# Patient Record
Sex: Female | Born: 1935
Health system: Southern US, Community
[De-identification: ages and names within clinical notes are randomized; demographics above are authoritative.]

## PROBLEM LIST (undated history)

## (undated) DIAGNOSIS — M545 Low back pain, unspecified: Secondary | ICD-10-CM

## (undated) DIAGNOSIS — I10 Essential (primary) hypertension: Secondary | ICD-10-CM

## (undated) DIAGNOSIS — M5412 Radiculopathy, cervical region: Secondary | ICD-10-CM

## (undated) DIAGNOSIS — E669 Obesity, unspecified: Secondary | ICD-10-CM

## (undated) DIAGNOSIS — M51379 Other intervertebral disc degeneration, lumbosacral region without mention of lumbar back pain or lower extremity pain: Secondary | ICD-10-CM

## (undated) DIAGNOSIS — E7800 Pure hypercholesterolemia, unspecified: Secondary | ICD-10-CM

## (undated) DIAGNOSIS — R269 Unspecified abnormalities of gait and mobility: Secondary | ICD-10-CM

## (undated) DIAGNOSIS — E039 Hypothyroidism, unspecified: Secondary | ICD-10-CM

## (undated) DIAGNOSIS — M5137 Other intervertebral disc degeneration, lumbosacral region: Secondary | ICD-10-CM

## (undated) DIAGNOSIS — I499 Cardiac arrhythmia, unspecified: Secondary | ICD-10-CM

## (undated) DIAGNOSIS — M1990 Unspecified osteoarthritis, unspecified site: Secondary | ICD-10-CM

## (undated) HISTORY — DX: Unspecified abnormalities of gait and mobility: R26.9

## (undated) HISTORY — DX: Low back pain: M54.5

## (undated) HISTORY — DX: Unspecified osteoarthritis, unspecified site: M19.90

## (undated) HISTORY — DX: Obesity, unspecified: E66.9

## (undated) HISTORY — DX: Other intervertebral disc degeneration, lumbosacral region without mention of lumbar back pain or lower extremity pain: M51.379

## (undated) HISTORY — DX: Hypothyroidism, unspecified: E03.9

## (undated) HISTORY — DX: Other intervertebral disc degeneration, lumbosacral region: M51.37

## (undated) HISTORY — PX: BACK SURGERY: SHX140

## (undated) HISTORY — DX: Essential (primary) hypertension: I10

## (undated) HISTORY — DX: Radiculopathy, cervical region: M54.12

## (undated) HISTORY — DX: Low back pain, unspecified: M54.50

## (undated) HISTORY — DX: Pure hypercholesterolemia, unspecified: E78.00

---

## 2003-06-02 ENCOUNTER — Emergency Department (HOSPITAL_COMMUNITY): Admission: EM | Admit: 2003-06-02 | Discharge: 2003-06-03 | Payer: Self-pay | Admitting: Emergency Medicine

## 2005-01-13 ENCOUNTER — Ambulatory Visit: Payer: Self-pay

## 2005-02-05 ENCOUNTER — Ambulatory Visit: Payer: Self-pay

## 2005-02-19 ENCOUNTER — Ambulatory Visit: Payer: Self-pay

## 2005-11-05 ENCOUNTER — Ambulatory Visit: Payer: Self-pay | Admitting: Family Medicine

## 2006-04-01 ENCOUNTER — Ambulatory Visit: Payer: Self-pay | Admitting: Family Medicine

## 2007-01-27 ENCOUNTER — Ambulatory Visit: Payer: Self-pay | Admitting: Family Medicine

## 2007-02-10 ENCOUNTER — Ambulatory Visit: Payer: Self-pay | Admitting: Family Medicine

## 2007-04-21 ENCOUNTER — Ambulatory Visit: Payer: Self-pay | Admitting: Specialist

## 2007-05-24 ENCOUNTER — Emergency Department (HOSPITAL_COMMUNITY): Admission: EM | Admit: 2007-05-24 | Discharge: 2007-05-24 | Payer: Self-pay | Admitting: Emergency Medicine

## 2007-06-09 ENCOUNTER — Encounter: Admission: RE | Admit: 2007-06-09 | Discharge: 2007-06-09 | Payer: Self-pay | Admitting: Neurosurgery

## 2007-07-25 ENCOUNTER — Ambulatory Visit (HOSPITAL_COMMUNITY): Admission: RE | Admit: 2007-07-25 | Discharge: 2007-07-26 | Payer: Self-pay | Admitting: Neurosurgery

## 2007-08-04 LAB — HM COLONOSCOPY: HM Colonoscopy: NORMAL

## 2007-09-11 ENCOUNTER — Ambulatory Visit: Payer: Self-pay | Admitting: Family Medicine

## 2008-10-25 ENCOUNTER — Encounter: Admission: RE | Admit: 2008-10-25 | Discharge: 2008-10-25 | Payer: Self-pay | Admitting: Neurosurgery

## 2008-11-04 ENCOUNTER — Encounter: Admission: RE | Admit: 2008-11-04 | Discharge: 2008-11-04 | Payer: Self-pay | Admitting: Neurosurgery

## 2008-12-13 ENCOUNTER — Encounter: Admission: RE | Admit: 2008-12-13 | Discharge: 2008-12-13 | Payer: Self-pay | Admitting: Neurosurgery

## 2008-12-16 ENCOUNTER — Ambulatory Visit: Payer: Self-pay | Admitting: Specialist

## 2008-12-19 ENCOUNTER — Inpatient Hospital Stay: Payer: Self-pay | Admitting: Specialist

## 2009-02-25 ENCOUNTER — Encounter: Admission: RE | Admit: 2009-02-25 | Discharge: 2009-02-25 | Payer: Self-pay | Admitting: Neurosurgery

## 2009-11-14 ENCOUNTER — Encounter: Admission: RE | Admit: 2009-11-14 | Discharge: 2009-11-14 | Payer: Self-pay | Admitting: Neurosurgery

## 2009-11-28 ENCOUNTER — Encounter: Admission: RE | Admit: 2009-11-28 | Discharge: 2009-11-28 | Payer: Self-pay | Admitting: Neurosurgery

## 2009-12-16 LAB — HM PAP SMEAR: HM Pap smear: NORMAL

## 2010-03-13 HISTORY — PX: ABDOMINAL HYSTERECTOMY: SHX81

## 2010-03-13 HISTORY — PX: OTHER SURGICAL HISTORY: SHX169

## 2010-03-13 HISTORY — PX: LUMBAR LAMINECTOMY: SHX95

## 2010-05-12 LAB — HM DEXA SCAN: HM Dexa Scan: NORMAL

## 2010-11-10 NOTE — Op Note (Signed)
Claudia [name], Claudia [name]            ACCOUNT NO.:  [id number]   MEDICAL RECORD NO.:  [id number]          PATIENT TYPE:  INP   LOCATION:  3528                         FACILITY:  [facility]   PHYSICIAN:  Reinaldo Meeker, M.D. DATE OF BIRTH:  16-Jan-1934   DATE OF PROCEDURE:  07/25/2007  DATE OF DISCHARGE:                               OPERATIVE REPORT   PREOPERATIVE DIAGNOSIS:  Spondylosis with lateral recess stenosis, L4-5  bilateral.   POSTOPERATIVE DIAGNOSIS:  Spondylosis with lateral recess stenosis, L4-5  bilateral.   PROCEDURE:  Bilateral L4-5 interlaminar laminotomy for decompression of  L5 nerve roots.   SURGEON:  Reinaldo Meeker, M.D.   ASSISTANT:  Kathaleen Maser. Pool, M.D.   PROCEDURE IN DETAIL:  After being placed in the prone position, the  patient's back was prepped and draped in the usual sterile fashion.  A  localizing x-ray was taken prior to incision to identify the appropriate  level.  A midline incision was made above the spinous processes of L4  and L5.  Using Bovie cutting current, the incision was carried down to  the spinous processes.  Subperiosteal dissection was then carried out  bilaterally on the spinous processes and laminae and a self retractor  was placed for exposure.  X-rays showed approach to the appropriate  level.  Started on the patient's left side, a laminotomy was performed  by removing the inferior one-half of the L4 lamina, the medial third of  the facet joint, and the superior one-third of the L5 lamina.  Residual  bone and hypertrophic ligamentum flavum were removed and the L5 nerve  root was tracked out its foramen generously.  Similar decompression was  then carried out on the patient's right side, once again removing the  inferior one-half of the L4 lamina, the medial one-third of the facet  joint, and the superior one-third of the L5 lamina.  Once again residual  bone and ligamentum flavum were removed in a piecemeal fashion.  Once  again the  L5 nerve root was tracked out its foramen.  At this time  inspection of the disk showed no evidence of herniation and no need for  removal.  Inspection was carried out once more for any evidence of  residual compression and none could be identified.  Some more  interspinous ligament was removed with the ligamentum flavum to help  complete the midline undercutting.  At this time the wound was irrigated  copiously with antibiotic irrigation.  Any bleeding was controlled with  bipolar coagulation and Gelfoam.  The wound was then closed in multiple  layers of Vicryl in the muscle, fascia, subcutaneous and subcuticular  tissues and staples were placed on the skin.  A sterile dressing was  then applied and the patient was extubated and taken to the recovery  room in stable condition.           ______________________________  Reinaldo Meeker, M.D.     ROK/MEDQ  D:  07/25/2007  T:  07/25/2007  Job:  [id number]

## 2010-11-19 ENCOUNTER — Other Ambulatory Visit: Payer: Self-pay | Admitting: Neurosurgery

## 2010-11-19 ENCOUNTER — Ambulatory Visit: Payer: Self-pay | Admitting: Family Medicine

## 2010-11-19 DIAGNOSIS — M545 Low back pain: Secondary | ICD-10-CM

## 2010-11-19 DIAGNOSIS — M542 Cervicalgia: Secondary | ICD-10-CM

## 2010-11-27 ENCOUNTER — Ambulatory Visit
Admission: RE | Admit: 2010-11-27 | Discharge: 2010-11-27 | Disposition: A | Discharge status: Home / Self Care | Payer: 59 | Source: Ambulatory Visit | Attending: Neurosurgery | Admitting: Neurosurgery

## 2010-11-27 DIAGNOSIS — M542 Cervicalgia: Secondary | ICD-10-CM

## 2010-11-27 DIAGNOSIS — M545 Low back pain: Secondary | ICD-10-CM

## 2010-12-09 ENCOUNTER — Ambulatory Visit
Admission: RE | Admit: 2010-12-09 | Discharge: 2010-12-09 | Disposition: A | Discharge status: Home / Self Care | Payer: 59 | Source: Ambulatory Visit | Attending: Neurosurgery | Admitting: Neurosurgery

## 2010-12-09 ENCOUNTER — Other Ambulatory Visit: Payer: Self-pay | Admitting: Neurosurgery

## 2010-12-09 DIAGNOSIS — Z01811 Encounter for preprocedural respiratory examination: Secondary | ICD-10-CM

## 2011-02-08 ENCOUNTER — Encounter (HOSPITAL_COMMUNITY)
Admission: RE | Admit: 2011-02-08 | Discharge: 2011-02-08 | Disposition: A | Discharge status: Home / Self Care | Payer: Medicare PPO | Source: Ambulatory Visit | Attending: Neurosurgery | Admitting: Neurosurgery

## 2011-02-08 ENCOUNTER — Ambulatory Visit (HOSPITAL_COMMUNITY)
Admission: RE | Admit: 2011-02-08 | Discharge: 2011-02-08 | Disposition: A | Discharge status: Home / Self Care | Payer: Medicare PPO | Source: Ambulatory Visit | Attending: Neurosurgery | Admitting: Neurosurgery

## 2011-02-08 ENCOUNTER — Other Ambulatory Visit (HOSPITAL_COMMUNITY): Payer: Self-pay | Admitting: Neurosurgery

## 2011-02-08 DIAGNOSIS — Z01811 Encounter for preprocedural respiratory examination: Secondary | ICD-10-CM

## 2011-02-08 DIAGNOSIS — Z01818 Encounter for other preprocedural examination: Secondary | ICD-10-CM | POA: Insufficient documentation

## 2011-02-08 DIAGNOSIS — Z01812 Encounter for preprocedural laboratory examination: Secondary | ICD-10-CM | POA: Insufficient documentation

## 2011-02-08 DIAGNOSIS — M545 Low back pain, unspecified: Secondary | ICD-10-CM | POA: Insufficient documentation

## 2011-02-08 DIAGNOSIS — I498 Other specified cardiac arrhythmias: Secondary | ICD-10-CM | POA: Insufficient documentation

## 2011-02-08 DIAGNOSIS — Z01810 Encounter for preprocedural cardiovascular examination: Secondary | ICD-10-CM | POA: Insufficient documentation

## 2011-02-08 LAB — BASIC METABOLIC PANEL
BUN: 17 mg/dL (ref 6–23)
CO2: 26 mEq/L (ref 19–32)
Calcium: 10.1 mg/dL (ref 8.4–10.5)
Chloride: 107 mEq/L (ref 96–112)
Creatinine, Ser: 0.92 mg/dL (ref 0.50–1.10)
GFR calc Af Amer: >60 mL/min (ref >60)
GFR calc non Af Amer: 60 mL/min — ABNORMAL LOW (ref >60)
Glucose, Bld: 87 mg/dL (ref 70–99)
Potassium: 5.1 mEq/L (ref 3.5–5.1)
Sodium: 140 mEq/L (ref 135–145)

## 2011-02-08 LAB — CBC
HCT: 36.6 % (ref 36.0–46.0)
Hemoglobin: 11.9 g/dL — ABNORMAL LOW (ref 12.0–15.0)
MCH: 28 pg (ref 26.0–34.0)
MCHC: 32.5 g/dL (ref 30.0–36.0)
MCV: 86.1 fL (ref 78.0–100.0)
Platelets: 182 10*3/uL (ref 150–400)
RBC: 4.25 MIL/uL (ref 3.87–5.11)
RDW: 13.5 % (ref 11.5–15.5)
WBC: 5.2 10*3/uL (ref 4.0–10.5)

## 2011-02-08 LAB — TYPE AND SCREEN
ABO/RH(D): O POS
Antibody Screen: NEGATIVE

## 2011-02-08 LAB — ABO/RH: ABO/RH(D): O POS

## 2011-02-08 LAB — SURGICAL PCR SCREEN
MRSA, PCR: NEGATIVE
Staphylococcus aureus: NEGATIVE

## 2011-02-12 ENCOUNTER — Inpatient Hospital Stay (HOSPITAL_COMMUNITY): Payer: Medicare PPO

## 2011-02-12 ENCOUNTER — Inpatient Hospital Stay (HOSPITAL_COMMUNITY)
Admission: RE | Admit: 2011-02-12 | Discharge: 2011-02-14 | DRG: 460 | Disposition: A | Discharge status: Home / Self Care | Payer: Medicare PPO | Source: Ambulatory Visit | Attending: Neurosurgery | Admitting: Neurosurgery

## 2011-02-12 DIAGNOSIS — Z7982 Long term (current) use of aspirin: Secondary | ICD-10-CM

## 2011-02-12 DIAGNOSIS — I10 Essential (primary) hypertension: Secondary | ICD-10-CM | POA: Diagnosis present

## 2011-02-12 DIAGNOSIS — M545 Low back pain, unspecified: Secondary | ICD-10-CM | POA: Diagnosis present

## 2011-02-12 DIAGNOSIS — Z01812 Encounter for preprocedural laboratory examination: Secondary | ICD-10-CM

## 2011-02-12 DIAGNOSIS — Z01810 Encounter for preprocedural cardiovascular examination: Secondary | ICD-10-CM

## 2011-02-12 DIAGNOSIS — Z01818 Encounter for other preprocedural examination: Secondary | ICD-10-CM

## 2011-02-12 DIAGNOSIS — Q762 Congenital spondylolisthesis: Secondary | ICD-10-CM

## 2011-02-12 DIAGNOSIS — M48061 Spinal stenosis, lumbar region without neurogenic claudication: Principal | ICD-10-CM | POA: Diagnosis present

## 2011-02-12 DIAGNOSIS — I498 Other specified cardiac arrhythmias: Secondary | ICD-10-CM | POA: Diagnosis present

## 2011-03-18 LAB — BASIC METABOLIC PANEL
Calcium: 9.4
GFR calc Af Amer: >60
GFR calc non Af Amer: >60
Sodium: 139

## 2011-03-18 LAB — CBC
Hemoglobin: 12.4
RBC: 4.34
RDW: 14.5

## 2011-03-22 ENCOUNTER — Ambulatory Visit
Admission: RE | Admit: 2011-03-22 | Discharge: 2011-03-22 | Disposition: A | Discharge status: Home / Self Care | Payer: Medicare PPO | Source: Ambulatory Visit | Attending: Neurosurgery | Admitting: Neurosurgery

## 2011-03-22 ENCOUNTER — Other Ambulatory Visit: Payer: Self-pay | Admitting: Neurosurgery

## 2011-03-22 DIAGNOSIS — M533 Sacrococcygeal disorders, not elsewhere classified: Secondary | ICD-10-CM

## 2011-03-22 DIAGNOSIS — M48061 Spinal stenosis, lumbar region without neurogenic claudication: Secondary | ICD-10-CM

## 2011-03-29 NOTE — Op Note (Signed)
Claudia [name], Claudia [name]            ACCOUNT NO.:  [id number]  MEDICAL RECORD NO.:  [id number]  LOCATION:  3004                         FACILITY:  [facility]  PHYSICIAN:  Reinaldo Meeker, M.D. DATE OF BIRTH:  1933-08-18  DATE OF PROCEDURE:  02/12/2011 DATE OF DISCHARGE:                              OPERATIVE REPORT   PREOPERATIVE DIAGNOSIS:  Spondylolisthesis L4-5.  POSTOPERATIVE DIAGNOSIS:  Spondylolisthesis L4-5.  PROCEDURE:  L4-5 anterolateral fusion via right retroperitoneal approach with PEEK interbody spacer followed by bilateral percutaneous pedicle fixation L4-5 with Zimmer pathfinder system.  SURGEON:  Reinaldo Meeker, MD  ASSISTANT:  Kathaleen Maser. Pool, MD  PROCEDURE IN DETAIL:  After placement the right side up lateral decubitus position, the patient was positioned appropriately line with AP and lateral fluoroscopy to identify the L4-5 interspace.  Linear incision was then made in the right flank and carried down through the muscle, but do not into the deep fascia.  We then made another incision posterior to that to allow access in the retroperitoneum.  We gained access without difficulty, swept all concepts, we then turned our finger upward to allow access to more anterior flank incision.  We then passed over the dilator, did a twitch testing, and opt an appropriate spot on the L4-5 disk space.  We then did sequential dilation testing need for intraoperative EMG monitoring that excellent readings with no evidence of any abnormal nerves readings.  We then placed the retractor in a standard fashion to the table via the locking arm.  We then slightly posterior tested once more and got no abnormal readings, left the retractor in this position.  Then, secured the disk space with the shim. We then spent a few minutes coagulated any of residual muscle fibers over the disk and then incised the disk of 15 blade, thoroughly cleaned out with a variety of instruments.  We then did  sequential distraction of the disk space and enough that a 10-mm standard graft, we felt this was a good fit.  It was felt we removed with 22-mm grafts, we placed a 10 x 22 x 55 mm graft.  Filled with a mixture of glue bone and Osteocel Plus and then impacted over the slides without difficulty after irrigating copiously.  We followed in excellent position under fluoroscopy.  We then irrigated copious once more, removed the slides and then I gently with the retractor.  Final fluoroscopy in AP and lateral direction showed excellent.  We then irrigated both wounds and closed then with inverted Vicryl on the subcutaneous subcuticular tissues and placed Dermabond on the skin.  Then placed the patient in the prone position, prepped and draped the back in standard fashion.  We used AP fluoroscopy to identify entry point just lateral to the pedicles of L4-L5 bilaterally.  I passed Jamshidi needle down through without difficulty and then placed guidewires through the needle.  We then connected the two incisions bilaterally to make an one incision on each side.  I did sequential distraction and then tapped each with a 5-mm tap.  Then, placed 6 x 45 mm screws bilaterally at L5.  These were followed excellent position.  We then removed the working cannula and left  the towers on the screws.  We chose 45-mm rods bilaterally, passed them down the towers and then we were able to push them down into the screw heads with the locking top nuts.  We then did final tightening with torque and counter torque.  Then, moves the towers from top of the screws removed them without difficulty bilaterally.  We then took final fluoroscopy AP and lateral direction showed excellent placement of the screws and rods interbody spacer.  We then irrigated both wounds and closed them with inverted Vicryl on the subcutaneous subcuticular tissues and staples on the skin.  Sterile dressings were then applied.  The patient  was extubated and taking to recovery room in stable condition.          ______________________________ Reinaldo Meeker, M.D.     ROK/MEDQ  D:  02/12/2011  T:  02/13/2011  Job:  [id number]  Electronically Signed by Aliene Beams M.D. on 03/29/2011 08:32:30 PM

## 2011-04-16 ENCOUNTER — Inpatient Hospital Stay (HOSPITAL_COMMUNITY): Admission: RE | Admit: 2011-04-16 | Payer: Medicare PPO | Source: Ambulatory Visit | Admitting: Neurosurgery

## 2011-05-03 ENCOUNTER — Other Ambulatory Visit: Payer: Self-pay | Admitting: Neurosurgery

## 2011-05-03 ENCOUNTER — Ambulatory Visit
Admission: RE | Admit: 2011-05-03 | Discharge: 2011-05-03 | Disposition: A | Discharge status: Home / Self Care | Payer: Medicare PPO | Source: Ambulatory Visit | Attending: Neurosurgery | Admitting: Neurosurgery

## 2011-05-03 DIAGNOSIS — M533 Sacrococcygeal disorders, not elsewhere classified: Secondary | ICD-10-CM

## 2011-07-27 ENCOUNTER — Ambulatory Visit: Payer: Medicare Other | Attending: Neurosurgery

## 2011-07-27 DIAGNOSIS — M2560 Stiffness of unspecified joint, not elsewhere classified: Secondary | ICD-10-CM | POA: Insufficient documentation

## 2011-07-27 DIAGNOSIS — M545 Low back pain, unspecified: Secondary | ICD-10-CM | POA: Insufficient documentation

## 2011-07-27 DIAGNOSIS — R262 Difficulty in walking, not elsewhere classified: Secondary | ICD-10-CM | POA: Insufficient documentation

## 2011-07-27 DIAGNOSIS — IMO0001 Reserved for inherently not codable concepts without codable children: Secondary | ICD-10-CM | POA: Insufficient documentation

## 2011-07-29 ENCOUNTER — Ambulatory Visit: Payer: Medicare Other

## 2011-08-02 ENCOUNTER — Ambulatory Visit: Payer: Medicare Other | Attending: Neurosurgery

## 2011-08-02 DIAGNOSIS — IMO0001 Reserved for inherently not codable concepts without codable children: Secondary | ICD-10-CM | POA: Insufficient documentation

## 2011-08-02 DIAGNOSIS — M2560 Stiffness of unspecified joint, not elsewhere classified: Secondary | ICD-10-CM | POA: Insufficient documentation

## 2011-08-02 DIAGNOSIS — M545 Low back pain, unspecified: Secondary | ICD-10-CM | POA: Insufficient documentation

## 2011-08-02 DIAGNOSIS — R262 Difficulty in walking, not elsewhere classified: Secondary | ICD-10-CM | POA: Insufficient documentation

## 2011-08-04 ENCOUNTER — Encounter: Payer: Self-pay | Admitting: Internal Medicine

## 2011-08-04 ENCOUNTER — Ambulatory Visit (INDEPENDENT_AMBULATORY_CARE_PROVIDER_SITE_OTHER): Payer: Medicare Other | Admitting: Internal Medicine

## 2011-08-04 VITALS — BP 138/76 | HR 54 | Temp 97.4°F | Ht 70.0 in | Wt 239.0 lb

## 2011-08-04 DIAGNOSIS — M109 Gout, unspecified: Secondary | ICD-10-CM

## 2011-08-04 DIAGNOSIS — I10 Essential (primary) hypertension: Secondary | ICD-10-CM

## 2011-08-04 DIAGNOSIS — M179 Osteoarthritis of knee, unspecified: Secondary | ICD-10-CM

## 2011-08-04 DIAGNOSIS — M1710 Unilateral primary osteoarthritis, unspecified knee: Secondary | ICD-10-CM

## 2011-08-04 DIAGNOSIS — E039 Hypothyroidism, unspecified: Secondary | ICD-10-CM

## 2011-08-04 DIAGNOSIS — G609 Hereditary and idiopathic neuropathy, unspecified: Secondary | ICD-10-CM

## 2011-08-04 DIAGNOSIS — G629 Polyneuropathy, unspecified: Secondary | ICD-10-CM

## 2011-08-04 DIAGNOSIS — IMO0002 Reserved for concepts with insufficient information to code with codable children: Secondary | ICD-10-CM

## 2011-08-04 DIAGNOSIS — Z1231 Encounter for screening mammogram for malignant neoplasm of breast: Secondary | ICD-10-CM

## 2011-08-04 DIAGNOSIS — E7800 Pure hypercholesterolemia, unspecified: Secondary | ICD-10-CM

## 2011-08-04 MED ORDER — TORSEMIDE 20 MG PO TABS: 20.0000 mg | ORAL_TABLET | Freq: Every day | ORAL | Status: DC

## 2011-08-04 MED ORDER — QUINAPRIL HCL 40 MG PO TABS: 40.0000 mg | ORAL_TABLET | Freq: Every day | ORAL | Status: DC

## 2011-08-04 MED ORDER — HYDROCODONE-ACETAMINOPHEN 5-500 MG PO TABS: 1.0000 | ORAL_TABLET | Freq: Three times a day (TID) | ORAL | Status: DC | PRN

## 2011-08-04 MED ORDER — ATORVASTATIN CALCIUM 40 MG PO TABS: 40.0000 mg | ORAL_TABLET | Freq: Every day | ORAL | Status: DC

## 2011-08-04 MED ORDER — ALLOPURINOL 300 MG PO TABS: 300.0000 mg | ORAL_TABLET | Freq: Every day | ORAL | Status: DC

## 2011-08-04 NOTE — Progress Notes (Signed)
Subjective:    Patient ID: Claudia [name], female    DOB: [birth year], 78 y.o.   MRN: [id number]  Hypertension This is a chronic problem. The current episode started more than 1 year ago. The problem has been gradually improving since onset. The problem is controlled. Pertinent negatives include no anxiety, blurred vision, chest pain, headaches, malaise/fatigue, neck pain, orthopnea, palpitations, peripheral edema, PND or shortness of breath. There are no associated agents to hypertension. Past treatments include ACE inhibitors and diuretics. The current treatment provides significant improvement. Compliance problems include exercise and diet.  Hypertensive end-organ damage includes a thyroid problem. There is no history of chronic renal disease.  Hyperlipidemia This is a chronic problem. The current episode started more than 1 year ago. The problem is controlled. Recent lipid tests were reviewed and are variable. Exacerbating diseases include hypothyroidism and obesity. She has no history of chronic renal disease, diabetes, liver disease or nephrotic syndrome. Factors aggravating her hyperlipidemia include no known factors. Pertinent negatives include no chest pain, focal sensory loss, focal weakness, leg pain, myalgias or shortness of breath. Current antihyperlipidemic treatment includes statins. The current treatment provides moderate improvement of lipids. Compliance problems include adherence to exercise and adherence to diet.       Review of Systems  Constitutional: Negative for fever, chills, malaise/fatigue, diaphoresis, activity change, appetite change, fatigue and unexpected weight change.  HENT: Negative.  Negative for neck pain.   Eyes: Negative.  Negative for blurred vision.  Respiratory: Negative for apnea, cough, choking, chest tightness, shortness of breath, wheezing and stridor.   Cardiovascular: Negative for chest pain, palpitations, orthopnea, leg swelling and PND.    Gastrointestinal: Negative for nausea, vomiting, abdominal pain, diarrhea, constipation, blood in stool, abdominal distention, anal bleeding and rectal pain.  Genitourinary: Negative.   Musculoskeletal: Positive for arthralgias (both knees). Negative for myalgias, back pain, joint swelling and gait problem.  Skin: Negative for color change, pallor, rash and wound.  Neurological: Positive for numbness (and tingling on the bottoms of both feet for one year). Negative for dizziness, tremors, focal weakness, seizures, syncope, facial asymmetry, speech difficulty, weakness, light-headedness and headaches.  Hematological: Negative for adenopathy. Does not bruise/bleed easily.  Psychiatric/Behavioral: Negative.        Objective:   Physical Exam  Vitals reviewed. Constitutional: She is oriented to person, place, and time. She appears well-developed and well-nourished. No distress.  HENT:  Head: Normocephalic and atraumatic.  Mouth/Throat: Oropharynx is clear and moist. No oropharyngeal exudate.  Eyes: Conjunctivae are normal. Right eye exhibits no discharge. Left eye exhibits no discharge. No scleral icterus.  Neck: Normal range of motion. Neck supple. No JVD present. No tracheal deviation present. No thyromegaly present.  Cardiovascular: Normal rate, regular rhythm, normal heart sounds and intact distal pulses.  Exam reveals no gallop and no friction rub.   No murmur heard. Pulmonary/Chest: Effort normal and breath sounds normal. No stridor. No respiratory distress. She has no wheezes. She has no rales. She exhibits no tenderness.  Abdominal: Soft. Bowel sounds are normal. She exhibits no distension and no mass. There is no tenderness. There is no rebound and no guarding.  Musculoskeletal: Normal range of motion. She exhibits no edema and no tenderness.  Lymphadenopathy:    She has no cervical adenopathy.  Neurological: She is alert and oriented to person, place, and time. She has normal  strength. She displays no atrophy, no tremor and normal reflexes. No cranial nerve deficit or sensory deficit. She exhibits normal muscle tone. She  displays a negative Romberg sign. She displays no seizure activity. Coordination and gait normal. She displays no Babinski's sign on the right side. She displays no Babinski's sign on the left side.  Reflex Scores:      Tricep reflexes are 1+ on the right side and 1+ on the left side.      Bicep reflexes are 1+ on the right side and 1+ on the left side.      Brachioradialis reflexes are 1+ on the right side and 1+ on the left side.      Patellar reflexes are 0 on the right side and 0 on the left side.      Achilles reflexes are 0 on the right side and 0 on the left side. Skin: Skin is warm and dry. No rash noted. She is not diaphoretic. No erythema. No pallor.  Psychiatric: She has a normal mood and affect. Her behavior is normal. Judgment and thought content normal.          Assessment & Plan:

## 2011-08-04 NOTE — Assessment & Plan Note (Signed)
Check her TSH level today 

## 2011-08-04 NOTE — Assessment & Plan Note (Signed)
Continue current pain meds and PT

## 2011-08-04 NOTE — Assessment & Plan Note (Signed)
BP is well controlled, I will check her lytes and renal function today 

## 2011-08-04 NOTE — Assessment & Plan Note (Signed)
Labs ordered to look for metabolic and systemic causes, she may need to her a NCS/EMG done

## 2011-08-04 NOTE — Patient Instructions (Signed)
Hypertension As your heart beats, it forces blood through your arteries. This force is your blood pressure. If the pressure is too high, it is called hypertension (HTN) or high blood pressure. HTN is dangerous because you may have it and not know it. High blood pressure may mean that your heart has to work harder to pump blood. Your arteries may be narrow or stiff. The extra work puts you at risk for heart disease, stroke, and other problems.  Blood pressure consists of two numbers, a higher number over a lower, 110/72, for example. It is stated as "110 over 72." The ideal is below 120 for the top number (systolic) and under 80 for the bottom (diastolic). Write down your blood pressure today. You should pay close attention to your blood pressure if you have certain conditions such as:  Heart failure.   Prior heart attack.   Diabetes   Chronic kidney disease.   Prior stroke.   Multiple risk factors for heart disease.  To see if you have HTN, your blood pressure should be measured while you are seated with your arm held at the level of the heart. It should be measured at least twice. A one-time elevated blood pressure reading (especially in the Emergency Department) does not mean that you need treatment. There may be conditions in which the blood pressure is different between your right and left arms. It is important to see your caregiver soon for a recheck. Most people have essential hypertension which means that there is not a specific cause. This type of high blood pressure may be lowered by changing lifestyle factors such as:  Stress.   Smoking.   Lack of exercise.   Excessive weight.   Drug/tobacco/alcohol use.   Eating less salt.  Most people do not have symptoms from high blood pressure until it has caused damage to the body. Effective treatment can often prevent, delay or reduce that damage. TREATMENT  When a cause has been identified, treatment for high blood pressure is  directed at the cause. There are a large number of medications to treat HTN. These fall into several categories, and your caregiver will help you select the medicines that are best for you. Medications may have side effects. You should review side effects with your caregiver. If your blood pressure stays high after you have made lifestyle changes or started on medicines,   Your medication(s) may need to be changed.   Other problems may need to be addressed.   Be certain you understand your prescriptions, and know how and when to take your medicine.   Be sure to follow up with your caregiver within the time frame advised (usually within two weeks) to have your blood pressure rechecked and to review your medications.   If you are taking more than one medicine to lower your blood pressure, make sure you know how and at what times they should be taken. Taking two medicines at the same time can result in blood pressure that is too low.  SEEK IMMEDIATE MEDICAL CARE IF:  You develop a severe headache, blurred or changing vision, or confusion.   You have unusual weakness or numbness, or a faint feeling.   You have severe chest or abdominal pain, vomiting, or breathing problems.  MAKE SURE YOU:   Understand these instructions.   Will watch your condition.   Will get help right away if you are not doing well or get worse.  Document Released: 06/14/2005 Document Revised: 02/24/2011 Document Reviewed:   02/02/2008 [employer] Patient Information 2012 [place], [place].Neuropathy Neuropathy means your peripheral nerves are not working normally. Peripheral nerves are the nerves outside the brain and spinal cord. Messages between the brain and the rest of the body do not work properly with peripheral nerve disorders. CAUSES There are many different causes of peripheral nerve disorders. These include:  Injury.   Infections.   Diabetes.   Vitamin deficiency.   Poor circulation.   Alcoholism.    Exposure to toxins.   Drug effects.   Tumors.   Kidney disease.  SYMPTOMS  Tingling, burning, pain, and numbness in the extremities.   Weakness and loss of muscle tone and size.  DIAGNOSIS Blood tests and special studies of nerve function may help confirm the diagnosis.  TREATMENT  Treatment includes adopting healthy life habits.   A good diet, vitamin supplements, and mild pain medicine may be needed.   Avoid known toxins such as alcohol, tobacco, and recreational drugs.   Anti-convulsant medicines are helpful in some types of neuropathy.  Make a follow-up appointment with your caregiver to be sure you are getting better with treatment.  SEEK IMMEDIATE MEDICAL CARE IF:   You have breathing problems.   You have severe or uncontrolled pain.   You notice extreme weakness or you feel faint.   You are not better after 1 week or if you have worse symptoms.  Document Released: 07/22/2004 Document Revised: 02/24/2011 Document Reviewed: 06/14/2005 [facility] Patient Information 2012 [place], [place].

## 2011-08-04 NOTE — Assessment & Plan Note (Signed)
Continue statin, check FLP and CMP today

## 2011-08-04 NOTE — Assessment & Plan Note (Signed)
Check her renal function and UA level today

## 2011-08-05 ENCOUNTER — Ambulatory Visit: Payer: Medicare Other

## 2011-08-10 ENCOUNTER — Ambulatory Visit: Payer: Medicare Other

## 2011-08-10 LAB — CBC AND DIFFERENTIAL
Hemoglobin: 12 g/dL (ref 12.0–16.0)
WBC: 4.8 10^3/mL

## 2011-08-10 LAB — HEPATIC FUNCTION PANEL
AST: 17 U/L (ref 13–35)
Alkaline Phosphatase: 99 U/L (ref 25–125)
Bilirubin, Total: 0.7 mg/dL

## 2011-08-10 LAB — POCT ERYTHROCYTE SEDIMENTATION RATE, NON-AUTOMATED

## 2011-08-10 LAB — C-REACTIVE PROTEIN: CRP: 1.1 mg/dL

## 2011-08-10 LAB — BASIC METABOLIC PANEL
BUN: 22 mg/dL — AB (ref 4–21)
Glucose: 81 mg/dL

## 2011-08-10 LAB — LIPID PANEL: LDL Cholesterol: 71 mg/dL

## 2011-08-12 ENCOUNTER — Ambulatory Visit: Payer: Medicare Other

## 2011-08-17 ENCOUNTER — Ambulatory Visit: Payer: Medicare Other

## 2011-08-17 ENCOUNTER — Telehealth: Payer: Self-pay | Admitting: Internal Medicine

## 2011-08-17 DIAGNOSIS — E7800 Pure hypercholesterolemia, unspecified: Secondary | ICD-10-CM

## 2011-08-17 DIAGNOSIS — I10 Essential (primary) hypertension: Secondary | ICD-10-CM

## 2011-08-17 DIAGNOSIS — M1710 Unilateral primary osteoarthritis, unspecified knee: Secondary | ICD-10-CM

## 2011-08-17 NOTE — Telephone Encounter (Signed)
The pt states she needs all her meds refilled through [facility].    Thanks!

## 2011-08-19 ENCOUNTER — Ambulatory Visit: Payer: Medicare Other

## 2011-08-19 ENCOUNTER — Telehealth: Payer: Self-pay

## 2011-08-19 MED ORDER — ALLOPURINOL 300 MG PO TABS: 300.0000 mg | ORAL_TABLET | Freq: Every day | ORAL | Status: DC

## 2011-08-19 MED ORDER — QUINAPRIL HCL 40 MG PO TABS: 40.0000 mg | ORAL_TABLET | Freq: Every day | ORAL | Status: DC

## 2011-08-19 MED ORDER — ATORVASTATIN CALCIUM 40 MG PO TABS: 40.0000 mg | ORAL_TABLET | Freq: Every day | ORAL | Status: DC

## 2011-08-19 NOTE — Telephone Encounter (Signed)
RX faxed

## 2011-08-19 NOTE — Telephone Encounter (Signed)
Pharmacy notified via fax to [phone]

## 2011-08-19 NOTE — Telephone Encounter (Signed)
Yes, they can change to the new formulation

## 2011-08-19 NOTE — Telephone Encounter (Signed)
Received fax from [facility] requesting clarification on rx for hydrocodone 5-500mg . They state that the vicodin formulation has changed to 5-300mg . They would like to know if ok to change to new strength or does MD want hydrocodone 5-500 (generic lortab). Please advise

## 2011-08-23 ENCOUNTER — Ambulatory Visit: Payer: Medicare Other

## 2011-08-25 ENCOUNTER — Telehealth: Payer: Self-pay

## 2011-08-25 NOTE — Telephone Encounter (Signed)
Pennie Rushing with [facility] called LMOV[facility] requesting a call back needing diagnosis codes. Returned call back and lmovm.

## 2011-08-26 ENCOUNTER — Ambulatory Visit: Payer: Medicare Other

## 2011-08-26 NOTE — Telephone Encounter (Signed)
Returned call to [employer] [facility] x 2//LMOVM to call back

## 2011-08-26 NOTE — Telephone Encounter (Signed)
Spoke with Pennie Rushing and provided additional dx for labs

## 2011-08-30 ENCOUNTER — Ambulatory Visit: Payer: Medicare Other | Attending: Neurosurgery

## 2011-08-30 DIAGNOSIS — M545 Low back pain, unspecified: Secondary | ICD-10-CM | POA: Insufficient documentation

## 2011-08-30 DIAGNOSIS — M2560 Stiffness of unspecified joint, not elsewhere classified: Secondary | ICD-10-CM | POA: Insufficient documentation

## 2011-08-30 DIAGNOSIS — IMO0001 Reserved for inherently not codable concepts without codable children: Secondary | ICD-10-CM | POA: Insufficient documentation

## 2011-08-30 DIAGNOSIS — R262 Difficulty in walking, not elsewhere classified: Secondary | ICD-10-CM | POA: Insufficient documentation

## 2011-09-02 ENCOUNTER — Ambulatory Visit: Payer: Medicare Other

## 2011-09-07 ENCOUNTER — Ambulatory Visit: Payer: Medicare Other

## 2011-09-09 ENCOUNTER — Ambulatory Visit: Payer: Medicare Other

## 2011-09-13 ENCOUNTER — Ambulatory Visit: Payer: Medicare Other

## 2011-09-22 ENCOUNTER — Ambulatory Visit: Payer: Medicare Other

## 2011-09-23 ENCOUNTER — Encounter: Payer: Medicare Other | Admitting: Physical Therapy

## 2011-10-01 ENCOUNTER — Encounter: Payer: Self-pay | Admitting: Internal Medicine

## 2011-10-01 ENCOUNTER — Ambulatory Visit (INDEPENDENT_AMBULATORY_CARE_PROVIDER_SITE_OTHER): Payer: Medicare Other | Admitting: Internal Medicine

## 2011-10-01 VITALS — BP 114/74 | HR 64 | Temp 97.0°F | Resp 16 | Wt 235.0 lb

## 2011-10-01 DIAGNOSIS — M545 Low back pain, unspecified: Secondary | ICD-10-CM

## 2011-10-01 DIAGNOSIS — M1710 Unilateral primary osteoarthritis, unspecified knee: Secondary | ICD-10-CM

## 2011-10-01 DIAGNOSIS — M109 Gout, unspecified: Secondary | ICD-10-CM

## 2011-10-01 DIAGNOSIS — G609 Hereditary and idiopathic neuropathy, unspecified: Secondary | ICD-10-CM

## 2011-10-01 DIAGNOSIS — D649 Anemia, unspecified: Secondary | ICD-10-CM

## 2011-10-01 DIAGNOSIS — I10 Essential (primary) hypertension: Secondary | ICD-10-CM

## 2011-10-01 DIAGNOSIS — E039 Hypothyroidism, unspecified: Secondary | ICD-10-CM

## 2011-10-01 DIAGNOSIS — E7800 Pure hypercholesterolemia, unspecified: Secondary | ICD-10-CM

## 2011-10-01 DIAGNOSIS — G629 Polyneuropathy, unspecified: Secondary | ICD-10-CM

## 2011-10-01 DIAGNOSIS — IMO0002 Reserved for concepts with insufficient information to code with codable children: Secondary | ICD-10-CM

## 2011-10-01 MED ORDER — ALLOPURINOL 300 MG PO TABS: 300.0000 mg | ORAL_TABLET | Freq: Every day | ORAL | Status: DC

## 2011-10-01 MED ORDER — ATORVASTATIN CALCIUM 40 MG PO TABS: 40.0000 mg | ORAL_TABLET | Freq: Every day | ORAL | Status: DC

## 2011-10-01 MED ORDER — TORSEMIDE 20 MG PO TABS: 20.0000 mg | ORAL_TABLET | Freq: Every day | ORAL | Status: DC

## 2011-10-01 MED ORDER — QUINAPRIL HCL 40 MG PO TABS: 40.0000 mg | ORAL_TABLET | Freq: Every day | ORAL | Status: DC

## 2011-10-01 MED ORDER — HYDROCODONE-ACETAMINOPHEN 5-500 MG PO TABS: 1.0000 | ORAL_TABLET | Freq: Three times a day (TID) | ORAL | Status: DC | PRN

## 2011-10-01 NOTE — Progress Notes (Signed)
  Subjective:    Patient ID: Claudia [name], female    DOB: Oct 30, 1933, 78 y.o.   MRN: [id number]  Hypertension This is a chronic problem. The current episode started more than 1 year ago. The problem has been gradually improving since onset. The problem is controlled. Pertinent negatives include no anxiety, blurred vision, chest pain, headaches, malaise/fatigue, neck pain, orthopnea, palpitations, peripheral edema, PND, shortness of breath or sweats. There are no associated agents to hypertension. Past treatments include ACE inhibitors and diuretics. The current treatment provides significant improvement. Compliance problems include exercise and diet.       Review of Systems  Constitutional: Negative for fever, chills, malaise/fatigue, diaphoresis, activity change, appetite change, fatigue and unexpected weight change.  HENT: Negative.  Negative for neck pain.   Eyes: Negative.  Negative for blurred vision.  Respiratory: Negative for apnea, cough, choking, chest tightness, shortness of breath, wheezing and stridor.   Cardiovascular: Negative for chest pain, palpitations, orthopnea, leg swelling and PND.  Gastrointestinal: Negative for nausea, abdominal pain, diarrhea, constipation, blood in stool, abdominal distention, anal bleeding and rectal pain.  Genitourinary: Negative.   Musculoskeletal: Positive for back pain (chronic,unchanged) and arthralgias (left knee, chronic/unchanged). Negative for myalgias, joint swelling and gait problem.  Neurological: Negative for dizziness, tremors, seizures, syncope, facial asymmetry, speech difficulty, weakness, light-headedness, numbness and headaches.  Hematological: Negative for adenopathy. Does not bruise/bleed easily.  Psychiatric/Behavioral: Negative.        Objective:   Physical Exam  Vitals reviewed. Constitutional: She is oriented to person, place, and time. She appears well-developed and well-nourished. No distress.  HENT:  Head:  Normocephalic and atraumatic.  Mouth/Throat: Oropharynx is clear and moist. No oropharyngeal exudate.  Eyes: Conjunctivae are normal. Right eye exhibits no discharge. Left eye exhibits no discharge. No scleral icterus.  Neck: Normal range of motion. Neck supple. No JVD present. No tracheal deviation present. No thyromegaly present.  Cardiovascular: Normal rate, regular rhythm, normal heart sounds and intact distal pulses.  Exam reveals no gallop and no friction rub.   No murmur heard. Pulmonary/Chest: Effort normal and breath sounds normal. No stridor. No respiratory distress. She has no wheezes. She has no rales. She exhibits no tenderness.  Abdominal: Soft. Bowel sounds are normal. She exhibits no distension and no mass. There is no tenderness. There is no rebound and no guarding.  Musculoskeletal: Normal range of motion. She exhibits no edema and no tenderness.  Lymphadenopathy:    She has no cervical adenopathy.  Neurological: She is oriented to person, place, and time.  Skin: Skin is warm and dry. No rash noted. She is not diaphoretic. No erythema. No pallor.  Psychiatric: She has a normal mood and affect. Her behavior is normal. Judgment and thought content normal.      Lab Results  Component Value Date   WBC 5.2 02/08/2011   HGB 11.9* 02/08/2011   HCT 36.6 02/08/2011   PLT 182 02/08/2011   GLUCOSE 87 02/08/2011   NA 140 02/08/2011   K 5.1 02/08/2011   CL 107 02/08/2011   CREATININE 0.92 02/08/2011   BUN 17 02/08/2011   CO2 26 02/08/2011      Assessment & Plan:

## 2011-10-01 NOTE — Patient Instructions (Signed)
Hypertension As your heart beats, it forces blood through your arteries. This force is your blood pressure. If the pressure is too high, it is called hypertension (HTN) or high blood pressure. HTN is dangerous because you may have it and not know it. High blood pressure may mean that your heart has to work harder to pump blood. Your arteries may be narrow or stiff. The extra work puts you at risk for heart disease, stroke, and other problems.  Blood pressure consists of two numbers, a higher number over a lower, 110/72, for example. It is stated as "110 over 72." The ideal is below 120 for the top number (systolic) and under 80 for the bottom (diastolic). Write down your blood pressure today. You should pay close attention to your blood pressure if you have certain conditions such as:  Heart failure.   Prior heart attack.   Diabetes   Chronic kidney disease.   Prior stroke.   Multiple risk factors for heart disease.  To see if you have HTN, your blood pressure should be measured while you are seated with your arm held at the level of the heart. It should be measured at least twice. A one-time elevated blood pressure reading (especially in the Emergency Department) does not mean that you need treatment. There may be conditions in which the blood pressure is different between your right and left arms. It is important to see your caregiver soon for a recheck. Most people have essential hypertension which means that there is not a specific cause. This type of high blood pressure may be lowered by changing lifestyle factors such as:  Stress.   Smoking.   Lack of exercise.   Excessive weight.   Drug/tobacco/alcohol use.   Eating less salt.  Most people do not have symptoms from high blood pressure until it has caused damage to the body. Effective treatment can often prevent, delay or reduce that damage. TREATMENT  When a cause has been identified, treatment for high blood pressure is  directed at the cause. There are a large number of medications to treat HTN. These fall into several categories, and your caregiver will help you select the medicines that are best for you. Medications may have side effects. You should review side effects with your caregiver. If your blood pressure stays high after you have made lifestyle changes or started on medicines,   Your medication(s) may need to be changed.   Other problems may need to be addressed.   Be certain you understand your prescriptions, and know how and when to take your medicine.   Be sure to follow up with your caregiver within the time frame advised (usually within two weeks) to have your blood pressure rechecked and to review your medications.   If you are taking more than one medicine to lower your blood pressure, make sure you know how and at what times they should be taken. Taking two medicines at the same time can result in blood pressure that is too low.  SEEK IMMEDIATE MEDICAL CARE IF:  You develop a severe headache, blurred or changing vision, or confusion.   You have unusual weakness or numbness, or a faint feeling.   You have severe chest or abdominal pain, vomiting, or breathing problems.  MAKE SURE YOU:   Understand these instructions.   Will watch your condition.   Will get help right away if you are not doing well or get worse.  Document Released: 06/14/2005 Document Revised: 06/03/2011 Document Reviewed:   02/02/2008 [employer] Patient Information 2012 [employer].Back Pain, Adult Low back pain is very common. About 1 in 5 people have back pain.The cause of low back pain is rarely dangerous. The pain often gets better over time.About half of people with a sudden onset of back pain feel better in just 2 weeks. About 8 in 10 people feel better by 6 weeks.  CAUSES Some common causes of back pain include:  Strain of the muscles or ligaments supporting the spine.   Wear and tear (degeneration) of the  spinal discs.   Arthritis.   Direct injury to the back.  DIAGNOSIS Most of the time, the direct cause of low back pain is not known.However, back pain can be treated effectively even when the exact cause of the pain is unknown.Answering your caregiver's questions about your overall health and symptoms is one of the most accurate ways to make sure the cause of your pain is not dangerous. If your caregiver needs more information, he or she may order lab work or imaging tests (X-rays or MRIs).However, even if imaging tests show changes in your back, this usually does not require surgery. HOME CARE INSTRUCTIONS For many people, back pain returns.Since low back pain is rarely dangerous, it is often a condition that people can learn to manageon their own.   Remain active. It is stressful on the back to sit or stand in one place. Do not sit, drive, or stand in one place for more than 30 minutes at a time. Take short walks on level surfaces as soon as pain allows.Try to increase the length of time you walk each day.   Do not stay in bed.Resting more than 1 or 2 days can delay your recovery.   Do not avoid exercise or work.Your body is made to move.It is not dangerous to be active, even though your back may hurt.Your back will likely heal faster if you return to being active before your pain is gone.   Pay attention to your body when you bend and lift. Many people have less discomfortwhen lifting if they bend their knees, keep the load close to their bodies,and avoid twisting. Often, the most comfortable positions are those that put less stress on your recovering back.   Find a comfortable position to sleep. Use a firm mattress and lie on your side with your knees slightly bent. If you lie on your back, put a pillow under your knees.   Only take over-the-counter or prescription medicines as directed by your caregiver. Over-the-counter medicines to reduce pain and inflammation are often the  most helpful.Your caregiver may prescribe muscle relaxant drugs.These medicines help dull your pain so you can more quickly return to your normal activities and healthy exercise.   Put ice on the injured area.   Put ice in a plastic bag.   Place a towel between your skin and the bag.   Leave the ice on for 15 to 20 minutes, 3 to 4 times a day for the first 2 to 3 days. After that, ice and heat may be alternated to reduce pain and spasms.   Ask your caregiver about trying back exercises and gentle massage. This may be of some benefit.   Avoid feeling anxious or stressed.Stress increases muscle tension and can worsen back pain.It is important to recognize when you are anxious or stressed and learn ways to manage it.Exercise is a great option.  SEEK MEDICAL CARE IF:  You have pain that is not relieved with   rest or medicine.   You have pain that does not improve in 1 week.   You have new symptoms.   You are generally not feeling well.  SEEK IMMEDIATE MEDICAL CARE IF:   You have pain that radiates from your back into your legs.   You develop new bowel or bladder control problems.   You have unusual weakness or numbness in your arms or legs.   You develop nausea or vomiting.   You develop abdominal pain.   You feel faint.  Document Released: 06/14/2005 Document Revised: 06/03/2011 Document Reviewed: 11/02/2010 [employer] Patient Information 2012 [employer]. 

## 2011-10-07 NOTE — Assessment & Plan Note (Signed)
Recent TSH was normal 

## 2011-10-07 NOTE — Assessment & Plan Note (Signed)
Her BP is well controlled 

## 2011-10-07 NOTE — Assessment & Plan Note (Signed)
She is doing well in lipitor 

## 2011-10-08 ENCOUNTER — Encounter: Payer: Self-pay | Admitting: Internal Medicine

## 2011-10-08 LAB — COMPREHENSIVE METABOLIC PANEL
Albumin: 4
Chloride: 106 mmol/L
EGFR: 75 mg/dL
Globulin: 2.2
Total Protein: 6.2 g/dL

## 2011-10-08 LAB — PROTEIN ELECTROPHORESIS, SERUM
Albumin: 4
Alpha-1-Globulin: 0.2
Alpha-2-Globulin: 0.6
Beta Globulin: 0.9
Gamma Globulin: 0.6

## 2011-10-08 LAB — URINALYSIS, COMPLETE
Bilirubin (Urine): NEGATIVE
Glucose, Ur: NEGATIVE
Occult Blood: NEGATIVE
Urobilinogen, UA: NORMAL

## 2011-10-08 LAB — CBC WITH DIFFERENTIAL/PLATELET
MCH: 28.2
MCHC: 32.9
Monocytes: 8
RDW: 13.4
lymph#: 41

## 2011-10-08 LAB — URIC ACID: Uric Acid, Serum: 4.7

## 2011-10-08 LAB — VITAMIN B12: Vit D, 25-Hydroxy: 23.5

## 2011-10-22 ENCOUNTER — Ambulatory Visit (HOSPITAL_COMMUNITY)
Admission: RE | Admit: 2011-10-22 | Discharge: 2011-10-22 | Disposition: A | Discharge status: Home / Self Care | Payer: Medicare Other | Source: Ambulatory Visit | Attending: Internal Medicine | Admitting: Internal Medicine

## 2011-10-22 DIAGNOSIS — E039 Hypothyroidism, unspecified: Secondary | ICD-10-CM

## 2011-10-22 DIAGNOSIS — G629 Polyneuropathy, unspecified: Secondary | ICD-10-CM

## 2011-10-22 DIAGNOSIS — E7800 Pure hypercholesterolemia, unspecified: Secondary | ICD-10-CM

## 2011-10-22 DIAGNOSIS — M1710 Unilateral primary osteoarthritis, unspecified knee: Secondary | ICD-10-CM

## 2011-10-22 DIAGNOSIS — I10 Essential (primary) hypertension: Secondary | ICD-10-CM

## 2011-10-22 DIAGNOSIS — Z1231 Encounter for screening mammogram for malignant neoplasm of breast: Secondary | ICD-10-CM | POA: Insufficient documentation

## 2011-10-27 ENCOUNTER — Other Ambulatory Visit: Payer: Self-pay | Admitting: Internal Medicine

## 2011-10-27 DIAGNOSIS — R928 Other abnormal and inconclusive findings on diagnostic imaging of breast: Secondary | ICD-10-CM

## 2011-10-29 ENCOUNTER — Ambulatory Visit
Admission: RE | Admit: 2011-10-29 | Discharge: 2011-10-29 | Disposition: A | Discharge status: Home / Self Care | Payer: Medicare Other | Source: Ambulatory Visit | Attending: Internal Medicine | Admitting: Internal Medicine

## 2011-10-29 DIAGNOSIS — R928 Other abnormal and inconclusive findings on diagnostic imaging of breast: Secondary | ICD-10-CM

## 2011-10-29 LAB — HM MAMMOGRAPHY: HM Mammogram: NORMAL

## 2012-01-11 ENCOUNTER — Other Ambulatory Visit: Payer: Self-pay | Admitting: Internal Medicine

## 2012-02-01 ENCOUNTER — Other Ambulatory Visit (INDEPENDENT_AMBULATORY_CARE_PROVIDER_SITE_OTHER): Payer: Medicare Other

## 2012-02-01 ENCOUNTER — Ambulatory Visit (INDEPENDENT_AMBULATORY_CARE_PROVIDER_SITE_OTHER): Payer: Medicare Other | Admitting: Internal Medicine

## 2012-02-01 ENCOUNTER — Encounter: Payer: Self-pay | Admitting: Internal Medicine

## 2012-02-01 VITALS — BP 116/68 | HR 62 | Temp 97.8°F | Resp 14 | Wt 238.0 lb

## 2012-02-01 DIAGNOSIS — D649 Anemia, unspecified: Secondary | ICD-10-CM

## 2012-02-01 DIAGNOSIS — E039 Hypothyroidism, unspecified: Secondary | ICD-10-CM

## 2012-02-01 DIAGNOSIS — E7800 Pure hypercholesterolemia, unspecified: Secondary | ICD-10-CM

## 2012-02-01 DIAGNOSIS — M545 Low back pain, unspecified: Secondary | ICD-10-CM

## 2012-02-01 DIAGNOSIS — M1710 Unilateral primary osteoarthritis, unspecified knee: Secondary | ICD-10-CM

## 2012-02-01 DIAGNOSIS — I10 Essential (primary) hypertension: Secondary | ICD-10-CM

## 2012-02-01 DIAGNOSIS — Z5181 Encounter for therapeutic drug level monitoring: Secondary | ICD-10-CM | POA: Insufficient documentation

## 2012-02-01 DIAGNOSIS — IMO0002 Reserved for concepts with insufficient information to code with codable children: Secondary | ICD-10-CM

## 2012-02-01 DIAGNOSIS — G629 Polyneuropathy, unspecified: Secondary | ICD-10-CM

## 2012-02-01 DIAGNOSIS — Z0000 Encounter for general adult medical examination without abnormal findings: Secondary | ICD-10-CM

## 2012-02-01 LAB — CBC WITH DIFFERENTIAL/PLATELET
Basophils Absolute: 0 10*3/uL (ref 0.0–0.1)
Eosinophils Relative: 7 % — ABNORMAL HIGH (ref 0.0–5.0)
Lymphocytes Relative: 34.4 % (ref 12.0–46.0)
Monocytes Relative: 10.1 % (ref 3.0–12.0)
Neutrophils Relative %: 48.1 % (ref 43.0–77.0)
Platelets: 159 10*3/uL (ref 150.0–400.0)
RDW: 15.7 % — ABNORMAL HIGH (ref 11.5–14.6)
WBC: 5.4 10*3/uL (ref 4.5–10.5)

## 2012-02-01 LAB — MAGNESIUM: Magnesium: 2.2 mg/dL (ref 1.5–2.5)

## 2012-02-01 LAB — CK: Total CK: 117 U/L (ref 7–177)

## 2012-02-01 LAB — LIPID PANEL
HDL: 64.9 mg/dL (ref >39.00)
Total CHOL/HDL Ratio: 2
Triglycerides: 55 mg/dL (ref 0.0–149.0)

## 2012-02-01 LAB — COMPREHENSIVE METABOLIC PANEL
ALT: 12 U/L (ref 0–35)
CO2: 29 mEq/L (ref 19–32)
Calcium: 9.6 mg/dL (ref 8.4–10.5)
Chloride: 104 mEq/L (ref 96–112)
GFR: 64.83 mL/min (ref >60.00)
Glucose, Bld: 86 mg/dL (ref 70–99)
Sodium: 141 mEq/L (ref 135–145)
Total Protein: 6.7 g/dL (ref 6.0–8.3)

## 2012-02-01 LAB — TSH: TSH: 5.91 u[IU]/mL — ABNORMAL HIGH (ref 0.35–5.50)

## 2012-02-01 LAB — FECAL OCCULT BLOOD, GUAIAC: Fecal Occult Blood: NEGATIVE

## 2012-02-01 MED ORDER — LEVOTHYROXINE SODIUM 50 MCG PO TABS: 50.0000 ug | ORAL_TABLET | Freq: Every day | ORAL | Status: DC

## 2012-02-01 MED ORDER — HYDROCODONE-ACETAMINOPHEN 5-500 MG PO TABS: 1.0000 | ORAL_TABLET | Freq: Three times a day (TID) | ORAL | Status: DC | PRN

## 2012-02-01 NOTE — Assessment & Plan Note (Signed)
TSH is still elevated so I have asked her to start Synthroid

## 2012-02-01 NOTE — Assessment & Plan Note (Signed)
Continue current meds for pain 

## 2012-02-01 NOTE — Assessment & Plan Note (Signed)
CBC today.  

## 2012-02-01 NOTE — Assessment & Plan Note (Signed)
By her report this has resolved

## 2012-02-01 NOTE — Assessment & Plan Note (Signed)
Her BP is well controlled, I will check her lytes and renal function today 

## 2012-02-01 NOTE — Assessment & Plan Note (Addendum)

## 2012-02-01 NOTE — Assessment & Plan Note (Signed)
She has muscle aches so I will check her CMP and CPK level today and she will stop taking lipitor

## 2012-02-01 NOTE — Progress Notes (Signed)
Subjective:    Patient ID: Claudia [name], female    DOB: 07/25/1933, 78 y.o.   MRN: [id number]  Hyperlipidemia This is a chronic problem. The current episode started more than 1 year ago. The problem is controlled. Recent lipid tests were reviewed and are variable. Exacerbating diseases include hypothyroidism and obesity. She has no history of chronic renal disease, diabetes, liver disease or nephrotic syndrome. Factors aggravating her hyperlipidemia include thiazides. Associated symptoms include myalgias. Pertinent negatives include no chest pain, focal sensory loss, focal weakness, leg pain or shortness of breath. Current antihyperlipidemic treatment includes statins. The current treatment provides significant improvement of lipids. Compliance problems include adherence to exercise and adherence to diet.       Review of Systems  Constitutional: Negative for fever, chills, diaphoresis, activity change, appetite change, fatigue and unexpected weight change.  HENT: Negative.   Eyes: Negative.   Respiratory: Negative for cough, chest tightness, shortness of breath, wheezing and stridor.   Cardiovascular: Negative for chest pain, palpitations and leg swelling.  Gastrointestinal: Negative for nausea, vomiting, abdominal pain, diarrhea, constipation and blood in stool.  Genitourinary: Negative.   Musculoskeletal: Positive for myalgias, back pain (chronic, unchanged) and arthralgias (both knees). Negative for joint swelling and gait problem.  Skin: Negative for color change, pallor, rash and wound.  Neurological: Negative for dizziness, tremors, focal weakness, seizures, syncope, facial asymmetry, speech difficulty, weakness, light-headedness, numbness and headaches.  Hematological: Negative for adenopathy. Does not bruise/bleed easily.  Psychiatric/Behavioral: Negative.        Objective:   Physical Exam  Vitals reviewed. Constitutional: She is oriented to person, place, and time. She  appears well-developed and well-nourished. No distress.  HENT:  Head: Normocephalic and atraumatic.  Mouth/Throat: Oropharynx is clear and moist. No oropharyngeal exudate.  Eyes: Conjunctivae are normal. Right eye exhibits no discharge. Left eye exhibits no discharge. No scleral icterus.  Neck: Normal range of motion. Neck supple. No JVD present. No tracheal deviation present. No thyromegaly present.  Cardiovascular: Normal rate, regular rhythm, normal heart sounds and intact distal pulses.  Exam reveals no gallop and no friction rub.   No murmur heard. Pulmonary/Chest: Effort normal and breath sounds normal. No stridor. No respiratory distress. She has no wheezes. She has no rales. Chest wall is not dull to percussion. She exhibits no mass, no tenderness, no bony tenderness, no laceration, no crepitus, no edema, no deformity, no swelling and no retraction. Right breast exhibits no inverted nipple, no mass, no nipple discharge, no skin change and no tenderness. Left breast exhibits no inverted nipple, no mass, no nipple discharge, no skin change and no tenderness. Breasts are symmetrical.  Abdominal: Soft. Normal appearance and bowel sounds are normal. She exhibits no distension and no mass. There is no hepatosplenomegaly, splenomegaly or hepatomegaly. There is no tenderness. There is no rebound, no guarding and no CVA tenderness.  Genitourinary: Rectum normal. Rectal exam shows no external hemorrhoid, no internal hemorrhoid, no fissure, no mass, no tenderness and anal tone normal. Guaiac negative stool. No breast swelling, tenderness, discharge or bleeding. Pelvic exam was performed with patient supine.  Musculoskeletal: Normal range of motion. She exhibits no edema and no tenderness.  Lymphadenopathy:    She has no cervical adenopathy.  Neurological: She is alert and oriented to person, place, and time. She has normal reflexes. She displays normal reflexes. No cranial nerve deficit. She exhibits  normal muscle tone. Coordination normal.  Skin: Skin is warm and dry. No rash noted. She is not diaphoretic.  No erythema. No pallor.  Psychiatric: She has a normal mood and affect. Her behavior is normal. Judgment and thought content normal.      Lab Results  Component Value Date   WBC 4.8 08/10/2011   HGB 12.0 08/10/2011   HCT 36 08/10/2011   PLT 184 08/10/2011   GLUCOSE 87 02/08/2011   CHOL 150 08/10/2011   TRIG 52 08/10/2011   HDL 69 08/10/2011   LDLCALC 71 08/10/2011   ALT 14 08/10/2011   AST 17 08/10/2011   NA 140 08/10/2011   K 4.2 08/10/2011   CL 107 02/08/2011   CREATININE 0.9 08/10/2011   BUN 22* 08/10/2011   CO2 26 02/08/2011   TSH 6.55* 08/10/2011   HGBA1C 5.6 08/10/2011      Assessment & Plan:

## 2012-02-01 NOTE — Patient Instructions (Signed)
Hypothyroidism The thyroid is a large gland located in the lower front of your neck. The thyroid gland helps control metabolism. Metabolism is how your body handles food. It controls metabolism with the hormone thyroxine. When this gland is underactive (hypothyroid), it produces too little hormone.  CAUSES These include:   Absence or destruction of thyroid tissue.   Goiter due to iodine deficiency.   Goiter due to medications.   Congenital defects (since birth).   Problems with the pituitary. This causes a lack of TSH (thyroid stimulating hormone). This hormone tells the thyroid to turn out more hormone.  SYMPTOMS  Lethargy (feeling as though you have no energy)   Cold intolerance   Weight gain (in spite of normal food intake)   Dry skin   Coarse hair   Menstrual irregularity (if severe, may lead to infertility)   Slowing of thought processes  Cardiac problems are also caused by insufficient amounts of thyroid hormone. Hypothyroidism in the newborn is cretinism, and is an extreme form. It is important that this form be treated adequately and immediately or it will lead rapidly to retarded physical and mental development. DIAGNOSIS  To prove hypothyroidism, your caregiver may do blood tests and ultrasound tests. Sometimes the signs are hidden. It may be necessary for your caregiver to watch this illness with blood tests either before or after diagnosis and treatment. TREATMENT  Low levels of thyroid hormone are increased by using synthetic thyroid hormone. This is a safe, effective treatment. It usually takes about four weeks to gain the full effects of the medication. After you have the full effect of the medication, it will generally take another four weeks for problems to leave. Your caregiver may start you on low doses. If you have had heart problems the dose may be gradually increased. It is generally not an emergency to get rapidly to normal. HOME CARE INSTRUCTIONS   Take  your medications as your caregiver suggests. Let your caregiver know of any medications you are taking or start taking. Your caregiver will help you with dosage schedules.   As your condition improves, your dosage needs may increase. It will be necessary to have continuing blood tests as suggested by your caregiver.   Report all suspected medication side effects to your caregiver.  SEEK MEDICAL CARE IF: Seek medical care if you develop:  Sweating.   Tremulousness (tremors).   Anxiety.   Rapid weight loss.   Heat intolerance.   Emotional swings.   Diarrhea.   Weakness.  SEEK IMMEDIATE MEDICAL CARE IF:  You develop chest pain, an irregular heart beat (palpitations), or a rapid heart beat. MAKE SURE YOU:   Understand these instructions.   Will watch your condition.   Will get help right away if you are not doing well or get worse.  Document Released: 06/14/2005 Document Revised: 06/03/2011 Document Reviewed: 02/02/2008 [facility] Patient Information 2012 [place], [place].Preventive Care for Adults, Female A healthy lifestyle and preventive care can promote health and wellness. Preventive health guidelines for women include the following key practices.  A routine yearly physical is a good way to check with your caregiver about your health and preventive screening. It is a chance to share any concerns and updates on your health, and to receive a thorough exam.   Visit your dentist for a routine exam and preventive care every 6 months. Brush your teeth twice a day and floss once a day. Good oral hygiene prevents tooth decay and gum disease.   The frequency  of eye exams is based on your age, health, family medical history, use of contact lenses, and other factors. Follow your caregiver's recommendations for frequency of eye exams.   Eat a healthy diet. Foods like vegetables, fruits, whole grains, low-fat dairy products, and lean protein foods contain the nutrients you need without too  many calories. Decrease your intake of foods high in solid fats, added sugars, and salt. Eat the right amount of calories for you.Get information about a proper diet from your caregiver, if necessary.   Regular physical exercise is one of the most important things you can do for your health. Most adults should get at least 150 minutes of moderate-intensity exercise (any activity that increases your heart rate and causes you to sweat) each week. In addition, most adults need muscle-strengthening exercises on 2 or more days a week.   Maintain a healthy weight. The body mass index (BMI) is a screening tool to identify possible weight problems. It provides an estimate of body fat based on height and weight. Your caregiver can help determine your BMI, and can help you achieve or maintain a healthy weight.For adults 20 years and older:   A BMI below 18.5 is considered underweight.   A BMI of 18.5 to 24.9 is normal.   A BMI of 25 to 29.9 is considered overweight.   A BMI of 30 and above is considered obese.   Maintain normal blood lipids and cholesterol levels by exercising and minimizing your intake of saturated fat. Eat a balanced diet with plenty of fruit and vegetables. Blood tests for lipids and cholesterol should begin at age 53 and be repeated every 5 years. If your lipid or cholesterol levels are high, you are over 50, or you are at high risk for heart disease, you may need your cholesterol levels checked more frequently.Ongoing high lipid and cholesterol levels should be treated with medicines if diet and exercise are not effective.   If you smoke, find out from your caregiver how to quit. If you do not use tobacco, do not start.   If you are pregnant, do not drink alcohol. If you are breastfeeding, be very cautious about drinking alcohol. If you are not pregnant and choose to drink alcohol, do not exceed 1 drink per day. One drink is considered to be 12 ounces (355 mL) of beer, 5 ounces (148  mL) of wine, or 1.5 ounces (44 mL) of liquor.   Avoid use of street drugs. Do not share needles with anyone. Ask for help if you need support or instructions about stopping the use of drugs.   High blood pressure causes heart disease and increases the risk of stroke. Your blood pressure should be checked at least every 1 to 2 years. Ongoing high blood pressure should be treated with medicines if weight loss and exercise are not effective.   If you are 14 to 76 years old, ask your caregiver if you should take aspirin to prevent strokes.   Diabetes screening involves taking a blood sample to check your fasting blood sugar level. This should be done once every 3 years, after age 73, if you are within normal weight and without risk factors for diabetes. Testing should be considered at a younger age or be carried out more frequently if you are overweight and have at least 1 risk factor for diabetes.   Breast cancer screening is essential preventive care for women. You should practice "breast self-awareness." This means understanding the normal appearance and feel  of your breasts and may include breast self-examination. Any changes detected, no matter how small, should be reported to a caregiver. Women in their 21s and 30s should have a clinical breast exam (CBE) by a caregiver as part of a regular health exam every 1 to 3 years. After age 38, women should have a CBE every year. Starting at age 7, women should consider having a mammography (breast X-ray test) every year. Women who have a family history of breast cancer should talk to their caregiver about genetic screening. Women at a high risk of breast cancer should talk to their caregivers about having magnetic resonance imaging (MRI) and a mammography every year.   The Pap test is a screening test for cervical cancer. A Pap test can show cell changes on the cervix that might become cervical cancer if left untreated. A Pap test is a procedure in which  cells are obtained and examined from the lower end of the uterus (cervix).   Women should have a Pap test starting at age 29.   Between ages 28 and 86, Pap tests should be repeated every 2 years.   Beginning at age 45, you should have a Pap test every 3 years as long as the past 3 Pap tests have been normal.   Some women have medical problems that increase the chance of getting cervical cancer. Talk to your caregiver about these problems. It is especially important to talk to your caregiver if a new problem develops soon after your last Pap test. In these cases, your caregiver may recommend more frequent screening and Pap tests.   The above recommendations are the same for women who have or have not gotten the vaccine for human papillomavirus (HPV).   If you had a hysterectomy for a problem that was not cancer or a condition that could lead to cancer, then you no longer need Pap tests. Even if you no longer need a Pap test, a regular exam is a good idea to make sure no other problems are starting.   If you are between ages 61 and 87, and you have had normal Pap tests going back 10 years, you no longer need Pap tests. Even if you no longer need a Pap test, a regular exam is a good idea to make sure no other problems are starting.   If you have had past treatment for cervical cancer or a condition that could lead to cancer, you need Pap tests and screening for cancer for at least 20 years after your treatment.   If Pap tests have been discontinued, risk factors (such as a new sexual partner) need to be reassessed to determine if screening should be resumed.   The HPV test is an additional test that may be used for cervical cancer screening. The HPV test looks for the virus that can cause the cell changes on the cervix. The cells collected during the Pap test can be tested for HPV. The HPV test could be used to screen women aged 41 years and older, and should be used in women of any age who have  unclear Pap test results. After the age of 39, women should have HPV testing at the same frequency as a Pap test.   Colorectal cancer can be detected and often prevented. Most routine colorectal cancer screening begins at the age of 16 and continues through age 76. However, your caregiver may recommend screening at an earlier age if you have risk factors for colon cancer.  On a yearly basis, your caregiver may provide home test kits to check for hidden blood in the stool. Use of a small camera at the end of a tube, to directly examine the colon (sigmoidoscopy or colonoscopy), can detect the earliest forms of colorectal cancer. Talk to your caregiver about this at age 74, when routine screening begins. Direct examination of the colon should be repeated every 5 to 10 years through age 70, unless early forms of pre-cancerous polyps or small growths are found.   Hepatitis C blood testing is recommended for all people born from [birth year] through 1965 and any individual with known risks for hepatitis C.   Practice safe sex. Use condoms and avoid high-risk sexual practices to reduce the spread of sexually transmitted infections (STIs). STIs include gonorrhea, chlamydia, syphilis, trichomonas, herpes, HPV, and human immunodeficiency virus (HIV). Herpes, HIV, and HPV are viral illnesses that have no cure. They can result in disability, cancer, and death. Sexually active women aged 68 and younger should be checked for chlamydia. Older women with new or multiple partners should also be tested for chlamydia. Testing for other STIs is recommended if you are sexually active and at increased risk.   Osteoporosis is a disease in which the bones lose minerals and strength with aging. This can result in serious bone fractures. The risk of osteoporosis can be identified using a bone density scan. Women ages 33 and over and women at risk for fractures or osteoporosis should discuss screening with their caregivers. Ask your caregiver  whether you should take a calcium supplement or vitamin D to reduce the rate of osteoporosis.   Menopause can be associated with physical symptoms and risks. Hormone replacement therapy is available to decrease symptoms and risks. You should talk to your caregiver about whether hormone replacement therapy is right for you.   Use sunscreen with sun protection factor (SPF) of 30 or more. Apply sunscreen liberally and repeatedly throughout the day. You should seek shade when your shadow is shorter than you. Protect yourself by wearing long sleeves, pants, a wide-brimmed hat, and sunglasses year round, whenever you are outdoors.   Once a month, do a whole body skin exam, using a mirror to look at the skin on your back. Notify your caregiver of new moles, moles that have irregular borders, moles that are larger than a pencil eraser, or moles that have changed in shape or color.   Stay current with required immunizations.   Influenza. You need a dose every fall (or winter). The composition of the flu vaccine changes each year, so being vaccinated once is not enough.   Pneumococcal polysaccharide. You need 1 to 2 doses if you smoke cigarettes or if you have certain chronic medical conditions. You need 1 dose at age 98 (or older) if you have never been vaccinated.   Tetanus, diphtheria, pertussis (Tdap, Td). Get 1 dose of Tdap vaccine if you are younger than age 13, are over 4 and have contact with an infant, are a Research scientist (physical sciences), are pregnant, or simply want to be protected from whooping cough. After that, you need a Td booster dose every 10 years. Consult your caregiver if you have not had at least 3 tetanus and diphtheria-containing shots sometime in your life or have a deep or dirty wound.   HPV. You need this vaccine if you are a woman age 76 or younger. The vaccine is given in 3 doses over 6 months.   Measles, mumps, rubella (MMR). You need  at least 1 dose of MMR if you were born in 1957 or  later. You may also need a second dose.   Meningococcal. If you are age 53 to 49 and a first-year college student living in a residence hall, or have one of several medical conditions, you need to get vaccinated against meningococcal disease. You may also need additional booster doses.   Zoster (shingles). If you are age 33 or older, you should get this vaccine.   Varicella (chickenpox). If you have never had chickenpox or you were vaccinated but received only 1 dose, talk to your caregiver to find out if you need this vaccine.   Hepatitis A. You need this vaccine if you have a specific risk factor for hepatitis A virus infection or you simply wish to be protected from this disease. The vaccine is usually given as 2 doses, 6 to 18 months apart.   Hepatitis B. You need this vaccine if you have a specific risk factor for hepatitis B virus infection or you simply wish to be protected from this disease. The vaccine is given in 3 doses, usually over 6 months.  Preventive Services / Frequency Ages 58 to 60  Blood pressure check.** / Every 1 to 2 years.   Lipid and cholesterol check.** / Every 5 years beginning at age 34.   Clinical breast exam.** / Every 3 years for women in their 29s and 30s.   Pap test.** / Every 2 years from ages 18 through 21. Every 3 years starting at age 69 through age 50 or 71 with a history of 3 consecutive normal Pap tests.   HPV screening.** / Every 3 years from ages 54 through ages 60 to 64 with a history of 3 consecutive normal Pap tests.   Hepatitis C blood test.** / For any individual with known risks for hepatitis C.   Skin self-exam. / Monthly.   Influenza immunization.** / Every year.   Pneumococcal polysaccharide immunization.** / 1 to 2 doses if you smoke cigarettes or if you have certain chronic medical conditions.   Tetanus, diphtheria, pertussis (Tdap, Td) immunization. / A one-time dose of Tdap vaccine. After that, you need a Td booster dose every 10  years.   HPV immunization. / 3 doses over 6 months, if you are 62 and younger.   Measles, mumps, rubella (MMR) immunization. / You need at least 1 dose of MMR if you were born in 1957 or later. You may also need a second dose.   Meningococcal immunization. / 1 dose if you are age 32 to 25 and a first-year college student living in a residence hall, or have one of several medical conditions, you need to get vaccinated against meningococcal disease. You may also need additional booster doses.   Varicella immunization.** / Consult your caregiver.   Hepatitis A immunization.** / Consult your caregiver. 2 doses, 6 to 18 months apart.   Hepatitis B immunization.** / Consult your caregiver. 3 doses usually over 6 months.  Ages 41 to 44  Blood pressure check.** / Every 1 to 2 years.   Lipid and cholesterol check.** / Every 5 years beginning at age 30.   Clinical breast exam.** / Every year after age 21.   Mammogram.** / Every year beginning at age 86 and continuing for as long as you are in good health. Consult with your caregiver.   Pap test.** / Every 3 years starting at age 4 through age 53 or 63 with a history of 3  consecutive normal Pap tests.   HPV screening.** / Every 3 years from ages 91 through ages 19 to 30 with a history of 3 consecutive normal Pap tests.   Fecal occult blood test (FOBT) of stool. / Every year beginning at age 23 and continuing until age 13. You may not need to do this test if you get a colonoscopy every 10 years.   Flexible sigmoidoscopy or colonoscopy.** / Every 5 years for a flexible sigmoidoscopy or every 10 years for a colonoscopy beginning at age 42 and continuing until age 57.   Hepatitis C blood test.** / For all people born from [birth year] through 1963 and any individual with known risks for hepatitis C.   Skin self-exam. / Monthly.   Influenza immunization.** / Every year.   Pneumococcal polysaccharide immunization.** / 1 to 2 doses if you smoke  cigarettes or if you have certain chronic medical conditions.   Tetanus, diphtheria, pertussis (Tdap, Td) immunization.** / A one-time dose of Tdap vaccine. After that, you need a Td booster dose every 10 years.   Measles, mumps, rubella (MMR) immunization. / You need at least 1 dose of MMR if you were born in 1957 or later. You may also need a second dose.   Varicella immunization.** / Consult your caregiver.   Meningococcal immunization.** / Consult your caregiver.   Hepatitis A immunization.** / Consult your caregiver. 2 doses, 6 to 18 months apart.   Hepatitis B immunization.** / Consult your caregiver. 3 doses, usually over 6 months.  Ages 61 and over  Blood pressure check.** / Every 1 to 2 years.   Lipid and cholesterol check.** / Every 5 years beginning at age 4.   Clinical breast exam.** / Every year after age 43.   Mammogram.** / Every year beginning at age 28 and continuing for as long as you are in good health. Consult with your caregiver.   Pap test.** / Every 3 years starting at age 40 through age 18 or 51 with a 3 consecutive normal Pap tests. Testing can be stopped between 65 and 70 with 3 consecutive normal Pap tests and no abnormal Pap or HPV tests in the past 10 years.   HPV screening.** / Every 3 years from ages 56 through ages 19 or 14 with a history of 3 consecutive normal Pap tests. Testing can be stopped between 65 and 70 with 3 consecutive normal Pap tests and no abnormal Pap or HPV tests in the past 10 years.   Fecal occult blood test (FOBT) of stool. / Every year beginning at age 69 and continuing until age 89. You may not need to do this test if you get a colonoscopy every 10 years.   Flexible sigmoidoscopy or colonoscopy.** / Every 5 years for a flexible sigmoidoscopy or every 10 years for a colonoscopy beginning at age 68 and continuing until age 16.   Hepatitis C blood test.** / For all people born from [birth year] through 1963 and any individual with known  risks for hepatitis C.   Osteoporosis screening.** / A one-time screening for women ages 40 and over and women at risk for fractures or osteoporosis.   Skin self-exam. / Monthly.   Influenza immunization.** / Every year.   Pneumococcal polysaccharide immunization.** / 1 dose at age 61 (or older) if you have never been vaccinated.   Tetanus, diphtheria, pertussis (Tdap, Td) immunization. / A one-time dose of Tdap vaccine if you are over 65 and have contact with an infant, are a  healthcare worker, or simply want to be protected from whooping cough. After that, you need a Td booster dose every 10 years.   Varicella immunization.** / Consult your caregiver.   Meningococcal immunization.** / Consult your caregiver.   Hepatitis A immunization.** / Consult your caregiver. 2 doses, 6 to 18 months apart.   Hepatitis B immunization.** / Check with your caregiver. 3 doses, usually over 6 months.  ** Family history and personal history of risk and conditions may change your caregiver's recommendations. Document Released: 08/10/2001 Document Revised: 06/03/2011 Document Reviewed: 11/09/2010 [facility] Patient Information 2012 [place], [place].

## 2012-04-06 ENCOUNTER — Ambulatory Visit (INDEPENDENT_AMBULATORY_CARE_PROVIDER_SITE_OTHER): Payer: Medicare Other | Admitting: *Deleted

## 2012-04-06 DIAGNOSIS — Z23 Encounter for immunization: Secondary | ICD-10-CM

## 2012-05-04 ENCOUNTER — Ambulatory Visit (INDEPENDENT_AMBULATORY_CARE_PROVIDER_SITE_OTHER): Payer: Medicare Other | Admitting: Internal Medicine

## 2012-05-04 ENCOUNTER — Other Ambulatory Visit (INDEPENDENT_AMBULATORY_CARE_PROVIDER_SITE_OTHER): Payer: Medicare Other

## 2012-05-04 ENCOUNTER — Encounter: Payer: Self-pay | Admitting: Internal Medicine

## 2012-05-04 VITALS — BP 138/70 | HR 60 | Temp 98.7°F | Resp 16

## 2012-05-04 DIAGNOSIS — I10 Essential (primary) hypertension: Secondary | ICD-10-CM

## 2012-05-04 DIAGNOSIS — G629 Polyneuropathy, unspecified: Secondary | ICD-10-CM

## 2012-05-04 DIAGNOSIS — G609 Hereditary and idiopathic neuropathy, unspecified: Secondary | ICD-10-CM

## 2012-05-04 DIAGNOSIS — E039 Hypothyroidism, unspecified: Secondary | ICD-10-CM

## 2012-05-04 LAB — TSH: TSH: 1.6 u[IU]/mL (ref 0.35–5.50)

## 2012-05-04 NOTE — Progress Notes (Signed)
Subjective:    Patient ID: Claudia [name], female    DOB: Oct 12, 1933, 78 y.o.   MRN: [id number]  Thyroid Problem Presents for follow-up visit. Patient reports no anxiety, cold intolerance, constipation, depressed mood, diaphoresis, diarrhea, dry skin, fatigue, hair loss, heat intolerance, hoarse voice, leg swelling, menstrual problem, palpitations, tremors, visual change, weight gain or weight loss. The symptoms have been stable.      Review of Systems  Constitutional: Negative for fever, chills, weight loss, weight gain, diaphoresis, activity change, appetite change, fatigue and unexpected weight change.  HENT: Negative.  Negative for hoarse voice.   Eyes: Negative.   Respiratory: Negative.  Negative for cough, shortness of breath, wheezing and stridor.   Cardiovascular: Negative.  Negative for chest pain, palpitations and leg swelling.  Gastrointestinal: Negative.  Negative for diarrhea and constipation.  Genitourinary: Negative for menstrual problem.  Musculoskeletal: Positive for back pain. Negative for myalgias, joint swelling, arthralgias and gait problem.  Skin: Negative for color change, pallor, rash and wound.  Neurological: Positive for numbness (and tingling in her feet, worsening). Negative for dizziness, tremors, seizures, syncope, facial asymmetry, speech difficulty, weakness, light-headedness and headaches.  Hematological: Negative for cold intolerance, heat intolerance and adenopathy. Does not bruise/bleed easily.  Psychiatric/Behavioral: Negative.        Objective:   Physical Exam  Vitals reviewed. Constitutional: She is oriented to person, place, and time. She appears well-developed and well-nourished. No distress.  HENT:  [name]: Normocephalic and atraumatic.  Mouth/Throat: Oropharynx is clear and moist. No oropharyngeal exudate.  Eyes: Conjunctivae normal are normal. Right eye exhibits no discharge. Left eye exhibits no discharge. No scleral icterus.  Neck:  Normal range of motion. Neck supple. No JVD present. No tracheal deviation present. No thyromegaly present.  Cardiovascular: Normal rate, regular rhythm, normal heart sounds and intact distal pulses.  Exam reveals no gallop and no friction rub.   No murmur heard. Pulmonary/Chest: Effort normal and breath sounds normal. No stridor. No respiratory distress. She has no wheezes. She has no rales. She exhibits no tenderness.  Abdominal: Soft. Bowel sounds are normal. She exhibits no distension and no mass. There is no tenderness. There is no rebound and no guarding.  Musculoskeletal: Normal range of motion. She exhibits no edema and no tenderness.  Lymphadenopathy:    She has no cervical adenopathy.  Neurological: She is alert and oriented to person, place, and time. She has normal strength. She displays no atrophy, no tremor and normal reflexes. No cranial nerve deficit or sensory deficit. She exhibits normal muscle tone. She displays a negative Romberg sign. She displays no seizure activity. Coordination and gait normal. She displays no Babinski's sign on the right side. She displays no Babinski's sign on the left side.  Reflex Scores:      Tricep reflexes are 1+ on the right side and 1+ on the left side.      Bicep reflexes are 1+ on the right side and 1+ on the left side.      Brachioradialis reflexes are 1+ on the right side and 1+ on the left side.      Patellar reflexes are 0 on the right side and 0 on the left side.      Achilles reflexes are 0 on the right side and 0 on the left side. Skin: Skin is warm and dry. No rash noted. She is not diaphoretic. No erythema. No pallor.  Psychiatric: She has a normal mood and affect. Her behavior is normal. Judgment and  thought content normal.     Lab Results  Component Value Date   WBC 5.4 02/01/2012   HGB 12.0 02/01/2012   HCT 36.9 02/01/2012   PLT 159.0 02/01/2012   GLUCOSE 86 02/01/2012   CHOL 142 02/01/2012   TRIG 55.0 02/01/2012   HDL 64.90 02/01/2012    LDLCALC 66 02/01/2012   ALT 12 02/01/2012   AST 19 02/01/2012   NA 141 02/01/2012   K 4.9 02/01/2012   CL 104 02/01/2012   CREATININE 1.1 02/01/2012   BUN 24* 02/01/2012   CO2 29 02/01/2012   TSH 5.91* 02/01/2012   HGBA1C 5.6 08/10/2011       Assessment & Plan:

## 2012-05-04 NOTE — Patient Instructions (Signed)

## 2012-05-04 NOTE — Assessment & Plan Note (Signed)
Her labs have been normal, she does have a history of back surgery so I will check her NCS/EMG to see of she has nerve damage

## 2012-05-04 NOTE — Assessment & Plan Note (Signed)
Her BP is well controlled 

## 2012-05-04 NOTE — Assessment & Plan Note (Signed)
I will recheck her TSH today and will address if needed 

## 2012-05-19 ENCOUNTER — Other Ambulatory Visit: Payer: Self-pay | Admitting: Internal Medicine

## 2012-05-19 DIAGNOSIS — G629 Polyneuropathy, unspecified: Secondary | ICD-10-CM

## 2012-05-23 ENCOUNTER — Telehealth: Payer: Self-pay

## 2012-05-23 DIAGNOSIS — G629 Polyneuropathy, unspecified: Secondary | ICD-10-CM

## 2012-05-23 NOTE — Telephone Encounter (Signed)
Pt called stating that she was told that MD would call in Gabapentin 300 mg for peripheral neuropathy but no Rx was sent to pharmacy. Please advise, pt says she spoke with MD 11/25?

## 2012-05-24 MED ORDER — GABAPENTIN 300 MG PO CAPS: 300.0000 mg | ORAL_CAPSULE | Freq: Three times a day (TID) | ORAL | Status: DC

## 2012-05-24 NOTE — Telephone Encounter (Signed)
done

## 2012-07-12 ENCOUNTER — Telehealth: Payer: Self-pay

## 2012-07-12 DIAGNOSIS — M545 Low back pain: Secondary | ICD-10-CM

## 2012-07-12 MED ORDER — HYDROCODONE-ACETAMINOPHEN 5-325 MG PO TABS: 1.0000 | ORAL_TABLET | Freq: Four times a day (QID) | ORAL | Status: DC | PRN

## 2012-07-12 NOTE — Telephone Encounter (Signed)
changed

## 2012-07-12 NOTE — Telephone Encounter (Signed)
Received form from pharmacy stating insurance will not cover hydrocodone 5-500 with out a PA due to new limits of acetaminophen need to be lower. Please advise if want to proceed with PA or just change to a lower dose Thanks

## 2012-07-19 ENCOUNTER — Telehealth: Payer: Self-pay | Admitting: Internal Medicine

## 2012-07-19 NOTE — Telephone Encounter (Signed)
Patient called to say that [employer] had been trying to reach Dr. Yetta Barre for a statement regarding refill of Hydrocodone.  Following call was message from [facility] stating that the Hydrocodone rx was being denied because that had not received a physician's statement from Dr. Yetta Barre.  Further advised that in order to re-open and fill rx, please call [phone].

## 2012-07-20 NOTE — Telephone Encounter (Signed)
Medication was changed to what insurance requires per last phone note. 500mg  of acetaminophen is no longer covered// [facility] advising

## 2012-08-29 ENCOUNTER — Other Ambulatory Visit: Payer: Self-pay | Admitting: Internal Medicine

## 2012-08-29 ENCOUNTER — Telehealth: Payer: Self-pay | Admitting: Internal Medicine

## 2012-08-29 NOTE — Telephone Encounter (Signed)
Caller: Unnamed/Patient; Phone: [phone]; Reason for Call: Patient calling, she cannot get the new script for pain medication that was sent to the pharmacy 2 months ago.  Uses [facility] on [address]. Needs a 30 day supply, #90, taking 3 daily as ordered.

## 2012-08-29 NOTE — Telephone Encounter (Signed)
Called pharmacy and updated #90

## 2012-08-29 NOTE — Telephone Encounter (Signed)
Take 1 tablet by mouth every 6 (six) hours as needed for pain approved by MD 07/12/12 #65 only with 3rf. Called into pharmacy and advise pt will need to follow up with MD if a higher quantity is needed.  Please advise if this is correct or ok to dispense #90 per pt request

## 2012-08-29 NOTE — Telephone Encounter (Signed)
yes

## 2012-09-05 ENCOUNTER — Ambulatory Visit (INDEPENDENT_AMBULATORY_CARE_PROVIDER_SITE_OTHER): Payer: Medicare Other | Admitting: Internal Medicine

## 2012-09-05 ENCOUNTER — Other Ambulatory Visit (INDEPENDENT_AMBULATORY_CARE_PROVIDER_SITE_OTHER): Payer: Medicare Other

## 2012-09-05 ENCOUNTER — Encounter: Payer: Self-pay | Admitting: Internal Medicine

## 2012-09-05 VITALS — BP 130/68 | HR 60 | Temp 97.4°F | Resp 16 | Wt 234.0 lb

## 2012-09-05 DIAGNOSIS — E7800 Pure hypercholesterolemia, unspecified: Secondary | ICD-10-CM

## 2012-09-05 DIAGNOSIS — M179 Osteoarthritis of knee, unspecified: Secondary | ICD-10-CM

## 2012-09-05 DIAGNOSIS — E039 Hypothyroidism, unspecified: Secondary | ICD-10-CM

## 2012-09-05 DIAGNOSIS — I10 Essential (primary) hypertension: Secondary | ICD-10-CM

## 2012-09-05 DIAGNOSIS — M545 Low back pain, unspecified: Secondary | ICD-10-CM

## 2012-09-05 LAB — LIPID PANEL
HDL: 64.6 mg/dL (ref >39.00)
Triglycerides: 74 mg/dL (ref 0.0–149.0)
VLDL: 14.8 mg/dL (ref 0.0–40.0)

## 2012-09-05 LAB — COMPREHENSIVE METABOLIC PANEL
ALT: 16 U/L (ref 0–35)
Alkaline Phosphatase: 63 U/L (ref 39–117)
CO2: 25 mEq/L (ref 19–32)
Creatinine, Ser: 1 mg/dL (ref 0.4–1.2)
GFR: 70.86 mL/min (ref >60.00)
Total Bilirubin: 1.2 mg/dL (ref 0.3–1.2)

## 2012-09-05 LAB — LDL CHOLESTEROL, DIRECT: Direct LDL: 158.9 mg/dL

## 2012-09-05 MED ORDER — LEVOTHYROXINE SODIUM 50 MCG PO TABS: 50.0000 ug | ORAL_TABLET | Freq: Every day | ORAL | Status: DC

## 2012-09-05 MED ORDER — QUINAPRIL HCL 40 MG PO TABS: 40.0000 mg | ORAL_TABLET | Freq: Every day | ORAL | Status: DC

## 2012-09-05 MED ORDER — BUPRENORPHINE 10 MCG/HR TD PTWK: 10.0000 ug | MEDICATED_PATCH | TRANSDERMAL | Status: DC

## 2012-09-05 MED ORDER — COLESEVELAM HCL 3.75 G PO PACK: 1.0000 | PACK | Freq: Every day | ORAL | Status: DC

## 2012-09-05 MED ORDER — HYDROCODONE-ACETAMINOPHEN 5-325 MG PO TABS: 1.0000 | ORAL_TABLET | Freq: Four times a day (QID) | ORAL | Status: DC | PRN

## 2012-09-05 MED ORDER — TORSEMIDE 20 MG PO TABS: 20.0000 mg | ORAL_TABLET | Freq: Every day | ORAL | Status: DC

## 2012-09-05 NOTE — Progress Notes (Signed)
Subjective:    Patient ID: Claudia [name], female    DOB: [birth year], 79 y.o.   MRN: [id number]  Back Pain This is a chronic problem. The current episode started more than 1 year ago. The problem occurs intermittently. The problem has been gradually worsening since onset. The pain is present in the lumbar spine. The quality of the pain is described as aching. The pain does not radiate. The pain is at a severity of 6/10. The pain is moderate. The pain is worse during the day. The symptoms are aggravated by bending, position and standing. Pertinent negatives include no abdominal pain, bladder incontinence, bowel incontinence, chest pain, dysuria, fever, headaches, leg pain, numbness, paresis, paresthesias, pelvic pain, perianal numbness, tingling, weakness or weight loss. She has tried NSAIDs and analgesics (several surgeries, ESI a few times) for the symptoms. The treatment provided mild relief.      Review of Systems  Constitutional: Negative for fever, chills, weight loss, diaphoresis, activity change, appetite change, fatigue and unexpected weight change.  HENT: Negative.   Eyes: Negative.   Respiratory: Negative for cough, chest tightness, shortness of breath, wheezing and stridor.   Cardiovascular: Negative for chest pain, palpitations and leg swelling.  Gastrointestinal: Negative for nausea, vomiting, abdominal pain, diarrhea, constipation and bowel incontinence.  Endocrine: Negative.   Genitourinary: Negative.  Negative for bladder incontinence, dysuria and pelvic pain.  Musculoskeletal: Positive for back pain and arthralgias (both knees). Negative for myalgias, joint swelling and gait problem.  Skin: Negative for color change, pallor, rash and wound.  Allergic/Immunologic: Negative.   Neurological: Negative.  Negative for dizziness, tingling, tremors, weakness, light-headedness, numbness, headaches and paresthesias.  Hematological: Negative for adenopathy. Does not bruise/bleed  easily.  Psychiatric/Behavioral: Negative.        Objective:   Physical Exam  Vitals reviewed. Constitutional: She is oriented to person, place, and time. She appears well-developed and well-nourished. No distress.  HENT:  Head: Normocephalic and atraumatic.  Mouth/Throat: Oropharynx is clear and moist. No oropharyngeal exudate.  Eyes: Conjunctivae are normal. Right eye exhibits no discharge. Left eye exhibits no discharge. No scleral icterus.  Neck: Normal range of motion. Neck supple. No JVD present. No tracheal deviation present. No thyromegaly present.  Cardiovascular: Normal rate, regular rhythm, normal heart sounds and intact distal pulses.  Exam reveals no gallop and no friction rub.   No murmur heard. Pulmonary/Chest: Effort normal and breath sounds normal. No stridor. No respiratory distress. She has no wheezes. She has no rales. She exhibits no tenderness.  Abdominal: Soft. Bowel sounds are normal. She exhibits no distension and no mass. There is no tenderness. There is no rebound and no guarding.  Musculoskeletal: Normal range of motion. She exhibits no edema.       Right knee: She exhibits deformity (severe DJD). She exhibits normal range of motion, no swelling, no effusion, no ecchymosis, no laceration, no erythema, normal alignment, no LCL laxity, normal patellar mobility and no bony tenderness. No tenderness found.       Left knee: She exhibits deformity (severe DJD). She exhibits normal range of motion, no swelling, no effusion, no ecchymosis, no laceration, no erythema, normal alignment, no LCL laxity, normal patellar mobility and no bony tenderness. No tenderness found.       Lumbar back: Normal.  Lymphadenopathy:    She has no cervical adenopathy.  Neurological: She is alert and oriented to person, place, and time. She has normal strength. She displays no atrophy, no tremor and normal reflexes. No cranial  nerve deficit or sensory deficit. She exhibits normal muscle tone.  She displays a negative Romberg sign. She displays no seizure activity. Coordination and gait normal.  Reflex Scores:      Tricep reflexes are 1+ on the right side and 1+ on the left side.      Bicep reflexes are 1+ on the right side and 1+ on the left side.      Brachioradialis reflexes are 1+ on the right side and 1+ on the left side.      Patellar reflexes are 1+ on the right side and 1+ on the left side.      Achilles reflexes are 1+ on the right side and 1+ on the left side. - SLR in BLE  Skin: Skin is warm and dry. No rash noted. She is not diaphoretic. No erythema. No pallor.  Psychiatric: She has a normal mood and affect. Her behavior is normal. Judgment and thought content normal.      Lab Results  Component Value Date   WBC 5.4 02/01/2012   HGB 12.0 02/01/2012   HCT 36.9 02/01/2012   PLT 159.0 02/01/2012   GLUCOSE 86 02/01/2012   CHOL 142 02/01/2012   TRIG 55.0 02/01/2012   HDL 64.90 02/01/2012   LDLCALC 66 02/01/2012   ALT 12 02/01/2012   AST 19 02/01/2012   NA 141 02/01/2012   K 4.9 02/01/2012   CL 104 02/01/2012   CREATININE 1.1 02/01/2012   BUN 24* 02/01/2012   CO2 29 02/01/2012   TSH 1.60 05/04/2012   HGBA1C 5.6 08/10/2011      Assessment & Plan:

## 2012-09-05 NOTE — Assessment & Plan Note (Signed)
Her BP is well controlled Today I will check her lytes and renal function 

## 2012-09-05 NOTE — Assessment & Plan Note (Signed)
Try butrans for better pain control 

## 2012-09-05 NOTE — Assessment & Plan Note (Signed)
Try butrans for better pain control

## 2012-09-05 NOTE — Assessment & Plan Note (Signed)
I will recheck her TSH today 

## 2012-09-05 NOTE — Patient Instructions (Signed)

## 2012-09-05 NOTE — Assessment & Plan Note (Signed)
LDL is too high She does not want to take a statin Start welchol

## 2012-09-06 ENCOUNTER — Telehealth: Payer: Self-pay | Admitting: Internal Medicine

## 2012-09-06 NOTE — Telephone Encounter (Signed)
Patient calls to inquire about new prescriptions.  RN is able to answer using visit from yesterday the reasons for Butrins and Welchol.  However patient states that she had finished her Thyroid medication; on the visit from yesterday Dr. Yetta Barre stated that he did not know if she needed to continue to take.  However, when she picked up meds today there was Levothyroxine .  She declined picking it up until she heard from the office.  PLEASE NOTIFY PATIENT IF SHE IS TO CONTINUE TAKING THE LEVOTHYROXINE. THANKS

## 2012-09-07 NOTE — Telephone Encounter (Signed)
Yes, I would like for her to continue taking synthroid

## 2012-09-07 NOTE — Telephone Encounter (Signed)
Pt informed of MD's advisement. 

## 2012-09-20 ENCOUNTER — Encounter: Payer: Self-pay | Admitting: Neurology

## 2012-09-20 ENCOUNTER — Ambulatory Visit (INDEPENDENT_AMBULATORY_CARE_PROVIDER_SITE_OTHER): Payer: Medicare Other | Admitting: Neurology

## 2012-09-20 VITALS — BP 142/78 | HR 64 | Ht 69.0 in | Wt 234.0 lb

## 2012-09-20 DIAGNOSIS — G63 Polyneuropathy in diseases classified elsewhere: Secondary | ICD-10-CM

## 2012-09-20 DIAGNOSIS — M19079 Primary osteoarthritis, unspecified ankle and foot: Secondary | ICD-10-CM

## 2012-09-20 DIAGNOSIS — R269 Unspecified abnormalities of gait and mobility: Secondary | ICD-10-CM

## 2012-09-20 MED ORDER — OXCARBAZEPINE 150 MG PO TABS: ORAL_TABLET | ORAL | Status: DC

## 2012-09-20 NOTE — Progress Notes (Signed)
   Reason for visit: Peripheral neuropathy  Claudia [name] is an 79 y.o. female  History of present illness:  Claudia [name] is a 79 year old right-handed black female with a history of a peripheral neuropathy. The patient notes tingly sensations and dysesthesias mainly involving the feet bilaterally. The patient indicates that the symptoms are worse at nighttime, less noticeable during the daytime. The patient does report some mild gait instability, but she has not had any falls. The patient does not use a cane or a walker for ambulation. The patient has developed hammertoes on the right foot, and she is getting ulcerations across the MP joints of that foot. The patient has no significant problems in this regard on the left foot. The patient has been placed on Cymbalta, and she believes that this has been helpful to some degree. The patient apparently went off of gabapentin. The patient believes that the gabapentin was not helping her at all. The patient indicates that she is sleeping relatively well. The patient is remaining safe, and she has a shower chair to help her stability while bathing.  ROS:  Out of a complete 14 system review of symptoms, the patient complains only of the following symptoms, and all other reviewed systems are negative.  Numbness Gait disorder Foot problems  Blood pressure 142/78, pulse 64, height 5\' 9"  (1.753 m), weight 234 lb (106.142 kg).  Physical Exam  General: The patient is alert and cooperative at the time of the examination. The patient is moderately obese.  Skin: 1+ edema at the ankles is noted bilaterally.   Neurologic Exam  Cranial nerves: Facial symmetry is present. Speech is normal, no aphasia or dysarthria is noted. Extraocular movements are full. Visual fields are full.  Motor: The patient has good strength in all 4 extremities.  Coordination: The patient has good finger-nose-finger and heel-to-shin bilaterally.  Gait and station: The  patient has a wide-based, limping type gait. Tandem gait is unsteady. Romberg is negative. No drift is seen.  Reflexes: Deep tendon reflexes are symmetric, but are depressed.   Assessment/Plan:  One. Peripheral neuropathy  2. Mild gait disorder  The patient appears to have degenerative changes affecting the right foot. She will be referred to a podiatrist. The patient has ulcerations over the MP joints of the second and third toes of the right foot , and she will get pads to protect these toes. The patient will be placed on Trileptal for her neuropathy discomfort, as the Cymbalta dose have been maximized. The patient will begin 150 mg twice daily for 2 weeks, and then she will go to 300 mg twice daily. The patient will contact me if she has side effects on this medication, or she requires a higher dose. The patient otherwise will followup in 5 months.  Marlan Palau MD 09/20/2012 8:27 AM

## 2012-09-20 NOTE — Patient Instructions (Signed)
  We will sent you to a Podiatrist for evaluation of your right foot. You will go on Trileptal for the neuropathy pain.

## 2012-11-14 ENCOUNTER — Other Ambulatory Visit: Payer: Self-pay | Admitting: Internal Medicine

## 2012-11-14 DIAGNOSIS — E7800 Pure hypercholesterolemia, unspecified: Secondary | ICD-10-CM

## 2012-11-14 MED ORDER — EZETIMIBE 10 MG PO TABS: 10.0000 mg | ORAL_TABLET | Freq: Every day | ORAL | Status: DC

## 2012-11-22 ENCOUNTER — Telehealth: Payer: Self-pay | Admitting: Internal Medicine

## 2012-11-22 DIAGNOSIS — E7800 Pure hypercholesterolemia, unspecified: Secondary | ICD-10-CM

## 2012-11-22 MED ORDER — EZETIMIBE 10 MG PO TABS: 10.0000 mg | ORAL_TABLET | Freq: Every day | ORAL | Status: DC

## 2012-11-22 NOTE — Telephone Encounter (Signed)
Request for Rx sent to mail order per Pt

## 2012-11-22 NOTE — Telephone Encounter (Signed)
Zetia rx went to [facility]. Too Expensive there.  Please send a 90 day with refills to [facility].

## 2012-12-25 ENCOUNTER — Other Ambulatory Visit: Payer: Self-pay | Admitting: Internal Medicine

## 2013-01-05 ENCOUNTER — Ambulatory Visit (INDEPENDENT_AMBULATORY_CARE_PROVIDER_SITE_OTHER): Payer: Self-pay | Admitting: Internal Medicine

## 2013-01-05 ENCOUNTER — Encounter: Payer: Self-pay | Admitting: Internal Medicine

## 2013-01-05 VITALS — BP 130/64 | HR 56 | Temp 97.5°F | Resp 16 | Wt 229.4 lb

## 2013-01-05 DIAGNOSIS — M545 Low back pain: Secondary | ICD-10-CM

## 2013-01-05 DIAGNOSIS — E039 Hypothyroidism, unspecified: Secondary | ICD-10-CM

## 2013-01-05 DIAGNOSIS — E7800 Pure hypercholesterolemia, unspecified: Secondary | ICD-10-CM

## 2013-01-05 DIAGNOSIS — I10 Essential (primary) hypertension: Secondary | ICD-10-CM

## 2013-01-05 MED ORDER — PITAVASTATIN CALCIUM 2 MG PO TABS: 1.0000 | ORAL_TABLET | Freq: Every day | ORAL | Status: DC

## 2013-01-05 NOTE — Assessment & Plan Note (Signed)
Her BP is well controlled 

## 2013-01-05 NOTE — Assessment & Plan Note (Signed)
She wants to see a back pain specialist

## 2013-01-05 NOTE — Assessment & Plan Note (Signed)
She will try livalo

## 2013-01-05 NOTE — Progress Notes (Signed)
  Subjective:    Patient ID: Claudia [name], female    DOB: 07-27-1933, 79 y.o.   MRN: [id number]  Hyperlipidemia This is a chronic problem. The current episode started more than 1 year ago. The problem is resistant. Recent lipid tests were reviewed and are variable. Exacerbating diseases include obesity. She has no history of chronic renal disease, diabetes, hypothyroidism, liver disease or nephrotic syndrome. There are no known factors aggravating her hyperlipidemia. Pertinent negatives include no chest pain, focal sensory loss, focal weakness, leg pain, myalgias or shortness of breath. Current antihyperlipidemic treatment includes ezetimibe. The current treatment provides mild improvement of lipids. Compliance problems include medication cost.       Review of Systems  Constitutional: Negative.   HENT: Negative.   Eyes: Negative.   Respiratory: Negative.  Negative for cough, chest tightness, shortness of breath, wheezing and stridor.   Cardiovascular: Negative.  Negative for chest pain, palpitations and leg swelling.  Gastrointestinal: Negative for nausea, vomiting, abdominal pain, diarrhea, constipation and blood in stool.  Endocrine: Negative.   Genitourinary: Negative.   Musculoskeletal: Positive for back pain and arthralgias. Negative for myalgias, joint swelling and gait problem.  Skin: Negative.   Allergic/Immunologic: Negative.   Neurological: Negative.  Negative for focal weakness.  Hematological: Negative.   Psychiatric/Behavioral: Negative.        Objective:   Physical Exam  Vitals reviewed. Constitutional: She is oriented to person, place, and time. She appears well-developed and well-nourished. No distress.  HENT:  Head: Normocephalic and atraumatic.  Mouth/Throat: Oropharynx is clear and moist. No oropharyngeal exudate.  Eyes: Conjunctivae are normal. Right eye exhibits no discharge. Left eye exhibits no discharge. No scleral icterus.  Neck: Normal range of  motion. Neck supple. No JVD present. No tracheal deviation present. No thyromegaly present.  Cardiovascular: Normal rate, regular rhythm, normal heart sounds and intact distal pulses.  Exam reveals no gallop and no friction rub.   No murmur heard. Pulmonary/Chest: Effort normal and breath sounds normal. No stridor. No respiratory distress. She has no wheezes. She has no rales.  Abdominal: Soft. Bowel sounds are normal. She exhibits no distension and no mass. There is no tenderness. There is no rebound and no guarding.  Musculoskeletal: Normal range of motion. She exhibits no edema and no tenderness.  Lymphadenopathy:    She has no cervical adenopathy.  Neurological: She is oriented to person, place, and time.  Skin: Skin is warm and dry. No rash noted. She is not diaphoretic. No erythema. No pallor.  Psychiatric: She has a normal mood and affect. Her behavior is normal. Judgment and thought content normal.     Lab Results  Component Value Date   WBC 5.4 02/01/2012   HGB 12.0 02/01/2012   HCT 36.9 02/01/2012   PLT 159.0 02/01/2012   GLUCOSE 91 09/05/2012   CHOL 232* 09/05/2012   TRIG 74.0 09/05/2012   HDL 64.60 09/05/2012   LDLDIRECT 158.9 09/05/2012   LDLCALC 66 02/01/2012   ALT 16 09/05/2012   AST 20 09/05/2012   NA 139 09/05/2012   K 4.3 09/05/2012   CL 103 09/05/2012   CREATININE 1.0 09/05/2012   BUN 16 09/05/2012   CO2 25 09/05/2012   TSH 1.97 09/05/2012   HGBA1C 5.6 08/10/2011       Assessment & Plan:

## 2013-01-05 NOTE — Patient Instructions (Signed)

## 2013-01-12 ENCOUNTER — Encounter: Payer: Self-pay | Admitting: Physical Medicine & Rehabilitation

## 2013-01-19 ENCOUNTER — Other Ambulatory Visit: Payer: Self-pay

## 2013-01-19 DIAGNOSIS — I10 Essential (primary) hypertension: Secondary | ICD-10-CM

## 2013-01-19 MED ORDER — QUINAPRIL HCL 40 MG PO TABS: 40.0000 mg | ORAL_TABLET | Freq: Every day | ORAL | Status: DC

## 2013-02-16 ENCOUNTER — Encounter: Payer: Medicare Other | Attending: Physical Medicine & Rehabilitation

## 2013-02-16 ENCOUNTER — Ambulatory Visit (HOSPITAL_BASED_OUTPATIENT_CLINIC_OR_DEPARTMENT_OTHER): Payer: Self-pay | Admitting: Physical Medicine & Rehabilitation

## 2013-02-16 ENCOUNTER — Encounter: Payer: Self-pay | Admitting: Physical Medicine & Rehabilitation

## 2013-02-16 VITALS — BP 134/73 | HR 65 | Resp 14 | Ht 70.0 in | Wt 231.0 lb

## 2013-02-16 DIAGNOSIS — M961 Postlaminectomy syndrome, not elsewhere classified: Secondary | ICD-10-CM | POA: Insufficient documentation

## 2013-02-16 DIAGNOSIS — R209 Unspecified disturbances of skin sensation: Secondary | ICD-10-CM

## 2013-02-16 DIAGNOSIS — M549 Dorsalgia, unspecified: Secondary | ICD-10-CM | POA: Insufficient documentation

## 2013-02-16 DIAGNOSIS — Z5181 Encounter for therapeutic drug level monitoring: Secondary | ICD-10-CM

## 2013-02-16 DIAGNOSIS — M48061 Spinal stenosis, lumbar region without neurogenic claudication: Secondary | ICD-10-CM | POA: Insufficient documentation

## 2013-02-16 DIAGNOSIS — G8928 Other chronic postprocedural pain: Secondary | ICD-10-CM | POA: Insufficient documentation

## 2013-02-16 DIAGNOSIS — Z79899 Other long term (current) drug therapy: Secondary | ICD-10-CM

## 2013-02-16 NOTE — Patient Instructions (Signed)
Overall goal treatment is to reduce her narcotic analgesics Trial lumbar injections May need physical therapy as well

## 2013-02-16 NOTE — Progress Notes (Signed)
Subjective:    Patient ID: Claudia [name], female    DOB: 1934/04/27, 79 y.o.   MRN: [id number]  HPI CC low back back pain onset approximately 10 years ago worsening with time. No traumatic injury. Patient was first treated with physical therapy and injections. Is is not helpful and she underwent lumbar spine surgery 02/12/2011 Dr Gerlene Fee L4-5 discectomy, pedicle screws The long sitting and prolonged standing exacerbate pain. Pain medication is partially helpful. ~ 50-75% pain relief with medications Pain Inventory Average Pain 7 Pain Right Now 7 My pain is aching  In the last 24 hours, has pain interfered with the following? General activity 2 Relation with others 2 Enjoyment of life 8 What TIME of day is your pain at its worst? daytime and night Sleep (in general) Fair  Pain is worse with: walking Pain improves with: rest and medication Relief from Meds: 7  Mobility use a cane how many minutes can you walk? 15 do you drive?  yes  Function not employed: date last employed 06/11/2010  Neuro/Psych numbness tingling trouble walking  Prior Studies Any changes since last visit?  no  Physicians involved in your care Any changes since last visit?  no   Family History  Problem Relation Age of Onset  . Heart disease Father   . Hypertension Mother   . Alzheimer's disease Mother   . Cancer Neg Hx   . Kidney disease Neg Hx   . Diabetes Sister   . Cirrhosis Brother    History   Social History  . Marital Status: Married    Spouse Name: N/A    Number of Children: N/A  . Years of Education: N/A   Social History Main Topics  . Smoking status: Never Smoker   . Smokeless tobacco: Never Used  . Alcohol Use: No     Comment: occasional  . Drug Use: No  . Sexual Activity: No   Other Topics Concern  . None   Social History Narrative  . None   Past Surgical History  Procedure Laterality Date  . Right knee replacement  03/13/2010  . Lumbar laminectomy   03/13/2010  . Abdominal hysterectomy  03/13/2010   Past Medical History  Diagnosis Date  . Osteoarthritis   . Pure hypercholesterolemia   . Essential hypertension, benign   . Gouty arthropathy   . Lumbago   . Degeneration of lumbar or lumbosacral intervertebral disc   . Brachial neuritis or radiculitis NOS   . Unspecified hypothyroidism   . Obesity    BP 134/73  Pulse 65  Resp 14  Ht 5\' 10"  (1.778 m)  Wt 231 lb (104.781 kg)  BMI 33.15 kg/m2  SpO2 98%    Review of Systems  Gastrointestinal: Positive for constipation.  Musculoskeletal: Positive for gait problem.  Neurological: Positive for numbness.       Tingling  All other systems reviewed and are negative.       Objective:   Physical Exam  Nursing note and vitals reviewed. Constitutional: She appears well-developed and well-nourished.  HENT:  Head: Normocephalic and atraumatic.  Eyes: Conjunctivae and EOM are normal. Pupils are equal, round, and reactive to light.  Neck: Normal range of motion.  Musculoskeletal:       Lumbar back: She exhibits decreased range of motion. She exhibits no tenderness.  Pain with extension of the lumbar spine. Only 25% of normal extension. Flexion is an 85% and without pain also pain with right greater than left lateral bending Patient indicates  that the area of the pain is just above the lumbar incision  Neurological: She has normal strength. A sensory deficit is present. Gait abnormal.  Paresthesias to touch in the feet Decreased sensation in her toes compared to knee Forward flexed ambulation  Psychiatric: She has a normal mood and affect.    General no acute distress      Assessment & Plan:  1.lumbar postlaminectomy syndrome with chronic postoperative pain. She has both axial back pain as well as tingling sensation in her feet. She feels like the tingling sensation is somehow related to her back. She has no history of diabetes. She does have a neurology evaluation next  week. Given her history of lumbar stenosis and decompressive surgery will repeat MRI to further evaluate  For her pain we will schedule for lumbar medial branch blocks We'll try to minimize his of narcotic analgesics given that she is complaining of some problems with her balance. Currently taking about 2 hydrocodone per day She has tried Neurontin in the past but this caused confusion Further recommendations based on MRI result as well as neurology evaluation.

## 2013-02-20 ENCOUNTER — Ambulatory Visit (INDEPENDENT_AMBULATORY_CARE_PROVIDER_SITE_OTHER): Payer: Medicare Other | Admitting: Neurology

## 2013-02-20 ENCOUNTER — Encounter: Payer: Self-pay | Admitting: Neurology

## 2013-02-20 VITALS — BP 138/72 | HR 60 | Wt 230.0 lb

## 2013-02-20 DIAGNOSIS — G609 Hereditary and idiopathic neuropathy, unspecified: Secondary | ICD-10-CM

## 2013-02-20 DIAGNOSIS — G629 Polyneuropathy, unspecified: Secondary | ICD-10-CM

## 2013-02-20 DIAGNOSIS — R269 Unspecified abnormalities of gait and mobility: Secondary | ICD-10-CM

## 2013-02-20 NOTE — Progress Notes (Signed)
Reason for visit: Peripheral neuropathy  Claudia [name] is an 79 y.o. female  History of present illness:  Claudia [name] is a 79 year old right-handed black female with a history of a peripheral neuropathy. The patient has been placed on Cymbalta and Trileptal when last seen. The patient indicates that the Trileptal caused confusion, and she went off the medication. The patient has also stopped the Cymbalta, and she indicates that over time, her neuropathy pain has actually improved. The patient has seen a podiatrist for her hammertoes on the right foot, and so far she has not decided about surgery. The patient has not had any falls since last seen. The patient is careful about ambulation, and she has a shower chair to maintain balance. The patient is sleeping fairly well at night. The patient returns for an evaluation.  Past Medical History  Diagnosis Date  . Osteoarthritis   . Pure hypercholesterolemia   . Essential hypertension, benign   . Gouty arthropathy   . Lumbago   . Degeneration of lumbar or lumbosacral intervertebral disc   . Brachial neuritis or radiculitis NOS   . Unspecified hypothyroidism   . Obesity   . Abnormality of gait     Past Surgical History  Procedure Laterality Date  . Right knee replacement  03/13/2010  . Lumbar laminectomy  03/13/2010  . Abdominal hysterectomy  03/13/2010    Family History  Problem Relation Age of Onset  . Heart disease Father   . Hypertension Mother   . Alzheimer's disease Mother   . Cancer Neg Hx   . Kidney disease Neg Hx   . Diabetes Sister   . Cirrhosis Brother     Social history:  reports that she has never smoked. She has never used smokeless tobacco. She reports that she does not drink alcohol or use illicit drugs.    Allergies  Allergen Reactions  . Lipitor [Atorvastatin]     Muscle aches  . Trileptal [Oxcarbazepine]     confusion    Medications:  Current Outpatient Prescriptions on File Prior to Visit   Medication Sig Dispense Refill  . cetirizine (ZYRTEC) 10 MG tablet Take 10 mg by mouth daily.      [name] ezetimibe (ZETIA) 10 MG tablet Take 1 tablet (10 mg total) by mouth daily.  90 tablet  3  . HYDROcodone-acetaminophen (NORCO/VICODIN) 5-325 MG per tablet take 1 tablet by mouth every 6 hours if needed for pain  90 tablet  3  . Multiple Vitamins-Minerals (ULTRA WOMENS PACK) MISC Take 1 tablet by mouth daily.      . quinapril (ACCUPRIL) 40 MG tablet Take 1 tablet (40 mg total) by mouth at bedtime.  90 tablet  3  . torsemide (DEMADEX) 20 MG tablet Take 20 mg by mouth daily. As needed       No current facility-administered medications on file prior to visit.    ROS:  Out of a complete 14 system review of symptoms, the patient complains only of the following symptoms, and all other reviewed systems are negative.  Swelling in the legs Foot pain Gait disturbance  Blood pressure 138/72, pulse 60, weight 230 lb (104.327 kg).  Physical Exam  General: The patient is alert and cooperative at the time of the examination. The patient is moderately obese.  Skin: No significant peripheral edema is noted.   Neurologic Exam  Cranial nerves: Facial symmetry is present. Speech is normal, no aphasia or dysarthria is noted. Extraocular movements are full. Visual fields are  full.  Motor: The patient has good strength in all 4 extremities.  Coordination: The patient has good finger-nose-finger and heel-to-shin bilaterally.  Gait and station: The patient has a slightly wide-based, unsteady gait. The patient walks without a cane or a walker. Tandem gait is unsteady. Romberg is negative. No drift is seen.  Reflexes: Deep tendon reflexes are symmetric, but are depressed.   Assessment/Plan:  One. Peripheral neuropathy  2. Gait disturbance  The patient is doing fairly well at this point without any medications for pain. The patient will be managed conservatively at this point. The patient will  followup if needed, and she will contact our office if pain management is required.  Claudia Palau MD 02/20/2013 7:59 PM  [facility] [address] [place], [place] [address]  Phone [phone] Fax [phone]

## 2013-02-27 ENCOUNTER — Ambulatory Visit
Admission: RE | Admit: 2013-02-27 | Discharge: 2013-02-27 | Disposition: A | Discharge status: Home / Self Care | Payer: Medicare Other | Source: Ambulatory Visit | Attending: Physical Medicine & Rehabilitation | Admitting: Physical Medicine & Rehabilitation

## 2013-02-27 DIAGNOSIS — M961 Postlaminectomy syndrome, not elsewhere classified: Secondary | ICD-10-CM

## 2013-02-27 DIAGNOSIS — R209 Unspecified disturbances of skin sensation: Secondary | ICD-10-CM

## 2013-02-27 MED ORDER — GADOBENATE DIMEGLUMINE 529 MG/ML IV SOLN
20.0000 mL | Freq: Once | INTRAVENOUS | Status: AC | PRN
  Administered 2013-02-27: 20 mL via INTRAVENOUS

## 2013-02-27 MED ORDER — GADOBENATE DIMEGLUMINE 529 MG/ML IV SOLN: 20.0000 mL | Freq: Once | INTRAVENOUS | Status: AC | PRN

## 2013-02-28 ENCOUNTER — Telehealth: Payer: Self-pay

## 2013-02-28 NOTE — Telephone Encounter (Signed)
Message copied by Judd Gaudier on Wed Feb 28, 2013  9:21 AM ------      Message from: Su Monks      Created: Fri Feb 23, 2013 11:11 AM       Please ask patient about this, ------

## 2013-02-28 NOTE — Telephone Encounter (Signed)
Patient originally said she did not take tramadol or have a script for it.  She then admitted to taking her aunts medication.  Educated her on risk of taking others medication.

## 2013-03-13 ENCOUNTER — Encounter: Payer: Medicare Other | Attending: Physical Medicine & Rehabilitation

## 2013-03-13 ENCOUNTER — Ambulatory Visit (HOSPITAL_BASED_OUTPATIENT_CLINIC_OR_DEPARTMENT_OTHER): Payer: Medicare Other | Admitting: Physical Medicine & Rehabilitation

## 2013-03-13 ENCOUNTER — Encounter: Payer: Self-pay | Admitting: Physical Medicine & Rehabilitation

## 2013-03-13 VITALS — BP 159/78 | HR 55 | Resp 14 | Ht 70.0 in | Wt 232.6 lb

## 2013-03-13 DIAGNOSIS — R209 Unspecified disturbances of skin sensation: Secondary | ICD-10-CM | POA: Insufficient documentation

## 2013-03-13 DIAGNOSIS — M961 Postlaminectomy syndrome, not elsewhere classified: Secondary | ICD-10-CM | POA: Insufficient documentation

## 2013-03-13 DIAGNOSIS — G8928 Other chronic postprocedural pain: Secondary | ICD-10-CM | POA: Insufficient documentation

## 2013-03-13 DIAGNOSIS — M47817 Spondylosis without myelopathy or radiculopathy, lumbosacral region: Secondary | ICD-10-CM

## 2013-03-13 DIAGNOSIS — M48061 Spinal stenosis, lumbar region without neurogenic claudication: Secondary | ICD-10-CM | POA: Insufficient documentation

## 2013-03-13 DIAGNOSIS — M549 Dorsalgia, unspecified: Secondary | ICD-10-CM | POA: Insufficient documentation

## 2013-03-13 NOTE — Patient Instructions (Addendum)
Lumbar medial branch blocks dexamethasone adn lidocaine  Please monitor your pain levels

## 2013-03-13 NOTE — Progress Notes (Signed)
Bilateral T 12, L1, L2 medial branch blocks under fluoroscopic guidance  Indication: Lumbar pain which is not relieved by medication management or other conservative care and interfering with self-care and mobility.Recent MRI reviewed evidence of L2-3,L3-4 facet arthropathy/spondylosis  Informed consent was obtained after describing risks and benefits of the procedure with the patient, this includes bleeding, bruising, infection, paralysis and medication side effects. The patient wishes to proceed and has given written consent. The patient was placed in a prone position. The lumbar area was marked and prepped with Betadine. One ML of 1% lidocaine was injected into each of 6 areas into the skin and subcutaneous tissue. Then a 22-gauge 3.5 inch spinal needle was inserted targeting the junction of the left L3 superior articular process /transverse process junction. Needle was advanced under fluoroscopic guidance. Bone contact was made. Omnipaque 180 was injected x0.5 mL demonstrating no intravascular uptake. Then a solution containing one ML of 4 mg per mL dexamethasone and 3 mL of 2% MPF lidocaine was injected x0.5 mL. Then the left L2 superior articular process in transverse process junction was targeted. Bone contact was made. Omnipaque 180 was injected x0.5 mL demonstrating no intravascular uptake. Then a solution containing one ML of 4 mg per mL dexamethasone and 3 mL of 2% MPF lidocaine was injected x0.5 mL. Then the left L1 superior articular process in transverse process junction was targeted. Bone contact was made. Omnipaque 180 was injected x0.5 mL demonstrating no intravascular uptake. Then a solution containing one ML of 4 mg per mL dexamethasone and 3 mL of 2% MPF lidocaine was injected x0.5 mL. this same procedure was performed on the right side using the same needle, technique, and injectate. Patient tolerated procedure well. Post procedure instructions were given. Please refer to post procedure  form.

## 2013-03-13 NOTE — Progress Notes (Signed)
  PROCEDURE RECORD [facility]   Name: ANALISSE RANDLE DOB:29-Jul-1933 MRN: [id number]  Date:03/13/2013  Physician: Claudette Laws, MD    Nurse/CMA: Shumaker RN  Allergies:  Allergies  Allergen Reactions  . Lipitor [Atorvastatin]     Muscle aches  . Trileptal [Oxcarbazepine]     confusion    Consent Signed: yes  Is patient diabetic? no  CBG today?   Pregnant: no LMP: No LMP recorded. Patient has had a hysterectomy. (age [age])  Anticoagulants: no Anti-inflammatory: no Antibiotics: no  Procedure: Bilateral T 12, L 1-2  Medial Branch Blocks Position: Prone Start Time:2:06  End Time: 2:16  Fluoro Time:36 sec   RN/CMA Haematologist RN    Time 1335 14:20    BP 159/78 188/63    Pulse 55 57    Respirations 14 14    O2 Sat 99 97    S/S 6 6    Pain Level 6/10 4/10     D/C home with Bonita Quin, patient A & O X 3, D/C instructions reviewed, and sits independently.

## 2013-04-09 ENCOUNTER — Ambulatory Visit (HOSPITAL_BASED_OUTPATIENT_CLINIC_OR_DEPARTMENT_OTHER): Payer: Medicare Other | Admitting: Physical Medicine & Rehabilitation

## 2013-04-09 ENCOUNTER — Encounter: Payer: Self-pay | Admitting: Physical Medicine & Rehabilitation

## 2013-04-09 ENCOUNTER — Encounter: Payer: Medicare Other | Attending: Physical Medicine & Rehabilitation

## 2013-04-09 VITALS — BP 130/61 | HR 61 | Resp 14 | Ht 70.0 in | Wt 234.8 lb

## 2013-04-09 DIAGNOSIS — M961 Postlaminectomy syndrome, not elsewhere classified: Secondary | ICD-10-CM | POA: Insufficient documentation

## 2013-04-09 DIAGNOSIS — M47817 Spondylosis without myelopathy or radiculopathy, lumbosacral region: Secondary | ICD-10-CM

## 2013-04-09 DIAGNOSIS — R209 Unspecified disturbances of skin sensation: Secondary | ICD-10-CM | POA: Insufficient documentation

## 2013-04-09 DIAGNOSIS — M549 Dorsalgia, unspecified: Secondary | ICD-10-CM | POA: Insufficient documentation

## 2013-04-09 DIAGNOSIS — M48061 Spinal stenosis, lumbar region without neurogenic claudication: Secondary | ICD-10-CM | POA: Insufficient documentation

## 2013-04-09 DIAGNOSIS — G8928 Other chronic postprocedural pain: Secondary | ICD-10-CM | POA: Insufficient documentation

## 2013-04-09 NOTE — Patient Instructions (Signed)
Will repeat medial branch blocks. If a short-term benefit, will recommend radio frequency ablation

## 2013-04-09 NOTE — Progress Notes (Signed)
Subjective:    Patient ID: Claudia [name], female    DOB: [birth year], 79 y.o.   MRN: [id number]  HPI Bilateral T 12, L1, L2 medial branch blocks under fluoroscopic guidance on 9/16 Pain relief of approximately 50% lasting for approximately 2 weeks. She did not take any pain medicine for 13 days. Now taking about 2 hydrocodone / day No change in symptoms. Pain is mainly above her surgical scars in the low back area Pain Inventory Average Pain 2 Pain Right Now 2 My pain is dull  In the last 24 hours, has pain interfered with the following? General activity 2 Relation with others 2 Enjoyment of life 2 What TIME of day is your pain at its worst? daytime Sleep (in general) Fair  Pain is worse with: walking, standing and some activites Pain improves with: medication Relief from Meds: 8  Mobility walk without assistance how many minutes can you walk? 20 Do you have any goals in this area?  yes  Function not employed: date last employed na  Neuro/Psych No problems in this area  Prior Studies Any changes since last visit?  no  Physicians involved in your care Any changes since last visit?  no   Family History  Problem Relation Age of Onset  . Heart disease Father   . Hypertension Mother   . Alzheimer's disease Mother   . Cancer Neg Hx   . Kidney disease Neg Hx   . Diabetes Sister   . Cirrhosis Brother    History   Social History  . Marital Status: Married    Spouse Name: N/A    Number of Children: N/A  . Years of Education: N/A   Social History Main Topics  . Smoking status: Never Smoker   . Smokeless tobacco: Never Used  . Alcohol Use: No     Comment: occasional  . Drug Use: No  . Sexual Activity: No   Other Topics Concern  . None   Social History Narrative  . None   Past Surgical History  Procedure Laterality Date  . Right knee replacement  03/13/2010  . Lumbar laminectomy  03/13/2010  . Abdominal hysterectomy  03/13/2010   Past Medical  History  Diagnosis Date  . Osteoarthritis   . Pure hypercholesterolemia   . Essential hypertension, benign   . Gouty arthropathy   . Lumbago   . Degeneration of lumbar or lumbosacral intervertebral disc   . Brachial neuritis or radiculitis NOS   . Unspecified hypothyroidism   . Obesity   . Abnormality of gait    BP 130/61  Pulse 61  Resp 14  Ht 5\' 10"  (1.778 m)  Wt 234 lb 12.8 oz (106.505 kg)  BMI 33.69 kg/m2  SpO2 98%      Review of Systems  Gastrointestinal: Positive for constipation.  Musculoskeletal: Positive for back pain.       Objective:   Physical Exam  Tenderness to palpation at L4 to inferior aspect of rib cage.  Lower extremity strength is normal Mood and affect are appropriate Lumbar range of motion reduced with extension. Normal flexion. Pain with extension Ambulation with forward flexed posture      Assessment & Plan:  1. Lumbar postlaminectomy syndrome with chronic postoperative pain mainly axial back pain 2. Lumbar spondylosis at L2-3 and L3-4 which is responded nicely to medial branch blocks at corresponding levels. Recommend repeat medial branch blocks and if similar effect that is short lived, would be good candidate for radiofrequency neurotomy.  Patient will discuss her pain medication with primary care physician. PCP is currently prescribed hydrocodone. We can take this over if she plans to be on this on a longer-term basis. She may benefit from Tylenol #3 or perhaps tramadol given that she takes fairly low doses of hydrocodone and

## 2013-04-10 ENCOUNTER — Ambulatory Visit: Payer: Medicare Other

## 2013-04-17 ENCOUNTER — Telehealth: Payer: Self-pay

## 2013-04-17 ENCOUNTER — Ambulatory Visit (INDEPENDENT_AMBULATORY_CARE_PROVIDER_SITE_OTHER): Payer: Medicare Other

## 2013-04-17 DIAGNOSIS — Z23 Encounter for immunization: Secondary | ICD-10-CM

## 2013-04-17 NOTE — Telephone Encounter (Signed)
Patient was seen in flu clinic on 04/17/2013. During visit she stated that she needed a refill on her Norco. Patient was last seen on 04/09/2013 and the medication was last filled on 12/25/2012.

## 2013-04-18 MED ORDER — HYDROCODONE-ACETAMINOPHEN 5-325 MG PO TABS: 1.0000 | ORAL_TABLET | Freq: Four times a day (QID) | ORAL | Status: DC | PRN

## 2013-04-18 NOTE — Addendum Note (Signed)
Addended by: Etta Grandchild on: 04/18/2013 09:27 AM   Modules accepted: Orders

## 2013-05-08 ENCOUNTER — Encounter: Payer: Medicare Other | Attending: Physical Medicine & Rehabilitation

## 2013-05-08 ENCOUNTER — Encounter: Payer: Self-pay | Admitting: Physical Medicine & Rehabilitation

## 2013-05-08 ENCOUNTER — Ambulatory Visit (HOSPITAL_BASED_OUTPATIENT_CLINIC_OR_DEPARTMENT_OTHER): Payer: Medicare Other | Admitting: Physical Medicine & Rehabilitation

## 2013-05-08 VITALS — BP 153/80 | HR 58 | Resp 14 | Ht 70.0 in | Wt 240.0 lb

## 2013-05-08 DIAGNOSIS — M549 Dorsalgia, unspecified: Secondary | ICD-10-CM | POA: Insufficient documentation

## 2013-05-08 DIAGNOSIS — R209 Unspecified disturbances of skin sensation: Secondary | ICD-10-CM | POA: Insufficient documentation

## 2013-05-08 DIAGNOSIS — M961 Postlaminectomy syndrome, not elsewhere classified: Secondary | ICD-10-CM | POA: Insufficient documentation

## 2013-05-08 DIAGNOSIS — M47817 Spondylosis without myelopathy or radiculopathy, lumbosacral region: Secondary | ICD-10-CM

## 2013-05-08 DIAGNOSIS — G8928 Other chronic postprocedural pain: Secondary | ICD-10-CM | POA: Insufficient documentation

## 2013-05-08 DIAGNOSIS — M48061 Spinal stenosis, lumbar region without neurogenic claudication: Secondary | ICD-10-CM | POA: Insufficient documentation

## 2013-05-08 MED ORDER — DIAZEPAM 10 MG PO TABS: 10.0000 mg | ORAL_TABLET | Freq: Once | ORAL | Status: DC

## 2013-05-08 MED ORDER — FENTANYL 50 MCG/HR TD PT72: 50.0000 ug | MEDICATED_PATCH | TRANSDERMAL | Status: DC

## 2013-05-08 MED ORDER — HYDROCODONE-ACETAMINOPHEN 5-325 MG PO TABS: 1.0000 | ORAL_TABLET | Freq: Two times a day (BID) | ORAL | Status: DC

## 2013-05-08 NOTE — Progress Notes (Signed)
Bilateral T 12, L1, L2 medial branch blocks under fluoroscopic guidance  Indication: Lumbar pain which is not relieved by medication management or other conservative care and interfering with self-care and mobility.Recent MRI reviewed evidence of L2-3,L3-4 facet arthropathy/spondylosis  Informed consent was obtained after describing risks and benefits of the procedure with the patient, this includes bleeding, bruising, infection, paralysis and medication side effects. The patient wishes to proceed and has given written consent. The patient was placed in a prone position. The lumbar area was marked and prepped with Betadine. One ML of 1% lidocaine was injected into each of 6 areas into the skin and subcutaneous tissue. Then a 22-gauge 3.5 inch spinal needle was inserted targeting the junction of the left L3 superior articular process /transverse process junction. Needle was advanced under fluoroscopic guidance. Bone contact was made. Omnipaque 180 was injected x0.5 mL demonstrating no intravascular uptake. Then a solution containing one ML of 4 mg per mL dexamethasone and 3 mL of 2% MPF lidocaine was injected x0.5 mL. Then the left L2 superior articular process in transverse process junction was targeted. Bone contact was made. Omnipaque 180 was injected x0.5 mL demonstrating no intravascular uptake. Then a solution containing one ML of 4 mg per mL dexamethasone and 3 mL of 2% MPF lidocaine was injected x0.5 mL. Then the left L1 superior articular process in transverse process junction was targeted. Bone contact was made. Omnipaque 180 was injected x0.5 mL demonstrating no intravascular uptake. Then a solution containing one ML of 4 mg per mL dexamethasone and 3 mL of 2% MPF lidocaine was injected x0.5 mL. this same procedure was performed on the right side using the same needle, technique, and injectate. Patient tolerated procedure well. Post procedure instructions were given. Please refer to post procedure  form.

## 2013-05-08 NOTE — Patient Instructions (Signed)

## 2013-05-08 NOTE — Progress Notes (Signed)
  PROCEDURE RECORD [facility]   Name: Claudia [name] DOB:1934/03/05 MRN: [id number]  Date:05/08/2013  Physician: Claudette Laws, MD    Nurse/CMA: Felipa Eth CMA  Allergies:  Allergies  Allergen Reactions  . Lipitor [Atorvastatin]     Muscle aches  . Trileptal [Oxcarbazepine]     confusion    Consent Signed: yes  Is patient diabetic? no  CBG today?   Pregnant: no LMP: No LMP recorded. Patient has had a hysterectomy. (age 36-55)  Anticoagulants: no Anti-inflammatory: no Antibiotics: no  Procedure: Bilateral medial branch block T12, L1, L2  Position: Prone Start Time: 249 End Time:  305 Fluoro Time: 43   RN/CMA Halee Glynn CMA Levens CMA    Time 2:08 308    BP 153/80 179/74    Pulse 58 59    Respirations 14 14    O2 Sat 97 92    S/S 6 6    Pain Level 8/10 4/10     D/C home with sister, patient A & O X 3, D/C instructions reviewed, and sits independently.

## 2013-05-15 ENCOUNTER — Telehealth: Payer: Self-pay

## 2013-05-15 NOTE — Telephone Encounter (Signed)
Patient will keep follow up appointment.

## 2013-05-15 NOTE — Telephone Encounter (Signed)
Have her call  if they were off

## 2013-05-15 NOTE — Telephone Encounter (Signed)
Ms Wax says that the injections went wonderful and she has not had any pain since.

## 2013-05-15 NOTE — Telephone Encounter (Signed)
Patient called to update [place] on how the injection went.  Please call.

## 2013-05-22 ENCOUNTER — Telehealth: Payer: Self-pay | Admitting: *Deleted

## 2013-05-22 MED ORDER — HYDROCODONE-ACETAMINOPHEN 5-325 MG PO TABS: 1.0000 | ORAL_TABLET | Freq: Four times a day (QID) | ORAL | Status: DC | PRN

## 2013-05-22 NOTE — Telephone Encounter (Signed)
Pt called requesting refill on Hydrocodone.  Please advise.

## 2013-05-22 NOTE — Telephone Encounter (Signed)
done

## 2013-05-23 NOTE — Telephone Encounter (Signed)
Spoke with pt advised Rx ready for pick up 

## 2013-06-04 ENCOUNTER — Telehealth: Payer: Self-pay

## 2013-06-04 NOTE — Telephone Encounter (Signed)
Patient called to see if she needs a driver for her appointment Thursday.  She says the injection has helped and she does not need to come in for another one yet.

## 2013-06-07 ENCOUNTER — Ambulatory Visit: Payer: Medicare Other | Admitting: Physical Medicine & Rehabilitation

## 2013-06-26 ENCOUNTER — Telehealth: Payer: Self-pay

## 2013-06-26 DIAGNOSIS — M47817 Spondylosis without myelopathy or radiculopathy, lumbosacral region: Secondary | ICD-10-CM

## 2013-06-26 DIAGNOSIS — M1710 Unilateral primary osteoarthritis, unspecified knee: Secondary | ICD-10-CM

## 2013-06-26 DIAGNOSIS — M961 Postlaminectomy syndrome, not elsewhere classified: Secondary | ICD-10-CM

## 2013-06-26 DIAGNOSIS — G629 Polyneuropathy, unspecified: Secondary | ICD-10-CM

## 2013-06-26 DIAGNOSIS — M545 Low back pain: Secondary | ICD-10-CM

## 2013-06-26 MED ORDER — HYDROCODONE-ACETAMINOPHEN 5-325 MG PO TABS: 1.0000 | ORAL_TABLET | Freq: Four times a day (QID) | ORAL | Status: DC | PRN

## 2013-06-26 NOTE — Telephone Encounter (Signed)
Rx was written this time She will have to be seen before anymore will be written by me

## 2013-06-26 NOTE — Telephone Encounter (Signed)
The pt called and is hoping to get a refill on her hydrocodone. Thanks!

## 2013-07-09 ENCOUNTER — Other Ambulatory Visit: Payer: Self-pay | Admitting: Internal Medicine

## 2013-07-09 ENCOUNTER — Other Ambulatory Visit (INDEPENDENT_AMBULATORY_CARE_PROVIDER_SITE_OTHER): Payer: Medicare Other

## 2013-07-09 ENCOUNTER — Encounter: Payer: Self-pay | Admitting: Internal Medicine

## 2013-07-09 ENCOUNTER — Ambulatory Visit (INDEPENDENT_AMBULATORY_CARE_PROVIDER_SITE_OTHER): Payer: Medicare Other | Admitting: Internal Medicine

## 2013-07-09 VITALS — BP 120/70 | HR 54 | Temp 97.3°F | Resp 16 | Ht 70.0 in | Wt 238.0 lb

## 2013-07-09 DIAGNOSIS — M545 Low back pain, unspecified: Secondary | ICD-10-CM

## 2013-07-09 DIAGNOSIS — M179 Osteoarthritis of knee, unspecified: Secondary | ICD-10-CM

## 2013-07-09 DIAGNOSIS — M961 Postlaminectomy syndrome, not elsewhere classified: Secondary | ICD-10-CM

## 2013-07-09 DIAGNOSIS — G609 Hereditary and idiopathic neuropathy, unspecified: Secondary | ICD-10-CM

## 2013-07-09 DIAGNOSIS — E7800 Pure hypercholesterolemia, unspecified: Secondary | ICD-10-CM

## 2013-07-09 DIAGNOSIS — IMO0002 Reserved for concepts with insufficient information to code with codable children: Secondary | ICD-10-CM

## 2013-07-09 DIAGNOSIS — E039 Hypothyroidism, unspecified: Secondary | ICD-10-CM

## 2013-07-09 DIAGNOSIS — I10 Essential (primary) hypertension: Secondary | ICD-10-CM

## 2013-07-09 DIAGNOSIS — G629 Polyneuropathy, unspecified: Secondary | ICD-10-CM

## 2013-07-09 DIAGNOSIS — M1710 Unilateral primary osteoarthritis, unspecified knee: Secondary | ICD-10-CM

## 2013-07-09 DIAGNOSIS — M47817 Spondylosis without myelopathy or radiculopathy, lumbosacral region: Secondary | ICD-10-CM

## 2013-07-09 LAB — DRUGS OF ABUSE SCREEN W/O ALC, ROUTINE URINE

## 2013-07-09 LAB — URINALYSIS, ROUTINE W REFLEX MICROSCOPIC
Bilirubin Urine: NEGATIVE
HGB URINE DIPSTICK: NEGATIVE
Ketones, ur: NEGATIVE
NITRITE: NEGATIVE
RBC / HPF: NONE SEEN (ref 0–?)
SPECIFIC GRAVITY, URINE: 1.025 (ref 1.000–1.030)
Total Protein, Urine: NEGATIVE
URINE GLUCOSE: NEGATIVE
UROBILINOGEN UA: 0.2 (ref 0.0–1.0)
pH: 7 (ref 5.0–8.0)

## 2013-07-09 LAB — COMPREHENSIVE METABOLIC PANEL
ALBUMIN: 3.9 g/dL (ref 3.5–5.2)
ALT: 12 U/L (ref 0–35)
AST: 20 U/L (ref 0–37)
Alkaline Phosphatase: 71 U/L (ref 39–117)
BUN: 21 mg/dL (ref 6–23)
CALCIUM: 9.7 mg/dL (ref 8.4–10.5)
CHLORIDE: 110 meq/L (ref 96–112)
CO2: 28 mEq/L (ref 19–32)
Creatinine, Ser: 1 mg/dL (ref 0.4–1.2)
GFR: 72.41 mL/min (ref >60.00)
GLUCOSE: 84 mg/dL (ref 70–99)
POTASSIUM: 4.6 meq/L (ref 3.5–5.1)
SODIUM: 143 meq/L (ref 135–145)
TOTAL PROTEIN: 6.6 g/dL (ref 6.0–8.3)
Total Bilirubin: 1.1 mg/dL (ref 0.3–1.2)

## 2013-07-09 LAB — LIPID PANEL
CHOL/HDL RATIO: 3
CHOLESTEROL: 200 mg/dL (ref 0–200)
HDL: 75.3 mg/dL (ref >39.00)
LDL Cholesterol: 115 mg/dL — ABNORMAL HIGH (ref 0–99)
TRIGLYCERIDES: 49 mg/dL (ref 0.0–149.0)
VLDL: 9.8 mg/dL (ref 0.0–40.0)

## 2013-07-09 LAB — TSH: TSH: 3.89 u[IU]/mL (ref 0.35–5.50)

## 2013-07-09 MED ORDER — HYDROCODONE-ACETAMINOPHEN 5-325 MG PO TABS: 1.0000 | ORAL_TABLET | Freq: Four times a day (QID) | ORAL | Status: DC | PRN

## 2013-07-09 MED ORDER — QUINAPRIL HCL 40 MG PO TABS: 40.0000 mg | ORAL_TABLET | Freq: Every day | ORAL | Status: DC

## 2013-07-09 MED ORDER — PITAVASTATIN CALCIUM 2 MG PO TABS: 1.0000 | ORAL_TABLET | Freq: Every day | ORAL | Status: DC

## 2013-07-09 NOTE — Assessment & Plan Note (Signed)
She will restart livalo I will recheck her FLP today

## 2013-07-09 NOTE — Patient Instructions (Signed)

## 2013-07-09 NOTE — Assessment & Plan Note (Signed)
I will recheck her TSH level today and will adjust her dose if needed 

## 2013-07-09 NOTE — Assessment & Plan Note (Signed)
Cont norco as needed for pain 

## 2013-07-09 NOTE — Progress Notes (Signed)
Pre visit review using our clinic review tool, if applicable. No additional management support is needed unless otherwise documented below in the visit note. 

## 2013-07-09 NOTE — Assessment & Plan Note (Signed)
Her BP is well controlled Today I will check her lytes and renal function 

## 2013-07-09 NOTE — Progress Notes (Signed)
   Subjective:    Patient ID: Claudia [name], female    DOB: March 06, 1934, 80 y.o.   MRN: [id number]  Hypertension This is a chronic problem. The current episode started more than 1 year ago. The problem has been gradually improving since onset. The problem is controlled. Pertinent negatives include no anxiety, blurred vision, chest pain, headaches, malaise/fatigue, neck pain, orthopnea, palpitations, peripheral edema, PND, shortness of breath or sweats. Past treatments include diuretics and ACE inhibitors. The current treatment provides significant improvement. There are no compliance problems.  Hypertensive end-organ damage includes a thyroid problem.      Review of Systems  Constitutional: Negative.  Negative for fever, chills, malaise/fatigue, diaphoresis, appetite change and fatigue.  HENT: Negative.   Eyes: Negative.  Negative for blurred vision.  Respiratory: Negative.  Negative for cough, choking, chest tightness, shortness of breath, wheezing and stridor.   Cardiovascular: Negative.  Negative for chest pain, palpitations, orthopnea, leg swelling and PND.  Gastrointestinal: Negative.  Negative for nausea, vomiting, abdominal pain, diarrhea, constipation and blood in stool.  Endocrine: Negative.   Genitourinary: Negative.   Musculoskeletal: Positive for back pain. Negative for arthralgias, gait problem, joint swelling, myalgias, neck pain and neck stiffness.  Skin: Negative.   Allergic/Immunologic: Negative.   Neurological: Negative.  Negative for headaches.  Hematological: Negative.  Negative for adenopathy. Does not bruise/bleed easily.  Psychiatric/Behavioral: Negative.        Objective:   Physical Exam  Vitals reviewed. Constitutional: She is oriented to person, place, and time. She appears well-developed and well-nourished. No distress.  HENT:  Head: Normocephalic and atraumatic.  Mouth/Throat: Oropharynx is clear and moist. No oropharyngeal exudate.  Eyes:  Conjunctivae are normal. Right eye exhibits no discharge. Left eye exhibits no discharge. No scleral icterus.  Neck: Normal range of motion. Neck supple. No JVD present. No tracheal deviation present. No thyromegaly present.  Cardiovascular: Normal rate, regular rhythm, normal heart sounds and intact distal pulses.  Exam reveals no gallop and no friction rub.   No murmur heard. Pulmonary/Chest: Effort normal and breath sounds normal. No stridor. No respiratory distress. She has no wheezes. She has no rales. She exhibits no tenderness.  Abdominal: Soft. Bowel sounds are normal. She exhibits no distension and no mass. There is no tenderness. There is no rebound and no guarding.  Musculoskeletal: Normal range of motion. She exhibits no edema and no tenderness.  Lymphadenopathy:    She has no cervical adenopathy.  Neurological: She is oriented to person, place, and time.  Skin: Skin is warm and dry. No rash noted. She is not diaphoretic. No erythema. No pallor.  Psychiatric: She has a normal mood and affect. Her behavior is normal. Judgment and thought content normal.     Lab Results  Component Value Date   WBC 5.4 02/01/2012   HGB 12.0 02/01/2012   HCT 36.9 02/01/2012   PLT 159.0 02/01/2012   GLUCOSE 91 09/05/2012   CHOL 232* 09/05/2012   TRIG 74.0 09/05/2012   HDL 64.60 09/05/2012   LDLDIRECT 158.9 09/05/2012   LDLCALC 66 02/01/2012   ALT 16 09/05/2012   AST 20 09/05/2012   NA 139 09/05/2012   K 4.3 09/05/2012   CL 103 09/05/2012   CREATININE 1.0 09/05/2012   BUN 16 09/05/2012   CO2 25 09/05/2012   TSH 1.97 09/05/2012   HGBA1C 5.6 08/10/2011       Assessment & Plan:

## 2013-07-11 LAB — DRUGS OF ABUSE SCREEN W/O ALC, ROUTINE URINE
AMPHETAMINE SCRN UR: NEGATIVE
BARBITURATE QUANT UR: NEGATIVE
BENZODIAZEPINES.: NEGATIVE
Cocaine Metabolites: NEGATIVE
Creatinine,U: 141.8 mg/dL
METHADONE: NEGATIVE
Marijuana Metabolite: NEGATIVE
Opiate Screen, Urine: POSITIVE — AB
PROPOXYPHENE: NEGATIVE
Phencyclidine (PCP): NEGATIVE

## 2013-07-13 LAB — OPIATES/OPIOIDS (LC/MS-MS)
Codeine Urine: NEGATIVE ng/mL
HEROIN (6-AM), UR: NEGATIVE ng/mL
Hydrocodone: 360 ng/mL
Hydromorphone: 140 ng/mL
MORPHINE: NEGATIVE ng/mL
NOROXYCODONE, UR: NEGATIVE ng/mL
Norhydrocodone, Ur: 2156 ng/mL
OXYCODONE, UR: NEGATIVE ng/mL
OXYMORPHONE, URINE: NEGATIVE ng/mL

## 2013-09-08 ENCOUNTER — Emergency Department (HOSPITAL_COMMUNITY): Payer: Medicare Other

## 2013-09-08 ENCOUNTER — Encounter (HOSPITAL_COMMUNITY): Payer: Self-pay | Admitting: Emergency Medicine

## 2013-09-08 ENCOUNTER — Emergency Department (HOSPITAL_COMMUNITY)
Admission: EM | Admit: 2013-09-08 | Discharge: 2013-09-08 | Disposition: A | Discharge status: Home / Self Care | Payer: Medicare Other | Attending: Emergency Medicine | Admitting: Emergency Medicine

## 2013-09-08 DIAGNOSIS — M1990 Unspecified osteoarthritis, unspecified site: Secondary | ICD-10-CM | POA: Insufficient documentation

## 2013-09-08 DIAGNOSIS — Z79899 Other long term (current) drug therapy: Secondary | ICD-10-CM | POA: Insufficient documentation

## 2013-09-08 DIAGNOSIS — I10 Essential (primary) hypertension: Secondary | ICD-10-CM | POA: Insufficient documentation

## 2013-09-08 DIAGNOSIS — E7800 Pure hypercholesterolemia, unspecified: Secondary | ICD-10-CM | POA: Insufficient documentation

## 2013-09-08 DIAGNOSIS — R0602 Shortness of breath: Secondary | ICD-10-CM | POA: Insufficient documentation

## 2013-09-08 DIAGNOSIS — E669 Obesity, unspecified: Secondary | ICD-10-CM | POA: Insufficient documentation

## 2013-09-08 LAB — I-STAT TROPONIN, ED: TROPONIN I, POC: 0.01 ng/mL (ref 0.00–0.08)

## 2013-09-08 LAB — CBC WITH DIFFERENTIAL/PLATELET
BASOS ABS: 0 10*3/uL (ref 0.0–0.1)
Basophils Relative: 1 % (ref 0–1)
EOS ABS: 0.3 10*3/uL (ref 0.0–0.7)
EOS PCT: 6 % — AB (ref 0–5)
HCT: 43 % (ref 36.0–46.0)
Hemoglobin: 14.3 g/dL (ref 12.0–15.0)
LYMPHS ABS: 1.6 10*3/uL (ref 0.7–4.0)
LYMPHS PCT: 32 % (ref 12–46)
MCH: 28.5 pg (ref 26.0–34.0)
MCHC: 33.3 g/dL (ref 30.0–36.0)
MCV: 85.8 fL (ref 78.0–100.0)
Monocytes Absolute: 0.4 10*3/uL (ref 0.1–1.0)
Monocytes Relative: 9 % (ref 3–12)
NEUTROS PCT: 53 % (ref 43–77)
Neutro Abs: 2.7 10*3/uL (ref 1.7–7.7)
PLATELETS: 172 10*3/uL (ref 150–400)
RBC: 5.01 MIL/uL (ref 3.87–5.11)
RDW: 14 % (ref 11.5–15.5)
WBC: 5 10*3/uL (ref 4.0–10.5)

## 2013-09-08 LAB — BASIC METABOLIC PANEL
BUN: 15 mg/dL (ref 6–23)
CALCIUM: 10 mg/dL (ref 8.4–10.5)
CO2: 21 meq/L (ref 19–32)
Chloride: 102 mEq/L (ref 96–112)
Creatinine, Ser: 0.83 mg/dL (ref 0.50–1.10)
GFR calc Af Amer: 77 mL/min — ABNORMAL LOW (ref >90)
GFR, EST NON AFRICAN AMERICAN: 66 mL/min — AB (ref >90)
Glucose, Bld: 73 mg/dL (ref 70–99)
POTASSIUM: 5.6 meq/L — AB (ref 3.7–5.3)
SODIUM: 139 meq/L (ref 137–147)

## 2013-09-08 LAB — D-DIMER, QUANTITATIVE: D-Dimer, Quant: 0.67 ug/mL-FEU — ABNORMAL HIGH (ref 0.00–0.48)

## 2013-09-08 LAB — PRO B NATRIURETIC PEPTIDE: PRO B NATRI PEPTIDE: 297.1 pg/mL (ref 0–450)

## 2013-09-08 MED ORDER — IOHEXOL 350 MG/ML SOLN
80.0000 mL | Freq: Once | INTRAVENOUS | Status: AC | PRN
  Administered 2013-09-08: 80 mL via INTRAVENOUS

## 2013-09-08 NOTE — ED Provider Notes (Signed)
CSN: [id number]     Arrival date & time 09/08/13  1434 History   First MD Initiated Contact with Patient 09/08/13 1441     Chief Complaint  Patient presents with  . Shortness of Breath     (Consider location/radiation/quality/duration/timing/severity/associated sxs/prior Treatment) Patient is a 80 y.o. female presenting with shortness of breath. The history is provided by the patient. No language interpreter was used.  Shortness of Breath Severity:  Mild Onset quality:  Gradual Duration:  1 day Context: not activity and not URI   Associated symptoms: no abdominal pain, no chest pain, no cough, no fever, no rash, no sore throat, no vomiting and no wheezing   Risk factors: obesity   Risk factors: no hx of PE/DVT and no tobacco use    Pt is a 80 year old female who present with shortness of breath. She reports that she was standing in the kitchen cooking this afternoon and began to feel short of breath. She denies chest pain or a cardiac history. She denies asthma, COPD or wheezing. She denies recent fever, chills or illness. She denies a history of DVT or PE. She has never smoked. She denies any injury or pain with inspiration.    Past Medical History  Diagnosis Date  . Osteoarthritis   . Pure hypercholesterolemia   . Essential hypertension, benign   . Gouty arthropathy   . Lumbago   . Degeneration of lumbar or lumbosacral intervertebral disc   . Brachial neuritis or radiculitis NOS   . Unspecified hypothyroidism   . Obesity   . Abnormality of gait    Past Surgical History  Procedure Laterality Date  . Right knee replacement  03/13/2010  . Lumbar laminectomy  03/13/2010  . Abdominal hysterectomy  03/13/2010   Family History  Problem Relation Age of Onset  . Heart disease Father   . Hypertension Mother   . Alzheimer's disease Mother   . Cancer Neg Hx   . Kidney disease Neg Hx   . Diabetes Sister   . Cirrhosis Brother    History  Substance Use Topics  . Smoking status:  Never Smoker   . Smokeless tobacco: Never Used  . Alcohol Use: No     Comment: social   OB History   Grav Para Term Preterm Abortions TAB SAB Ect Mult Living                 Review of Systems  Constitutional: Negative for fever.  HENT: Negative for sore throat.   Respiratory: Positive for shortness of breath. Negative for cough and wheezing.   Cardiovascular: Negative for chest pain.  Gastrointestinal: Negative for nausea, vomiting, abdominal pain and diarrhea.  Genitourinary: Negative for dysuria and urgency.  Musculoskeletal: Negative for gait problem.  Skin: Negative for rash.  All other systems reviewed and are negative.      Allergies  Lipitor and Trileptal  Home Medications   Current Outpatient Rx  Name  Route  Sig  Dispense  Refill  . cetirizine (ZYRTEC) 10 MG tablet   Oral   Take 10 mg by mouth daily.         [name] ezetimibe (ZETIA) 10 MG tablet   Oral   Take 1 tablet (10 mg total) by mouth daily.   90 tablet   3   . HYDROcodone-acetaminophen (NORCO/VICODIN) 5-325 MG per tablet   Oral   Take 1 tablet by mouth every 6 (six) hours as needed. Fill on on or after 09/06/13  90 tablet   0   . Multiple Vitamins-Minerals (ULTRA WOMENS PACK) MISC   Oral   Take 1 tablet by mouth daily.         . Pitavastatin Calcium (LIVALO) 2 MG TABS   Oral   Take 1 tablet (2 mg total) by mouth daily.   90 tablet   3   . quinapril (ACCUPRIL) 40 MG tablet   Oral   Take 1 tablet (40 mg total) by mouth at bedtime.   90 tablet   3   . torsemide (DEMADEX) 20 MG tablet   Oral   Take 20 mg by mouth daily. As needed          BP 195/68  Pulse 61  Temp(Src) 98.1 F (36.7 C) (Oral)  Resp 24  Ht 5\' 10"  (1.778 m)  Wt 242 lb (109.77 kg)  BMI 34.72 kg/m2  SpO2 100% Physical Exam  Nursing note and vitals reviewed. Constitutional: She is oriented to person, place, and time. She appears well-developed and well-nourished. No distress.  Well-appearing  HENT:  Head:  Normocephalic and atraumatic.  Eyes: Conjunctivae and EOM are normal.  Neck: Normal range of motion. Neck supple. No JVD present. No tracheal deviation present. No thyromegaly present.  Cardiovascular: Normal rate, regular rhythm, normal heart sounds and intact distal pulses.   Pulmonary/Chest: Effort normal and breath sounds normal. No respiratory distress. She has no wheezes. She has no rales.  Abdominal: Soft. Bowel sounds are normal. She exhibits no distension. There is no tenderness.  Musculoskeletal: Normal range of motion.  Lymphadenopathy:    She has no cervical adenopathy.  Neurological: She is alert and oriented to person, place, and time. No cranial nerve deficit. Coordination normal.  Skin: Skin is warm and dry. No rash noted. No erythema.  Psychiatric: She has a normal mood and affect. Her behavior is normal. Judgment and thought content normal.    ED Course  Procedures (including critical care time) Labs Review Labs Reviewed  [place], ED   Imaging Review No results found.   EKG Interpretation None      MDM   Final diagnoses:  Shortness of breath    Afebrile. No leukocytosis or history of recent illness. Slightly elevated d-dimer with negative CT angio, chest. Negative troponin and EKG within normal limits. Negative BNP.  Pt reports that she has not been taking her demadex with consistency because it makes her go to the bathroom too much. Discussed plan of care with pt and encouraged her to take meds as prescribed. Follow-up with primary MD on Monday. Return precautions given and she agrees. Pt reports that she is feeling better.Pt is stable for discharge.      Elisha Headland, NP 09/11/13 [phone]

## 2013-09-08 NOTE — ED Notes (Signed)
Pt presents to department for evaluation of SOB. Onset this afternoon while cooking. States she broke out into a sweat and began having trouble breathing. Denies chest pain/pressure. Respirations unlabored. Pt is alert and oriented x4.

## 2013-09-08 NOTE — Discharge Instructions (Signed)
Shortness of Breath Shortness of breath means you have trouble breathing. Shortness of breath may indicate that you have a medical problem. You should seek immediate medical care for shortness of breath. CAUSES   Not enough oxygen in the air (as with high altitudes or a smoke-filled room).  Short-term (acute) lung disease, including:  Infections, such as pneumonia.  Fluid in the lungs, such as heart failure.  A blood clot in the lungs (pulmonary embolism).  Long-term (chronic) lung diseases.  Heart disease (heart attack, angina, heart failure, and others).  Low red blood cells (anemia).  Poor physical fitness. This can cause shortness of breath when you exercise.  Chest or back injuries or stiffness.  Being overweight.  Smoking.  Anxiety. This can make you feel like you are not getting enough air. DIAGNOSIS  Serious medical problems can usually be found during your physical exam. Tests may also be done to determine why you are having shortness of breath. Tests may include:  Chest X-rays.  Lung function tests.  Blood tests.  Electrocardiography.  Exercise testing.  Echocardiography.  Imaging scans. Your caregiver may not be able to find a cause for your shortness of breath after your exam. In this case, it is important to have a follow-up exam with your caregiver as directed.  TREATMENT  Treatment for shortness of breath depends on the cause of your symptoms and can vary greatly. HOME CARE INSTRUCTIONS   Do not smoke. Smoking is a common cause of shortness of breath. If you smoke, ask for help to quit.  Avoid being around chemicals or things that may bother your breathing, such as paint fumes and dust.  Rest as needed. Slowly resume your usual activities.  If medicines were prescribed, take them as directed for the full length of time directed. This includes oxygen and any inhaled medicines.  Keep all follow-up appointments as directed by your caregiver. SEEK  MEDICAL CARE IF:   Your condition does not improve in the time expected.  You have a hard time doing your normal activities even with rest.  You have any side effects or problems with the medicines prescribed.  You develop any new symptoms. SEEK IMMEDIATE MEDICAL CARE IF:   Your shortness of breath gets worse.  You feel lightheaded, faint, or develop a cough not controlled with medicines.  You start coughing up blood.  You have pain with breathing.  You have chest pain or pain in your arms, shoulders, or abdomen.  You have a fever.  You are unable to walk up stairs or exercise the way you normally do. MAKE SURE YOU:  Understand these instructions.  Will watch your condition.  Will get help right away if you are not doing well or get worse. Document Released: 03/09/2001 Document Revised: 12/14/2011 Document Reviewed: 08/30/2011 [facility] Patient Information 2014 [place].   Take your demadex every day Eat potassium rich foods Follow-up with your doctor on Monday

## 2013-09-08 NOTE — ED Notes (Signed)
Pt in NAD, laying in bed with mild anxiety. Pt alert and oriented x4, steady gait, respirations equal and unlabored- albiet pt c/o SOB. Pt sts has had SOB starting at 1pm, while patient was cutting vegetable. Pt has had intermittent dizziness since episode that last a few seconds at a time. Pt. Sinus bradycardia, radial pulses 2+. Skin warm and dry. Pt endorses intermittent numbness and tingling in bilateral lower extremities x1 year- dx peripheral neuropathy. Pt sts she is concerned about blood clots in leg. RN informed Claiborne Billings NP.

## 2013-09-08 NOTE — ED Notes (Signed)
Patient denies dizziness, SOB, and CP at present time. sts "I kind feel normal right now." Pt.laying comfortably in bed with husband and bedside. HR- brady, BP- hypertensive but coming down.

## 2013-09-11 ENCOUNTER — Encounter: Payer: Self-pay | Admitting: Internal Medicine

## 2013-09-11 ENCOUNTER — Other Ambulatory Visit (INDEPENDENT_AMBULATORY_CARE_PROVIDER_SITE_OTHER): Payer: Medicare Other

## 2013-09-11 ENCOUNTER — Ambulatory Visit (INDEPENDENT_AMBULATORY_CARE_PROVIDER_SITE_OTHER): Payer: Medicare Other | Admitting: Internal Medicine

## 2013-09-11 VITALS — BP 120/70 | HR 72 | Temp 97.6°F | Resp 16 | Ht 70.0 in | Wt 244.8 lb

## 2013-09-11 DIAGNOSIS — E039 Hypothyroidism, unspecified: Secondary | ICD-10-CM

## 2013-09-11 DIAGNOSIS — M179 Osteoarthritis of knee, unspecified: Secondary | ICD-10-CM

## 2013-09-11 DIAGNOSIS — I10 Essential (primary) hypertension: Secondary | ICD-10-CM

## 2013-09-11 DIAGNOSIS — K5900 Constipation, unspecified: Secondary | ICD-10-CM

## 2013-09-11 DIAGNOSIS — M47817 Spondylosis without myelopathy or radiculopathy, lumbosacral region: Secondary | ICD-10-CM

## 2013-09-11 DIAGNOSIS — K5909 Other constipation: Secondary | ICD-10-CM

## 2013-09-11 DIAGNOSIS — M545 Low back pain, unspecified: Secondary | ICD-10-CM

## 2013-09-11 DIAGNOSIS — K5904 Chronic idiopathic constipation: Secondary | ICD-10-CM | POA: Insufficient documentation

## 2013-09-11 DIAGNOSIS — G629 Polyneuropathy, unspecified: Secondary | ICD-10-CM

## 2013-09-11 DIAGNOSIS — M1710 Unilateral primary osteoarthritis, unspecified knee: Secondary | ICD-10-CM

## 2013-09-11 DIAGNOSIS — M961 Postlaminectomy syndrome, not elsewhere classified: Secondary | ICD-10-CM

## 2013-09-11 LAB — COMPREHENSIVE METABOLIC PANEL
ALBUMIN: 4.2 g/dL (ref 3.5–5.2)
ALT: 15 U/L (ref 0–35)
AST: 18 U/L (ref 0–37)
Alkaline Phosphatase: 71 U/L (ref 39–117)
BUN: 28 mg/dL — AB (ref 6–23)
CALCIUM: 10 mg/dL (ref 8.4–10.5)
CHLORIDE: 104 meq/L (ref 96–112)
CO2: 27 mEq/L (ref 19–32)
CREATININE: 1.4 mg/dL — AB (ref 0.4–1.2)
GFR: 47.22 mL/min — AB (ref >60.00)
GLUCOSE: 91 mg/dL (ref 70–99)
Potassium: 4.7 mEq/L (ref 3.5–5.1)
Sodium: 140 mEq/L (ref 135–145)
Total Bilirubin: 1 mg/dL (ref 0.3–1.2)
Total Protein: 7.1 g/dL (ref 6.0–8.3)

## 2013-09-11 LAB — TSH: TSH: 2.93 u[IU]/mL (ref 0.35–5.50)

## 2013-09-11 MED ORDER — HYDROCODONE-ACETAMINOPHEN 5-325 MG PO TABS: 1.0000 | ORAL_TABLET | Freq: Four times a day (QID) | ORAL | Status: DC | PRN

## 2013-09-11 MED ORDER — LINACLOTIDE 145 MCG PO CAPS: 145.0000 ug | ORAL_CAPSULE | Freq: Every day | ORAL | Status: DC

## 2013-09-11 MED ORDER — AZILSARTAN-CHLORTHALIDONE 40-25 MG PO TABS: 1.0000 | ORAL_TABLET | Freq: Every day | ORAL | Status: DC

## 2013-09-11 NOTE — Assessment & Plan Note (Signed)
She will cont norco as needed for pain 

## 2013-09-11 NOTE — Assessment & Plan Note (Signed)
Due to side effects, I will change her to [facility] for BP control Her lytes and renal function are normal today

## 2013-09-11 NOTE — Progress Notes (Signed)
Pre visit review using our clinic review tool, if applicable. No additional management support is needed unless otherwise documented below in the visit note. 

## 2013-09-11 NOTE — Assessment & Plan Note (Signed)
Her TSH is in the normal range She will stay on the same dose 

## 2013-09-11 NOTE — Assessment & Plan Note (Signed)
Her labs show no secondary causes for this but the opiate therapy is She will try linzess to treat this

## 2013-09-11 NOTE — Patient Instructions (Signed)

## 2013-09-11 NOTE — Progress Notes (Signed)
   Subjective:    Patient ID: Claudia [name], female    DOB: 1933-12-28, 80 y.o.   MRN: [id number]  Hypertension This is a chronic problem. The current episode started more than 1 year ago. The problem is controlled. Pertinent negatives include no anxiety, blurred vision, chest pain, headaches, malaise/fatigue, neck pain, orthopnea, palpitations, peripheral edema, PND, shortness of breath or sweats. Past treatments include diuretics and ACE inhibitors. The current treatment provides moderate improvement. Compliance problems include medication side effects (she feels like lasix causes cramps).       Review of Systems  Constitutional: Negative.  Negative for fever, chills, malaise/fatigue, diaphoresis, appetite change and fatigue.  HENT: Negative.   Eyes: Negative.  Negative for blurred vision.  Respiratory: Negative.  Negative for apnea, cough, choking, chest tightness, shortness of breath, wheezing and stridor.   Cardiovascular: Negative.  Negative for chest pain, palpitations, orthopnea, leg swelling and PND.  Gastrointestinal: Positive for constipation. Negative for nausea, vomiting, abdominal pain, diarrhea, blood in stool, abdominal distention, anal bleeding and rectal pain.  Endocrine: Negative.   Genitourinary: Negative.   Musculoskeletal: Positive for arthralgias and back pain. Negative for gait problem, joint swelling, myalgias, neck pain and neck stiffness.  Skin: Negative.   Allergic/Immunologic: Negative.   Neurological: Negative.  Negative for dizziness, tremors, weakness, light-headedness, numbness and headaches.  Hematological: Negative.  Negative for adenopathy. Does not bruise/bleed easily.  Psychiatric/Behavioral: Negative.        Objective:   Physical Exam  Vitals reviewed. Constitutional: She is oriented to person, place, and time. She appears well-developed and well-nourished. No distress.  HENT:  Head: Normocephalic and atraumatic.  Mouth/Throat: Oropharynx  is clear and moist. No oropharyngeal exudate.  Eyes: Conjunctivae are normal. Right eye exhibits no discharge. Left eye exhibits no discharge. No scleral icterus.  Neck: Normal range of motion. Neck supple. No JVD present. No tracheal deviation present. No thyromegaly present.  Cardiovascular: Normal rate, regular rhythm, normal heart sounds and intact distal pulses.  Exam reveals no gallop and no friction rub.   No murmur heard. Pulmonary/Chest: Effort normal and breath sounds normal. No stridor. No respiratory distress. She has no wheezes. She has no rales. She exhibits no tenderness.  Abdominal: Soft. Bowel sounds are normal. She exhibits no distension and no mass. There is no tenderness. There is no rebound and no guarding.  Musculoskeletal: Normal range of motion. She exhibits no edema and no tenderness.  Lymphadenopathy:    She has no cervical adenopathy.  Neurological: She is oriented to person, place, and time.  Skin: Skin is warm and dry. No rash noted. She is not diaphoretic. No erythema. No pallor.     Lab Results  Component Value Date   WBC 5.0 09/08/2013   HGB 14.3 09/08/2013   HCT 43.0 09/08/2013   PLT 172 09/08/2013   GLUCOSE 73 09/08/2013   CHOL 200 07/09/2013   TRIG 49.0 07/09/2013   HDL 75.30 07/09/2013   LDLDIRECT 158.9 09/05/2012   LDLCALC 115* 07/09/2013   ALT 12 07/09/2013   AST 20 07/09/2013   NA 139 09/08/2013   K 5.6* 09/08/2013   CL 102 09/08/2013   CREATININE 0.83 09/08/2013   BUN 15 09/08/2013   CO2 21 09/08/2013   TSH 3.89 07/09/2013   HGBA1C 5.6 08/10/2011       Assessment & Plan:

## 2013-09-12 ENCOUNTER — Telehealth: Payer: Self-pay | Admitting: Internal Medicine

## 2013-09-12 NOTE — Telephone Encounter (Signed)
Relevant patient education assigned to patient using Emmi. ° °

## 2013-09-13 NOTE — ED Provider Notes (Signed)
Medical screening examination/treatment/procedure(s) were conducted as a shared visit with non-physician practitioner(s) and myself.  I personally evaluated the patient during the encounter.   EKG Interpretation   Date/Time:  Saturday September 08 2013 14:38:07 EDT Ventricular Rate:  64 PR Interval:  174 QRS Duration: 78 QT Interval:  466 QTC Calculation: 480 R Axis:   16 Text Interpretation:  Normal sinus rhythm Normal ECG Confirmed by Jhovani Griswold   MD, Danie Diehl [phone]) on 09/08/2013 3:47:53 PM      Patient seen and evaluated for shortness of breath. Suspect sllight volume overload due to medication (demedex) non-compliance. Patient to take meds regularly, follow up PCP.  Orpah Greek, MD 09/13/13 [phone]

## 2013-10-08 ENCOUNTER — Encounter: Payer: Self-pay | Admitting: Internal Medicine

## 2013-10-08 ENCOUNTER — Ambulatory Visit (INDEPENDENT_AMBULATORY_CARE_PROVIDER_SITE_OTHER): Payer: Medicare Other | Admitting: Internal Medicine

## 2013-10-08 VITALS — BP 120/62 | HR 64 | Temp 97.0°F | Resp 16 | Ht 70.0 in | Wt 246.0 lb

## 2013-10-08 DIAGNOSIS — I10 Essential (primary) hypertension: Secondary | ICD-10-CM

## 2013-10-08 DIAGNOSIS — G629 Polyneuropathy, unspecified: Secondary | ICD-10-CM

## 2013-10-08 DIAGNOSIS — K5909 Other constipation: Secondary | ICD-10-CM

## 2013-10-08 DIAGNOSIS — M961 Postlaminectomy syndrome, not elsewhere classified: Secondary | ICD-10-CM

## 2013-10-08 DIAGNOSIS — M179 Osteoarthritis of knee, unspecified: Secondary | ICD-10-CM

## 2013-10-08 DIAGNOSIS — Z23 Encounter for immunization: Secondary | ICD-10-CM

## 2013-10-08 DIAGNOSIS — M545 Low back pain, unspecified: Secondary | ICD-10-CM

## 2013-10-08 DIAGNOSIS — M1710 Unilateral primary osteoarthritis, unspecified knee: Secondary | ICD-10-CM

## 2013-10-08 DIAGNOSIS — M47817 Spondylosis without myelopathy or radiculopathy, lumbosacral region: Secondary | ICD-10-CM

## 2013-10-08 DIAGNOSIS — E7800 Pure hypercholesterolemia, unspecified: Secondary | ICD-10-CM

## 2013-10-08 MED ORDER — LINACLOTIDE 145 MCG PO CAPS: 145.0000 ug | ORAL_CAPSULE | Freq: Every day | ORAL | Status: DC

## 2013-10-08 MED ORDER — HYDROCODONE-ACETAMINOPHEN 5-325 MG PO TABS: 1.0000 | ORAL_TABLET | Freq: Four times a day (QID) | ORAL | Status: DC | PRN

## 2013-10-08 MED ORDER — AZILSARTAN-CHLORTHALIDONE 40-25 MG PO TABS: 1.0000 | ORAL_TABLET | Freq: Every day | ORAL | Status: DC

## 2013-10-08 NOTE — Assessment & Plan Note (Signed)
Her BP is well controlled 

## 2013-10-08 NOTE — Assessment & Plan Note (Signed)
She is doing well on livalo

## 2013-10-08 NOTE — Assessment & Plan Note (Signed)
She will cont norco as needed for pain 

## 2013-10-08 NOTE — Progress Notes (Signed)
   Subjective:    Patient ID: Claudia [name], female    DOB: 04/25/1934, 79 y.o.   MRN: [id number]  Hypertension This is a chronic problem. The current episode started more than 1 year ago. The problem has been gradually improving since onset. The problem is controlled. Pertinent negatives include no anxiety, blurred vision, chest pain, headaches, malaise/fatigue, neck pain, orthopnea, palpitations, peripheral edema, PND, shortness of breath or sweats. There are no associated agents to hypertension. Past treatments include angiotensin blockers and diuretics. The current treatment provides significant improvement. Compliance problems include exercise and diet.  Hypertensive end-organ damage includes kidney disease. Identifiable causes of hypertension include chronic renal disease.      Review of Systems  Constitutional: Negative.  Negative for fever, chills, malaise/fatigue, diaphoresis, appetite change and fatigue.  HENT: Negative.   Eyes: Negative.  Negative for blurred vision.  Respiratory: Negative.  Negative for cough, choking, chest tightness, shortness of breath and stridor.   Cardiovascular: Negative.  Negative for chest pain, palpitations, orthopnea, leg swelling and PND.  Gastrointestinal: Negative.  Negative for nausea, vomiting, abdominal pain, diarrhea, constipation and blood in stool.  Endocrine: Negative.   Genitourinary: Negative.   Musculoskeletal: Positive for arthralgias and back pain. Negative for gait problem, joint swelling, myalgias, neck pain and neck stiffness.  Allergic/Immunologic: Negative.   Neurological: Negative.  Negative for headaches.  Hematological: Negative.  Negative for adenopathy. Does not bruise/bleed easily.  Psychiatric/Behavioral: Negative.        Objective:   Physical Exam  Vitals reviewed. Constitutional: She is oriented to person, place, and time. She appears well-developed and well-nourished. No distress.  HENT:  Head: Normocephalic and  atraumatic.  Mouth/Throat: Oropharynx is clear and moist. No oropharyngeal exudate.  Eyes: Conjunctivae are normal. Right eye exhibits no discharge. Left eye exhibits no discharge. No scleral icterus.  Neck: Normal range of motion. Neck supple. No JVD present. No tracheal deviation present. No thyromegaly present.  Cardiovascular: Normal rate, regular rhythm, normal heart sounds and intact distal pulses.  Exam reveals no gallop and no friction rub.   No murmur heard. Pulmonary/Chest: Effort normal and breath sounds normal. No stridor. No respiratory distress. She has no wheezes. She has no rales. She exhibits no tenderness.  Abdominal: Soft. Bowel sounds are normal. She exhibits no distension and no mass. There is no tenderness. There is no rebound and no guarding.  Musculoskeletal: Normal range of motion. She exhibits no edema and no tenderness.  Lymphadenopathy:    She has no cervical adenopathy.  Neurological: She is oriented to person, place, and time.  Skin: Skin is warm and dry. No rash noted. She is not diaphoretic. No erythema. No pallor.  Psychiatric: She has a normal mood and affect. Her behavior is normal. Judgment and thought content normal.     Lab Results  Component Value Date   WBC 5.0 09/08/2013   HGB 14.3 09/08/2013   HCT 43.0 09/08/2013   PLT 172 09/08/2013   GLUCOSE 91 09/11/2013   CHOL 200 07/09/2013   TRIG 49.0 07/09/2013   HDL 75.30 07/09/2013   LDLDIRECT 158.9 09/05/2012   LDLCALC 115* 07/09/2013   ALT 15 09/11/2013   AST 18 09/11/2013   NA 140 09/11/2013   K 4.7 09/11/2013   CL 104 09/11/2013   CREATININE 1.4* 09/11/2013   BUN 28* 09/11/2013   CO2 27 09/11/2013   TSH 2.93 09/11/2013   HGBA1C 5.6 08/10/2011       Assessment & Plan:

## 2013-10-08 NOTE — Patient Instructions (Signed)

## 2013-10-15 ENCOUNTER — Telehealth: Payer: Self-pay

## 2013-10-15 NOTE — Telephone Encounter (Signed)
Gae Bon with [employer] called [employer]mov[employer] stating that PA was received but additional questions are needed. I returned call spoke with NIna who advised that she will refax fform to correct fax number. Forms received, completed and faxed back to [phone]

## 2013-11-14 ENCOUNTER — Telehealth: Payer: Self-pay | Admitting: *Deleted

## 2013-11-14 DIAGNOSIS — I10 Essential (primary) hypertension: Secondary | ICD-10-CM

## 2013-11-14 MED ORDER — IRBESARTAN-HYDROCHLOROTHIAZIDE 300-25 MG PO TABS
1.0000 | ORAL_TABLET | Freq: Every day | ORAL | Status: DC
Start: 2013-11-14 — End: 2013-12-17

## 2013-11-14 NOTE — Telephone Encounter (Signed)
Pt left msg on triage stating md gave her new BP medication, and with her prime mail it will be $300 for 90 day. They gave her an alternative that is cover under her plan Irbesartan if that ok with md pls send prescription into [facility]...Johny Chess

## 2013-12-07 ENCOUNTER — Emergency Department (HOSPITAL_COMMUNITY)
Admission: EM | Admit: 2013-12-07 | Discharge: 2013-12-07 | Disposition: A | Discharge status: Home / Self Care | Payer: Medicare Other | Attending: Emergency Medicine | Admitting: Emergency Medicine

## 2013-12-07 ENCOUNTER — Encounter (HOSPITAL_COMMUNITY): Payer: Self-pay | Admitting: Emergency Medicine

## 2013-12-07 ENCOUNTER — Emergency Department (HOSPITAL_COMMUNITY): Payer: Medicare Other

## 2013-12-07 DIAGNOSIS — W1809XA Striking against other object with subsequent fall, initial encounter: Secondary | ICD-10-CM | POA: Insufficient documentation

## 2013-12-07 DIAGNOSIS — Z7982 Long term (current) use of aspirin: Secondary | ICD-10-CM | POA: Insufficient documentation

## 2013-12-07 DIAGNOSIS — E7800 Pure hypercholesterolemia, unspecified: Secondary | ICD-10-CM | POA: Insufficient documentation

## 2013-12-07 DIAGNOSIS — S0003XA Contusion of scalp, initial encounter: Secondary | ICD-10-CM | POA: Insufficient documentation

## 2013-12-07 DIAGNOSIS — E669 Obesity, unspecified: Secondary | ICD-10-CM | POA: Insufficient documentation

## 2013-12-07 DIAGNOSIS — I10 Essential (primary) hypertension: Secondary | ICD-10-CM | POA: Insufficient documentation

## 2013-12-07 DIAGNOSIS — Y92838 Other recreation area as the place of occurrence of the external cause: Secondary | ICD-10-CM

## 2013-12-07 DIAGNOSIS — S1093XA Contusion of unspecified part of neck, initial encounter: Secondary | ICD-10-CM

## 2013-12-07 DIAGNOSIS — S060X0A Concussion without loss of consciousness, initial encounter: Secondary | ICD-10-CM | POA: Insufficient documentation

## 2013-12-07 DIAGNOSIS — S060X9A Concussion with loss of consciousness of unspecified duration, initial encounter: Secondary | ICD-10-CM

## 2013-12-07 DIAGNOSIS — M1990 Unspecified osteoarthritis, unspecified site: Secondary | ICD-10-CM | POA: Insufficient documentation

## 2013-12-07 DIAGNOSIS — S0083XA Contusion of other part of head, initial encounter: Secondary | ICD-10-CM | POA: Insufficient documentation

## 2013-12-07 DIAGNOSIS — Y9389 Activity, other specified: Secondary | ICD-10-CM | POA: Insufficient documentation

## 2013-12-07 DIAGNOSIS — Y9239 Other specified sports and athletic area as the place of occurrence of the external cause: Secondary | ICD-10-CM | POA: Insufficient documentation

## 2013-12-07 DIAGNOSIS — S060XAA Concussion with loss of consciousness status unknown, initial encounter: Secondary | ICD-10-CM

## 2013-12-07 DIAGNOSIS — Z79899 Other long term (current) drug therapy: Secondary | ICD-10-CM | POA: Insufficient documentation

## 2013-12-07 NOTE — Discharge Instructions (Signed)

## 2013-12-07 NOTE — ED Notes (Signed)
Spoke with CT and stated pt 2nd in line to go to CT.  Pt notified.

## 2013-12-07 NOTE — ED Notes (Signed)
Pt fell on Tuesday when her sneaker grabbed to the floor.  She fell forward and hit her head and had a large hematoma.  No LOC.  Pt has bruising to forehead.  Pt went to gym on Wednesday and started feeling dizzy and noted bruising under left eye.  No blood thinners, no chest pain.  No dizziness with sitting

## 2013-12-07 NOTE — ED Provider Notes (Signed)
CSN: [id number]     Arrival date & time 12/07/13  1146 History   First MD Initiated Contact with Patient 12/07/13 1209     Chief Complaint  Patient presents with  . Fall     (Consider location/radiation/quality/duration/timing/severity/associated sxs/prior Treatment) Patient is a 80 y.o. female presenting with fall. The history is provided by the patient.  Fall This is a new (at the nursing home with family and shoe got caught and threw her forward adn she hit her head on the floor) problem. Episode onset: 3 days ago. The problem occurs daily. The problem has been gradually worsening. Associated symptoms comments: Over the last few days worsening lightheadedness with standing.  Resolves if sits down.  No weakness, vision changes, fever, neck pain or speech/swallowing issues.. The symptoms are aggravated by standing. The symptoms are relieved by rest. She has tried rest for the symptoms. The treatment provided no relief.    Past Medical History  Diagnosis Date  . Osteoarthritis   . Pure hypercholesterolemia   . Essential hypertension, benign   . Gouty arthropathy   . Lumbago   . Degeneration of lumbar or lumbosacral intervertebral disc   . Brachial neuritis or radiculitis NOS   . Unspecified hypothyroidism   . Obesity   . Abnormality of gait    Past Surgical History  Procedure Laterality Date  . Right knee replacement  03/13/2010  . Lumbar laminectomy  03/13/2010  . Abdominal hysterectomy  03/13/2010  . Back surgery     Family History  Problem Relation Age of Onset  . Heart disease Father   . Hypertension Mother   . Alzheimer's disease Mother   . Cancer Neg Hx   . Kidney disease Neg Hx   . Diabetes Sister   . Cirrhosis Brother    History  Substance Use Topics  . Smoking status: Never Smoker   . Smokeless tobacco: Never Used  . Alcohol Use: No     Comment: social   OB History   Grav Para Term Preterm Abortions TAB SAB Ect Mult Living                 Review of  Systems  Gastrointestinal: Negative for nausea and vomiting.  Neurological: Positive for light-headedness. Negative for weakness.  All other systems reviewed and are negative.     Allergies  Lipitor and Trileptal  Home Medications   Prior to Admission medications   Medication Sig Start Date End Date Taking? Authorizing Provider  aspirin 81 MG tablet Take 81 mg by mouth daily.    Historical Provider, MD  cetirizine (ZYRTEC) 10 MG tablet Take 10 mg by mouth daily.    Historical Provider, MD  HYDROcodone-acetaminophen (NORCO/VICODIN) 5-325 MG per tablet Take 1 tablet by mouth every 6 (six) hours as needed. Fill on on or after 12/08/13 10/08/13   Janith Lima, MD  irbesartan-hydrochlorothiazide (AVALIDE) 300-25 MG per tablet Take 1 tablet by mouth daily. 11/14/13   Janith Lima, MD  Linaclotide [facility]) 145 MCG CAPS capsule Take 1 capsule (145 mcg total) by mouth daily. 10/08/13   Janith Lima, MD  Multiple Vitamins-Minerals (ULTRA WOMENS PACK) MISC Take 1 tablet by mouth daily.    Historical Provider, MD  Pitavastatin Calcium (LIVALO) 2 MG TABS Take 1 tablet (2 mg total) by mouth daily. 07/09/13   Janith Lima, MD   BP 117/61  Pulse 68  Temp(Src) 97.8 F (36.6 C) (Oral)  Resp 20  SpO2 100% Physical Exam  Nursing note and vitals reviewed. Constitutional: She is oriented to person, place, and time. She appears well-developed and well-nourished. No distress.  HENT:  Head: Normocephalic. Head is with contusion.    Right Ear: Tympanic membrane and ear canal normal.  Left Ear: Tympanic membrane and ear canal normal.  Mouth/Throat: Oropharynx is clear and moist.  Healing hematoma with ecchymosis over the left forehead and left eye  Eyes: Conjunctivae and EOM are normal. Pupils are equal, round, and reactive to light.  Neck: Normal range of motion. Neck supple. No spinous process tenderness and no muscular tenderness present.  Cardiovascular: Normal rate, regular rhythm and intact  distal pulses.   No murmur heard. Pulmonary/Chest: Effort normal and breath sounds normal. No respiratory distress. She has no wheezes. She has no rales.  Abdominal: Soft. She exhibits no distension. There is no tenderness. There is no rebound and no guarding.  Musculoskeletal: Normal range of motion. She exhibits no edema and no tenderness.  Neurological: She is alert and oriented to person, place, and time.  Skin: Skin is warm and dry. No rash noted. No erythema.  Psychiatric: She has a normal mood and affect. Her behavior is normal.    ED Course  Procedures (including critical care time) Labs Review Labs Reviewed - No data to display  Imaging Review Ct Head Wo Contrast  12/07/2013   CLINICAL DATA:  Fall.  EXAM: CT HEAD WITHOUT CONTRAST  TECHNIQUE: Contiguous axial images were obtained from the base of the skull through the vertex without intravenous contrast.  COMPARISON:  None.  FINDINGS: No mass. No hydrocephalus. No hemorrhage. No acute bony abnormality. Small amount of air is noted in the CSF spaces along the lateral aspect of the right cerebellar hemisphere, this may be related to recent myelogram.  IMPRESSION: No acute intracranial abnormality.  No acute bony abnormality.   Electronically Signed   By: Marcello Moores  Register   On: 12/07/2013 14:45     EKG Interpretation None      MDM   Final diagnoses:  None   Patient with a mechanical fall 3 days ago hitting her head on the telemetry floor with a large hematoma. Initially she was fine however in the last 2 days she's had progressive lightheadedness upon standing.  She denies headache, focal weakness, vision change.  Neck pain is neurovascularly intact and otherwise normal exam except for healing hematoma over the left forehead.  CT of the head pending for further evaluation. Feel most likely postconcussive syndrome  2:55 PM Ct neg for acute pathology.  Pt d/ced home with concussion.  Blanchie Dessert, MD 12/07/13 1456

## 2013-12-12 ENCOUNTER — Telehealth: Payer: Self-pay

## 2013-12-12 DIAGNOSIS — I10 Essential (primary) hypertension: Secondary | ICD-10-CM

## 2013-12-12 NOTE — Telephone Encounter (Signed)
This is for BP control I can change the dose if I need to

## 2013-12-12 NOTE — Telephone Encounter (Signed)
Pt would like to know why she has been prescribed to take ibesartan- hctz 150-12.5 mg 2 tabs po daily instead of 1 tab.  Pt's insurance will not cover medication due to this as well. Please advise

## 2013-12-13 NOTE — Telephone Encounter (Signed)
Attempted to notify patient but line was busy

## 2013-12-17 ENCOUNTER — Telehealth: Payer: Self-pay | Admitting: *Deleted

## 2013-12-17 ENCOUNTER — Telehealth: Payer: Self-pay

## 2013-12-17 ENCOUNTER — Other Ambulatory Visit: Payer: Self-pay | Admitting: Internal Medicine

## 2013-12-17 DIAGNOSIS — I10 Essential (primary) hypertension: Secondary | ICD-10-CM

## 2013-12-17 MED ORDER — IRBESARTAN-HYDROCHLOROTHIAZIDE 300-25 MG PO TABS: 1.0000 | ORAL_TABLET | Freq: Every day | ORAL | Status: DC

## 2013-12-17 MED ORDER — IRBESARTAN-HYDROCHLOROTHIAZIDE 300-12.5 MG PO TABS: 1.0000 | ORAL_TABLET | Freq: Every day | ORAL | Status: DC

## 2013-12-17 NOTE — Telephone Encounter (Signed)
Patient called back and states [facility] advised her Irbesartan HCTZ 300-25 is not a dose that is made. Her insurance will not cover her taking Irbesartan HCTZ 150/12.5 mg BID Please advise. Her blood pressure today is 152/82.

## 2013-12-17 NOTE — Telephone Encounter (Signed)
Patient has been advised

## 2013-12-17 NOTE — Telephone Encounter (Signed)
I called and spoke to patient and advised her, the original dose she prescribed was Ibesartan-hctz 300/25 mg. So I will submit this to [facility] (per patient request) and send a 30 day supply to [place] on [address] and [address]. I explained to her it is the pharmacies doing as to why she was given the dose of 150/12.5 mg BID

## 2013-12-17 NOTE — Telephone Encounter (Signed)
Please tell her that the 300/25 dose is no longer made and that we have called in the 300/12.5 dose. Should be nearly the same but she should moniter her BP for the next three weeks and report back if higher than 140/90 consistently   Measure BP once a day at random times  Watch salt intake as well!!!

## 2013-12-17 NOTE — Telephone Encounter (Signed)
Pt call back to check on this request. Please call pt  Back (want to talk to the assistant).

## 2013-12-17 NOTE — Telephone Encounter (Signed)
Left msg on triage stating we have called several times, also fax over md quantity limited PA on 12/12/13. Member ID # [id number]...Claudia [name]

## 2013-12-19 ENCOUNTER — Telehealth: Payer: Self-pay | Admitting: *Deleted

## 2013-12-19 NOTE — Telephone Encounter (Signed)
Left msg on triage md sent Avalide 300/25 that mg has been d/c by the manufacturer. Will need alternate mg. When calling back use ref # [id number]...Claudia [name]

## 2013-12-20 NOTE — Telephone Encounter (Signed)
Noted closing encounter...lmb

## 2013-12-20 NOTE — Telephone Encounter (Signed)
It looks like this has already been taken care of

## 2014-02-06 ENCOUNTER — Encounter: Payer: Self-pay | Admitting: Internal Medicine

## 2014-02-06 ENCOUNTER — Other Ambulatory Visit (INDEPENDENT_AMBULATORY_CARE_PROVIDER_SITE_OTHER): Payer: Medicare Other

## 2014-02-06 ENCOUNTER — Ambulatory Visit (INDEPENDENT_AMBULATORY_CARE_PROVIDER_SITE_OTHER): Payer: Medicare Other | Admitting: Internal Medicine

## 2014-02-06 VITALS — BP 120/74 | HR 52 | Temp 97.9°F | Resp 16 | Ht 70.0 in | Wt 244.1 lb

## 2014-02-06 DIAGNOSIS — E039 Hypothyroidism, unspecified: Secondary | ICD-10-CM

## 2014-02-06 DIAGNOSIS — M47817 Spondylosis without myelopathy or radiculopathy, lumbosacral region: Secondary | ICD-10-CM

## 2014-02-06 DIAGNOSIS — M545 Low back pain, unspecified: Secondary | ICD-10-CM

## 2014-02-06 DIAGNOSIS — E7800 Pure hypercholesterolemia, unspecified: Secondary | ICD-10-CM

## 2014-02-06 DIAGNOSIS — M170 Bilateral primary osteoarthritis of knee: Secondary | ICD-10-CM

## 2014-02-06 DIAGNOSIS — M1710 Unilateral primary osteoarthritis, unspecified knee: Secondary | ICD-10-CM

## 2014-02-06 DIAGNOSIS — I10 Essential (primary) hypertension: Secondary | ICD-10-CM

## 2014-02-06 DIAGNOSIS — M961 Postlaminectomy syndrome, not elsewhere classified: Secondary | ICD-10-CM

## 2014-02-06 LAB — BASIC METABOLIC PANEL
BUN: 18 mg/dL (ref 6–23)
CO2: 27 mEq/L (ref 19–32)
Calcium: 9.8 mg/dL (ref 8.4–10.5)
Chloride: 107 mEq/L (ref 96–112)
Creatinine, Ser: 1 mg/dL (ref 0.4–1.2)
GFR: 68.18 mL/min (ref >60.00)
GLUCOSE: 90 mg/dL (ref 70–99)
POTASSIUM: 4.3 meq/L (ref 3.5–5.1)
Sodium: 140 mEq/L (ref 135–145)

## 2014-02-06 LAB — LIPID PANEL
CHOLESTEROL: 180 mg/dL (ref 0–200)
HDL: 62.2 mg/dL (ref >39.00)
LDL Cholesterol: 106 mg/dL — ABNORMAL HIGH (ref 0–99)
NonHDL: 117.8
Total CHOL/HDL Ratio: 3
Triglycerides: 57 mg/dL (ref 0.0–149.0)
VLDL: 11.4 mg/dL (ref 0.0–40.0)

## 2014-02-06 LAB — TSH: TSH: 4.32 u[IU]/mL (ref 0.35–4.50)

## 2014-02-06 MED ORDER — IRBESARTAN-HYDROCHLOROTHIAZIDE 300-12.5 MG PO TABS: 1.0000 | ORAL_TABLET | Freq: Every day | ORAL | Status: DC

## 2014-02-06 MED ORDER — HYDROCODONE-ACETAMINOPHEN 5-325 MG PO TABS: 1.0000 | ORAL_TABLET | Freq: Four times a day (QID) | ORAL | Status: DC | PRN

## 2014-02-06 MED ORDER — HYDROCODONE-ACETAMINOPHEN 5-325 MG PO TABS
1.0000 | ORAL_TABLET | Freq: Four times a day (QID) | ORAL | Status: DC | PRN
Start: 2014-02-06 — End: 2014-05-27

## 2014-02-06 NOTE — Progress Notes (Signed)
Pre visit review using our clinic review tool, if applicable. No additional management support is needed unless otherwise documented below in the visit note. 

## 2014-02-06 NOTE — Patient Instructions (Signed)

## 2014-02-06 NOTE — Progress Notes (Signed)
   Subjective:    Patient ID: Claudia [name], female    DOB: 1934/02/11, 80 y.o.   MRN: [id number]  Hypertension This is a chronic problem. The current episode started more than 1 year ago. The problem has been gradually improving since onset. The problem is controlled. Pertinent negatives include no anxiety, blurred vision, chest pain, headaches, malaise/fatigue, neck pain, orthopnea, palpitations, peripheral edema, PND, shortness of breath or sweats. There are no associated agents to hypertension. Past treatments include angiotensin blockers and diuretics.      Review of Systems  Constitutional: Negative.  Negative for fever, chills, malaise/fatigue, diaphoresis, appetite change and fatigue.  HENT: Negative.   Eyes: Negative.  Negative for blurred vision.  Respiratory: Negative.  Negative for cough, choking, chest tightness, shortness of breath and stridor.   Cardiovascular: Negative.  Negative for chest pain, palpitations, orthopnea, leg swelling and PND.  Gastrointestinal: Negative.  Negative for nausea, vomiting, abdominal pain, diarrhea, constipation and blood in stool.  Endocrine: Negative.   Genitourinary: Negative.   Musculoskeletal: Positive for arthralgias and back pain. Negative for gait problem, myalgias, neck pain and neck stiffness.  Skin: Negative.  Negative for rash.  Allergic/Immunologic: Negative.   Neurological: Negative.  Negative for dizziness, tremors, seizures, syncope, light-headedness and headaches.  Hematological: Negative.  Negative for adenopathy. Does not bruise/bleed easily.  Psychiatric/Behavioral: Negative.        Objective:   Physical Exam  Vitals reviewed. Constitutional: She is oriented to person, place, and time. She appears well-developed and well-nourished. No distress.  HENT:  Head: Normocephalic and atraumatic.  Mouth/Throat: Oropharynx is clear and moist. No oropharyngeal exudate.  Eyes: Conjunctivae are normal. Right eye exhibits no  discharge. Left eye exhibits no discharge. No scleral icterus.  Neck: Normal range of motion. Neck supple. No JVD present. No tracheal deviation present. No thyromegaly present.  Cardiovascular: Normal rate, regular rhythm, normal heart sounds and intact distal pulses.  Exam reveals no gallop and no friction rub.   No murmur heard. Pulmonary/Chest: Effort normal and breath sounds normal. No stridor. No respiratory distress. She has no wheezes. She has no rales. She exhibits no tenderness.  Abdominal: Soft. Bowel sounds are normal. She exhibits no distension and no mass. There is no tenderness. There is no rebound and no guarding.  Musculoskeletal: Normal range of motion. She exhibits no edema and no tenderness.  Lymphadenopathy:    She has no cervical adenopathy.  Neurological: She is oriented to person, place, and time.  Skin: Skin is warm and dry. No rash noted. She is not diaphoretic. No erythema. No pallor.  Psychiatric: She has a normal mood and affect. Her behavior is normal. Judgment and thought content normal.     Lab Results  Component Value Date   WBC 5.0 09/08/2013   HGB 14.3 09/08/2013   HCT 43.0 09/08/2013   PLT 172 09/08/2013   GLUCOSE 91 09/11/2013   CHOL 200 07/09/2013   TRIG 49.0 07/09/2013   HDL 75.30 07/09/2013   LDLDIRECT 158.9 09/05/2012   LDLCALC 115* 07/09/2013   ALT 15 09/11/2013   AST 18 09/11/2013   NA 140 09/11/2013   K 4.7 09/11/2013   CL 104 09/11/2013   CREATININE 1.4* 09/11/2013   BUN 28* 09/11/2013   CO2 27 09/11/2013   TSH 2.93 09/11/2013   HGBA1C 5.6 08/10/2011       Assessment & Plan:

## 2014-02-07 NOTE — Assessment & Plan Note (Signed)
She will cont norco as needed for pain 

## 2014-02-07 NOTE — Assessment & Plan Note (Signed)
She has achieved her LDL goal 

## 2014-02-07 NOTE — Assessment & Plan Note (Signed)
Her TSH is on the normal range She will stay on the current dose 

## 2014-02-07 NOTE — Assessment & Plan Note (Signed)
Her BP is well controlled Her lytes and renal function are stable 

## 2014-02-13 ENCOUNTER — Other Ambulatory Visit: Payer: Self-pay

## 2014-02-13 DIAGNOSIS — Z1231 Encounter for screening mammogram for malignant neoplasm of breast: Secondary | ICD-10-CM

## 2014-02-15 ENCOUNTER — Telehealth: Payer: Self-pay | Admitting: Internal Medicine

## 2014-02-15 DIAGNOSIS — M179 Osteoarthritis of knee, unspecified: Secondary | ICD-10-CM

## 2014-02-15 NOTE — Telephone Encounter (Signed)
done

## 2014-02-15 NOTE — Telephone Encounter (Signed)
Pt called stated Dr. Ronnald Ramp was going to refer her to [facility] for her knee pain. Please check no referral in the system, pt waited for two week.

## 2014-02-21 ENCOUNTER — Ambulatory Visit
Admission: RE | Admit: 2014-02-21 | Discharge: 2014-02-21 | Disposition: A | Discharge status: Home / Self Care | Payer: Medicare Other | Source: Ambulatory Visit

## 2014-02-21 DIAGNOSIS — Z1231 Encounter for screening mammogram for malignant neoplasm of breast: Secondary | ICD-10-CM

## 2014-02-21 LAB — HM MAMMOGRAPHY: HM Mammogram: NORMAL

## 2014-04-04 ENCOUNTER — Other Ambulatory Visit (HOSPITAL_COMMUNITY): Payer: Self-pay | Admitting: Orthopedic Surgery

## 2014-04-04 DIAGNOSIS — M2342 Loose body in knee, left knee: Secondary | ICD-10-CM

## 2014-04-11 ENCOUNTER — Ambulatory Visit (INDEPENDENT_AMBULATORY_CARE_PROVIDER_SITE_OTHER): Payer: Medicare Other

## 2014-04-11 DIAGNOSIS — Z23 Encounter for immunization: Secondary | ICD-10-CM

## 2014-04-12 ENCOUNTER — Encounter (HOSPITAL_COMMUNITY)
Admission: RE | Admit: 2014-04-12 | Discharge: 2014-04-12 | Disposition: A | Discharge status: Home / Self Care | Payer: Medicare Other | Source: Ambulatory Visit | Attending: Orthopedic Surgery | Admitting: Orthopedic Surgery

## 2014-04-12 DIAGNOSIS — M2342 Loose body in knee, left knee: Secondary | ICD-10-CM | POA: Diagnosis not present

## 2014-04-12 MED ORDER — TECHNETIUM TC 99M MEDRONATE IV KIT
25.0000 | PACK | Freq: Once | INTRAVENOUS | Status: AC | PRN
  Administered 2014-04-12: 25 via INTRAVENOUS

## 2014-05-27 ENCOUNTER — Other Ambulatory Visit: Payer: Self-pay | Admitting: Internal Medicine

## 2014-05-27 DIAGNOSIS — M47817 Spondylosis without myelopathy or radiculopathy, lumbosacral region: Secondary | ICD-10-CM

## 2014-05-27 DIAGNOSIS — M545 Low back pain, unspecified: Secondary | ICD-10-CM

## 2014-05-27 DIAGNOSIS — M961 Postlaminectomy syndrome, not elsewhere classified: Secondary | ICD-10-CM

## 2014-05-27 DIAGNOSIS — M170 Bilateral primary osteoarthritis of knee: Secondary | ICD-10-CM

## 2014-05-27 MED ORDER — HYDROCODONE-ACETAMINOPHEN 5-325 MG PO TABS
1.0000 | ORAL_TABLET | Freq: Four times a day (QID) | ORAL | Status: DC | PRN
Start: 2014-05-27 — End: 2014-06-12

## 2014-05-27 NOTE — Telephone Encounter (Signed)
Please advise refill? 

## 2014-05-27 NOTE — Telephone Encounter (Signed)
Pt called in requesting refill on hydrocodone.      Best number [phone] Cell [phone]

## 2014-05-28 NOTE — Telephone Encounter (Signed)
Patient notified rx is ready for pick up

## 2014-06-10 LAB — HM DEXA SCAN: HM DEXA SCAN: -1.7

## 2014-06-12 ENCOUNTER — Ambulatory Visit (INDEPENDENT_AMBULATORY_CARE_PROVIDER_SITE_OTHER): Payer: Medicare Other | Admitting: Internal Medicine

## 2014-06-12 ENCOUNTER — Encounter: Payer: Self-pay | Admitting: Internal Medicine

## 2014-06-12 ENCOUNTER — Other Ambulatory Visit (INDEPENDENT_AMBULATORY_CARE_PROVIDER_SITE_OTHER): Payer: Medicare Other

## 2014-06-12 VITALS — BP 130/78 | HR 68 | Temp 97.4°F | Resp 16 | Ht 70.0 in | Wt 244.0 lb

## 2014-06-12 DIAGNOSIS — Z23 Encounter for immunization: Secondary | ICD-10-CM

## 2014-06-12 DIAGNOSIS — M5440 Lumbago with sciatica, unspecified side: Secondary | ICD-10-CM

## 2014-06-12 DIAGNOSIS — M545 Low back pain, unspecified: Secondary | ICD-10-CM

## 2014-06-12 DIAGNOSIS — M47817 Spondylosis without myelopathy or radiculopathy, lumbosacral region: Secondary | ICD-10-CM

## 2014-06-12 DIAGNOSIS — I10 Essential (primary) hypertension: Secondary | ICD-10-CM

## 2014-06-12 DIAGNOSIS — M170 Bilateral primary osteoarthritis of knee: Secondary | ICD-10-CM

## 2014-06-12 DIAGNOSIS — M961 Postlaminectomy syndrome, not elsewhere classified: Secondary | ICD-10-CM

## 2014-06-12 LAB — BASIC METABOLIC PANEL
BUN: 22 mg/dL (ref 6–23)
CHLORIDE: 108 meq/L (ref 96–112)
CO2: 25 meq/L (ref 19–32)
Calcium: 9.5 mg/dL (ref 8.4–10.5)
Creatinine, Ser: 0.9 mg/dL (ref 0.4–1.2)
GFR: 83.12 mL/min (ref >60.00)
GLUCOSE: 94 mg/dL (ref 70–99)
POTASSIUM: 4.5 meq/L (ref 3.5–5.1)
SODIUM: 138 meq/L (ref 135–145)

## 2014-06-12 MED ORDER — HYDROCODONE-ACETAMINOPHEN 5-325 MG PO TABS: 1.0000 | ORAL_TABLET | Freq: Four times a day (QID) | ORAL | Status: DC | PRN

## 2014-06-12 MED ORDER — PNEUMOCOCCAL VAC POLYVALENT 25 MCG/0.5ML IJ INJ: 0.5000 mL | INJECTION | INTRAMUSCULAR | Status: AC

## 2014-06-12 NOTE — Progress Notes (Signed)
   Subjective:    Patient ID: Claudia [name], female    DOB: 1933/08/05, 80 y.o.   MRN: [id number]  Hypertension This is a chronic problem. The current episode started more than 1 year ago. The problem is unchanged. The problem is controlled. Pertinent negatives include no anxiety, blurred vision, chest pain, headaches, malaise/fatigue, neck pain, orthopnea, palpitations, peripheral edema, PND, shortness of breath or sweats. Past treatments include angiotensin blockers and diuretics. The current treatment provides moderate improvement. There are no compliance problems.       Review of Systems  Constitutional: Negative.  Negative for fever, chills, malaise/fatigue, diaphoresis, appetite change and fatigue.  HENT: Negative.   Eyes: Negative.  Negative for blurred vision.  Respiratory: Negative.  Negative for cough, choking, chest tightness and shortness of breath.   Cardiovascular: Negative.  Negative for chest pain, palpitations, orthopnea and PND.  Gastrointestinal: Negative.  Negative for nausea, vomiting, abdominal pain, diarrhea, constipation and blood in stool.  Endocrine: Negative.   Genitourinary: Negative.   Musculoskeletal: Positive for back pain and arthralgias. Negative for myalgias, joint swelling, gait problem and neck pain.  Skin: Negative.  Negative for rash.  Allergic/Immunologic: Negative.   Neurological: Negative.  Negative for dizziness, tremors, syncope, light-headedness, numbness and headaches.  Hematological: Negative.  Negative for adenopathy. Does not bruise/bleed easily.  Psychiatric/Behavioral: Negative.        Objective:   Physical Exam  Constitutional: She is oriented to person, place, and time. She appears well-developed and well-nourished. No distress.  HENT:  Head: Normocephalic and atraumatic.  Mouth/Throat: Oropharynx is clear and moist. No oropharyngeal exudate.  Eyes: Conjunctivae are normal. Right eye exhibits no discharge. Left eye exhibits no  discharge. No scleral icterus.  Neck: Normal range of motion. Neck supple. No JVD present. No tracheal deviation present. No thyromegaly present.  Cardiovascular: Normal rate, regular rhythm, normal heart sounds and intact distal pulses.  Exam reveals no gallop and no friction rub.   No murmur heard. Pulmonary/Chest: Effort normal and breath sounds normal. No stridor. No respiratory distress. She has no wheezes. She has no rales. She exhibits no tenderness.  Abdominal: Soft. Bowel sounds are normal. She exhibits no distension and no mass. There is no tenderness. There is no rebound and no guarding.  Musculoskeletal: Normal range of motion. She exhibits no edema or tenderness.  Lymphadenopathy:    She has no cervical adenopathy.  Neurological: She is oriented to person, place, and time.  Skin: Skin is warm and dry. No rash noted. She is not diaphoretic. No erythema. No pallor.  Psychiatric: She has a normal mood and affect. Her behavior is normal. Judgment and thought content normal.  Vitals reviewed.     Lab Results  Component Value Date   WBC 5.0 09/08/2013   HGB 14.3 09/08/2013   HCT 43.0 09/08/2013   PLT 172 09/08/2013   GLUCOSE 90 02/06/2014   CHOL 180 02/06/2014   TRIG 57.0 02/06/2014   HDL 62.20 02/06/2014   LDLDIRECT 158.9 09/05/2012   LDLCALC 106* 02/06/2014   ALT 15 09/11/2013   AST 18 09/11/2013   NA 140 02/06/2014   K 4.3 02/06/2014   CL 107 02/06/2014   CREATININE 1.0 02/06/2014   BUN 18 02/06/2014   CO2 27 02/06/2014   TSH 4.32 02/06/2014   HGBA1C 5.6 08/10/2011      Assessment & Plan:

## 2014-06-12 NOTE — Patient Instructions (Signed)

## 2014-06-13 NOTE — Assessment & Plan Note (Signed)
Her BP is well controlled Lytes and renal function are stable 

## 2014-06-13 NOTE — Assessment & Plan Note (Signed)
She will cont norco as needed for pain 

## 2014-06-24 ENCOUNTER — Encounter: Payer: Self-pay | Admitting: Internal Medicine

## 2014-06-24 ENCOUNTER — Ambulatory Visit (INDEPENDENT_AMBULATORY_CARE_PROVIDER_SITE_OTHER)
Admission: RE | Admit: 2014-06-24 | Discharge: 2014-06-24 | Disposition: A | Discharge status: Home / Self Care | Payer: Medicare Other | Source: Ambulatory Visit | Attending: Internal Medicine | Admitting: Internal Medicine

## 2014-06-24 ENCOUNTER — Ambulatory Visit (INDEPENDENT_AMBULATORY_CARE_PROVIDER_SITE_OTHER): Payer: Medicare Other | Admitting: Internal Medicine

## 2014-06-24 VITALS — BP 138/74 | HR 70 | Temp 97.4°F | Resp 16 | Ht 70.0 in | Wt 247.0 lb

## 2014-06-24 DIAGNOSIS — R05 Cough: Secondary | ICD-10-CM

## 2014-06-24 DIAGNOSIS — J202 Acute bronchitis due to streptococcus: Secondary | ICD-10-CM | POA: Insufficient documentation

## 2014-06-24 DIAGNOSIS — R059 Cough, unspecified: Secondary | ICD-10-CM

## 2014-06-24 MED ORDER — AMOXICILLIN 875 MG PO TABS: 875.0000 mg | ORAL_TABLET | Freq: Two times a day (BID) | ORAL | Status: DC

## 2014-06-24 MED ORDER — PROMETHAZINE-DM 6.25-15 MG/5ML PO SYRP
5.0000 mL | ORAL_SOLUTION | Freq: Four times a day (QID) | ORAL | Status: DC | PRN
Start: 2014-06-24 — End: 2014-06-27

## 2014-06-24 NOTE — Assessment & Plan Note (Signed)
Her CXR is normal.

## 2014-06-24 NOTE — Progress Notes (Signed)
Subjective:    Patient ID: Claudia [name], female    DOB: February 09, 1934, 80 y.o.   MRN: [id number]  Cough This is a new problem. The current episode started in the past 7 days. The problem has been gradually worsening. The problem occurs every few hours. The cough is productive of purulent sputum. Associated symptoms include chills and a sore throat. Pertinent negatives include no chest pain, ear congestion, ear pain, fever, headaches, heartburn, hemoptysis, myalgias, nasal congestion, postnasal drip, rash, rhinorrhea, shortness of breath, sweats, weight loss or wheezing. She has tried OTC cough suppressant for the symptoms. The treatment provided mild relief. There is no history of asthma, bronchiectasis, bronchitis, COPD, emphysema, environmental allergies or pneumonia.      Review of Systems  Constitutional: Positive for chills. Negative for fever, weight loss, diaphoresis, appetite change and fatigue.  HENT: Positive for sore throat. Negative for congestion, ear pain, postnasal drip, rhinorrhea and trouble swallowing.   Eyes: Negative.   Respiratory: Positive for cough. Negative for apnea, hemoptysis, choking, chest tightness, shortness of breath and wheezing.   Cardiovascular: Negative.  Negative for chest pain, palpitations and leg swelling.  Gastrointestinal: Negative.  Negative for heartburn and abdominal pain.  Endocrine: Negative.   Genitourinary: Negative.   Musculoskeletal: Negative.  Negative for myalgias.  Skin: Negative.  Negative for rash.  Allergic/Immunologic: Negative.  Negative for environmental allergies.  Neurological: Negative.  Negative for headaches.  Hematological: Negative.  Negative for adenopathy. Does not bruise/bleed easily.  Psychiatric/Behavioral: Negative.        Objective:   Physical Exam  Constitutional: She is oriented to person, place, and time. She appears well-developed and well-nourished.  Non-toxic appearance. She does not have a sickly  appearance. She does not appear ill. No distress.  HENT:  Head: Normocephalic and atraumatic.  Mouth/Throat: Oropharynx is clear and moist. No oropharyngeal exudate.  Eyes: Conjunctivae are normal. Right eye exhibits no discharge. Left eye exhibits no discharge. No scleral icterus.  Neck: Normal range of motion. Neck supple. No JVD present. No tracheal deviation present. No thyromegaly present.  Cardiovascular: Normal rate, regular rhythm, normal heart sounds and intact distal pulses.  Exam reveals no gallop and no friction rub.   No murmur heard. Pulmonary/Chest: Effort normal and breath sounds normal. No stridor. No respiratory distress. She has no wheezes. She has no rales. She exhibits no tenderness.  Abdominal: Soft. Bowel sounds are normal. She exhibits no distension and no mass. There is no tenderness. There is no rebound and no guarding.  Musculoskeletal: Normal range of motion. She exhibits no edema or tenderness.  Lymphadenopathy:    She has no cervical adenopathy.  Neurological: She is oriented to person, place, and time.  Skin: Skin is warm and dry. No rash noted. She is not diaphoretic. No erythema. No pallor.  Psychiatric: She has a normal mood and affect. Her behavior is normal. Judgment and thought content normal.  Vitals reviewed.   Lab Results  Component Value Date   WBC 5.0 09/08/2013   HGB 14.3 09/08/2013   HCT 43.0 09/08/2013   PLT 172 09/08/2013   GLUCOSE 94 06/12/2014   CHOL 180 02/06/2014   TRIG 57.0 02/06/2014   HDL 62.20 02/06/2014   LDLDIRECT 158.9 09/05/2012   LDLCALC 106* 02/06/2014   ALT 15 09/11/2013   AST 18 09/11/2013   NA 138 06/12/2014   K 4.5 06/12/2014   CL 108 06/12/2014   CREATININE 0.9 06/12/2014   BUN 22 06/12/2014   CO2 25  06/12/2014   TSH 4.32 02/06/2014   HGBA1C 5.6 08/10/2011        Assessment & Plan:

## 2014-06-24 NOTE — Assessment & Plan Note (Signed)
Will treat the infection with amoxil and will control the cough with phenergan-dm

## 2014-06-24 NOTE — Addendum Note (Signed)
Addended by: Janith Lima on: 06/24/2014 04:54 PM   Modules accepted: Miquel Dunn

## 2014-06-24 NOTE — Patient Instructions (Signed)

## 2014-06-27 ENCOUNTER — Telehealth: Payer: Self-pay | Admitting: Internal Medicine

## 2014-06-27 DIAGNOSIS — R059 Cough, unspecified: Secondary | ICD-10-CM

## 2014-06-27 DIAGNOSIS — R05 Cough: Secondary | ICD-10-CM

## 2014-06-27 DIAGNOSIS — J202 Acute bronchitis due to streptococcus: Secondary | ICD-10-CM

## 2014-06-27 MED ORDER — PROMETHAZINE-DM 6.25-15 MG/5ML PO SYRP: 5.0000 mL | ORAL_SOLUTION | Freq: Four times a day (QID) | ORAL | Status: DC | PRN

## 2014-06-27 NOTE — Telephone Encounter (Signed)
Pls re-new cough syr (Prom-cod) OV if not better Thx

## 2014-06-27 NOTE — Telephone Encounter (Signed)
Written RX up front for pt. To pick up.

## 2014-06-27 NOTE — Telephone Encounter (Signed)
Pt requesting refill of cough syrup, seen 12.28 and given syrup still having cough. [facility] / [address]

## 2014-07-09 ENCOUNTER — Ambulatory Visit (INDEPENDENT_AMBULATORY_CARE_PROVIDER_SITE_OTHER): Payer: PPO | Admitting: Internal Medicine

## 2014-07-09 VITALS — BP 110/64 | HR 59 | Temp 97.8°F | Resp 16 | Ht 70.0 in | Wt 248.0 lb

## 2014-07-09 DIAGNOSIS — I10 Essential (primary) hypertension: Secondary | ICD-10-CM

## 2014-07-09 DIAGNOSIS — E7800 Pure hypercholesterolemia, unspecified: Secondary | ICD-10-CM

## 2014-07-09 DIAGNOSIS — H6993 Unspecified Eustachian tube disorder, bilateral: Secondary | ICD-10-CM

## 2014-07-09 DIAGNOSIS — H6983 Other specified disorders of Eustachian tube, bilateral: Secondary | ICD-10-CM

## 2014-07-09 MED ORDER — PITAVASTATIN CALCIUM 2 MG PO TABS
1.0000 | ORAL_TABLET | Freq: Every day | ORAL | Status: DC
Start: 2014-07-09 — End: 2015-08-25

## 2014-07-09 MED ORDER — IRBESARTAN-HYDROCHLOROTHIAZIDE 300-12.5 MG PO TABS
1.0000 | ORAL_TABLET | Freq: Every day | ORAL | Status: DC
Start: 2014-07-09 — End: 2015-09-02

## 2014-07-09 NOTE — Progress Notes (Signed)
Pre visit review using our clinic review tool, if applicable. No additional management support is needed unless otherwise documented below in the visit note. 

## 2014-07-09 NOTE — Patient Instructions (Signed)
Barotitis Media Barotitis media is inflammation of your middle ear. This occurs when the auditory tube (eustachian tube) leading from the back of your nose (nasopharynx) to your eardrum is blocked. This blockage may result from a cold, environmental allergies, or an upper respiratory infection. Unresolved barotitis media may lead to damage or hearing loss (barotrauma), which may become permanent. HOME CARE INSTRUCTIONS   Use medicines as recommended by your health care provider. Over-the-counter medicines will help unblock the canal and can help during times of air travel.  Do not put anything into your ears to clean or unplug them. Eardrops will not be helpful.  Do not swim, dive, or fly until your health care provider says it is all right to do so. If these activities are necessary, chewing gum with frequent, forceful swallowing may help. It is also helpful to hold your nose and gently blow to pop your ears for equalizing pressure changes. This forces air into the eustachian tube.  Only take over-the-counter or prescription medicines for pain, discomfort, or fever as directed by your health care provider.  A decongestant may be helpful in decongesting the middle ear and make pressure equalization easier. SEEK MEDICAL CARE IF:  You experience a serious form of dizziness in which you feel as if the room is spinning and you feel nauseated (vertigo).  Your symptoms only involve one ear. SEEK IMMEDIATE MEDICAL CARE IF:   You develop a severe headache, dizziness, or severe ear pain.  You have bloody or pus-like drainage from your ears.  You develop a fever.  Your problems do not improve or become worse. MAKE SURE YOU:   Understand these instructions.  Will watch your condition.  Will get help right away if you are not doing well or get worse. Document Released: 06/11/2000 Document Revised: 04/04/2013 Document Reviewed: 01/09/2013 Exit[employer] Patient Information 2015 [employer]. This  information is not intended to replace advice given to you by your health care provider. Make sure you discuss any questions you have with your health care provider.  

## 2014-07-10 ENCOUNTER — Encounter: Payer: Self-pay | Admitting: Internal Medicine

## 2014-07-10 DIAGNOSIS — H6981 Other specified disorders of Eustachian tube, right ear: Secondary | ICD-10-CM | POA: Insufficient documentation

## 2014-07-10 MED ORDER — METHYLPREDNISOLONE ACETATE 80 MG/ML IJ SUSP: 120.0000 mg | Freq: Once | INTRAMUSCULAR | Status: DC

## 2014-07-10 NOTE — Assessment & Plan Note (Signed)
She has ETD s/p a URI I think systemic steroids will help her so I gave her an injection of depo-medrol IM She does not tolerate decongestants so I will not recommend that

## 2014-07-10 NOTE — Progress Notes (Signed)
   Subjective:    Patient ID: Claudia [name], female    DOB: [birth year], 81 y.o.   MRN: [id number]  HPI  She is recovering from a URI and in some ways she feels better but now complains of "popping" in both ears with some dizziness.  Review of Systems  Constitutional: Negative.  Negative for fever, chills, diaphoresis, appetite change and fatigue.  HENT: Positive for congestion, ear pain, postnasal drip and rhinorrhea. Negative for facial swelling, hearing loss, nosebleeds, sinus pressure, sneezing, sore throat and trouble swallowing.   Eyes: Negative.  Negative for visual disturbance.  Respiratory: Negative.  Negative for cough, choking, chest tightness, shortness of breath and stridor.   Cardiovascular: Negative.  Negative for chest pain, palpitations and leg swelling.  Gastrointestinal: Negative.  Negative for nausea, vomiting, abdominal pain, diarrhea, constipation and blood in stool.  Endocrine: Negative.   Genitourinary: Negative.   Musculoskeletal: Negative.  Negative for myalgias, back pain, arthralgias and neck pain.  Skin: Negative.  Negative for rash.  Allergic/Immunologic: Negative.   Neurological: Positive for dizziness. Negative for tremors, seizures, syncope, facial asymmetry, speech difficulty, weakness, light-headedness, numbness and headaches.  Hematological: Negative.  Negative for adenopathy. Does not bruise/bleed easily.  Psychiatric/Behavioral: Negative.        Objective:   Physical Exam  Constitutional: She is oriented to person, place, and time. She appears well-developed and well-nourished. No distress.  HENT:  Head: Normocephalic and atraumatic.  Right Ear: Hearing, tympanic membrane, external ear and ear canal normal.  Left Ear: Hearing, tympanic membrane, external ear and ear canal normal.  Nose: Mucosal edema present. No rhinorrhea or sinus tenderness. No epistaxis. Right sinus exhibits no maxillary sinus tenderness and no frontal sinus tenderness.  Left sinus exhibits no maxillary sinus tenderness and no frontal sinus tenderness.  Mouth/Throat: Oropharynx is clear and moist and mucous membranes are normal. Mucous membranes are not pale, not dry and not cyanotic. No oral lesions. No trismus in the jaw. No uvula swelling. No oropharyngeal exudate, posterior oropharyngeal edema, posterior oropharyngeal erythema or tonsillar abscesses.  Eyes: Conjunctivae are normal. Right eye exhibits no discharge. Left eye exhibits no discharge. No scleral icterus.  Neck: Normal range of motion. Neck supple. No JVD present. No tracheal deviation present. No thyromegaly present.  Cardiovascular: Normal rate, regular rhythm, normal heart sounds and intact distal pulses.  Exam reveals no gallop and no friction rub.   No murmur heard. Pulmonary/Chest: Effort normal and breath sounds normal. No stridor. No respiratory distress. She has no wheezes. She has no rales. She exhibits no tenderness.  Abdominal: Soft. Bowel sounds are normal. She exhibits no distension and no mass. There is no tenderness. There is no rebound and no guarding.  Musculoskeletal: Normal range of motion. She exhibits no edema or tenderness.  Lymphadenopathy:    She has no cervical adenopathy.  Neurological: She is oriented to person, place, and time. She has normal strength. She displays no atrophy, no tremor and normal reflexes. No cranial nerve deficit or sensory deficit. She exhibits normal muscle tone. She displays a negative Romberg sign. She displays no seizure activity. Coordination and gait normal.  Skin: Skin is warm and dry. No rash noted. She is not diaphoretic. No erythema. No pallor.  Vitals reviewed.         Assessment & Plan:

## 2014-07-10 NOTE — Assessment & Plan Note (Signed)
Her BP is well controlled 

## 2014-07-15 ENCOUNTER — Ambulatory Visit: Payer: Medicare Other | Admitting: Internal Medicine

## 2014-08-02 ENCOUNTER — Telehealth: Payer: Self-pay

## 2014-08-02 ENCOUNTER — Other Ambulatory Visit: Payer: Self-pay

## 2014-08-02 DIAGNOSIS — M5440 Lumbago with sciatica, unspecified side: Secondary | ICD-10-CM

## 2014-08-02 DIAGNOSIS — M545 Low back pain, unspecified: Secondary | ICD-10-CM

## 2014-08-02 DIAGNOSIS — M47817 Spondylosis without myelopathy or radiculopathy, lumbosacral region: Secondary | ICD-10-CM

## 2014-08-02 DIAGNOSIS — M961 Postlaminectomy syndrome, not elsewhere classified: Secondary | ICD-10-CM

## 2014-08-02 DIAGNOSIS — M170 Bilateral primary osteoarthritis of knee: Secondary | ICD-10-CM

## 2014-08-02 MED ORDER — HYDROCODONE-ACETAMINOPHEN 5-325 MG PO TABS: 1.0000 | ORAL_TABLET | Freq: Four times a day (QID) | ORAL | Status: DC | PRN

## 2014-08-02 NOTE — Telephone Encounter (Signed)
Pharmacy called and wanted to know how/if they could fill the rx for Norco.   There were 3 written in December for the pharmacy to hold. 1 said December, 1 said February and 1 said March. January was skipped. Verbal okay to fill for January.  Noted and changed on the med list to reflect.

## 2014-10-01 ENCOUNTER — Telehealth: Payer: Self-pay | Admitting: Internal Medicine

## 2014-10-01 DIAGNOSIS — M47817 Spondylosis without myelopathy or radiculopathy, lumbosacral region: Secondary | ICD-10-CM

## 2014-10-01 DIAGNOSIS — M961 Postlaminectomy syndrome, not elsewhere classified: Secondary | ICD-10-CM

## 2014-10-01 DIAGNOSIS — M5440 Lumbago with sciatica, unspecified side: Secondary | ICD-10-CM

## 2014-10-01 DIAGNOSIS — M545 Low back pain, unspecified: Secondary | ICD-10-CM

## 2014-10-01 DIAGNOSIS — M170 Bilateral primary osteoarthritis of knee: Secondary | ICD-10-CM

## 2014-10-01 MED ORDER — HYDROCODONE-ACETAMINOPHEN 5-325 MG PO TABS: 1.0000 | ORAL_TABLET | Freq: Four times a day (QID) | ORAL | Status: DC | PRN

## 2014-10-01 NOTE — Telephone Encounter (Signed)
Pt called in needing refill on her HYDROcodone-acetaminophen (NORCO/VICODIN) 5-325 MG per tablet [[id number]]

## 2014-10-01 NOTE — Telephone Encounter (Signed)
done

## 2014-10-02 MED ORDER — HYDROCODONE-ACETAMINOPHEN 5-325 MG PO TABS: 1.0000 | ORAL_TABLET | Freq: Four times a day (QID) | ORAL | Status: DC | PRN

## 2014-10-02 NOTE — Telephone Encounter (Signed)
Notified pt rx ready for pick-up.../lmb 

## 2014-10-02 NOTE — Telephone Encounter (Signed)
Reprinted script md hit phone-in pt has to pick up control...Johny Chess

## 2014-10-14 ENCOUNTER — Ambulatory Visit (INDEPENDENT_AMBULATORY_CARE_PROVIDER_SITE_OTHER): Payer: PPO | Admitting: Internal Medicine

## 2014-10-14 ENCOUNTER — Encounter: Payer: Self-pay | Admitting: Internal Medicine

## 2014-10-14 ENCOUNTER — Other Ambulatory Visit (INDEPENDENT_AMBULATORY_CARE_PROVIDER_SITE_OTHER): Payer: PPO

## 2014-10-14 ENCOUNTER — Ambulatory Visit: Payer: Medicare Other | Admitting: Internal Medicine

## 2014-10-14 VITALS — BP 140/80 | HR 72 | Temp 97.5°F | Resp 18 | Wt 245.0 lb

## 2014-10-14 DIAGNOSIS — M47817 Spondylosis without myelopathy or radiculopathy, lumbosacral region: Secondary | ICD-10-CM | POA: Diagnosis not present

## 2014-10-14 DIAGNOSIS — M545 Low back pain, unspecified: Secondary | ICD-10-CM

## 2014-10-14 DIAGNOSIS — M170 Bilateral primary osteoarthritis of knee: Secondary | ICD-10-CM

## 2014-10-14 DIAGNOSIS — E038 Other specified hypothyroidism: Secondary | ICD-10-CM

## 2014-10-14 DIAGNOSIS — I10 Essential (primary) hypertension: Secondary | ICD-10-CM

## 2014-10-14 DIAGNOSIS — M5440 Lumbago with sciatica, unspecified side: Secondary | ICD-10-CM

## 2014-10-14 DIAGNOSIS — M961 Postlaminectomy syndrome, not elsewhere classified: Secondary | ICD-10-CM

## 2014-10-14 LAB — BASIC METABOLIC PANEL
BUN: 24 mg/dL — ABNORMAL HIGH (ref 6–23)
CHLORIDE: 108 meq/L (ref 96–112)
CO2: 27 meq/L (ref 19–32)
Calcium: 10.1 mg/dL (ref 8.4–10.5)
Creatinine, Ser: 0.9 mg/dL (ref 0.40–1.20)
GFR: 77.75 mL/min (ref >60.00)
GLUCOSE: 91 mg/dL (ref 70–99)
POTASSIUM: 4.5 meq/L (ref 3.5–5.1)
SODIUM: 140 meq/L (ref 135–145)

## 2014-10-14 LAB — TSH: TSH: 4.03 u[IU]/mL (ref 0.35–4.50)

## 2014-10-14 MED ORDER — HYDROCODONE-ACETAMINOPHEN 5-325 MG PO TABS: 1.0000 | ORAL_TABLET | Freq: Four times a day (QID) | ORAL | Status: DC | PRN

## 2014-10-14 NOTE — Assessment & Plan Note (Signed)
Her BP is well controlled Lytes and renal function are stable 

## 2014-10-14 NOTE — Patient Instructions (Signed)

## 2014-10-14 NOTE — Progress Notes (Signed)
Pre visit review using our clinic review tool, if applicable. No additional management support is needed unless otherwise documented below in the visit note. 

## 2014-10-14 NOTE — Assessment & Plan Note (Signed)
Her TSh remains normal Will observe off of thyroid replacement for now

## 2014-10-14 NOTE — Progress Notes (Signed)
   Subjective:    Patient ID: Claudia [name], female    DOB: 04/28/1934, 81 y.o.   MRN: [id number]  Hypertension This is a chronic problem. The current episode started more than 1 year ago. The problem is unchanged. The problem is controlled. Pertinent negatives include no anxiety, blurred vision, chest pain, headaches, malaise/fatigue, neck pain, orthopnea, palpitations, peripheral edema, PND, shortness of breath or sweats. Past treatments include diuretics and angiotensin blockers. The current treatment provides moderate improvement. Compliance problems include diet and exercise.       Review of Systems  Constitutional: Negative.  Negative for fever, chills, malaise/fatigue, diaphoresis, appetite change and fatigue.  HENT: Negative.   Eyes: Negative.  Negative for blurred vision.  Respiratory: Negative.  Negative for cough, choking, chest tightness, shortness of breath and stridor.   Cardiovascular: Negative.  Negative for chest pain, palpitations, orthopnea, leg swelling and PND.  Gastrointestinal: Negative.  Negative for nausea, vomiting, abdominal pain, diarrhea, constipation and blood in stool.  Endocrine: Negative.   Genitourinary: Negative.   Musculoskeletal: Positive for back pain and arthralgias. Negative for joint swelling, gait problem and neck pain.  Skin: Negative.  Negative for rash.  Allergic/Immunologic: Negative.   Neurological: Negative.  Negative for dizziness, syncope, speech difficulty, weakness, light-headedness and headaches.  Hematological: Negative.  Negative for adenopathy. Does not bruise/bleed easily.  Psychiatric/Behavioral: Negative.        Objective:   Physical Exam  Constitutional: She is oriented to person, place, and time. She appears well-developed and well-nourished. No distress.  HENT:  Head: Normocephalic and atraumatic.  Mouth/Throat: Oropharynx is clear and moist. No oropharyngeal exudate.  Eyes: Conjunctivae are normal. Right eye exhibits  no discharge. Left eye exhibits no discharge. No scleral icterus.  Neck: Normal range of motion. Neck supple. No JVD present. No tracheal deviation present. No thyromegaly present.  Cardiovascular: Normal rate, regular rhythm, normal heart sounds and intact distal pulses.  Exam reveals no gallop and no friction rub.   No murmur heard. Pulmonary/Chest: Effort normal and breath sounds normal. No stridor. No respiratory distress. She has no wheezes. She has no rales. She exhibits no tenderness.  Abdominal: Soft. Bowel sounds are normal. She exhibits no distension and no mass. There is no tenderness. There is no rebound and no guarding.  Musculoskeletal: Normal range of motion. She exhibits no edema or tenderness.  Lymphadenopathy:    She has no cervical adenopathy.  Neurological: She is oriented to person, place, and time.  Skin: Skin is warm and dry. No rash noted. She is not diaphoretic. No erythema. No pallor.  Vitals reviewed.    Lab Results  Component Value Date   WBC 5.0 09/08/2013   HGB 14.3 09/08/2013   HCT 43.0 09/08/2013   PLT 172 09/08/2013   GLUCOSE 94 06/12/2014   CHOL 180 02/06/2014   TRIG 57.0 02/06/2014   HDL 62.20 02/06/2014   LDLDIRECT 158.9 09/05/2012   LDLCALC 106* 02/06/2014   ALT 15 09/11/2013   AST 18 09/11/2013   NA 138 06/12/2014   K 4.5 06/12/2014   CL 108 06/12/2014   CREATININE 0.9 06/12/2014   BUN 22 06/12/2014   CO2 25 06/12/2014   TSH 4.32 02/06/2014   HGBA1C 5.6 08/10/2011       Assessment & Plan:

## 2014-12-29 ENCOUNTER — Encounter (HOSPITAL_COMMUNITY): Payer: Self-pay | Admitting: *Deleted

## 2014-12-29 ENCOUNTER — Emergency Department (HOSPITAL_COMMUNITY)
Admission: EM | Admit: 2014-12-29 | Discharge: 2014-12-29 | Disposition: A | Discharge status: Home / Self Care | Payer: PPO | Source: Home / Self Care | Attending: Physician Assistant | Admitting: Physician Assistant

## 2014-12-29 DIAGNOSIS — R197 Diarrhea, unspecified: Secondary | ICD-10-CM | POA: Diagnosis not present

## 2014-12-29 DIAGNOSIS — E860 Dehydration: Secondary | ICD-10-CM

## 2014-12-29 DIAGNOSIS — R112 Nausea with vomiting, unspecified: Secondary | ICD-10-CM | POA: Diagnosis not present

## 2014-12-29 LAB — POCT I-STAT, CHEM 8
BUN: 20 mg/dL (ref 6–20)
CHLORIDE: 109 mmol/L (ref 101–111)
CREATININE: 0.9 mg/dL (ref 0.44–1.00)
Calcium, Ion: 1.21 mmol/L (ref 1.13–1.30)
Glucose, Bld: 86 mg/dL (ref 65–99)
HCT: 39 % (ref 36.0–46.0)
Hemoglobin: 13.3 g/dL (ref 12.0–15.0)
POTASSIUM: 4.5 mmol/L (ref 3.5–5.1)
SODIUM: 140 mmol/L (ref 135–145)
TCO2: 20 mmol/L (ref 0–100)

## 2014-12-29 MED ORDER — ONDANSETRON HCL 4 MG/2ML IJ SOLN
INTRAMUSCULAR | Status: AC
  Filled 2014-12-29: qty 2

## 2014-12-29 MED ORDER — ONDANSETRON HCL 4 MG/2ML IJ SOLN
4.0000 mg | Freq: Once | INTRAMUSCULAR | Status: AC
  Administered 2014-12-29: 4 mg via INTRAVENOUS

## 2014-12-29 MED ORDER — ONDANSETRON 4 MG PO TBDP: 4.0000 mg | ORAL_TABLET | Freq: Three times a day (TID) | ORAL | Status: DC | PRN

## 2014-12-29 NOTE — ED Notes (Signed)
Iv  Ns  1  Iter  Bolus    Via  20 angio  Site  Patent

## 2014-12-29 NOTE — Discharge Instructions (Signed)
You likely have some food poisoning or a viral gastroenteritis.  We gave you some IV fluids today which helped you to feel better.  We gave you zofran for nausea and vomiting in the IV today. Please take the home zofran every 8 hours as needed. Try to stay hydrated as best as possible.  Please go to the ER if you symptoms worsen.   Viral Gastroenteritis Viral gastroenteritis is also known as stomach flu. This condition affects the stomach and intestinal tract. It can cause sudden diarrhea and vomiting. The illness typically lasts 3 to 8 days. Most people develop an immune response that eventually gets rid of the virus. While this natural response develops, the virus can make you quite ill. CAUSES  Many different viruses can cause gastroenteritis, such as rotavirus or noroviruses. You can catch one of these viruses by consuming contaminated food or water. You may also catch a virus by sharing utensils or other personal items with an infected person or by touching a contaminated surface. SYMPTOMS  The most common symptoms are diarrhea and vomiting. These problems can cause a severe loss of body fluids (dehydration) and a body salt (electrolyte) imbalance. Other symptoms may include:  Fever.  Headache.  Fatigue.  Abdominal pain. DIAGNOSIS  Your caregiver can usually diagnose viral gastroenteritis based on your symptoms and a physical exam. A stool sample may also be taken to test for the presence of viruses or other infections. TREATMENT  This illness typically goes away on its own. Treatments are aimed at rehydration. The most serious cases of viral gastroenteritis involve vomiting so severely that you are not able to keep fluids down. In these cases, fluids must be given through an intravenous line (IV). HOME CARE INSTRUCTIONS   Drink enough fluids to keep your urine clear or pale yellow. Drink small amounts of fluids frequently and increase the amounts as tolerated.  Ask your caregiver  for specific rehydration instructions.  Avoid:  Foods high in sugar.  Alcohol.  Carbonated drinks.  Tobacco.  Juice.  Caffeine drinks.  Extremely hot or cold fluids.  Fatty, greasy foods.  Too much intake of anything at one time.  Dairy products until 24 to 48 hours after diarrhea stops.  You may consume probiotics. Probiotics are active cultures of beneficial bacteria. They may lessen the amount and number of diarrheal stools in adults. Probiotics can be found in yogurt with active cultures and in supplements.  Wash your hands well to avoid spreading the virus.  Only take over-the-counter or prescription medicines for pain, discomfort, or fever as directed by your caregiver. Do not give aspirin to children. Antidiarrheal medicines are not recommended.  Ask your caregiver if you should continue to take your regular prescribed and over-the-counter medicines.  Keep all follow-up appointments as directed by your caregiver. SEEK IMMEDIATE MEDICAL CARE IF:   You are unable to keep fluids down.  You do not urinate at least once every 6 to 8 hours.  You develop shortness of breath.  You notice blood in your stool or vomit. This may look like coffee grounds.  You have abdominal pain that increases or is concentrated in one small area (localized).  You have persistent vomiting or diarrhea.  You have a fever.  The patient is a child younger than 3 months, and he or she has a fever.  The patient is a child older than 3 months, and he or she has a fever and persistent symptoms.  The patient is a child older  than 3 months, and he or she has a fever and symptoms suddenly get worse.  The patient is a baby, and he or she has no tears when crying. MAKE SURE YOU:   Understand these instructions.  Will watch your condition.  Will get help right away if you are not doing well or get worse. Document Released: 06/14/2005 Document Revised: 09/06/2011 Document Reviewed:  03/31/2011 [facility] Patient Information 2015 [place], [place]. This information is not intended to replace advice given to you by your health care provider. Make sure you discuss any questions you have with your health care provider.

## 2014-12-29 NOTE — ED Notes (Signed)
Pt  Has  Symptoms  Of  Nausea   /  Vomiting  /  Diarrhea         -  Pt  Reports  Symptoms  For  Several  Days       Pt    Has  Tolerated  Water  Only  And  Has  A  Dry  Mucous  Membranes

## 2014-12-29 NOTE — ED Provider Notes (Signed)
CSN: [id number]     Arrival date & time 12/29/14  1303 History   None    Chief Complaint  Patient presents with  . Nausea   HPI  6 yof presents with n/v, diarrhea. Sx started 2 days ago. Went to family cook out Fri evening. Came home that night, felt nauseated. That night started having diarrhea and vomiting. Have persisted till now. Approx 10 episodes each. All non bloody. Episodes have been slowly decreasing in frequency. Temp at home 102 yest, no fever today. No anti pyretics today. Tried some Pedialyte yest but couldn't keep it down. Has been keeping down sips of water but nothing else. No abd pain just upset stomach. Mild HA this am. No dizziness.   Past Medical History  Diagnosis Date  . Osteoarthritis   . Pure hypercholesterolemia   . Essential hypertension, benign   . Gouty arthropathy   . Lumbago   . Degeneration of lumbar or lumbosacral intervertebral disc   . Brachial neuritis or radiculitis NOS   . Unspecified hypothyroidism   . Obesity   . Abnormality of gait    Past Surgical History  Procedure Laterality Date  . Right knee replacement  03/13/2010  . Lumbar laminectomy  03/13/2010  . Abdominal hysterectomy  03/13/2010  . Back surgery     Family History  Problem Relation Age of Onset  . Heart disease Father   . Hypertension Mother   . Alzheimer's disease Mother   . Cancer Neg Hx   . Kidney disease Neg Hx   . Diabetes Sister   . Cirrhosis Brother    History  Substance Use Topics  . Smoking status: Never Smoker   . Smokeless tobacco: Never Used  . Alcohol Use: No     Comment: social   OB History    No data available     Review of Systems No cp, sob, chills.  Allergies  Lipitor and Trileptal  Home Medications   Prior to Admission medications   Medication Sig Start Date End Date Taking? Authorizing Provider  aspirin 81 MG tablet Take 81 mg by mouth daily.    Historical Provider, MD  cetirizine (ZYRTEC) 10 MG tablet Take 10 mg by mouth daily.     Historical Provider, MD  HYDROcodone-acetaminophen (NORCO/VICODIN) 5-325 MG per tablet Take 1 tablet by mouth every 6 (six) hours as needed for moderate pain. 10/14/14   Janith Lima, MD  irbesartan-hydrochlorothiazide (AVALIDE) 300-12.5 MG per tablet Take 1 tablet by mouth daily. 07/09/14   Janith Lima, MD  Multiple Vitamins-Minerals (ULTRA WOMENS PACK) MISC Take 1 tablet by mouth daily.    Historical Provider, MD  ondansetron (ZOFRAN ODT) 4 MG disintegrating tablet Take 1 tablet (4 mg total) by mouth every 8 (eight) hours as needed for nausea or vomiting. 12/29/14   Araceli Bouche, PA  Pitavastatin Calcium (LIVALO) 2 MG TABS Take 1 tablet (2 mg total) by mouth daily. 07/09/14   Janith Lima, MD   BP 142/73 mmHg  Pulse 62  Temp(Src) 98.1 F (36.7 C) (Oral)  Resp 20  SpO2 99% Physical Exam  Constitutional: She is oriented to person, place, and time. She appears well-developed and well-nourished.  Non-toxic appearance. She does not have a sickly appearance. She appears ill. No distress.  Cardiovascular: Normal rate, regular rhythm and normal heart sounds.  Exam reveals no S3 and no S4.   Abdominal: Soft. Normal appearance and bowel sounds are normal. There is generalized tenderness. There is no rigidity,  no rebound, no guarding, no CVA tenderness, no tenderness at McBurney's point and negative Murphy's sign.  Neurological: She is alert and oriented to person, place, and time.  Psychiatric: She has a normal mood and affect. Her speech is normal and behavior is normal.    ED Course  Procedures (including critical care time) Labs Review  Results for orders placed or performed during the hospital encounter of 12/29/14  I-STAT, chem 8  Result Value Ref Range   Sodium 140 135 - 145 mmol/L   Potassium 4.5 3.5 - 5.1 mmol/L   Chloride 109 101 - 111 mmol/L   BUN 20 6 - 20 mg/dL   Creatinine, Ser 0.90 0.44 - 1.00 mg/dL   Glucose, Bld 86 65 - 99 mg/dL   Calcium, Ion 1.21 1.13 - 1.30 mmol/L    TCO2 20 0 - 100 mmol/L   Hemoglobin 13.3 12.0 - 15.0 g/dL   HCT 39.0 36.0 - 46.0 %   Imaging Review No results found.  MDM   1. Nausea and vomiting, vomiting of unspecified type   2. Diarrhea   3. Dehydration    Suspect viral gastroenteritis vs food poisoning. Vitals and i-stat normal and reassuring. No focal abd ttp, no rebound, no guarding. Pt much improved after 4 mg IV zofran and 1L IVF. No hx cad or chf. Pt ok to dc home with instructions to rtc if sx worsen. #20 zofran given for home. Discussed importance of staying hydrated. Discussed starting with bland foods when she is feeling ready.     Araceli Bouche, [place] 12/30/14 [phone]

## 2015-01-30 ENCOUNTER — Telehealth: Payer: Self-pay | Admitting: *Deleted

## 2015-01-30 DIAGNOSIS — M545 Low back pain, unspecified: Secondary | ICD-10-CM

## 2015-01-30 DIAGNOSIS — M170 Bilateral primary osteoarthritis of knee: Secondary | ICD-10-CM

## 2015-01-30 DIAGNOSIS — M961 Postlaminectomy syndrome, not elsewhere classified: Secondary | ICD-10-CM

## 2015-01-30 DIAGNOSIS — M5440 Lumbago with sciatica, unspecified side: Secondary | ICD-10-CM

## 2015-01-30 DIAGNOSIS — M47817 Spondylosis without myelopathy or radiculopathy, lumbosacral region: Secondary | ICD-10-CM

## 2015-01-30 MED ORDER — HYDROCODONE-ACETAMINOPHEN 5-325 MG PO TABS: 1.0000 | ORAL_TABLET | Freq: Four times a day (QID) | ORAL | Status: DC | PRN

## 2015-01-30 NOTE — Telephone Encounter (Signed)
Received call pt requesting refill on her hydrocodone.../lmb 

## 2015-03-04 ENCOUNTER — Telehealth: Payer: Self-pay | Admitting: *Deleted

## 2015-03-04 DIAGNOSIS — M545 Low back pain, unspecified: Secondary | ICD-10-CM

## 2015-03-04 DIAGNOSIS — M170 Bilateral primary osteoarthritis of knee: Secondary | ICD-10-CM

## 2015-03-04 DIAGNOSIS — M5440 Lumbago with sciatica, unspecified side: Secondary | ICD-10-CM

## 2015-03-04 DIAGNOSIS — M961 Postlaminectomy syndrome, not elsewhere classified: Secondary | ICD-10-CM

## 2015-03-04 DIAGNOSIS — M47817 Spondylosis without myelopathy or radiculopathy, lumbosacral region: Secondary | ICD-10-CM

## 2015-03-04 MED ORDER — HYDROCODONE-ACETAMINOPHEN 5-325 MG PO TABS: 1.0000 | ORAL_TABLET | Freq: Four times a day (QID) | ORAL | Status: DC | PRN

## 2015-03-04 NOTE — Telephone Encounter (Signed)
Pt requesting refill on her hydrocodone.../lmb 

## 2015-03-31 ENCOUNTER — Other Ambulatory Visit: Payer: Self-pay | Admitting: Internal Medicine

## 2015-03-31 DIAGNOSIS — M961 Postlaminectomy syndrome, not elsewhere classified: Secondary | ICD-10-CM

## 2015-03-31 DIAGNOSIS — M47817 Spondylosis without myelopathy or radiculopathy, lumbosacral region: Secondary | ICD-10-CM

## 2015-03-31 DIAGNOSIS — M5440 Lumbago with sciatica, unspecified side: Secondary | ICD-10-CM

## 2015-03-31 DIAGNOSIS — M545 Low back pain, unspecified: Secondary | ICD-10-CM

## 2015-03-31 DIAGNOSIS — M170 Bilateral primary osteoarthritis of knee: Secondary | ICD-10-CM

## 2015-03-31 NOTE — Telephone Encounter (Signed)
Patient is requesting a refill on hydrocodone.

## 2015-03-31 NOTE — Telephone Encounter (Signed)
Ok to rf? Dr. Jones out of office  

## 2015-04-02 ENCOUNTER — Other Ambulatory Visit: Payer: Self-pay

## 2015-04-02 DIAGNOSIS — M170 Bilateral primary osteoarthritis of knee: Secondary | ICD-10-CM

## 2015-04-02 DIAGNOSIS — M5440 Lumbago with sciatica, unspecified side: Secondary | ICD-10-CM

## 2015-04-02 DIAGNOSIS — M47817 Spondylosis without myelopathy or radiculopathy, lumbosacral region: Secondary | ICD-10-CM

## 2015-04-02 DIAGNOSIS — M545 Low back pain, unspecified: Secondary | ICD-10-CM

## 2015-04-02 DIAGNOSIS — M961 Postlaminectomy syndrome, not elsewhere classified: Secondary | ICD-10-CM

## 2015-04-02 MED ORDER — HYDROCODONE-ACETAMINOPHEN 5-325 MG PO TABS: 1.0000 | ORAL_TABLET | Freq: Four times a day (QID) | ORAL | Status: DC | PRN

## 2015-04-02 NOTE — Telephone Encounter (Signed)
Ok to refill hydrocodone rx per dr Tamera Stands to advise patient she can come to office to pick up script---

## 2015-04-02 NOTE — Telephone Encounter (Signed)
Pt called in again regarding notes below

## 2015-04-02 NOTE — Telephone Encounter (Signed)
Pt left msgb on triage requesting status on refill. She states she has 1 pill left for today needing to pick rx up ASAP...Claudia [name]

## 2015-04-14 ENCOUNTER — Encounter: Payer: Self-pay | Admitting: Internal Medicine

## 2015-04-14 ENCOUNTER — Ambulatory Visit (INDEPENDENT_AMBULATORY_CARE_PROVIDER_SITE_OTHER): Payer: PPO | Admitting: Internal Medicine

## 2015-04-14 ENCOUNTER — Other Ambulatory Visit (INDEPENDENT_AMBULATORY_CARE_PROVIDER_SITE_OTHER): Payer: PPO

## 2015-04-14 VITALS — BP 138/84 | HR 62 | Temp 98.3°F | Resp 16 | Ht 71.0 in | Wt 245.0 lb

## 2015-04-14 DIAGNOSIS — K5900 Constipation, unspecified: Secondary | ICD-10-CM

## 2015-04-14 DIAGNOSIS — E038 Other specified hypothyroidism: Secondary | ICD-10-CM | POA: Diagnosis not present

## 2015-04-14 DIAGNOSIS — M961 Postlaminectomy syndrome, not elsewhere classified: Secondary | ICD-10-CM

## 2015-04-14 DIAGNOSIS — M545 Low back pain, unspecified: Secondary | ICD-10-CM

## 2015-04-14 DIAGNOSIS — I10 Essential (primary) hypertension: Secondary | ICD-10-CM

## 2015-04-14 DIAGNOSIS — Z23 Encounter for immunization: Secondary | ICD-10-CM

## 2015-04-14 DIAGNOSIS — M47817 Spondylosis without myelopathy or radiculopathy, lumbosacral region: Secondary | ICD-10-CM

## 2015-04-14 DIAGNOSIS — M170 Bilateral primary osteoarthritis of knee: Secondary | ICD-10-CM | POA: Diagnosis not present

## 2015-04-14 DIAGNOSIS — M5440 Lumbago with sciatica, unspecified side: Secondary | ICD-10-CM

## 2015-04-14 DIAGNOSIS — K5909 Other constipation: Secondary | ICD-10-CM

## 2015-04-14 LAB — COMPREHENSIVE METABOLIC PANEL
ALK PHOS: 75 U/L (ref 39–117)
ALT: 13 U/L (ref 0–35)
AST: 19 U/L (ref 0–37)
Albumin: 3.9 g/dL (ref 3.5–5.2)
BUN: 18 mg/dL (ref 6–23)
CO2: 28 meq/L (ref 19–32)
Calcium: 9.7 mg/dL (ref 8.4–10.5)
Chloride: 106 mEq/L (ref 96–112)
Creatinine, Ser: 0.91 mg/dL (ref 0.40–1.20)
GFR: 76.67 mL/min (ref >60.00)
GLUCOSE: 102 mg/dL — AB (ref 70–99)
POTASSIUM: 4.5 meq/L (ref 3.5–5.1)
Sodium: 141 mEq/L (ref 135–145)
Total Bilirubin: 0.8 mg/dL (ref 0.2–1.2)
Total Protein: 6.8 g/dL (ref 6.0–8.3)

## 2015-04-14 LAB — CBC WITH DIFFERENTIAL/PLATELET
Basophils Absolute: 0 10*3/uL (ref 0.0–0.1)
Basophils Relative: 0.8 % (ref 0.0–3.0)
EOS PCT: 7.3 % — AB (ref 0.0–5.0)
Eosinophils Absolute: 0.3 10*3/uL (ref 0.0–0.7)
HCT: 39.9 % (ref 36.0–46.0)
Hemoglobin: 13.1 g/dL (ref 12.0–15.0)
LYMPHS ABS: 1.8 10*3/uL (ref 0.7–4.0)
Lymphocytes Relative: 38.4 % (ref 12.0–46.0)
MCHC: 32.8 g/dL (ref 30.0–36.0)
MCV: 85.6 fl (ref 78.0–100.0)
MONOS PCT: 7.7 % (ref 3.0–12.0)
Monocytes Absolute: 0.4 10*3/uL (ref 0.1–1.0)
NEUTROS ABS: 2.1 10*3/uL (ref 1.4–7.7)
NEUTROS PCT: 45.8 % (ref 43.0–77.0)
Platelets: 191 10*3/uL (ref 150.0–400.0)
RBC: 4.67 Mil/uL (ref 3.87–5.11)
RDW: 14.5 % (ref 11.5–15.5)
WBC: 4.7 10*3/uL (ref 4.0–10.5)

## 2015-04-14 LAB — TSH: TSH: 3.94 u[IU]/mL (ref 0.35–4.50)

## 2015-04-14 MED ORDER — HYDROCODONE-ACETAMINOPHEN 5-325 MG PO TABS: 1.0000 | ORAL_TABLET | Freq: Four times a day (QID) | ORAL | Status: DC | PRN

## 2015-04-14 MED ORDER — LINACLOTIDE 290 MCG PO CAPS: 290.0000 ug | ORAL_CAPSULE | Freq: Every day | ORAL | Status: DC

## 2015-04-14 NOTE — Progress Notes (Signed)
Pre visit review using our clinic review tool, if applicable. No additional management support is needed unless otherwise documented below in the visit note. 

## 2015-04-14 NOTE — Progress Notes (Signed)
Subjective:  Patient ID: Claudia [name], female    DOB: 1933/10/05  Age: 81 y.o. MRN: [id number]  CC: Hypertension; Back Pain; and Osteoarthritis   HPI Claudia [name] presents for f/up on HTN and refill of pain med. She complains of constipation.  Outpatient Prescriptions Prior to Visit  Medication Sig Dispense Refill  . aspirin 81 MG tablet Take 81 mg by mouth daily.    . cetirizine (ZYRTEC) 10 MG tablet Take 10 mg by mouth daily.    . irbesartan-hydrochlorothiazide (AVALIDE) 300-12.5 MG per tablet Take 1 tablet by mouth daily. 90 tablet 3  . Multiple Vitamins-Minerals (ULTRA WOMENS PACK) MISC Take 1 tablet by mouth daily.    . Pitavastatin Calcium (LIVALO) 2 MG TABS Take 1 tablet (2 mg total) by mouth daily. 90 tablet 3  . HYDROcodone-acetaminophen (NORCO/VICODIN) 5-325 MG tablet Take 1 tablet by mouth every 6 (six) hours as needed for moderate pain. 90 tablet 0  . ondansetron (ZOFRAN ODT) 4 MG disintegrating tablet Take 1 tablet (4 mg total) by mouth every 8 (eight) hours as needed for nausea or vomiting. 20 tablet 0   No facility-administered medications prior to visit.    ROS Review of Systems  Constitutional: Negative.  Negative for fever, chills, diaphoresis, appetite change and fatigue.  HENT: Negative.   Eyes: Negative.  Negative for photophobia and visual disturbance.  Respiratory: Negative.  Negative for cough, choking, chest tightness, shortness of breath and stridor.   Cardiovascular: Negative.  Negative for chest pain, palpitations and leg swelling.  Gastrointestinal: Positive for constipation. Negative for nausea, vomiting, abdominal pain, diarrhea, blood in stool, anal bleeding and rectal pain.  Endocrine: Negative.   Genitourinary: Negative.   Musculoskeletal: Positive for back pain and arthralgias. Negative for myalgias, joint swelling, gait problem, neck pain and neck stiffness.  Skin: Negative.  Negative for pallor and rash.  Allergic/Immunologic:  Negative.   Neurological: Negative.  Negative for dizziness, syncope, speech difficulty, weakness, light-headedness, numbness and headaches.  Hematological: Negative.  Negative for adenopathy. Does not bruise/bleed easily.  Psychiatric/Behavioral: Negative.     Objective:  BP 138/84 mmHg  Pulse 62  Temp(Src) 98.3 F (36.8 C) (Oral)  Resp 16  Ht 5\' 11"  (1.803 m)  Wt 245 lb (111.131 kg)  BMI 34.19 kg/m2  SpO2 97%  BP Readings from Last 3 Encounters:  04/14/15 138/84  12/29/14 142/73  10/14/14 140/80    Wt Readings from Last 3 Encounters:  04/14/15 245 lb (111.131 kg)  10/14/14 245 lb (111.131 kg)  07/09/14 248 lb (112.492 kg)    Physical Exam  Constitutional: She is oriented to person, place, and time. No distress.  HENT:  Head: Normocephalic and atraumatic.  Mouth/Throat: Oropharynx is clear and moist. No oropharyngeal exudate.  Eyes: Conjunctivae are normal. Right eye exhibits no discharge. Left eye exhibits no discharge. No scleral icterus.  Neck: Normal range of motion. Neck supple. No JVD present. No tracheal deviation present. No thyromegaly present.  Cardiovascular: Normal rate, regular rhythm, normal heart sounds and intact distal pulses.  Exam reveals no gallop and no friction rub.   No murmur heard. Pulmonary/Chest: Effort normal and breath sounds normal. No stridor. No respiratory distress. She has no wheezes. She has no rales. She exhibits no tenderness.  Abdominal: Soft. Bowel sounds are normal. She exhibits no distension and no mass. There is no tenderness. There is no rebound and no guarding.  Musculoskeletal: Normal range of motion. She exhibits no edema or tenderness.  Lymphadenopathy:  She has no cervical adenopathy.  Neurological: She is oriented to person, place, and time.  Skin: Skin is warm and dry. No rash noted. She is not diaphoretic. No erythema. No pallor.  Vitals reviewed.   Lab Results  Component Value Date   WBC 5.0 09/08/2013   HGB  13.3 12/29/2014   HCT 39.0 12/29/2014   PLT 172 09/08/2013   GLUCOSE 86 12/29/2014   CHOL 180 02/06/2014   TRIG 57.0 02/06/2014   HDL 62.20 02/06/2014   LDLDIRECT 158.9 09/05/2012   LDLCALC 106* 02/06/2014   ALT 15 09/11/2013   AST 18 09/11/2013   NA 140 12/29/2014   K 4.5 12/29/2014   CL 109 12/29/2014   CREATININE 0.90 12/29/2014   BUN 20 12/29/2014   CO2 27 10/14/2014   TSH 4.03 10/14/2014   HGBA1C 5.6 08/10/2011    No results found.  Assessment & Plan:   Claudia [name] was seen today for hypertension, back pain and osteoarthritis.  Diagnoses and all orders for this visit:  Encounter for immunization  Constipation, chronic- will restart linzess -     Linaclotide (LINZESS) 290 MCG CAPS capsule; Take 1 capsule (290 mcg total) by mouth daily. -     Comprehensive metabolic panel; Future -     TSH; Future  Other specified hypothyroidism- her TSh is in the normal range, will cont to monitor off of meds -     TSH; Future  Essential hypertension, benign- her BP is well controlled, lytes and renal function are stable -     Comprehensive metabolic panel; Future -     CBC with Differential/Platelet; Future  Primary osteoarthritis of both knees- will cont norco as needed for pain -     Discontinue: HYDROcodone-acetaminophen (NORCO/VICODIN) 5-325 MG tablet; Take 1 tablet by mouth every 6 (six) hours as needed for moderate pain. -     Discontinue: HYDROcodone-acetaminophen (NORCO/VICODIN) 5-325 MG tablet; Take 1 tablet by mouth every 6 (six) hours as needed for moderate pain. -     Discontinue: HYDROcodone-acetaminophen (NORCO/VICODIN) 5-325 MG tablet; Take 1 tablet by mouth every 6 (six) hours as needed for moderate pain. -     HYDROcodone-acetaminophen (NORCO/VICODIN) 5-325 MG tablet; Take 1 tablet by mouth every 6 (six) hours as needed for moderate pain.  Postlaminectomy syndrome, lumbar region -     Discontinue: HYDROcodone-acetaminophen (NORCO/VICODIN) 5-325 MG tablet; Take 1  tablet by mouth every 6 (six) hours as needed for moderate pain. -     Discontinue: HYDROcodone-acetaminophen (NORCO/VICODIN) 5-325 MG tablet; Take 1 tablet by mouth every 6 (six) hours as needed for moderate pain. -     Discontinue: HYDROcodone-acetaminophen (NORCO/VICODIN) 5-325 MG tablet; Take 1 tablet by mouth every 6 (six) hours as needed for moderate pain. -     HYDROcodone-acetaminophen (NORCO/VICODIN) 5-325 MG tablet; Take 1 tablet by mouth every 6 (six) hours as needed for moderate pain.  Lumbosacral spondylosis without myelopathy  Midline low back pain without sciatica  Midline low back pain with sciatica, sciatica laterality unspecified  Other orders -     Flu Vaccine QUAD 36+ mos IM  I have discontinued Ms. Preusser's ondansetron. I am also having her start on Linaclotide. Additionally, I am having her maintain her ULTRA WOMENS PACK, cetirizine, aspirin, Pitavastatin Calcium, irbesartan-hydrochlorothiazide, and HYDROcodone-acetaminophen.  Meds ordered this encounter  Medications  . Linaclotide (LINZESS) 290 MCG CAPS capsule    Sig: Take 1 capsule (290 mcg total) by mouth daily.    Dispense:  30 capsule  Refill:  11  . DISCONTD: HYDROcodone-acetaminophen (NORCO/VICODIN) 5-325 MG tablet    Sig: Take 1 tablet by mouth every 6 (six) hours as needed for moderate pain.    Dispense:  90 tablet    Refill:  0    Fill on or after 04/14/15  . DISCONTD: HYDROcodone-acetaminophen (NORCO/VICODIN) 5-325 MG tablet    Sig: Take 1 tablet by mouth every 6 (six) hours as needed for moderate pain.    Dispense:  90 tablet    Refill:  0    Fill on or after 05/15/15  . DISCONTD: HYDROcodone-acetaminophen (NORCO/VICODIN) 5-325 MG tablet    Sig: Take 1 tablet by mouth every 6 (six) hours as needed for moderate pain.    Dispense:  90 tablet    Refill:  0    Fill on or after 06/14/15  . HYDROcodone-acetaminophen (NORCO/VICODIN) 5-325 MG tablet    Sig: Take 1 tablet by mouth every 6 (six) hours  as needed for moderate pain.    Dispense:  90 tablet    Refill:  0    Fill on or after 07/15/15     Follow-up: No Follow-up on file.  [name], MD

## 2015-04-14 NOTE — Patient Instructions (Signed)
Hypertension Hypertension, commonly called high blood pressure, is when the force of blood pumping through your arteries is too strong. Your arteries are the blood vessels that carry blood from your heart throughout your body. A blood pressure reading consists of a higher number over a lower number, such as 110/72. The higher number (systolic) is the pressure inside your arteries when your heart pumps. The lower number (diastolic) is the pressure inside your arteries when your heart relaxes. Ideally you want your blood pressure below 120/80. Hypertension forces your heart to work harder to pump blood. Your arteries may become narrow or stiff. Having untreated or uncontrolled hypertension can cause heart attack, stroke, kidney disease, and other problems. RISK FACTORS Some risk factors for high blood pressure are controllable. Others are not.  Risk factors you cannot control include:   Race. You may be at higher risk if you are African American.  Age. Risk increases with age.  Gender. Men are at higher risk than women before age 45 years. After age 65, women are at higher risk than men. Risk factors you can control include:  Not getting enough exercise or physical activity.  Being overweight.  Getting too much fat, sugar, calories, or salt in your diet.  Drinking too much alcohol. SIGNS AND SYMPTOMS Hypertension does not usually cause signs or symptoms. Extremely high blood pressure (hypertensive crisis) may cause headache, anxiety, shortness of breath, and nosebleed. DIAGNOSIS To check if you have hypertension, your health care provider will measure your blood pressure while you are seated, with your arm held at the level of your heart. It should be measured at least twice using the same arm. Certain conditions can cause a difference in blood pressure between your right and left arms. A blood pressure reading that is higher than normal on one occasion does not mean that you need treatment. If  it is not clear whether you have high blood pressure, you may be asked to return on a different day to have your blood pressure checked again. Or, you may be asked to monitor your blood pressure at home for 1 or more weeks. TREATMENT Treating high blood pressure includes making lifestyle changes and possibly taking medicine. Living a healthy lifestyle can help lower high blood pressure. You may need to change some of your habits. Lifestyle changes may include:  Following the DASH diet. This diet is high in fruits, vegetables, and whole grains. It is low in salt, red meat, and added sugars.  Keep your sodium intake below 2,300 mg per day.  Getting at least 30-45 minutes of aerobic exercise at least 4 times per week.  Losing weight if necessary.  Not smoking.  Limiting alcoholic beverages.  Learning ways to reduce stress. Your health care provider may prescribe medicine if lifestyle changes are not enough to get your blood pressure under control, and if one of the following is true:  You are 18-59 years of age and your systolic blood pressure is above 140.  You are 60 years of age or older, and your systolic blood pressure is above 150.  Your diastolic blood pressure is above 90.  You have diabetes, and your systolic blood pressure is over 140 or your diastolic blood pressure is over 90.  You have kidney disease and your blood pressure is above 140/90.  You have heart disease and your blood pressure is above 140/90. Your personal target blood pressure may vary depending on your medical conditions, your age, and other factors. HOME CARE INSTRUCTIONS    Have your blood pressure rechecked as directed by your health care provider.   Take medicines only as directed by your health care provider. Follow the directions carefully. Blood pressure medicines must be taken as prescribed. The medicine does not work as well when you skip doses. Skipping doses also puts you at risk for  problems.  Do not smoke.   Monitor your blood pressure at home as directed by your health care provider. SEEK MEDICAL CARE IF:   You think you are having a reaction to medicines taken.  You have recurrent headaches or feel dizzy.  You have swelling in your ankles.  You have trouble with your vision. SEEK IMMEDIATE MEDICAL CARE IF:  You develop a severe headache or confusion.  You have unusual weakness, numbness, or feel faint.  You have severe chest or abdominal pain.  You vomit repeatedly.  You have trouble breathing. MAKE SURE YOU:   Understand these instructions.  Will watch your condition.  Will get help right away if you are not doing well or get worse.   This information is not intended to replace advice given to you by your health care provider. Make sure you discuss any questions you have with your health care provider.   Document Released: 06/14/2005 Document Revised: 10/29/2014 Document Reviewed: 04/06/2013 Else[employer] Interactive Patient Education 2016 [employer]  

## 2015-05-06 ENCOUNTER — Telehealth: Payer: Self-pay

## 2015-05-06 NOTE — Telephone Encounter (Signed)
Call to introduce the AW[facility] and the patient agreed to come in 11/15 (tuesday) at 11am

## 2015-05-13 ENCOUNTER — Ambulatory Visit (INDEPENDENT_AMBULATORY_CARE_PROVIDER_SITE_OTHER): Payer: PPO

## 2015-05-13 VITALS — BP 130/70 | Ht 71.0 in | Wt 247.0 lb

## 2015-05-13 DIAGNOSIS — Z0000 Encounter for general adult medical examination without abnormal findings: Secondary | ICD-10-CM | POA: Diagnosis not present

## 2015-05-13 NOTE — Patient Instructions (Addendum)
Claudia [name] , Thank you for taking time to come for your Medicare Wellness Visit. I appreciate your ongoing commitment to your health goals. Please review the following plan we discussed and let me know if I can assist you in the future.   Will diary food intake to determine how many calories you are eating;  Educated regarding online nutrition programs as [email or web address] and [email or web address];   Discussed PSV 23 with Dr. Ronnald Ramp; and can look up more information on the CDC.gov  Taking in calcium due osteopenia  These are the goals we discussed: Goals    . Weight < 200 lb (90.719 kg)      Mediterranean Diet: eating primarily plant-based food such as fruits and vegetables, whole grains, legumes and nuts; replacing butter with healthy fats such as olive oil and canola oil Using herbs and spices instead of salt to flavor food Limiting red meat to no more than a few times a month Eating fish and poultry at least 2 times a week Getting plenty of exercise  High Fructose corn Syrup;   [email or web address]         This is a list of the screening recommended for you and due dates:  Health Maintenance  Topic Date Due  . Pneumonia vaccines (2 of 2 - PPSV23) 10/09/2014  . Flu Shot  01/27/2016  . Tetanus Vaccine  12/31/2017  . DEXA scan (bone density measurement)  Completed  . Shingles Vaccine  Completed    ' Fat and Cholesterol Restricted Diet Getting too much fat and cholesterol in your diet may cause health problems. Following this diet helps keep your fat and cholesterol at normal levels. This can keep you from getting sick. WHAT TYPES OF FAT SHOULD I CHOOSE?  Choose monosaturated and polyunsaturated fats. These are found in foods such as olive oil, canola oil, flaxseeds, walnuts, almonds, and seeds.  Eat more omega-3 fats. Good choices include salmon, mackerel, sardines, tuna, flaxseed oil, and ground flaxseeds.  Limit saturated fats. These  are in animal products such as meats, butter, and cream. They can also be in plant products such as palm oil, palm kernel oil, and coconut oil.   Avoid foods with partially hydrogenated oils in them. These contain trans fats. Examples of foods that have trans fats are stick margarine, some tub margarines, cookies, crackers, and other baked goods. WHAT GENERAL GUIDELINES DO I NEED TO FOLLOW?   Check food labels. Look for the words "trans fat" and "saturated fat."  When preparing a meal:  Fill half of your plate with vegetables and green salads.  Fill one fourth of your plate with whole grains. Look for the word "whole" as the first word in the ingredient list.  Fill one fourth of your plate with lean protein foods.  Limit fruit to two servings a day. Choose fruit instead of juice.  Eat more foods with soluble fiber. Examples of foods with this type of fiber are apples, broccoli, carrots, beans, peas, and barley. Try to get 20-30 g (grams) of fiber per day.  Eat more home-cooked foods. Eat less at restaurants and buffets.  Limit or avoid alcohol.  Limit foods high in starch and sugar.  Limit fried foods.  Cook foods without frying them. Baking, boiling, grilling, and broiling are all great options.  Lose weight if you are overweight. Losing even a small amount of weight can help your overall health. It can also help prevent diseases such as diabetes and heart disease. WHAT FOODS CAN  I EAT? Grains Whole grains, such as whole wheat or whole grain breads, crackers, cereals, and pasta. Unsweetened oatmeal, bulgur, barley, quinoa, or brown rice. Corn or whole wheat flour tortillas. Vegetables Fresh or frozen vegetables (raw, steamed, roasted, or grilled). Green salads. Fruits All fresh, canned (in natural juice), or frozen fruits. Meat and Other Protein Products Ground beef (85% or leaner), grass-fed beef, or beef trimmed of fat. Skinless chicken or [place]. Ground chicken or [place].  Pork trimmed of fat. All fish and seafood. Eggs. Dried beans, peas, or lentils. Unsalted nuts or seeds. Unsalted canned or dry beans. Dairy Low-fat dairy products, such as skim or 1% milk, 2% or reduced-fat cheeses, low-fat ricotta or cottage cheese, or plain low-fat yogurt. Fats and Oils Tub margarines without trans fats. Light or reduced-fat mayonnaise and salad dressings. Avocado. Olive, canola, sesame, or safflower oils. Natural peanut or almond butter (choose ones without added sugar and oil). The items listed above may not be a complete list of recommended foods or beverages. Contact your dietitian for more options. WHAT FOODS ARE NOT RECOMMENDED? Grains White bread. White pasta. White rice. Cornbread. Bagels, pastries, and croissants. Crackers that contain trans fat. Vegetables White potatoes. Corn. Creamed or fried vegetables. Vegetables in a cheese sauce. Fruits Dried fruits. Canned fruit in light or heavy syrup. Fruit juice. Meat and Other Protein Products Fatty cuts of meat. Ribs, chicken wings, bacon, sausage, bologna, salami, chitterlings, fatback, hot dogs, bratwurst, and packaged luncheon meats. Liver and organ meats. Dairy Whole or 2% milk, cream, half-and-half, and cream cheese. Whole milk cheeses. Whole-fat or sweetened yogurt. Full-fat cheeses. Nondairy creamers and whipped toppings. Processed cheese, cheese spreads, or cheese curds. Sweets and Desserts Corn syrup, sugars, honey, and molasses. Candy. Jam and jelly. Syrup. Sweetened cereals. Cookies, pies, cakes, donuts, muffins, and ice cream. Fats and Oils Butter, stick margarine, lard, shortening, ghee, or bacon fat. Coconut, palm kernel, or palm oils. Beverages Alcohol. Sweetened drinks (such as sodas, lemonade, and fruit drinks or punches). The items listed above may not be a complete list of foods and beverages to avoid. Contact your dietitian for more information.   This information is not intended to replace advice  given to you by your health care provider. Make sure you discuss any questions you have with your health care provider.   Document Released: 12/14/2011 Document Revised: 07/05/2014 Document Reviewed: 09/13/2013 Elsevier Interactive Patient Education 2016 [place] in the Home  Falls can cause injuries. They can happen to people of all ages. There are many things you can do to make your home safe and to help prevent falls.  WHAT CAN I DO ON THE OUTSIDE OF MY HOME?  Regularly fix the edges of walkways and driveways and fix any cracks.  Remove anything that might make you trip as you walk through a door, such as a raised step or threshold.  Trim any bushes or trees on the path to your home.  Use bright outdoor lighting.  Clear any walking paths of anything that might make someone trip, such as rocks or tools.  Regularly check to see if handrails are loose or broken. Make sure that both sides of any steps have handrails.  Any raised decks and porches should have guardrails on the edges.  Have any leaves, snow, or ice cleared regularly.  Use sand or salt on walking paths during winter.  Clean up any spills in your garage right away. This includes oil or grease spills. WHAT CAN I  DO IN THE BATHROOM?   Use night lights.  Install grab bars by the toilet and in the tub and shower. Do not use towel bars as grab bars.  Use non-skid mats or decals in the tub or shower.  If you need to sit down in the shower, use a plastic, non-slip stool.  Keep the floor dry. Clean up any water that spills on the floor as soon as it happens.  Remove soap buildup in the tub or shower regularly.  Attach bath mats securely with double-sided non-slip rug tape.  Do not have throw rugs and other things on the floor that can make you trip. WHAT CAN I DO IN THE BEDROOM?  Use night lights.  Make sure that you have a light by your bed that is easy to reach.  Do not use any sheets or  blankets that are too big for your bed. They should not hang down onto the floor.  Have a firm chair that has side arms. You can use this for support while you get dressed.  Do not have throw rugs and other things on the floor that can make you trip. WHAT CAN I DO IN THE KITCHEN?  Clean up any spills right away.  Avoid walking on wet floors.  Keep items that you use a lot in easy-to-reach places.  If you need to reach something above you, use a strong step stool that has a grab bar.  Keep electrical cords out of the way.  Do not use floor polish or wax that makes floors slippery. If you must use wax, use non-skid floor wax.  Do not have throw rugs and other things on the floor that can make you trip. WHAT CAN I DO WITH MY STAIRS?  Do not leave any items on the stairs.  Make sure that there are handrails on both sides of the stairs and use them. Fix handrails that are broken or loose. Make sure that handrails are as long as the stairways.  Check any carpeting to make sure that it is firmly attached to the stairs. Fix any carpet that is loose or worn.  Avoid having throw rugs at the top or bottom of the stairs. If you do have throw rugs, attach them to the floor with carpet tape.  Make sure that you have a light switch at the top of the stairs and the bottom of the stairs. If you do not have them, ask someone to add them for you. WHAT ELSE CAN I DO TO HELP PREVENT FALLS?  Wear shoes that:  Do not have high heels.  Have rubber bottoms.  Are comfortable and fit you well.  Are closed at the toe. Do not wear sandals.  If you use a stepladder:  Make sure that it is fully opened. Do not climb a closed stepladder.  Make sure that both sides of the stepladder are locked into place.  Ask someone to hold it for you, if possible.  Clearly mark and make sure that you can see:  Any grab bars or handrails.  First and last steps.  Where the edge of each step is.  Use tools  that help you move around (mobility aids) if they are needed. These include:  Canes.  Walkers.  Scooters.  Crutches.  Turn on the lights when you go into a dark area. Replace any light bulbs as soon as they burn out.  Set up your furniture so you have a clear path. Avoid moving  your furniture around.  If any of your floors are uneven, fix them.  If there are any pets around you, be aware of where they are.  Review your medicines with your doctor. Some medicines can make you feel dizzy. This can increase your chance of falling. Ask your doctor what other things that you can do to help prevent falls.   This information is not intended to replace advice given to you by your health care provider. Make sure you discuss any questions you have with your health care provider.   Document Released: 04/10/2009 Document Revised: 10/29/2014 Document Reviewed: 07/19/2014 Elsevier Interactive Patient Education 2016 [place] Maintenance, Female Adopting a healthy lifestyle and getting preventive care can go a long way to promote health and wellness. Talk with your health care provider about what schedule of regular examinations is right for you. This is a good chance for you to check in with your provider about disease prevention and staying healthy. In between checkups, there are plenty of things you can do on your own. Experts have done a lot of research about which lifestyle changes and preventive measures are most likely to keep you healthy. Ask your health care provider for more information. WEIGHT AND DIET  Eat a healthy diet  Be sure to include plenty of vegetables, fruits, low-fat dairy products, and lean protein.  Do not eat a lot of foods high in solid fats, added sugars, or salt.  Get regular exercise. This is one of the most important things you can do for your health.  Most adults should exercise for at least 150 minutes each week. The exercise should increase your  heart rate and make you sweat (moderate-intensity exercise).  Most adults should also do strengthening exercises at least twice a week. This is in addition to the moderate-intensity exercise.  Maintain a healthy weight  Body mass index (BMI) is a measurement that can be used to identify possible weight problems. It estimates body fat based on height and weight. Your health care provider can help determine your BMI and help you achieve or maintain a healthy weight.  For females 47 years of age and older:   A BMI below 18.5 is considered underweight.  A BMI of 18.5 to 24.9 is normal.  A BMI of 25 to 29.9 is considered overweight.  A BMI of 30 and above is considered obese.  Watch levels of cholesterol and blood lipids  You should start having your blood tested for lipids and cholesterol at 79 years of age, then have this test every 5 years.  You may need to have your cholesterol levels checked more often if:  Your lipid or cholesterol levels are high.  You are older than 79 years of age.  You are at high risk for heart disease.  CANCER SCREENING   Lung Cancer  Lung cancer screening is recommended for adults 59-36 years old who are at high risk for lung cancer because of a history of smoking.  A yearly low-dose CT scan of the lungs is recommended for people who:  Currently smoke.  Have quit within the past 15 years.  Have at least a 30-pack-year history of smoking. A pack year is smoking an average of one pack of cigarettes a day for 1 year.  Yearly screening should continue until it has been 15 years since you quit.  Yearly screening should stop if you develop a health problem that would prevent you from having lung cancer treatment.  Breast Cancer  Practice breast self-awareness. This means understanding how your breasts normally appear and feel.  It also means doing regular breast self-exams. Let your health care provider know about any changes, no matter how  small.  If you are in your 20s or 30s, you should have a clinical breast exam (CBE) by a health care provider every 1-3 years as part of a regular health exam.  If you are 54 or older, have a CBE every year. Also consider having a breast X-ray (mammogram) every year.  If you have a family history of breast cancer, talk to your health care provider about genetic screening.  If you are at high risk for breast cancer, talk to your health care provider about having an MRI and a mammogram every year.  Breast cancer gene (BRCA) assessment is recommended for women who have family members with BRCA-related cancers. BRCA-related cancers include:  Breast.  Ovarian.  Tubal.  Peritoneal cancers.  Results of the assessment will determine the need for genetic counseling and BRCA1 and BRCA2 testing. Cervical Cancer Your health care provider may recommend that you be screened regularly for cancer of the pelvic organs (ovaries, uterus, and vagina). This screening involves a pelvic examination, including checking for microscopic changes to the surface of your cervix (Pap test). You may be encouraged to have this screening done every 3 years, beginning at age 68.  For women ages 3-65, health care providers may recommend pelvic exams and Pap testing every 3 years, or they may recommend the Pap and pelvic exam, combined with testing for human papilloma virus (HPV), every 5 years. Some types of HPV increase your risk of cervical cancer. Testing for HPV may also be done on women of any age with unclear Pap test results.  Other health care providers may not recommend any screening for nonpregnant women who are considered low risk for pelvic cancer and who do not have symptoms. Ask your health care provider if a screening pelvic exam is right for you.  If you have had past treatment for cervical cancer or a condition that could lead to cancer, you need Pap tests and screening for cancer for at least 20 years  after your treatment. If Pap tests have been discontinued, your risk factors (such as having a new sexual partner) need to be reassessed to determine if screening should resume. Some women have medical problems that increase the chance of getting cervical cancer. In these cases, your health care provider may recommend more frequent screening and Pap tests. Colorectal Cancer  This type of cancer can be detected and often prevented.  Routine colorectal cancer screening usually begins at 79 years of age and continues through 79 years of age.  Your health care provider may recommend screening at an earlier age if you have risk factors for colon cancer.  Your health care provider may also recommend using home test kits to check for hidden blood in the stool.  A small camera at the end of a tube can be used to examine your colon directly (sigmoidoscopy or colonoscopy). This is done to check for the earliest forms of colorectal cancer.  Routine screening usually begins at age 52.  Direct examination of the colon should be repeated every 5-10 years through 79 years of age. However, you may need to be screened more often if early forms of precancerous polyps or small growths are found. Skin Cancer  Check your skin from head to toe regularly.  Tell your health care  provider about any new moles or changes in moles, especially if there is a change in a mole's shape or color.  Also tell your health care provider if you have a mole that is larger than the size of a pencil eraser.  Always use sunscreen. Apply sunscreen liberally and repeatedly throughout the day.  Protect yourself by wearing long sleeves, pants, a wide-brimmed hat, and sunglasses whenever you are outside. HEART DISEASE, DIABETES, AND HIGH BLOOD PRESSURE   High blood pressure causes heart disease and increases the risk of stroke. High blood pressure is more likely to develop in:  People who have blood pressure in the high end of the  normal range (130-139/85-89 mm Hg).  People who are overweight or obese.  People who are African American.  If you are 85-61 years of age, have your blood pressure checked every 3-5 years. If you are 36 years of age or older, have your blood pressure checked every year. You should have your blood pressure measured twice--once when you are at a hospital or clinic, and once when you are not at a hospital or clinic. Record the average of the two measurements. To check your blood pressure when you are not at a hospital or clinic, you can use:  An automated blood pressure machine at a pharmacy.  A home blood pressure monitor.  If you are between 41 years and 54 years old, ask your health care provider if you should take aspirin to prevent strokes.  Have regular diabetes screenings. This involves taking a blood sample to check your fasting blood sugar level.  If you are at a normal weight and have a low risk for diabetes, have this test once every three years after 79 years of age.  If you are overweight and have a high risk for diabetes, consider being tested at a younger age or more often. PREVENTING INFECTION  Hepatitis B  If you have a higher risk for hepatitis B, you should be screened for this virus. You are considered at high risk for hepatitis B if:  You were born in a country where hepatitis B is common. Ask your health care provider which countries are considered high risk.  Your parents were born in a high-risk country, and you have not been immunized against hepatitis B (hepatitis B vaccine).  You have HIV or AIDS.  You use needles to inject street drugs.  You live with someone who has hepatitis B.  You have had sex with someone who has hepatitis B.  You get hemodialysis treatment.  You take certain medicines for conditions, including cancer, organ transplantation, and autoimmune conditions. Hepatitis C  Blood testing is recommended for:  Everyone born from [birth year]  through 1963.  Anyone with known risk factors for hepatitis C. Sexually transmitted infections (STIs)  You should be screened for sexually transmitted infections (STIs) including gonorrhea and chlamydia if:  You are sexually active and are younger than 79 years of age.  You are older than 79 years of age and your health care provider tells you that you are at risk for this type of infection.  Your sexual activity has changed since you were last screened and you are at an increased risk for chlamydia or gonorrhea. Ask your health care provider if you are at risk.  If you do not have HIV, but are at risk, it may be recommended that you take a prescription medicine daily to prevent HIV infection. This is called pre-exposure prophylaxis (PrEP). You are  considered at risk if:  You are sexually active and do not regularly use condoms or know the HIV status of your partner(s).  You take drugs by injection.  You are sexually active with a partner who has HIV. Talk with your health care provider about whether you are at high risk of being infected with HIV. If you choose to begin PrEP, you should first be tested for HIV. You should then be tested every 3 months for as long as you are taking PrEP.  PREGNANCY   If you are premenopausal and you may become pregnant, ask your health care provider about preconception counseling.  If you may become pregnant, take 400 to 800 micrograms (mcg) of folic acid every day.  If you want to prevent pregnancy, talk to your health care provider about birth control (contraception). OSTEOPOROSIS AND MENOPAUSE   Osteoporosis is a disease in which the bones lose minerals and strength with aging. This can result in serious bone fractures. Your risk for osteoporosis can be identified using a bone density scan.  If you are 80 years of age or older, or if you are at risk for osteoporosis and fractures, ask your health care provider if you should be screened.  Ask your  health care provider whether you should take a calcium or vitamin D supplement to lower your risk for osteoporosis.  Menopause may have certain physical symptoms and risks.  Hormone replacement therapy may reduce some of these symptoms and risks. Talk to your health care provider about whether hormone replacement therapy is right for you.  HOME CARE INSTRUCTIONS   Schedule regular health, dental, and eye exams.  Stay current with your immunizations.   Do not use any tobacco products including cigarettes, chewing tobacco, or electronic cigarettes.  If you are pregnant, do not drink alcohol.  If you are breastfeeding, limit how much and how often you drink alcohol.  Limit alcohol intake to no more than 1 drink per day for nonpregnant women. One drink equals 12 ounces of beer, 5 ounces of wine, or 1 ounces of hard liquor.  Do not use street drugs.  Do not share needles.  Ask your health care provider for help if you need support or information about quitting drugs.  Tell your health care provider if you often feel depressed.  Tell your health care provider if you have ever been abused or do not feel safe at home.   This information is not intended to replace advice given to you by your health care provider. Make sure you discuss any questions you have with your health care provider.   Document Released: 12/28/2010 Document Revised: 07/05/2014 Document Reviewed: 05/16/2013 Elsevier Interactive Patient Education 2016 [employer].  Osteoporosis Osteoporosis is the thinning and loss of density in the bones. Osteoporosis makes the bones more brittle, fragile, and likely to break (fracture). Over time, osteoporosis can cause the bones to become so weak that they fracture after a simple fall. The bones most likely to fracture are the bones in the hip, wrist, and spine. CAUSES  The exact cause is not known. RISK FACTORS Anyone can develop osteoporosis. You may be at greater risk if  you have a family history of the condition or have poor nutrition. You may also have a higher risk if you are:   Female.   77 years old or older.  A smoker.  Not physically active.   White or Asian.  Slender. SIGNS AND SYMPTOMS  A fracture might be the  first sign of the disease, especially if it results from a fall or injury that would not usually cause a bone to break. Other signs and symptoms include:   Low back and neck pain.  Stooped posture.  Height loss. DIAGNOSIS  To make a diagnosis, your health care provider may:  Take a medical history.  Perform a physical exam.  Order tests, such as:  A bone mineral density test.  A dual-energy X-ray absorptiometry test. TREATMENT  The goal of osteoporosis treatment is to strengthen your bones to reduce your risk of a fracture. Treatment may involve:  Making lifestyle changes, such as:  Eating a diet rich in calcium.  Doing weight-bearing and muscle-strengthening exercises.  Stopping tobacco use.  Limiting alcohol intake.  Taking medicine to slow the process of bone loss or to increase bone density.  Monitoring your levels of calcium and vitamin D. HOME CARE INSTRUCTIONS  Include calcium and vitamin D in your diet. Calcium is important for bone health, and vitamin D helps the body absorb calcium.  Perform weight-bearing and muscle-strengthening exercises as directed by your health care provider.  Do not use any tobacco products, including cigarettes, chewing tobacco, and electronic cigarettes. If you need help quitting, ask your health care provider.  Limit your alcohol intake.  Take medicines only as directed by your health care provider.  Keep all follow-up visits as directed by your health care provider. This is important.  Take precautions at home to lower your risk of falling, such as:  Keeping rooms well lit and clutter free.  Installing safety rails on stairs.  Using rubber mats in the bathroom  and other areas that are often wet or slippery. SEEK IMMEDIATE MEDICAL CARE IF:  You fall or injure yourself.    This information is not intended to replace advice given to you by your health care provider. Make sure you discuss any questions you have with your health care provider.   Document Released: 03/24/2005 Document Revised: 07/05/2014 Document Reviewed: 11/22/2013 Elsevier Interactive Patient Education [employer].

## 2015-05-13 NOTE — Progress Notes (Addendum)
Subjective:   Claudia [name] is a 81 y.o. female who presents for Medicare Annual (Subsequent) preventive examination.  Review of Systems:   Cardiac Risk Factors include: advanced age (>45men, >34 women);dyslipidemia;family history of premature cardiovascular disease;obesity (BMI >30kg/m2) HRA assessment completed during visit; Blanchard Kelch The Patient was informed that this wellness visit is to identify risk and educate on how to reduce risk for increase disease through lifestyle changes.   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  Dinner: Dani Gobble; fish; chicken; don't eat a lot of red meat; vegetables  Trouble area; chocolate candy; Portions are reasonable; Bakes cake but does not eat it generally unless it just came out of the oven  Exercise; going to the gym x 3; bike, treadmill; strengthening exercise Stays 1hour and 70min Walks a 1.5 miles on treadmill during her sessions   What has worked in the past for weight loss? 'states she has weighed the same for many years.  Motivated 7; States she seen a friend recently who had bariatric surgery and is doing  great; She plans to discuss with Dr. 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;  Educated on my [email or web address].uy or BuildDNA.es for online options to track diet   SAFETY; Has had no falls Home is one level; no stairs; plans to age in place Safety reviewed for the home; getting ready to remodel the bathroom; eliminating clutter, plans to place rails in  as needed; bathroom safety; community safety; smoke detectors and firearms safety as well as sun protection;  Driving: no accidents Sun protection/ wears sun protection when in the sun Stressors; none at present   Medication review/no issues to date  Mobilization and Functional losses in the last year; no losses of function  Urinary or fecal incontinence reviewed/ no urinary issues; constipation with meds tx with linzess and is helping;    Counseling: Colonoscopy; 08/04/2007; report states it was normal/ Due 2/ 2019 EKG: 08/2013 Hearing: 4000hz  both ears Dexa 06/10/2014 / -1.7 / can't take Vit D; has tried; discussed taking calcium 1200 per day in food or vitamin Mammagram 02/21/2014/ IMPRESSION: No mammographic evidence of malignancy/ generally has q 2 years PAP 11/2009 / hx of hysterectomy Ophthalmology exam; eye exam every year;  Immunizations Due  Prevnar 23 ; will read at the [facility].gov and discuss with Dr. Ronnald Ramp   Current Care Team reviewed and updated      Objective:  Vitals: BP 130/70 mmHg  Ht 5\' 11"  (1.803 m)  Wt 247 lb (112.038 kg)  BMI 34.46 kg/m2  Tobacco History  Smoking status  . Never Smoker   Smokeless tobacco  . Never Used     Counseling given: Not Answered   Past Medical History  Diagnosis Date  . Osteoarthritis   . Pure hypercholesterolemia   . Essential hypertension, benign   . Gouty arthropathy   . Lumbago   . Degeneration of lumbar or  lumbosacral intervertebral disc   . Brachial neuritis or radiculitis NOS   . Unspecified hypothyroidism   . Obesity   . Abnormality of gait    Past Surgical History  Procedure Laterality Date  . Right knee replacement  03/13/2010  . Lumbar laminectomy  03/13/2010  . Abdominal hysterectomy  03/13/2010  . Back surgery     Family History  Problem Relation Age of Onset  . Heart disease Father   . Hypertension Mother   . Alzheimer's disease Mother   . Cancer Neg Hx   . Kidney disease Neg Hx   . Diabetes Sister   . Cirrhosis Brother    History  Sexual Activity  . Sexual Activity: No    Outpatient Encounter Prescriptions as of 05/13/2015  Medication Sig  . aspirin 81 MG tablet Take 81 mg by mouth daily.  . cetirizine (ZYRTEC) 10 MG tablet Take 10 mg by mouth daily.  [name] HYDROcodone-acetaminophen (NORCO/VICODIN) 5-325 MG tablet Take 1 tablet by mouth every 6 (six) hours as needed for moderate pain.  [name] irbesartan-hydrochlorothiazide (AVALIDE) 300-12.5 MG per tablet Take 1 tablet by mouth daily.  . Linaclotide (LINZESS) 290 MCG CAPS capsule Take 1 capsule (290 mcg total) by mouth daily.  . Multiple Vitamins-Minerals (ULTRA WOMENS PACK) MISC Take 1 tablet by mouth daily.  . Pitavastatin Calcium (LIVALO) 2 MG TABS Take 1 tablet (2 mg total) by mouth daily.   No facility-administered encounter medications on file as of 05/13/2015.    Activities of Daily Living In your present state of health, do you have any difficulty performing the following activities: 05/13/2015  Hearing? N  Vision? N  Difficulty concentrating or making decisions? N  Walking or climbing stairs? N  Dressing or bathing? N  Doing errands, shopping? N  Preparing Food and eating ? N  Using the Toilet? N  In the past six months, have you accidently leaked urine? N  Do you have problems with loss of bowel control? N  Managing your Medications? N  Managing your Finances? N  Housekeeping or managing your Housekeeping?  N    Patient Care Team: Janith Lima, MD as PCP - General (Internal Medicine)    Assessment:    Assessment   Patient presents for yearly preventative medicine examination. Medicare questionnaire screening were completed, i.e. Functional; fall risk; depression, memory loss and hearing all unremarkable;  All immunizations and health maintenance protocols were reviewed with the patient and the patient educated on PSV 23 but prefers to discuss with Dr. Ronnald Ramp  Education provided for laboratory screens;    Medication reconciliation, past medical history, social history, problem list and allergies were reviewed in detail with the patient   Goals were established with regard to weight loss, exercise. The patient given information on a low fat; low cholesterol diet and online diaries that she can track calories as well as determine how much fat; protein, sodium and carbs she is eating. Will continue to exercise tiw   End of life planning  was discussed and she has completed; Requested  A copy for the chart   Exercise Activities and Dietary recommendations Current Exercise Habits:: Structured exercise class, Type of exercise: strength training/weights, Time (Minutes): 45, Frequency (Times/Week): 3, Weekly Exercise (Minutes/Week): 135, Intensity: Moderate  Goals    . Weight < 200 lb (90.719 kg)      Mediterranean Diet: eating primarily plant-based food such as fruits and vegetables, whole grains, legumes and nuts; replacing butter with healthy fats such as olive oil and canola oil Using herbs and spices instead of salt to flavor food Limiting red meat to no more than a few times a month Eating fish and poultry at least 2 times a week Getting plenty of exercise  High Fructose corn Syrup;   BikingRewards.pl VRemover.com.ee [email or web address].com        Fall Risk Fall Risk  05/13/2015 07/09/2014 01/05/2013  Falls in the past year? No No No   Depression Screen PHQ 2/9 Scores  05/13/2015 07/09/2014 01/05/2013  PHQ - 2 Score 0 0 0     Cognitive Testing MMSE - Mini Mental State Exam 05/13/2015  Not completed: (No Data)   AD8 score 0; no issues noted; States her mother had Alzheimers;   Immunization History  Administered Date(s) Administered  . DTaP 01/01/2008  . Influenza Split 04/06/2012  . Influenza Whole 05/04/2011  . Influenza, High Dose Seasonal PF 04/17/2013  . Influenza,inj,Quad PF,36+ Mos 04/11/2014, 04/14/2015  . Pneumococcal Conjugate-13 04/02/2008, 10/08/2013  . Tdap 01/01/2008  . Zoster 08/03/2006   Screening Tests Health Maintenance  Topic Date Due  . PNA vac Low Risk Adult (2 of 2 - PPSV23) 10/09/2014  . INFLUENZA VACCINE  01/27/2016  . TETANUS/TDAP  12/31/2017  . DEXA SCAN  Completed  . ZOSTAVAX  Completed      Plan:   Will diary food intake to determine how many calories you are eating;  Educated regarding online nutrition programs as GumSearch.nl and http://vang.com/;   Discussed PSV 23 with Dr. Ronnald Ramp; and can look up more information on the CDC.gov  Taking in calcium due osteopenia / information on osteoporosis provided     During the course of the visit the patient was educated and counseled about the following appropriate screening and preventive services:   Vaccines to include Pneumoccal, Influenza, Hepatitis B, Td, Zostavax, HCV/ will fup with Dr. Ronnald Ramp regarding PSV 23   Electrocardiogram/ 08/2013  Cardiovascular Disease/ BP in good control; exercising; would like to discuss weight loss  Colorectal cancer screening; due in 07/2017  Bone density screening/ 05/2014; educated on Calcium and to continue weight bearing exercise  Diabetes screening/ Fasting normal;  Glaucoma screening/ has eye exam every year  Mammography/PAP/ mammogram q 2 years  Nutrition counseling / discussed nutrition and weight loss; will fup on learn about  Bariatric surgery; as well as track diet to determine how many calories she is  taking per day  Patient Instructions (the written plan) was given to the patient.   [id number], RN  05/13/2015     Medical screening examination/treatment/procedure(s) were performed by non-physician practitioner and as supervising physician I was immediately available for consultation/collaboration. I agree with above. [name], MD

## 2015-08-22 ENCOUNTER — Encounter: Payer: Self-pay | Admitting: Internal Medicine

## 2015-08-22 ENCOUNTER — Ambulatory Visit (INDEPENDENT_AMBULATORY_CARE_PROVIDER_SITE_OTHER): Payer: PPO | Admitting: Internal Medicine

## 2015-08-22 VITALS — BP 130/80 | HR 78 | Temp 97.8°F | Resp 14 | Ht 70.0 in | Wt 251.0 lb

## 2015-08-22 DIAGNOSIS — J069 Acute upper respiratory infection, unspecified: Secondary | ICD-10-CM | POA: Diagnosis not present

## 2015-08-22 MED ORDER — BENZONATATE 100 MG PO CAPS: 100.0000 mg | ORAL_CAPSULE | Freq: Two times a day (BID) | ORAL | Status: DC | PRN

## 2015-08-22 MED ORDER — FLUTICASONE PROPIONATE 50 MCG/ACT NA SUSP: 2.0000 | Freq: Every day | NASAL | Status: DC

## 2015-08-22 NOTE — Progress Notes (Signed)
   Subjective:    Patient ID: Claudia [name], female    DOB: 11-26-1933, 82 y.o.   MRN: [id number]  HPI The patient is a 82 YO female coming in for cough and nasal drainage. Started about 5 days ago. Denies fevers or chills. Denies SOB. Cough non-productive. Has taken her husband's hycodan cough syrup which is helping. Overall mildly better today. Taking zyrtec which is not helping much.   Review of Systems  Constitutional: Negative for fever, chills, activity change, appetite change, fatigue and unexpected weight change.  HENT: Positive for congestion, rhinorrhea and sore throat. Negative for ear discharge, ear pain, postnasal drip, sinus pressure, sneezing and trouble swallowing.   Eyes: Negative.   Respiratory: Positive for cough. Negative for chest tightness, shortness of breath and wheezing.   Cardiovascular: Negative.   Gastrointestinal: Negative.   Musculoskeletal: Negative.       Objective:   Physical Exam  Constitutional: She is oriented to person, place, and time. She appears well-developed and well-nourished.  HENT:  Head: Normocephalic and atraumatic.  Bilateral TMs normal, oropharynx red with mild clear drainage, no nasal crusting.   Eyes: EOM are normal.  Neck: Normal range of motion.  Cardiovascular: Normal rate and regular rhythm.   Pulmonary/Chest: Effort normal and breath sounds normal. No respiratory distress. She has no wheezes. She has no rales.  Abdominal: Soft. Bowel sounds are normal. She exhibits no distension. There is no tenderness. There is no rebound.  Neurological: She is alert and oriented to person, place, and time.   Filed Vitals:   08/22/15 0833  BP: 130/80  Pulse: 78  Temp: 97.8 F (36.6 C)  TempSrc: Oral  Resp: 14  Height: 5\' 10"  (1.778 m)  Weight: 251 lb (113.853 kg)  SpO2: 97%      Assessment & Plan:

## 2015-08-22 NOTE — Patient Instructions (Signed)
We have sent in tessalon perles for the cough that you can take up to 3 times per day.   We have also sent in a nose spray called flonase. Use 2 sprays in each nostril daily for the next 1-2 weeks to help dry up the nasal congestion and drainage.   Upper Respiratory Infection, Adult Most upper respiratory infections (URIs) are a viral infection of the air passages leading to the lungs. A URI affects the nose, throat, and upper air passages. The most common type of URI is nasopharyngitis and is typically referred to as "the common cold." URIs run their course and usually go away on their own. Most of the time, a URI does not require medical attention, but sometimes a bacterial infection in the upper airways can follow a viral infection. This is called a secondary infection. Sinus and middle ear infections are common types of secondary upper respiratory infections. Bacterial pneumonia can also complicate a URI. A URI can worsen asthma and chronic obstructive pulmonary disease (COPD). Sometimes, these complications can require emergency medical care and may be life threatening.  CAUSES Almost all URIs are caused by viruses. A virus is a type of germ and can spread from one person to another.  RISKS FACTORS You may be at risk for a URI if:   You smoke.   You have chronic heart or lung disease.  You have a weakened defense (immune) system.   You are very young or very old.   You have nasal allergies or asthma.  You work in crowded or poorly ventilated areas.  You work in health care facilities or schools. SIGNS AND SYMPTOMS  Symptoms typically develop 2-3 days after you come in contact with a cold virus. Most viral URIs last 7-10 days. However, viral URIs from the influenza virus (flu virus) can last 14-18 days and are typically more severe. Symptoms may include:   Runny or stuffy (congested) nose.   Sneezing.   Cough.   Sore throat.   Headache.   Fatigue.   Fever.    Loss of appetite.   Pain in your forehead, behind your eyes, and over your cheekbones (sinus pain).  Muscle aches.  DIAGNOSIS  Your health care provider may diagnose a URI by:  Physical exam.  Tests to check that your symptoms are not due to another condition such as:  Strep throat.  Sinusitis.  Pneumonia.  Asthma. TREATMENT  A URI goes away on its own with time. It cannot be cured with medicines, but medicines may be prescribed or recommended to relieve symptoms. Medicines may help:  Reduce your fever.  Reduce your cough.  Relieve nasal congestion. HOME CARE INSTRUCTIONS   Take medicines only as directed by your health care provider.   Gargle warm saltwater or take cough drops to comfort your throat as directed by your health care provider.  Use a warm mist humidifier or inhale steam from a shower to increase air moisture. This may make it easier to breathe.  Drink enough fluid to keep your urine clear or pale yellow.   Eat soups and other clear broths and maintain good nutrition.   Rest as needed.   Return to work when your temperature has returned to normal or as your health care provider advises. You may need to stay home longer to avoid infecting others. You can also use a face mask and careful hand washing to prevent spread of the virus.  Increase the usage of your inhaler if you have  asthma.   Do not use any tobacco products, including cigarettes, chewing tobacco, or electronic cigarettes. If you need help quitting, ask your health care provider. PREVENTION  The best way to protect yourself from getting a cold is to practice good hygiene.   Avoid oral or hand contact with people with cold symptoms.   Wash your hands often if contact occurs.  There is no clear evidence that vitamin C, vitamin E, echinacea, or exercise reduces the chance of developing a cold. However, it is always recommended to get plenty of rest, exercise, and practice good  nutrition.  SEEK MEDICAL CARE IF:   You are getting worse rather than better.   Your symptoms are not controlled by medicine.   You have chills.  You have worsening shortness of breath.  You have brown or red mucus.  You have yellow or brown nasal discharge.  You have pain in your face, especially when you bend forward.  You have a fever.  You have swollen neck glands.  You have pain while swallowing.  You have white areas in the back of your throat. SEEK IMMEDIATE MEDICAL CARE IF:   You have severe or persistent:  Headache.  Ear pain.  Sinus pain.  Chest pain.  You have chronic lung disease and any of the following:  Wheezing.  Prolonged cough.  Coughing up blood.  A change in your usual mucus.  You have a stiff neck.  You have changes in your:  Vision.  Hearing.  Thinking.  Mood. MAKE SURE YOU:   Understand these instructions.  Will watch your condition.  Will get help right away if you are not doing well or get worse.   This information is not intended to replace advice given to you by your health care provider. Make sure you discuss any questions you have with your health care provider.   Document Released: 12/08/2000 Document Revised: 10/29/2014 Document Reviewed: 09/19/2013 Elsevier Interactive Patient Education [employer].

## 2015-08-22 NOTE — Progress Notes (Signed)
Pre visit review using our clinic review tool, if applicable. No additional management support is needed unless otherwise documented below in the visit note. 

## 2015-08-22 NOTE — Assessment & Plan Note (Signed)
Rx for tessalon perles and flonase. Advised it is okay for her to use the cough syrup sparingly. No indication for antibiotics or steroids today.

## 2015-08-23 ENCOUNTER — Encounter (HOSPITAL_COMMUNITY): Payer: Self-pay | Admitting: Emergency Medicine

## 2015-08-23 ENCOUNTER — Emergency Department (HOSPITAL_COMMUNITY): Payer: PPO

## 2015-08-23 ENCOUNTER — Observation Stay (HOSPITAL_COMMUNITY)
Admission: EM | Admit: 2015-08-23 | Discharge: 2015-08-24 | Disposition: A | Discharge status: Home / Self Care | Payer: PPO | Attending: Internal Medicine | Admitting: Internal Medicine

## 2015-08-23 DIAGNOSIS — R0602 Shortness of breath: Secondary | ICD-10-CM | POA: Diagnosis not present

## 2015-08-23 DIAGNOSIS — Z79899 Other long term (current) drug therapy: Secondary | ICD-10-CM | POA: Insufficient documentation

## 2015-08-23 DIAGNOSIS — J069 Acute upper respiratory infection, unspecified: Secondary | ICD-10-CM | POA: Insufficient documentation

## 2015-08-23 DIAGNOSIS — I209 Angina pectoris, unspecified: Principal | ICD-10-CM | POA: Insufficient documentation

## 2015-08-23 DIAGNOSIS — I10 Essential (primary) hypertension: Secondary | ICD-10-CM | POA: Diagnosis not present

## 2015-08-23 DIAGNOSIS — G8929 Other chronic pain: Secondary | ICD-10-CM | POA: Diagnosis not present

## 2015-08-23 DIAGNOSIS — E039 Hypothyroidism, unspecified: Secondary | ICD-10-CM | POA: Insufficient documentation

## 2015-08-23 DIAGNOSIS — R079 Chest pain, unspecified: Secondary | ICD-10-CM | POA: Diagnosis not present

## 2015-08-23 DIAGNOSIS — Z96651 Presence of right artificial knee joint: Secondary | ICD-10-CM | POA: Diagnosis not present

## 2015-08-23 DIAGNOSIS — Z7951 Long term (current) use of inhaled steroids: Secondary | ICD-10-CM | POA: Diagnosis not present

## 2015-08-23 DIAGNOSIS — E669 Obesity, unspecified: Secondary | ICD-10-CM | POA: Diagnosis not present

## 2015-08-23 DIAGNOSIS — E7800 Pure hypercholesterolemia, unspecified: Secondary | ICD-10-CM | POA: Insufficient documentation

## 2015-08-23 DIAGNOSIS — Z6835 Body mass index (BMI) 35.0-35.9, adult: Secondary | ICD-10-CM | POA: Diagnosis not present

## 2015-08-23 DIAGNOSIS — Z7982 Long term (current) use of aspirin: Secondary | ICD-10-CM | POA: Diagnosis not present

## 2015-08-23 DIAGNOSIS — Z8249 Family history of ischemic heart disease and other diseases of the circulatory system: Secondary | ICD-10-CM | POA: Insufficient documentation

## 2015-08-23 DIAGNOSIS — R0789 Other chest pain: Secondary | ICD-10-CM | POA: Diagnosis not present

## 2015-08-23 DIAGNOSIS — M549 Dorsalgia, unspecified: Secondary | ICD-10-CM | POA: Diagnosis not present

## 2015-08-23 LAB — CBC
HEMATOCRIT: 40.3 % (ref 36.0–46.0)
HEMOGLOBIN: 13.2 g/dL (ref 12.0–15.0)
MCH: 27.8 pg (ref 26.0–34.0)
MCHC: 32.8 g/dL (ref 30.0–36.0)
MCV: 85 fL (ref 78.0–100.0)
Platelets: 179 10*3/uL (ref 150–400)
RBC: 4.74 MIL/uL (ref 3.87–5.11)
RDW: 13.6 % (ref 11.5–15.5)
WBC: 4.9 10*3/uL (ref 4.0–10.5)

## 2015-08-23 LAB — URINALYSIS, ROUTINE W REFLEX MICROSCOPIC
Bilirubin Urine: NEGATIVE
GLUCOSE, UA: NEGATIVE mg/dL
HGB URINE DIPSTICK: NEGATIVE
Ketones, ur: 15 mg/dL — AB
Nitrite: NEGATIVE
Protein, ur: NEGATIVE mg/dL
SPECIFIC GRAVITY, URINE: 1.021 (ref 1.005–1.030)
pH: 7 (ref 5.0–8.0)

## 2015-08-23 LAB — BASIC METABOLIC PANEL
ANION GAP: 13 (ref 5–15)
BUN: 19 mg/dL (ref 6–20)
CALCIUM: 9.9 mg/dL (ref 8.9–10.3)
CO2: 23 mmol/L (ref 22–32)
Chloride: 104 mmol/L (ref 101–111)
Creatinine, Ser: 1.11 mg/dL — ABNORMAL HIGH (ref 0.44–1.00)
GFR, EST AFRICAN AMERICAN: 53 mL/min — AB (ref >60)
GFR, EST NON AFRICAN AMERICAN: 46 mL/min — AB (ref >60)
Glucose, Bld: 108 mg/dL — ABNORMAL HIGH (ref 65–99)
POTASSIUM: 4.5 mmol/L (ref 3.5–5.1)
SODIUM: 140 mmol/L (ref 135–145)

## 2015-08-23 LAB — URINE MICROSCOPIC-ADD ON

## 2015-08-23 LAB — I-STAT TROPONIN, ED: TROPONIN I, POC: 0 ng/mL (ref 0.00–0.08)

## 2015-08-23 LAB — TROPONIN I: Troponin I: <0.03 ng/mL (ref <0.031)

## 2015-08-23 MED ORDER — BENZONATATE 100 MG PO CAPS: 100.0000 mg | ORAL_CAPSULE | Freq: Two times a day (BID) | ORAL | Status: DC | PRN

## 2015-08-23 MED ORDER — FLUTICASONE PROPIONATE 50 MCG/ACT NA SUSP
2.0000 | Freq: Every day | NASAL | Status: DC
  Administered 2015-08-23: 2 via NASAL
  Filled 2015-08-23: qty 16

## 2015-08-23 MED ORDER — HYDROCHLOROTHIAZIDE 12.5 MG PO CAPS
12.5000 mg | ORAL_CAPSULE | Freq: Every day | ORAL | Status: DC
  Administered 2015-08-24: 12.5 mg via ORAL
  Filled 2015-08-23: qty 1

## 2015-08-23 MED ORDER — PRAVASTATIN SODIUM 40 MG PO TABS: 40.0000 mg | ORAL_TABLET | Freq: Every day | ORAL | Status: DC

## 2015-08-23 MED ORDER — ACETAMINOPHEN 325 MG PO TABS: 650.0000 mg | ORAL_TABLET | ORAL | Status: DC | PRN

## 2015-08-23 MED ORDER — MORPHINE SULFATE (PF) 2 MG/ML IV SOLN: 2.0000 mg | INTRAVENOUS | Status: DC | PRN

## 2015-08-23 MED ORDER — HYDROCOD POLST-CPM POLST ER 10-8 MG/5ML PO SUER
5.0000 mL | Freq: Two times a day (BID) | ORAL | Status: DC | PRN
Start: 2015-08-23 — End: 2015-08-24
  Administered 2015-08-23 – 2015-08-24 (×2): 5 mL via ORAL
  Filled 2015-08-23 (×2): qty 5

## 2015-08-23 MED ORDER — SODIUM CHLORIDE 0.9 % IV SOLN
INTRAVENOUS | Status: DC
Start: 2015-08-23 — End: 2015-08-24
  Administered 2015-08-23: 21:00:00 via INTRAVENOUS

## 2015-08-23 MED ORDER — HYDROCODONE-ACETAMINOPHEN 5-325 MG PO TABS
1.0000 | ORAL_TABLET | Freq: Once | ORAL | Status: AC
  Administered 2015-08-23: 1 via ORAL
  Filled 2015-08-23: qty 1

## 2015-08-23 MED ORDER — IRBESARTAN 300 MG PO TABS: 300.0000 mg | ORAL_TABLET | Freq: Every day | ORAL | Status: DC

## 2015-08-23 MED ORDER — ASPIRIN EC 81 MG PO TBEC
81.0000 mg | DELAYED_RELEASE_TABLET | Freq: Every day | ORAL | Status: DC
  Administered 2015-08-24: 81 mg via ORAL
  Filled 2015-08-23: qty 1

## 2015-08-23 MED ORDER — ONDANSETRON HCL 4 MG/2ML IJ SOLN: 4.0000 mg | Freq: Four times a day (QID) | INTRAMUSCULAR | Status: DC | PRN

## 2015-08-23 MED ORDER — LORATADINE 10 MG PO TABS
10.0000 mg | ORAL_TABLET | Freq: Every day | ORAL | Status: DC
  Administered 2015-08-24: 10 mg via ORAL
  Filled 2015-08-23: qty 1

## 2015-08-23 MED ORDER — IRBESARTAN-HYDROCHLOROTHIAZIDE 300-12.5 MG PO TABS: 1.0000 | ORAL_TABLET | Freq: Every day | ORAL | Status: DC

## 2015-08-23 MED ORDER — HYDROCODONE-ACETAMINOPHEN 5-325 MG PO TABS
1.0000 | ORAL_TABLET | Freq: Four times a day (QID) | ORAL | Status: DC | PRN
  Administered 2015-08-24 (×2): 1 via ORAL
  Filled 2015-08-23 (×2): qty 1

## 2015-08-23 NOTE — ED Notes (Signed)
Pt c/o excessive sweating yesterday that resolved but came back again this morning followed by left arm pain. Pt reports shortness of breath.

## 2015-08-23 NOTE — H&P (Signed)
[facility] History and Physical  Claudia [name] [id number] DOB: Feb 08, 1934 DOA: 08/23/2015  PCP: [name], MD   Chief Complaint: Chest pain  HPI: Claudia [name] is a 82 y.o. woman with a history of HTN and hypercholesterolemia who exercises three time per week at the [facility] who presents for evaluation of chest pain.  The patient saw her primary care provider yesterday morning for symptoms of an URI.  Upon returning home, she had the acute onset of significant diaphoresis and a feeling of extreme fatigue/exhaustion, which is atypical for her.  The symptoms lasted about 20 minutes.  She rested for about an hour and felt fine for the rest of the day.  This morning, she developed nonexertional left-sided chest pressure that radiated to her left arm and was associated with shortness of breath, diaphoresis, and dizziness.  Symptoms lasted for almost 30 minutes and resolved without specific intervention (she had already taken her baby aspirin for the day).  The symptoms were concerning for her and her husband, so she presented to the ED today by private car for further evaluation.  She has been chest pain free since presentation to the ED.  She reports that her 16yo sister was diagnosed with heart disease and had stents placed within the past few months.  Her father died of complications related to a massive MI at the age of 80.  Review of Systems: No fever.  No recent antibiotics.  No syncope or loss of consciousness.  No abdominal pain.  No dysuria.  Peripheral neuropathyt symptoms for the past six months; she is not a diabetic.  Intermittent ankle edema.  Past Medical History  Diagnosis Date  . Osteoarthritis   . Pure hypercholesterolemia   . Essential hypertension, benign   . Gouty arthropathy   . Lumbago   . Degeneration of lumbar or lumbosacral intervertebral disc   . Brachial neuritis or radiculitis NOS   . Unspecified hypothyroidism   . Obesity   . Abnormality of gait     Past Surgical History  Procedure Laterality Date  . Right knee replacement  03/13/2010  . Lumbar laminectomy  03/13/2010  . Abdominal hysterectomy  03/13/2010  . Back surgery    She has had two back surgeries; done at [facility].  Social History:  Social History   Social History Narrative  She is married.  She has one daughter, one grandchild, and two great grandchildren.  She exercised for approximately one hour, three times per week.   No tobacco, EtOH, or illicit drug use.  Allergies  Allergen Reactions  . Lipitor [Atorvastatin]     Muscle aches  . Trileptal [Oxcarbazepine]     confusion   Family History  Problem Relation Age of Onset  . Heart disease Father   . Hypertension Mother   . Alzheimer's disease Mother   . Cancer Neg Hx   . Kidney disease Neg Hx   . Diabetes Sister   . Cirrhosis Brother   Sister recently diagnosed with CAD  Prior to Admission medications   Medication Sig Start Date End Date Taking? Authorizing Provider  aspirin 81 MG tablet Take 81 mg by mouth daily.   Yes Historical Provider, MD  cetirizine (ZYRTEC) 10 MG tablet Take 10 mg by mouth daily.   Yes Historical Provider, MD  chlorpheniramine-HYDROcodone (TUSSIONEX PENNKINETIC ER) 10-8 MG/5ML SUER Take 5 mLs by mouth 2 (two) times daily as needed for cough.   Yes Historical Provider, MD  fluticasone (FLONASE) 50 MCG/ACT nasal  spray Place 2 sprays into both nostrils daily. 08/22/15  Yes Hoyt Koch, MD  HYDROcodone-acetaminophen (NORCO/VICODIN) 5-325 MG tablet Take 1 tablet by mouth every 6 (six) hours as needed for moderate pain. 04/14/15  Yes Janith Lima, MD  irbesartan-hydrochlorothiazide (AVALIDE) 300-12.5 MG per tablet Take 1 tablet by mouth daily. 07/09/14  Yes Janith Lima, MD  Linaclotide [facility]) 290 MCG CAPS capsule Take 1 capsule (290 mcg total) by mouth daily. Patient taking differently: Take 290 mcg by mouth every other day.  04/14/15  Yes Janith Lima, MD  MEGARED  OMEGA-3 KRILL OIL 500 MG CAPS Take 500 mg by mouth daily.   Yes Historical Provider, MD  Multiple Vitamins-Minerals (ULTRA WOMENS PACK) MISC Take 1 tablet by mouth daily.   Yes Historical Provider, MD  Pitavastatin Calcium (LIVALO) 2 MG TABS Take 1 tablet (2 mg total) by mouth daily. 07/09/14  Yes Janith Lima, MD  vitamin C (ASCORBIC ACID) 500 MG tablet Take 500 mg by mouth daily.   Yes Historical Provider, MD  vitamin E 400 UNIT capsule Take 400 Units by mouth daily.   Yes Historical Provider, MD  benzonatate (TESSALON) 100 MG capsule Take 1 capsule (100 mg total) by mouth 2 (two) times daily as needed for cough. 08/22/15   Hoyt Koch, MD   Physical Exam: Filed Vitals:   08/23/15 1830 08/23/15 1845 08/23/15 1915 08/23/15 1930  BP: 142/75 138/70 164/64 152/78  Pulse: 52 51 62 54  Temp:      TempSrc:      Resp: 19 19 17 17   Height:      Weight:      SpO2: 99% 99% 90% 95%    General:  Awake and alert.  Oriented to person, place, time and situation.  NAD. Eyes: PERRL bilaterally, conjunctiva are pink.  EOMI. ENT: Moist mucous membranes.  No nasal drainage.  Posterior oropharynx is erythematous. Neck: Supple.  Cardiovascular: NR/RR.  No significant pitting edema in her legs/ankles. Respiratory: CTA bilaterally. Abdomen: Soft/NT/ND.  Bowel sounds are present.  No guarding. Skin: Warm and dry. Musculoskeletal: Moves all four extremities spontaneously. Psychiatric: Normal affect. Neurologic: No focal deficits.  Labs on Admission:  Basic Metabolic Panel:  Recent Labs Lab 08/23/15 1602  NA 140  K 4.5  CL 104  CO2 23  GLUCOSE 108*  BUN 19  CREATININE 1.11*  CALCIUM 9.9   CBC:  Recent Labs Lab 08/23/15 1602  WBC 4.9  HGB 13.2  HCT 40.3  MCV 85.0  PLT 179   Cardiac Enzymes: I-stat troponin negative in the ED  Radiological Exams on Admission: Dg Chest 2 View  08/23/2015  CLINICAL DATA:  Left arm pain and chest pressure today. EXAM: CHEST  2 VIEW  COMPARISON:  06/24/2014 FINDINGS: Heart and mediastinal contours are within normal limits. No focal opacities or effusions. No acute bony abnormality. IMPRESSION: No active cardiopulmonary disease. Electronically Signed   By: Rolm Baptise M.D.   On: 08/23/2015 17:03    EKG: Independently reviewed. NSR.  No acute ST segment changes.  Assessment/Plan Principal Problem:   Angina pectoris ([place]) Active Problems:   Pure hypercholesterolemia   Essential hypertension, benign   Chest pain   Admit to cardiac telemetry  Chest pain concerning for angina with multiple risk factors (Heart Score of at least 5) --Serial troponin --EKG prn for active chest pain --Continue baby aspirin, ARB, statin for now --A1c, fasting lipid panel for further risk factor stratification --Complete echo in the  AM --Anticipate she will be a candidate for stress test tomorrow  URI --Supportive care  Chronic back pain --Hydrocodone prn  Code Status: FULL  Family Communication: Husband, Haywood, at bedside Disposition Plan: Observation status, possible discharge tomorrow unless she has a positive stress test  Time spent: 60 minutes  [employer] [facility]  08/23/2015, 7:49 PM

## 2015-08-24 ENCOUNTER — Observation Stay (HOSPITAL_COMMUNITY): Payer: PPO

## 2015-08-24 DIAGNOSIS — R0602 Shortness of breath: Secondary | ICD-10-CM | POA: Diagnosis not present

## 2015-08-24 DIAGNOSIS — R079 Chest pain, unspecified: Secondary | ICD-10-CM | POA: Diagnosis not present

## 2015-08-24 DIAGNOSIS — I10 Essential (primary) hypertension: Secondary | ICD-10-CM | POA: Diagnosis not present

## 2015-08-24 DIAGNOSIS — I209 Angina pectoris, unspecified: Secondary | ICD-10-CM | POA: Diagnosis not present

## 2015-08-24 DIAGNOSIS — E7800 Pure hypercholesterolemia, unspecified: Secondary | ICD-10-CM | POA: Diagnosis not present

## 2015-08-24 LAB — NM MYOCAR MULTI W/SPECT W/WALL MOTION / EF
CSEPED: 0 min
CSEPPHR: 71 {beats}/min
Estimated workload: 1 METS
Exercise duration (sec): 0 s
MPHR: 141 {beats}/min
Percent HR: 50 %
Rest HR: 50 {beats}/min

## 2015-08-24 LAB — BASIC METABOLIC PANEL
Anion gap: 10 (ref 5–15)
BUN: 18 mg/dL (ref 6–20)
CALCIUM: 9.3 mg/dL (ref 8.9–10.3)
CO2: 25 mmol/L (ref 22–32)
CREATININE: 1 mg/dL (ref 0.44–1.00)
Chloride: 106 mmol/L (ref 101–111)
GFR, EST NON AFRICAN AMERICAN: 52 mL/min — AB (ref >60)
Glucose, Bld: 100 mg/dL — ABNORMAL HIGH (ref 65–99)
Potassium: 4.6 mmol/L (ref 3.5–5.1)
SODIUM: 141 mmol/L (ref 135–145)

## 2015-08-24 LAB — TROPONIN I

## 2015-08-24 LAB — LIPID PANEL
CHOLESTEROL: 148 mg/dL (ref 0–200)
HDL: 51 mg/dL (ref >40)
LDL CALC: 86 mg/dL (ref 0–99)
TRIGLYCERIDES: 53 mg/dL (ref <150)
Total CHOL/HDL Ratio: 2.9 RATIO
VLDL: 11 mg/dL (ref 0–40)

## 2015-08-24 MED ORDER — TECHNETIUM TC 99M SESTAMIBI GENERIC - CARDIOLITE
10.0000 | Freq: Once | INTRAVENOUS | Status: AC | PRN
  Administered 2015-08-24: 10 via INTRAVENOUS

## 2015-08-24 MED ORDER — REGADENOSON 0.4 MG/5ML IV SOLN
0.4000 mg | Freq: Once | INTRAVENOUS | Status: AC
  Administered 2015-08-24: 0.4 mg via INTRAVENOUS
  Filled 2015-08-24: qty 5

## 2015-08-24 MED ORDER — REGADENOSON 0.4 MG/5ML IV SOLN
INTRAVENOUS | Status: AC
  Administered 2015-08-24: 0.4 mg via INTRAVENOUS
  Filled 2015-08-24: qty 5

## 2015-08-24 MED ORDER — REGADENOSON 0.4 MG/5ML IV SOLN
0.4000 mg | Freq: Once | INTRAVENOUS | Status: DC
  Filled 2015-08-24: qty 5

## 2015-08-24 MED ORDER — TECHNETIUM TC 99M SESTAMIBI GENERIC - CARDIOLITE
30.0000 | Freq: Once | INTRAVENOUS | Status: AC | PRN
  Administered 2015-08-24: 30 via INTRAVENOUS

## 2015-08-24 NOTE — Discharge Instructions (Signed)
Follow with Primary MD Thomas Jones, MD in 7 days  ° °Get CBC, CMP, 2 view Chest X ray checked  by Primary MD next visit.  ° ° °Activity: As tolerated with Full fall precautions use walker/cane & assistance as needed ° ° °Disposition Home  ° ° °Diet: Heart Healthy  ° °For Heart failure patients - Check your Weight same time everyday, if you gain over 2 pounds, or you develop in leg swelling, experience more shortness of breath or chest pain, call your Primary MD immediately. Follow Cardiac Low Salt Diet and 1.5 lit/day fluid restriction. ° ° °On your next visit with your primary care physician please Get Medicines reviewed and adjusted. ° ° °Please request your Prim.MD to go over all Hospital Tests and Procedure/Radiological results at the follow up, please get all Hospital records sent to your Prim MD by signing hospital release before you go home. ° ° °If you experience worsening of your admission symptoms, develop shortness of breath, life threatening emergency, suicidal or homicidal thoughts you must seek medical attention immediately by calling 911 or calling your MD immediately  if symptoms less severe. ° °You Must read complete instructions/literature along with all the possible adverse reactions/side effects for all the Medicines you take and that have been prescribed to you. Take any new Medicines after you have completely understood and accpet all the possible adverse reactions/side effects.  ° °Do not drive, operating heavy machinery, perform activities at heights, swimming or participation in water activities or provide baby sitting services if your were admitted for syncope or siezures until you have seen by Primary MD or a Neurologist and advised to do so again. ° °Do not drive when taking Pain medications.  ° ° °Do not take more than prescribed Pain, Sleep and Anxiety Medications ° °Special Instructions: If you have smoked or chewed Tobacco  in the last 2 yrs please stop smoking, stop any regular  Alcohol  and or any Recreational drug use. ° °Wear Seat belts while driving. ° ° °Please note ° °You were cared for by a hospitalist during your hospital stay. If you have any questions about your discharge medications or the care you received while you were in the hospital after you are discharged, you can call the unit and asked to speak with the hospitalist on call if the hospitalist that took care of you is not available. Once you are discharged, your primary care physician will handle any further medical issues. Please note that NO REFILLS for any discharge medications will be authorized once you are discharged, as it is imperative that you return to your primary care physician (or establish a relationship with a primary care physician if you do not have one) for your aftercare needs so that they can reassess your need for medications and monitor your lab values. ° °

## 2015-08-24 NOTE — Discharge Summary (Signed)
Claudia [name], is a 82 y.o. female  DOB March 02, 1934  MRN [id number].  Admission date:  08/23/2015  Admitting Physician  Lily Kocher, MD  Discharge Date:  08/24/2015   Primary MD  [name], MD  Recommendations for primary care physician for things to follow:   Monitor secondary risk factors for CAD   Admission Diagnosis  Angina pectoris [facility]) [I20.9]   Discharge Diagnosis  Angina pectoris ([place]) [I20.9]     Principal Problem:   Angina pectoris ([place]) Active Problems:   Pure hypercholesterolemia   Essential hypertension, benign   Chest pain      Past Medical History  Diagnosis Date  . Osteoarthritis   . Pure hypercholesterolemia   . Essential hypertension, benign   . Gouty arthropathy   . Lumbago   . Degeneration of lumbar or lumbosacral intervertebral disc   . Brachial neuritis or radiculitis NOS   . Unspecified hypothyroidism   . Obesity   . Abnormality of gait     Past Surgical History  Procedure Laterality Date  . Right knee replacement  03/13/2010  . Lumbar laminectomy  03/13/2010  . Abdominal hysterectomy  03/13/2010  . Back surgery         HPI  from the history and physical done on the day of admission:    Claudia [name] is a 82 y.o. woman with a history of HTN and hypercholesterolemia who exercises three time per week at the [facility] who presents for evaluation of chest pain. The patient saw her primary care provider yesterday morning for symptoms of an URI. Upon returning home, she had the acute onset of significant diaphoresis and a feeling of extreme fatigue/exhaustion, which is atypical for her. The symptoms lasted about 20 minutes. She rested for about an hour and felt fine for the rest of the day. This morning, she developed nonexertional left-sided chest pressure that  radiated to her left arm and was associated with shortness of breath, diaphoresis, and dizziness. Symptoms lasted for almost 30 minutes and resolved without specific intervention (she had already taken her baby aspirin for the day). The symptoms were concerning for her and her husband, so she presented to the ED today by private car for further evaluation. She has been chest pain free since presentation to the ED.  She reports that her 70yo sister was diagnosed with heart disease and had stents placed within the past few months. Her father died of complications related to a massive MI at the age of 53.     Hospital Course:     Chest pain  Atypical ruled out for MI, EKG non acute now symptom free, Lexiscan negative, follow with PCP for secondary CAD risk factor modulation.    URI -Supportive care   HTN & Chronic back pain -continue Home Rx   Dyslipidemia - on statin.      Discharge Condition: Stable  Follow UP  Follow-up Information    Follow up with [name], MD. Schedule an appointment as soon as possible for a  visit in 1 week.   Specialty:  Internal Medicine   Contact information:   [address] [address] [phone]       Follow up with Nahser, Wonda Cheng, MD. Schedule an appointment as soon as possible for a visit in 1 week.   Specialty:  Cardiology   Contact information:   [address] [place] [address] [phone]        Consults obtained - Cards (phone)   Diet and Activity recommendation: See Discharge Instructions below  Discharge Instructions           Discharge Instructions    Discharge instructions    Complete by:  As directed   Follow with Primary MD [name], MD in 7 days   Get CBC, CMP, 2 view Chest X ray checked  by Primary MD next visit.    Activity: As tolerated with Full fall precautions use walker/cane & assistance as needed   Disposition Home    Diet:   Heart Healthy     For Heart failure patients - Check your Weight same time everyday, if you gain over 2 pounds, or you develop in leg swelling, experience more shortness of breath or chest pain, call your Primary MD immediately. Follow Cardiac Low Salt Diet and 1.5 lit/day fluid restriction.   On your next visit with your primary care physician please Get Medicines reviewed and adjusted.   Please request your Prim.MD to go over all Hospital Tests and Procedure/Radiological results at the follow up, please get all Hospital records sent to your Prim MD by signing hospital release before you go home.   If you experience worsening of your admission symptoms, develop shortness of breath, life threatening emergency, suicidal or homicidal thoughts you must seek medical attention immediately by calling 911 or calling your MD immediately  if symptoms less severe.  You Must read complete instructions/literature along with all the possible adverse reactions/side effects for all the Medicines you take and that have been prescribed to you. Take any new Medicines after you have completely understood and accpet all the possible adverse reactions/side effects.   Do not drive, operating heavy machinery, perform activities at heights, swimming or participation in water activities or provide baby sitting services if your were admitted for syncope or siezures until you have seen by Primary MD or a Neurologist and advised to do so again.  Do not drive when taking Pain medications.    Do not take more than prescribed Pain, Sleep and Anxiety Medications  Special Instructions: If you have smoked or chewed Tobacco  in the last 2 yrs please stop smoking, stop any regular Alcohol  and or any Recreational drug use.  Wear Seat belts while driving.   Please note  You were cared for by a hospitalist during your hospital stay. If you have any questions about your discharge medications or the care you received while you were in the  hospital after you are discharged, you can call the unit and asked to speak with the hospitalist on call if the hospitalist that took care of you is not available. Once you are discharged, your primary care physician will handle any further medical issues. Please note that NO REFILLS for any discharge medications will be authorized once you are discharged, as it is imperative that you return to your primary care physician (or establish a relationship with a primary care physician if you do not have one) for your aftercare needs  so that they can reassess your need for medications and monitor your lab values.     Increase activity slowly    Complete by:  As directed              Discharge Medications       Medication List    TAKE these medications        aspirin 81 MG tablet  Take 81 mg by mouth daily.     benzonatate 100 MG capsule  Commonly known as:  TESSALON  Take 1 capsule (100 mg total) by mouth 2 (two) times daily as needed for cough.     cetirizine 10 MG tablet  Commonly known as:  ZYRTEC  Take 10 mg by mouth daily.     fluticasone 50 MCG/ACT nasal spray  Commonly known as:  FLONASE  Place 2 sprays into both nostrils daily.     HYDROcodone-acetaminophen 5-325 MG tablet  Commonly known as:  NORCO/VICODIN  Take 1 tablet by mouth every 6 (six) hours as needed for moderate pain.     irbesartan-hydrochlorothiazide 300-12.5 MG tablet  Commonly known as:  AVALIDE  Take 1 tablet by mouth daily.     Linaclotide 290 MCG Caps capsule  Commonly known as:  LINZESS  Take 1 capsule (290 mcg total) by mouth daily.     MEGARED OMEGA-3 KRILL OIL 500 MG Caps  Take 500 mg by mouth daily.     Pitavastatin Calcium 2 MG Tabs  Commonly known as:  LIVALO  Take 1 tablet (2 mg total) by mouth daily.     TUSSIONEX PENNKINETIC ER 10-8 MG/5ML Suer  Generic drug:  chlorpheniramine-HYDROcodone  Take 5 mLs by mouth 2 (two) times daily as needed for cough.     ULTRA WOMENS PACK Misc  Take  1 tablet by mouth daily.     vitamin C 500 MG tablet  Commonly known as:  ASCORBIC ACID  Take 500 mg by mouth daily.     vitamin E 400 UNIT capsule  Take 400 Units by mouth daily.        Major procedures and Radiology Reports - PLEASE review detailed and final reports for all details, in brief -       Dg Chest 2 View  08/23/2015  CLINICAL DATA:  Left arm pain and chest pressure today. EXAM: CHEST  2 VIEW COMPARISON:  06/24/2014 FINDINGS: Heart and mediastinal contours are within normal limits. No focal opacities or effusions. No acute bony abnormality. IMPRESSION: No active cardiopulmonary disease. Electronically Signed   By: Rolm Baptise M.D.   On: 08/23/2015 17:03   Nm Myocar Multi W/spect W/wall Motion / Ef  08/24/2015  CLINICAL DATA:  82 year old with risk factors including hypertension, hypercholesterolemia and family history of coronary artery disease in her father and a sister, admitted to the hospital yesterday after an episode of left-sided chest pain radiating into the left arm associated with shortness of breath, diaphoresis and dizziness. EXAM: MYOCARDIAL IMAGING WITH SPECT (REST AND PHARMACOLOGIC-STRESS) GATED LEFT VENTRICULAR WALL MOTION STUDY LEFT VENTRICULAR EJECTION FRACTION TECHNIQUE: Standard myocardial SPECT imaging was performed after resting intravenous injection of 10 mCi Tc-46m sestamibi. Subsequently, intravenous infusion of Lexiscan was performed under the supervision of the Cardiology staff. At peak effect of the drug, 30 mCi Tc-58m sestamibi was injected intravenously and standard myocardial SPECT imaging was performed. Quantitative gated imaging was also performed to evaluate left ventricular wall motion, and estimate left ventricular ejection fraction. COMPARISON:  None. FINDINGS: Perfusion: Artifactual focal  hot spot adjacent to the inferior wall on the post regadenoson images related to subdiaphragmatic activity. Slight diminished activity in the mid and distal  inferior wall without evidence of reversibility which can be explained on the basis of diaphragmatic attenuation, given her elevated left hemidiaphragm on chest x-ray. Wall Motion: Normal left ventricular wall motion. No left ventricular dilation. Left Ventricular Ejection Fraction: 0000000 End diastolic volume 93 ml End systolic volume 30 ml IMPRESSION: 1. No reversible ischemia or infarction. 2. Normal left ventricular wall motion. 3. Left ventricular ejection fraction 68% 4. Low-risk stress test findings*. *2012 Appropriate Use Criteria for Coronary Revascularization Focused Update: J Am Coll Cardiol. [id number]. http://content.airportbarriers.com.aspx?articleid=[email or web address] Electronically Signed   By: Evangeline Dakin M.D.   On: 08/24/2015 15:41    Micro Results      No results found for this or any previous visit (from the past 240 hour(s)).     Today   Subjective    Claudia [name] today has no headache,no chest abdominal pain,no new weakness tingling or numbness, feels much better wants to go home today.     Objective   Blood pressure 134/57, pulse 56, temperature 97.4 F (36.3 C), temperature source Oral, resp. rate 18, height 5\' 10"  (1.778 m), weight 112.038 kg (247 lb), SpO2 99 %.   Intake/Output Summary (Last 24 hours) at 08/24/15 1634 Last data filed at 08/24/15 0800  Gross per 24 hour  Intake  182.5 ml  Output    750 ml  Net -567.5 ml    Exam Awake Alert, Oriented x 3, No new F.N deficits, Normal affect [place].AT,PERRAL Supple Neck,No JVD, No cervical lymphadenopathy appriciated.  Symmetrical Chest wall movement, Good air movement bilaterally, CTAB RRR,No Gallops,Rubs or new Murmurs, No Parasternal Heave +ve B.Sounds, Abd Soft, Non tender, No organomegaly appriciated, No rebound -guarding or rigidity. No Cyanosis, Clubbing or edema, No new Rash or bruise   Data Review   CBC w Diff:  Lab Results  Component Value Date   WBC 4.9 08/23/2015   WBC 4.8 08/10/2011    HGB 13.2 08/23/2015   HCT 40.3 08/23/2015   PLT 179 08/23/2015   LYMPHOPCT 38.4 04/14/2015   MONOPCT 7.7 04/14/2015   EOSPCT 7.3* 04/14/2015   EOSPCT 1 08/10/2011   BASOPCT 0.8 04/14/2015    CMP:  Lab Results  Component Value Date   NA 141 08/24/2015   NA 140 08/10/2011   K 4.6 08/24/2015   CL 106 08/24/2015   CL 106 07/30/2010   CO2 25 08/24/2015   BUN 18 08/24/2015   BUN 22* 08/10/2011   CREATININE 1.00 08/24/2015   CREATININE 0.9 08/10/2011   GLU 81 08/10/2011   PROT 6.8 04/14/2015   PROT 6.2 07/30/2010   ALBUMIN 3.9 04/14/2015   ALBUMIN 4.0 08/10/2011   BILITOT 0.8 04/14/2015   ALKPHOS 75 04/14/2015   AST 19 04/14/2015   ALT 13 04/14/2015  .   Total Time in preparing paper work, data evaluation and todays exam - 35 minutes  Thurnell Lose M.D on 08/24/2015 at 4:34 PM  [facility]   Office  [phone]

## 2015-08-24 NOTE — Progress Notes (Signed)
1 min, [place] PA at bedside. Pt reports some SOB, denies chest pain.

## 2015-08-24 NOTE — Progress Notes (Signed)
3 min, [place] PA at bedside, pt states her Claudia [name] is "getting better." Pt continues to deny CP

## 2015-08-24 NOTE — Progress Notes (Signed)
  Seen in Nuclear Medicine for 1-day NST. Official Read pending by [facility] Radiology following stress images.  Signed, Erma Heritage, PA-C 08/24/2015, 12:25 PM Pager: [phone]

## 2015-08-24 NOTE — Progress Notes (Signed)
Pre lexi scan  

## 2015-08-24 NOTE — Progress Notes (Signed)
5 mins, pt reports SOB has subsided, continues to deny CP. [place] at bedside.  Lexi scan completed

## 2015-08-24 NOTE — Progress Notes (Signed)
Discharged to home with family office visits in place teaching done  

## 2015-08-25 ENCOUNTER — Other Ambulatory Visit: Payer: Self-pay

## 2015-08-25 DIAGNOSIS — E7800 Pure hypercholesterolemia, unspecified: Secondary | ICD-10-CM

## 2015-08-25 LAB — HEMOGLOBIN A1C
Hgb A1c MFr Bld: 5.8 % — ABNORMAL HIGH (ref 4.8–5.6)
Mean Plasma Glucose: 120 mg/dL

## 2015-08-25 MED ORDER — PITAVASTATIN CALCIUM 2 MG PO TABS: 1.0000 | ORAL_TABLET | Freq: Every day | ORAL | Status: DC

## 2015-08-26 ENCOUNTER — Telehealth: Payer: Self-pay

## 2015-08-26 NOTE — Telephone Encounter (Signed)
Transition Care Management Follow-up Telephone Call DX: Angina Pectoris   Date discharged? 08/24/2015   How have you been since you were released from the hospital? Patient is feeling better but still very weak.    Do you understand why you were in the hospital? Yes   Do you understand the discharge instructions? Yes   Where were you discharged to? HOME   Items Reviewed:  Medications reviewed: Yes. There were no changes  Allergies reviewed: Yes. There were no changes  Dietary changes reviewed: Yes. Heart healthy diet  Referrals reviewed: None other than Cardiology possibly.    Functional Questionnaire:   Activities of Daily Living (ADLs):   States they are independent in the following: Patient stated that she is independent in all ADL's States they require assistance with the following: None   Any transportation issues/concerns?: No concerns with transportation   Any patient concerns? Patient does not have any concerns at this time other than fatigue.    Confirmed importance and date/time of follow-up visits scheduled Yes  Provider Appointment booked with PCP on 09/02/2015 at 09:00 am.   Confirmed with patient if condition begins to worsen call PCP or go to the ER.  Patient was given the office number and encouraged to call back with question or concerns: Yes and pt stated understanding.

## 2015-09-02 ENCOUNTER — Ambulatory Visit (INDEPENDENT_AMBULATORY_CARE_PROVIDER_SITE_OTHER)
Admission: RE | Admit: 2015-09-02 | Discharge: 2015-09-02 | Disposition: A | Discharge status: Home / Self Care | Payer: PPO | Source: Ambulatory Visit | Attending: Internal Medicine | Admitting: Internal Medicine

## 2015-09-02 ENCOUNTER — Ambulatory Visit (INDEPENDENT_AMBULATORY_CARE_PROVIDER_SITE_OTHER): Payer: PPO | Admitting: Internal Medicine

## 2015-09-02 ENCOUNTER — Encounter: Payer: Self-pay | Admitting: Internal Medicine

## 2015-09-02 VITALS — BP 140/80 | HR 53 | Temp 98.1°F | Resp 16 | Ht 70.0 in | Wt 246.0 lb

## 2015-09-02 DIAGNOSIS — Z0000 Encounter for general adult medical examination without abnormal findings: Secondary | ICD-10-CM

## 2015-09-02 DIAGNOSIS — R05 Cough: Secondary | ICD-10-CM | POA: Diagnosis not present

## 2015-09-02 DIAGNOSIS — Z1231 Encounter for screening mammogram for malignant neoplasm of breast: Secondary | ICD-10-CM

## 2015-09-02 DIAGNOSIS — R059 Cough, unspecified: Secondary | ICD-10-CM

## 2015-09-02 DIAGNOSIS — Z23 Encounter for immunization: Secondary | ICD-10-CM | POA: Diagnosis not present

## 2015-09-02 DIAGNOSIS — I10 Essential (primary) hypertension: Secondary | ICD-10-CM

## 2015-09-02 DIAGNOSIS — M961 Postlaminectomy syndrome, not elsewhere classified: Secondary | ICD-10-CM

## 2015-09-02 DIAGNOSIS — M170 Bilateral primary osteoarthritis of knee: Secondary | ICD-10-CM | POA: Diagnosis not present

## 2015-09-02 MED ORDER — HYDROCODONE-ACETAMINOPHEN 5-325 MG PO TABS: 1.0000 | ORAL_TABLET | Freq: Four times a day (QID) | ORAL | Status: DC | PRN

## 2015-09-02 MED ORDER — HYDROCODONE-ACETAMINOPHEN 5-325 MG PO TABS
1.0000 | ORAL_TABLET | Freq: Four times a day (QID) | ORAL | Status: DC | PRN
Start: 2015-09-02 — End: 2015-12-04

## 2015-09-02 MED ORDER — IRBESARTAN-HYDROCHLOROTHIAZIDE 300-12.5 MG PO TABS: 1.0000 | ORAL_TABLET | Freq: Every day | ORAL | Status: DC

## 2015-09-02 NOTE — Progress Notes (Signed)
Pre visit review using our clinic review tool, if applicable. No additional management support is needed unless otherwise documented below in the visit note. 

## 2015-09-02 NOTE — Progress Notes (Signed)
Subjective:  Patient ID: Claudia [name], female    DOB: 1933-11-15  Age: 82 y.o. MRN: [id number]  CC: Cough; Hypertension; Annual Exam; and Osteoarthritis   HPI MCKENNA DELAPLANE presents for a complete physical, follow-up from recent hospitalization for chest pain, and recent development of a cough.  She was discharged from the hospital about a week ago after being evaluated for chest pain. She had a negative thallium scan. The chest pain has resolved. The chest pain was it attributed to angina pectoris and it was recommended that she pursue risk factor modification. She has been exercising at the gym over the last week and denies any dyspnea on exertion, fatigue, or palpitations.  After discharge from the hospital she developed a cough that was productive of yellow phlegm. The cough is quickly resolving. She denies shortness of breath, fever, chills, night sweats, sore throat, facial pain, or dizziness.  She has chronic joint pain and low back pain and requests a refill of Norco.  Outpatient Prescriptions Prior to Visit  Medication Sig Dispense Refill  . aspirin 81 MG tablet Take 81 mg by mouth daily.    . cetirizine (ZYRTEC) 10 MG tablet Take 10 mg by mouth daily.    . fluticasone (FLONASE) 50 MCG/ACT nasal spray Place 2 sprays into both nostrils daily. 16 g 6  . Linaclotide (LINZESS) 290 MCG CAPS capsule Take 1 capsule (290 mcg total) by mouth daily. (Patient taking differently: Take 290 mcg by mouth every other day. ) 30 capsule 11  . MEGARED OMEGA-3 KRILL OIL 500 MG CAPS Take 500 mg by mouth daily.    . Multiple Vitamins-Minerals (ULTRA WOMENS PACK) MISC Take 1 tablet by mouth daily.    . Pitavastatin Calcium (LIVALO) 2 MG TABS Take 1 tablet (2 mg total) by mouth daily. 90 tablet 3  . vitamin C (ASCORBIC ACID) 500 MG tablet Take 500 mg by mouth daily.    . vitamin E 400 UNIT capsule Take 400 Units by mouth daily.    . chlorpheniramine-HYDROcodone (TUSSIONEX PENNKINETIC ER) 10-8  MG/5ML SUER Take 5 mLs by mouth 2 (two) times daily as needed for cough.    [name] HYDROcodone-acetaminophen (NORCO/VICODIN) 5-325 MG tablet Take 1 tablet by mouth every 6 (six) hours as needed for moderate pain. 90 tablet 0  . irbesartan-hydrochlorothiazide (AVALIDE) 300-12.5 MG per tablet Take 1 tablet by mouth daily. 90 tablet 3  . benzonatate (TESSALON) 100 MG capsule Take 1 capsule (100 mg total) by mouth 2 (two) times daily as needed for cough. 60 capsule 0   No facility-administered medications prior to visit.    ROS Review of Systems  Constitutional: Negative.  Negative for fever, chills, diaphoresis, appetite change and fatigue.  HENT: Negative.  Negative for congestion, rhinorrhea, sinus pressure, sneezing, sore throat, trouble swallowing and voice change.   Eyes: Negative.   Respiratory: Positive for cough. Negative for apnea, choking, chest tightness, shortness of breath and wheezing.   Cardiovascular: Negative.  Negative for chest pain, palpitations and leg swelling.  Gastrointestinal: Negative.  Negative for nausea, vomiting, abdominal pain, diarrhea, constipation and blood in stool.  Endocrine: Negative.   Genitourinary: Negative.  Negative for dysuria, urgency, frequency, flank pain, decreased urine volume and difficulty urinating.  Musculoskeletal: Positive for back pain and arthralgias. Negative for myalgias, joint swelling and neck pain.  Skin: Negative.  Negative for color change and rash.  Allergic/Immunologic: Negative.   Neurological: Negative.  Negative for dizziness, weakness, light-headedness, numbness and headaches.  Hematological: Negative.  Negative for adenopathy. Does not bruise/bleed easily.  Psychiatric/Behavioral: Negative.     Objective:  BP 140/80 mmHg  Pulse 53  Temp(Src) 98.1 F (36.7 C) (Oral)  Resp 16  Ht 5\' 10"  (1.778 m)  Wt 246 lb (111.585 kg)  BMI 35.30 kg/m2  SpO2 96%  BP Readings from Last 3 Encounters:  09/02/15 140/80  08/24/15 134/57    08/22/15 130/80    Wt Readings from Last 3 Encounters:  09/02/15 246 lb (111.585 kg)  08/23/15 247 lb (112.038 kg)  08/22/15 251 lb (113.853 kg)    Physical Exam  Constitutional: She is oriented to person, place, and time. She appears well-developed and well-nourished. No distress.  HENT:  Head: Normocephalic and atraumatic.  Mouth/Throat: Oropharynx is clear and moist. No oropharyngeal exudate.  Eyes: Conjunctivae are normal. Right eye exhibits no discharge. Left eye exhibits no discharge. No scleral icterus.  Neck: Normal range of motion. Neck supple. No JVD present. No tracheal deviation present. No thyromegaly present.  Cardiovascular: Normal rate, regular rhythm, normal heart sounds and intact distal pulses.  Exam reveals no gallop and no friction rub.   No murmur heard. Pulmonary/Chest: Effort normal and breath sounds normal. No stridor. No respiratory distress. She has no wheezes. She has no rales. She exhibits no tenderness.  Abdominal: Soft. She exhibits no distension and no mass. There is no tenderness. There is no rebound and no guarding.  Musculoskeletal: Normal range of motion. She exhibits no edema or tenderness.  Lymphadenopathy:    She has no cervical adenopathy.  Neurological: She is oriented to person, place, and time.  Skin: Skin is warm and dry. No rash noted. She is not diaphoretic. No erythema. No pallor.  Psychiatric: She has a normal mood and affect. Her behavior is normal. Judgment and thought content normal.  Vitals reviewed.   Lab Results  Component Value Date   WBC 4.9 08/23/2015   HGB 13.2 08/23/2015   HCT 40.3 08/23/2015   PLT 179 08/23/2015   GLUCOSE 100* 08/24/2015   CHOL 148 08/24/2015   TRIG 53 08/24/2015   HDL 51 08/24/2015   LDLDIRECT 158.9 09/05/2012   LDLCALC 86 08/24/2015   ALT 13 04/14/2015   AST 19 04/14/2015   NA 141 08/24/2015   K 4.6 08/24/2015   CL 106 08/24/2015   CREATININE 1.00 08/24/2015   BUN 18 08/24/2015   CO2 25  08/24/2015   TSH 3.94 04/14/2015   HGBA1C 5.8* 08/24/2015    Dg Chest 2 View  08/23/2015  CLINICAL DATA:  Left arm pain and chest pressure today. EXAM: CHEST  2 VIEW COMPARISON:  06/24/2014 FINDINGS: Heart and mediastinal contours are within normal limits. No focal opacities or effusions. No acute bony abnormality. IMPRESSION: No active cardiopulmonary disease. Electronically Signed   By: Rolm Baptise M.D.   On: 08/23/2015 17:03   Nm Myocar Multi W/spect W/wall Motion / Ef  08/24/2015  CLINICAL DATA:  82 year old with risk factors including hypertension, hypercholesterolemia and family history of coronary artery disease in her father and a sister, admitted to the hospital yesterday after an episode of left-sided chest pain radiating into the left arm associated with shortness of breath, diaphoresis and dizziness. EXAM: MYOCARDIAL IMAGING WITH SPECT (REST AND PHARMACOLOGIC-STRESS) GATED LEFT VENTRICULAR WALL MOTION STUDY LEFT VENTRICULAR EJECTION FRACTION TECHNIQUE: Standard myocardial SPECT imaging was performed after resting intravenous injection of 10 mCi Tc-22m sestamibi. Subsequently, intravenous infusion of Lexiscan was performed under the supervision of the Cardiology staff. At peak effect  of the drug, 30 mCi Tc-31m sestamibi was injected intravenously and standard myocardial SPECT imaging was performed. Quantitative gated imaging was also performed to evaluate left ventricular wall motion, and estimate left ventricular ejection fraction. COMPARISON:  None. FINDINGS: Perfusion: Artifactual focal hot spot adjacent to the inferior wall on the post regadenoson images related to subdiaphragmatic activity. Slight diminished activity in the mid and distal inferior wall without evidence of reversibility which can be explained on the basis of diaphragmatic attenuation, given her elevated left hemidiaphragm on chest x-ray. Wall Motion: Normal left ventricular wall motion. No left ventricular dilation. Left  Ventricular Ejection Fraction: 0000000 End diastolic volume 93 ml End systolic volume 30 ml IMPRESSION: 1. No reversible ischemia or infarction. 2. Normal left ventricular wall motion. 3. Left ventricular ejection fraction 68% 4. Low-risk stress test findings*. *2012 Appropriate Use Criteria for Coronary Revascularization Focused Update: J Am Coll Cardiol. [id number]. http://[email or web address].[email or web address] Electronically Signed   By: Evangeline Dakin M.D.   On: 08/24/2015 15:41    Assessment & Plan:   Vaida was seen today for cough, hypertension, annual exam and osteoarthritis.  Diagnoses and all orders for this visit:  Primary osteoarthritis of both knees -     Discontinue: HYDROcodone-acetaminophen (NORCO/VICODIN) 5-325 MG tablet; Take 1 tablet by mouth every 6 (six) hours as needed for moderate pain. -     Discontinue: HYDROcodone-acetaminophen (NORCO/VICODIN) 5-325 MG tablet; Take 1 tablet by mouth every 6 (six) hours as needed for moderate pain. -     HYDROcodone-acetaminophen (NORCO/VICODIN) 5-325 MG tablet; Take 1 tablet by mouth every 6 (six) hours as needed for moderate pain.  Postlaminectomy syndrome, lumbar region -     Discontinue: HYDROcodone-acetaminophen (NORCO/VICODIN) 5-325 MG tablet; Take 1 tablet by mouth every 6 (six) hours as needed for moderate pain. -     Discontinue: HYDROcodone-acetaminophen (NORCO/VICODIN) 5-325 MG tablet; Take 1 tablet by mouth every 6 (six) hours as needed for moderate pain. -     HYDROcodone-acetaminophen (NORCO/VICODIN) 5-325 MG tablet; Take 1 tablet by mouth every 6 (six) hours as needed for moderate pain.  Need for 23-polyvalent pneumococcal polysaccharide vaccine -     Pneumococcal polysaccharide vaccine 23-valent greater than or equal to 2yo subcutaneous/IM  Visit for screening mammogram -     MM DIGITAL SCREENING BILATERAL; Future  Routine general medical examination at a health care facility  Cough- Her  chest x-rays normal, her symptoms are resolving, this appears to be a viral upper respiratory infection. Will monitor for complications and she will let me know if they occur. -     DG Chest 2 View; Future  Essential hypertension, benign- her blood pressure is well-controlled, electrolytes and renal function are stable. -     irbesartan-hydrochlorothiazide (AVALIDE) 300-12.5 MG tablet; Take 1 tablet by mouth daily.  Other orders -     Cancel: HYDROcodone-acetaminophen (NORCO/VICODIN) 5-325 MG tablet; Take 1 tablet by mouth every 6 (six) hours as needed for moderate pain.   I have discontinued Ms. Schleich's benzonatate and chlorpheniramine-HYDROcodone. I have also changed her irbesartan-hydrochlorothiazide. Additionally, I am having her maintain her ULTRA WOMENS PACK, cetirizine, aspirin, Linaclotide, fluticasone, vitamin C, vitamin E, MEGARED OMEGA-3 KRILL OIL, Pitavastatin Calcium, and HYDROcodone-acetaminophen.  Meds ordered this encounter  Medications  . DISCONTD: HYDROcodone-acetaminophen (NORCO/VICODIN) 5-325 MG tablet    Sig: Take 1 tablet by mouth every 6 (six) hours as needed for moderate pain.    Dispense:  90 tablet    Refill:  0    Fill on or  after 09/02/15  . DISCONTD: HYDROcodone-acetaminophen (NORCO/VICODIN) 5-325 MG tablet    Sig: Take 1 tablet by mouth every 6 (six) hours as needed for moderate pain.    Dispense:  90 tablet    Refill:  0    Fill on or after 10/03/15  . HYDROcodone-acetaminophen (NORCO/VICODIN) 5-325 MG tablet    Sig: Take 1 tablet by mouth every 6 (six) hours as needed for moderate pain.    Dispense:  90 tablet    Refill:  0    Fill on or after 11/02/15  . irbesartan-hydrochlorothiazide (AVALIDE) 300-12.5 MG tablet    Sig: Take 1 tablet by mouth daily.    Dispense:  90 tablet    Refill:  3   See AVS for instructions about healthy living and anticipatory guidance.  Follow-up: Return in about 4 months (around 01/02/2016).  [name], MD

## 2015-09-02 NOTE — Patient Instructions (Signed)
Preventive Care for Adults, Female A healthy lifestyle and preventive care can promote health and wellness. Preventive health guidelines for women include the following key practices.  A routine yearly physical is a good way to check with your health care provider about your health and preventive screening. It is a chance to share any concerns and updates on your health and to receive a thorough exam.  Visit your dentist for a routine exam and preventive care every 6 months. Brush your teeth twice a day and floss once a day. Good oral hygiene prevents tooth decay and gum disease.  The frequency of eye exams is based on your age, health, family medical history, use of contact lenses, and other factors. Follow your health care provider's recommendations for frequency of eye exams.  Eat a healthy diet. Foods like vegetables, fruits, whole grains, low-fat dairy products, and lean protein foods contain the nutrients you need without too many calories. Decrease your intake of foods high in solid fats, added sugars, and salt. Eat the right amount of calories for you.Get information about a proper diet from your health care provider, if necessary.  Regular physical exercise is one of the most important things you can do for your health. Most adults should get at least 150 minutes of moderate-intensity exercise (any activity that increases your heart rate and causes you to sweat) each week. In addition, most adults need muscle-strengthening exercises on 2 or more days a week.  Maintain a healthy weight. The body mass index (BMI) is a screening tool to identify possible weight problems. It provides an estimate of body fat based on height and weight. Your health care provider can find your BMI and can help you achieve or maintain a healthy weight.For adults 20 years and older:  A BMI below 18.5 is considered underweight.  A BMI of 18.5 to 24.9 is normal.  A BMI of 25 to 29.9 is considered overweight.  A  BMI of 30 and above is considered obese.  Maintain normal blood lipids and cholesterol levels by exercising and minimizing your intake of saturated fat. Eat a balanced diet with plenty of fruit and vegetables. Blood tests for lipids and cholesterol should begin at age 45 and be repeated every 5 years. If your lipid or cholesterol levels are high, you are over 50, or you are at high risk for heart disease, you may need your cholesterol levels checked more frequently.Ongoing high lipid and cholesterol levels should be treated with medicines if diet and exercise are not working.  If you smoke, find out from your health care provider how to quit. If you do not use tobacco, do not start.  Lung cancer screening is recommended for adults aged 45-80 years who are at high risk for developing lung cancer because of a history of smoking. A yearly low-dose CT scan of the lungs is recommended for people who have at least a 30-pack-year history of smoking and are a current smoker or have quit within the past 15 years. A pack year of smoking is smoking an average of 1 pack of cigarettes a day for 1 year (for example: 1 pack a day for 30 years or 2 packs a day for 15 years). Yearly screening should continue until the smoker has stopped smoking for at least 15 years. Yearly screening should be stopped for people who develop a health problem that would prevent them from having lung cancer treatment.  If you are pregnant, do not drink alcohol. If you are  breastfeeding, be very cautious about drinking alcohol. If you are not pregnant and choose to drink alcohol, do not have more than 1 drink per day. One drink is considered to be 12 ounces (355 mL) of beer, 5 ounces (148 mL) of wine, or 1.5 ounces (44 mL) of liquor.  Avoid use of street drugs. Do not share needles with anyone. Ask for help if you need support or instructions about stopping the use of drugs.  High blood pressure causes heart disease and increases the risk  of stroke. Your blood pressure should be checked at least every 1 to 2 years. Ongoing high blood pressure should be treated with medicines if weight loss and exercise do not work.  If you are 55-79 years old, ask your health care provider if you should take aspirin to prevent strokes.  Diabetes screening is done by taking a blood sample to check your blood glucose level after you have not eaten for a certain period of time (fasting). If you are not overweight and you do not have risk factors for diabetes, you should be screened once every 3 years starting at age 45. If you are overweight or obese and you are 40-70 years of age, you should be screened for diabetes every year as part of your cardiovascular risk assessment.  Breast cancer screening is essential preventive care for women. You should practice "breast self-awareness." This means understanding the normal appearance and feel of your breasts and may include breast self-examination. Any changes detected, no matter how small, should be reported to a health care provider. Women in their 20s and 30s should have a clinical breast exam (CBE) by a health care provider as part of a regular health exam every 1 to 3 years. After age 40, women should have a CBE every year. Starting at age 40, women should consider having a mammogram (breast X-ray test) every year. Women who have a family history of breast cancer should talk to their health care provider about genetic screening. Women at a high risk of breast cancer should talk to their health care providers about having an MRI and a mammogram every year.  Breast cancer gene (BRCA)-related cancer risk assessment is recommended for women who have family members with BRCA-related cancers. BRCA-related cancers include breast, ovarian, tubal, and peritoneal cancers. Having family members with these cancers may be associated with an increased risk for harmful changes (mutations) in the breast cancer genes BRCA1 and  BRCA2. Results of the assessment will determine the need for genetic counseling and BRCA1 and BRCA2 testing.  Your health care provider may recommend that you be screened regularly for cancer of the pelvic organs (ovaries, uterus, and vagina). This screening involves a pelvic examination, including checking for microscopic changes to the surface of your cervix (Pap test). You may be encouraged to have this screening done every 3 years, beginning at age 21.  For women ages 30-65, health care providers may recommend pelvic exams and Pap testing every 3 years, or they may recommend the Pap and pelvic exam, combined with testing for human papilloma virus (HPV), every 5 years. Some types of HPV increase your risk of cervical cancer. Testing for HPV may also be done on women of any age with unclear Pap test results.  Other health care providers may not recommend any screening for nonpregnant women who are considered low risk for pelvic cancer and who do not have symptoms. Ask your health care provider if a screening pelvic exam is right for   you.  If you have had past treatment for cervical cancer or a condition that could lead to cancer, you need Pap tests and screening for cancer for at least 20 years after your treatment. If Pap tests have been discontinued, your risk factors (such as having a new sexual partner) need to be reassessed to determine if screening should resume. Some women have medical problems that increase the chance of getting cervical cancer. In these cases, your health care provider may recommend more frequent screening and Pap tests.  Colorectal cancer can be detected and often prevented. Most routine colorectal cancer screening begins at the age of 50 years and continues through age 75 years. However, your health care provider may recommend screening at an earlier age if you have risk factors for colon cancer. On a yearly basis, your health care provider may provide home test kits to check  for hidden blood in the stool. Use of a small camera at the end of a tube, to directly examine the colon (sigmoidoscopy or colonoscopy), can detect the earliest forms of colorectal cancer. Talk to your health care provider about this at age 50, when routine screening begins. Direct exam of the colon should be repeated every 5-10 years through age 75 years, unless early forms of precancerous polyps or small growths are found.  People who are at an increased risk for hepatitis B should be screened for this virus. You are considered at high risk for hepatitis B if:  You were born in a country where hepatitis B occurs often. Talk with your health care provider about which countries are considered high risk.  Your parents were born in a high-risk country and you have not received a shot to protect against hepatitis B (hepatitis B vaccine).  You have HIV or AIDS.  You use needles to inject street drugs.  You live with, or have sex with, someone who has hepatitis B.  You get hemodialysis treatment.  You take certain medicines for conditions like cancer, organ transplantation, and autoimmune conditions.  Hepatitis C blood testing is recommended for all people born from 1945 through 1965 and any individual with known risks for hepatitis C.  Practice safe sex. Use condoms and avoid high-risk sexual practices to reduce the spread of sexually transmitted infections (STIs). STIs include gonorrhea, chlamydia, syphilis, trichomonas, herpes, HPV, and human immunodeficiency virus (HIV). Herpes, HIV, and HPV are viral illnesses that have no cure. They can result in disability, cancer, and death.  You should be screened for sexually transmitted illnesses (STIs) including gonorrhea and chlamydia if:  You are sexually active and are younger than 24 years.  You are older than 24 years and your health care provider tells you that you are at risk for this type of infection.  Your sexual activity has changed  since you were last screened and you are at an increased risk for chlamydia or gonorrhea. Ask your health care provider if you are at risk.  If you are at risk of being infected with HIV, it is recommended that you take a prescription medicine daily to prevent HIV infection. This is called preexposure prophylaxis (PrEP). You are considered at risk if:  You are sexually active and do not regularly use condoms or know the HIV status of your partner(s).  You take drugs by injection.  You are sexually active with a partner who has HIV.  Talk with your health care provider about whether you are at high risk of being infected with HIV. If   you choose to begin PrEP, you should first be tested for HIV. You should then be tested every 3 months for as long as you are taking PrEP.  Osteoporosis is a disease in which the bones lose minerals and strength with aging. This can result in serious bone fractures or breaks. The risk of osteoporosis can be identified using a bone density scan. Women ages 67 years and over and women at risk for fractures or osteoporosis should discuss screening with their health care providers. Ask your health care provider whether you should take a calcium supplement or vitamin D to reduce the rate of osteoporosis.  Menopause can be associated with physical symptoms and risks. Hormone replacement therapy is available to decrease symptoms and risks. You should talk to your health care provider about whether hormone replacement therapy is right for you.  Use sunscreen. Apply sunscreen liberally and repeatedly throughout the day. You should seek shade when your shadow is shorter than you. Protect yourself by wearing long sleeves, pants, a wide-brimmed hat, and sunglasses year round, whenever you are outdoors.  Once a month, do a whole body skin exam, using a mirror to look at the skin on your back. Tell your health care provider of new moles, moles that have irregular borders, moles that  are larger than a pencil eraser, or moles that have changed in shape or color.  Stay current with required vaccines (immunizations).  Influenza vaccine. All adults should be immunized every year.  Tetanus, diphtheria, and acellular pertussis (Td, Tdap) vaccine. Pregnant women should receive 1 dose of Tdap vaccine during each pregnancy. The dose should be obtained regardless of the length of time since the last dose. Immunization is preferred during the 27th-36th week of gestation. An adult who has not previously received Tdap or who does not know her vaccine status should receive 1 dose of Tdap. This initial dose should be followed by tetanus and diphtheria toxoids (Td) booster doses every 10 years. Adults with an unknown or incomplete history of completing a 3-dose immunization series with Td-containing vaccines should begin or complete a primary immunization series including a Tdap dose. Adults should receive a Td booster every 10 years.  Varicella vaccine. An adult without evidence of immunity to varicella should receive 2 doses or a second dose if she has previously received 1 dose. Pregnant females who do not have evidence of immunity should receive the first dose after pregnancy. This first dose should be obtained before leaving the health care facility. The second dose should be obtained 4-8 weeks after the first dose.  Human papillomavirus (HPV) vaccine. Females aged 13-26 years who have not received the vaccine previously should obtain the 3-dose series. The vaccine is not recommended for use in pregnant females. However, pregnancy testing is not needed before receiving a dose. If a female is found to be pregnant after receiving a dose, no treatment is needed. In that case, the remaining doses should be delayed until after the pregnancy. Immunization is recommended for any person with an immunocompromised condition through the age of 61 years if she did not get any or all doses earlier. During the  3-dose series, the second dose should be obtained 4-8 weeks after the first dose. The third dose should be obtained 24 weeks after the first dose and 16 weeks after the second dose.  Zoster vaccine. One dose is recommended for adults aged 30 years or older unless certain conditions are present.  Measles, mumps, and rubella (MMR) vaccine. Adults born  before 1957 generally are considered immune to measles and mumps. Adults born in 1957 or later should have 1 or more doses of MMR vaccine unless there is a contraindication to the vaccine or there is laboratory evidence of immunity to each of the three diseases. A routine second dose of MMR vaccine should be obtained at least 28 days after the first dose for students attending postsecondary schools, health care workers, or international travelers. People who received inactivated measles vaccine or an unknown type of measles vaccine during 1963-1967 should receive 2 doses of MMR vaccine. People who received inactivated mumps vaccine or an unknown type of mumps vaccine before 1979 and are at high risk for mumps infection should consider immunization with 2 doses of MMR vaccine. For females of childbearing age, rubella immunity should be determined. If there is no evidence of immunity, females who are not pregnant should be vaccinated. If there is no evidence of immunity, females who are pregnant should delay immunization until after pregnancy. Unvaccinated health care workers born before 1957 who lack laboratory evidence of measles, mumps, or rubella immunity or laboratory confirmation of disease should consider measles and mumps immunization with 2 doses of MMR vaccine or rubella immunization with 1 dose of MMR vaccine.  Pneumococcal 13-valent conjugate (PCV13) vaccine. When indicated, a person who is uncertain of his immunization history and has no record of immunization should receive the PCV13 vaccine. All adults 65 years of age and older should receive this  vaccine. An adult aged 19 years or older who has certain medical conditions and has not been previously immunized should receive 1 dose of PCV13 vaccine. This PCV13 should be followed with a dose of pneumococcal polysaccharide (PPSV23) vaccine. Adults who are at high risk for pneumococcal disease should obtain the PPSV23 vaccine at least 8 weeks after the dose of PCV13 vaccine. Adults older than 80 years of age who have normal immune system function should obtain the PPSV23 vaccine dose at least 1 year after the dose of PCV13 vaccine.  Pneumococcal polysaccharide (PPSV23) vaccine. When PCV13 is also indicated, PCV13 should be obtained first. All adults aged 65 years and older should be immunized. An adult younger than age 65 years who has certain medical conditions should be immunized. Any person who resides in a nursing home or long-term care facility should be immunized. An adult smoker should be immunized. People with an immunocompromised condition and certain other conditions should receive both PCV13 and PPSV23 vaccines. People with human immunodeficiency virus (HIV) infection should be immunized as soon as possible after diagnosis. Immunization during chemotherapy or radiation therapy should be avoided. Routine use of PPSV23 vaccine is not recommended for American Indians, [name] Natives, or people younger than 65 years unless there are medical conditions that require PPSV23 vaccine. When indicated, people who have unknown immunization and have no record of immunization should receive PPSV23 vaccine. One-time revaccination 5 years after the first dose of PPSV23 is recommended for people aged 19-64 years who have chronic kidney failure, nephrotic syndrome, asplenia, or immunocompromised conditions. People who received 1-2 doses of PPSV23 before age 65 years should receive another dose of PPSV23 vaccine at age 65 years or later if at least 5 years have passed since the previous dose. Doses of PPSV23 are not  needed for people immunized with PPSV23 at or after age 65 years.  Meningococcal vaccine. Adults with asplenia or persistent complement component deficiencies should receive 2 doses of quadrivalent meningococcal conjugate (MenACWY-D) vaccine. The doses should be obtained   at least 2 months apart. Microbiologists working with certain meningococcal bacteria, [place] recruits, people at risk during an outbreak, and people who travel to or live in countries with a high rate of meningitis should be immunized. A first-year college student up through age 34 years who is living in a residence hall should receive a dose if she did not receive a dose on or after her 16th birthday. Adults who have certain high-risk conditions should receive one or more doses of vaccine.  Hepatitis A vaccine. Adults who wish to be protected from this disease, have certain high-risk conditions, work with hepatitis A-infected animals, work in hepatitis A research labs, or travel to or work in countries with a high rate of hepatitis A should be immunized. Adults who were previously unvaccinated and who anticipate close contact with an international adoptee during the first 60 days after arrival in the [place] from a country with a high rate of hepatitis A should be immunized.  Hepatitis B vaccine. Adults who wish to be protected from this disease, have certain high-risk conditions, may be exposed to blood or other infectious body fluids, are household contacts or sex partners of hepatitis B positive people, are clients or workers in certain care facilities, or travel to or work in countries with a high rate of hepatitis B should be immunized.  Haemophilus influenzae type b (Hib) vaccine. A previously unvaccinated person with asplenia or sickle cell disease or having a scheduled splenectomy should receive 1 dose of Hib vaccine. Regardless of previous immunization, a recipient of a hematopoietic stem cell transplant should receive a  3-dose series 6-12 months after her successful transplant. Hib vaccine is not recommended for adults with HIV infection. Preventive Services / Frequency Ages 35 to 4 years  Blood pressure check.** / Every 3-5 years.  Lipid and cholesterol check.** / Every 5 years beginning at age 60.  Clinical breast exam.** / Every 3 years for women in their 71s and 10s.  BRCA-related cancer risk assessment.** / For women who have family members with a BRCA-related cancer (breast, ovarian, tubal, or peritoneal cancers).  Pap test.** / Every 2 years from ages 76 through 26. Every 3 years starting at age 61 through age 76 or 93 with a history of 3 consecutive normal Pap tests.  HPV screening.** / Every 3 years from ages 37 through ages 60 to 51 with a history of 3 consecutive normal Pap tests.  Hepatitis C blood test.** / For any individual with known risks for hepatitis C.  Skin self-exam. / Monthly.  Influenza vaccine. / Every year.  Tetanus, diphtheria, and acellular pertussis (Tdap, Td) vaccine.** / Consult your health care provider. Pregnant women should receive 1 dose of Tdap vaccine during each pregnancy. 1 dose of Td every 10 years.  Varicella vaccine.** / Consult your health care provider. Pregnant females who do not have evidence of immunity should receive the first dose after pregnancy.  HPV vaccine. / 3 doses over 6 months, if 93 and younger. The vaccine is not recommended for use in pregnant females. However, pregnancy testing is not needed before receiving a dose.  Measles, mumps, rubella (MMR) vaccine.** / You need at least 1 dose of MMR if you were born in 1957 or later. You may also need a 2nd dose. For females of childbearing age, rubella immunity should be determined. If there is no evidence of immunity, females who are not pregnant should be vaccinated. If there is no evidence of immunity, females who are  pregnant should delay immunization until after pregnancy.  Pneumococcal  13-valent conjugate (PCV13) vaccine.** / Consult your health care provider.  Pneumococcal polysaccharide (PPSV23) vaccine.** / 1 to 2 doses if you smoke cigarettes or if you have certain conditions.  Meningococcal vaccine.** / 1 dose if you are age 68 to 8 years and a Market researcher living in a residence hall, or have one of several medical conditions, you need to get vaccinated against meningococcal disease. You may also need additional booster doses.  Hepatitis A vaccine.** / Consult your health care provider.  Hepatitis B vaccine.** / Consult your health care provider.  Haemophilus influenzae type b (Hib) vaccine.** / Consult your health care provider. Ages 7 to 53 years  Blood pressure check.** / Every year.  Lipid and cholesterol check.** / Every 5 years beginning at age 25 years.  Lung cancer screening. / Every year if you are aged 11-80 years and have a 30-pack-year history of smoking and currently smoke or have quit within the past 15 years. Yearly screening is stopped once you have quit smoking for at least 15 years or develop a health problem that would prevent you from having lung cancer treatment.  Clinical breast exam.** / Every year after age 48 years.  BRCA-related cancer risk assessment.** / For women who have family members with a BRCA-related cancer (breast, ovarian, tubal, or peritoneal cancers).  Mammogram.** / Every year beginning at age 41 years and continuing for as long as you are in good health. Consult with your health care provider.  Pap test.** / Every 3 years starting at age 65 years through age 37 or 70 years with a history of 3 consecutive normal Pap tests.  HPV screening.** / Every 3 years from ages 72 years through ages 60 to 40 years with a history of 3 consecutive normal Pap tests.  Fecal occult blood test (FOBT) of stool. / Every year beginning at age 21 years and continuing until age 5 years. You may not need to do this test if you get  a colonoscopy every 10 years.  Flexible sigmoidoscopy or colonoscopy.** / Every 5 years for a flexible sigmoidoscopy or every 10 years for a colonoscopy beginning at age 35 years and continuing until age 48 years.  Hepatitis C blood test.** / For all people born from [birth year] through 1965 and any individual with known risks for hepatitis C.  Skin self-exam. / Monthly.  Influenza vaccine. / Every year.  Tetanus, diphtheria, and acellular pertussis (Tdap/Td) vaccine.** / Consult your health care provider. Pregnant women should receive 1 dose of Tdap vaccine during each pregnancy. 1 dose of Td every 10 years.  Varicella vaccine.** / Consult your health care provider. Pregnant females who do not have evidence of immunity should receive the first dose after pregnancy.  Zoster vaccine.** / 1 dose for adults aged 30 years or older.  Measles, mumps, rubella (MMR) vaccine.** / You need at least 1 dose of MMR if you were born in 1957 or later. You may also need a second dose. For females of childbearing age, rubella immunity should be determined. If there is no evidence of immunity, females who are not pregnant should be vaccinated. If there is no evidence of immunity, females who are pregnant should delay immunization until after pregnancy.  Pneumococcal 13-valent conjugate (PCV13) vaccine.** / Consult your health care provider.  Pneumococcal polysaccharide (PPSV23) vaccine.** / 1 to 2 doses if you smoke cigarettes or if you have certain conditions.  Meningococcal vaccine.** /  Consult your health care provider.  Hepatitis A vaccine.** / Consult your health care provider.  Hepatitis B vaccine.** / Consult your health care provider.  Haemophilus influenzae type b (Hib) vaccine.** / Consult your health care provider. Ages 64 years and over  Blood pressure check.** / Every year.  Lipid and cholesterol check.** / Every 5 years beginning at age 23 years.  Lung cancer screening. / Every year if you  are aged 16-80 years and have a 30-pack-year history of smoking and currently smoke or have quit within the past 15 years. Yearly screening is stopped once you have quit smoking for at least 15 years or develop a health problem that would prevent you from having lung cancer treatment.  Clinical breast exam.** / Every year after age 74 years.  BRCA-related cancer risk assessment.** / For women who have family members with a BRCA-related cancer (breast, ovarian, tubal, or peritoneal cancers).  Mammogram.** / Every year beginning at age 44 years and continuing for as long as you are in good health. Consult with your health care provider.  Pap test.** / Every 3 years starting at age 58 years through age 22 or 39 years with 3 consecutive normal Pap tests. Testing can be stopped between 65 and 70 years with 3 consecutive normal Pap tests and no abnormal Pap or HPV tests in the past 10 years.  HPV screening.** / Every 3 years from ages 64 years through ages 70 or 61 years with a history of 3 consecutive normal Pap tests. Testing can be stopped between 65 and 70 years with 3 consecutive normal Pap tests and no abnormal Pap or HPV tests in the past 10 years.  Fecal occult blood test (FOBT) of stool. / Every year beginning at age 40 years and continuing until age 27 years. You may not need to do this test if you get a colonoscopy every 10 years.  Flexible sigmoidoscopy or colonoscopy.** / Every 5 years for a flexible sigmoidoscopy or every 10 years for a colonoscopy beginning at age 7 years and continuing until age 32 years.  Hepatitis C blood test.** / For all people born from [birth year] through 1963 and any individual with known risks for hepatitis C.  Osteoporosis screening.** / A one-time screening for women ages 30 years and over and women at risk for fractures or osteoporosis.  Skin self-exam. / Monthly.  Influenza vaccine. / Every year.  Tetanus, diphtheria, and acellular pertussis (Tdap/Td)  vaccine.** / 1 dose of Td every 10 years.  Varicella vaccine.** / Consult your health care provider.  Zoster vaccine.** / 1 dose for adults aged 35 years or older.  Pneumococcal 13-valent conjugate (PCV13) vaccine.** / Consult your health care provider.  Pneumococcal polysaccharide (PPSV23) vaccine.** / 1 dose for all adults aged 46 years and older.  Meningococcal vaccine.** / Consult your health care provider.  Hepatitis A vaccine.** / Consult your health care provider.  Hepatitis B vaccine.** / Consult your health care provider.  Haemophilus influenzae type b (Hib) vaccine.** / Consult your health care provider. ** Family history and personal history of risk and conditions may change your health care provider's recommendations.   This information is not intended to replace advice given to you by your health care provider. Make sure you discuss any questions you have with your health care provider.   Document Released: 08/10/2001 Document Revised: 07/05/2014 Document Reviewed: 11/09/2010 Elsevier Interactive Patient Education [employer].

## 2015-09-03 NOTE — Assessment & Plan Note (Signed)

## 2015-09-14 NOTE — ED Provider Notes (Signed)
CSN: [id number]     Arrival date & time 08/23/15  1548 History   First MD Initiated Contact with Patient 08/23/15 1751     Chief Complaint  Patient presents with  . Arm Pain  . Excessive Sweating     (Consider location/radiation/quality/duration/timing/severity/associated sxs/prior Treatment) HPI Comments: 82 y.o. woman with a history of HTN and HL presents for evaluation of chest pain. The patient reports that yday she was  having acute onset of significant diaphoresis and a feeling of extreme fatigue/exhaustion, which is atypical for her. Symptoms were unprovoked and lasted for 20 minutes. She rested for about an hour and felt fine for the rest of the day. This morning, she developed nonexertional left-sided chest pressure that radiated to her left arm and was associated with shortness of breath, diaphoresis, and dizziness. Symptoms lasted for almost 30 minutes and resolved without specific intervention (she had already taken her baby aspirin for the day).She has been chest pain free since presentation to the ED. No hx of CAD. No hx of provocative cardiac evaluation in the last 2 years.   ROS 10 Systems reviewed and are negative for acute change except as noted in the HPI.     Patient is a 82 y.o. female presenting with arm pain. The history is provided by the patient.  Arm Pain    Past Medical History  Diagnosis Date  . Osteoarthritis   . Pure hypercholesterolemia   . Essential hypertension, benign   . Gouty arthropathy   . Lumbago   . Degeneration of lumbar or lumbosacral intervertebral disc   . Brachial neuritis or radiculitis NOS   . Unspecified hypothyroidism   . Obesity   . Abnormality of gait    Past Surgical History  Procedure Laterality Date  . Right knee replacement  03/13/2010  . Lumbar laminectomy  03/13/2010  . Abdominal hysterectomy  03/13/2010  . Back surgery     Family History  Problem Relation Age of Onset  . Heart disease Father   . Hypertension  Mother   . Alzheimer's disease Mother   . Cancer Neg Hx   . Kidney disease Neg Hx   . Diabetes Sister   . Cirrhosis Brother    Social History  Substance Use Topics  . Smoking status: Never Smoker   . Smokeless tobacco: Never Used  . Alcohol Use: No     Comment: social   OB History    No data available     Review of Systems    Allergies  Lipitor and Trileptal  Home Medications   Prior to Admission medications   Medication Sig Start Date End Date Taking? Authorizing Provider  aspirin 81 MG tablet Take 81 mg by mouth daily.   Yes Historical Provider, MD  cetirizine (ZYRTEC) 10 MG tablet Take 10 mg by mouth daily.   Yes Historical Provider, MD  fluticasone (FLONASE) 50 MCG/ACT nasal spray Place 2 sprays into both nostrils daily. 08/22/15  Yes Hoyt Koch, MD  Linaclotide [facility]) 290 MCG CAPS capsule Take 1 capsule (290 mcg total) by mouth daily. Patient taking differently: Take 290 mcg by mouth every other day.  04/14/15  Yes Janith Lima, MD  MEGARED OMEGA-3 KRILL OIL 500 MG CAPS Take 500 mg by mouth daily.   Yes Historical Provider, MD  Multiple Vitamins-Minerals (ULTRA WOMENS PACK) MISC Take 1 tablet by mouth daily.   Yes Historical Provider, MD  vitamin C (ASCORBIC ACID) 500 MG tablet Take 500 mg by mouth daily.  Yes Historical Provider, MD  vitamin E 400 UNIT capsule Take 400 Units by mouth daily.   Yes Historical Provider, MD  HYDROcodone-acetaminophen (NORCO/VICODIN) 5-325 MG tablet Take 1 tablet by mouth every 6 (six) hours as needed for moderate pain. 09/02/15   Janith Lima, MD  irbesartan-hydrochlorothiazide (AVALIDE) 300-12.5 MG tablet Take 1 tablet by mouth daily. 09/02/15   Janith Lima, MD  Pitavastatin Calcium (LIVALO) 2 MG TABS Take 1 tablet (2 mg total) by mouth daily. 08/25/15   Janith Lima, MD   BP 134/57 mmHg  Pulse 56  Temp(Src) 97.4 F (36.3 C) (Oral)  Resp 18  Ht 5\' 10"  (1.778 m)  Wt 247 lb (112.038 kg)  BMI 35.44 kg/m2  SpO2  99% Physical Exam  Constitutional: She is oriented to person, place, and time. She appears well-developed.  HENT:  Head: Normocephalic and atraumatic.  Eyes: Conjunctivae and EOM are normal. Pupils are equal, round, and reactive to light.  Neck: Normal range of motion. Neck supple.  Cardiovascular: Normal rate, regular rhythm, normal heart sounds and intact distal pulses.   Pulmonary/Chest: Effort normal and breath sounds normal. No respiratory distress.  Abdominal: Soft. Bowel sounds are normal. She exhibits no distension. There is no tenderness. There is no rebound and no guarding.  Neurological: She is alert and oriented to person, place, and time.  Skin: Skin is warm and dry.  Nursing note and vitals reviewed.   ED Course  Procedures (including critical care time) Labs Review Labs Reviewed  BASIC METABOLIC PANEL - Abnormal; Notable for the following:    Glucose, Bld 108 (*)    Creatinine, Ser 1.11 (*)    GFR calc non Af Amer 46 (*)    GFR calc Af Amer 53 (*)    All other components within normal limits  HEMOGLOBIN A1C - Abnormal; Notable for the following:    Hgb A1c MFr Bld 5.8 (*)    All other components within normal limits  URINALYSIS, ROUTINE W REFLEX MICROSCOPIC (NOT AT [facility]) - Abnormal; Notable for the following:    APPearance CLOUDY (*)    Ketones, ur 15 (*)    Leukocytes, UA MODERATE (*)    All other components within normal limits  URINE MICROSCOPIC-ADD ON - Abnormal; Notable for the following:    Squamous Epithelial / LPF 0-5 (*)    Bacteria, UA RARE (*)    All other components within normal limits  BASIC METABOLIC PANEL - Abnormal; Notable for the following:    Glucose, Bld 100 (*)    GFR calc non Af Amer 52 (*)    All other components within normal limits  CBC  TROPONIN I  TROPONIN I  TROPONIN I  LIPID PANEL  I-STAT TROPOININ, ED    Imaging Review No results found. I have personally reviewed and evaluated these images and lab results as part of my  medical decision-making.   EKG Interpretation   Date/Time:  Saturday August 23 2015 15:54:11 EST Ventricular Rate:  73 PR Interval:  176 QRS Duration: 76 QT Interval:  418 QTC Calculation: 460 R Axis:   15 Text Interpretation:  Normal sinus rhythm Normal ECG normal interval and  axis  No significant change since last tracing Confirmed by Jozee Hammer, MD,  Thelma Comp [phone]) on 08/23/2015 6:14:23 PM      MDM   Final diagnoses:  Angina pectoris ([place])    Pt comes in with cc of chest pain. Chest pain has typical features, and with her age  the HEAR score is 5 (2 for age, 2 for hx and 1 for risk factor). We will admit the patient for further testing. Pt has no active chest pain.    Varney Biles, MD 09/14/15 [phone]

## 2015-09-25 ENCOUNTER — Ambulatory Visit
Admission: RE | Admit: 2015-09-25 | Discharge: 2015-09-25 | Disposition: A | Discharge status: Home / Self Care | Payer: PPO | Source: Ambulatory Visit | Attending: Internal Medicine | Admitting: Internal Medicine

## 2015-09-25 DIAGNOSIS — Z1231 Encounter for screening mammogram for malignant neoplasm of breast: Secondary | ICD-10-CM

## 2015-09-26 LAB — HM MAMMOGRAPHY: HM Mammogram: ABNORMAL — AB (ref 0–4)

## 2015-09-30 ENCOUNTER — Other Ambulatory Visit: Payer: Self-pay | Admitting: Internal Medicine

## 2015-09-30 DIAGNOSIS — R928 Other abnormal and inconclusive findings on diagnostic imaging of breast: Secondary | ICD-10-CM

## 2015-10-06 ENCOUNTER — Other Ambulatory Visit: Payer: PPO

## 2015-10-09 ENCOUNTER — Ambulatory Visit
Admission: RE | Admit: 2015-10-09 | Discharge: 2015-10-09 | Disposition: A | Discharge status: Home / Self Care | Payer: PPO | Source: Ambulatory Visit | Attending: Internal Medicine | Admitting: Internal Medicine

## 2015-10-09 DIAGNOSIS — R928 Other abnormal and inconclusive findings on diagnostic imaging of breast: Secondary | ICD-10-CM

## 2015-10-09 DIAGNOSIS — N6002 Solitary cyst of left breast: Secondary | ICD-10-CM | POA: Diagnosis not present

## 2015-10-09 LAB — HM MAMMOGRAPHY

## 2015-10-13 ENCOUNTER — Ambulatory Visit: Payer: PPO | Admitting: Internal Medicine

## 2015-12-04 ENCOUNTER — Telehealth: Payer: Self-pay | Admitting: *Deleted

## 2015-12-04 ENCOUNTER — Other Ambulatory Visit: Payer: Self-pay

## 2015-12-04 DIAGNOSIS — M961 Postlaminectomy syndrome, not elsewhere classified: Secondary | ICD-10-CM

## 2015-12-04 DIAGNOSIS — M170 Bilateral primary osteoarthritis of knee: Secondary | ICD-10-CM

## 2015-12-04 MED ORDER — HYDROCODONE-ACETAMINOPHEN 5-325 MG PO TABS: 1.0000 | ORAL_TABLET | Freq: Four times a day (QID) | ORAL | Status: DC | PRN

## 2015-12-04 NOTE — Telephone Encounter (Signed)
done

## 2015-12-04 NOTE — Telephone Encounter (Signed)
Notified pt rx ready for pick-up.../lmb 

## 2015-12-04 NOTE — Telephone Encounter (Signed)
HYDROcodone-acetaminophen (NORCO/VICODIN) 5-325 MG tablet [id number]     Patient is calling and requesting medication. She states she called earlier twice already about this. I informed her to give 24 to 48 hours.

## 2015-12-04 NOTE — Telephone Encounter (Signed)
Left msg on triage wanting to pick up rx for hydrocodone...Claudia [name]

## 2015-12-04 NOTE — Telephone Encounter (Signed)
This is the first msg in the system regardin this. Will route to pcp

## 2015-12-05 NOTE — Telephone Encounter (Signed)
Duplicate msg. Pt was notified yesterday rx ready for pick-up...Claudia [name]

## 2016-01-01 ENCOUNTER — Telehealth: Payer: Self-pay

## 2016-01-01 ENCOUNTER — Encounter: Payer: Self-pay | Admitting: Internal Medicine

## 2016-01-01 ENCOUNTER — Other Ambulatory Visit (INDEPENDENT_AMBULATORY_CARE_PROVIDER_SITE_OTHER): Payer: PPO

## 2016-01-01 ENCOUNTER — Ambulatory Visit (INDEPENDENT_AMBULATORY_CARE_PROVIDER_SITE_OTHER): Payer: PPO | Admitting: Internal Medicine

## 2016-01-01 VITALS — BP 130/70 | HR 45 | Temp 97.9°F | Resp 16 | Wt 239.0 lb

## 2016-01-01 DIAGNOSIS — E038 Other specified hypothyroidism: Secondary | ICD-10-CM

## 2016-01-01 DIAGNOSIS — T402X5A Adverse effect of other opioids, initial encounter: Secondary | ICD-10-CM

## 2016-01-01 DIAGNOSIS — K5903 Drug induced constipation: Secondary | ICD-10-CM | POA: Diagnosis not present

## 2016-01-01 DIAGNOSIS — M170 Bilateral primary osteoarthritis of knee: Secondary | ICD-10-CM | POA: Diagnosis not present

## 2016-01-01 DIAGNOSIS — I10 Essential (primary) hypertension: Secondary | ICD-10-CM

## 2016-01-01 DIAGNOSIS — R001 Bradycardia, unspecified: Secondary | ICD-10-CM | POA: Insufficient documentation

## 2016-01-01 DIAGNOSIS — M961 Postlaminectomy syndrome, not elsewhere classified: Secondary | ICD-10-CM

## 2016-01-01 LAB — COMPREHENSIVE METABOLIC PANEL
ALT: 15 U/L (ref 0–35)
AST: 19 U/L (ref 0–37)
Albumin: 4.1 g/dL (ref 3.5–5.2)
Alkaline Phosphatase: 70 U/L (ref 39–117)
BUN: 22 mg/dL (ref 6–23)
CHLORIDE: 107 meq/L (ref 96–112)
CO2: 28 meq/L (ref 19–32)
Calcium: 10.1 mg/dL (ref 8.4–10.5)
Creatinine, Ser: 0.89 mg/dL (ref 0.40–1.20)
GFR: 78.51 mL/min (ref >60.00)
GLUCOSE: 97 mg/dL (ref 70–99)
POTASSIUM: 4.5 meq/L (ref 3.5–5.1)
SODIUM: 140 meq/L (ref 135–145)
Total Bilirubin: 1 mg/dL (ref 0.2–1.2)
Total Protein: 6.9 g/dL (ref 6.0–8.3)

## 2016-01-01 LAB — TSH: TSH: 2.81 u[IU]/mL (ref 0.35–4.50)

## 2016-01-01 LAB — T4, FREE: Free T4: 0.9 ng/dL (ref 0.60–1.60)

## 2016-01-01 LAB — MAGNESIUM: Magnesium: 2 mg/dL (ref 1.5–2.5)

## 2016-01-01 MED ORDER — HYDROCODONE-ACETAMINOPHEN 5-325 MG PO TABS: 1.0000 | ORAL_TABLET | Freq: Four times a day (QID) | ORAL | Status: DC | PRN

## 2016-01-01 MED ORDER — METHYLNALTREXONE BROMIDE 150 MG PO TABS: 3.0000 | ORAL_TABLET | Freq: Every day | ORAL | Status: DC

## 2016-01-01 MED ORDER — HYDROCODONE-ACETAMINOPHEN 5-325 MG PO TABS
1.0000 | ORAL_TABLET | Freq: Four times a day (QID) | ORAL | Status: DC | PRN
Start: 2016-01-01 — End: 2016-03-04

## 2016-01-01 NOTE — Patient Instructions (Signed)
Bradycardia  Bradycardia is a slower-than-normal heart rate. A normal resting heart rate for an adult ranges from 60 to 100 beats per minute. With bradycardia, the resting heart rate is less than 60 beats per minute.  Bradycardia is a problem if your heart cannot pump enough oxygen-rich blood through your body. Bradycardia is not a problem for everyone. For some healthy adults, a slow resting heart rate is normal.   CAUSES   Bradycardia may be caused by:  · A problem with the heart's electrical system, such as heart block.  · A problem with the heart's natural pacemaker (sinus node).  · Heart disease, damage, or infection.  · Certain medicines that treat heart conditions.  · Certain conditions, such as hypothyroidism and obstructive sleep apnea.  RISK FACTORS   Risk factors include:  · Being 65 or older.  · Having high blood pressure (hypertension), high cholesterol (hyperlipidemia), or diabetes.  · Drinking heavily, using tobacco products, or using drugs.  · Being stressed.  SIGNS AND SYMPTOMS   Signs and symptoms include:  · Light-headedness.  · Fainting or near fainting.  · Fatigue and weakness.  · Shortness of breath.  · Chest pain (angina).  · Drowsiness.  · Confusion.  · Dizziness.  DIAGNOSIS   Diagnosis of bradycardia may include:  · A physical exam.  · An electrocardiogram (ECG).  · Blood tests.  TREATMENT   Treatment for bradycardia may include:  · Treatment of an underlying condition.  · Pacemaker placement. A pacemaker is a small, battery-powered device that is placed under the skin and is programmed to sense your heartbeats. If your heart rate is lower than the programmed rate, the pacemaker will pace your heart.  · Changing your medicines or dosages.  HOME CARE INSTRUCTIONS  · Take medicines only as directed by your health care provider.  · Manage any health conditions that contribute to bradycardia as directed by your health care provider.  · Follow a heart-healthy diet. A dietitian can help educate  you on healthy food options and changes.  · Follow an exercise program approved by your health care provider.  · Maintain a healthy weight. Lose weight as approved by your health care provider.  · Do not use tobacco products, including cigarettes, chewing tobacco, or electronic cigarettes. If you need help quitting, ask your health care provider.  · Do not use illegal drugs.  · Limit alcohol intake to no more than 1 drink per day for nonpregnant women and 2 drinks per day for men. One drink equals 12 ounces of beer, 5 ounces of wine, or 1½ ounces of hard liquor.  · Keep all follow-up visits as directed by your health care provider. This is important.  SEEK MEDICAL CARE IF:  · You feel light-headed or dizzy.  · You almost faint.  · You feel weak or are easily fatigued during physical activity.  · You experience confusion or have memory problems.  SEEK IMMEDIATE MEDICAL CARE IF:   · You faint.  · You have an irregular heartbeat.  · You have chest pain.  · You have trouble breathing.  MAKE SURE YOU:   · Understand these instructions.  · Will watch your condition.  · Will get help right away if you are not doing well or get worse.     This information is not intended to replace advice given to you by your health care provider. Make sure you discuss any questions you have with your health care provider.       Document Released: 03/06/2002 Document Revised: 07/05/2014 Document Reviewed: 09/19/2013  Elsevier Interactive Patient Education ©2016 [employer]

## 2016-01-01 NOTE — Progress Notes (Signed)
Pre visit review using our clinic review tool, if applicable. No additional management support is needed unless otherwise documented below in the visit note. 

## 2016-01-01 NOTE — Telephone Encounter (Signed)
Patient is requesting some samples of Linzess? She states she picks them up all the time here. Can you please advise or follow up. Thank you.

## 2016-01-01 NOTE — Telephone Encounter (Signed)
PA initiated via CoverMyMeds key [id number]

## 2016-01-01 NOTE — Progress Notes (Signed)
Subjective:  Patient ID: Claudia [name], female    DOB: 26-Aug-1933  Age: 82 y.o. MRN: [id number]  CC: Hypertension; Hypothyroidism; Back Pain; and Osteoarthritis   HPI Claudia [name] presents for follow-up on multiple medical problems. She complains of persistent constipation despite the use of Linzess. Her most compelling risk factor for constipation is the daily use of hydrocodone. She complains of persistent but unchanged back and joint pain.  She tells me her blood pressure has been well controlled on the combination of an ARB and thiazide diuretic. She has had a few episodes of fatigue but she denies palpitations, presyncope, syncope, headache, blurred vision, chest pain, shortness of breath, or edema.  Outpatient Prescriptions Prior to Visit  Medication Sig Dispense Refill  . aspirin 81 MG tablet Take 81 mg by mouth daily.    . cetirizine (ZYRTEC) 10 MG tablet Take 10 mg by mouth daily.    . fluticasone (FLONASE) 50 MCG/ACT nasal spray Place 2 sprays into both nostrils daily. 16 g 6  . irbesartan-hydrochlorothiazide (AVALIDE) 300-12.5 MG tablet Take 1 tablet by mouth daily. 90 tablet 3  . MEGARED OMEGA-3 KRILL OIL 500 MG CAPS Take 500 mg by mouth daily.    . Multiple Vitamins-Minerals (ULTRA WOMENS PACK) MISC Take 1 tablet by mouth daily.    . Pitavastatin Calcium (LIVALO) 2 MG TABS Take 1 tablet (2 mg total) by mouth daily. 90 tablet 3  . vitamin C (ASCORBIC ACID) 500 MG tablet Take 500 mg by mouth daily.    . vitamin E 400 UNIT capsule Take 400 Units by mouth daily.    [name] HYDROcodone-acetaminophen (NORCO/VICODIN) 5-325 MG tablet Take 1 tablet by mouth every 6 (six) hours as needed for moderate pain. 90 tablet 0  . Linaclotide (LINZESS) 290 MCG CAPS capsule Take 1 capsule (290 mcg total) by mouth daily. (Patient taking differently: Take 290 mcg by mouth every other day. ) 30 capsule 11   No facility-administered medications prior to visit.    ROS Review of Systems    Constitutional: Positive for fatigue. Negative for fever, chills, diaphoresis, appetite change and unexpected weight change.  HENT: Negative.   Eyes: Negative.  Negative for visual disturbance.  Respiratory: Negative.  Negative for cough, choking, chest tightness, shortness of breath and stridor.   Cardiovascular: Negative.  Negative for chest pain, palpitations and leg swelling.  Gastrointestinal: Positive for constipation. Negative for nausea, vomiting, abdominal pain, diarrhea and anal bleeding.  Endocrine: Negative.   Genitourinary: Negative.   Musculoskeletal: Positive for back pain and arthralgias. Negative for myalgias, joint swelling and neck pain.  Skin: Negative.  Negative for color change and rash.  Allergic/Immunologic: Negative.   Neurological: Negative.  Negative for dizziness, tremors, weakness, light-headedness, numbness and headaches.  Hematological: Negative.  Negative for adenopathy. Does not bruise/bleed easily.  Psychiatric/Behavioral: Negative.     Objective:  BP 130/70 mmHg  Pulse 45  Temp(Src) 97.9 F (36.6 C) (Oral)  Resp 16  Wt 239 lb (108.41 kg)  SpO2 95%  BP Readings from Last 3 Encounters:  01/01/16 130/70  09/02/15 140/80  08/24/15 134/57    Wt Readings from Last 3 Encounters:  01/01/16 239 lb (108.41 kg)  09/02/15 246 lb (111.585 kg)  08/23/15 247 lb (112.038 kg)    Physical Exam  Constitutional: She is oriented to person, place, and time. No distress.  HENT:  Mouth/Throat: Oropharynx is clear and moist. No oropharyngeal exudate.  Eyes: Conjunctivae are normal. Right eye exhibits no discharge. Left  eye exhibits no discharge. No scleral icterus.  Neck: Normal range of motion. Neck supple. No JVD present. No tracheal deviation present. No thyromegaly present.  Cardiovascular: Regular rhythm, S1 normal, S2 normal, normal heart sounds and intact distal pulses.   No extrasystoles are present. Bradycardia present.  Exam reveals no gallop and no  friction rub.   No murmur heard. EKG ---  Marked sinus  Bradycardia  BORDERLINE RHYTHM  Pulmonary/Chest: Effort normal and breath sounds normal. No stridor. No respiratory distress. She has no wheezes. She has no rales. She exhibits no tenderness.  Abdominal: Soft. Bowel sounds are normal. She exhibits no distension and no mass. There is no tenderness. There is no rebound and no guarding.  Musculoskeletal: Normal range of motion. She exhibits no edema or tenderness.  Lymphadenopathy:    She has no cervical adenopathy.  Neurological: She is oriented to person, place, and time.  Skin: Skin is warm and dry. No rash noted. She is not diaphoretic. No erythema. No pallor.  Vitals reviewed.   Lab Results  Component Value Date   WBC 4.9 08/23/2015   HGB 13.2 08/23/2015   HCT 40.3 08/23/2015   PLT 179 08/23/2015   GLUCOSE 97 01/01/2016   CHOL 148 08/24/2015   TRIG 53 08/24/2015   HDL 51 08/24/2015   LDLDIRECT 158.9 09/05/2012   LDLCALC 86 08/24/2015   ALT 15 01/01/2016   AST 19 01/01/2016   NA 140 01/01/2016   K 4.5 01/01/2016   CL 107 01/01/2016   CREATININE 0.89 01/01/2016   BUN 22 01/01/2016   CO2 28 01/01/2016   TSH 2.81 01/01/2016   HGBA1C 5.8* 08/24/2015    US Breast [place] Axilla  10/09/2015  CLINICAL DATA:  Possible mass in the 6 o'clock position of the left breast on a recent 2D screening mammogram. EXAM: 2D DIGITAL DIAGNOSTIC LEFT MAMMOGRAM WITH CAD AND ADJUNCT TOMO ULTRASOUND LEFT BREAST COMPARISON:  Previous exam(s). ACR Breast Density Category b: There are scattered areas of fibroglandular density. FINDINGS: 2D and 3D tomographic images of the left breast confirm a small area of asymmetrical density in the 6 o'clock position of the left breast. No new findings suspicious for malignancy elsewhere in the breast. Mammographic images were processed with CAD. On physical exam, no mass is palpable in the inferior left breast. Targeted ultrasound is performed, showing  a 10 x 8 x 2 mm cyst containing inner internal septations in the 5 o'clock position of the left breast, 4 cm from the nipple. No internal blood flow was seen with color Doppler. This corresponds to the mammographic mass. IMPRESSION: Left breast cyst.  No evidence of malignancy. RECOMMENDATION: Bilateral screening mammogram in 1 year. I have discussed the findings and recommendations with the patient. Results were also provided in writing at the conclusion of the visit. If applicable, a reminder letter will be sent to the patient regarding the next appointment. BI-RADS CATEGORY  2: Benign. Electronically Signed   By: Claudie Revering M.D.   On: 10/09/2015 13:34   Mm Diag Breast Tomo Uni Left  10/09/2015  CLINICAL DATA:  Possible mass in the 6 o'clock position of the left breast on a recent 2D screening mammogram. EXAM: 2D DIGITAL DIAGNOSTIC LEFT MAMMOGRAM WITH CAD AND ADJUNCT TOMO ULTRASOUND LEFT BREAST COMPARISON:  Previous exam(s). ACR Breast Density Category b: There are scattered areas of fibroglandular density. FINDINGS: 2D and 3D tomographic images of the left breast confirm a small area of asymmetrical density in the  6 o'clock position of the left breast. No new findings suspicious for malignancy elsewhere in the breast. Mammographic images were processed with CAD. On physical exam, no mass is palpable in the inferior left breast. Targeted ultrasound is performed, showing a 10 x 8 x 2 mm cyst containing inner internal septations in the 5 o'clock position of the left breast, 4 cm from the nipple. No internal blood flow was seen with color Doppler. This corresponds to the mammographic mass. IMPRESSION: Left breast cyst.  No evidence of malignancy. RECOMMENDATION: Bilateral screening mammogram in 1 year. I have discussed the findings and recommendations with the patient. Results were also provided in writing at the conclusion of the visit. If applicable, a reminder letter will be sent to the patient regarding the  next appointment. BI-RADS CATEGORY  2: Benign. Electronically Signed   By: Claudie Revering M.D.   On: 10/09/2015 13:34    Assessment & Plan:   Mana was seen today for hypertension, hypothyroidism, back pain and osteoarthritis.  Diagnoses and all orders for this visit:  Primary osteoarthritis of both knees -     Discontinue: HYDROcodone-acetaminophen (NORCO/VICODIN) 5-325 MG tablet; Take 1 tablet by mouth every 6 (six) hours as needed for moderate pain. -     Discontinue: HYDROcodone-acetaminophen (NORCO/VICODIN) 5-325 MG tablet; Take 1 tablet by mouth every 6 (six) hours as needed for moderate pain. -     Discontinue: HYDROcodone-acetaminophen (NORCO/VICODIN) 5-325 MG tablet; Take 1 tablet by mouth every 6 (six) hours as needed for moderate pain. -     HYDROcodone-acetaminophen (NORCO/VICODIN) 5-325 MG tablet; Take 1 tablet by mouth every 6 (six) hours as needed for moderate pain.  Postlaminectomy syndrome, lumbar region -     Discontinue: HYDROcodone-acetaminophen (NORCO/VICODIN) 5-325 MG tablet; Take 1 tablet by mouth every 6 (six) hours as needed for moderate pain. -     Discontinue: HYDROcodone-acetaminophen (NORCO/VICODIN) 5-325 MG tablet; Take 1 tablet by mouth every 6 (six) hours as needed for moderate pain. -     Discontinue: HYDROcodone-acetaminophen (NORCO/VICODIN) 5-325 MG tablet; Take 1 tablet by mouth every 6 (six) hours as needed for moderate pain. -     HYDROcodone-acetaminophen (NORCO/VICODIN) 5-325 MG tablet; Take 1 tablet by mouth every 6 (six) hours as needed for moderate pain.  Essential hypertension, benign- her blood pressure is adequately well-controlled, electrolytes and renal function are stable. -     Comprehensive metabolic panel; Future -     Magnesium; Future  Other specified hypothyroidism- her TSH is in the normal range, she will remain on the current dose of levothyroxine. -     TSH; Future -     Comprehensive metabolic panel; Future  Bradycardia- her  heart rate is in the mid 40s, she complains of fatigue but no other alarming symptoms, she does not take any medications that would cause bradycardia, her labs do not indicate any secondary causes for bradycardia, I've asked her to undergo a 48 hour Holter monitor to screen for significant pauses or to see if the bradycardia is more significant than what we are seeing today. She is currently asymptomatic - she will let me know if she develops any new symptoms. -     TSH; Future -     Comprehensive metabolic panel; Future -     Holter monitor - 48 hour; Future -     EKG 12-Lead -     T3; Future -     T4, free; Future  Therapeutic opioid-induced constipation (OIC)- she has  not responded well to Linzess so I have asked her to try Relistor. -     Methylnaltrexone Bromide (RELISTOR) 150 MG TABS; Take 3 tablets by mouth daily. -     Magnesium; Future -     T3; Future -     T4, free; Future   I have discontinued Ms. Villalon's linaclotide. I am also having her start on Methylnaltrexone Bromide. Additionally, I am having her maintain her ULTRA WOMENS PACK, cetirizine, aspirin, fluticasone, vitamin C, vitamin E, MEGARED OMEGA-3 KRILL OIL, Pitavastatin Calcium, irbesartan-hydrochlorothiazide, and HYDROcodone-acetaminophen.  Meds ordered this encounter  Medications  . DISCONTD: HYDROcodone-acetaminophen (NORCO/VICODIN) 5-325 MG tablet    Sig: Take 1 tablet by mouth every 6 (six) hours as needed for moderate pain.    Dispense:  90 tablet    Refill:  0    Fill on or after 01/01/16  . DISCONTD: HYDROcodone-acetaminophen (NORCO/VICODIN) 5-325 MG tablet    Sig: Take 1 tablet by mouth every 6 (six) hours as needed for moderate pain.    Dispense:  90 tablet    Refill:  0    Fill on or after 02/01/16  . DISCONTD: HYDROcodone-acetaminophen (NORCO/VICODIN) 5-325 MG tablet    Sig: Take 1 tablet by mouth every 6 (six) hours as needed for moderate pain.    Dispense:  90 tablet    Refill:  0    Fill on or  after 03/03/16  . HYDROcodone-acetaminophen (NORCO/VICODIN) 5-325 MG tablet    Sig: Take 1 tablet by mouth every 6 (six) hours as needed for moderate pain.    Dispense:  90 tablet    Refill:  0    Fill on or after 04/02/16  . Methylnaltrexone Bromide (RELISTOR) 150 MG TABS    Sig: Take 3 tablets by mouth daily.    Dispense:  90 tablet    Refill:  11     Follow-up: Return in about 2 months (around 03/03/2016).  [name], MD

## 2016-01-02 ENCOUNTER — Encounter: Payer: Self-pay | Admitting: Internal Medicine

## 2016-01-02 LAB — T3: T3 TOTAL: 101 ng/dL (ref 76–181)

## 2016-01-02 NOTE — Telephone Encounter (Signed)
Have samples at the front ready for pick up. Pt aware.

## 2016-01-02 NOTE — Telephone Encounter (Signed)
APPROVED 12/29/2015 - 04/30/2016 pharmacy to be notified via CoverMyMeds

## 2016-03-04 ENCOUNTER — Encounter: Payer: Self-pay | Admitting: Internal Medicine

## 2016-03-04 ENCOUNTER — Ambulatory Visit (INDEPENDENT_AMBULATORY_CARE_PROVIDER_SITE_OTHER): Payer: PPO | Admitting: Internal Medicine

## 2016-03-04 ENCOUNTER — Other Ambulatory Visit (INDEPENDENT_AMBULATORY_CARE_PROVIDER_SITE_OTHER): Payer: PPO

## 2016-03-04 VITALS — BP 122/70 | HR 48 | Temp 97.8°F | Resp 16 | Ht 70.0 in | Wt 246.5 lb

## 2016-03-04 DIAGNOSIS — R600 Localized edema: Secondary | ICD-10-CM

## 2016-03-04 DIAGNOSIS — R791 Abnormal coagulation profile: Secondary | ICD-10-CM

## 2016-03-04 DIAGNOSIS — R7989 Other specified abnormal findings of blood chemistry: Secondary | ICD-10-CM

## 2016-03-04 DIAGNOSIS — E039 Hypothyroidism, unspecified: Secondary | ICD-10-CM

## 2016-03-04 DIAGNOSIS — I10 Essential (primary) hypertension: Secondary | ICD-10-CM

## 2016-03-04 DIAGNOSIS — K5903 Drug induced constipation: Secondary | ICD-10-CM | POA: Diagnosis not present

## 2016-03-04 DIAGNOSIS — R001 Bradycardia, unspecified: Secondary | ICD-10-CM | POA: Diagnosis not present

## 2016-03-04 DIAGNOSIS — Z23 Encounter for immunization: Secondary | ICD-10-CM

## 2016-03-04 DIAGNOSIS — M961 Postlaminectomy syndrome, not elsewhere classified: Secondary | ICD-10-CM

## 2016-03-04 DIAGNOSIS — T402X5A Adverse effect of other opioids, initial encounter: Secondary | ICD-10-CM

## 2016-03-04 DIAGNOSIS — M170 Bilateral primary osteoarthritis of knee: Secondary | ICD-10-CM

## 2016-03-04 LAB — TSH: TSH: 6.11 u[IU]/mL — ABNORMAL HIGH (ref 0.35–4.50)

## 2016-03-04 LAB — BRAIN NATRIURETIC PEPTIDE: Pro B Natriuretic peptide (BNP): 199 pg/mL — ABNORMAL HIGH (ref 0.0–100.0)

## 2016-03-04 LAB — BASIC METABOLIC PANEL
BUN: 22 mg/dL (ref 6–23)
CALCIUM: 9.4 mg/dL (ref 8.4–10.5)
CO2: 30 meq/L (ref 19–32)
CREATININE: 1.05 mg/dL (ref 0.40–1.20)
Chloride: 106 mEq/L (ref 96–112)
GFR: 64.85 mL/min (ref >60.00)
GLUCOSE: 90 mg/dL (ref 70–99)
Potassium: 4.8 mEq/L (ref 3.5–5.1)
Sodium: 139 mEq/L (ref 135–145)

## 2016-03-04 LAB — D-DIMER, QUANTITATIVE: D-Dimer, Quant: 1.34 mcg/mL FEU — ABNORMAL HIGH (ref <0.50)

## 2016-03-04 MED ORDER — LUBIPROSTONE 24 MCG PO CAPS: 24.0000 ug | ORAL_CAPSULE | Freq: Two times a day (BID) | ORAL | 3 refills | Status: DC

## 2016-03-04 MED ORDER — HYDROCODONE-ACETAMINOPHEN 5-325 MG PO TABS: 1.0000 | ORAL_TABLET | Freq: Four times a day (QID) | ORAL | 0 refills | Status: DC | PRN

## 2016-03-04 NOTE — Patient Instructions (Signed)

## 2016-03-04 NOTE — Progress Notes (Signed)
Subjective:  Patient ID: Claudia [name], female    DOB: [birth year]  Age: 82 y.o. MRN: [id number]  CC: Hypothyroidism; Osteoarthritis; Constipation; and Hypertension   HPI CRUSITA PILLE presents for follow-up. She complains of chronic but worsening swelling in both lower extremities over the last few months. The symptoms are more prominent on the left than the right. She denies chest pain/shortness of breath/hemoptysis/palpitations/syncope or near syncope.  She also complains of persistent constipation. This is related to the hydrocodone therapy. She has tried Linzess without much relief and then most recently Relistor and she tells me that was not very helpful as it only caused a bowel movement every 2 days. She wants to try something different.  I last saw her she was evaluated for bradycardia and fatigue. I ordered a cardiac monitor but it was never done. She continues to complain of fatigue but his had no palpitations or syncope.  Outpatient Medications Prior to Visit  Medication Sig Dispense Refill  . aspirin 81 MG tablet Take 81 mg by mouth daily.    . cetirizine (ZYRTEC) 10 MG tablet Take 10 mg by mouth daily.    . fluticasone (FLONASE) 50 MCG/ACT nasal spray Place 2 sprays into both nostrils daily. 16 g 6  . irbesartan-hydrochlorothiazide (AVALIDE) 300-12.5 MG tablet Take 1 tablet by mouth daily. 90 tablet 3  . MEGARED OMEGA-3 KRILL OIL 500 MG CAPS Take 500 mg by mouth daily.    . Pitavastatin Calcium (LIVALO) 2 MG TABS Take 1 tablet (2 mg total) by mouth daily. 90 tablet 3  . vitamin C (ASCORBIC ACID) 500 MG tablet Take 500 mg by mouth daily.    . vitamin E 400 UNIT capsule Take 400 Units by mouth daily.    [name] HYDROcodone-acetaminophen (NORCO/VICODIN) 5-325 MG tablet Take 1 tablet by mouth every 6 (six) hours as needed for moderate pain. 90 tablet 0  . Methylnaltrexone Bromide (RELISTOR) 150 MG TABS Take 3 tablets by mouth daily. 90 tablet 11  . Multiple Vitamins-Minerals  (ULTRA WOMENS PACK) MISC Take 1 tablet by mouth daily.     No facility-administered medications prior to visit.     ROS Review of Systems  Constitutional: Positive for fatigue. Negative for activity change, appetite change, chills, diaphoresis, fever and unexpected weight change.  HENT: Negative.   Eyes: Negative.   Respiratory: Negative.  Negative for cough, choking, chest tightness, shortness of breath and stridor.   Cardiovascular: Positive for leg swelling. Negative for chest pain and palpitations.  Gastrointestinal: Positive for constipation. Negative for abdominal pain, blood in stool, diarrhea, nausea and vomiting.  Endocrine: Negative.   Genitourinary: Negative.   Musculoskeletal: Positive for arthralgias. Negative for gait problem, joint swelling, myalgias and neck stiffness.  Skin: Negative.  Negative for color change and rash.  Allergic/Immunologic: Negative.   Neurological: Negative.  Negative for dizziness, tremors, seizures, weakness, light-headedness, numbness and headaches.  Hematological: Negative.  Negative for adenopathy. Does not bruise/bleed easily.  Psychiatric/Behavioral: Negative.     Objective:  BP 122/70 (BP Location: Left Arm, Patient Position: Sitting, Cuff Size: Normal)   Pulse (!) 48   Temp 97.8 F (36.6 C) (Oral)   Resp 16   Ht 5\' 10"  (1.778 m)   Wt 246 lb 8 oz (111.8 kg)   SpO2 97%   BMI 35.37 kg/m   BP Readings from Last 3 Encounters:  03/04/16 122/70  01/01/16 130/70  09/02/15 140/80    Wt Readings from Last 3 Encounters:  03/04/16 246 lb  8 oz (111.8 kg)  01/01/16 239 lb (108.4 kg)  09/02/15 246 lb (111.6 kg)    Physical Exam  Constitutional: She is oriented to person, place, and time. No distress.  HENT:  Mouth/Throat: Oropharynx is clear and moist. No oropharyngeal exudate.  Eyes: Conjunctivae are normal. Right eye exhibits no discharge. Left eye exhibits no discharge. No scleral icterus.  Neck: Normal range of motion. Neck  supple. No JVD present. No tracheal deviation present. No thyromegaly present.  Cardiovascular: Normal rate, regular rhythm, normal heart sounds and intact distal pulses.  Exam reveals no gallop and no friction rub.   No murmur heard. Pulmonary/Chest: Effort normal and breath sounds normal. No stridor. No respiratory distress. She has no wheezes. She has no rales. She exhibits no tenderness.  Abdominal: Soft. Bowel sounds are normal. She exhibits no distension and no mass. There is no tenderness. There is no rebound and no guarding.  Musculoskeletal: Normal range of motion. She exhibits no edema, tenderness or deformity.  There is trace pitting edema in both lower extremities, slightly more prominent on the left than the right  Lymphadenopathy:    She has no cervical adenopathy.  Neurological: She is oriented to person, place, and time.  Skin: Skin is warm and dry. No rash noted. She is not diaphoretic. No erythema. No pallor.  Vitals reviewed.   Lab Results  Component Value Date   WBC 4.9 08/23/2015   HGB 13.2 08/23/2015   HCT 40.3 08/23/2015   PLT 179 08/23/2015   GLUCOSE 90 03/04/2016   CHOL 148 08/24/2015   TRIG 53 08/24/2015   HDL 51 08/24/2015   LDLDIRECT 158.9 09/05/2012   LDLCALC 86 08/24/2015   ALT 15 01/01/2016   AST 19 01/01/2016   NA 139 03/04/2016   K 4.8 03/04/2016   CL 106 03/04/2016   CREATININE 1.05 03/04/2016   BUN 22 03/04/2016   CO2 30 03/04/2016   TSH 6.11 (H) 03/04/2016   HGBA1C 5.8 (H) 08/24/2015    US Breast Ltd Uni Left [place]  Result Date: 10/09/2015 CLINICAL DATA:  Possible mass in the 6 o'clock position of the left breast on a recent 2D screening mammogram. EXAM: 2D DIGITAL DIAGNOSTIC LEFT MAMMOGRAM WITH CAD AND ADJUNCT TOMO ULTRASOUND LEFT BREAST COMPARISON:  Previous exam(s). ACR Breast Density Category b: There are scattered areas of fibroglandular density. FINDINGS: 2D and 3D tomographic images of the left breast confirm a small area of  asymmetrical density in the 6 o'clock position of the left breast. No new findings suspicious for malignancy elsewhere in the breast. Mammographic images were processed with CAD. On physical exam, no mass is palpable in the inferior left breast. Targeted ultrasound is performed, showing a 10 x 8 x 2 mm cyst containing inner internal septations in the 5 o'clock position of the left breast, 4 cm from the nipple. No internal blood flow was seen with color Doppler. This corresponds to the mammographic mass. IMPRESSION: Left breast cyst.  No evidence of malignancy. RECOMMENDATION: Bilateral screening mammogram in 1 year. I have discussed the findings and recommendations with the patient. Results were also provided in writing at the conclusion of the visit. If applicable, a reminder letter will be sent to the patient regarding the next appointment. BI-RADS CATEGORY  2: Benign. Electronically Signed   By: Claudie Revering M.D.   On: 10/09/2015 13:34   Mm Diag Breast Tomo Uni Left  Result Date: 10/09/2015 CLINICAL DATA:  Possible mass in the 6 o'clock position  of the left breast on a recent 2D screening mammogram. EXAM: 2D DIGITAL DIAGNOSTIC LEFT MAMMOGRAM WITH CAD AND ADJUNCT TOMO ULTRASOUND LEFT BREAST COMPARISON:  Previous exam(s). ACR Breast Density Category b: There are scattered areas of fibroglandular density. FINDINGS: 2D and 3D tomographic images of the left breast confirm a small area of asymmetrical density in the 6 o'clock position of the left breast. No new findings suspicious for malignancy elsewhere in the breast. Mammographic images were processed with CAD. On physical exam, no mass is palpable in the inferior left breast. Targeted ultrasound is performed, showing a 10 x 8 x 2 mm cyst containing inner internal septations in the 5 o'clock position of the left breast, 4 cm from the nipple. No internal blood flow was seen with color Doppler. This corresponds to the mammographic mass. IMPRESSION: Left breast  cyst.  No evidence of malignancy. RECOMMENDATION: Bilateral screening mammogram in 1 year. I have discussed the findings and recommendations with the patient. Results were also provided in writing at the conclusion of the visit. If applicable, a reminder letter will be sent to the patient regarding the next appointment. BI-RADS CATEGORY  2: Benign. Electronically Signed   By: Claudie Revering M.D.   On: 10/09/2015 13:34    Assessment & Plan:   Ranell was seen today for hypothyroidism, osteoarthritis, constipation and hypertension.  Diagnoses and all orders for this visit:  Need for prophylactic vaccination and inoculation against influenza -     Flu vaccine HIGH DOSE PF (Fluzone High dose)  Bradycardia- she continues to have mild bradycardia, I will order a 48-hour Holter monitor to see if there are pulses are more significant bradycardia and I'm seeing during her office exam. -     TSH; Future -     Holter monitor - 48 hour; Future  Hypothyroidism, unspecified hypothyroidism type- her TSH is slightly elevated but at the age of 76, afraid that she may have an increased risk of events if she starts taking her thyroid replacement therapy so for now will monitor her without starting a thyroid replacement. -     TSH; Future  Therapeutic opioid-induced constipation (OIC) - will try Amitiza for this -     lubiprostone (AMITIZA) 24 MCG capsule; Take 1 capsule (24 mcg total) by mouth 2 (two) times daily with a meal.  Essential hypertension, benign- her blood pressures adequately well-controlled, -     Basic metabolic panel; Future  Leg edema, left- her d-dimer is slightly elevated so I have ordered an ultrasound to see if there is concern for a deep venous thrombosis, for now I do not think she needs to be anticoagulated, her BNP is slightly elevated but less so than before. I suspect that she has some degree of diastolic dysfunction. At this time I don't see the need to be more aggressive with  antihypertensives or diuretics for symptom relief. -     D-dimer, quantitative (not at [facility]); Future -     Brain natriuretic peptide; Future -     VAS [place] LOWER EXTREMITY VENOUS (DVT); Future  Primary osteoarthritis of both knees -     HYDROcodone-acetaminophen (NORCO/VICODIN) 5-325 MG tablet; Take 1 tablet by mouth every 6 (six) hours as needed for moderate pain.  Postlaminectomy syndrome, lumbar region -     HYDROcodone-acetaminophen (NORCO/VICODIN) 5-325 MG tablet; Take 1 tablet by mouth every 6 (six) hours as needed for moderate pain.  D-dimer, elevated -     VAS [place] LOWER EXTREMITY VENOUS (DVT); Future  I have discontinued Ms. Yau's ULTRA WOMENS PACK and Methylnaltrexone Bromide. I am also having her start on lubiprostone. Additionally, I am having her maintain her cetirizine, aspirin, fluticasone, vitamin C, vitamin E, MEGARED OMEGA-3 KRILL OIL, Pitavastatin Calcium, irbesartan-hydrochlorothiazide, and HYDROcodone-acetaminophen.  Meds ordered this encounter  Medications  . lubiprostone (AMITIZA) 24 MCG capsule    Sig: Take 1 capsule (24 mcg total) by mouth 2 (two) times daily with a meal.    Dispense:  180 capsule    Refill:  3  . HYDROcodone-acetaminophen (NORCO/VICODIN) 5-325 MG tablet    Sig: Take 1 tablet by mouth every 6 (six) hours as needed for moderate pain.    Dispense:  90 tablet    Refill:  0    Fill on or after 03/04/16     Follow-up: Return in about 6 weeks (around 04/15/2016).  [name], MD

## 2016-03-04 NOTE — Progress Notes (Signed)
Pre visit review using our clinic review tool, if applicable. No additional management support is needed unless otherwise documented below in the visit note. 

## 2016-03-05 DIAGNOSIS — R7989 Other specified abnormal findings of blood chemistry: Secondary | ICD-10-CM | POA: Insufficient documentation

## 2016-03-10 ENCOUNTER — Ambulatory Visit (HOSPITAL_COMMUNITY)
Admission: RE | Admit: 2016-03-10 | Discharge: 2016-03-10 | Disposition: A | Discharge status: Home / Self Care | Payer: PPO | Source: Ambulatory Visit | Attending: Internal Medicine | Admitting: Internal Medicine

## 2016-03-10 DIAGNOSIS — R600 Localized edema: Secondary | ICD-10-CM | POA: Insufficient documentation

## 2016-03-10 DIAGNOSIS — R791 Abnormal coagulation profile: Secondary | ICD-10-CM | POA: Diagnosis not present

## 2016-03-10 DIAGNOSIS — R7989 Other specified abnormal findings of blood chemistry: Secondary | ICD-10-CM

## 2016-03-10 NOTE — Progress Notes (Signed)
Preliminary results by tech - Venous Duplex Lower Ext. Completed. Negative for deep and superficial vein thrombosis in both legs.  Miangel Flom, BS, RDMS, RVT  

## 2016-03-16 ENCOUNTER — Ambulatory Visit (INDEPENDENT_AMBULATORY_CARE_PROVIDER_SITE_OTHER): Payer: PPO

## 2016-03-16 DIAGNOSIS — R001 Bradycardia, unspecified: Secondary | ICD-10-CM | POA: Diagnosis not present

## 2016-03-17 ENCOUNTER — Telehealth: Payer: Self-pay

## 2016-03-17 NOTE — Telephone Encounter (Signed)
Patient called saying her has a bad cold and her ear is hurting really bad. She doesn't want to make an app because she was here over a week ago. I informed her this is for something different so she may have to make an app. But she would just like for him to call her an rx in for this. Please advise or follow up, Thank you.

## 2016-03-18 ENCOUNTER — Encounter: Payer: Self-pay | Admitting: Nurse Practitioner

## 2016-03-18 ENCOUNTER — Ambulatory Visit (INDEPENDENT_AMBULATORY_CARE_PROVIDER_SITE_OTHER): Payer: PPO | Admitting: Nurse Practitioner

## 2016-03-18 VITALS — BP 142/88 | HR 92 | Temp 98.3°F | Ht 70.0 in | Wt 246.0 lb

## 2016-03-18 DIAGNOSIS — J019 Acute sinusitis, unspecified: Secondary | ICD-10-CM | POA: Diagnosis not present

## 2016-03-18 DIAGNOSIS — J209 Acute bronchitis, unspecified: Secondary | ICD-10-CM | POA: Diagnosis not present

## 2016-03-18 MED ORDER — ALBUTEROL SULFATE HFA 108 (90 BASE) MCG/ACT IN AERS: 2.0000 | INHALATION_SPRAY | Freq: Four times a day (QID) | RESPIRATORY_TRACT | 0 refills | Status: DC | PRN

## 2016-03-18 MED ORDER — GUAIFENESIN ER 600 MG PO TB12: 600.0000 mg | ORAL_TABLET | Freq: Two times a day (BID) | ORAL | 0 refills | Status: DC | PRN

## 2016-03-18 MED ORDER — IPRATROPIUM BROMIDE 0.03 % NA SOLN: 2.0000 | Freq: Two times a day (BID) | NASAL | 12 refills | Status: DC

## 2016-03-18 MED ORDER — BENZONATATE 100 MG PO CAPS: 100.0000 mg | ORAL_CAPSULE | Freq: Three times a day (TID) | ORAL | 0 refills | Status: DC | PRN

## 2016-03-18 MED ORDER — AMOXICILLIN-POT CLAVULANATE 875-125 MG PO TABS: 1.0000 | ORAL_TABLET | Freq: Two times a day (BID) | ORAL | 0 refills | Status: DC

## 2016-03-18 NOTE — Patient Instructions (Addendum)
URI Instructions: Use over-the-counter  "cold" medicines  such as "Tylenol cold" , "Advil cold",  "Mucinex" or" Mucinex D"  for cough and congestion.  Avoid decongestants if you have high blood pressure. Use" Delsym" or" Robitussin" cough syrup varietis for cough.  You can use plain "Tylenol" or "Advi"l for fever, chills and achyness.   "Common cold" symptoms are usually triggered by a virus.  The antibiotics are usually not necessary. On average, a" viral cold" illness would take 4-7 days to resolve. Please, make an appointment if you are not better or if you're worse.

## 2016-03-18 NOTE — Progress Notes (Signed)
Subjective:  Patient ID: Claudia [name], female    DOB: 1933-11-02  Age: 82 y.o. MRN: [id number]  CC: Cough (Pt stated coughing, and pain in the ear for about 4days.)   Cough  This is a new problem. The current episode started in the past 7 days. The problem has been rapidly worsening. The problem occurs constantly. The cough is productive of sputum. Associated symptoms include chills, headaches, nasal congestion, postnasal drip, rhinorrhea and a sore throat. Pertinent negatives include no chest pain, ear congestion, ear pain, fever, heartburn, hemoptysis, myalgias, shortness of breath or wheezing. Associated symptoms comments: Yellow Nasal drainage. The symptoms are aggravated by lying down. She has tried nothing for the symptoms. Her past medical history is significant for environmental allergies.    Outpatient Medications Prior to Visit  Medication Sig Dispense Refill  . aspirin 81 MG tablet Take 81 mg by mouth daily.    . cetirizine (ZYRTEC) 10 MG tablet Take 10 mg by mouth daily.    [name] HYDROcodone-acetaminophen (NORCO/VICODIN) 5-325 MG tablet Take 1 tablet by mouth every 6 (six) hours as needed for moderate pain. 90 tablet 0  . irbesartan-hydrochlorothiazide (AVALIDE) 300-12.5 MG tablet Take 1 tablet by mouth daily. 90 tablet 3  . lubiprostone (AMITIZA) 24 MCG capsule Take 1 capsule (24 mcg total) by mouth 2 (two) times daily with a meal. 180 capsule 3  . MEGARED OMEGA-3 KRILL OIL 500 MG CAPS Take 500 mg by mouth daily.    . Pitavastatin Calcium (LIVALO) 2 MG TABS Take 1 tablet (2 mg total) by mouth daily. 90 tablet 3  . vitamin C (ASCORBIC ACID) 500 MG tablet Take 500 mg by mouth daily.    . vitamin E 400 UNIT capsule Take 400 Units by mouth daily.    . fluticasone (FLONASE) 50 MCG/ACT nasal spray Place 2 sprays into both nostrils daily. 16 g 6   No facility-administered medications prior to visit.     ROS See HPI  Objective:  BP (!) 142/88   Pulse 92   Temp 98.3 F (36.8  C)   Ht 5\' 10"  (1.778 m)   Wt 246 lb (111.6 kg)   SpO2 94%   BMI 35.30 kg/m   BP Readings from Last 3 Encounters:  03/18/16 (!) 142/88  03/04/16 122/70  01/01/16 130/70    Wt Readings from Last 3 Encounters:  03/18/16 246 lb (111.6 kg)  03/04/16 246 lb 8 oz (111.8 kg)  01/01/16 239 lb (108.4 kg)    Physical Exam  Constitutional: She is oriented to person, place, and time. No distress.  HENT:  Right Ear: External ear normal. Tympanic membrane is injected. A middle ear effusion is present.  Left Ear: External ear normal. Tympanic membrane is injected. A middle ear effusion is present.  Nose: Mucosal edema and rhinorrhea present. Right sinus exhibits maxillary sinus tenderness and frontal sinus tenderness. Left sinus exhibits maxillary sinus tenderness and frontal sinus tenderness.  Mouth/Throat: Uvula is midline. Posterior oropharyngeal erythema present. No oropharyngeal exudate.  Eyes: No scleral icterus.  Neck: Normal range of motion. Neck supple.  Cardiovascular: Normal rate, regular rhythm and normal heart sounds.   Pulmonary/Chest: Effort normal and breath sounds normal. No respiratory distress.  Abdominal: Soft. She exhibits no distension.  Lymphadenopathy:    She has cervical adenopathy.  Neurological: She is alert and oriented to person, place, and time.  Skin: Skin is warm and dry.  Psychiatric: She has a normal mood and affect. Her behavior is normal.  Lab Results  Component Value Date   WBC 4.9 08/23/2015   HGB 13.2 08/23/2015   HCT 40.3 08/23/2015   PLT 179 08/23/2015   GLUCOSE 90 03/04/2016   CHOL 148 08/24/2015   TRIG 53 08/24/2015   HDL 51 08/24/2015   LDLDIRECT 158.9 09/05/2012   LDLCALC 86 08/24/2015   ALT 15 01/01/2016   AST 19 01/01/2016   NA 139 03/04/2016   K 4.8 03/04/2016   CL 106 03/04/2016   CREATININE 1.05 03/04/2016   BUN 22 03/04/2016   CO2 30 03/04/2016   TSH 6.11 (H) 03/04/2016   HGBA1C 5.8 (H) 08/24/2015    No results  found.  Assessment & Plan:   Claudia [name] was seen today for cough.  Diagnoses and all orders for this visit:  Acute bronchitis, unspecified organism -     benzonatate (TESSALON) 100 MG capsule; Take 1 capsule (100 mg total) by mouth 3 (three) times daily as needed for cough. -     ipratropium (ATROVENT) 0.03 % nasal spray; Place 2 sprays into both nostrils every 12 (twelve) hours. -     guaiFENesin (MUCINEX) 600 MG 12 hr tablet; Take 1 tablet (600 mg total) by mouth 2 (two) times daily as needed for cough or to loosen phlegm. -     amoxicillin-clavulanate (AUGMENTIN) 875-125 MG tablet; Take 1 tablet by mouth 2 (two) times daily. -     albuterol (PROVENTIL HFA;VENTOLIN HFA) 108 (90 Base) MCG/ACT inhaler; Inhale 2 puffs into the lungs every 6 (six) hours as needed for wheezing or shortness of breath.  Acute sinusitis, recurrence not specified, unspecified location -     benzonatate (TESSALON) 100 MG capsule; Take 1 capsule (100 mg total) by mouth 3 (three) times daily as needed for cough. -     ipratropium (ATROVENT) 0.03 % nasal spray; Place 2 sprays into both nostrils every 12 (twelve) hours. -     guaiFENesin (MUCINEX) 600 MG 12 hr tablet; Take 1 tablet (600 mg total) by mouth 2 (two) times daily as needed for cough or to loosen phlegm. -     amoxicillin-clavulanate (AUGMENTIN) 875-125 MG tablet; Take 1 tablet by mouth 2 (two) times daily.   I have discontinued [name]'s fluticasone. I am also having her start on benzonatate, ipratropium, guaiFENesin, amoxicillin-clavulanate, and albuterol. Additionally, I am having her maintain her cetirizine, aspirin, vitamin C, vitamin E, MEGARED OMEGA-3 KRILL OIL, Pitavastatin Calcium, irbesartan-hydrochlorothiazide, lubiprostone, HYDROcodone-acetaminophen, and RELISTOR.  Meds ordered this encounter  Medications  . RELISTOR 150 MG TABS    Refill:  1  . benzonatate (TESSALON) 100 MG capsule    Sig: Take 1 capsule (100 mg total) by mouth 3 (three)  times daily as needed for cough.    Dispense:  20 capsule    Refill:  0    Order Specific Question:   Supervising Provider    Answer:   Cassandria Anger [[id number]]  . ipratropium (ATROVENT) 0.03 % nasal spray    Sig: Place 2 sprays into both nostrils every 12 (twelve) hours.    Dispense:  30 mL    Refill:  12    Order Specific Question:   Supervising Provider    Answer:   Cassandria Anger [[id number]]  . guaiFENesin (MUCINEX) 600 MG 12 hr tablet    Sig: Take 1 tablet (600 mg total) by mouth 2 (two) times daily as needed for cough or to loosen phlegm.    Dispense:  14 tablet    Refill:  0    Order Specific Question:   Supervising Provider    Answer:   Cassandria Anger [[id number]]  . amoxicillin-clavulanate (AUGMENTIN) 875-125 MG tablet    Sig: Take 1 tablet by mouth 2 (two) times daily.    Dispense:  14 tablet    Refill:  0    Order Specific Question:   Supervising Provider    Answer:   Cassandria Anger [[id number]]  . albuterol (PROVENTIL HFA;VENTOLIN HFA) 108 (90 Base) MCG/ACT inhaler    Sig: Inhale 2 puffs into the lungs every 6 (six) hours as needed for wheezing or shortness of breath.    Dispense:  1 Inhaler    Refill:  0    Order Specific Question:   Supervising Provider    Answer:   Cassandria Anger [[id number]]    Follow-up: Return if symptoms worsen or fail to improve.  Wilfred Lacy, NP

## 2016-03-18 NOTE — Telephone Encounter (Signed)
Pt states she is afebrile, productive cough. Sx have been persistent since Monday. Also, Rt ear pain without drainage. Pt has made an appt for today at 1pm with [facility]

## 2016-03-25 ENCOUNTER — Other Ambulatory Visit: Payer: Self-pay | Admitting: Internal Medicine

## 2016-03-25 DIAGNOSIS — R001 Bradycardia, unspecified: Secondary | ICD-10-CM

## 2016-03-26 ENCOUNTER — Encounter: Payer: Self-pay | Admitting: Internal Medicine

## 2016-03-29 ENCOUNTER — Encounter: Payer: Self-pay | Admitting: Internal Medicine

## 2016-03-29 ENCOUNTER — Ambulatory Visit (INDEPENDENT_AMBULATORY_CARE_PROVIDER_SITE_OTHER): Payer: PPO | Admitting: Internal Medicine

## 2016-03-29 VITALS — BP 136/64 | HR 51 | Ht 70.0 in | Wt 246.2 lb

## 2016-03-29 DIAGNOSIS — R601 Generalized edema: Secondary | ICD-10-CM | POA: Diagnosis not present

## 2016-03-29 NOTE — Patient Instructions (Signed)
Your physician recommends that you continue on your current medications as directed. Please refer to the Current Medication list given to you today.  Your physician recommends that you return for lab work today (TSH, free T4, free T3, BNP)  Your physician has requested that you have an echocardiogram. Echocardiography is a painless test that uses sound waves to create images of your heart. It provides your doctor with information about the size and shape of your heart and how well your heart's chambers and valves are working. This procedure takes approximately one hour. There are no restrictions for this procedure.  Your physician recommends that you schedule a follow-up appointment as needed with Dr. Harrington Challenger.

## 2016-03-29 NOTE — Progress Notes (Signed)
Cardiology Office Note   Date:  03/29/2016   ID:  Claudia [name], DOB 08-Feb-1934, MRN [id number]  PCP:  [name], MD  Cardiologist:   Dorris Carnes, MD   Pt referred for bradycardia      History of Present Illness: Claudia [name] is a 82 y.o. female who was referred for bradycardia  Th pt is followed in [place] [facility] by Alona Bene  She was seen in early September   Complained of LE edema  No CP No SOB  Found to be bradycardiac at the time  Also complained of fatgiue at time  She wore a holter monitir  This showed  HR from 32 to 91 bpm   Average HR 58 bpm   Longest pause 2.4 sec  Rare PAC and PVC  Slow HR in middle of night  The patient deies dizziness       Outpatient Medications Prior to Visit  Medication Sig Dispense Refill  . aspirin 81 MG tablet Take 81 mg by mouth daily.    . cetirizine (ZYRTEC) 10 MG tablet Take 10 mg by mouth daily.    [name] HYDROcodone-acetaminophen (NORCO/VICODIN) 5-325 MG tablet Take 1 tablet by mouth every 6 (six) hours as needed for moderate pain. 90 tablet 0  . ipratropium (ATROVENT) 0.03 % nasal spray Place 2 sprays into both nostrils every 12 (twelve) hours. 30 mL 12  . irbesartan-hydrochlorothiazide (AVALIDE) 300-12.5 MG tablet Take 1 tablet by mouth daily. 90 tablet 3  . lubiprostone (AMITIZA) 24 MCG capsule Take 1 capsule (24 mcg total) by mouth 2 (two) times daily with a meal. 180 capsule 3  . MEGARED OMEGA-3 KRILL OIL 500 MG CAPS Take 500 mg by mouth daily.    . Pitavastatin Calcium (LIVALO) 2 MG TABS Take 1 tablet (2 mg total) by mouth daily. 90 tablet 3  . vitamin C (ASCORBIC ACID) 500 MG tablet Take 500 mg by mouth daily.    . vitamin E 400 UNIT capsule Take 400 Units by mouth daily.    [name] albuterol (PROVENTIL HFA;VENTOLIN HFA) 108 (90 Base) MCG/ACT inhaler Inhale 2 puffs into the lungs every 6 (six) hours as needed for wheezing or shortness of breath. (Patient not taking: Reported on 03/29/2016) 1 Inhaler 0  . amoxicillin-clavulanate  (AUGMENTIN) 875-125 MG tablet Take 1 tablet by mouth 2 (two) times daily. (Patient not taking: Reported on 03/29/2016) 14 tablet 0  . benzonatate (TESSALON) 100 MG capsule Take 1 capsule (100 mg total) by mouth 3 (three) times daily as needed for cough. (Patient not taking: Reported on 03/29/2016) 20 capsule 0  . guaiFENesin (MUCINEX) 600 MG 12 hr tablet Take 1 tablet (600 mg total) by mouth 2 (two) times daily as needed for cough or to loosen phlegm. (Patient not taking: Reported on 03/29/2016) 14 tablet 0  . RELISTOR 150 MG TABS   1   No facility-administered medications prior to visit.      Allergies:   Lipitor [atorvastatin] and Trileptal [oxcarbazepine]   Past Medical History:  Diagnosis Date  . Abnormality of gait   . Brachial neuritis or radiculitis NOS   . Degeneration of lumbar or lumbosacral intervertebral disc   . Essential hypertension, benign   . Gouty arthropathy   . Lumbago   . Obesity   . Osteoarthritis   . Pure hypercholesterolemia   . Unspecified hypothyroidism     Past Surgical History:  Procedure Laterality Date  . ABDOMINAL HYSTERECTOMY  03/13/2010  . BACK SURGERY    .  LUMBAR LAMINECTOMY  03/13/2010  . right knee replacement  03/13/2010     Social History:  The patient  reports that she has never smoked. She has never used smokeless tobacco. She reports that she does not drink alcohol or use drugs.   Family History:  The patient's family history includes Alzheimer's disease in her mother; Cirrhosis in her brother; Diabetes in her sister; Heart disease in her father; Hypertension in her mother.    ROS:  Please see the history of present illness. All other systems are reviewed and  Negative to the above problem except as noted.    PHYSICAL EXAM: VS:  BP 136/64   Pulse (!) 51   Ht 5\' 10"  (1.778 m)   Wt 246 lb 3.2 oz (111.7 kg)   SpO2 99%   BMI 35.33 kg/m   GEN: Well nourished, well developed, in no acute distress  HEENT: normal  Neck: no JVD, carotid  bruits, or masses Cardiac: RRR; no murmurs, rubs, or gallops,  Tr  edema  Respiratory:  clear to auscultation bilaterally, normal work of breathing GI: soft, nontender, nondistended, + BS  No hepatomegaly  MS: no deformity Moving all extremities   Skin: warm and dry, no rash Neuro:  Strength and sensation are intact Psych: euthymic mood, full affect   EKG:  EKG is not  ordered today.  IN July 2017  SB  Normal conduction intervals     Lipid Panel    Component Value Date/Time   CHOL 148 08/24/2015 0205   TRIG 53 08/24/2015 0205   HDL 51 08/24/2015 0205   CHOLHDL 2.9 08/24/2015 0205   VLDL 11 08/24/2015 0205   LDLCALC 86 08/24/2015 0205   LDLDIRECT 158.9 09/05/2012 1038      Wt Readings from Last 3 Encounters:  03/29/16 246 lb 3.2 oz (111.7 kg)  03/18/16 246 lb (111.6 kg)  03/04/16 246 lb 8 oz (111.8 kg)      ASSESSMENT AND PLAN: 1  Bradycardia  Pt with mild bradycardia  I am not convinced that it is leading to any symptoms of fatigue   There are no signif pauses  ON exam, she denies dizziness HR today 51 bpm  BP is adequate     I would recomm following   Could consider a sleep study but bradycardia can occur without apnea I would follow  Check TFTs  Follow BP  Follow for symptoms of dizziness  2  Fatigue  Will set up for echo to evla LVEF   Will check BNP as well    F/U based on test results     Current medicines are reviewed at length with the patient today.  The patient does not have concerns regarding medicines.  The following changes have been made:   Labs/ tests ordered today include: No orders of the defined types were placed in this encounter.    Disposition:   FU with  in   Signed, Dorris Carnes, MD  03/29/2016 4:09 PM    [facility] [address], [place], [place]  [address] Phone: [phone]; Fax: [phone]

## 2016-03-30 ENCOUNTER — Other Ambulatory Visit: Payer: Self-pay

## 2016-03-30 ENCOUNTER — Ambulatory Visit (HOSPITAL_COMMUNITY): Payer: PPO | Attending: Cardiovascular Disease

## 2016-03-30 DIAGNOSIS — I517 Cardiomegaly: Secondary | ICD-10-CM | POA: Insufficient documentation

## 2016-03-30 DIAGNOSIS — I351 Nonrheumatic aortic (valve) insufficiency: Secondary | ICD-10-CM | POA: Diagnosis not present

## 2016-03-30 DIAGNOSIS — R601 Generalized edema: Secondary | ICD-10-CM | POA: Insufficient documentation

## 2016-03-30 LAB — T4, FREE: FREE T4: 1.2 ng/dL (ref 0.8–1.8)

## 2016-03-30 LAB — T3, FREE: T3 FREE: 2.7 pg/mL (ref 2.3–4.2)

## 2016-03-30 LAB — TSH: TSH: 2.91 mIU/L

## 2016-03-30 LAB — BRAIN NATRIURETIC PEPTIDE: Brain Natriuretic Peptide: 123.8 pg/mL — ABNORMAL HIGH (ref <100)

## 2016-04-15 ENCOUNTER — Ambulatory Visit (INDEPENDENT_AMBULATORY_CARE_PROVIDER_SITE_OTHER): Payer: PPO | Admitting: Internal Medicine

## 2016-04-15 ENCOUNTER — Encounter: Payer: Self-pay | Admitting: Internal Medicine

## 2016-04-15 VITALS — BP 130/66 | HR 64 | Temp 97.9°F | Resp 16 | Ht 70.0 in | Wt 245.2 lb

## 2016-04-15 DIAGNOSIS — M961 Postlaminectomy syndrome, not elsewhere classified: Secondary | ICD-10-CM | POA: Diagnosis not present

## 2016-04-15 DIAGNOSIS — M47817 Spondylosis without myelopathy or radiculopathy, lumbosacral region: Secondary | ICD-10-CM

## 2016-04-15 DIAGNOSIS — I10 Essential (primary) hypertension: Secondary | ICD-10-CM

## 2016-04-15 DIAGNOSIS — M170 Bilateral primary osteoarthritis of knee: Secondary | ICD-10-CM

## 2016-04-15 MED ORDER — HYDROCODONE-ACETAMINOPHEN 5-325 MG PO TABS: 1.0000 | ORAL_TABLET | Freq: Four times a day (QID) | ORAL | 0 refills | Status: DC | PRN

## 2016-04-15 NOTE — Progress Notes (Signed)
Pre visit review using our clinic review tool, if applicable. No additional management support is needed unless otherwise documented below in the visit note. 

## 2016-04-15 NOTE — Patient Instructions (Signed)
Hypertension Hypertension, commonly called high blood pressure, is when the force of blood pumping through your arteries is too strong. Your arteries are the blood vessels that carry blood from your heart throughout your body. A blood pressure reading consists of a higher number over a lower number, such as 110/72. The higher number (systolic) is the pressure inside your arteries when your heart pumps. The lower number (diastolic) is the pressure inside your arteries when your heart relaxes. Ideally you want your blood pressure below 120/80. Hypertension forces your heart to work harder to pump blood. Your arteries may become narrow or stiff. Having untreated or uncontrolled hypertension can cause heart attack, stroke, kidney disease, and other problems. RISK FACTORS Some risk factors for high blood pressure are controllable. Others are not.  Risk factors you cannot control include:   Race. You may be at higher risk if you are African American.  Age. Risk increases with age.  Gender. Men are at higher risk than women before age 45 years. After age 65, women are at higher risk than men. Risk factors you can control include:  Not getting enough exercise or physical activity.  Being overweight.  Getting too much fat, sugar, calories, or salt in your diet.  Drinking too much alcohol. SIGNS AND SYMPTOMS Hypertension does not usually cause signs or symptoms. Extremely high blood pressure (hypertensive crisis) may cause headache, anxiety, shortness of breath, and nosebleed. DIAGNOSIS To check if you have hypertension, your health care provider will measure your blood pressure while you are seated, with your arm held at the level of your heart. It should be measured at least twice using the same arm. Certain conditions can cause a difference in blood pressure between your right and left arms. A blood pressure reading that is higher than normal on one occasion does not mean that you need treatment. If  it is not clear whether you have high blood pressure, you may be asked to return on a different day to have your blood pressure checked again. Or, you may be asked to monitor your blood pressure at home for 1 or more weeks. TREATMENT Treating high blood pressure includes making lifestyle changes and possibly taking medicine. Living a healthy lifestyle can help lower high blood pressure. You may need to change some of your habits. Lifestyle changes may include:  Following the DASH diet. This diet is high in fruits, vegetables, and whole grains. It is low in salt, red meat, and added sugars.  Keep your sodium intake below 2,300 mg per day.  Getting at least 30-45 minutes of aerobic exercise at least 4 times per week.  Losing weight if necessary.  Not smoking.  Limiting alcoholic beverages.  Learning ways to reduce stress. Your health care provider may prescribe medicine if lifestyle changes are not enough to get your blood pressure under control, and if one of the following is true:  You are 18-59 years of age and your systolic blood pressure is above 140.  You are 60 years of age or older, and your systolic blood pressure is above 150.  Your diastolic blood pressure is above 90.  You have diabetes, and your systolic blood pressure is over 140 or your diastolic blood pressure is over 90.  You have kidney disease and your blood pressure is above 140/90.  You have heart disease and your blood pressure is above 140/90. Your personal target blood pressure may vary depending on your medical conditions, your age, and other factors. HOME CARE INSTRUCTIONS    Have your blood pressure rechecked as directed by your health care provider.   Take medicines only as directed by your health care provider. Follow the directions carefully. Blood pressure medicines must be taken as prescribed. The medicine does not work as well when you skip doses. Skipping doses also puts you at risk for  problems.  Do not smoke.   Monitor your blood pressure at home as directed by your health care provider. SEEK MEDICAL CARE IF:   You think you are having a reaction to medicines taken.  You have recurrent headaches or feel dizzy.  You have swelling in your ankles.  You have trouble with your vision. SEEK IMMEDIATE MEDICAL CARE IF:  You develop a severe headache or confusion.  You have unusual weakness, numbness, or feel faint.  You have severe chest or abdominal pain.  You vomit repeatedly.  You have trouble breathing. MAKE SURE YOU:   Understand these instructions.  Will watch your condition.  Will get help right away if you are not doing well or get worse.   This information is not intended to replace advice given to you by your health care provider. Make sure you discuss any questions you have with your health care provider.   Document Released: 06/14/2005 Document Revised: 10/29/2014 Document Reviewed: 04/06/2013 Else[employer] Interactive Patient Education 2016 [employer]  

## 2016-04-15 NOTE — Progress Notes (Signed)
Subjective:  Patient ID: Claudia [name], female    DOB: Aug 04, 1933  Age: 82 y.o. MRN: [id number]  CC: Hypertension and Osteoarthritis   HPI Claudia [name] presents for a BP check and refills on meds for chronic OA and LBP. Since I saw her she was seen by cardiology for lower extremity edema and her cardiac evaluation was normal including an EKG and echocardiogram. She tells me that her blood pressure has been well controlled and her lower extremity edema has resolved.  Outpatient Medications Prior to Visit  Medication Sig Dispense Refill  . aspirin 81 MG tablet Take 81 mg by mouth daily.    . cetirizine (ZYRTEC) 10 MG tablet Take 10 mg by mouth daily.    [name] ipratropium (ATROVENT) 0.03 % nasal spray Place 2 sprays into both nostrils every 12 (twelve) hours. 30 mL 12  . irbesartan-hydrochlorothiazide (AVALIDE) 300-12.5 MG tablet Take 1 tablet by mouth daily. 90 tablet 3  . lubiprostone (AMITIZA) 24 MCG capsule Take 1 capsule (24 mcg total) by mouth 2 (two) times daily with a meal. 180 capsule 3  . MEGARED OMEGA-3 KRILL OIL 500 MG CAPS Take 500 mg by mouth daily.    . Pitavastatin Calcium (LIVALO) 2 MG TABS Take 1 tablet (2 mg total) by mouth daily. 90 tablet 3  . vitamin C (ASCORBIC ACID) 500 MG tablet Take 500 mg by mouth daily.    . vitamin E 400 UNIT capsule Take 400 Units by mouth daily.    [name] HYDROcodone-acetaminophen (NORCO/VICODIN) 5-325 MG tablet Take 1 tablet by mouth every 6 (six) hours as needed for moderate pain. 90 tablet 0   No facility-administered medications prior to visit.     ROS Review of Systems  Constitutional: Negative.  Negative for appetite change, chills, fatigue and fever.  HENT: Negative.  Negative for trouble swallowing.   Eyes: Negative.   Respiratory: Negative.  Negative for cough, choking, chest tightness, shortness of breath and stridor.   Cardiovascular: Negative for chest pain, palpitations and leg swelling.  Gastrointestinal: Negative.   Negative for abdominal pain, constipation, diarrhea, nausea and vomiting.  Endocrine: Negative.   Genitourinary: Negative.   Musculoskeletal: Positive for arthralgias and back pain. Negative for neck pain.  Skin: Negative.   Allergic/Immunologic: Negative.   Neurological: Negative.  Negative for dizziness.  Hematological: Negative for adenopathy. Does not bruise/bleed easily.  Psychiatric/Behavioral: Negative.     Objective:  BP 130/66 (BP Location: Left Arm, Patient Position: Sitting, Cuff Size: Normal)   Pulse 64   Temp 97.9 F (36.6 C) (Oral)   Resp 16   Ht 5\' 10"  (1.778 m)   Wt 245 lb 4 oz (111.2 kg)   SpO2 95%   BMI 35.19 kg/m   BP Readings from Last 3 Encounters:  04/15/16 130/66  03/29/16 136/64  03/18/16 (!) 142/88    Wt Readings from Last 3 Encounters:  04/15/16 245 lb 4 oz (111.2 kg)  03/29/16 246 lb 3.2 oz (111.7 kg)  03/18/16 246 lb (111.6 kg)    Physical Exam  Constitutional: She is oriented to person, place, and time. No distress.  HENT:  Mouth/Throat: Oropharynx is clear and moist. No oropharyngeal exudate.  Eyes: Conjunctivae are normal. Right eye exhibits no discharge. Left eye exhibits no discharge. No scleral icterus.  Neck: Normal range of motion. Neck supple. No JVD present. No tracheal deviation present. No thyromegaly present.  Cardiovascular: Normal rate, regular rhythm, normal heart sounds and intact distal pulses.  Exam reveals no  gallop and no friction rub.   No murmur heard. Pulmonary/Chest: Effort normal and breath sounds normal. No stridor. No respiratory distress. She has no wheezes. She has no rales. She exhibits no tenderness.  Abdominal: Soft. Bowel sounds are normal. She exhibits no distension and no mass. There is no tenderness. There is no rebound and no guarding.  Musculoskeletal: Normal range of motion. She exhibits no edema, tenderness or deformity.  Lymphadenopathy:    She has no cervical adenopathy.  Neurological: She is  oriented to person, place, and time.  Skin: Skin is warm and dry. No rash noted. She is not diaphoretic. No erythema. No pallor.  Vitals reviewed.   Lab Results  Component Value Date   WBC 4.9 08/23/2015   HGB 13.2 08/23/2015   HCT 40.3 08/23/2015   PLT 179 08/23/2015   GLUCOSE 90 03/04/2016   CHOL 148 08/24/2015   TRIG 53 08/24/2015   HDL 51 08/24/2015   LDLDIRECT 158.9 09/05/2012   LDLCALC 86 08/24/2015   ALT 15 01/01/2016   AST 19 01/01/2016   NA 139 03/04/2016   K 4.8 03/04/2016   CL 106 03/04/2016   CREATININE 1.05 03/04/2016   BUN 22 03/04/2016   CO2 30 03/04/2016   TSH 2.91 03/29/2016   HGBA1C 5.8 (H) 08/24/2015    No results found.  Assessment & Plan:   Claudia [name] was seen today for hypertension and osteoarthritis.  Diagnoses and all orders for this visit:  Essential hypertension, benign- her blood pressure is well-controlled, electrolytes and renal function are stable.  Postlaminectomy syndrome, lumbar region -     Discontinue: HYDROcodone-acetaminophen (NORCO/VICODIN) 5-325 MG tablet; Take 1 tablet by mouth every 6 (six) hours as needed for moderate pain. -     Discontinue: HYDROcodone-acetaminophen (NORCO/VICODIN) 5-325 MG tablet; Take 1 tablet by mouth every 6 (six) hours as needed for moderate pain. -     HYDROcodone-acetaminophen (NORCO/VICODIN) 5-325 MG tablet; Take 1 tablet by mouth every 6 (six) hours as needed for moderate pain.  Primary osteoarthritis of both knees -     Discontinue: HYDROcodone-acetaminophen (NORCO/VICODIN) 5-325 MG tablet; Take 1 tablet by mouth every 6 (six) hours as needed for moderate pain. -     Discontinue: HYDROcodone-acetaminophen (NORCO/VICODIN) 5-325 MG tablet; Take 1 tablet by mouth every 6 (six) hours as needed for moderate pain. -     HYDROcodone-acetaminophen (NORCO/VICODIN) 5-325 MG tablet; Take 1 tablet by mouth every 6 (six) hours as needed for moderate pain.  Lumbosacral spondylosis without myelopathy -      Discontinue: HYDROcodone-acetaminophen (NORCO/VICODIN) 5-325 MG tablet; Take 1 tablet by mouth every 6 (six) hours as needed for moderate pain. -     Discontinue: HYDROcodone-acetaminophen (NORCO/VICODIN) 5-325 MG tablet; Take 1 tablet by mouth every 6 (six) hours as needed for moderate pain. -     HYDROcodone-acetaminophen (NORCO/VICODIN) 5-325 MG tablet; Take 1 tablet by mouth every 6 (six) hours as needed for moderate pain.   I am having Claudia [name] maintain her cetirizine, aspirin, vitamin C, vitamin E, MEGARED OMEGA-3 KRILL OIL, Pitavastatin Calcium, irbesartan-hydrochlorothiazide, lubiprostone, ipratropium, and HYDROcodone-acetaminophen.  Meds ordered this encounter  Medications  . DISCONTD: HYDROcodone-acetaminophen (NORCO/VICODIN) 5-325 MG tablet    Sig: Take 1 tablet by mouth every 6 (six) hours as needed for moderate pain.    Dispense:  90 tablet    Refill:  0    Fill on or after 04/15/16  . DISCONTD: HYDROcodone-acetaminophen (NORCO/VICODIN) 5-325 MG tablet    Sig: Take 1 tablet  by mouth every 6 (six) hours as needed for moderate pain.    Dispense:  90 tablet    Refill:  0    Fill on or after 05/16/16  . HYDROcodone-acetaminophen (NORCO/VICODIN) 5-325 MG tablet    Sig: Take 1 tablet by mouth every 6 (six) hours as needed for moderate pain.    Dispense:  90 tablet    Refill:  0    Fill on or after 06/15/16     Follow-up: Return in about 4 months (around 08/16/2016).  [name], MD

## 2016-05-03 ENCOUNTER — Emergency Department (HOSPITAL_COMMUNITY)
Admission: EM | Admit: 2016-05-03 | Discharge: 2016-05-03 | Disposition: A | Discharge status: Home / Self Care | Payer: PPO | Attending: Emergency Medicine | Admitting: Emergency Medicine

## 2016-05-03 ENCOUNTER — Encounter (HOSPITAL_COMMUNITY): Payer: Self-pay

## 2016-05-03 ENCOUNTER — Emergency Department (HOSPITAL_COMMUNITY): Payer: PPO

## 2016-05-03 DIAGNOSIS — I10 Essential (primary) hypertension: Secondary | ICD-10-CM | POA: Diagnosis not present

## 2016-05-03 DIAGNOSIS — Z79899 Other long term (current) drug therapy: Secondary | ICD-10-CM | POA: Insufficient documentation

## 2016-05-03 DIAGNOSIS — E039 Hypothyroidism, unspecified: Secondary | ICD-10-CM | POA: Insufficient documentation

## 2016-05-03 DIAGNOSIS — M62838 Other muscle spasm: Secondary | ICD-10-CM | POA: Diagnosis not present

## 2016-05-03 DIAGNOSIS — R079 Chest pain, unspecified: Secondary | ICD-10-CM | POA: Diagnosis not present

## 2016-05-03 DIAGNOSIS — Z7982 Long term (current) use of aspirin: Secondary | ICD-10-CM | POA: Diagnosis not present

## 2016-05-03 LAB — I-STAT TROPONIN, ED: Troponin i, poc: 0 ng/mL (ref 0.00–0.08)

## 2016-05-03 LAB — CBC
HEMATOCRIT: 38.4 % (ref 36.0–46.0)
HEMOGLOBIN: 12.6 g/dL (ref 12.0–15.0)
MCH: 27.9 pg (ref 26.0–34.0)
MCHC: 32.8 g/dL (ref 30.0–36.0)
MCV: 85.1 fL (ref 78.0–100.0)
Platelets: 209 10*3/uL (ref 150–400)
RBC: 4.51 MIL/uL (ref 3.87–5.11)
RDW: 13.9 % (ref 11.5–15.5)
WBC: 4.9 10*3/uL (ref 4.0–10.5)

## 2016-05-03 LAB — BASIC METABOLIC PANEL
ANION GAP: 7 (ref 5–15)
BUN: 19 mg/dL (ref 6–20)
CHLORIDE: 110 mmol/L (ref 101–111)
CO2: 24 mmol/L (ref 22–32)
Calcium: 10 mg/dL (ref 8.9–10.3)
Creatinine, Ser: 1 mg/dL (ref 0.44–1.00)
GFR calc Af Amer: >60 mL/min (ref >60)
GFR calc non Af Amer: 52 mL/min — ABNORMAL LOW (ref >60)
GLUCOSE: 140 mg/dL — AB (ref 65–99)
POTASSIUM: 4.9 mmol/L (ref 3.5–5.1)
Sodium: 141 mmol/L (ref 135–145)

## 2016-05-03 NOTE — ED Triage Notes (Signed)
Pt complaining of dull right sided chest pain that radiates through to her back and R arm x this morning. Pt states some SOB, denies any N/V/D.

## 2016-05-03 NOTE — ED Provider Notes (Signed)
TIME SEEN:  By signing my name below, I, Arianna Nassar, attest that this documentation has been prepared under the direction and in the presence of [employer], DO.  Electronically Signed: Julien Nordmann, ED Scribe. 05/03/16. 11:29 PM.   CHIEF COMPLAINT:  Chief Complaint  Patient presents with  . Chest Pain     HPI:  HPI Comments: Claudia [name] is a 82 y.o. female history of hypertension, hyperlipidemia who presents to the Emergency Department complaining of gradual onset, gradual worsening right sided neck pain that radiates into her right shoulder blade and right shoulder that started ~ 2 days ago. She describes her pain as a "crick in my neck". Pt says her pain is intermittent and became gradually worse yesterday. Pt reports that she was in the gym 11/01 lifting weights and she believes that may be the cause of her symptoms. She denies getting chest pain or shortness of breath when working out. She denies to me that she has had any chest pain, shortness of breath, diaphoresis, nausea, dizziness. Pt has been taking hydrocodone prescribed by her PCP to alleviate her pain with relief. Denies numbness, tingling, weakness, bladder/bowel incontinence, urinary retention. No midline neck pain. Pain worse with movement of the arm.   ROS: See HPI Constitutional: no fever  Eyes: no drainage  ENT: no runny nose   Cardiovascular:  no chest pain  Resp: no SOB  GI: no vomiting GU: no dysuria Integumentary: no rash  Allergy: no hives  Musculoskeletal: no leg swelling  Neurological: no slurred speech ROS otherwise negative  PAST MEDICAL HISTORY/PAST SURGICAL HISTORY:  Past Medical History:  Diagnosis Date  . Abnormality of gait   . Brachial neuritis or radiculitis NOS   . Degeneration of lumbar or lumbosacral intervertebral disc   . Essential hypertension, benign   . Gouty arthropathy   . Lumbago   . Obesity   . Osteoarthritis   . Pure hypercholesterolemia   . Unspecified  hypothyroidism     MEDICATIONS:  Prior to Admission medications   Medication Sig Start Date End Date Taking? Authorizing Provider  aspirin 81 MG tablet Take 81 mg by mouth daily.   Yes Historical Provider, MD  cetirizine (ZYRTEC) 10 MG tablet Take 10 mg by mouth daily.   Yes Historical Provider, MD  HYDROcodone-acetaminophen (NORCO/VICODIN) 5-325 MG tablet Take 1 tablet by mouth every 6 (six) hours as needed for moderate pain. 04/15/16  Yes Janith Lima, MD  ipratropium (ATROVENT) 0.03 % nasal spray Place 2 sprays into both nostrils every 12 (twelve) hours. Patient taking differently: Place 2 sprays into both nostrils every 12 (twelve) hours as needed for rhinitis.  03/18/16  Yes Charlene Brooke Nche, NP  irbesartan-hydrochlorothiazide (AVALIDE) 300-12.5 MG tablet Take 1 tablet by mouth daily. 09/02/15  Yes Janith Lima, MD  lubiprostone (AMITIZA) 24 MCG capsule Take 1 capsule (24 mcg total) by mouth 2 (two) times daily with a meal. 03/04/16  Yes Janith Lima, MD  MEGARED OMEGA-3 KRILL OIL 500 MG CAPS Take 500 mg by mouth daily.   Yes Historical Provider, MD  naproxen sodium (ALEVE) 220 MG tablet Take 220 mg by mouth 2 (two) times daily as needed (for shoulder pain).   Yes Historical Provider, MD  Pitavastatin Calcium (LIVALO) 2 MG TABS Take 1 tablet (2 mg total) by mouth daily. 08/25/15  Yes Janith Lima, MD  vitamin C (ASCORBIC ACID) 500 MG tablet Take 500 mg by mouth daily.   Yes Historical Provider, MD  vitamin E 400 UNIT capsule Take 400 Units by mouth daily.   Yes Historical Provider, MD    ALLERGIES:  Allergies  Allergen Reactions  . Lipitor [Atorvastatin]     Muscle aches  . Trileptal [Oxcarbazepine]     confusion    SOCIAL HISTORY:  Social History  Substance Use Topics  . Smoking status: Never Smoker  . Smokeless tobacco: Never Used  . Alcohol use No     Comment: social    FAMILY HISTORY: Family History  Problem Relation Age of Onset  . Heart disease Father   .  Hypertension Mother   . Alzheimer's disease Mother   . Diabetes Sister   . Cirrhosis Brother   . Cancer Neg Hx   . Kidney disease Neg Hx     EXAM: BP 154/70 (BP Location: Right Arm)   Pulse 63   Temp 98 F (36.7 C) (Oral)   Resp 17   Ht 5\' 10"  (1.778 m)   Wt 238 lb (108 kg)   SpO2 98%   BMI 34.15 kg/m  CONSTITUTIONAL: Alert and oriented and responds appropriately to questions. Well-appearing; well-nourished; appears younger than stated age HEAD: Normocephalic EYES: Conjunctivae clear, PERRL ENT: normal nose; no rhinorrhea; moist mucous membranes NECK: Supple, no meningismus, no LAD  CARD: RRR; S1 and S2 appreciated; no murmurs, no clicks, no rubs, no gallops RESP: Normal chest excursion without splinting or tachypnea; breath sounds clear and equal bilaterally; no wheezes, no rhonchi, no rales, no hypoxia or respiratory distress, speaking full sentences ABD/GI: Normal bowel sounds; non-distended; soft, non-tender, no rebound, no guarding, no peritoneal signs BACK:  The back appears normal and is no spinal tenderness, step off or deformity to palpation, there is no CVA tenderness; TTP over her right trapezius muscle with associated muscle spasm which reproduces her pain; no warmth, redness or other lesions EXT: Normal ROM in all joints; non-tender to palpation; no edema; normal capillary refill; no cyanosis, no calf tenderness or swelling    SKIN: Normal color for age and race; warm; no rash NEURO: Moves all extremities equally, sensation to light touch intact diffusely, cranial nerves II through XII intact, normal gait PSYCH: The patient's mood and manner are appropriate. Grooming and personal hygiene are appropriate.  MEDICAL DECISION MAKING: Patient here with right trapezius muscle strain, spasm. She denies to me any chest pain, shortness of breath, diaphoresis, dizziness or nausea. Workup ordered in triage is been unremarkable including negative troponin, unchanged EKG, clear chest  x-ray. I do not think that this is cardiac in nature. Pain is reproducible with movement of her right arm and palpation of her trapezius muscle. She has Vicodin at home to take for pain. She is requesting discharge home. She has a PCP for follow-up. Doubt dissection, PE. Chest x-ray does show before meals joint osteoarthritis with spurring which could cause impingement with shoulder abduction. She does have some pain with shoulder abduction but it seems to be over the trapezius muscle rather than the before meals joint. She does have full range of motion in this joint. No injury to suggest fracture. Nothing to suggest septic arthritis, gout. I feel she is safe for discharge home. Discussed at length return precautions. Recommended massage, heat. She verbalized understanding and is comfortable with this plan.   At this time, I do not feel there is any life-threatening condition present. I have reviewed and discussed all results (EKG, imaging, lab, urine as appropriate), exam findings with patient/family. I have reviewed nursing notes and  appropriate previous records.  I feel the patient is safe to be discharged home without further emergent workup and can continue workup as an outpatient as needed. Discussed usual and customary return precautions. Patient/family verbalize understanding and are comfortable with this plan.  Outpatient follow-up has been provided. All questions have been answered.    EKG Interpretation  Date/Time:  Monday May 03 2016 18:20:35 EST Ventricular Rate:  66 PR Interval:  178 QRS Duration: 74 QT Interval:  444 QTC Calculation: 465 R Axis:   13 Text Interpretation:  Normal sinus rhythm Cannot rule out Anterior infarct , age undetermined Abnormal ECG No significant change since last tracing Reconfirmed by WARD,  DO, KRISTEN [id number]) on 05/03/2016 11:14:57 PM        I personally performed the services described in this documentation, which was scribed in my presence. The  recorded information has been reviewed and is accurate.    June Park, DO 05/03/16 2347

## 2016-05-03 NOTE — Discharge Instructions (Signed)
Continue your hydrocodone as needed as prescribed for your pain. Please stop your primary care physician. If you began having chest pain, shortness of breath, dizziness, began sweating, please return to the hospital immediately.

## 2016-05-06 ENCOUNTER — Emergency Department (HOSPITAL_COMMUNITY): Payer: PPO

## 2016-05-06 ENCOUNTER — Emergency Department (HOSPITAL_COMMUNITY)
Admission: EM | Admit: 2016-05-06 | Discharge: 2016-05-06 | Disposition: A | Discharge status: Home / Self Care | Payer: PPO | Attending: Emergency Medicine | Admitting: Emergency Medicine

## 2016-05-06 ENCOUNTER — Encounter (HOSPITAL_COMMUNITY): Payer: Self-pay | Admitting: Emergency Medicine

## 2016-05-06 ENCOUNTER — Ambulatory Visit: Payer: PPO | Admitting: Internal Medicine

## 2016-05-06 DIAGNOSIS — I10 Essential (primary) hypertension: Secondary | ICD-10-CM | POA: Insufficient documentation

## 2016-05-06 DIAGNOSIS — Z79899 Other long term (current) drug therapy: Secondary | ICD-10-CM | POA: Insufficient documentation

## 2016-05-06 DIAGNOSIS — Z7982 Long term (current) use of aspirin: Secondary | ICD-10-CM | POA: Diagnosis not present

## 2016-05-06 DIAGNOSIS — R0602 Shortness of breath: Secondary | ICD-10-CM | POA: Insufficient documentation

## 2016-05-06 DIAGNOSIS — M542 Cervicalgia: Secondary | ICD-10-CM | POA: Diagnosis not present

## 2016-05-06 DIAGNOSIS — M5412 Radiculopathy, cervical region: Secondary | ICD-10-CM | POA: Diagnosis not present

## 2016-05-06 DIAGNOSIS — R29898 Other symptoms and signs involving the musculoskeletal system: Secondary | ICD-10-CM

## 2016-05-06 DIAGNOSIS — R079 Chest pain, unspecified: Secondary | ICD-10-CM | POA: Diagnosis not present

## 2016-05-06 LAB — BRAIN NATRIURETIC PEPTIDE: B NATRIURETIC PEPTIDE 5: 111.1 pg/mL — AB (ref 0.0–100.0)

## 2016-05-06 LAB — BASIC METABOLIC PANEL
Anion gap: 6 (ref 5–15)
BUN: 21 mg/dL — ABNORMAL HIGH (ref 6–20)
CHLORIDE: 109 mmol/L (ref 101–111)
CO2: 25 mmol/L (ref 22–32)
Calcium: 10 mg/dL (ref 8.9–10.3)
Creatinine, Ser: 1.07 mg/dL — ABNORMAL HIGH (ref 0.44–1.00)
GFR calc non Af Amer: 48 mL/min — ABNORMAL LOW (ref >60)
GFR, EST AFRICAN AMERICAN: 55 mL/min — AB (ref >60)
Glucose, Bld: 96 mg/dL (ref 65–99)
POTASSIUM: 4.1 mmol/L (ref 3.5–5.1)
SODIUM: 140 mmol/L (ref 135–145)

## 2016-05-06 LAB — CBC
HEMATOCRIT: 40.3 % (ref 36.0–46.0)
Hemoglobin: 13.2 g/dL (ref 12.0–15.0)
MCH: 27.8 pg (ref 26.0–34.0)
MCHC: 32.8 g/dL (ref 30.0–36.0)
MCV: 85 fL (ref 78.0–100.0)
Platelets: 201 10*3/uL (ref 150–400)
RBC: 4.74 MIL/uL (ref 3.87–5.11)
RDW: 14.1 % (ref 11.5–15.5)
WBC: 5.4 10*3/uL (ref 4.0–10.5)

## 2016-05-06 LAB — I-STAT TROPONIN, ED: Troponin i, poc: 0 ng/mL (ref 0.00–0.08)

## 2016-05-06 MED ORDER — HYDROCODONE-ACETAMINOPHEN 5-325 MG PO TABS
2.0000 | ORAL_TABLET | Freq: Once | ORAL | Status: AC
  Administered 2016-05-06: 2 via ORAL
  Filled 2016-05-06: qty 2

## 2016-05-06 MED ORDER — NAPROXEN 250 MG PO TABS
375.0000 mg | ORAL_TABLET | Freq: Once | ORAL | Status: AC
  Administered 2016-05-06: 375 mg via ORAL
  Filled 2016-05-06: qty 2

## 2016-05-06 MED ORDER — HYDROCODONE-ACETAMINOPHEN 5-325 MG PO TABS: 1.0000 | ORAL_TABLET | ORAL | 0 refills | Status: DC | PRN

## 2016-05-06 MED ORDER — CYCLOBENZAPRINE HCL 10 MG PO TABS
5.0000 mg | ORAL_TABLET | Freq: Once | ORAL | Status: AC
  Administered 2016-05-06: 5 mg via ORAL
  Filled 2016-05-06: qty 1

## 2016-05-06 MED ORDER — NAPROXEN 375 MG PO TABS: 375.0000 mg | ORAL_TABLET | Freq: Two times a day (BID) | ORAL | 0 refills | Status: DC

## 2016-05-06 MED ORDER — ORPHENADRINE CITRATE ER 100 MG PO TB12: 100.0000 mg | ORAL_TABLET | Freq: Two times a day (BID) | ORAL | 0 refills | Status: DC

## 2016-05-06 NOTE — ED Provider Notes (Signed)
[facility] DEPT Provider Note   CSN: [id number] Arrival date & time: 05/06/16  0908     History   Chief Complaint Chief Complaint  Patient presents with  . Arm Pain    HPI Claudia [name] is a 82 y.o. female.  HPI Patient was seen 3 days ago in the emergency department with a 2 day history of right cervical pain described as a "crick in my neck". At that time, the patient thought it was due to working out in the gym. Diagnostic workup was negative for acute cardiopulmonary etiology of right neck and arm pain. Patient returns today reporting that this morning when she got up she couldn't raise her arm up off of the bed. She has not had loss of sensation or function of the hand. 4 she has a lot of pain on the side of her neck and the top of her shoulder and trapezius. No associated chest pain, shortness of breath. No headache. No other neurologic dysfunction. No visual changes no speech dysfunction no confusion no gait incoordination. Past Medical History:  Diagnosis Date  . Abnormality of gait   . Brachial neuritis or radiculitis NOS   . Degeneration of lumbar or lumbosacral intervertebral disc   . Essential hypertension, benign   . Gouty arthropathy   . Lumbago   . Obesity   . Osteoarthritis   . Pure hypercholesterolemia   . Unspecified hypothyroidism     Patient Active Problem List   Diagnosis Date Noted  . Bradycardia 01/01/2016  . Therapeutic opioid-induced constipation (OIC) 01/01/2016  . Lumbosacral spondylosis without myelopathy 04/09/2013  . Postlaminectomy syndrome, lumbar region 04/09/2013  . Routine general medical examination at a health care facility 02/01/2012  . Neuropathy, peripheral ([place]) 08/04/2011  . Pure hypercholesterolemia 08/04/2011  . Essential hypertension, benign 08/04/2011  . Hypothyroidism 08/04/2011  . DJD (degenerative joint disease) of knee 08/04/2011  . Gout 08/04/2011  . Visit for screening mammogram 08/04/2011    Past Surgical  History:  Procedure Laterality Date  . ABDOMINAL HYSTERECTOMY  03/13/2010  . BACK SURGERY    . LUMBAR LAMINECTOMY  03/13/2010  . right knee replacement  03/13/2010    OB History    No data available       Home Medications    Prior to Admission medications   Medication Sig Start Date End Date Taking? Authorizing Provider  aspirin 81 MG tablet Take 81 mg by mouth daily.   Yes Historical Provider, MD  cetirizine (ZYRTEC) 10 MG tablet Take 10 mg by mouth daily.   Yes Historical Provider, MD  HYDROcodone-acetaminophen (NORCO/VICODIN) 5-325 MG tablet Take 1 tablet by mouth every 6 (six) hours as needed for moderate pain. 04/15/16  Yes Janith Lima, MD  ipratropium (ATROVENT) 0.03 % nasal spray Place 2 sprays into both nostrils every 12 (twelve) hours. Patient taking differently: Place 2 sprays into both nostrils every 12 (twelve) hours as needed for rhinitis.  03/18/16  Yes Charlene Brooke Nche, NP  irbesartan-hydrochlorothiazide (AVALIDE) 300-12.5 MG tablet Take 1 tablet by mouth daily. 09/02/15  Yes Janith Lima, MD  lubiprostone (AMITIZA) 24 MCG capsule Take 1 capsule (24 mcg total) by mouth 2 (two) times daily with a meal. 03/04/16  Yes Janith Lima, MD  naproxen sodium (ALEVE) 220 MG tablet Take 220 mg by mouth 2 (two) times daily as needed (for shoulder pain).   Yes Historical Provider, MD  Pitavastatin Calcium (LIVALO) 2 MG TABS Take 1 tablet (2 mg total) by mouth  daily. 08/25/15  Yes Janith Lima, MD  vitamin C (ASCORBIC ACID) 500 MG tablet Take 500 mg by mouth daily.   Yes Historical Provider, MD  vitamin E 400 UNIT capsule Take 400 Units by mouth daily.   Yes Historical Provider, MD  naproxen (NAPROSYN) 375 MG tablet Take 1 tablet (375 mg total) by mouth 2 (two) times daily. 05/06/16   Charlesetta Shanks, MD  orphenadrine (NORFLEX) 100 MG tablet Take 1 tablet (100 mg total) by mouth 2 (two) times daily. 05/06/16   Charlesetta Shanks, MD    Family History Family History  Problem Relation Age  of Onset  . Heart disease Father   . Hypertension Mother   . Alzheimer's disease Mother   . Diabetes Sister   . Cirrhosis Brother   . Cancer Neg Hx   . Kidney disease Neg Hx     Social History Social History  Substance Use Topics  . Smoking status: Never Smoker  . Smokeless tobacco: Never Used  . Alcohol use No     Comment: social     Allergies   Lipitor [atorvastatin] and Trileptal [oxcarbazepine]   Review of Systems Review of Systems 10 Systems reviewed and are negative for acute change except as noted in the HPI.   Physical Exam Updated Vital Signs BP 167/77   Pulse (!) 50   Temp 97.5 F (36.4 C) (Oral)   Resp 21   Ht 5\' 10"  (1.778 m)   Wt 238 lb (108 kg)   SpO2 100%   BMI 34.15 kg/m   Physical Exam  Constitutional: She is oriented to person, place, and time. She appears well-developed and well-nourished. No distress.  HENT:  Head: Normocephalic and atraumatic.  Right Ear: External ear normal.  Left Ear: External ear normal.  Nose: Nose normal.  Mouth/Throat: Oropharynx is clear and moist.  Eyes: EOM are normal. Pupils are equal, round, and reactive to light.  Neck:  Patient has significant reproducible spasm and tenderness on the paracervical muscle bodies of the right side of the neck. This includes the trapezius.  Cardiovascular: Normal rate, regular rhythm, normal heart sounds and intact distal pulses.   Pulmonary/Chest: Effort normal and breath sounds normal.  Abdominal: Soft. She exhibits no distension. There is no tenderness. There is no guarding.  Musculoskeletal: She exhibits no edema or deformity.  Tenderness of the right trapezius with spasm as outlined. Radial pulse 2+ and strong. No soft tissue swelling or tenderness of the upper or lower extremities.  Neurological: She is alert and oriented to person, place, and time. No cranial nerve deficit.  Right upper extremity is weak for abduction at the shoulder. Also biceps flexion against  resistance approximately 3\5. Shoulder shrug is strong 5\5. Wrist flexion and extension strong 5\5. Grip strong 5\5. Interosseous resistance 5\5.  Skin: Skin is warm and dry.  Psychiatric: She has a normal mood and affect.     ED Treatments / Results  Labs (all labs ordered are listed, but only abnormal results are displayed) Labs Reviewed  BASIC METABOLIC PANEL - Abnormal; Notable for the following:       Result Value   BUN 21 (*)    Creatinine, Ser 1.07 (*)    GFR calc non Af Amer 48 (*)    GFR calc Af Amer 55 (*)    All other components within normal limits  BRAIN NATRIURETIC PEPTIDE - Abnormal; Notable for the following:    B Natriuretic Peptide 111.1 (*)    All other  components within normal limits  CBC  I-STAT TROPOININ, ED    EKG  EKG Interpretation None       Radiology Dg Chest 2 View  Result Date: 05/06/2016 CLINICAL DATA:  Chest and right-sided shoulder pain for the past 3 days. EXAM: CHEST  2 VIEW COMPARISON:  PA and lateral chest x-ray of May 03, 2016 FINDINGS: The lungs are borderline hypoinflated. There is no focal infiltrate. The infant interstitial markings are chronically increased in the left infrahilar region. The heart is top-normal in size. The pulmonary vascularity is normal. There is calcification in the wall of the aortic arch. There is tortuosity of the descending thoracic aorta. There is multilevel degenerative disc disease of the thoracic spine. IMPRESSION: Mild chronic bronchitic changes.  No alveolar pneumonia nor CHF. Aortic atherosclerosis. Electronically Signed   By: David  Martinique M.D.   On: 05/06/2016 10:10   Mr Cervical Spine Wo Contrast  Result Date: 05/06/2016 CLINICAL DATA:  Acute presentation with worsening right-sided neck pain radiating to the right shoulder over the last 2 days. EXAM: MRI CERVICAL SPINE WITHOUT CONTRAST TECHNIQUE: Multiplanar, multisequence MR imaging of the cervical spine was performed. No intravenous contrast was  administered. COMPARISON:  CT myelogram 11/27/2010 FINDINGS: Alignment: Straightening of the normal cervical lordosis. 2 mm anterolisthesis C7-T1. Vertebrae: No fracture or primary bone lesion. Diffusely heterogeneous marrow pattern, quite likely benign. Cord: No primary cord lesion. Cord deformity due to stenosis as outlined at levels below. Some abnormal cord signal at C4-5 and C5-6 secondary to this. Posterior Fossa, vertebral arteries, paraspinal tissues: Negative Disc levels: Foramen magnum and C1-2: Normal C2-3: No disc pathology. Facet arthropathy on the right. Mild foraminal narrowing on the right. C3-4: Spondylosis with endplate osteophytes and bulging of the disc. Facet arthropathy left worse than right. Effacement of the subarachnoid space with slight deformity of the left side of the cord. No abnormal cord signal. Foraminal narrowing that could affect the C4 nerve roots. C4-5: Spondylosis with endplate osteophytes and bulging of the disc. Effacement of the subarachnoid space with slight deformity the cord more on the left. Some abnormal T2 signal within the cord more on the left. Facet arthropathy on the left. Foraminal stenosis bilaterally that could affect the C5 nerve roots. C5-6: Spondylosis with endplate osteophytes and bulging disc material more prominent towards the left. Facet arthropathy bilaterally. Spinal stenosis with effacement of the subarachnoid space and flattening of the left side of the cord. Some abnormal T2 signal in the cord on the left. Foraminal stenosis could compress either C6 nerve root. C6-7: Endplate osteophytes and mild bulging of the disc. No central canal stenosis. Foramina sufficiently patent. C7-T1: Facet arthropathy with 2 mm of anterolisthesis. Mild bulging of the disc. No compressive stenosis. IMPRESSION: Degenerative spondylosis with multifactorial stenosis at C3-4, C4-5 and C5-6. There is effacement of the subarachnoid space and deformity of the cord left more than  right in this region. There is some abnormal T2 signal in the cord more on the left. There is foraminal stenosis bilaterally at these 3 levels. Electronically Signed   By: Nelson Chimes M.D.   On: 05/06/2016 11:26    Procedures Procedures (including critical care time)  Medications Ordered in ED Medications  cyclobenzaprine (FLEXERIL) tablet 5 mg (5 mg Oral Given 05/06/16 1010)  naproxen (NAPROSYN) tablet 375 mg (375 mg Oral Given 05/06/16 1010)     Initial Impression / Assessment and Plan / ED Course  I have reviewed the triage vital signs and the nursing notes.  Pertinent labs & imaging results that were available during my care of the patient were reviewed by me and considered in my medical decision making (see chart for details).  Clinical Course    Consult: (12:50) reviewed with Dr. Christella Noa. Will see patient in the ED.  Final Clinical Impressions(s) / ED Diagnoses   Final diagnoses:  Cervical radiculopathy  Patient awakened with arm weakness suggestive of C5 nerve root distribution. MRI indicates cord compression. Patient's case reviewed with Dr. Cyndy Freeze who will evaluate the patient in the emergency department. She is otherwise alert and nontoxic. She is well in appearance. All other neurologic exam is normal. New Prescriptions New Prescriptions   NAPROXEN (NAPROSYN) 375 MG TABLET    Take 1 tablet (375 mg total) by mouth 2 (two) times daily.   ORPHENADRINE (NORFLEX) 100 MG TABLET    Take 1 tablet (100 mg total) by mouth 2 (two) times daily.     Charlesetta Shanks, MD 05/06/16 1255

## 2016-05-06 NOTE — ED Notes (Signed)
Food and drink given to patient and family again  They are very hungry

## 2016-05-06 NOTE — ED Triage Notes (Signed)
Pt from home with c/o right arm/shoulder pain since this past Monday and was seen here for the same.  Pt reports the pain is increasing today.  Pt additionally reports she woke with with SOB.  NAD, A&O.

## 2016-05-06 NOTE — Consult Note (Signed)
Reason for Consult:weakness right shoulder, pain Referring Physician: ed  Claudia [name] is an 82 y.o. female.  HPI: whom reports having pain in her neck starting on Sunday of this week. She was doing well prior to that, and she cannot remember anything else happening that would have caused the pain. She went to the [facility] ED on 11/6 and was evaluated for neck pain. Today she came in to the ED because she thought she was having a heart attack due to the pain in her right arm, and the weakness in the right shoulder. Cervical MRI was performed and I was consulted. This had never occurred before.  Past Medical History:  Diagnosis Date  . Abnormality of gait   . Brachial neuritis or radiculitis NOS   . Degeneration of lumbar or lumbosacral intervertebral disc   . Essential hypertension, benign   . Gouty arthropathy   . Lumbago   . Obesity   . Osteoarthritis   . Pure hypercholesterolemia   . Unspecified hypothyroidism     Past Surgical History:  Procedure Laterality Date  . ABDOMINAL HYSTERECTOMY  03/13/2010  . BACK SURGERY    . LUMBAR LAMINECTOMY  03/13/2010  . right knee replacement  03/13/2010    Family History  Problem Relation Age of Onset  . Heart disease Father   . Hypertension Mother   . Alzheimer's disease Mother   . Diabetes Sister   . Cirrhosis Brother   . Cancer Neg Hx   . Kidney disease Neg Hx     Social History:  reports that she has never smoked. She has never used smokeless tobacco. She reports that she does not drink alcohol or use drugs.  Allergies:  Allergies  Allergen Reactions  . Lipitor [Atorvastatin] Other (See Comments)    Muscle aches  . Trileptal [Oxcarbazepine] Other (See Comments)    confusion    Medications: I have reviewed the patient's current medications.  Results for orders placed or performed during the hospital encounter of 05/06/16 (from the past 48 hour(s))  Basic metabolic panel     Status: Abnormal   Collection Time: 05/06/16   9:22 AM  Result Value Ref Range   Sodium 140 135 - 145 mmol/L   Potassium 4.1 3.5 - 5.1 mmol/L   Chloride 109 101 - 111 mmol/L   CO2 25 22 - 32 mmol/L   Glucose, Bld 96 65 - 99 mg/dL   BUN 21 (H) 6 - 20 mg/dL   Creatinine, Ser 1.07 (H) 0.44 - 1.00 mg/dL   Calcium 10.0 8.9 - 10.3 mg/dL   GFR calc non Af Amer 48 (L) >60 mL/min   GFR calc Af Amer 55 (L) >60 mL/min    Comment: (NOTE) The eGFR has been calculated using the CKD EPI equation. This calculation has not been validated in all clinical situations. eGFR's persistently <60 mL/min signify possible Chronic Kidney Disease.    Anion gap 6 5 - 15  CBC     Status: None   Collection Time: 05/06/16  9:22 AM  Result Value Ref Range   WBC 5.4 4.0 - 10.5 K/uL   RBC 4.74 3.87 - 5.11 MIL/uL   Hemoglobin 13.2 12.0 - 15.0 g/dL   HCT 40.3 36.0 - 46.0 %   MCV 85.0 78.0 - 100.0 fL   MCH 27.8 26.0 - 34.0 pg   MCHC 32.8 30.0 - 36.0 g/dL   RDW 14.1 11.5 - 15.5 %   Platelets 201 150 - 400 K/uL  Brain  natriuretic peptide     Status: Abnormal   Collection Time: 05/06/16  9:22 AM  Result Value Ref Range   B Natriuretic Peptide 111.1 (H) 0.0 - 100.0 pg/mL  I-stat troponin, ED     Status: None   Collection Time: 05/06/16  9:27 AM  Result Value Ref Range   Troponin i, poc 0.00 0.00 - 0.08 ng/mL   Comment 3            Comment: Due to the release kinetics of cTnI, a negative result within the first hours of the onset of symptoms does not rule out myocardial infarction with certainty. If myocardial infarction is still suspected, repeat the test at appropriate intervals.     Dg Chest 2 View  Result Date: 05/06/2016 CLINICAL DATA:  Chest and right-sided shoulder pain for the past 3 days. EXAM: CHEST  2 VIEW COMPARISON:  PA and lateral chest x-ray of May 03, 2016 FINDINGS: The lungs are borderline hypoinflated. There is no focal infiltrate. The infant interstitial markings are chronically increased in the left infrahilar region. The heart is  top-normal in size. The pulmonary vascularity is normal. There is calcification in the wall of the aortic arch. There is tortuosity of the descending thoracic aorta. There is multilevel degenerative disc disease of the thoracic spine. IMPRESSION: Mild chronic bronchitic changes.  No alveolar pneumonia nor CHF. Aortic atherosclerosis. Electronically Signed   By: David  [place] M.D.   On: 05/06/2016 10:10   Ct Cervical Spine Wo Contrast  Result Date: 05/06/2016 CLINICAL DATA:  Initial evaluation for right-sided neck pain and right arm pain with weakness for 1 day. No trauma. EXAM: CT CERVICAL SPINE WITHOUT CONTRAST TECHNIQUE: Multidetector CT imaging of the cervical spine was performed without intravenous contrast. Multiplanar CT image reconstructions were also generated. COMPARISON:  None available. FINDINGS: Alignment: Straightening with slight reversal of the normal cervical lordosis, apex at C5-6. Skull base and vertebrae: Skullbase intact. There is asymmetry with shouldering along the right aspect of the base of the dens, which articulates with the right lateral mass of C1 (series 201, image 31). Subsequent decreased height of the right lateral mass of C2. These changes are likely congenital in nature. The left lateral mass of C1 has a normal configuration. Anterior and posterior rings of C1 are normal and intact. Remainder of the C2 vertebral body within normal limits. Vertebral body heights are maintained. No evidence for acute fracture. Visualized soft tissues of the neck Soft tissues and spinal canal: Demonstrate no acute abnormality. No prevertebral edema or swelling. Thyroid within normal limits. Vascular calcifications noted about the carotid bifurcations. Disc levels: C2-3: Mild disc bulge with right greater than left uncovertebral spurring. Advanced bilateral facet arthropathy. There is moderate bony foraminal stenosis bilaterally, right worse than left. No significant canal stenosis. C3-4:  Degenerative intervertebral disc space narrowing with bilateral uncovertebral spurring, left greater than right. Advanced left-sided facet arthrosis, with more mild right-sided facet arthropathy. Fairly severe bilateral foraminal stenosis, left greater than right. No significant canal narrowing. C4-5: Diffuse degenerative intervertebral disc space narrowing with endplate osteophytosis and uncovertebral spurring. Bilateral facet arthrosis. No significant canal narrowing. Fairly severe bilateral bony foraminal narrowing. C5-6: Diffuse degenerative intervertebral disc space narrowing with endplate sclerosis and osteophytosis. Bilateral uncovertebral spurring. Discal and/or ligamentous calcification noted posterior to the C5-6 interspace. Moderate canal stenosis. Moderate bilateral foraminal narrowing, left worse than right. C6-7: Diffuse degenerative intervertebral disc space narrowing with endplate sclerosis. Bilateral uncovertebral spurring. Mild to moderate bilateral foraminal narrowing. No significant  canal stenosis. C7-T1: Mild bilateral uncovertebral hypertrophy. No significant stenosis. Visualized portions of the upper thoracic spine within normal limits. Upper chest: Visualized upper chest is clear without acute abnormality. IMPRESSION: 1. No acute abnormality within the cervical spine. 2. Advanced multilevel degenerative spondylolysis and facet arthrosis as detailed above. Resultant moderate canal stenosis at the C5-6 level. Fairly severe bilateral foraminal narrowing, primarily within the upper cervical spine related to uncovertebral and facet disease. Please see above report for a full description of these findings. 3. 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 Electronically Signed   By: Jeannine Boga M.D.   On: 05/06/2016 14:45   Mr Cervical Spine Wo Contrast  Result Date: 05/06/2016 CLINICAL DATA:  Acute presentation with worsening right-sided  neck pain radiating to the right shoulder over the last 2 days. EXAM: MRI CERVICAL SPINE WITHOUT CONTRAST TECHNIQUE: Multiplanar, multisequence MR imaging of the cervical spine was performed. No intravenous contrast was administered. COMPARISON:  CT myelogram 11/27/2010 FINDINGS: Alignment: Straightening of the normal cervical lordosis. 2 mm anterolisthesis C7-T1. Vertebrae: No fracture or primary bone lesion. Diffusely heterogeneous marrow pattern, quite likely benign. Cord: No primary cord lesion. Cord deformity due to stenosis as outlined at levels below. Some abnormal cord signal at C4-5 and C5-6 secondary to this. Posterior Fossa, vertebral arteries, paraspinal tissues: Negative Disc levels: Foramen magnum and C1-2: Normal C2-3: No disc pathology. Facet arthropathy on the right. Mild foraminal narrowing on the right. C3-4: Spondylosis with endplate osteophytes and bulging of the disc. Facet arthropathy left worse than right. Effacement of the subarachnoid space with slight deformity of the left side of the cord. No abnormal cord signal. Foraminal narrowing that could affect the C4 nerve roots. C4-5: Spondylosis with endplate osteophytes and bulging of the disc. Effacement of the subarachnoid space with slight deformity the cord more on the left. Some abnormal T2 signal within the cord more on the left. Facet arthropathy on the left. Foraminal stenosis bilaterally that could affect the C5 nerve roots. C5-6: Spondylosis with endplate osteophytes and bulging disc material more prominent towards the left. Facet arthropathy bilaterally. Spinal stenosis with effacement of the subarachnoid space and flattening of the left side of the cord. Some abnormal T2 signal in the cord on the left. Foraminal stenosis could compress either C6 nerve root. C6-7: Endplate osteophytes and mild bulging of the disc. No central canal stenosis. Foramina sufficiently patent. C7-T1: Facet arthropathy with 2 mm of anterolisthesis. Mild  bulging of the disc. No compressive stenosis. IMPRESSION: Degenerative spondylosis with multifactorial stenosis at C3-4, C4-5 and C5-6. There is effacement of the subarachnoid space and deformity of the cord left more than right in this region. There is some abnormal T2 signal in the cord more on the left. There is foraminal stenosis bilaterally at these 3 levels. Electronically Signed   By: Nelson Chimes M.D.   On: 05/06/2016 11:26    Review of Systems  HENT: Negative.   Eyes: Negative.   Respiratory: Negative.   Cardiovascular: Positive for chest pain.       Right sided chest pain, associated with right shoulder pain  Gastrointestinal: Negative.   Genitourinary: Negative.   Musculoskeletal: Positive for neck pain.  Skin: Negative.   Neurological: Positive for focal weakness and weakness.  Endo/Heme/Allergies: Negative.   Psychiatric/Behavioral: Negative.    Blood pressure 137/62, pulse (!) 53, temperature 97.5 F (36.4 C), temperature source Oral, resp. rate 20, height '5\' 10"'$  (1.778 m), weight 108 kg (238 lb),  SpO2 100 %. Physical Exam  Constitutional: She is oriented to person, place, and time. She appears well-developed and well-nourished. She appears distressed.  HENT:  Head: Normocephalic and atraumatic.  Right Ear: External ear normal.  Left Ear: External ear normal.  Nose: Nose normal.  Mouth/Throat: Oropharynx is clear and moist.  Eyes: Conjunctivae and EOM are normal. Pupils are equal, round, and reactive to light.  Neck: Normal range of motion. Neck supple.  Cardiovascular: Normal rate, regular rhythm and normal heart sounds.   Respiratory: Effort normal and breath sounds normal.  GI: Soft.  Musculoskeletal: She exhibits tenderness.  Tender on right shoulder.  Neurological: She is alert and oriented to person, place, and time. A cranial nerve deficit is present. No sensory deficit. She exhibits normal muscle tone. She displays a negative Romberg sign. Coordination and gait  normal. She displays no Babinski's sign on the right side. She displays no Babinski's sign on the left side.  3/5 strength right deltoid. 4/5 right biceps Strength is normal all other muscle groups. Proprioception normal in all extremities Normal muscle tone Pain with palpation right shoulder.   Skin: Skin is warm and dry. No rash noted. No erythema. No pallor.  Psychiatric: She has a normal mood and affect. Her behavior is normal. Judgment and thought content normal.    Assessment/Plan: Claudia [name] presented with acute pain in the right shoulder and neck, and today with acute weakness in the right shoulder. Though she has significant spondylitic change which can account for weakness, the acute onset is highly unusual given the longstanding spondylitic changes dating back at least 2012. I will let her be discharged and contact her tomorrow. This is to make sure there is not some other entity causing this. Might consider an emg also. [place] turner considered, but nothing seen on the chest xrays.   Jamail Cullers L 05/06/2016, 7:11 PM

## 2016-05-06 NOTE — ED Notes (Signed)
Papers reviewed with patient and husband and they verbalize understanding and intent to follow up

## 2016-05-12 ENCOUNTER — Ambulatory Visit (INDEPENDENT_AMBULATORY_CARE_PROVIDER_SITE_OTHER): Payer: PPO | Admitting: Internal Medicine

## 2016-05-12 ENCOUNTER — Encounter: Payer: Self-pay | Admitting: Internal Medicine

## 2016-05-12 VITALS — BP 144/70 | HR 59 | Temp 97.7°F | Resp 16 | Ht 70.0 in | Wt 247.1 lb

## 2016-05-12 DIAGNOSIS — M4802 Spinal stenosis, cervical region: Secondary | ICD-10-CM

## 2016-05-12 DIAGNOSIS — K5903 Drug induced constipation: Secondary | ICD-10-CM | POA: Diagnosis not present

## 2016-05-12 DIAGNOSIS — T402X5A Adverse effect of other opioids, initial encounter: Secondary | ICD-10-CM

## 2016-05-12 DIAGNOSIS — I10 Essential (primary) hypertension: Secondary | ICD-10-CM

## 2016-05-12 MED ORDER — NALOXEGOL OXALATE 25 MG PO TABS: 25.0000 mg | ORAL_TABLET | Freq: Every day | ORAL | 3 refills | Status: DC

## 2016-05-12 NOTE — Progress Notes (Signed)
Pre visit review using our clinic review tool, if applicable. No additional management support is needed unless otherwise documented below in the visit note. 

## 2016-05-12 NOTE — Patient Instructions (Signed)

## 2016-05-12 NOTE — Progress Notes (Signed)
Subjective:  Patient ID: Claudia [name], female    DOB: 11-Nov-1933  Age: 82 y.o. MRN: [id number]  CC: Neck Pain   HPI Claudia [name] presents for f/up after a recent ER visit for right-sided neck pain that radiates into her RUE with diffuse weakness in her right arm. She sees NS later week to see if she would benefit from surgery to treat the spinal stenosis.  She also complains of constipation that has not responded well to Linzess or Amitiza.  Outpatient Medications Prior to Visit  Medication Sig Dispense Refill  . aspirin 81 MG tablet Take 81 mg by mouth daily.    . cetirizine (ZYRTEC) 10 MG tablet Take 10 mg by mouth daily.    [name] HYDROcodone-acetaminophen (NORCO/VICODIN) 5-325 MG tablet Take 1 tablet by mouth every 6 (six) hours as needed for moderate pain. 90 tablet 0  . ipratropium (ATROVENT) 0.03 % nasal spray Place 2 sprays into both nostrils every 12 (twelve) hours. (Patient taking differently: Place 2 sprays into both nostrils every 12 (twelve) hours as needed for rhinitis. ) 30 mL 12  . irbesartan-hydrochlorothiazide (AVALIDE) 300-12.5 MG tablet Take 1 tablet by mouth daily. 90 tablet 3  . orphenadrine (NORFLEX) 100 MG tablet Take 1 tablet (100 mg total) by mouth 2 (two) times daily. 30 tablet 0  . Pitavastatin Calcium (LIVALO) 2 MG TABS Take 1 tablet (2 mg total) by mouth daily. 90 tablet 3  . vitamin C (ASCORBIC ACID) 500 MG tablet Take 500 mg by mouth daily.    . vitamin E 400 UNIT capsule Take 400 Units by mouth daily.    [name] HYDROcodone-acetaminophen (NORCO/VICODIN) 5-325 MG tablet Take 1-2 tablets by mouth every 4 (four) hours as needed for moderate pain or severe pain. 20 tablet 0  . lubiprostone (AMITIZA) 24 MCG capsule Take 1 capsule (24 mcg total) by mouth 2 (two) times daily with a meal. 180 capsule 3  . naproxen (NAPROSYN) 375 MG tablet Take 1 tablet (375 mg total) by mouth 2 (two) times daily. 30 tablet 0  . naproxen sodium (ALEVE) 220 MG tablet Take 220 mg by  mouth 2 (two) times daily as needed (for shoulder pain).     No facility-administered medications prior to visit.     ROS Review of Systems  Constitutional: Negative for appetite change, fatigue and unexpected weight change.  HENT: Negative.  Negative for trouble swallowing.   Respiratory: Negative for cough, choking, chest tightness, shortness of breath and stridor.   Cardiovascular: Negative.  Negative for chest pain, palpitations and leg swelling.  Gastrointestinal: Positive for constipation. Negative for abdominal pain, anal bleeding, diarrhea, nausea and vomiting.  Genitourinary: Negative.  Negative for difficulty urinating, frequency and hematuria.  Musculoskeletal: Positive for neck pain. Negative for arthralgias, back pain, myalgias and neck stiffness.  Skin: Negative.   Allergic/Immunologic: Negative.   Neurological: Positive for weakness. Negative for dizziness, tremors and numbness.  Hematological: Negative.  Negative for adenopathy. Does not bruise/bleed easily.  Psychiatric/Behavioral: Negative.     Objective:  BP (!) 144/70 (BP Location: Left Arm, Patient Position: Sitting, Cuff Size: Normal)   Pulse (!) 59   Temp 97.7 F (36.5 C) (Oral)   Resp 16   Ht 5\' 10"  (1.778 m)   Wt 247 lb 2 oz (112.1 kg)   SpO2 98%   BMI 35.46 kg/m   BP Readings from Last 3 Encounters:  05/12/16 (!) 144/70  05/06/16 137/62  05/03/16 152/71    Wt Readings from  Last 3 Encounters:  05/12/16 247 lb 2 oz (112.1 kg)  05/06/16 238 lb (108 kg)  05/03/16 238 lb (108 kg)    Physical Exam  Constitutional: She is oriented to person, place, and time. No distress.  HENT:  Mouth/Throat: Oropharynx is clear and moist. No oropharyngeal exudate.  Eyes: Conjunctivae are normal. Right eye exhibits no discharge. Left eye exhibits no discharge. No scleral icterus.  Neck: Normal range of motion. Neck supple. No JVD present. No tracheal deviation present. No thyromegaly present.  Cardiovascular:  Normal rate, regular rhythm, normal heart sounds and intact distal pulses.  Exam reveals no gallop and no friction rub.   No murmur heard. Pulmonary/Chest: Effort normal and breath sounds normal. No stridor. No respiratory distress. She has no wheezes. She has no rales. She exhibits no tenderness.  Abdominal: Soft. Bowel sounds are normal. She exhibits no distension and no mass. There is no tenderness. There is no rebound and no guarding.  Musculoskeletal: Normal range of motion. She exhibits no edema, tenderness or deformity.       Cervical back: Normal. She exhibits normal range of motion, no tenderness, no bony tenderness, no swelling, no edema and no deformity.  Lymphadenopathy:    She has no cervical adenopathy.  Neurological: She is oriented to person, place, and time.  Skin: Skin is warm and dry. No rash noted. She is not diaphoretic. No erythema. No pallor.  Psychiatric: She has a normal mood and affect. Her behavior is normal. Judgment and thought content normal.    Lab Results  Component Value Date   WBC 5.4 05/06/2016   HGB 13.2 05/06/2016   HCT 40.3 05/06/2016   PLT 201 05/06/2016   GLUCOSE 96 05/06/2016   CHOL 148 08/24/2015   TRIG 53 08/24/2015   HDL 51 08/24/2015   LDLDIRECT 158.9 09/05/2012   LDLCALC 86 08/24/2015   ALT 15 01/01/2016   AST 19 01/01/2016   NA 140 05/06/2016   K 4.1 05/06/2016   CL 109 05/06/2016   CREATININE 1.07 (H) 05/06/2016   BUN 21 (H) 05/06/2016   CO2 25 05/06/2016   TSH 2.91 03/29/2016   HGBA1C 5.8 (H) 08/24/2015    Dg Chest 2 View  Result Date: 05/06/2016 CLINICAL DATA:  Chest and right-sided shoulder pain for the past 3 days. EXAM: CHEST  2 VIEW COMPARISON:  PA and lateral chest x-ray of May 03, 2016 FINDINGS: The lungs are borderline hypoinflated. There is no focal infiltrate. The infant interstitial markings are chronically increased in the left infrahilar region. The heart is top-normal in size. The pulmonary vascularity is  normal. There is calcification in the wall of the aortic arch. There is tortuosity of the descending thoracic aorta. There is multilevel degenerative disc disease of the thoracic spine. IMPRESSION: Mild chronic bronchitic changes.  No alveolar pneumonia nor CHF. Aortic atherosclerosis. Electronically Signed   By: David  Martinique M.D.   On: 05/06/2016 10:10   Ct Cervical Spine Wo Contrast  Result Date: 05/06/2016 CLINICAL DATA:  Initial evaluation for right-sided neck pain and right arm pain with weakness for 1 day. No trauma. EXAM: CT CERVICAL SPINE WITHOUT CONTRAST TECHNIQUE: Multidetector CT imaging of the cervical spine was performed without intravenous contrast. Multiplanar CT image reconstructions were also generated. COMPARISON:  None available. FINDINGS: Alignment: Straightening with slight reversal of the normal cervical lordosis, apex at C5-6. Skull base and vertebrae: Skullbase intact. There is asymmetry with shouldering along the right aspect of the base of the dens, which  articulates with the right lateral mass of C1 (series 201, image 31). Subsequent decreased height of the right lateral mass of C2. These changes are likely congenital in nature. The left lateral mass of C1 has a normal configuration. Anterior and posterior rings of C1 are normal and intact. Remainder of the C2 vertebral body within normal limits. Vertebral body heights are maintained. No evidence for acute fracture. Visualized soft tissues of the neck Soft tissues and spinal canal: Demonstrate no acute abnormality. No prevertebral edema or swelling. Thyroid within normal limits. Vascular calcifications noted about the carotid bifurcations. Disc levels: C2-3: Mild disc bulge with right greater than left uncovertebral spurring. Advanced bilateral facet arthropathy. There is moderate bony foraminal stenosis bilaterally, right worse than left. No significant canal stenosis. C3-4: Degenerative intervertebral disc space narrowing with  bilateral uncovertebral spurring, left greater than right. Advanced left-sided facet arthrosis, with more mild right-sided facet arthropathy. Fairly severe bilateral foraminal stenosis, left greater than right. No significant canal narrowing. C4-5: Diffuse degenerative intervertebral disc space narrowing with endplate osteophytosis and uncovertebral spurring. Bilateral facet arthrosis. No significant canal narrowing. Fairly severe bilateral bony foraminal narrowing. C5-6: Diffuse degenerative intervertebral disc space narrowing with endplate sclerosis and osteophytosis. Bilateral uncovertebral spurring. Discal and/or ligamentous calcification noted posterior to the C5-6 interspace. Moderate canal stenosis. Moderate bilateral foraminal narrowing, left worse than right. C6-7: Diffuse degenerative intervertebral disc space narrowing with endplate sclerosis. Bilateral uncovertebral spurring. Mild to moderate bilateral foraminal narrowing. No significant canal stenosis. C7-T1: Mild bilateral uncovertebral hypertrophy. No significant stenosis. Visualized portions of the upper thoracic spine within normal limits. Upper chest: Visualized upper chest is clear without acute abnormality. IMPRESSION: 1. No acute abnormality within the cervical spine. 2. Advanced multilevel degenerative spondylolysis and facet arthrosis as detailed above. Resultant moderate canal stenosis at the C5-6 level. Fairly severe bilateral foraminal narrowing, primarily within the upper cervical spine related to uncovertebral and facet disease. Please see above report for a full description of these findings. 3. 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 Electronically Signed   By: Jeannine Boga M.D.   On: 05/06/2016 14:45   Mr Cervical Spine Wo Contrast  Result Date: 05/06/2016 CLINICAL DATA:  Acute presentation with worsening right-sided neck pain radiating to the right shoulder over the last  2 days. EXAM: MRI CERVICAL SPINE WITHOUT CONTRAST TECHNIQUE: Multiplanar, multisequence MR imaging of the cervical spine was performed. No intravenous contrast was administered. COMPARISON:  CT myelogram 11/27/2010 FINDINGS: Alignment: Straightening of the normal cervical lordosis. 2 mm anterolisthesis C7-T1. Vertebrae: No fracture or primary bone lesion. Diffusely heterogeneous marrow pattern, quite likely benign. Cord: No primary cord lesion. Cord deformity due to stenosis as outlined at levels below. Some abnormal cord signal at C4-5 and C5-6 secondary to this. Posterior Fossa, vertebral arteries, paraspinal tissues: Negative Disc levels: Foramen magnum and C1-2: Normal C2-3: No disc pathology. Facet arthropathy on the right. Mild foraminal narrowing on the right. C3-4: Spondylosis with endplate osteophytes and bulging of the disc. Facet arthropathy left worse than right. Effacement of the subarachnoid space with slight deformity of the left side of the cord. No abnormal cord signal. Foraminal narrowing that could affect the C4 nerve roots. C4-5: Spondylosis with endplate osteophytes and bulging of the disc. Effacement of the subarachnoid space with slight deformity the cord more on the left. Some abnormal T2 signal within the cord more on the left. Facet arthropathy on the left. Foraminal stenosis bilaterally that could affect the C5 nerve roots. C5-6:  Spondylosis with endplate osteophytes and bulging disc material more prominent towards the left. Facet arthropathy bilaterally. Spinal stenosis with effacement of the subarachnoid space and flattening of the left side of the cord. Some abnormal T2 signal in the cord on the left. Foraminal stenosis could compress either C6 nerve root. C6-7: Endplate osteophytes and mild bulging of the disc. No central canal stenosis. Foramina sufficiently patent. C7-T1: Facet arthropathy with 2 mm of anterolisthesis. Mild bulging of the disc. No compressive stenosis. IMPRESSION:  Degenerative spondylosis with multifactorial stenosis at C3-4, C4-5 and C5-6. There is effacement of the subarachnoid space and deformity of the cord left more than right in this region. There is some abnormal T2 signal in the cord more on the left. There is foraminal stenosis bilaterally at these 3 levels. Electronically Signed   By: Nelson Chimes M.D.   On: 05/06/2016 11:26    Assessment & Plan:   Claudia [name] was seen today for neck pain.  Diagnoses and all orders for this visit:  Spinal stenosis in cervical region- Her pain is well controlled with norco and norflex  Essential hypertension, benign- her BP is well controlled  Therapeutic opioid-induced constipation (OIC)- will try movantik to treat this -     naloxegol oxalate (MOVANTIK) 25 MG TABS tablet; Take 1 tablet (25 mg total) by mouth daily.   I have discontinued Ms. Azbill's lubiprostone, naproxen sodium, and naproxen. I am also having her start on naloxegol oxalate. Additionally, I am having her maintain her cetirizine, aspirin, vitamin C, vitamin E, Pitavastatin Calcium, irbesartan-hydrochlorothiazide, ipratropium, HYDROcodone-acetaminophen, and orphenadrine.  Meds ordered this encounter  Medications  . naloxegol oxalate (MOVANTIK) 25 MG TABS tablet    Sig: Take 1 tablet (25 mg total) by mouth daily.    Dispense:  90 tablet    Refill:  3     Follow-up: Return in about 4 months (around 09/09/2016).  [name], MD

## 2016-05-14 ENCOUNTER — Other Ambulatory Visit: Payer: Self-pay | Admitting: Neurosurgery

## 2016-05-14 DIAGNOSIS — M25511 Pain in right shoulder: Secondary | ICD-10-CM | POA: Diagnosis not present

## 2016-05-14 DIAGNOSIS — M4722 Other spondylosis with radiculopathy, cervical region: Secondary | ICD-10-CM | POA: Diagnosis not present

## 2016-05-17 ENCOUNTER — Ambulatory Visit (INDEPENDENT_AMBULATORY_CARE_PROVIDER_SITE_OTHER): Payer: PPO | Admitting: Nurse Practitioner

## 2016-05-17 ENCOUNTER — Other Ambulatory Visit (HOSPITAL_COMMUNITY): Payer: Self-pay | Admitting: Neurosurgery

## 2016-05-17 ENCOUNTER — Encounter: Payer: Self-pay | Admitting: Nurse Practitioner

## 2016-05-17 VITALS — BP 122/80 | HR 74 | Ht 70.0 in | Wt 242.4 lb

## 2016-05-17 DIAGNOSIS — M25511 Pain in right shoulder: Secondary | ICD-10-CM

## 2016-05-17 DIAGNOSIS — R001 Bradycardia, unspecified: Secondary | ICD-10-CM | POA: Diagnosis not present

## 2016-05-17 NOTE — Patient Instructions (Addendum)
We will be checking the following labs today - NONE   Medication Instructions:    Continue with your current medicines.     Testing/Procedures To Be Arranged:  N/A  Follow-Up:   See us back as needed.     Other Special Instructions:   N/A    If you need a refill on your cardiac medications before your next appointment, please call your pharmacy.   Call the [facility] office at [phone] if you have any questions, problems or concerns.      

## 2016-05-17 NOTE — Progress Notes (Signed)
CARDIOLOGY OFFICE NOTE  Date:  05/17/2016    Claudia [name] Date of Birth: [birth year] Medical Record [id number]  PCP:  [name], MD  Cardiologist:  Harrington Challenger  Chief Complaint  Patient presents with  . Bradycardia    Follow up - seen for Dr. Harrington Challenger    History of Present Illness: Claudia [name] is a 82 y.o. female who presents today for a 6 week check/follow up visit. Seen for Dr. Harrington Challenger.   She was seen 6 weeks ago as a new patient after referral from PCP for abnormal Holter. Had been seen by PCP back in September with edema and found to be bradycardic. No dizziness noted.  Holter showed HR from 32 to 91 - average was 58. Longest pause 2.4 seconds. Rare PAC/PVC. Slow HR noted in the middle of the night. Sleep study was recommended - however she did not wish to proceed.  Echo also done and was ok. Was to return here prn.   Comes in today. Here alone. She is not really sure why she is here today. She was planning on having surgery with Dr. Christella Noa tomorrow but she has postponed because her husband will not be able to be with her. Planned for next month. She will be having an anterior cervical decompression/discectomy. She is not able to raise her right arm - has been like this for several weeks. No other neurological symptoms noted. She is not dizzy. No syncope. No chest pain. She is aware of her Holter and her echo results. She is not interested in having a sleep study.   Past Medical History:  Diagnosis Date  . Abnormality of gait   . Brachial neuritis or radiculitis NOS   . Degeneration of lumbar or lumbosacral intervertebral disc   . Essential hypertension, benign   . Gouty arthropathy   . Lumbago   . Obesity   . Osteoarthritis   . Pure hypercholesterolemia   . Unspecified hypothyroidism     Past Surgical History:  Procedure Laterality Date  . ABDOMINAL HYSTERECTOMY  03/13/2010  . BACK SURGERY    . LUMBAR LAMINECTOMY  03/13/2010  . right knee replacement  03/13/2010      Medications: Current Outpatient Prescriptions  Medication Sig Dispense Refill  . AMITIZA 24 MCG capsule Take 1 capsule by mouth 2 (two) times daily.  0  . aspirin 81 MG tablet Take 81 mg by mouth daily.    . cetirizine (ZYRTEC) 10 MG tablet Take 10 mg by mouth daily.    [name] HYDROcodone-acetaminophen (NORCO/VICODIN) 5-325 MG tablet Take 1 tablet by mouth every 6 (six) hours as needed for moderate pain. 90 tablet 0  . ipratropium (ATROVENT) 0.03 % nasal spray Place 2 sprays into both nostrils every 12 (twelve) hours. (Patient taking differently: Place 2 sprays into both nostrils every 12 (twelve) hours as needed for rhinitis. ) 30 mL 12  . irbesartan-hydrochlorothiazide (AVALIDE) 300-12.5 MG tablet Take 1 tablet by mouth daily. 90 tablet 3  . naloxegol oxalate (MOVANTIK) 25 MG TABS tablet Take 1 tablet (25 mg total) by mouth daily. 90 tablet 3  . naproxen (NAPROSYN) 375 MG tablet Take 375 mg by mouth 2 (two) times daily.  0  . orphenadrine (NORFLEX) 100 MG tablet Take 1 tablet (100 mg total) by mouth 2 (two) times daily. 30 tablet 0  . Pitavastatin Calcium (LIVALO) 2 MG TABS Take 1 tablet (2 mg total) by mouth daily. 90 tablet 3  . vitamin C (ASCORBIC ACID)  500 MG tablet Take 500 mg by mouth daily.    . vitamin E 400 UNIT capsule Take 400 Units by mouth daily.     No current facility-administered medications for this visit.     Allergies: Allergies  Allergen Reactions  . Lipitor [Atorvastatin] Other (See Comments)    Muscle aches  . Trileptal [Oxcarbazepine] Other (See Comments)    confusion    Social History: The patient  reports that she has never smoked. She has never used smokeless tobacco. She reports that she does not drink alcohol or use drugs.   Family History: The patient's family history includes Alzheimer's disease in her mother; Cirrhosis in her brother; Diabetes in her sister; Heart disease in her father; Hypertension in her mother.   Review of Systems: Please see  the history of present illness.   Otherwise, the review of systems is positive for none.   All other systems are reviewed and negative.   Physical Exam: VS:  BP 122/80   Pulse 74   Ht 5\' 10"  (1.778 m)   Wt 242 lb 6.4 oz (110 kg)   SpO2 99% Comment: at rest  BMI 34.78 kg/m  .  BMI Body mass index is 34.78 kg/m.  Wt Readings from Last 3 Encounters:  05/17/16 242 lb 6.4 oz (110 kg)  05/12/16 247 lb 2 oz (112.1 kg)  05/06/16 238 lb (108 kg)    General: Pleasant. Elderly female who is alert and in no acute distress. Looks younger than her stated age.    HEENT: Normal.  Neck: Supple, no JVD, carotid bruits, or masses noted.  Cardiac: Regular rate and rhythm. No murmurs, rubs, or gallops. No edema.  Respiratory:  Lungs are clear to auscultation bilaterally with normal work of breathing.  GI: Soft and nontender.  MS: No deformity or atrophy. Gait and ROM intact. She uses a cane. She has difficulty raising the right arm. Her grips are strong and 2+.  Skin: Warm and dry. Color is normal.  Neuro:  Strength and sensation are intact and no gross focal deficits noted.  Psych: Alert, appropriate and with normal affect.   LABORATORY DATA:  EKG:  EKG is not ordered today.  Lab Results  Component Value Date   WBC 5.4 05/06/2016   HGB 13.2 05/06/2016   HCT 40.3 05/06/2016   PLT 201 05/06/2016   GLUCOSE 96 05/06/2016   CHOL 148 08/24/2015   TRIG 53 08/24/2015   HDL 51 08/24/2015   LDLDIRECT 158.9 09/05/2012   LDLCALC 86 08/24/2015   ALT 15 01/01/2016   AST 19 01/01/2016   NA 140 05/06/2016   K 4.1 05/06/2016   CL 109 05/06/2016   CREATININE 1.07 (H) 05/06/2016   BUN 21 (H) 05/06/2016   CO2 25 05/06/2016   TSH 2.91 03/29/2016   HGBA1C 5.8 (H) 08/24/2015    BNP (last 3 results)  Recent Labs  03/29/16 1641 05/06/16 0922  BNP 123.8* 111.1*    ProBNP (last 3 results)  Recent Labs  03/04/16 1049  PROBNP 199.0*     Other Studies Reviewed Today:  Echo Study  Conclusions from 03/2016  - Left ventricle: The cavity size was normal. Wall thickness was   increased in a pattern of moderate LVH. There was focal basal   hypertrophy. Systolic function was normal. The estimated ejection   fraction was in the range of 55% to 60%. Wall motion was normal;   there were no regional wall motion abnormalities. Left   ventricular  diastolic function parameters were normal for the   patient&'s age. - Aortic valve: There was trivial regurgitation. - Left atrium: The atrium was moderately dilated. - Pulmonary arteries: Systolic pressure was mildly increased. PA   peak pressure: 31 mm Hg (S).   Holter Study Highlights 02/2016    Minimal heart rate 32bpm during sleep (3am)  Average HR 58bpm  292 PVC's, 158PAC's  Consider sleep study   Candee Furbish, MD     Assessment/Plan: 1. Bradycardia - holter results noted.   2. Probable OSA - not interested in sleep study  3. Need for cervical spine surgery - ok to proceed from our standpoint. I have talked with Dr. Harrington Challenger by phone who agrees. Will be available as needed.   Current medicines are reviewed with the patient today.  The patient does not have concerns regarding medicines other than what has been noted above.  The following changes have been made:  See above.  Labs/ tests ordered today include:   No orders of the defined types were placed in this encounter.    Disposition:   FU with Dr. Harrington Challenger prn.   Patient is agreeable to this plan and will call if any problems develop in the interim.   Signed: Burtis Junes, RN, ANP-C 05/17/2016 2:54 PM  [facility] [address] [place], [place]  [address] Phone: [phone] Fax: [phone]

## 2016-05-18 ENCOUNTER — Encounter (HOSPITAL_COMMUNITY): Admission: RE | Payer: Self-pay | Source: Ambulatory Visit

## 2016-05-18 ENCOUNTER — Inpatient Hospital Stay (HOSPITAL_COMMUNITY): Admission: RE | Admit: 2016-05-18 | Payer: PPO | Source: Ambulatory Visit | Admitting: Neurosurgery

## 2016-05-18 ENCOUNTER — Ambulatory Visit (HOSPITAL_COMMUNITY): Payer: PPO

## 2016-05-18 SURGERY — ANTERIOR CERVICAL DECOMPRESSION/DISCECTOMY FUSION 2 LEVELS
Anesthesia: General

## 2016-05-19 ENCOUNTER — Ambulatory Visit (HOSPITAL_COMMUNITY)
Admission: RE | Admit: 2016-05-19 | Discharge: 2016-05-19 | Disposition: A | Discharge status: Home / Self Care | Payer: PPO | Source: Ambulatory Visit | Attending: Neurosurgery | Admitting: Neurosurgery

## 2016-05-19 DIAGNOSIS — M25511 Pain in right shoulder: Secondary | ICD-10-CM | POA: Diagnosis not present

## 2016-05-19 DIAGNOSIS — M75101 Unspecified rotator cuff tear or rupture of right shoulder, not specified as traumatic: Secondary | ICD-10-CM | POA: Insufficient documentation

## 2016-06-04 DIAGNOSIS — M75121 Complete rotator cuff tear or rupture of right shoulder, not specified as traumatic: Secondary | ICD-10-CM | POA: Diagnosis not present

## 2016-07-09 DIAGNOSIS — M5412 Radiculopathy, cervical region: Secondary | ICD-10-CM | POA: Diagnosis not present

## 2016-08-16 ENCOUNTER — Ambulatory Visit (INDEPENDENT_AMBULATORY_CARE_PROVIDER_SITE_OTHER): Payer: PPO | Admitting: Internal Medicine

## 2016-08-16 ENCOUNTER — Telehealth: Payer: Self-pay | Admitting: Internal Medicine

## 2016-08-16 ENCOUNTER — Encounter: Payer: Self-pay | Admitting: Internal Medicine

## 2016-08-16 VITALS — BP 144/64 | HR 57 | Temp 98.8°F | Resp 16 | Ht 70.0 in | Wt 244.0 lb

## 2016-08-16 DIAGNOSIS — K5909 Other constipation: Secondary | ICD-10-CM

## 2016-08-16 DIAGNOSIS — J301 Allergic rhinitis due to pollen: Secondary | ICD-10-CM | POA: Insufficient documentation

## 2016-08-16 DIAGNOSIS — H6981 Other specified disorders of Eustachian tube, right ear: Secondary | ICD-10-CM

## 2016-08-16 MED ORDER — LINACLOTIDE 290 MCG PO CAPS: 290.0000 ug | ORAL_CAPSULE | Freq: Every day | ORAL | 1 refills | Status: DC

## 2016-08-16 MED ORDER — CETIRIZINE HCL 10 MG PO TABS: 10.0000 mg | ORAL_TABLET | Freq: Every day | ORAL | 1 refills | Status: DC

## 2016-08-16 MED ORDER — METHYLPREDNISOLONE 4 MG PO TBPK: ORAL_TABLET | ORAL | 0 refills | Status: DC

## 2016-08-16 NOTE — Telephone Encounter (Signed)
Pt called in and said that she has major ear pain and would like to be worked in today?

## 2016-08-16 NOTE — Telephone Encounter (Signed)
Pt advised to come in.  Front desk informed of same.

## 2016-08-16 NOTE — Telephone Encounter (Signed)
yes

## 2016-08-16 NOTE — Progress Notes (Signed)
Pre visit review using our clinic review tool, if applicable. No additional management support is needed unless otherwise documented below in the visit note. 

## 2016-08-16 NOTE — Telephone Encounter (Signed)
Do you want to work her in today?

## 2016-08-16 NOTE — Progress Notes (Signed)
Subjective:  Patient ID: Claudia [name], female    DOB: [birth year]  Age: 83 y.o. MRN: [id number]  CC: Allergic Rhinitis    HPI RANEY HEMBERGER presents for complaints of right ear pressure, runny nose, sneezing, and nasal congestion for several days.  She complains that Livalo is too expensive and she wants to stop taking it.  He complains of chronic constipation and wants to take Linzess instead of Amitiza for symptom relief.  Outpatient Medications Prior to Visit  Medication Sig Dispense Refill  . aspirin 81 MG tablet Take 81 mg by mouth daily.    [name] HYDROcodone-acetaminophen (NORCO/VICODIN) 5-325 MG tablet Take 1 tablet by mouth every 6 (six) hours as needed for moderate pain. 90 tablet 0  . ipratropium (ATROVENT) 0.03 % nasal spray Place 2 sprays into both nostrils every 12 (twelve) hours. (Patient taking differently: Place 2 sprays into both nostrils every 12 (twelve) hours as needed for rhinitis. ) 30 mL 12  . irbesartan-hydrochlorothiazide (AVALIDE) 300-12.5 MG tablet Take 1 tablet by mouth daily. 90 tablet 3  . vitamin C (ASCORBIC ACID) 500 MG tablet Take 500 mg by mouth daily.    . vitamin E 400 UNIT capsule Take 400 Units by mouth daily.    . AMITIZA 24 MCG capsule Take 1 capsule by mouth 2 (two) times daily.  0  . cetirizine (ZYRTEC) 10 MG tablet Take 10 mg by mouth daily.    . Pitavastatin Calcium (LIVALO) 2 MG TABS Take 1 tablet (2 mg total) by mouth daily. 90 tablet 3  . naloxegol oxalate (MOVANTIK) 25 MG TABS tablet Take 1 tablet (25 mg total) by mouth daily. 90 tablet 3  . naproxen (NAPROSYN) 375 MG tablet Take 375 mg by mouth 2 (two) times daily.  0  . orphenadrine (NORFLEX) 100 MG tablet Take 1 tablet (100 mg total) by mouth 2 (two) times daily. 30 tablet 0   No facility-administered medications prior to visit.     ROS Review of Systems  Constitutional: Negative for chills and diaphoresis.  HENT: Positive for congestion, ear pain, postnasal drip and  rhinorrhea. Negative for facial swelling, sinus pain, sinus pressure, sneezing, sore throat and trouble swallowing.   Eyes: Negative for visual disturbance.  Respiratory: Negative for cough, chest tightness, shortness of breath and stridor.   Cardiovascular: Negative for chest pain, palpitations and leg swelling.  Gastrointestinal: Positive for constipation. Negative for abdominal pain, diarrhea, nausea and vomiting.  Endocrine: Negative.   Genitourinary: Negative.   Musculoskeletal: Positive for arthralgias. Negative for back pain and neck pain.  Skin: Negative.   Allergic/Immunologic: Negative.   Neurological: Negative.  Negative for dizziness, weakness and numbness.  Hematological: Negative.  Negative for adenopathy. Does not bruise/bleed easily.  Psychiatric/Behavioral: Negative.     Objective:  BP (!) 144/64 (BP Location: Left Arm, Patient Position: Sitting, Cuff Size: Normal)   Pulse (!) 57   Temp 98.8 F (37.1 C) (Oral)   Resp 16   Ht 5\' 10"  (1.778 m)   Wt 244 lb (110.7 kg)   SpO2 98%   BMI 35.01 kg/m   BP Readings from Last 3 Encounters:  08/16/16 (!) 144/64  05/17/16 122/80  05/12/16 (!) 144/70    Wt Readings from Last 3 Encounters:  08/16/16 244 lb (110.7 kg)  05/17/16 242 lb 6.4 oz (110 kg)  05/12/16 247 lb 2 oz (112.1 kg)    Physical Exam  Constitutional: She is oriented to person, place, and time. No distress.  HENT:  Right Ear: Hearing, tympanic membrane, external ear and ear canal normal.  Left Ear: Hearing, tympanic membrane, external ear and ear canal normal.  Nose: Mucosal edema and rhinorrhea present. No nasal deformity or nasal septal hematoma. Right sinus exhibits no maxillary sinus tenderness and no frontal sinus tenderness. Left sinus exhibits no maxillary sinus tenderness and no frontal sinus tenderness.  Mouth/Throat: Oropharynx is clear and moist and mucous membranes are normal. Mucous membranes are not pale, not dry and not cyanotic. No oral  lesions. No trismus in the jaw. No uvula swelling. No oropharyngeal exudate, posterior oropharyngeal edema, posterior oropharyngeal erythema or tonsillar abscesses.  Eyes: Conjunctivae are normal. Right eye exhibits no discharge. Left eye exhibits no discharge. No scleral icterus.  Neck: Normal range of motion. Neck supple. No JVD present. No tracheal deviation present. No thyromegaly present.  Cardiovascular: Normal rate, regular rhythm, normal heart sounds and intact distal pulses.  Exam reveals no gallop and no friction rub.   No murmur heard. Pulmonary/Chest: Effort normal and breath sounds normal. No stridor. No respiratory distress. She has no wheezes. She has no rales. She exhibits no tenderness.  Abdominal: Soft. Bowel sounds are normal. She exhibits no distension and no mass. There is no tenderness. There is no rebound and no guarding.  Musculoskeletal: She exhibits no edema, tenderness or deformity.  Lymphadenopathy:    She has no cervical adenopathy.  Neurological: She is oriented to person, place, and time.  Skin: Skin is warm and dry. No rash noted. She is not diaphoretic. No erythema. No pallor.  Vitals reviewed.   Lab Results  Component Value Date   WBC 5.4 05/06/2016   HGB 13.2 05/06/2016   HCT 40.3 05/06/2016   PLT 201 05/06/2016   GLUCOSE 96 05/06/2016   CHOL 148 08/24/2015   TRIG 53 08/24/2015   HDL 51 08/24/2015   LDLDIRECT 158.9 09/05/2012   LDLCALC 86 08/24/2015   ALT 15 01/01/2016   AST 19 01/01/2016   NA 140 05/06/2016   K 4.1 05/06/2016   CL 109 05/06/2016   CREATININE 1.07 (H) 05/06/2016   BUN 21 (H) 05/06/2016   CO2 25 05/06/2016   TSH 2.91 03/29/2016   HGBA1C 5.8 (H) 08/24/2015    Mr Shoulder Right Wo Contrast  Result Date: 05/20/2016 CLINICAL DATA:  Right shoulder pain with limited range of motion. EXAM: MRI OF THE RIGHT SHOULDER WITHOUT CONTRAST TECHNIQUE: Multiplanar, multisequence MR imaging of the shoulder was performed. No intravenous  contrast was administered. COMPARISON:  None. FINDINGS: Rotator cuff: There is a small focal full-thickness tear of the distal supraspinous tendon measuring 5 mm in diameter. The rotator cuff is otherwise intact. Muscles: Slight atrophy of the supraspinous, infraspinatus and teres minor muscles. Biceps long head:  Properly located and intact. Acromioclavicular Joint: Moderate arthropathy of the acromioclavicular joint. Type 2 acromion. Glenohumeral Joint: No joint effusion. No chondral defect. Labrum:  Intact. Bones: Cystic degenerative changes of the greater and lesser tuberosities. Other: None. IMPRESSION: Small focal full-thickness tear of the distal supraspinous tendon. Electronically Signed   By: Lorriane Shire M.D.   On: 05/20/2016 10:58    Assessment & Plan:   Ertha was seen today for allergic rhinitis .  Diagnoses and all orders for this visit:  Constipation, chronic -     linaclotide (LINZESS) 290 MCG CAPS capsule; Take 1 capsule (290 mcg total) by mouth daily before breakfast.  Eustachian tube dysfunction, right- will treat with a course of methylprednisolone and restart Zyrtec -  cetirizine (ZYRTEC) 10 MG tablet; Take 1 tablet (10 mg total) by mouth daily. -     methylPREDNISolone (MEDROL DOSEPAK) 4 MG TBPK tablet; TAKE AS DIRECTED  Acute nonseasonal allergic rhinitis due to pollen -     methylPREDNISolone (MEDROL DOSEPAK) 4 MG TBPK tablet; TAKE AS DIRECTED   I have discontinued Ms. Boydstun's Pitavastatin Calcium, orphenadrine, naloxegol oxalate, AMITIZA, and naproxen. I have also changed her cetirizine. Additionally, I am having her start on linaclotide and methylPREDNISolone. Lastly, I am having her maintain her aspirin, vitamin C, vitamin E, irbesartan-hydrochlorothiazide, ipratropium, and HYDROcodone-acetaminophen.  Meds ordered this encounter  Medications  . linaclotide (LINZESS) 290 MCG CAPS capsule    Sig: Take 1 capsule (290 mcg total) by mouth daily before  breakfast.    Dispense:  90 capsule    Refill:  1  . cetirizine (ZYRTEC) 10 MG tablet    Sig: Take 1 tablet (10 mg total) by mouth daily.    Dispense:  90 tablet    Refill:  1  . methylPREDNISolone (MEDROL DOSEPAK) 4 MG TBPK tablet    Sig: TAKE AS DIRECTED    Dispense:  21 tablet    Refill:  0     Follow-up: Return if symptoms worsen or fail to improve.  [name], MD

## 2016-08-16 NOTE — Patient Instructions (Signed)

## 2016-09-08 ENCOUNTER — Other Ambulatory Visit (INDEPENDENT_AMBULATORY_CARE_PROVIDER_SITE_OTHER): Payer: PPO

## 2016-09-08 ENCOUNTER — Ambulatory Visit (INDEPENDENT_AMBULATORY_CARE_PROVIDER_SITE_OTHER): Payer: PPO | Admitting: Internal Medicine

## 2016-09-08 ENCOUNTER — Encounter: Payer: Self-pay | Admitting: Internal Medicine

## 2016-09-08 VITALS — BP 150/70 | HR 53 | Temp 97.6°F | Resp 16 | Ht 70.0 in | Wt 240.0 lb

## 2016-09-08 DIAGNOSIS — M47817 Spondylosis without myelopathy or radiculopathy, lumbosacral region: Secondary | ICD-10-CM

## 2016-09-08 DIAGNOSIS — M170 Bilateral primary osteoarthritis of knee: Secondary | ICD-10-CM | POA: Diagnosis not present

## 2016-09-08 DIAGNOSIS — E7800 Pure hypercholesterolemia, unspecified: Secondary | ICD-10-CM | POA: Diagnosis not present

## 2016-09-08 DIAGNOSIS — I10 Essential (primary) hypertension: Secondary | ICD-10-CM

## 2016-09-08 DIAGNOSIS — M961 Postlaminectomy syndrome, not elsewhere classified: Secondary | ICD-10-CM | POA: Diagnosis not present

## 2016-09-08 DIAGNOSIS — E038 Other specified hypothyroidism: Secondary | ICD-10-CM

## 2016-09-08 LAB — LIPID PANEL
Cholesterol: 181 mg/dL (ref 0–200)
HDL: 61.8 mg/dL (ref >39.00)
LDL Cholesterol: 106 mg/dL — ABNORMAL HIGH (ref 0–99)
NONHDL: 119.28
Total CHOL/HDL Ratio: 3
Triglycerides: 67 mg/dL (ref 0.0–149.0)
VLDL: 13.4 mg/dL (ref 0.0–40.0)

## 2016-09-08 LAB — BASIC METABOLIC PANEL
BUN: 19 mg/dL (ref 6–23)
CHLORIDE: 104 meq/L (ref 96–112)
CO2: 28 mEq/L (ref 19–32)
CREATININE: 0.95 mg/dL (ref 0.40–1.20)
Calcium: 10.3 mg/dL (ref 8.4–10.5)
GFR: 72.69 mL/min (ref >60.00)
Glucose, Bld: 96 mg/dL (ref 70–99)
POTASSIUM: 4.5 meq/L (ref 3.5–5.1)
SODIUM: 142 meq/L (ref 135–145)

## 2016-09-08 LAB — TSH: TSH: 5.27 u[IU]/mL — AB (ref 0.35–4.50)

## 2016-09-08 MED ORDER — HYDROCODONE-ACETAMINOPHEN 5-325 MG PO TABS: 1.0000 | ORAL_TABLET | Freq: Four times a day (QID) | ORAL | 0 refills | Status: DC | PRN

## 2016-09-08 NOTE — Progress Notes (Signed)
Subjective:  Patient ID: Claudia [name], female    DOB: 21-Apr-1934  Age: 83 y.o. MRN: [id number]  CC: Hypertension; Hypothyroidism; Hyperlipidemia; and Osteoarthritis   HPI REEYA BOUND presents for f/up - She offers no new or different complaints.  Outpatient Medications Prior to Visit  Medication Sig Dispense Refill  . aspirin 81 MG tablet Take 81 mg by mouth daily.    . cetirizine (ZYRTEC) 10 MG tablet Take 1 tablet (10 mg total) by mouth daily. 90 tablet 1  . ipratropium (ATROVENT) 0.03 % nasal spray Place 2 sprays into both nostrils every 12 (twelve) hours. (Patient taking differently: Place 2 sprays into both nostrils every 12 (twelve) hours as needed for rhinitis. ) 30 mL 12  . irbesartan-hydrochlorothiazide (AVALIDE) 300-12.5 MG tablet Take 1 tablet by mouth daily. 90 tablet 3  . linaclotide (LINZESS) 290 MCG CAPS capsule Take 1 capsule (290 mcg total) by mouth daily before breakfast. 90 capsule 1  . vitamin C (ASCORBIC ACID) 500 MG tablet Take 500 mg by mouth daily.    . vitamin E 400 UNIT capsule Take 400 Units by mouth daily.    [name] HYDROcodone-acetaminophen (NORCO/VICODIN) 5-325 MG tablet Take 1 tablet by mouth every 6 (six) hours as needed for moderate pain. 90 tablet 0  . methylPREDNISolone (MEDROL DOSEPAK) 4 MG TBPK tablet TAKE AS DIRECTED 21 tablet 0   No facility-administered medications prior to visit.     ROS Review of Systems  Constitutional: Negative for appetite change, chills, diaphoresis, fatigue and unexpected weight change.  HENT: Negative.   Eyes: Negative for visual disturbance.  Respiratory: Negative for cough, chest tightness, shortness of breath and stridor.   Cardiovascular: Negative for chest pain, palpitations and leg swelling.  Gastrointestinal: Positive for constipation. Negative for abdominal pain, blood in stool, diarrhea, nausea and vomiting.  Endocrine: Negative.  Negative for cold intolerance and heat intolerance.  Genitourinary:  Negative.   Musculoskeletal: Positive for arthralgias, back pain and neck pain. Negative for joint swelling.  Allergic/Immunologic: Negative.   Neurological: Negative.  Negative for dizziness and weakness.  Hematological: Negative.  Negative for adenopathy. Does not bruise/bleed easily.  Psychiatric/Behavioral: Negative.     Objective:  BP (!) 150/70 (BP Location: Left Arm, Patient Position: Sitting, Cuff Size: Large)   Pulse (!) 53   Temp 97.6 F (36.4 C) (Oral)   Resp 16   Ht 5\' 10"  (1.778 m)   Wt 240 lb (108.9 kg)   SpO2 92%   BMI 34.44 kg/m   BP Readings from Last 3 Encounters:  09/08/16 (!) 150/70  08/16/16 (!) 144/64  05/17/16 122/80    Wt Readings from Last 3 Encounters:  09/08/16 240 lb (108.9 kg)  08/16/16 244 lb (110.7 kg)  05/17/16 242 lb 6.4 oz (110 kg)    Physical Exam  Constitutional: She is oriented to person, place, and time. No distress.  HENT:  Nose: Nose normal.  Mouth/Throat: Oropharynx is clear and moist. No oropharyngeal exudate.  Eyes: Conjunctivae are normal. Right eye exhibits no discharge. Left eye exhibits no discharge. No scleral icterus.  Neck: Normal range of motion. Neck supple. No JVD present. No tracheal deviation present. No thyromegaly present.  Cardiovascular: Normal rate, regular rhythm, normal heart sounds and intact distal pulses.  Exam reveals no gallop and no friction rub.   No murmur heard. Pulmonary/Chest: Effort normal and breath sounds normal. No stridor. No respiratory distress. She has no wheezes. She has no rales. She exhibits no tenderness.  Abdominal:  Soft. Bowel sounds are normal. She exhibits no distension and no mass. There is no tenderness. There is no rebound and no guarding.  Musculoskeletal: Normal range of motion. She exhibits no edema, tenderness or deformity.  Lymphadenopathy:    She has no cervical adenopathy.  Neurological: She is oriented to person, place, and time.  Skin: Skin is warm and dry. No rash  noted. She is not diaphoretic. No erythema. No pallor.  Vitals reviewed.   Lab Results  Component Value Date   WBC 5.4 05/06/2016   HGB 13.2 05/06/2016   HCT 40.3 05/06/2016   PLT 201 05/06/2016   GLUCOSE 96 09/08/2016   CHOL 181 09/08/2016   TRIG 67.0 09/08/2016   HDL 61.80 09/08/2016   LDLDIRECT 158.9 09/05/2012   LDLCALC 106 (H) 09/08/2016   ALT 15 01/01/2016   AST 19 01/01/2016   NA 142 09/08/2016   K 4.5 09/08/2016   CL 104 09/08/2016   CREATININE 0.95 09/08/2016   BUN 19 09/08/2016   CO2 28 09/08/2016   TSH 5.27 (H) 09/08/2016   HGBA1C 5.8 (H) 08/24/2015    Mr Shoulder Right Wo Contrast  Result Date: 05/20/2016 CLINICAL DATA:  Right shoulder pain with limited range of motion. EXAM: MRI OF THE RIGHT SHOULDER WITHOUT CONTRAST TECHNIQUE: Multiplanar, multisequence MR imaging of the shoulder was performed. No intravenous contrast was administered. COMPARISON:  None. FINDINGS: Rotator cuff: There is a small focal full-thickness tear of the distal supraspinous tendon measuring 5 mm in diameter. The rotator cuff is otherwise intact. Muscles: Slight atrophy of the supraspinous, infraspinatus and teres minor muscles. Biceps long head:  Properly located and intact. Acromioclavicular Joint: Moderate arthropathy of the acromioclavicular joint. Type 2 acromion. Glenohumeral Joint: No joint effusion. No chondral defect. Labrum:  Intact. Bones: Cystic degenerative changes of the greater and lesser tuberosities. Other: None. IMPRESSION: Small focal full-thickness tear of the distal supraspinous tendon. Electronically Signed   By: Lorriane Shire M.D.   On: 05/20/2016 10:58    Assessment & Plan:   Maryjayne was seen today for hypertension, hypothyroidism, hyperlipidemia and osteoarthritis.  Diagnoses and all orders for this visit:  Primary osteoarthritis of both knees -     Discontinue: HYDROcodone-acetaminophen (NORCO/VICODIN) 5-325 MG tablet; Take 1 tablet by mouth every 6 (six) hours  as needed for moderate pain. -     Discontinue: HYDROcodone-acetaminophen (NORCO/VICODIN) 5-325 MG tablet; Take 1 tablet by mouth every 6 (six) hours as needed for moderate pain. -     Discontinue: HYDROcodone-acetaminophen (NORCO/VICODIN) 5-325 MG tablet; Take 1 tablet by mouth every 6 (six) hours as needed for moderate pain. -     HYDROcodone-acetaminophen (NORCO/VICODIN) 5-325 MG tablet; Take 1 tablet by mouth every 6 (six) hours as needed for moderate pain. -     Ambulatory referral to Orthopedic Surgery  Essential hypertension, benign- Her blood pressures well controlled, electrolytes and renal function are normal. -     Basic metabolic panel; Future  Pure hypercholesterolemia- she has achieved her LDL goal, statin therapy is not indicated -     Lipid panel; Future  Other specified hypothyroidism- Her TSH is in an acceptable range and she is asymptomatic so will continue levothyroxine. -     TSH; Future  Postlaminectomy syndrome, lumbar region -     Discontinue: HYDROcodone-acetaminophen (NORCO/VICODIN) 5-325 MG tablet; Take 1 tablet by mouth every 6 (six) hours as needed for moderate pain. -     Discontinue: HYDROcodone-acetaminophen (NORCO/VICODIN) 5-325 MG tablet; Take 1 tablet by  mouth every 6 (six) hours as needed for moderate pain. -     Discontinue: HYDROcodone-acetaminophen (NORCO/VICODIN) 5-325 MG tablet; Take 1 tablet by mouth every 6 (six) hours as needed for moderate pain. -     HYDROcodone-acetaminophen (NORCO/VICODIN) 5-325 MG tablet; Take 1 tablet by mouth every 6 (six) hours as needed for moderate pain.  Lumbosacral spondylosis without myelopathy -     Discontinue: HYDROcodone-acetaminophen (NORCO/VICODIN) 5-325 MG tablet; Take 1 tablet by mouth every 6 (six) hours as needed for moderate pain. -     Discontinue: HYDROcodone-acetaminophen (NORCO/VICODIN) 5-325 MG tablet; Take 1 tablet by mouth every 6 (six) hours as needed for moderate pain. -     Discontinue:  HYDROcodone-acetaminophen (NORCO/VICODIN) 5-325 MG tablet; Take 1 tablet by mouth every 6 (six) hours as needed for moderate pain. -     HYDROcodone-acetaminophen (NORCO/VICODIN) 5-325 MG tablet; Take 1 tablet by mouth every 6 (six) hours as needed for moderate pain.   I have discontinued Ms. Valbuena's methylPREDNISolone. I am also having her maintain her aspirin, vitamin C, vitamin E, irbesartan-hydrochlorothiazide, ipratropium, linaclotide, cetirizine, and HYDROcodone-acetaminophen.  Meds ordered this encounter  Medications  . DISCONTD: HYDROcodone-acetaminophen (NORCO/VICODIN) 5-325 MG tablet    Sig: Take 1 tablet by mouth every 6 (six) hours as needed for moderate pain.    Dispense:  90 tablet    Refill:  0    Fill on or after 09/08/16  . DISCONTD: HYDROcodone-acetaminophen (NORCO/VICODIN) 5-325 MG tablet    Sig: Take 1 tablet by mouth every 6 (six) hours as needed for moderate pain.    Dispense:  90 tablet    Refill:  0    Fill on or after 10/09/16  . DISCONTD: HYDROcodone-acetaminophen (NORCO/VICODIN) 5-325 MG tablet    Sig: Take 1 tablet by mouth every 6 (six) hours as needed for moderate pain.    Dispense:  90 tablet    Refill:  0    Fill on or after 11/08/16  . HYDROcodone-acetaminophen (NORCO/VICODIN) 5-325 MG tablet    Sig: Take 1 tablet by mouth every 6 (six) hours as needed for moderate pain.    Dispense:  90 tablet    Refill:  0    Fill on or after 12/09/16     Follow-up: Return in about 4 months (around 01/08/2017).  [name], MD

## 2016-09-08 NOTE — Progress Notes (Signed)
Pre visit review using our clinic review tool, if applicable. No additional management support is needed unless otherwise documented below in the visit note. 

## 2016-09-08 NOTE — Patient Instructions (Signed)

## 2016-09-20 ENCOUNTER — Encounter: Payer: Self-pay | Admitting: Neurology

## 2016-09-20 ENCOUNTER — Ambulatory Visit (INDEPENDENT_AMBULATORY_CARE_PROVIDER_SITE_OTHER): Payer: Self-pay | Admitting: Neurology

## 2016-09-20 ENCOUNTER — Ambulatory Visit (INDEPENDENT_AMBULATORY_CARE_PROVIDER_SITE_OTHER): Payer: PPO | Admitting: Neurology

## 2016-09-20 DIAGNOSIS — M5412 Radiculopathy, cervical region: Secondary | ICD-10-CM

## 2016-09-20 DIAGNOSIS — G603 Idiopathic progressive neuropathy: Secondary | ICD-10-CM

## 2016-09-20 NOTE — Progress Notes (Signed)
Please refer to EMG and nerve conduction study procedure note. 

## 2016-09-20 NOTE — Procedures (Signed)
     HISTORY:  Claudia [name] is an 83 year old patient with a history of a peripheral neuropathy documented on EMG nerve conduction study in 2013. The patient comes in with onset of weakness involving the right arm, inability to elevate the arm since November 2017. She had some discomfort at onset lasting about 10 days, but no pain since that time. She is being evaluated for the right arm weakness.  NERVE CONDUCTION STUDIES:  Nerve conduction studies were performed on both upper extremities. The distal motor latencies for the median nerves were prolonged bilaterally with normal motor amplitudes for these nerves bilaterally. The distal motor latency for the ulnar nerves were prolonged bilaterally. The motor amplitudes for the left ulnar nerve was low, normal on the right. The studies of the radial nerves were unobtainable bilaterally. The sensory latencies for the median nerves were prolonged bilaterally, normal for the ulnar nerves bilaterally. The radial sensory latencies were within normal limits bilaterally. The F wave latencies for the left median nerve was normal, but was prolonged for the left ulnar nerve, normal for the right median nerve and slightly prolonged for the right ulnar nerve.  EMG STUDIES:  EMG study was performed on the right upper extremity:  The first dorsal interosseous muscle reveals 2 to 4 K units with full recruitment. No fibrillations or positive waves were noted. The abductor pollicis brevis muscle reveals 2 to 4 K units with full recruitment. No fibrillations or positive waves were noted. The extensor indicis proprius muscle reveals 1 to 3 K units with full recruitment. No fibrillations or positive waves were noted. The pronator teres muscle reveals 2 to 5 K units with decreased recruitment. No fibrillations or positive waves were noted. The biceps muscle reveals 1 to 2 K units with full recruitment. No fibrillations or positive waves were noted. The triceps muscle  reveals 2 to 6 K units with decreased recruitment. No fibrillations or positive waves were noted. The anterior deltoid muscle reveals 2 to 5 K units with decreased recruitment. No fibrillations or positive waves were noted. The supraspinatus muscle reveals 2 to 5 K units with decreased recruitment. No fibrillations or positive waves were noted. The cervical paraspinal muscles were tested at 2 levels. No abnormalities of insertional activity were seen at either level tested. There was poor relaxation.   IMPRESSION:  Nerve conduction studies done on both upper extremities shows evidence of bilateral mild carpal tunnel syndrome and bilateral mild distal dysfunction of the ulnar nerves bilaterally. The radial nerves were studied, but the motor evaluation was unobtainable. EMG evaluation of the right upper extremity shows chronic stable signs of denervation involving the C7 and C5 nerve roots. No active denervation was seen.  Jill Alexanders MD 09/20/2016 4:42 PM  [facility] [address] [place], [place] [address]  Phone [phone] Fax [phone]

## 2016-09-22 ENCOUNTER — Ambulatory Visit: Payer: PPO | Admitting: Family Medicine

## 2016-09-23 DIAGNOSIS — M25561 Pain in right knee: Secondary | ICD-10-CM | POA: Diagnosis not present

## 2016-09-23 DIAGNOSIS — M25562 Pain in left knee: Secondary | ICD-10-CM | POA: Diagnosis not present

## 2016-09-27 DIAGNOSIS — M25562 Pain in left knee: Secondary | ICD-10-CM | POA: Diagnosis not present

## 2016-09-27 DIAGNOSIS — Z96652 Presence of left artificial knee joint: Secondary | ICD-10-CM | POA: Diagnosis not present

## 2016-09-30 DIAGNOSIS — M25562 Pain in left knee: Secondary | ICD-10-CM | POA: Diagnosis not present

## 2016-09-30 DIAGNOSIS — Z96652 Presence of left artificial knee joint: Secondary | ICD-10-CM | POA: Diagnosis not present

## 2016-10-04 DIAGNOSIS — M25562 Pain in left knee: Secondary | ICD-10-CM | POA: Diagnosis not present

## 2016-10-04 DIAGNOSIS — Z96652 Presence of left artificial knee joint: Secondary | ICD-10-CM | POA: Diagnosis not present

## 2016-10-06 DIAGNOSIS — Z96652 Presence of left artificial knee joint: Secondary | ICD-10-CM | POA: Diagnosis not present

## 2016-10-06 DIAGNOSIS — M25562 Pain in left knee: Secondary | ICD-10-CM | POA: Diagnosis not present

## 2016-10-11 DIAGNOSIS — M25562 Pain in left knee: Secondary | ICD-10-CM | POA: Diagnosis not present

## 2016-10-11 DIAGNOSIS — Z96652 Presence of left artificial knee joint: Secondary | ICD-10-CM | POA: Diagnosis not present

## 2016-10-13 ENCOUNTER — Other Ambulatory Visit: Payer: Self-pay | Admitting: Internal Medicine

## 2016-10-13 DIAGNOSIS — I10 Essential (primary) hypertension: Secondary | ICD-10-CM

## 2016-10-14 DIAGNOSIS — Z96652 Presence of left artificial knee joint: Secondary | ICD-10-CM | POA: Diagnosis not present

## 2016-10-14 DIAGNOSIS — M25562 Pain in left knee: Secondary | ICD-10-CM | POA: Diagnosis not present

## 2016-10-18 DIAGNOSIS — M25562 Pain in left knee: Secondary | ICD-10-CM | POA: Diagnosis not present

## 2016-10-18 DIAGNOSIS — Z96652 Presence of left artificial knee joint: Secondary | ICD-10-CM | POA: Diagnosis not present

## 2016-10-21 DIAGNOSIS — M25562 Pain in left knee: Secondary | ICD-10-CM | POA: Diagnosis not present

## 2016-10-21 DIAGNOSIS — Z96652 Presence of left artificial knee joint: Secondary | ICD-10-CM | POA: Diagnosis not present

## 2016-10-25 DIAGNOSIS — Z96652 Presence of left artificial knee joint: Secondary | ICD-10-CM | POA: Diagnosis not present

## 2016-10-25 DIAGNOSIS — M25562 Pain in left knee: Secondary | ICD-10-CM | POA: Diagnosis not present

## 2016-10-28 DIAGNOSIS — M25562 Pain in left knee: Secondary | ICD-10-CM | POA: Diagnosis not present

## 2016-10-28 DIAGNOSIS — Z96652 Presence of left artificial knee joint: Secondary | ICD-10-CM | POA: Diagnosis not present

## 2016-11-01 DIAGNOSIS — Z96652 Presence of left artificial knee joint: Secondary | ICD-10-CM | POA: Diagnosis not present

## 2016-11-01 DIAGNOSIS — M25562 Pain in left knee: Secondary | ICD-10-CM | POA: Diagnosis not present

## 2016-11-04 DIAGNOSIS — M25562 Pain in left knee: Secondary | ICD-10-CM | POA: Diagnosis not present

## 2016-11-04 DIAGNOSIS — Z96652 Presence of left artificial knee joint: Secondary | ICD-10-CM | POA: Diagnosis not present

## 2016-11-09 DIAGNOSIS — M25562 Pain in left knee: Secondary | ICD-10-CM | POA: Diagnosis not present

## 2016-11-09 DIAGNOSIS — M1712 Unilateral primary osteoarthritis, left knee: Secondary | ICD-10-CM | POA: Diagnosis not present

## 2016-11-09 DIAGNOSIS — Z96652 Presence of left artificial knee joint: Secondary | ICD-10-CM | POA: Diagnosis not present

## 2016-11-12 DIAGNOSIS — Z96652 Presence of left artificial knee joint: Secondary | ICD-10-CM | POA: Diagnosis not present

## 2016-11-12 DIAGNOSIS — M25562 Pain in left knee: Secondary | ICD-10-CM | POA: Diagnosis not present

## 2017-01-06 ENCOUNTER — Encounter: Payer: Self-pay | Admitting: Internal Medicine

## 2017-01-06 ENCOUNTER — Ambulatory Visit (INDEPENDENT_AMBULATORY_CARE_PROVIDER_SITE_OTHER): Payer: PPO | Admitting: Internal Medicine

## 2017-01-06 ENCOUNTER — Other Ambulatory Visit (INDEPENDENT_AMBULATORY_CARE_PROVIDER_SITE_OTHER): Payer: PPO

## 2017-01-06 VITALS — BP 142/68 | HR 45 | Temp 97.7°F | Resp 16 | Ht 70.0 in | Wt 243.5 lb

## 2017-01-06 DIAGNOSIS — R001 Bradycardia, unspecified: Secondary | ICD-10-CM

## 2017-01-06 DIAGNOSIS — E032 Hypothyroidism due to medicaments and other exogenous substances: Secondary | ICD-10-CM

## 2017-01-06 DIAGNOSIS — I10 Essential (primary) hypertension: Secondary | ICD-10-CM | POA: Diagnosis not present

## 2017-01-06 LAB — TSH: TSH: 4.76 u[IU]/mL — ABNORMAL HIGH (ref 0.35–4.50)

## 2017-01-06 LAB — BASIC METABOLIC PANEL
BUN: 21 mg/dL (ref 6–23)
CALCIUM: 10 mg/dL (ref 8.4–10.5)
CO2: 27 mEq/L (ref 19–32)
CREATININE: 0.93 mg/dL (ref 0.40–1.20)
Chloride: 105 mEq/L (ref 96–112)
GFR: 74.44 mL/min (ref >60.00)
Glucose, Bld: 92 mg/dL (ref 70–99)
Potassium: 4 mEq/L (ref 3.5–5.1)
SODIUM: 139 meq/L (ref 135–145)

## 2017-01-06 MED ORDER — ZOSTER VAC RECOMB ADJUVANTED 50 MCG/0.5ML IM SUSR: 0.5000 mL | Freq: Once | INTRAMUSCULAR | 1 refills | Status: AC

## 2017-01-06 NOTE — Patient Instructions (Signed)
Hypothyroidism Hypothyroidism is a disorder of the thyroid. The thyroid is a large gland that is located in the lower front of the neck. The thyroid releases hormones that control how the body works. With hypothyroidism, the thyroid does not make enough of these hormones. What are the causes? Causes of hypothyroidism may include:  Viral infections.  Pregnancy.  Your own defense system (immune system) attacking your thyroid.  Certain medicines.  Birth defects.  Past radiation treatments to your head or neck.  Past treatment with radioactive iodine.  Past surgical removal of part or all of your thyroid.  Problems with the gland that is located in the center of your brain (pituitary).  What are the signs or symptoms? Signs and symptoms of hypothyroidism may include:  Feeling as though you have no energy (lethargy).  Inability to tolerate cold.  Weight gain that is not explained by a change in diet or exercise habits.  Dry skin.  Coarse hair.  Menstrual irregularity.  Slowing of thought processes.  Constipation.  Sadness or depression.  How is this diagnosed? Your health care provider may diagnose hypothyroidism with blood tests and ultrasound tests. How is this treated? Hypothyroidism is treated with medicine that replaces the hormones that your body does not make. After you begin treatment, it may take several weeks for symptoms to go away. Follow these instructions at home:  Take medicines only as directed by your health care provider.  If you start taking any new medicines, tell your health care provider.  Keep all follow-up visits as directed by your health care provider. This is important. As your condition improves, your dosage needs may change. You will need to have blood tests regularly so that your health care provider can watch your condition. Contact a health care provider if:  Your symptoms do not get better with treatment.  You are taking thyroid  replacement medicine and: ? You sweat excessively. ? You have tremors. ? You feel anxious. ? You lose weight rapidly. ? You cannot tolerate heat. ? You have emotional swings. ? You have diarrhea. ? You feel weak. Get help right away if:  You develop chest pain.  You develop an irregular heartbeat.  You develop a rapid heartbeat. This information is not intended to replace advice given to you by your health care provider. Make sure you discuss any questions you have with your health care provider. Document Released: 06/14/2005 Document Revised: 11/20/2015 Document Reviewed: 10/30/2013 [employer]vier Interactive Patient Education  2017 [employer]  

## 2017-01-06 NOTE — Progress Notes (Signed)
Subjective:  Patient ID: Claudia [name], female    DOB: April 08, 1934  Age: 83 y.o. MRN: [id number]  CC: Hypertension   HPI Claudia [name] presents for a f/up - She complains of chronic musculoskeletal pain and the frustration that she can't lose weight but offers no new or different complaints today.  Outpatient Medications Prior to Visit  Medication Sig Dispense Refill  . aspirin 81 MG tablet Take 81 mg by mouth daily.    . cetirizine (ZYRTEC) 10 MG tablet Take 1 tablet (10 mg total) by mouth daily. 90 tablet 1  . HYDROcodone-acetaminophen (NORCO/VICODIN) 5-325 MG tablet Take 1 tablet by mouth every 6 (six) hours as needed for moderate pain. 90 tablet 0  . ipratropium (ATROVENT) 0.03 % nasal spray Place 2 sprays into both nostrils every 12 (twelve) hours. (Patient taking differently: Place 2 sprays into both nostrils every 12 (twelve) hours as needed for rhinitis. ) 30 mL 12  . irbesartan-hydrochlorothiazide (AVALIDE) 300-12.5 MG tablet take 1 tablet by mouth once daily 90 tablet 1  . linaclotide (LINZESS) 290 MCG CAPS capsule Take 1 capsule (290 mcg total) by mouth daily before breakfast. 90 capsule 1  . vitamin C (ASCORBIC ACID) 500 MG tablet Take 500 mg by mouth daily.    . vitamin E 400 UNIT capsule Take 400 Units by mouth daily.     No facility-administered medications prior to visit.     ROS Review of Systems  Constitutional: Negative for activity change, appetite change, fatigue and unexpected weight change.  HENT: Negative.  Negative for sore throat and trouble swallowing.   Eyes: Negative.  Negative for visual disturbance.  Respiratory: Negative.  Negative for cough, chest tightness, shortness of breath and wheezing.   Cardiovascular: Negative for chest pain, palpitations and leg swelling.  Gastrointestinal: Positive for constipation. Negative for abdominal pain, diarrhea, nausea and vomiting.  Endocrine: Negative.  Negative for cold intolerance and heat intolerance.    Genitourinary: Negative.  Negative for difficulty urinating.  Musculoskeletal: Positive for arthralgias. Negative for back pain and myalgias.  Skin: Negative.   Allergic/Immunologic: Negative.   Neurological: Negative.  Negative for dizziness, weakness and headaches.  Hematological: Negative for adenopathy. Does not bruise/bleed easily.  Psychiatric/Behavioral: Negative.     Objective:  BP (!) 142/68 (BP Location: Left Arm, Patient Position: Sitting, Cuff Size: Normal)   Pulse (!) 45   Temp 97.7 F (36.5 C) (Oral)   Resp 16   Ht 5\' 10"  (1.778 m)   Wt 243 lb 8 oz (110.5 kg)   SpO2 99%   BMI 34.94 kg/m   BP Readings from Last 3 Encounters:  01/06/17 (!) 142/68  09/08/16 (!) 150/70  08/16/16 (!) 144/64    Wt Readings from Last 3 Encounters:  01/06/17 243 lb 8 oz (110.5 kg)  09/08/16 240 lb (108.9 kg)  08/16/16 244 lb (110.7 kg)    Physical Exam  Constitutional: She is oriented to person, place, and time. No distress.  HENT:  Mouth/Throat: Oropharynx is clear and moist. No oropharyngeal exudate.  Eyes: Conjunctivae are normal. Right eye exhibits no discharge. Left eye exhibits no discharge. No scleral icterus.  Neck: Normal range of motion. Neck supple. No JVD present. No thyromegaly present.  Cardiovascular: Regular rhythm and intact distal pulses.  Bradycardia present.  PMI is not displaced.  Exam reveals no gallop and no friction rub.   No murmur heard. Pulmonary/Chest: Effort normal and breath sounds normal. No respiratory distress. She has no wheezes. She has  no rales. She exhibits no tenderness.  Abdominal: Soft. Bowel sounds are normal. She exhibits no distension and no mass. There is no tenderness. There is no rebound and no guarding.  Musculoskeletal: Normal range of motion. She exhibits no edema, tenderness or deformity.  Lymphadenopathy:    She has no cervical adenopathy.  Neurological: She is alert and oriented to person, place, and time.  Skin: Skin is warm  and dry. No rash noted. She is not diaphoretic. No erythema. No pallor.  Vitals reviewed.   Lab Results  Component Value Date   WBC 5.4 05/06/2016   HGB 13.2 05/06/2016   HCT 40.3 05/06/2016   PLT 201 05/06/2016   GLUCOSE 92 01/06/2017   CHOL 181 09/08/2016   TRIG 67.0 09/08/2016   HDL 61.80 09/08/2016   LDLDIRECT 158.9 09/05/2012   LDLCALC 106 (H) 09/08/2016   ALT 15 01/01/2016   AST 19 01/01/2016   NA 139 01/06/2017   K 4.0 01/06/2017   CL 105 01/06/2017   CREATININE 0.93 01/06/2017   BUN 21 01/06/2017   CO2 27 01/06/2017   TSH 4.76 (H) 01/06/2017   HGBA1C 5.8 (H) 08/24/2015    Mr Shoulder Right Wo Contrast  Result Date: 05/20/2016 CLINICAL DATA:  Right shoulder pain with limited range of motion. EXAM: MRI OF THE RIGHT SHOULDER WITHOUT CONTRAST TECHNIQUE: Multiplanar, multisequence MR imaging of the shoulder was performed. No intravenous contrast was administered. COMPARISON:  None. FINDINGS: Rotator cuff: There is a small focal full-thickness tear of the distal supraspinous tendon measuring 5 mm in diameter. The rotator cuff is otherwise intact. Muscles: Slight atrophy of the supraspinous, infraspinatus and teres minor muscles. Biceps long head:  Properly located and intact. Acromioclavicular Joint: Moderate arthropathy of the acromioclavicular joint. Type 2 acromion. Glenohumeral Joint: No joint effusion. No chondral defect. Labrum:  Intact. Bones: Cystic degenerative changes of the greater and lesser tuberosities. Other: None. IMPRESSION: Small focal full-thickness tear of the distal supraspinous tendon. Electronically Signed   By: Lorriane Shire M.D.   On: 05/20/2016 10:58    Assessment & Plan:   Claudia [name] was seen today for hypertension.  Diagnoses and all orders for this visit:  Essential hypertension, benign- her blood pressure is well-controlled, electrolytes and renal function are normal. -     Basic metabolic panel; Future  Bradycardia- she is asymptomatic with  respect to this, she will let me know if she develops any related symptoms. -     TSH; Future  Hypothyroidism due to medication- her TSH is in the acceptable range, we'll continue the current dose of levothyroxine. -     TSH; Future  Other orders -     Zoster Vac Recomb Adjuvanted [facility]) injection; Inject 0.5 mLs into the muscle once.   I am having Claudia [name] start on Zoster Vac Recomb Adjuvanted. I am also having her maintain her aspirin, vitamin C, vitamin E, ipratropium, linaclotide, cetirizine, HYDROcodone-acetaminophen, and irbesartan-hydrochlorothiazide.  Meds ordered this encounter  Medications  . Zoster Vac Recomb Adjuvanted [facility]) injection    Sig: Inject 0.5 mLs into the muscle once.    Dispense:  1 each    Refill:  1     Follow-up: Return in about 6 months (around 07/09/2017).  [name], MD

## 2017-01-24 ENCOUNTER — Encounter: Payer: Self-pay | Admitting: Internal Medicine

## 2017-01-24 ENCOUNTER — Other Ambulatory Visit: Payer: Self-pay | Admitting: Internal Medicine

## 2017-01-24 ENCOUNTER — Telehealth: Payer: Self-pay | Admitting: Internal Medicine

## 2017-01-24 DIAGNOSIS — Z79891 Long term (current) use of opiate analgesic: Secondary | ICD-10-CM | POA: Diagnosis not present

## 2017-01-24 DIAGNOSIS — M47817 Spondylosis without myelopathy or radiculopathy, lumbosacral region: Secondary | ICD-10-CM

## 2017-01-24 DIAGNOSIS — M961 Postlaminectomy syndrome, not elsewhere classified: Secondary | ICD-10-CM

## 2017-01-24 DIAGNOSIS — M170 Bilateral primary osteoarthritis of knee: Secondary | ICD-10-CM

## 2017-01-24 MED ORDER — HYDROCODONE-ACETAMINOPHEN 5-325 MG PO TABS: 1.0000 | ORAL_TABLET | Freq: Four times a day (QID) | ORAL | 0 refills | Status: DC | PRN

## 2017-01-24 NOTE — Telephone Encounter (Signed)
Patient requesting 3 months worth of scripts for hydrocodone.  States Claudia [name] has done this earlier in the year for her.

## 2017-01-24 NOTE — Telephone Encounter (Signed)
Notified pt rx's ready for pick-up../lmb 

## 2017-01-24 NOTE — Telephone Encounter (Signed)
No UDS on file. Check [place] registry last filled 12/17/2016 @ [facility] pls advise...Johny Chess

## 2017-01-24 NOTE — Telephone Encounter (Signed)
RX's written   Please get a UDS

## 2017-02-07 ENCOUNTER — Ambulatory Visit: Payer: PPO

## 2017-04-04 ENCOUNTER — Other Ambulatory Visit: Payer: Self-pay

## 2017-04-04 ENCOUNTER — Emergency Department (HOSPITAL_COMMUNITY): Payer: PPO

## 2017-04-04 ENCOUNTER — Encounter (HOSPITAL_COMMUNITY): Payer: Self-pay | Admitting: Emergency Medicine

## 2017-04-04 ENCOUNTER — Emergency Department (HOSPITAL_COMMUNITY)
Admission: EM | Admit: 2017-04-04 | Discharge: 2017-04-04 | Disposition: A | Discharge status: Home / Self Care | Payer: PPO | Attending: Emergency Medicine | Admitting: Emergency Medicine

## 2017-04-04 DIAGNOSIS — D329 Benign neoplasm of meninges, unspecified: Secondary | ICD-10-CM | POA: Insufficient documentation

## 2017-04-04 DIAGNOSIS — I10 Essential (primary) hypertension: Secondary | ICD-10-CM | POA: Diagnosis not present

## 2017-04-04 DIAGNOSIS — R200 Anesthesia of skin: Secondary | ICD-10-CM | POA: Diagnosis not present

## 2017-04-04 DIAGNOSIS — E039 Hypothyroidism, unspecified: Secondary | ICD-10-CM | POA: Diagnosis not present

## 2017-04-04 DIAGNOSIS — Z96651 Presence of right artificial knee joint: Secondary | ICD-10-CM | POA: Diagnosis not present

## 2017-04-04 DIAGNOSIS — R42 Dizziness and giddiness: Secondary | ICD-10-CM

## 2017-04-04 DIAGNOSIS — D320 Benign neoplasm of cerebral meninges: Secondary | ICD-10-CM | POA: Diagnosis not present

## 2017-04-04 LAB — COMPREHENSIVE METABOLIC PANEL
ALBUMIN: 3.9 g/dL (ref 3.5–5.0)
ALT: 11 U/L — ABNORMAL LOW (ref 14–54)
ANION GAP: 7 (ref 5–15)
AST: 18 U/L (ref 15–41)
Alkaline Phosphatase: 85 U/L (ref 38–126)
BUN: 21 mg/dL — ABNORMAL HIGH (ref 6–20)
CALCIUM: 9.8 mg/dL (ref 8.9–10.3)
CO2: 24 mmol/L (ref 22–32)
Chloride: 106 mmol/L (ref 101–111)
Creatinine, Ser: 0.99 mg/dL (ref 0.44–1.00)
GFR calc non Af Amer: 52 mL/min — ABNORMAL LOW (ref >60)
GLUCOSE: 92 mg/dL (ref 65–99)
POTASSIUM: 4.1 mmol/L (ref 3.5–5.1)
SODIUM: 137 mmol/L (ref 135–145)
TOTAL PROTEIN: 6.8 g/dL (ref 6.5–8.1)
Total Bilirubin: 0.9 mg/dL (ref 0.3–1.2)

## 2017-04-04 LAB — I-STAT CHEM 8, ED
BUN: 23 mg/dL — AB (ref 6–20)
CALCIUM ION: 1.31 mmol/L (ref 1.15–1.40)
CHLORIDE: 105 mmol/L (ref 101–111)
Creatinine, Ser: 0.9 mg/dL (ref 0.44–1.00)
Glucose, Bld: 93 mg/dL (ref 65–99)
HEMATOCRIT: 39 % (ref 36.0–46.0)
Hemoglobin: 13.3 g/dL (ref 12.0–15.0)
Potassium: 4.1 mmol/L (ref 3.5–5.1)
SODIUM: 140 mmol/L (ref 135–145)
TCO2: 24 mmol/L (ref 22–32)

## 2017-04-04 LAB — I-STAT TROPONIN, ED: Troponin i, poc: 0 ng/mL (ref 0.00–0.08)

## 2017-04-04 LAB — CBC
HCT: 39.9 % (ref 36.0–46.0)
Hemoglobin: 12.7 g/dL (ref 12.0–15.0)
MCH: 27.5 pg (ref 26.0–34.0)
MCHC: 31.8 g/dL (ref 30.0–36.0)
MCV: 86.6 fL (ref 78.0–100.0)
PLATELETS: 203 10*3/uL (ref 150–400)
RBC: 4.61 MIL/uL (ref 3.87–5.11)
RDW: 13.8 % (ref 11.5–15.5)
WBC: 4.7 10*3/uL (ref 4.0–10.5)

## 2017-04-04 LAB — DIFFERENTIAL
Basophils Absolute: 0 10*3/uL (ref 0.0–0.1)
Basophils Relative: 1 %
EOS ABS: 0.3 10*3/uL (ref 0.0–0.7)
EOS PCT: 6 %
Lymphocytes Relative: 33 %
Lymphs Abs: 1.6 10*3/uL (ref 0.7–4.0)
MONO ABS: 0.3 10*3/uL (ref 0.1–1.0)
Monocytes Relative: 6 %
NEUTROS PCT: 54 %
Neutro Abs: 2.5 10*3/uL (ref 1.7–7.7)

## 2017-04-04 LAB — CBG MONITORING, ED: GLUCOSE-CAPILLARY: 100 mg/dL — AB (ref 65–99)

## 2017-04-04 LAB — APTT: aPTT: 31 seconds (ref 24–36)

## 2017-04-04 LAB — PROTIME-INR
INR: 1
PROTHROMBIN TIME: 13.1 s (ref 11.4–15.2)

## 2017-04-04 MED ORDER — SODIUM CHLORIDE 0.9 % IV BOLUS (SEPSIS)
500.0000 mL | Freq: Once | INTRAVENOUS | Status: AC
  Administered 2017-04-04: 500 mL via INTRAVENOUS

## 2017-04-04 NOTE — ED Triage Notes (Signed)
Pt reports dizziness x4 days, states dizziness is worse today, also endorses numbness to left side of face and left arm that began at 1130 today, speech clear, face symmetrical, grip strength equal bilaterally.

## 2017-04-04 NOTE — ED Notes (Signed)
Patient transported to MRI 

## 2017-04-04 NOTE — Discharge Instructions (Signed)
You were seen in the ED today with left finger numbness that has resolved along with episodic lightheadedness. Your MRI was negative for stroke but we did find a meningioma. These are typically benign but you will need to discuss further with your primary care physician who you should follow up with in the coming week.   Return to the ED with any worsening numbness, tingling, lightheadedness, difficulty breathing, or chest pain.

## 2017-04-04 NOTE — ED Notes (Signed)
Pt's CBG results were 100. Informed Chelsea - RN.

## 2017-04-04 NOTE — ED Notes (Signed)
Pt ambulatory independently to restroom with no complaints.

## 2017-04-04 NOTE — ED Notes (Signed)
Pt ambulated with steady gait to restroom with no difficulty.

## 2017-04-04 NOTE — ED Provider Notes (Signed)
Emergency Department Provider Note   I have reviewed the triage vital signs and the nursing notes.   HISTORY  Chief Complaint Dizziness and Numbness   HPI Claudia [name] is a 83 y.o. female with PMH of HTN, OA, HLD, and prior left knee replacement presents to the emergency department for evaluation of lightheadedness over the past 4 days with new onset numbness in the left hand. No symptoms in the left armor leg. Patient denies any vision changes, speech changes, difficulty swallowing, or obvious face weakness. The symptoms began at 11:30 AM today. Patient describes her lightheadedness as intermittent and worse when she's been standing for Emani Morad period of time. Not worse with sudden standing. No new medications. Denies any active bleeding. No chest pain, shortness of breath, heart palpitations. She has had constipation today and took a laxative which cause some loose stools but has not had ongoing diarrhea. She is eating and drinking well. She notes a Navika Hoopes history of low heart rate. No history of prior CVA or TIA. No vertigo symptoms.    Past Medical History:  Diagnosis Date  . Abnormality of gait   . Brachial neuritis or radiculitis NOS   . Degeneration of lumbar or lumbosacral intervertebral disc   . Essential hypertension, benign   . Gouty arthropathy   . Lumbago   . Obesity   . Osteoarthritis   . Pure hypercholesterolemia   . Unspecified hypothyroidism     Patient Active Problem List   Diagnosis Date Noted  . Acute nonseasonal allergic rhinitis due to pollen 08/16/2016  . Spinal stenosis in cervical region 05/12/2016  . Bradycardia 01/01/2016  . Therapeutic opioid-induced constipation (OIC) 01/01/2016  . Constipation, chronic 09/11/2013  . Lumbosacral spondylosis without myelopathy 04/09/2013  . Postlaminectomy syndrome, lumbar region 04/09/2013  . Routine general medical examination at a health care facility 02/01/2012  . Neuropathy, peripheral 08/04/2011  . Pure  hypercholesterolemia 08/04/2011  . Essential hypertension, benign 08/04/2011  . Hypothyroidism 08/04/2011  . DJD (degenerative joint disease) of knee 08/04/2011  . Gout 08/04/2011  . Visit for screening mammogram 08/04/2011    Past Surgical History:  Procedure Laterality Date  . ABDOMINAL HYSTERECTOMY  03/13/2010  . BACK SURGERY    . LUMBAR LAMINECTOMY  03/13/2010  . right knee replacement  03/13/2010    Current Outpatient Rx  . Order #: [id number] Class: Historical Med  . Order #: [id number] Class: Historical Med  . Order #: [id number] Class: Normal  . Order #: [id number] Class: Print  . Order #: [id number] Class: Normal  . Order #: [id number] Class: Normal  . Order #: [id number] Class: Print  . Order #: [id number] Class: Historical Med  . Order #: [id number] Class: Historical Med    Allergies Lipitor [atorvastatin] and Trileptal [oxcarbazepine]  Family History  Problem Relation Age of Onset  . Heart disease Father   . Hypertension Mother   . Alzheimer's disease Mother   . Diabetes Sister   . Cirrhosis Brother   . Cancer Neg Hx   . Kidney disease Neg Hx     Social History Social History  Substance Use Topics  . Smoking status: Never Smoker  . Smokeless tobacco: Never Used  . Alcohol use No     Comment: social    Review of Systems  Constitutional: No fever/chills. Positive intermittent lightheadedness over the last 3-4 days.  Eyes: No visual changes. ENT: No sore throat. Cardiovascular: Denies chest pain. Respiratory: Denies shortness of breath. Gastrointestinal: No abdominal pain.  No nausea, no vomiting.  No diarrhea.  No  constipation. Genitourinary: Negative for dysuria. Musculoskeletal: Negative for back pain. Skin: Negative for rash. Neurological: Negative for headaches, focal weakness. Positive numbness in the left fingers.   10-point ROS otherwise negative.  ____________________________________________   PHYSICAL EXAM:  VITAL SIGNS: ED Triage Vitals  Enc  Vitals Group     BP 04/04/17 1406 136/73     Pulse Rate 04/04/17 1406 (!) 55     Resp 04/04/17 1406 12     Temp 04/04/17 1406 (!) 97.5 F (36.4 C)     Temp Source 04/04/17 1406 Oral     SpO2 04/04/17 1406 97 %     Weight 04/04/17 1403 230 lb (104.3 kg)     Height 04/04/17 1403 5\' 10"  (1.778 m)     Pain Score 04/04/17 1403 3   Constitutional: Alert and oriented. Well appearing and in no acute distress. Eyes: Conjunctivae are normal. PERRL. EOMI. Head: Atraumatic. Nose: No congestion/rhinnorhea. Mouth/Throat: Mucous membranes are moist.  Oropharynx non-erythematous. Neck: No stridor.   Cardiovascular: Normal rate, regular rhythm. Good peripheral circulation. Grossly normal heart sounds.   Respiratory: Normal respiratory effort.  No retractions. Lungs CTAB. Gastrointestinal: Soft and nontender. No distention.  Musculoskeletal: No lower extremity tenderness nor edema. No gross deformities of extremities. Neurologic:  Normal speech and language. No gross focal neurologic deficits are appreciated. Normal grip strength bilaterally. No numbness appreciated in the upper or lower extremities. Normal CN exam 2-12.  Skin:  Skin is warm, dry and intact. No rash noted.  ____________________________________________   LABS (all labs ordered are listed, but only abnormal results are displayed)  Labs Reviewed  COMPREHENSIVE METABOLIC PANEL - Abnormal; Notable for the following:       Result Value   BUN 21 (*)    ALT 11 (*)    GFR calc non Af Amer 52 (*)    All other components within normal limits  CBG MONITORING, ED - Abnormal; Notable for the following:    Glucose-Capillary 100 (*)    All other components within normal limits  I-STAT CHEM 8, ED - Abnormal; Notable for the following:    BUN 23 (*)    All other components within normal limits  PROTIME-INR  APTT  CBC  DIFFERENTIAL  I-STAT TROPONIN, ED   ____________________________________________  EKG   EKG  Interpretation  Date/Time:  Monday April 04 2017 14:02:21 EDT Ventricular Rate:  55 PR Interval:  178 QRS Duration: 78 QT Interval:  444 QTC Calculation: 424 R Axis:   28 Text Interpretation:  Sinus bradycardia Otherwise normal ECG No STEMI.  Confirmed by Nanda Quinton [phone]) on 04/04/2017 5:33:59 PM       ____________________________________________  RADIOLOGY  Ct Head Wo Contrast  Result Date: 04/04/2017 CLINICAL DATA:  Dizziness for 4 days. EXAM: CT HEAD WITHOUT CONTRAST TECHNIQUE: Contiguous axial images were obtained from the base of the skull through the vertex without intravenous contrast. COMPARISON:  Head CT scan 12/07/2013. FINDINGS: Brain: Appears normal without hemorrhage, infarct, mass lesion, mass effect, midline shift or abnormal extra-axial fluid collection. No hydrocephalus or pneumocephalus. Vascular: Atherosclerosis noted. Skull: Intact. Sinuses/Orbits: Negative. Other: None. IMPRESSION: No acute abnormality. Atherosclerosis. Electronically Signed   By: Inge Rise M.D.   On: 04/04/2017 15:55   Mr Brain Wo Contrast  Result Date: 04/04/2017 CLINICAL DATA:  Initial evaluation for focal neuro deficits. EXAM: MRI HEAD WITHOUT CONTRAST TECHNIQUE: Multiplanar, multiecho pulse sequences of the brain and surrounding structures were obtained without intravenous contrast. COMPARISON:  Prior CT from 04/04/2017. FINDINGS:  Brain: Diffuse prominence of the CSF containing spaces compatible with generalized cerebral atrophy. Mild chronic microvascular ischemic changes present within the periventricular white matter. No abnormal foci of restricted diffusion to suggest acute or subacute ischemia. Gray-white matter differentiation maintained. No encephalomalacia to suggest chronic infarction. No susceptibility artifact to suggest acute or chronic intracranial hemorrhage. 16 mm meningioma overlies the right frontal convexity without significant mass effect (series 7, image 21). No other  mass lesion. No midline shift or mass effect. No hydrocephalus. No extra-axial fluid collection. Major dural sinuses are grossly patent. Pituitary gland suprasellar region normal. Midline structures intact and within normal limits. Vascular: Major intracranial vascular flow voids are maintained. Skull and upper cervical spine: Craniocervical junction within normal limits. Degenerative spondylolysis noted at C3-4 with resultant mild spinal stenosis. Remainder the visualized upper cervical spine otherwise unremarkable. Bone marrow signal intensity within normal limits. No scalp soft tissue abnormality. Sinuses/Orbits: Globes and orbital soft tissues within normal limits. Paranasal sinuses are largely clear. No mastoid effusion. Inner ear structures normal. Other: None. IMPRESSION: 1. No acute intracranial infarct or other abnormality identified. 2. Mild for age chronic microvascular ischemic disease. 3. 16 mm meningioma overlying the right frontal convexity without associated mass effect. Electronically Signed   By: Jeannine Boga M.D.   On: 04/04/2017 20:19    ____________________________________________   PROCEDURES  Procedure(s) performed:   Procedures  None ____________________________________________   INITIAL IMPRESSION / ASSESSMENT AND PLAN / ED COURSE  Pertinent labs & imaging results that were available during my care of the patient were reviewed by me and considered in my medical decision making (see chart for details).  Patient presents to the emergency department for evaluation of intermittent lightheadedness. Denies vertigo. No cerebellar ataxia on exam. Low suspicion that lightheadedness is neurological in origin. Suspect mild dehydration. Patient does have bradycardia which is present in review of past encounters (PCP appt from 12/2016). Patient then had new onset left hand/finger numbness and tingling. No findings on my exam. No weakness in the upper or lower extremities.  Normal cranial nerve exam. Given the patient's age and multiple risk factors I have some concern for subacute stroke. Last normal 11:30 AM. VAN negative. Plan for MRI and IVF.   MRI negative. Lightheadedness improved with IVF. Normal orthostatic vital signs. Plan for outpatient PCP and Neurology follow up.   At this time, I do not feel there is any life-threatening condition present. I have reviewed and discussed all results (EKG, imaging, lab, urine as appropriate), exam findings with patient. I have reviewed nursing notes and appropriate previous records.  I feel the patient is safe to be discharged home without further emergent workup. Discussed usual and customary return precautions. Patient and family (if present) verbalize understanding and are comfortable with this plan.  Patient will follow-up with their primary care provider. If they do not have a primary care provider, information for follow-up has been provided to them. All questions have been answered.  ____________________________________________  FINAL CLINICAL IMPRESSION(S) / ED DIAGNOSES  Final diagnoses:  Numbness  Episodic lightheadedness  Meningioma ([place])     MEDICATIONS GIVEN DURING THIS VISIT:  Medications  sodium chloride 0.9 % bolus 500 mL (0 mLs Intravenous Stopped 04/04/17 1857)     NEW OUTPATIENT MEDICATIONS STARTED DURING THIS VISIT:  None   Note:  This document was prepared using Dragon voice recognition software and may include unintentional dictation errors.  Nanda Quinton, MD Emergency Medicine    Torey Regan, [facility], MD 04/04/17 [phone]

## 2017-04-04 NOTE — ED Notes (Signed)
Dr Ralene Bathe made aware of pts onset of dizzines x4 days ago, and numbness at 32 today w/o any neuro deficits, md advised not to call code stroke on pt at this time.

## 2017-04-04 NOTE — ED Notes (Signed)
Pt verbalizes understanding of d/c instructions. Pt taken out to lobby in wheelchair with all belongings.   

## 2017-04-05 ENCOUNTER — Ambulatory Visit: Payer: PPO | Admitting: Nurse Practitioner

## 2017-04-12 ENCOUNTER — Ambulatory Visit: Payer: PPO

## 2017-04-19 ENCOUNTER — Encounter: Payer: Self-pay | Admitting: Internal Medicine

## 2017-04-19 ENCOUNTER — Ambulatory Visit (INDEPENDENT_AMBULATORY_CARE_PROVIDER_SITE_OTHER): Payer: PPO | Admitting: Internal Medicine

## 2017-04-19 VITALS — BP 160/80 | HR 48 | Temp 97.7°F | Ht 70.0 in | Wt 246.0 lb

## 2017-04-19 DIAGNOSIS — M47817 Spondylosis without myelopathy or radiculopathy, lumbosacral region: Secondary | ICD-10-CM

## 2017-04-19 DIAGNOSIS — Z23 Encounter for immunization: Secondary | ICD-10-CM

## 2017-04-19 DIAGNOSIS — M961 Postlaminectomy syndrome, not elsewhere classified: Secondary | ICD-10-CM | POA: Diagnosis not present

## 2017-04-19 DIAGNOSIS — R001 Bradycardia, unspecified: Secondary | ICD-10-CM

## 2017-04-19 DIAGNOSIS — M170 Bilateral primary osteoarthritis of knee: Secondary | ICD-10-CM

## 2017-04-19 DIAGNOSIS — K5909 Other constipation: Secondary | ICD-10-CM | POA: Diagnosis not present

## 2017-04-19 MED ORDER — HYDROCODONE-ACETAMINOPHEN 5-325 MG PO TABS: 1.0000 | ORAL_TABLET | Freq: Four times a day (QID) | ORAL | 0 refills | Status: DC | PRN

## 2017-04-19 MED ORDER — LINACLOTIDE 290 MCG PO CAPS: 290.0000 ug | ORAL_CAPSULE | Freq: Every day | ORAL | 1 refills | Status: DC

## 2017-04-19 NOTE — Patient Instructions (Signed)

## 2017-04-19 NOTE — Progress Notes (Signed)
Subjective:  Patient ID: Claudia [name], female    DOB: 1934/06/09  Age: 83 y.o. MRN: [id number]  CC: Hypertension   HPI Claudia [name] presents for f/up -she was seen in the ED a few weeks ago for near syncopal episode.  They treated her with IV fluids and she felt like she improved.  She continues to complain of dizzy spells but has had no more episodes of presyncope or syncope.  She denies palpitations, chest pain, edema, or shortness of breath.  Outpatient Medications Prior to Visit  Medication Sig Dispense Refill  . aspirin 81 MG tablet Take 81 mg by mouth daily.    . cetirizine (ZYRTEC) 10 MG tablet Take 1 tablet (10 mg total) by mouth daily. 90 tablet 1  . ipratropium (ATROVENT) 0.03 % nasal spray Place 2 sprays into both nostrils every 12 (twelve) hours. (Patient taking differently: Place 2 sprays into both nostrils every 12 (twelve) hours as needed for rhinitis. ) 30 mL 12  . irbesartan-hydrochlorothiazide (AVALIDE) 300-12.5 MG tablet take 1 tablet by mouth once daily 90 tablet 1  . vitamin C (ASCORBIC ACID) 500 MG tablet Take 500 mg by mouth daily.    . vitamin E 400 UNIT capsule Take 400 Units by mouth daily.    . AMITIZA 24 MCG capsule Take 24 mcg by mouth daily.  0  . HYDROcodone-acetaminophen (NORCO/VICODIN) 5-325 MG tablet Take 1 tablet by mouth every 6 (six) hours as needed for moderate pain. 90 tablet 0  . linaclotide (LINZESS) 290 MCG CAPS capsule Take 1 capsule (290 mcg total) by mouth daily before breakfast. 90 capsule 1   No facility-administered medications prior to visit.     ROS Review of Systems  Constitutional: Negative.  Negative for diaphoresis and fatigue.  HENT: Negative.   Respiratory: Negative.  Negative for chest tightness, shortness of breath, wheezing and stridor.   Cardiovascular: Negative for chest pain, palpitations and leg swelling.  Gastrointestinal: Positive for constipation. Negative for abdominal pain, diarrhea, nausea and vomiting.    Endocrine: Negative.   Genitourinary: Negative.  Negative for difficulty urinating.  Musculoskeletal: Positive for arthralgias and back pain. Negative for myalgias and neck pain.  Skin: Negative.  Negative for color change and rash.  Allergic/Immunologic: Negative.   Neurological: Positive for dizziness and light-headedness. Negative for weakness.  Hematological: Negative for adenopathy. Does not bruise/bleed easily.  Psychiatric/Behavioral: Negative.     Objective:  BP (!) 160/80 (BP Location: Left Arm, Patient Position: Sitting, Cuff Size: Normal)   Pulse (!) 48   Temp 97.7 F (36.5 C) (Oral)   Ht 5\' 10"  (1.778 m)   Wt 246 lb (111.6 kg)   SpO2 95%   BMI 35.30 kg/m   BP Readings from Last 3 Encounters:  04/19/17 (!) 160/80  04/04/17 (!) 167/52  01/06/17 (!) 142/68    Wt Readings from Last 3 Encounters:  04/19/17 246 lb (111.6 kg)  04/04/17 230 lb (104.3 kg)  01/06/17 243 lb 8 oz (110.5 kg)    Physical Exam  Constitutional: She is oriented to person, place, and time. No distress.  HENT:  Mouth/Throat: Oropharynx is clear and moist. No oropharyngeal exudate.  Eyes: Conjunctivae are normal. Right eye exhibits no discharge. Left eye exhibits no discharge. No scleral icterus.  Neck: Normal range of motion. Neck supple. No tracheal deviation present. No thyromegaly present.  Cardiovascular: Normal rate, regular rhythm and intact distal pulses.  Exam reveals no gallop and no friction rub.   No murmur  heard. Pulmonary/Chest: Effort normal and breath sounds normal. No respiratory distress. She has no wheezes. She has no rales. She exhibits no tenderness.  Abdominal: Soft. Bowel sounds are normal. She exhibits no distension and no mass. There is no tenderness. There is no rebound and no guarding.  Musculoskeletal: Normal range of motion. She exhibits no edema, tenderness or deformity.  Lymphadenopathy:    She has no cervical adenopathy.  Neurological: She is alert and oriented  to person, place, and time.  Skin: Skin is warm and dry. No rash noted. She is not diaphoretic. No erythema. No pallor.  Vitals reviewed.   Lab Results  Component Value Date   WBC 4.7 04/04/2017   HGB 13.3 04/04/2017   HCT 39.0 04/04/2017   PLT 203 04/04/2017   GLUCOSE 93 04/04/2017   CHOL 181 09/08/2016   TRIG 67.0 09/08/2016   HDL 61.80 09/08/2016   LDLDIRECT 158.9 09/05/2012   LDLCALC 106 (H) 09/08/2016   ALT 11 (L) 04/04/2017   AST 18 04/04/2017   NA 140 04/04/2017   K 4.1 04/04/2017   CL 105 04/04/2017   CREATININE 0.90 04/04/2017   BUN 23 (H) 04/04/2017   CO2 24 04/04/2017   TSH 4.76 (H) 01/06/2017   INR 1.00 04/04/2017   HGBA1C 5.8 (H) 08/24/2015    Ct Head Wo Contrast  Result Date: 04/04/2017 CLINICAL DATA:  Dizziness for 4 days. EXAM: CT HEAD WITHOUT CONTRAST TECHNIQUE: Contiguous axial images were obtained from the base of the skull through the vertex without intravenous contrast. COMPARISON:  Head CT scan 12/07/2013. FINDINGS: Brain: Appears normal without hemorrhage, infarct, mass lesion, mass effect, midline shift or abnormal extra-axial fluid collection. No hydrocephalus or pneumocephalus. Vascular: Atherosclerosis noted. Skull: Intact. Sinuses/Orbits: Negative. Other: None. IMPRESSION: No acute abnormality. Atherosclerosis. Electronically Signed   By: Inge Rise M.D.   On: 04/04/2017 15:55   Mr Brain Wo Contrast  Result Date: 04/04/2017 CLINICAL DATA:  Initial evaluation for focal neuro deficits. EXAM: MRI HEAD WITHOUT CONTRAST TECHNIQUE: Multiplanar, multiecho pulse sequences of the brain and surrounding structures were obtained without intravenous contrast. COMPARISON:  Prior CT from 04/04/2017. FINDINGS: Brain: Diffuse prominence of the CSF containing spaces compatible with generalized cerebral atrophy. Mild chronic microvascular ischemic changes present within the periventricular white matter. No abnormal foci of restricted diffusion to suggest acute or  subacute ischemia. Gray-white matter differentiation maintained. No encephalomalacia to suggest chronic infarction. No susceptibility artifact to suggest acute or chronic intracranial hemorrhage. 16 mm meningioma overlies the right frontal convexity without significant mass effect (series 7, image 21). No other mass lesion. No midline shift or mass effect. No hydrocephalus. No extra-axial fluid collection. Major dural sinuses are grossly patent. Pituitary gland suprasellar region normal. Midline structures intact and within normal limits. Vascular: Major intracranial vascular flow voids are maintained. Skull and upper cervical spine: Craniocervical junction within normal limits. Degenerative spondylolysis noted at C3-4 with resultant mild spinal stenosis. Remainder the visualized upper cervical spine otherwise unremarkable. Bone marrow signal intensity within normal limits. No scalp soft tissue abnormality. Sinuses/Orbits: Globes and orbital soft tissues within normal limits. Paranasal sinuses are largely clear. No mastoid effusion. Inner ear structures normal. Other: None. IMPRESSION: 1. No acute intracranial infarct or other abnormality identified. 2. Mild for age chronic microvascular ischemic disease. 3. 16 mm meningioma overlying the right frontal convexity without associated mass effect. Electronically Signed   By: Jeannine Boga M.D.   On: 04/04/2017 20:19    Assessment & Plan:   Aella was  seen today for hypertension.  Diagnoses and all orders for this visit:  Bradycardia-I am concerned she is experiencing symptomatic bradycardia.  I have asked her to see cardiology to see if this needs to be treated. -     Ambulatory referral to Cardiology  Constipation, chronic-she is getting adequate symptom relief with Linzess.  Will continue. -     linaclotide (LINZESS) 290 MCG CAPS capsule; Take 1 capsule (290 mcg total) by mouth daily before breakfast.  Primary osteoarthritis of both knees -      Discontinue: HYDROcodone-acetaminophen (NORCO/VICODIN) 5-325 MG tablet; Take 1 tablet by mouth every 6 (six) hours as needed for moderate pain. -     Discontinue: HYDROcodone-acetaminophen (NORCO/VICODIN) 5-325 MG tablet; Take 1 tablet by mouth every 6 (six) hours as needed for moderate pain. -     Discontinue: HYDROcodone-acetaminophen (NORCO/VICODIN) 5-325 MG tablet; Take 1 tablet by mouth every 6 (six) hours as needed for moderate pain. -     HYDROcodone-acetaminophen (NORCO/VICODIN) 5-325 MG tablet; Take 1 tablet by mouth every 6 (six) hours as needed for moderate pain.  Postlaminectomy syndrome, lumbar region -     Discontinue: HYDROcodone-acetaminophen (NORCO/VICODIN) 5-325 MG tablet; Take 1 tablet by mouth every 6 (six) hours as needed for moderate pain. -     Discontinue: HYDROcodone-acetaminophen (NORCO/VICODIN) 5-325 MG tablet; Take 1 tablet by mouth every 6 (six) hours as needed for moderate pain. -     Discontinue: HYDROcodone-acetaminophen (NORCO/VICODIN) 5-325 MG tablet; Take 1 tablet by mouth every 6 (six) hours as needed for moderate pain. -     HYDROcodone-acetaminophen (NORCO/VICODIN) 5-325 MG tablet; Take 1 tablet by mouth every 6 (six) hours as needed for moderate pain.  Lumbosacral spondylosis without myelopathy -     Discontinue: HYDROcodone-acetaminophen (NORCO/VICODIN) 5-325 MG tablet; Take 1 tablet by mouth every 6 (six) hours as needed for moderate pain. -     Discontinue: HYDROcodone-acetaminophen (NORCO/VICODIN) 5-325 MG tablet; Take 1 tablet by mouth every 6 (six) hours as needed for moderate pain. -     Discontinue: HYDROcodone-acetaminophen (NORCO/VICODIN) 5-325 MG tablet; Take 1 tablet by mouth every 6 (six) hours as needed for moderate pain. -     HYDROcodone-acetaminophen (NORCO/VICODIN) 5-325 MG tablet; Take 1 tablet by mouth every 6 (six) hours as needed for moderate pain.  Need for influenza vaccination -     Flu vaccine HIGH DOSE PF (Fluzone High dose)   I  have discontinued Ms. Tabora's AMITIZA. I am also having her maintain her aspirin, vitamin C, vitamin E, ipratropium, cetirizine, irbesartan-hydrochlorothiazide, linaclotide, and HYDROcodone-acetaminophen.  Meds ordered this encounter  Medications  . linaclotide (LINZESS) 290 MCG CAPS capsule    Sig: Take 1 capsule (290 mcg total) by mouth daily before breakfast.    Dispense:  90 capsule    Refill:  1  . DISCONTD: HYDROcodone-acetaminophen (NORCO/VICODIN) 5-325 MG tablet    Sig: Take 1 tablet by mouth every 6 (six) hours as needed for moderate pain.    Dispense:  90 tablet    Refill:  0    Fill on or after 04/19/17  . DISCONTD: HYDROcodone-acetaminophen (NORCO/VICODIN) 5-325 MG tablet    Sig: Take 1 tablet by mouth every 6 (six) hours as needed for moderate pain.    Dispense:  90 tablet    Refill:  0    Fill on or after 05/20/17  . DISCONTD: HYDROcodone-acetaminophen (NORCO/VICODIN) 5-325 MG tablet    Sig: Take 1 tablet by mouth every 6 (six) hours as needed  for moderate pain.    Dispense:  90 tablet    Refill:  0    Fill on or after 06/19/17  . HYDROcodone-acetaminophen (NORCO/VICODIN) 5-325 MG tablet    Sig: Take 1 tablet by mouth every 6 (six) hours as needed for moderate pain.    Dispense:  90 tablet    Refill:  0    Fill on or after 07/20/17     Follow-up: Return in about 4 months (around 08/20/2017).  [name], MD

## 2017-05-05 ENCOUNTER — Telehealth: Payer: Self-pay | Admitting: Internal Medicine

## 2017-05-05 DIAGNOSIS — I10 Essential (primary) hypertension: Secondary | ICD-10-CM

## 2017-05-05 MED ORDER — IRBESARTAN-HYDROCHLOROTHIAZIDE 300-12.5 MG PO TABS: 1.0000 | ORAL_TABLET | Freq: Every day | ORAL | 1 refills | Status: DC

## 2017-05-05 NOTE — Telephone Encounter (Signed)
erx sent

## 2017-05-05 NOTE — Telephone Encounter (Signed)
Patient is requesting irbesartan to be sent to [facility] on [name].  Patient states she is out and has been waiting on pharmacy to contact to refill.

## 2017-06-30 ENCOUNTER — Ambulatory Visit: Payer: PPO | Admitting: Cardiovascular Disease

## 2017-07-26 ENCOUNTER — Telehealth: Payer: Self-pay | Admitting: Internal Medicine

## 2017-07-26 NOTE — Telephone Encounter (Signed)
Copied from [place] [id number]. Topic: Quick Communication - See Telephone Encounter >> Jul 26, 2017  4:40 PM Percell Belt A wrote: CRM for notification. See Telephone encounter for:  pt called in and said she got a letter that her irbesartan-hydrochlorothiazide (AVALIDE) 300-12.5 MG tablet [[id number]] has been recalled.  She would like to know what needs to be done or changed to?    07/26/17.

## 2017-07-27 ENCOUNTER — Other Ambulatory Visit: Payer: Self-pay | Admitting: Internal Medicine

## 2017-07-27 DIAGNOSIS — I10 Essential (primary) hypertension: Secondary | ICD-10-CM

## 2017-07-27 MED ORDER — TELMISARTAN-HCTZ 80-12.5 MG PO TABS: 1.0000 | ORAL_TABLET | Freq: Every day | ORAL | 1 refills | Status: DC

## 2017-08-23 ENCOUNTER — Ambulatory Visit (INDEPENDENT_AMBULATORY_CARE_PROVIDER_SITE_OTHER): Payer: PPO | Admitting: Internal Medicine

## 2017-08-23 ENCOUNTER — Encounter: Payer: Self-pay | Admitting: Internal Medicine

## 2017-08-23 ENCOUNTER — Other Ambulatory Visit (INDEPENDENT_AMBULATORY_CARE_PROVIDER_SITE_OTHER): Payer: PPO

## 2017-08-23 VITALS — BP 140/72 | HR 49 | Temp 97.5°F | Ht 70.0 in | Wt 234.0 lb

## 2017-08-23 DIAGNOSIS — Z23 Encounter for immunization: Secondary | ICD-10-CM

## 2017-08-23 DIAGNOSIS — M961 Postlaminectomy syndrome, not elsewhere classified: Secondary | ICD-10-CM

## 2017-08-23 DIAGNOSIS — H25013 Cortical age-related cataract, bilateral: Secondary | ICD-10-CM

## 2017-08-23 DIAGNOSIS — I10 Essential (primary) hypertension: Secondary | ICD-10-CM | POA: Diagnosis not present

## 2017-08-23 DIAGNOSIS — E039 Hypothyroidism, unspecified: Secondary | ICD-10-CM

## 2017-08-23 DIAGNOSIS — M170 Bilateral primary osteoarthritis of knee: Secondary | ICD-10-CM

## 2017-08-23 DIAGNOSIS — M47817 Spondylosis without myelopathy or radiculopathy, lumbosacral region: Secondary | ICD-10-CM | POA: Diagnosis not present

## 2017-08-23 LAB — TSH: TSH: 5.71 u[IU]/mL — ABNORMAL HIGH (ref 0.35–4.50)

## 2017-08-23 LAB — BASIC METABOLIC PANEL
BUN: 23 mg/dL (ref 6–23)
CALCIUM: 10 mg/dL (ref 8.4–10.5)
CO2: 28 meq/L (ref 19–32)
CREATININE: 1 mg/dL (ref 0.40–1.20)
Chloride: 105 mEq/L (ref 96–112)
GFR: 68.35 mL/min (ref >60.00)
Glucose, Bld: 91 mg/dL (ref 70–99)
Potassium: 4.1 mEq/L (ref 3.5–5.1)
Sodium: 140 mEq/L (ref 135–145)

## 2017-08-23 MED ORDER — HYDROCODONE-ACETAMINOPHEN 5-325 MG PO TABS: 1.0000 | ORAL_TABLET | Freq: Four times a day (QID) | ORAL | 0 refills | Status: DC | PRN

## 2017-08-23 NOTE — Patient Instructions (Signed)

## 2017-08-23 NOTE — Progress Notes (Signed)
Subjective:  Patient ID: Claudia [name], female    DOB: 1934/04/15  Age: 84 y.o. MRN: [id number]  CC: Hypertension; Hypothyroidism; and Osteoarthritis   HPI Claudia [name] presents for f/up -she has a history of cataracts and has not had an eye exam in several years.  She has started to develop some difficulty with her visual acuity, more on the right than the left, and she wants a referral to ophthalmology.  Her level of joint pain and low back pain is unchanged.  She is getting adequate symptom relief with hydrocodone.  She tells me her blood pressure has been well controlled and she has had no recent episodes of DOE, CP, palpitations, edema, or fatigue.  Outpatient Medications Prior to Visit  Medication Sig Dispense Refill  . aspirin 81 MG tablet Take 81 mg by mouth daily.    . cetirizine (ZYRTEC) 10 MG tablet Take 1 tablet (10 mg total) by mouth daily. 90 tablet 1  . ipratropium (ATROVENT) 0.03 % nasal spray Place 2 sprays into both nostrils every 12 (twelve) hours. (Patient taking differently: Place 2 sprays into both nostrils every 12 (twelve) hours as needed for rhinitis. ) 30 mL 12  . linaclotide (LINZESS) 290 MCG CAPS capsule Take 1 capsule (290 mcg total) by mouth daily before breakfast. 90 capsule 1  . telmisartan-hydrochlorothiazide (MICARDIS HCT) 80-12.5 MG tablet Take 1 tablet by mouth daily. 90 tablet 1  . vitamin C (ASCORBIC ACID) 500 MG tablet Take 500 mg by mouth daily.    . vitamin E 400 UNIT capsule Take 400 Units by mouth daily.    [name] HYDROcodone-acetaminophen (NORCO/VICODIN) 5-325 MG tablet Take 1 tablet by mouth every 6 (six) hours as needed for moderate pain. 90 tablet 0   No facility-administered medications prior to visit.     ROS Review of Systems  Constitutional: Negative for appetite change, diaphoresis, fatigue and unexpected weight change.  HENT: Negative.  Negative for trouble swallowing.   Eyes: Positive for visual disturbance. Negative for  photophobia, pain and redness.  Respiratory: Negative for cough, chest tightness, shortness of breath and wheezing.   Cardiovascular: Negative for chest pain, palpitations and leg swelling.  Gastrointestinal: Negative for abdominal pain, constipation, diarrhea, nausea and vomiting.  Musculoskeletal: Positive for arthralgias and back pain. Negative for myalgias and neck pain.  Skin: Negative.  Negative for color change and rash.  Psychiatric/Behavioral: Negative.     Objective:  BP 140/72 (BP Location: Left Arm, Patient Position: Sitting, Cuff Size: Normal)   Pulse (!) 49   Temp (!) 97.5 F (36.4 C) (Oral)   Ht 5\' 10"  (1.778 m)   Wt 234 lb (106.1 kg)   SpO2 99%   BMI 33.58 kg/m   BP Readings from Last 3 Encounters:  08/23/17 140/72  04/19/17 (!) 160/80  04/04/17 (!) 167/52    Wt Readings from Last 3 Encounters:  08/23/17 234 lb (106.1 kg)  04/19/17 246 lb (111.6 kg)  04/04/17 230 lb (104.3 kg)    Physical Exam  Constitutional: She is oriented to person, place, and time. No distress.  HENT:  Mouth/Throat: Oropharynx is clear and moist. No oropharyngeal exudate.  Eyes: Conjunctivae are normal. Left eye exhibits no discharge. No scleral icterus.  Neck: Normal range of motion. Neck supple. No JVD present. No thyromegaly present.  Cardiovascular: Normal rate, regular rhythm and normal heart sounds. Exam reveals no gallop and no friction rub.  No murmur heard. Pulmonary/Chest: Effort normal and breath sounds normal. No respiratory  distress. She has no wheezes. She has no rales.  Abdominal: Soft. Bowel sounds are normal. She exhibits no distension and no mass. There is no tenderness. There is no guarding.  Musculoskeletal: Normal range of motion. She exhibits no edema, tenderness or deformity.  Lymphadenopathy:    She has no cervical adenopathy.  Neurological: She is alert and oriented to person, place, and time.  Skin: Skin is warm and dry. No rash noted. She is not  diaphoretic. No erythema. No pallor.  Vitals reviewed.   Lab Results  Component Value Date   WBC 4.7 04/04/2017   HGB 13.3 04/04/2017   HCT 39.0 04/04/2017   PLT 203 04/04/2017   GLUCOSE 91 08/23/2017   CHOL 181 09/08/2016   TRIG 67.0 09/08/2016   HDL 61.80 09/08/2016   LDLDIRECT 158.9 09/05/2012   LDLCALC 106 (H) 09/08/2016   ALT 11 (L) 04/04/2017   AST 18 04/04/2017   NA 140 08/23/2017   K 4.1 08/23/2017   CL 105 08/23/2017   CREATININE 1.00 08/23/2017   BUN 23 08/23/2017   CO2 28 08/23/2017   TSH 5.71 (H) 08/23/2017   INR 1.00 04/04/2017   HGBA1C 5.8 (H) 08/24/2015    Ct Head Wo Contrast  Result Date: 04/04/2017 CLINICAL DATA:  Dizziness for 4 days. EXAM: CT HEAD WITHOUT CONTRAST TECHNIQUE: Contiguous axial images were obtained from the base of the skull through the vertex without intravenous contrast. COMPARISON:  Head CT scan 12/07/2013. FINDINGS: Brain: Appears normal without hemorrhage, infarct, mass lesion, mass effect, midline shift or abnormal extra-axial fluid collection. No hydrocephalus or pneumocephalus. Vascular: Atherosclerosis noted. Skull: Intact. Sinuses/Orbits: Negative. Other: None. IMPRESSION: No acute abnormality. Atherosclerosis. Electronically Signed   By: Inge Rise M.D.   On: 04/04/2017 15:55   Mr Brain Wo Contrast  Result Date: 04/04/2017 CLINICAL DATA:  Initial evaluation for focal neuro deficits. EXAM: MRI HEAD WITHOUT CONTRAST TECHNIQUE: Multiplanar, multiecho pulse sequences of the brain and surrounding structures were obtained without intravenous contrast. COMPARISON:  Prior CT from 04/04/2017. FINDINGS: Brain: Diffuse prominence of the CSF containing spaces compatible with generalized cerebral atrophy. Mild chronic microvascular ischemic changes present within the periventricular white matter. No abnormal foci of restricted diffusion to suggest acute or subacute ischemia. Gray-white matter differentiation maintained. No encephalomalacia to  suggest chronic infarction. No susceptibility artifact to suggest acute or chronic intracranial hemorrhage. 16 mm meningioma overlies the right frontal convexity without significant mass effect (series 7, image 21). No other mass lesion. No midline shift or mass effect. No hydrocephalus. No extra-axial fluid collection. Major dural sinuses are grossly patent. Pituitary gland suprasellar region normal. Midline structures intact and within normal limits. Vascular: Major intracranial vascular flow voids are maintained. Skull and upper cervical spine: Craniocervical junction within normal limits. Degenerative spondylolysis noted at C3-4 with resultant mild spinal stenosis. Remainder the visualized upper cervical spine otherwise unremarkable. Bone marrow signal intensity within normal limits. No scalp soft tissue abnormality. Sinuses/Orbits: Globes and orbital soft tissues within normal limits. Paranasal sinuses are largely clear. No mastoid effusion. Inner ear structures normal. Other: None. IMPRESSION: 1. No acute intracranial infarct or other abnormality identified. 2. Mild for age chronic microvascular ischemic disease. 3. 16 mm meningioma overlying the right frontal convexity without associated mass effect. Electronically Signed   By: Jeannine Boga M.D.   On: 04/04/2017 20:19    Assessment & Plan:   Claudia [name] was seen today for hypertension, hypothyroidism and osteoarthritis.  Diagnoses and all orders for this visit:  Acquired hypothyroidism-  Her TSH remains mildly elevated but at her age the threshold of treatment is a TSH of nearly 10.  Also, she is not symptomatic with this so at this time I do not think she should take a thyroid replacement. -     TSH; Future  Essential hypertension, benign- Her blood pressure is well controlled.  Electrolytes and renal function are normal. -     Basic metabolic panel; Future  Primary osteoarthritis of both knees -     HYDROcodone-acetaminophen  (NORCO/VICODIN) 5-325 MG tablet; Take 1 tablet by mouth every 6 (six) hours as needed for moderate pain.  Postlaminectomy syndrome, lumbar region -     HYDROcodone-acetaminophen (NORCO/VICODIN) 5-325 MG tablet; Take 1 tablet by mouth every 6 (six) hours as needed for moderate pain.  Lumbosacral spondylosis without myelopathy -     HYDROcodone-acetaminophen (NORCO/VICODIN) 5-325 MG tablet; Take 1 tablet by mouth every 6 (six) hours as needed for moderate pain.  Cortical age-related cataract of both eyes -     Ambulatory referral to Ophthalmology  Other orders -     Tdap vaccine greater than or equal to 7yo IM   I am having Claudia [name] maintain her aspirin, vitamin C, vitamin E, ipratropium, cetirizine, linaclotide, telmisartan-hydrochlorothiazide, and HYDROcodone-acetaminophen.  Meds ordered this encounter  Medications  . HYDROcodone-acetaminophen (NORCO/VICODIN) 5-325 MG tablet    Sig: Take 1 tablet by mouth every 6 (six) hours as needed for moderate pain.    Dispense:  90 tablet    Refill:  0     Follow-up: Return in about 6 months (around 02/20/2018).  [name], MD

## 2017-08-25 ENCOUNTER — Encounter: Payer: Self-pay | Admitting: Cardiovascular Disease

## 2017-08-25 ENCOUNTER — Ambulatory Visit: Payer: PPO | Admitting: Cardiovascular Disease

## 2017-08-25 VITALS — BP 130/60 | HR 49 | Ht 71.0 in | Wt 235.6 lb

## 2017-08-25 DIAGNOSIS — I10 Essential (primary) hypertension: Secondary | ICD-10-CM

## 2017-08-25 DIAGNOSIS — R001 Bradycardia, unspecified: Secondary | ICD-10-CM

## 2017-08-25 NOTE — Progress Notes (Signed)
Cardiology Office Note   Date:  08/25/2017   ID:  Claudia, [name] 10/01/1933, MRN [id number]  PCP:  Janith Lima, MD  Cardiologist:   Skeet Latch, MD   No chief complaint on file.    History of Present Illness: Claudia [name] is a 84 y.o. female with hyperlipidemia, hyperlipidemia, and hypothyroidism who is being seen today for the evaluation of bradycardia at the request of Janith Lima, MD.  Claudia [name] reports that she has been feeling well.  She exercises at the gym 2-3 times per we.  She rides the bike, walks on the treadmill, and lifts weights.  She has no chest pain or shortness of breath with this activity.  She does sometimes get tired when cleaning and vacuuming her home.  Her main limitation is her back pain.  She does not have lightheadedness or dizziness with normal activity but does get lightheaded when she bends over and stands up quickly to sleep.  She occasionally has lower extremity edema that improves with elevation of her legs.  She denies orthopnea or PND.  Claudia [name] saw Dr. Harrington Challenger 03/2016 for bradycardia.  At that time she was referred for an echo that revealed LVEF 55-60% with moderate LVH.  Past Medical History:  Diagnosis Date  . Abnormality of gait   . Brachial neuritis or radiculitis NOS   . Degeneration of lumbar or lumbosacral intervertebral disc   . Essential hypertension, benign   . Gouty arthropathy   . Lumbago   . Obesity   . Osteoarthritis   . Pure hypercholesterolemia   . Unspecified hypothyroidism     Past Surgical History:  Procedure Laterality Date  . ABDOMINAL HYSTERECTOMY  03/13/2010  . BACK SURGERY    . LUMBAR LAMINECTOMY  03/13/2010  . right knee replacement  03/13/2010     Current Outpatient Medications  Medication Sig Dispense Refill  . aspirin 81 MG tablet Take 81 mg by mouth daily.    . cetirizine (ZYRTEC) 10 MG tablet Take 1 tablet (10 mg total) by mouth daily. 90 tablet 1  . HYDROcodone-acetaminophen  (NORCO/VICODIN) 5-325 MG tablet Take 1 tablet by mouth every 6 (six) hours as needed for moderate pain. 90 tablet 0  . ipratropium (ATROVENT) 0.03 % nasal spray Place 2 sprays into both nostrils every 12 (twelve) hours. (Patient taking differently: Place 2 sprays into both nostrils every 12 (twelve) hours as needed for rhinitis. ) 30 mL 12  . linaclotide (LINZESS) 290 MCG CAPS capsule Take 1 capsule (290 mcg total) by mouth daily before breakfast. 90 capsule 1  . telmisartan-hydrochlorothiazide (MICARDIS HCT) 80-12.5 MG tablet Take 1 tablet by mouth daily. 90 tablet 1  . vitamin C (ASCORBIC ACID) 500 MG tablet Take 500 mg by mouth daily.    . vitamin E 400 UNIT capsule Take 400 Units by mouth daily.     No current facility-administered medications for this visit.     Allergies:   Lipitor [atorvastatin] and Trileptal [oxcarbazepine]    Social History:  The patient  reports that  has never smoked. she has never used smokeless tobacco. She reports that she does not drink alcohol or use drugs.   Family History:  The patient's family history includes Alzheimer's disease in her mother; Cirrhosis in her brother; Diabetes in her brother, sister, sister, and sister; Heart attack in her brother and father; Heart disease in her father; Hypertension in her mother.    ROS:  Please see the history  of present illness.   Otherwise, review of systems are positive for none.   All other systems are reviewed and negative.    PHYSICAL EXAM: VS:  BP 130/60   Pulse (!) 49   Ht 5\' 11"  (1.803 m)   Wt 235 lb 9.6 oz (106.9 kg)   BMI 32.86 kg/m  , BMI Body mass index is 32.86 kg/m. GENERAL:  Well appearing HEENT:  Pupils equal round and reactive, fundi not visualized, oral mucosa unremarkable NECK:  No jugular venous distention, waveform within normal limits, carotid upstroke brisk and symmetric, no bruits LUNGS:  Clear to auscultation bilaterally HEART:  Bradycardic.  Regular rhythm.  PMI not displaced or  sustained,S1 and S2 within normal limits, no S3, no S4, no clicks, no rubs, no murmurs ABD:  Flat, positive bowel sounds normal in frequency in pitch, no bruits, no rebound, no guarding, no midline pulsatile mass, no hepatomegaly, no splenomegaly EXT:  2 plus pulses throughout, no edema, no cyanosis no clubbing SKIN:  No rashes no nodules NEURO:  Cranial nerves II through XII grossly intact, motor grossly intact throughout PSYCH:  Cognitively intact, oriented to person place and time   EKG:  EKG is ordered today. The ekg ordered today demonstrates sinus bradycardia.  Rate 49 bpm.    Echo 03/30/16: Study Conclusions  - Left ventricle: The cavity size was normal. Wall thickness was   increased in a pattern of moderate LVH. There was focal basal   hypertrophy. Systolic function was normal. The estimated ejection   fraction was in the range of 55% to 60%. Wall motion was normal;   there were no regional wall motion abnormalities. Left   ventricular diastolic function parameters were normal for the   patient&'s age. - Aortic valve: There was trivial regurgitation. - Left atrium: The atrium was moderately dilated. - Pulmonary arteries: Systolic pressure was mildly increased. PA   peak pressure: 31 mm Hg (S).  Recent Labs: 04/04/2017: ALT 11; Hemoglobin 13.3; Platelets 203 08/23/2017: BUN 23; Creatinine, Ser 1.00; Potassium 4.1; Sodium 140; TSH 5.71    Lipid Panel    Component Value Date/Time   CHOL 181 09/08/2016 1134   TRIG 67.0 09/08/2016 1134   HDL 61.80 09/08/2016 1134   CHOLHDL 3 09/08/2016 1134   VLDL 13.4 09/08/2016 1134   LDLCALC 106 (H) 09/08/2016 1134   LDLDIRECT 158.9 09/05/2012 1038      Wt Readings from Last 3 Encounters:  08/25/17 235 lb 9.6 oz (106.9 kg)  08/23/17 234 lb (106.1 kg)  04/19/17 246 lb (111.6 kg)      ASSESSMENT AND PLAN:  # Bradycardia: Asymptomatic.  No indication for pacing at this time.  We will continue to follow her.  # Hypertension:  Blood pressure well-controlled on telmisartan and hydrochlorothiazide.  Avoid nodal agents.   Current medicines are reviewed at length with the patient today.  The patient does not have concerns regarding medicines.  The following changes have been made:  no change  Labs/ tests ordered today include:  No orders of the defined types were placed in this encounter.    Disposition:   FU with Morrissa Shein C. Oval Linsey, MD, [facility] in 1 year    This note was written with the assistance of speech recognition software.  Please excuse any transcriptional errors.  Signed, Martavius Lusty C. Oval Linsey, MD, [facility]  08/25/2017 4:57 PM    [place]

## 2017-08-25 NOTE — Patient Instructions (Signed)

## 2017-09-01 DIAGNOSIS — H25813 Combined forms of age-related cataract, bilateral: Secondary | ICD-10-CM | POA: Diagnosis not present

## 2017-09-22 DIAGNOSIS — Z01818 Encounter for other preprocedural examination: Secondary | ICD-10-CM | POA: Diagnosis not present

## 2017-09-22 DIAGNOSIS — H25812 Combined forms of age-related cataract, left eye: Secondary | ICD-10-CM | POA: Diagnosis not present

## 2017-09-26 DIAGNOSIS — H25812 Combined forms of age-related cataract, left eye: Secondary | ICD-10-CM | POA: Diagnosis not present

## 2017-09-26 DIAGNOSIS — H2512 Age-related nuclear cataract, left eye: Secondary | ICD-10-CM | POA: Diagnosis not present

## 2017-10-05 DIAGNOSIS — H2511 Age-related nuclear cataract, right eye: Secondary | ICD-10-CM | POA: Diagnosis not present

## 2017-10-05 DIAGNOSIS — H25811 Combined forms of age-related cataract, right eye: Secondary | ICD-10-CM | POA: Diagnosis not present

## 2017-10-06 ENCOUNTER — Other Ambulatory Visit: Payer: Self-pay | Admitting: Internal Medicine

## 2017-10-06 ENCOUNTER — Telehealth: Payer: Self-pay | Admitting: Internal Medicine

## 2017-10-06 DIAGNOSIS — M170 Bilateral primary osteoarthritis of knee: Secondary | ICD-10-CM

## 2017-10-06 DIAGNOSIS — M47817 Spondylosis without myelopathy or radiculopathy, lumbosacral region: Secondary | ICD-10-CM

## 2017-10-06 DIAGNOSIS — M961 Postlaminectomy syndrome, not elsewhere classified: Secondary | ICD-10-CM

## 2017-10-06 MED ORDER — HYDROCODONE-ACETAMINOPHEN 5-325 MG PO TABS: 1.0000 | ORAL_TABLET | Freq: Four times a day (QID) | ORAL | 0 refills | Status: DC | PRN

## 2017-10-06 NOTE — Telephone Encounter (Signed)
Copied from [place] [id number]. Topic: Quick Communication - Rx Refill/Question >> Oct 06, 2017 10:16 AM Aurelio Brash B wrote: Medication: HYDROcodone-acetaminophen (NORCO/VICODIN) 5-325 MG tablet   Has the patient contacted their pharmacy? Yes  (Agent: If no, request that the patient contact the pharmacy for the refill.)  Preferred Pharmacy (with phone number or street name): [facility] [phone] - [place], [place] - [address] AT [address] [phone] (Phone) [phone] (Fax)       Agent: Please be advised that RX refills may take up to 3 business days. We ask that you follow-up with your pharmacy.

## 2017-11-22 ENCOUNTER — Other Ambulatory Visit: Payer: Self-pay | Admitting: Internal Medicine

## 2017-11-22 DIAGNOSIS — M47817 Spondylosis without myelopathy or radiculopathy, lumbosacral region: Secondary | ICD-10-CM

## 2017-11-22 DIAGNOSIS — M170 Bilateral primary osteoarthritis of knee: Secondary | ICD-10-CM

## 2017-11-22 DIAGNOSIS — M961 Postlaminectomy syndrome, not elsewhere classified: Secondary | ICD-10-CM

## 2017-11-22 MED ORDER — HYDROCODONE-ACETAMINOPHEN 5-325 MG PO TABS: 1.0000 | ORAL_TABLET | Freq: Four times a day (QID) | ORAL | 0 refills | Status: DC | PRN

## 2017-11-22 NOTE — Telephone Encounter (Signed)
Refill request Hydrocodone 5-325 mg  LOV 08/23/2017 DR Ronnald Ramp  Last Filled 10/06/2017  90 tabs  1 q 6 hrs  Dr Ronnald Ramp  Pharmacy on file

## 2017-11-22 NOTE — Telephone Encounter (Signed)
Copied from [place] [phone]. Topic: Quick Communication - Rx Refill/Question >> Nov 22, 2017 11:31 AM Claudia [name] wrote: Medication: HYDROcodone-acetaminophen (NORCO/VICODIN) 5-325 MG tablet [[id number]]   Has the patient contacted their pharmacy? Yes.   (Agent: If no, request that the patient contact the pharmacy for the refill.) (Agent: If yes, when and what did the pharmacy advise?)  [facility]  Agent: Please be advised that RX refills may take up to 3 business days. We ask that you follow-up with your pharmacy.

## 2017-12-13 ENCOUNTER — Telehealth: Payer: Self-pay | Admitting: Emergency Medicine

## 2017-12-13 NOTE — Telephone Encounter (Signed)
Called patient to schedule AWV. Patient declined at this time. 

## 2017-12-26 ENCOUNTER — Telehealth: Payer: Self-pay | Admitting: Internal Medicine

## 2017-12-26 ENCOUNTER — Other Ambulatory Visit: Payer: Self-pay | Admitting: Internal Medicine

## 2017-12-26 DIAGNOSIS — M170 Bilateral primary osteoarthritis of knee: Secondary | ICD-10-CM

## 2017-12-26 DIAGNOSIS — K5903 Drug induced constipation: Secondary | ICD-10-CM

## 2017-12-26 DIAGNOSIS — M47817 Spondylosis without myelopathy or radiculopathy, lumbosacral region: Secondary | ICD-10-CM

## 2017-12-26 DIAGNOSIS — T402X5A Adverse effect of other opioids, initial encounter: Principal | ICD-10-CM

## 2017-12-26 DIAGNOSIS — K5909 Other constipation: Secondary | ICD-10-CM

## 2017-12-26 DIAGNOSIS — M961 Postlaminectomy syndrome, not elsewhere classified: Secondary | ICD-10-CM

## 2017-12-26 MED ORDER — HYDROCODONE-ACETAMINOPHEN 5-325 MG PO TABS: 1.0000 | ORAL_TABLET | Freq: Four times a day (QID) | ORAL | 0 refills | Status: DC | PRN

## 2017-12-26 MED ORDER — LUBIPROSTONE 24 MCG PO CAPS: 24.0000 ug | ORAL_CAPSULE | Freq: Two times a day (BID) | ORAL | 1 refills | Status: DC

## 2017-12-26 NOTE — Telephone Encounter (Signed)
Copied from [place] [phone]. Topic: Quick Communication - Rx Refill/Question >> Dec 26, 2017  1:31 PM Scherrie Gerlach wrote: Medication: HYDROcodone-acetaminophen (NORCO/VICODIN) 5-325 MG tablet                    AMITIZA 24 MCG capsule (this Rx expired) [facility] [phone] - [place], [address] AT [address] [phone] (Phone) [phone] (Fax)

## 2017-12-26 NOTE — Telephone Encounter (Signed)
Check [place] registry last filled 11/22/2017.Marland KitchenJohny Chess

## 2017-12-26 NOTE — Telephone Encounter (Signed)
[facility].Marland KitchenChryl Heck

## 2018-01-20 ENCOUNTER — Other Ambulatory Visit: Payer: Self-pay | Admitting: Internal Medicine

## 2018-01-20 DIAGNOSIS — I10 Essential (primary) hypertension: Secondary | ICD-10-CM

## 2018-01-27 ENCOUNTER — Other Ambulatory Visit: Payer: Self-pay | Admitting: Internal Medicine

## 2018-01-27 DIAGNOSIS — M170 Bilateral primary osteoarthritis of knee: Secondary | ICD-10-CM

## 2018-01-27 DIAGNOSIS — M961 Postlaminectomy syndrome, not elsewhere classified: Secondary | ICD-10-CM

## 2018-01-27 DIAGNOSIS — M47817 Spondylosis without myelopathy or radiculopathy, lumbosacral region: Secondary | ICD-10-CM

## 2018-01-27 NOTE — Telephone Encounter (Signed)
Copied from [place] [phone]. Topic: Quick Communication - Rx Refill/Question >> Jan 27, 2018  9:19 AM Keene Breath wrote: Medication: HYDROcodone-acetaminophen (NORCO/VICODIN) 5-325 MG tablet  Patient called to request a refill for the above medication.  CB# [phone]  Preferred Pharmacy (with phone number or street name): [facility] [place], [address] [phone] (Phone) [phone] (Fax)

## 2018-01-27 NOTE — Telephone Encounter (Signed)
LOV 08/23/17 Dr. Ronnald Ramp Last refill 12/26/17   # 45 with 0 refill

## 2018-01-31 MED ORDER — HYDROCODONE-ACETAMINOPHEN 5-325 MG PO TABS: 1.0000 | ORAL_TABLET | Freq: Four times a day (QID) | ORAL | 0 refills | Status: DC | PRN

## 2018-02-20 ENCOUNTER — Encounter: Payer: Self-pay | Admitting: Internal Medicine

## 2018-02-20 ENCOUNTER — Other Ambulatory Visit (INDEPENDENT_AMBULATORY_CARE_PROVIDER_SITE_OTHER): Payer: PPO

## 2018-02-20 ENCOUNTER — Ambulatory Visit (INDEPENDENT_AMBULATORY_CARE_PROVIDER_SITE_OTHER): Payer: PPO | Admitting: Internal Medicine

## 2018-02-20 VITALS — BP 146/70 | HR 53 | Temp 97.8°F | Ht 71.0 in | Wt 235.0 lb

## 2018-02-20 DIAGNOSIS — E7800 Pure hypercholesterolemia, unspecified: Secondary | ICD-10-CM

## 2018-02-20 DIAGNOSIS — K5904 Chronic idiopathic constipation: Secondary | ICD-10-CM | POA: Diagnosis not present

## 2018-02-20 DIAGNOSIS — E038 Other specified hypothyroidism: Secondary | ICD-10-CM

## 2018-02-20 DIAGNOSIS — I10 Essential (primary) hypertension: Secondary | ICD-10-CM

## 2018-02-20 LAB — CBC WITH DIFFERENTIAL/PLATELET
Basophils Absolute: 0.1 10*3/uL (ref 0.0–0.1)
Basophils Relative: 1.5 % (ref 0.0–3.0)
EOS PCT: 7.3 % — AB (ref 0.0–5.0)
Eosinophils Absolute: 0.3 10*3/uL (ref 0.0–0.7)
HCT: 39.5 % (ref 36.0–46.0)
Hemoglobin: 13.1 g/dL (ref 12.0–15.0)
LYMPHS ABS: 1.5 10*3/uL (ref 0.7–4.0)
Lymphocytes Relative: 34 % (ref 12.0–46.0)
MCHC: 33 g/dL (ref 30.0–36.0)
MCV: 86.2 fl (ref 78.0–100.0)
MONOS PCT: 11.5 % (ref 3.0–12.0)
Monocytes Absolute: 0.5 10*3/uL (ref 0.1–1.0)
NEUTROS ABS: 2 10*3/uL (ref 1.4–7.7)
NEUTROS PCT: 45.7 % (ref 43.0–77.0)
Platelets: 208 10*3/uL (ref 150.0–400.0)
RBC: 4.59 Mil/uL (ref 3.87–5.11)
RDW: 14.3 % (ref 11.5–15.5)
WBC: 4.5 10*3/uL (ref 4.0–10.5)

## 2018-02-20 LAB — COMPREHENSIVE METABOLIC PANEL
ALK PHOS: 78 U/L (ref 39–117)
ALT: 8 U/L (ref 0–35)
AST: 12 U/L (ref 0–37)
Albumin: 4 g/dL (ref 3.5–5.2)
BILIRUBIN TOTAL: 0.9 mg/dL (ref 0.2–1.2)
BUN: 21 mg/dL (ref 6–23)
CALCIUM: 10.1 mg/dL (ref 8.4–10.5)
CO2: 30 mEq/L (ref 19–32)
Chloride: 106 mEq/L (ref 96–112)
Creatinine, Ser: 0.99 mg/dL (ref 0.40–1.20)
GFR: 69.06 mL/min (ref >60.00)
Glucose, Bld: 96 mg/dL (ref 70–99)
Potassium: 5 mEq/L (ref 3.5–5.1)
Sodium: 140 mEq/L (ref 135–145)
TOTAL PROTEIN: 7 g/dL (ref 6.0–8.3)

## 2018-02-20 LAB — LIPID PANEL
CHOLESTEROL: 223 mg/dL — AB (ref 0–200)
HDL: 61.4 mg/dL (ref >39.00)
LDL Cholesterol: 146 mg/dL — ABNORMAL HIGH (ref 0–99)
NONHDL: 161.46
Total CHOL/HDL Ratio: 4
Triglycerides: 76 mg/dL (ref 0.0–149.0)
VLDL: 15.2 mg/dL (ref 0.0–40.0)

## 2018-02-20 LAB — TSH: TSH: 5.79 u[IU]/mL — ABNORMAL HIGH (ref 0.35–4.50)

## 2018-02-20 MED ORDER — LINACLOTIDE 290 MCG PO CAPS: 290.0000 ug | ORAL_CAPSULE | Freq: Every day | ORAL | 1 refills | Status: DC

## 2018-02-20 NOTE — Patient Instructions (Signed)
Hypothyroidism Hypothyroidism is a disorder of the thyroid. The thyroid is a large gland that is located in the lower front of the neck. The thyroid releases hormones that control how the body works. With hypothyroidism, the thyroid does not make enough of these hormones. What are the causes? Causes of hypothyroidism may include:  Viral infections.  Pregnancy.  Your own defense system (immune system) attacking your thyroid.  Certain medicines.  Birth defects.  Past radiation treatments to your head or neck.  Past treatment with radioactive iodine.  Past surgical removal of part or all of your thyroid.  Problems with the gland that is located in the center of your brain (pituitary).  What are the signs or symptoms? Signs and symptoms of hypothyroidism may include:  Feeling as though you have no energy (lethargy).  Inability to tolerate cold.  Weight gain that is not explained by a change in diet or exercise habits.  Dry skin.  Coarse hair.  Menstrual irregularity.  Slowing of thought processes.  Constipation.  Sadness or depression.  How is this diagnosed? Your health care provider may diagnose hypothyroidism with blood tests and ultrasound tests. How is this treated? Hypothyroidism is treated with medicine that replaces the hormones that your body does not make. After you begin treatment, it may take several weeks for symptoms to go away. Follow these instructions at home:  Take medicines only as directed by your health care provider.  If you start taking any new medicines, tell your health care provider.  Keep all follow-up visits as directed by your health care provider. This is important. As your condition improves, your dosage needs may change. You will need to have blood tests regularly so that your health care provider can watch your condition. Contact a health care provider if:  Your symptoms do not get better with treatment.  You are taking thyroid  replacement medicine and: ? You sweat excessively. ? You have tremors. ? You feel anxious. ? You lose weight rapidly. ? You cannot tolerate heat. ? You have emotional swings. ? You have diarrhea. ? You feel weak. Get help right away if:  You develop chest pain.  You develop an irregular heartbeat.  You develop a rapid heartbeat. This information is not intended to replace advice given to you by your health care provider. Make sure you discuss any questions you have with your health care provider. Document Released: 06/14/2005 Document Revised: 11/20/2015 Document Reviewed: 10/30/2013 Elsevier Interactive Patient Education  2018 Elsevier Inc.  

## 2018-02-20 NOTE — Progress Notes (Signed)
Subjective:  Patient ID: Claudia [name], female    DOB: February 02, 1934  Age: 84 y.o. MRN: [id number]  CC: Hypertension; Hypothyroidism; and Hyperlipidemia   HPI ADONAI HELZER presents for f/up - She is very active, works out at [employer] every other day and denies any recent episodes of CP, DOE, palpitations, edema, or fatigue.  She does struggle with chronic pain.  She also struggles with chronic constipation.  She tried amities of but says it did not help.  She does get symptom relief with Linzess.  Outpatient Medications Prior to Visit  Medication Sig Dispense Refill  . cetirizine (ZYRTEC) 10 MG tablet Take 1 tablet (10 mg total) by mouth daily. 90 tablet 1  . HYDROcodone-acetaminophen (NORCO/VICODIN) 5-325 MG tablet Take 1 tablet by mouth every 6 (six) hours as needed for moderate pain. 90 tablet 0  . ipratropium (ATROVENT) 0.03 % nasal spray Place 2 sprays into both nostrils every 12 (twelve) hours. (Patient taking differently: Place 2 sprays into both nostrils every 12 (twelve) hours as needed for rhinitis. ) 30 mL 12  . telmisartan-hydrochlorothiazide (MICARDIS HCT) 80-12.5 MG tablet TAKE 1 TABLET BY MOUTH ONCE DAILY 90 tablet 0  . vitamin C (ASCORBIC ACID) 500 MG tablet Take 500 mg by mouth daily.    . vitamin E 400 UNIT capsule Take 400 Units by mouth daily.    [name] aspirin 81 MG tablet Take 81 mg by mouth daily.    [name] lubiprostone (AMITIZA) 24 MCG capsule Take 1 capsule (24 mcg total) by mouth 2 (two) times daily with a meal. 180 capsule 1  . LINZESS 290 MCG CAPS capsule TAKE 1 CAPSULE BY MOUTH DAILY BEFORE BREAKFAST  1   No facility-administered medications prior to visit.     ROS Review of Systems  Constitutional: Negative for diaphoresis, fatigue and unexpected weight change.  HENT: Negative.   Eyes: Negative.   Respiratory: Negative.  Negative for cough, chest tightness, shortness of breath and wheezing.   Cardiovascular: Negative.  Negative for chest pain, palpitations  and leg swelling.  Gastrointestinal: Positive for constipation. Negative for abdominal pain, blood in stool, diarrhea, nausea and vomiting.  Endocrine: Negative for cold intolerance and heat intolerance.  Genitourinary: Negative.  Negative for difficulty urinating.  Musculoskeletal: Positive for arthralgias. Negative for back pain, myalgias and neck pain.  Skin: Negative.  Negative for color change.  Neurological: Negative.  Negative for dizziness, weakness and light-headedness.  Hematological: Negative for adenopathy. Does not bruise/bleed easily.  Psychiatric/Behavioral: Negative.     Objective:  BP (!) 146/70 (BP Location: Left Arm, Patient Position: Sitting, Cuff Size: Normal)   Pulse (!) 53   Temp 97.8 F (36.6 C) (Oral)   Ht 5\' 11"  (1.803 m)   Wt 235 lb (106.6 kg)   SpO2 98%   BMI 32.78 kg/m   BP Readings from Last 3 Encounters:  02/20/18 (!) 146/70  08/25/17 130/60  08/23/17 140/72    Wt Readings from Last 3 Encounters:  02/20/18 235 lb (106.6 kg)  08/25/17 235 lb 9.6 oz (106.9 kg)  08/23/17 234 lb (106.1 kg)    Physical Exam  Constitutional: She is oriented to person, place, and time. No distress.  HENT:  Mouth/Throat: No oropharyngeal exudate.  Eyes: Conjunctivae are normal. Right eye exhibits no discharge. Left eye exhibits no discharge. No scleral icterus.  Neck: Normal range of motion. Neck supple. No JVD present. No thyromegaly present.  Cardiovascular: Normal rate, regular rhythm and normal heart sounds. Exam reveals  no gallop and no friction rub.  No murmur heard. Pulmonary/Chest: Effort normal and breath sounds normal. No respiratory distress. She has no wheezes. She has no rales.  Abdominal: Soft. Normal appearance and bowel sounds are normal. She exhibits no mass. There is no hepatosplenomegaly. There is no tenderness.  Musculoskeletal: Normal range of motion. She exhibits no edema, tenderness or deformity.  Lymphadenopathy:    She has no cervical  adenopathy.  Neurological: She is alert and oriented to person, place, and time.  Skin: Skin is warm and dry. She is not diaphoretic. No erythema. No pallor.  Psychiatric: She has a normal mood and affect. Her behavior is normal. Judgment and thought content normal.  Vitals reviewed.   Lab Results  Component Value Date   WBC 4.5 02/20/2018   HGB 13.1 02/20/2018   HCT 39.5 02/20/2018   PLT 208.0 02/20/2018   GLUCOSE 96 02/20/2018   CHOL 223 (H) 02/20/2018   TRIG 76.0 02/20/2018   HDL 61.40 02/20/2018   LDLDIRECT 158.9 09/05/2012   LDLCALC 146 (H) 02/20/2018   ALT 8 02/20/2018   AST 12 02/20/2018   NA 140 02/20/2018   K 5.0 02/20/2018   CL 106 02/20/2018   CREATININE 0.99 02/20/2018   BUN 21 02/20/2018   CO2 30 02/20/2018   TSH 5.79 (H) 02/20/2018   INR 1.00 04/04/2017   HGBA1C 5.8 (H) 08/24/2015    Ct Head Wo Contrast  Result Date: 04/04/2017 CLINICAL DATA:  Dizziness for 4 days. EXAM: CT HEAD WITHOUT CONTRAST TECHNIQUE: Contiguous axial images were obtained from the base of the skull through the vertex without intravenous contrast. COMPARISON:  Head CT scan 12/07/2013. FINDINGS: Brain: Appears normal without hemorrhage, infarct, mass lesion, mass effect, midline shift or abnormal extra-axial fluid collection. No hydrocephalus or pneumocephalus. Vascular: Atherosclerosis noted. Skull: Intact. Sinuses/Orbits: Negative. Other: None. IMPRESSION: No acute abnormality. Atherosclerosis. Electronically Signed   By: Inge Rise M.D.   On: 04/04/2017 15:55   Mr Brain Wo Contrast  Result Date: 04/04/2017 CLINICAL DATA:  Initial evaluation for focal neuro deficits. EXAM: MRI HEAD WITHOUT CONTRAST TECHNIQUE: Multiplanar, multiecho pulse sequences of the brain and surrounding structures were obtained without intravenous contrast. COMPARISON:  Prior CT from 04/04/2017. FINDINGS: Brain: Diffuse prominence of the CSF containing spaces compatible with generalized cerebral atrophy. Mild  chronic microvascular ischemic changes present within the periventricular white matter. No abnormal foci of restricted diffusion to suggest acute or subacute ischemia. Gray-white matter differentiation maintained. No encephalomalacia to suggest chronic infarction. No susceptibility artifact to suggest acute or chronic intracranial hemorrhage. 16 mm meningioma overlies the right frontal convexity without significant mass effect (series 7, image 21). No other mass lesion. No midline shift or mass effect. No hydrocephalus. No extra-axial fluid collection. Major dural sinuses are grossly patent. Pituitary gland suprasellar region normal. Midline structures intact and within normal limits. Vascular: Major intracranial vascular flow voids are maintained. Skull and upper cervical spine: Craniocervical junction within normal limits. Degenerative spondylolysis noted at C3-4 with resultant mild spinal stenosis. Remainder the visualized upper cervical spine otherwise unremarkable. Bone marrow signal intensity within normal limits. No scalp soft tissue abnormality. Sinuses/Orbits: Globes and orbital soft tissues within normal limits. Paranasal sinuses are largely clear. No mastoid effusion. Inner ear structures normal. Other: None. IMPRESSION: 1. No acute intracranial infarct or other abnormality identified. 2. Mild for age chronic microvascular ischemic disease. 3. 16 mm meningioma overlying the right frontal convexity without associated mass effect. Electronically Signed   By: Jeannine Boga  M.D.   On: 04/04/2017 20:19    Assessment & Plan:   Ebonye was seen today for hypertension, hypothyroidism and hyperlipidemia.  Diagnoses and all orders for this visit:  Other specified hypothyroidism- Her TSH is normal for her age at 79.79.  She appears clinically euthyroid.  Thyroid replacement therapy is not indicated. -     TSH; Future  Essential hypertension, benign- Her blood pressure is well controlled.   Electrolytes and renal function are normal. -     CBC with Differential/Platelet; Future -     Comprehensive metabolic panel; Future  Pure hypercholesterolemia- Statin therapy is not indicated. -     Lipid panel; Future  Chronic idiopathic constipation -     linaclotide (LINZESS) 290 MCG CAPS capsule; Take 1 capsule (290 mcg total) by mouth daily before breakfast.   I have discontinued Kaedence Connelly. Maslanka's aspirin, lubiprostone, and LINZESS. I am also having her start on linaclotide. Additionally, I am having her maintain her vitamin C, vitamin E, ipratropium, cetirizine, telmisartan-hydrochlorothiazide, and HYDROcodone-acetaminophen.  Meds ordered this encounter  Medications  . linaclotide (LINZESS) 290 MCG CAPS capsule    Sig: Take 1 capsule (290 mcg total) by mouth daily before breakfast.    Dispense:  90 capsule    Refill:  1     Follow-up: Return in about 4 months (around 06/22/2018).  [name], MD

## 2018-02-28 ENCOUNTER — Other Ambulatory Visit: Payer: Self-pay | Admitting: Internal Medicine

## 2018-02-28 DIAGNOSIS — M961 Postlaminectomy syndrome, not elsewhere classified: Secondary | ICD-10-CM

## 2018-02-28 DIAGNOSIS — M170 Bilateral primary osteoarthritis of knee: Secondary | ICD-10-CM

## 2018-02-28 DIAGNOSIS — M47817 Spondylosis without myelopathy or radiculopathy, lumbosacral region: Secondary | ICD-10-CM

## 2018-02-28 MED ORDER — HYDROCODONE-ACETAMINOPHEN 5-325 MG PO TABS: 1.0000 | ORAL_TABLET | Freq: Four times a day (QID) | ORAL | 0 refills | Status: DC | PRN

## 2018-02-28 NOTE — Telephone Encounter (Signed)
Rx refill request: hydrocodone-acetaminophen 5-325 mg        Last filled: 01/31/18  LOV: 02/20/18  PCP: [place]: verified

## 2018-02-28 NOTE — Telephone Encounter (Signed)
Check [place] registry last filled 01/31/2018.Marland KitchenJohny Chess

## 2018-02-28 NOTE — Telephone Encounter (Signed)
Copied from [place] [phone]. Topic: General - Other >> Feb 28, 2018 10:58 AM Oneta Rack wrote: Relation to pt: self  Call back number: [phone] Pharmacy:  Reason for call:  Patient requesting HYDROcodone-acetaminophen (NORCO/VICODIN) 5-325 MG tablet, pharmacy contacted and patient informed please allow 48 to 72 hour turn around time, please advise

## 2018-03-01 NOTE — Telephone Encounter (Signed)
MD approved and sent electronically to pof../lmb  

## 2018-03-30 ENCOUNTER — Telehealth: Payer: Self-pay | Admitting: Internal Medicine

## 2018-03-30 DIAGNOSIS — M170 Bilateral primary osteoarthritis of knee: Secondary | ICD-10-CM

## 2018-03-30 DIAGNOSIS — M961 Postlaminectomy syndrome, not elsewhere classified: Secondary | ICD-10-CM

## 2018-03-30 DIAGNOSIS — M47817 Spondylosis without myelopathy or radiculopathy, lumbosacral region: Secondary | ICD-10-CM

## 2018-03-30 NOTE — Telephone Encounter (Signed)
Copied from [place] [phone]. Topic: Quick Communication - Rx Refill/Question >> Mar 30, 2018  9:26 AM Bea Graff, NT wrote: Medication: HYDROcodone-acetaminophen (NORCO/VICODIN) 5-325 MG tablet   Has the patient contacted their pharmacy? Yes.   (Agent: If no, request that the patient contact the pharmacy for the refill.) (Agent: If yes, when and what did the pharmacy advise?)  Preferred Pharmacy (with phone number or street name):   [phone] - [place], [place] - [address] AT [address] [phone] (Phone) [phone] (Fax)    Agent: Please be advised that RX refills may take up to 3 business days. We ask that you follow-up with your pharmacy.

## 2018-03-31 MED ORDER — HYDROCODONE-ACETAMINOPHEN 5-325 MG PO TABS: 1.0000 | ORAL_TABLET | Freq: Four times a day (QID) | ORAL | 0 refills | Status: DC | PRN

## 2018-03-31 NOTE — Telephone Encounter (Signed)
Controlled Substance Datatbase checked and last fill for hydrocodone 5-325mg  rx rq was 02/28/2018.  LOV was 02/20/2018 NOV is scheduled 06/13/2018  Can you advise in PCP absence?

## 2018-04-07 ENCOUNTER — Ambulatory Visit (INDEPENDENT_AMBULATORY_CARE_PROVIDER_SITE_OTHER): Payer: PPO

## 2018-04-07 DIAGNOSIS — Z23 Encounter for immunization: Secondary | ICD-10-CM

## 2018-04-21 ENCOUNTER — Other Ambulatory Visit: Payer: Self-pay | Admitting: Internal Medicine

## 2018-04-21 DIAGNOSIS — I10 Essential (primary) hypertension: Secondary | ICD-10-CM

## 2018-05-01 ENCOUNTER — Other Ambulatory Visit: Payer: Self-pay | Admitting: Internal Medicine

## 2018-05-01 ENCOUNTER — Telehealth: Payer: Self-pay | Admitting: Internal Medicine

## 2018-05-01 DIAGNOSIS — M961 Postlaminectomy syndrome, not elsewhere classified: Secondary | ICD-10-CM

## 2018-05-01 DIAGNOSIS — M47817 Spondylosis without myelopathy or radiculopathy, lumbosacral region: Secondary | ICD-10-CM

## 2018-05-01 DIAGNOSIS — M170 Bilateral primary osteoarthritis of knee: Secondary | ICD-10-CM

## 2018-05-01 MED ORDER — HYDROCODONE-ACETAMINOPHEN 5-325 MG PO TABS: 1.0000 | ORAL_TABLET | Freq: Four times a day (QID) | ORAL | 0 refills | Status: DC | PRN

## 2018-05-01 NOTE — Telephone Encounter (Signed)
Check [place] registry last filled 03/31/2018../lmb  

## 2018-05-01 NOTE — Telephone Encounter (Signed)
Copied from [place] [phone]. Topic: Quick Communication - See Telephone Encounter >> May 01, 2018 10:44 AM Conception Chancy, NT wrote: CRM for notification. See Telephone encounter for: 05/01/18.  Patient is calling and requesting a refill on HYDROcodone-acetaminophen (NORCO/VICODIN) 5-325 MG tablet.  [facility] [place], [place] - [address] [place] [place] [address] Phone: [phone] Fax: [phone]

## 2018-05-01 NOTE — Telephone Encounter (Signed)
MD approved and sent electronically to pof../lmb  

## 2018-05-31 ENCOUNTER — Other Ambulatory Visit: Payer: Self-pay | Admitting: Internal Medicine

## 2018-05-31 DIAGNOSIS — M47817 Spondylosis without myelopathy or radiculopathy, lumbosacral region: Secondary | ICD-10-CM

## 2018-05-31 DIAGNOSIS — M170 Bilateral primary osteoarthritis of knee: Secondary | ICD-10-CM

## 2018-05-31 DIAGNOSIS — M961 Postlaminectomy syndrome, not elsewhere classified: Secondary | ICD-10-CM

## 2018-05-31 NOTE — Telephone Encounter (Signed)
Copied from [place] [phone]. Topic: Quick Communication - Rx Refill/Question >> May 31, 2018  4:21 PM Margot Ables wrote: Medication: HYDROcodone-acetaminophen (NORCO/VICODIN) 5-325 MG tablet - pt states that she has 6 pills left - takes 3/day - pharmacy advised pt to contact her doctor to request and stated they were not able to  Has the patient contacted their pharmacy? yes Preferred Pharmacy (with phone number or street name): [facility] [place], [place] [address] [phone] (Phone) [phone] (Fax)

## 2018-06-01 MED ORDER — HYDROCODONE-ACETAMINOPHEN 5-325 MG PO TABS: 1.0000 | ORAL_TABLET | Freq: Four times a day (QID) | ORAL | 0 refills | Status: DC | PRN

## 2018-06-01 NOTE — Telephone Encounter (Signed)
Last filled 05/01/2018

## 2018-06-13 ENCOUNTER — Encounter: Payer: Self-pay | Admitting: Internal Medicine

## 2018-06-13 ENCOUNTER — Ambulatory Visit (INDEPENDENT_AMBULATORY_CARE_PROVIDER_SITE_OTHER): Payer: PPO | Admitting: Internal Medicine

## 2018-06-13 ENCOUNTER — Other Ambulatory Visit (INDEPENDENT_AMBULATORY_CARE_PROVIDER_SITE_OTHER): Payer: PPO

## 2018-06-13 VITALS — BP 140/70 | HR 45 | Temp 97.8°F | Resp 16 | Ht 71.0 in | Wt 232.0 lb

## 2018-06-13 DIAGNOSIS — M170 Bilateral primary osteoarthritis of knee: Secondary | ICD-10-CM

## 2018-06-13 DIAGNOSIS — I10 Essential (primary) hypertension: Secondary | ICD-10-CM

## 2018-06-13 DIAGNOSIS — M47817 Spondylosis without myelopathy or radiculopathy, lumbosacral region: Secondary | ICD-10-CM

## 2018-06-13 DIAGNOSIS — E039 Hypothyroidism, unspecified: Secondary | ICD-10-CM | POA: Diagnosis not present

## 2018-06-13 LAB — BASIC METABOLIC PANEL
BUN: 18 mg/dL (ref 6–23)
CO2: 26 mEq/L (ref 19–32)
Calcium: 9.9 mg/dL (ref 8.4–10.5)
Chloride: 107 mEq/L (ref 96–112)
Creatinine, Ser: 0.82 mg/dL (ref 0.40–1.20)
GFR: 85.77 mL/min (ref >60.00)
Glucose, Bld: 96 mg/dL (ref 70–99)
Potassium: 4.4 mEq/L (ref 3.5–5.1)
Sodium: 141 mEq/L (ref 135–145)

## 2018-06-13 LAB — TSH: TSH: 4.83 u[IU]/mL — ABNORMAL HIGH (ref 0.35–4.50)

## 2018-06-13 NOTE — Patient Instructions (Signed)

## 2018-06-13 NOTE — Progress Notes (Signed)
Subjective:  Patient ID: Claudia [name], female    DOB: 22-Jul-1933  Age: 85 y.o. MRN: [id number]  CC: Hypertension and Back Pain   HPI Claudia [name] presents for f/up - She continues to complain of nonradiating low back pain.  She has had 2 surgeries, has undergone epidural steroids and physical therapy.  She says the only thing that helps her with the pain is to take hydrocodone/acetaminophen several times a day.  She denies paresthesias in her lower extremities.  She also complains of chronic bilateral knee pain.  She otherwise feels well and offers no other complaints today.  Outpatient Medications Prior to Visit  Medication Sig Dispense Refill  . cetirizine (ZYRTEC) 10 MG tablet Take 1 tablet (10 mg total) by mouth daily. 90 tablet 1  . HYDROcodone-acetaminophen (NORCO/VICODIN) 5-325 MG tablet Take 1 tablet by mouth every 6 (six) hours as needed for moderate pain. 90 tablet 0  . ipratropium (ATROVENT) 0.03 % nasal spray Place 2 sprays into both nostrils every 12 (twelve) hours. (Patient taking differently: Place 2 sprays into both nostrils every 12 (twelve) hours as needed for rhinitis. ) 30 mL 12  . linaclotide (LINZESS) 290 MCG CAPS capsule Take 1 capsule (290 mcg total) by mouth daily before breakfast. 90 capsule 1  . telmisartan-hydrochlorothiazide (MICARDIS HCT) 80-12.5 MG tablet TAKE 1 TABLET BY MOUTH ONCE DAILY 90 tablet 1  . vitamin C (ASCORBIC ACID) 500 MG tablet Take 500 mg by mouth daily.    . vitamin E 400 UNIT capsule Take 400 Units by mouth daily.     No facility-administered medications prior to visit.     ROS Review of Systems  Constitutional: Negative for diaphoresis and fatigue.  HENT: Negative.   Eyes: Negative for visual disturbance.  Respiratory: Negative for cough, chest tightness, shortness of breath and wheezing.   Cardiovascular: Negative for chest pain, palpitations and leg swelling.  Gastrointestinal: Negative for abdominal pain, constipation,  diarrhea, nausea and vomiting.  Endocrine: Negative for cold intolerance and heat intolerance.  Genitourinary: Negative.  Negative for difficulty urinating.  Musculoskeletal: Positive for arthralgias and back pain. Negative for myalgias.  Skin: Negative for color change.  Neurological: Negative.  Negative for dizziness, weakness and light-headedness.  Hematological: Negative for adenopathy. Does not bruise/bleed easily.  Psychiatric/Behavioral: Negative.     Objective:  BP 140/70 (BP Location: Left Arm, Patient Position: Sitting, Cuff Size: Large)   Pulse (!) 45   Temp 97.8 F (36.6 C) (Oral)   Resp 16   Ht 5\' 11"  (1.803 m)   Wt 232 lb (105.2 kg)   SpO2 96%   BMI 32.36 kg/m   BP Readings from Last 3 Encounters:  06/13/18 140/70  02/20/18 (!) 146/70  08/25/17 130/60    Wt Readings from Last 3 Encounters:  06/13/18 232 lb (105.2 kg)  02/20/18 235 lb (106.6 kg)  08/25/17 235 lb 9.6 oz (106.9 kg)    Physical Exam Vitals signs reviewed.  Constitutional:      Appearance: She is normal weight. She is not ill-appearing.  HENT:     Nose: Nose normal.     Mouth/Throat:     Mouth: Mucous membranes are moist.  Eyes:     Conjunctiva/sclera: Conjunctivae normal.  Neck:     Musculoskeletal: Normal range of motion and neck supple.  Cardiovascular:     Rate and Rhythm: Normal rate and regular rhythm.     Heart sounds: No murmur. No gallop.   Pulmonary:  Effort: Pulmonary effort is normal.     Breath sounds: Normal breath sounds. No stridor. No wheezing or rales.  Abdominal:     General: Abdomen is flat. Bowel sounds are normal.     Palpations: Abdomen is soft. There is no hepatomegaly, splenomegaly or mass.     Tenderness: There is no abdominal tenderness.  Musculoskeletal: Normal range of motion.        General: No swelling or tenderness.     Right lower leg: No edema.     Left lower leg: No edema.  Skin:    General: Skin is warm and dry.  Neurological:     General:  No focal deficit present.     Mental Status: She is oriented to person, place, and time. Mental status is at baseline.     Comments: Neg SLR in BLE  Psychiatric:        Mood and Affect: Mood normal.        Behavior: Behavior normal.        Thought Content: Thought content normal.     Lab Results  Component Value Date   WBC 4.5 02/20/2018   HGB 13.1 02/20/2018   HCT 39.5 02/20/2018   PLT 208.0 02/20/2018   GLUCOSE 96 06/13/2018   CHOL 223 (H) 02/20/2018   TRIG 76.0 02/20/2018   HDL 61.40 02/20/2018   LDLDIRECT 158.9 09/05/2012   LDLCALC 146 (H) 02/20/2018   ALT 8 02/20/2018   AST 12 02/20/2018   NA 141 06/13/2018   K 4.4 06/13/2018   CL 107 06/13/2018   CREATININE 0.82 06/13/2018   BUN 18 06/13/2018   CO2 26 06/13/2018   TSH 4.83 (H) 06/13/2018   INR 1.00 04/04/2017   HGBA1C 5.8 (H) 08/24/2015    Ct Head Wo Contrast  Result Date: 04/04/2017 CLINICAL DATA:  Dizziness for 4 days. EXAM: CT HEAD WITHOUT CONTRAST TECHNIQUE: Contiguous axial images were obtained from the base of the skull through the vertex without intravenous contrast. COMPARISON:  Head CT scan 12/07/2013. FINDINGS: Brain: Appears normal without hemorrhage, infarct, mass lesion, mass effect, midline shift or abnormal extra-axial fluid collection. No hydrocephalus or pneumocephalus. Vascular: Atherosclerosis noted. Skull: Intact. Sinuses/Orbits: Negative. Other: None. IMPRESSION: No acute abnormality. Atherosclerosis. Electronically Signed   By: Inge Rise M.D.   On: 04/04/2017 15:55   Mr Brain Wo Contrast  Result Date: 04/04/2017 CLINICAL DATA:  Initial evaluation for focal neuro deficits. EXAM: MRI HEAD WITHOUT CONTRAST TECHNIQUE: Multiplanar, multiecho pulse sequences of the brain and surrounding structures were obtained without intravenous contrast. COMPARISON:  Prior CT from 04/04/2017. FINDINGS: Brain: Diffuse prominence of the CSF containing spaces compatible with generalized cerebral atrophy. Mild  chronic microvascular ischemic changes present within the periventricular white matter. No abnormal foci of restricted diffusion to suggest acute or subacute ischemia. Gray-white matter differentiation maintained. No encephalomalacia to suggest chronic infarction. No susceptibility artifact to suggest acute or chronic intracranial hemorrhage. 16 mm meningioma overlies the right frontal convexity without significant mass effect (series 7, image 21). No other mass lesion. No midline shift or mass effect. No hydrocephalus. No extra-axial fluid collection. Major dural sinuses are grossly patent. Pituitary gland suprasellar region normal. Midline structures intact and within normal limits. Vascular: Major intracranial vascular flow voids are maintained. Skull and upper cervical spine: Craniocervical junction within normal limits. Degenerative spondylolysis noted at C3-4 with resultant mild spinal stenosis. Remainder the visualized upper cervical spine otherwise unremarkable. Bone marrow signal intensity within normal limits. No scalp soft tissue abnormality.  Sinuses/Orbits: Globes and orbital soft tissues within normal limits. Paranasal sinuses are largely clear. No mastoid effusion. Inner ear structures normal. Other: None. IMPRESSION: 1. No acute intracranial infarct or other abnormality identified. 2. Mild for age chronic microvascular ischemic disease. 3. 16 mm meningioma overlying the right frontal convexity without associated mass effect. Electronically Signed   By: Jeannine Boga M.D.   On: 04/04/2017 20:19    Assessment & Plan:   Markee was seen today for hypertension and back pain.  Diagnoses and all orders for this visit:  Acquired hypothyroidism- Her TSH is very mildly elevated at 4.83.  She is asymptomatic.  At her age thyroid replacement therapy is not indicated. -     TSH; Future  Essential hypertension, benign- Her blood pressure is adequately well controlled.  Electrolytes and renal  function are normal. -     Basic metabolic panel; Future  Lumbosacral spondylosis without myelopathy- Will continue hydrocodone, acetaminophen as needed.  Primary osteoarthritis of both knees- As above   I am having Sharelle Burditt. Popson maintain her vitamin C, vitamin E, ipratropium, cetirizine, linaclotide, telmisartan-hydrochlorothiazide, and HYDROcodone-acetaminophen.  No orders of the defined types were placed in this encounter.    Follow-up: Return in about 4 months (around 10/13/2018).  [name], MD

## 2018-07-04 ENCOUNTER — Other Ambulatory Visit: Payer: Self-pay | Admitting: Internal Medicine

## 2018-07-04 DIAGNOSIS — M170 Bilateral primary osteoarthritis of knee: Secondary | ICD-10-CM

## 2018-07-04 DIAGNOSIS — M47817 Spondylosis without myelopathy or radiculopathy, lumbosacral region: Secondary | ICD-10-CM

## 2018-07-04 DIAGNOSIS — M961 Postlaminectomy syndrome, not elsewhere classified: Secondary | ICD-10-CM

## 2018-07-04 MED ORDER — HYDROCODONE-ACETAMINOPHEN 5-325 MG PO TABS: 1.0000 | ORAL_TABLET | Freq: Four times a day (QID) | ORAL | 0 refills | Status: DC | PRN

## 2018-07-04 NOTE — Telephone Encounter (Signed)
Copied from [place] [id number]. Topic: Quick Communication - Rx Refill/Question >> Jul 04, 2018 11:40 AM Windy Kalata wrote: Medication:  HYDROcodone-acetaminophen (NORCO/VICODIN) 5-325 MG tablet Has the patient contacted their pharmacy? Yes.   (Agent: If no, request that the patient contact the pharmacy for the refill.) (Agent: If yes, when and what did the pharmacy advise?) Call office for refill  Preferred Pharmacy (with phone number or street name): [facility] [phone] - [place], [place] - [address] [phone] (Phone) [phone] (Fax)    Agent: Please be advised that RX refills may take up to 3 business days. We ask that you follow-up with your pharmacy.

## 2018-07-04 NOTE — Telephone Encounter (Signed)
Check [name] registry last filled 06/01/2018.Marland KitchenJohny [name]

## 2018-08-01 ENCOUNTER — Telehealth: Payer: Self-pay | Admitting: Internal Medicine

## 2018-08-01 NOTE — Telephone Encounter (Signed)
Copied from [place] [id number]. Topic: Quick Communication - Rx Refill/Question >> Aug 01, 2018 11:00 AM Rayann Heman wrote: Medication: HYDROcodone-acetaminophen (NORCO/VICODIN) 5-325 MG tablet [[id number]]   Has the patient contacted their pharmacy? yes Preferred Pharmacy (with phone number or street name):[facility] [phone] - [place], [place] - [address] [phone] (Phone) [phone] (Fax)   Agent: Please be advised that RX refills may take up to 3 business days. We ask that you follow-up with your pharmacy.

## 2018-08-01 NOTE — Telephone Encounter (Signed)
Medication not delegated for NT to refill. 

## 2018-08-01 NOTE — Telephone Encounter (Signed)
Not due for refill until 08/04/2018

## 2018-08-03 ENCOUNTER — Other Ambulatory Visit: Payer: Self-pay | Admitting: Internal Medicine

## 2018-08-03 DIAGNOSIS — M961 Postlaminectomy syndrome, not elsewhere classified: Secondary | ICD-10-CM

## 2018-08-03 DIAGNOSIS — M170 Bilateral primary osteoarthritis of knee: Secondary | ICD-10-CM

## 2018-08-03 DIAGNOSIS — M47817 Spondylosis without myelopathy or radiculopathy, lumbosacral region: Secondary | ICD-10-CM

## 2018-08-03 MED ORDER — HYDROCODONE-ACETAMINOPHEN 5-325 MG PO TABS: 1.0000 | ORAL_TABLET | Freq: Four times a day (QID) | ORAL | 0 refills | Status: DC | PRN

## 2018-08-28 ENCOUNTER — Telehealth: Payer: Self-pay | Admitting: Internal Medicine

## 2018-08-28 NOTE — Telephone Encounter (Signed)
Copied from [place] [phone]. Topic: Quick Communication - See Telephone Encounter >> Aug 28, 2018 12:13 PM Ivar Drape wrote: CRM for notification. See Telephone encounter for: 08/28/18. Patient would like a refill on her HYDROcodone-acetaminophen (NORCO/VICODIN) 5-325 MG tablet medication and have it sent to her preferred pharmacy [facility] [address].

## 2018-08-28 NOTE — Telephone Encounter (Signed)
Refill is not due until 09/01/2018

## 2018-08-28 NOTE — Telephone Encounter (Signed)
Hydrocodone refill request.  

## 2018-08-30 NOTE — Telephone Encounter (Signed)
Rf req for Norco. Due 08/31/2018.   Database pulled.

## 2018-08-31 ENCOUNTER — Other Ambulatory Visit: Payer: Self-pay | Admitting: Internal Medicine

## 2018-08-31 DIAGNOSIS — M961 Postlaminectomy syndrome, not elsewhere classified: Secondary | ICD-10-CM

## 2018-08-31 DIAGNOSIS — M47817 Spondylosis without myelopathy or radiculopathy, lumbosacral region: Secondary | ICD-10-CM

## 2018-08-31 DIAGNOSIS — M170 Bilateral primary osteoarthritis of knee: Secondary | ICD-10-CM

## 2018-08-31 MED ORDER — HYDROCODONE-ACETAMINOPHEN 5-325 MG PO TABS: 1.0000 | ORAL_TABLET | Freq: Four times a day (QID) | ORAL | 0 refills | Status: DC | PRN

## 2018-09-27 ENCOUNTER — Other Ambulatory Visit: Payer: Self-pay | Admitting: Internal Medicine

## 2018-09-27 DIAGNOSIS — M961 Postlaminectomy syndrome, not elsewhere classified: Secondary | ICD-10-CM

## 2018-09-27 DIAGNOSIS — M47817 Spondylosis without myelopathy or radiculopathy, lumbosacral region: Secondary | ICD-10-CM

## 2018-09-27 DIAGNOSIS — M170 Bilateral primary osteoarthritis of knee: Secondary | ICD-10-CM

## 2018-09-28 ENCOUNTER — Other Ambulatory Visit: Payer: Self-pay | Admitting: Internal Medicine

## 2018-09-28 MED ORDER — HYDROCODONE-ACETAMINOPHEN 5-325 MG PO TABS: 1.0000 | ORAL_TABLET | Freq: Four times a day (QID) | ORAL | 0 refills | Status: DC | PRN

## 2018-10-12 ENCOUNTER — Ambulatory Visit (INDEPENDENT_AMBULATORY_CARE_PROVIDER_SITE_OTHER): Payer: PPO | Admitting: Internal Medicine

## 2018-10-12 ENCOUNTER — Encounter: Payer: Self-pay | Admitting: Internal Medicine

## 2018-10-12 VITALS — BP 148/76 | HR 51 | Ht 71.0 in | Wt 226.0 lb

## 2018-10-12 DIAGNOSIS — E039 Hypothyroidism, unspecified: Secondary | ICD-10-CM

## 2018-10-12 DIAGNOSIS — I10 Essential (primary) hypertension: Secondary | ICD-10-CM | POA: Diagnosis not present

## 2018-10-12 MED ORDER — TELMISARTAN-HCTZ 80-12.5 MG PO TABS: 1.0000 | ORAL_TABLET | Freq: Every day | ORAL | 0 refills | Status: DC

## 2018-10-12 NOTE — Progress Notes (Signed)
Virtual Visit via Video Note  I connected with Claudia [name] on 10/12/18 at 11:00 AM EDT by a video enabled telemedicine application and verified that I am speaking with the correct person using two identifiers.   I discussed the limitations of evaluation and management by telemedicine and the availability of in person appointments. The patient expressed understanding and agreed to proceed.  History of Present Illness: She checked in for virtual visit.  She was not willing to come in because of the COVID-19 pandemic.  She tells me she has been feeling at her baseline recently.  She continues to have pain in her large joints.  She is getting adequate symptom relief with a combination of hydrocodone and acetaminophen.  She tells me the constipation has responded well to Linzess.  She feels like her thyroid dose is adequate and she denies edema, weight changes, changes in her sleep, palpitations, nervousness, or or anxiety.  She tells me her blood pressure has been well controlled recently.  She denies dizziness, lightheadedness, palpitations, or near syncope.    Observations/Objective: On the video cam she was in no acute distress.  She was calm, cooperative, and appropriate.  Lab Results  Component Value Date   WBC 4.5 02/20/2018   HGB 13.1 02/20/2018   HCT 39.5 02/20/2018   PLT 208.0 02/20/2018   GLUCOSE 96 06/13/2018   CHOL 223 (H) 02/20/2018   TRIG 76.0 02/20/2018   HDL 61.40 02/20/2018   LDLDIRECT 158.9 09/05/2012   LDLCALC 146 (H) 02/20/2018   ALT 8 02/20/2018   AST 12 02/20/2018   NA 141 06/13/2018   K 4.4 06/13/2018   CL 107 06/13/2018   CREATININE 0.82 06/13/2018   BUN 18 06/13/2018   CO2 26 06/13/2018   TSH 4.83 (H) 06/13/2018   INR 1.00 04/04/2017   HGBA1C 5.8 (H) 08/24/2015     Assessment and Plan: Her last TSH was 4.83 about 4 months ago.  Based on her symptoms she sounds like she is euthyroid.  She agrees to come in in the next few weeks to have her TSH level  checked.  If it is above 10 then I will consider thyroid replacement therapy.  She is tolerating the bradycardia well with no recent episodes of dizziness, lightheadedness, or near syncope.  Her blood pressure is relatively well controlled.  I will also monitor her electrolytes and renal function.   Follow Up Instructions: She agrees to come in for the lab work as recommended.  She will continue her current medications.  I sent in a refill for the ARB and thiazide diuretic.  She will let me know if she develops any new or worsening symptoms.    I discussed the assessment and treatment plan with the patient. The patient was provided an opportunity to ask questions and all were answered. The patient agreed with the plan and demonstrated an understanding of the instructions.   The patient was advised to call back or seek an in-person evaluation if the symptoms worsen or if the condition fails to improve as anticipated.  I provided 25 minutes of non-face-to-face time during this encounter.   [name], MD

## 2018-10-20 ENCOUNTER — Other Ambulatory Visit (INDEPENDENT_AMBULATORY_CARE_PROVIDER_SITE_OTHER): Payer: PPO

## 2018-10-20 DIAGNOSIS — I10 Essential (primary) hypertension: Secondary | ICD-10-CM | POA: Diagnosis not present

## 2018-10-20 DIAGNOSIS — E039 Hypothyroidism, unspecified: Secondary | ICD-10-CM

## 2018-10-20 LAB — BASIC METABOLIC PANEL
BUN: 18 mg/dL (ref 6–23)
CO2: 27 mEq/L (ref 19–32)
Calcium: 9.8 mg/dL (ref 8.4–10.5)
Chloride: 107 mEq/L (ref 96–112)
Creatinine, Ser: 0.88 mg/dL (ref 0.40–1.20)
GFR: 74.32 mL/min (ref >60.00)
Glucose, Bld: 81 mg/dL (ref 70–99)
Potassium: 3.9 mEq/L (ref 3.5–5.1)
Sodium: 142 mEq/L (ref 135–145)

## 2018-10-20 LAB — TSH: TSH: 4.52 u[IU]/mL — ABNORMAL HIGH (ref 0.35–4.50)

## 2018-10-30 ENCOUNTER — Other Ambulatory Visit: Payer: Self-pay | Admitting: Internal Medicine

## 2018-10-30 ENCOUNTER — Telehealth: Payer: Self-pay | Admitting: Internal Medicine

## 2018-10-30 DIAGNOSIS — M47817 Spondylosis without myelopathy or radiculopathy, lumbosacral region: Secondary | ICD-10-CM

## 2018-10-30 DIAGNOSIS — M170 Bilateral primary osteoarthritis of knee: Secondary | ICD-10-CM

## 2018-10-30 DIAGNOSIS — M961 Postlaminectomy syndrome, not elsewhere classified: Secondary | ICD-10-CM

## 2018-10-30 MED ORDER — HYDROCODONE-ACETAMINOPHEN 5-325 MG PO TABS: 1.0000 | ORAL_TABLET | Freq: Four times a day (QID) | ORAL | 0 refills | Status: DC | PRN

## 2018-10-30 NOTE — Telephone Encounter (Signed)
Per database, hydrocodone was last filled on 09/28/2018

## 2018-10-30 NOTE — Telephone Encounter (Signed)
Copied from [place] [id number]. Topic: Quick Communication - Rx Refill/Question >> Oct 30, 2018 12:04 PM Virl Axe D wrote: Medication: HYDROcodone-acetaminophen (NORCO/VICODIN) 5-325 MG tablet  Has the patient contacted their pharmacy? No. (Agent: If no, request that the patient contact the pharmacy for the refill.) (Agent: If yes, when and what did the pharmacy advise?)  Preferred Pharmacy (with phone number or street name): [facility] [phone] - [place], [place] - [address] AT [address] [phone] (Phone) [phone] (Fax)    Agent: Please be advised that RX refills may take up to 3 business days. We ask that you follow-up with your pharmacy.

## 2018-10-31 DIAGNOSIS — Z961 Presence of intraocular lens: Secondary | ICD-10-CM | POA: Diagnosis not present

## 2018-11-30 ENCOUNTER — Telehealth: Payer: Self-pay | Admitting: Internal Medicine

## 2018-11-30 ENCOUNTER — Other Ambulatory Visit: Payer: Self-pay | Admitting: Internal Medicine

## 2018-11-30 DIAGNOSIS — M170 Bilateral primary osteoarthritis of knee: Secondary | ICD-10-CM

## 2018-11-30 DIAGNOSIS — M961 Postlaminectomy syndrome, not elsewhere classified: Secondary | ICD-10-CM

## 2018-11-30 DIAGNOSIS — M47817 Spondylosis without myelopathy or radiculopathy, lumbosacral region: Secondary | ICD-10-CM

## 2018-11-30 MED ORDER — HYDROCODONE-ACETAMINOPHEN 5-325 MG PO TABS: 1.0000 | ORAL_TABLET | Freq: Four times a day (QID) | ORAL | 0 refills | Status: DC | PRN

## 2018-11-30 NOTE — Telephone Encounter (Signed)
Copied from [place] [phone]. Topic: Quick Communication - Rx Refill/Question >> Nov 30, 2018  2:27 PM Virl Axe D wrote: Medication: HYDROcodone-acetaminophen (NORCO/VICODIN) 5-325 MG tablet  Has the patient contacted their pharmacy? Yes.   (Agent: If no, request that the patient contact the pharmacy for the refill.) (Agent: If yes, when and what did the pharmacy advise?)  Preferred Pharmacy (with phone number or street name): [facility] [phone] - [place], [place] - [address] [phone] (Phone) [phone] (Fax)    Agent: Please be advised that RX refills may take up to 3 business days. We ask that you follow-up with your pharmacy.

## 2018-12-04 ENCOUNTER — Encounter: Payer: Self-pay | Admitting: Internal Medicine

## 2018-12-04 ENCOUNTER — Ambulatory Visit (INDEPENDENT_AMBULATORY_CARE_PROVIDER_SITE_OTHER)
Admission: RE | Admit: 2018-12-04 | Discharge: 2018-12-04 | Disposition: A | Discharge status: Home / Self Care | Payer: PPO | Source: Ambulatory Visit | Attending: Internal Medicine | Admitting: Internal Medicine

## 2018-12-04 ENCOUNTER — Other Ambulatory Visit (INDEPENDENT_AMBULATORY_CARE_PROVIDER_SITE_OTHER): Payer: PPO

## 2018-12-04 ENCOUNTER — Ambulatory Visit (INDEPENDENT_AMBULATORY_CARE_PROVIDER_SITE_OTHER): Payer: PPO | Admitting: Internal Medicine

## 2018-12-04 ENCOUNTER — Other Ambulatory Visit: Payer: Self-pay

## 2018-12-04 VITALS — BP 150/70 | HR 59 | Temp 98.7°F | Resp 16 | Ht 71.0 in | Wt 226.0 lb

## 2018-12-04 DIAGNOSIS — I10 Essential (primary) hypertension: Secondary | ICD-10-CM | POA: Diagnosis not present

## 2018-12-04 DIAGNOSIS — R079 Chest pain, unspecified: Secondary | ICD-10-CM | POA: Diagnosis not present

## 2018-12-04 DIAGNOSIS — E039 Hypothyroidism, unspecified: Secondary | ICD-10-CM | POA: Diagnosis not present

## 2018-12-04 LAB — CBC WITH DIFFERENTIAL/PLATELET
Basophils Absolute: 0 10*3/uL (ref 0.0–0.1)
Basophils Relative: 0.5 % (ref 0.0–3.0)
Eosinophils Absolute: 0.2 10*3/uL (ref 0.0–0.7)
Eosinophils Relative: 5.1 % — ABNORMAL HIGH (ref 0.0–5.0)
HCT: 39.7 % (ref 36.0–46.0)
Hemoglobin: 12.9 g/dL (ref 12.0–15.0)
Lymphocytes Relative: 32.6 % (ref 12.0–46.0)
Lymphs Abs: 1.6 10*3/uL (ref 0.7–4.0)
MCHC: 32.5 g/dL (ref 30.0–36.0)
MCV: 88 fl (ref 78.0–100.0)
Monocytes Absolute: 0.4 10*3/uL (ref 0.1–1.0)
Monocytes Relative: 8.7 % (ref 3.0–12.0)
Neutro Abs: 2.5 10*3/uL (ref 1.4–7.7)
Neutrophils Relative %: 53.1 % (ref 43.0–77.0)
Platelets: 207 10*3/uL (ref 150.0–400.0)
RBC: 4.51 Mil/uL (ref 3.87–5.11)
RDW: 13.8 % (ref 11.5–15.5)
WBC: 4.8 10*3/uL (ref 4.0–10.5)

## 2018-12-04 LAB — BASIC METABOLIC PANEL
BUN: 23 mg/dL (ref 6–23)
CO2: 27 mEq/L (ref 19–32)
Calcium: 9.9 mg/dL (ref 8.4–10.5)
Chloride: 107 mEq/L (ref 96–112)
Creatinine, Ser: 0.88 mg/dL (ref 0.40–1.20)
GFR: 74.3 mL/min (ref >60.00)
Glucose, Bld: 95 mg/dL (ref 70–99)
Potassium: 4.3 mEq/L (ref 3.5–5.1)
Sodium: 141 mEq/L (ref 135–145)

## 2018-12-04 LAB — BRAIN NATRIURETIC PEPTIDE: Pro B Natriuretic peptide (BNP): 143 pg/mL — ABNORMAL HIGH (ref 0.0–100.0)

## 2018-12-04 LAB — TSH: TSH: 3.94 u[IU]/mL (ref 0.35–4.50)

## 2018-12-04 LAB — TROPONIN I: TNIDX: 0.02 ug/l (ref 0.00–0.06)

## 2018-12-04 NOTE — Patient Instructions (Signed)

## 2018-12-04 NOTE — Progress Notes (Signed)
Subjective:  Patient ID: [name], female    DOB: 11-22-1933  Age: 85 y.o. MRN: [id number]  CC: Chest Pain; Hypertension; and Hypothyroidism   HPI KAILIA STARRY presents for f/up - She complains of a 2-week history of nonexertional chest pain that she describes as a dull ache in the precordial region.  The last time she felt the chest pain was 4 days ago.  She is not aware of any worsening or mitigating factors.  She has also had a few episodes of diaphoresis.  She denies palpitations, dizziness, lightheadedness, or peripheral edema.  Outpatient Medications Prior to Visit  Medication Sig Dispense Refill  . cetirizine (ZYRTEC) 10 MG tablet Take 1 tablet (10 mg total) by mouth daily. 90 tablet 1  . HYDROcodone-acetaminophen (NORCO/VICODIN) 5-325 MG tablet Take 1 tablet by mouth every 6 (six) hours as needed for moderate pain. 90 tablet 0  . linaclotide (LINZESS) 290 MCG CAPS capsule Take 1 capsule (290 mcg total) by mouth daily before breakfast. 90 capsule 1  . telmisartan-hydrochlorothiazide (MICARDIS HCT) 80-12.5 MG tablet Take 1 tablet by mouth daily. 90 tablet 0  . vitamin C (ASCORBIC ACID) 500 MG tablet Take 500 mg by mouth daily.    . vitamin E 400 UNIT capsule Take 400 Units by mouth daily.     No facility-administered medications prior to visit.     ROS Review of Systems  Constitutional: Positive for diaphoresis. Negative for appetite change, fatigue and unexpected weight change.  HENT: Negative.   Eyes: Negative.   Respiratory: Negative for cough, chest tightness, shortness of breath and wheezing.   Cardiovascular: Positive for chest pain. Negative for palpitations and leg swelling.  Gastrointestinal: Negative for abdominal pain, constipation, diarrhea, nausea and vomiting.  Endocrine: Negative.   Genitourinary: Negative.  Negative for difficulty urinating.  Musculoskeletal: Positive for arthralgias. Negative for back pain and myalgias.  Skin: Negative.   Negative for color change.  Neurological: Negative.  Negative for dizziness, weakness and light-headedness.  Hematological: Negative for adenopathy. Does not bruise/bleed easily.  Psychiatric/Behavioral: Negative.     Objective:  BP (!) 150/70 (BP Location: Left Arm, Patient Position: Sitting, Cuff Size: Large)   Pulse (!) 59   Temp 98.7 F (37.1 C) (Oral)   Resp 16   Ht 5\' 11"  (1.803 m)   Wt 226 lb (102.5 kg)   SpO2 98%   BMI 31.52 kg/m   BP Readings from Last 3 Encounters:  12/04/18 (!) 150/70  10/12/18 (!) 148/76  06/13/18 140/70    Wt Readings from Last 3 Encounters:  12/04/18 226 lb (102.5 kg)  10/12/18 226 lb (102.5 kg)  06/13/18 232 lb (105.2 kg)    Physical Exam Constitutional:      Appearance: She is obese. She is not ill-appearing or diaphoretic.  HENT:     Nose: Nose normal.     Mouth/Throat:     Mouth: Mucous membranes are moist.  Eyes:     General: No scleral icterus.    Conjunctiva/sclera: Conjunctivae normal.  Neck:     Musculoskeletal: Normal range of motion. No neck rigidity.  Cardiovascular:     Rate and Rhythm: Regular rhythm. Bradycardia present.     Heart sounds: No murmur.     Comments: EKG ----  Sinus  Bradycardia  -  Nonspecific T-abnormality.   ABNORMAL - no change from the prior EKG Pulmonary:     Effort: Pulmonary effort is normal.     Breath sounds: No stridor. No wheezing,  rhonchi or rales.  Abdominal:     General: Abdomen is flat. Bowel sounds are normal.     Palpations: There is no hepatomegaly or splenomegaly.     Tenderness: There is no abdominal tenderness.  Musculoskeletal: Normal range of motion.     Right lower leg: No edema.     Left lower leg: No edema.  Lymphadenopathy:     Cervical: No cervical adenopathy.  Skin:    General: Skin is warm and dry.  Neurological:     General: No focal deficit present.     Mental Status: She is alert. Mental status is at baseline.  Psychiatric:        Mood and Affect: Mood  normal.        Behavior: Behavior normal.     Lab Results  Component Value Date   WBC 4.8 12/04/2018   HGB 12.9 12/04/2018   HCT 39.7 12/04/2018   PLT 207.0 12/04/2018   GLUCOSE 95 12/04/2018   CHOL 223 (H) 02/20/2018   TRIG 76.0 02/20/2018   HDL 61.40 02/20/2018   LDLDIRECT 158.9 09/05/2012   LDLCALC 146 (H) 02/20/2018   ALT 8 02/20/2018   AST 12 02/20/2018   NA 141 12/04/2018   K 4.3 12/04/2018   CL 107 12/04/2018   CREATININE 0.88 12/04/2018   BUN 23 12/04/2018   CO2 27 12/04/2018   TSH 3.94 12/04/2018   INR 1.00 04/04/2017   HGBA1C 5.8 (H) 08/24/2015    Ct Head Wo Contrast  Result Date: 04/04/2017 CLINICAL DATA:  Dizziness for 4 days. EXAM: CT HEAD WITHOUT CONTRAST TECHNIQUE: Contiguous axial images were obtained from the base of the skull through the vertex without intravenous contrast. COMPARISON:  Head CT scan 12/07/2013. FINDINGS: Brain: Appears normal without hemorrhage, infarct, mass lesion, mass effect, midline shift or abnormal extra-axial fluid collection. No hydrocephalus or pneumocephalus. Vascular: Atherosclerosis noted. Skull: Intact. Sinuses/Orbits: Negative. Other: None. IMPRESSION: No acute abnormality. Atherosclerosis. Electronically Signed   By: Inge Rise M.D.   On: 04/04/2017 15:55   Mr Brain Wo Contrast  Result Date: 04/04/2017 CLINICAL DATA:  Initial evaluation for focal neuro deficits. EXAM: MRI HEAD WITHOUT CONTRAST TECHNIQUE: Multiplanar, multiecho pulse sequences of the brain and surrounding structures were obtained without intravenous contrast. COMPARISON:  Prior CT from 04/04/2017. FINDINGS: Brain: Diffuse prominence of the CSF containing spaces compatible with generalized cerebral atrophy. Mild chronic microvascular ischemic changes present within the periventricular white matter. No abnormal foci of restricted diffusion to suggest acute or subacute ischemia. Gray-white matter differentiation maintained. No encephalomalacia to suggest chronic  infarction. No susceptibility artifact to suggest acute or chronic intracranial hemorrhage. 16 mm meningioma overlies the right frontal convexity without significant mass effect (series 7, image 21). No other mass lesion. No midline shift or mass effect. No hydrocephalus. No extra-axial fluid collection. Major dural sinuses are grossly patent. Pituitary gland suprasellar region normal. Midline structures intact and within normal limits. Vascular: Major intracranial vascular flow voids are maintained. Skull and upper cervical spine: Craniocervical junction within normal limits. Degenerative spondylolysis noted at C3-4 with resultant mild spinal stenosis. Remainder the visualized upper cervical spine otherwise unremarkable. Bone marrow signal intensity within normal limits. No scalp soft tissue abnormality. Sinuses/Orbits: Globes and orbital soft tissues within normal limits. Paranasal sinuses are largely clear. No mastoid effusion. Inner ear structures normal. Other: None. IMPRESSION: 1. No acute intracranial infarct or other abnormality identified. 2. Mild for age chronic microvascular ischemic disease. 3. 16 mm meningioma overlying the right frontal convexity  without associated mass effect. Electronically Signed   By: Jeannine Boga M.D.   On: 04/04/2017 20:19    Dg Chest 2 View  Result Date: 12/04/2018 CLINICAL DATA:  Chest pain. EXAM: CHEST - 2 VIEW COMPARISON:  May 06, 2016 FINDINGS: The heart size and mediastinal contours are within normal limits. Both lungs are clear. The visualized skeletal structures are unremarkable. IMPRESSION: No active cardiopulmonary disease. Electronically Signed   By: Dorise Bullion III M.D   On: 12/04/2018 14:21    Assessment & Plan:   [place] was seen today for chest pain, hypertension and hypothyroidism.  Diagnoses and all orders for this visit:  Acquired hypothyroidism- Her TSH is in the normal range.  She will remain on the current dose of levothyroxine.  -     Basic metabolic panel; Future  Essential hypertension, benign- Her blood pressure is well controlled considering her age.  Electrolytes and renal function are normal. -     TSH; Future -     CBC with Differential/Platelet; Future  Chest pain, unspecified type- Her EKG is unchanged compared to her prior EKG.  Her BNP is lower than it was previously.  Her d-dimer is not elevated compared to her previous d-dimer.  Troponin is negative and her chest x-ray is normal.  She has had no more chest pain for 4 days.  I think her chest pain is atypical and probably musculoskeletal.  I do not think there is any further work-up needed at this time.  She will let me know if she develops any new or worsening symptoms. -     EKG 12-Lead -     Brain natriuretic peptide; Future -     Troponin I -; Future -     D-dimer, quantitative (not at [facility]); Future -     DG Chest 2 View; Future   I am having Lurlene Ronda. [place] maintain her vitamin C, vitamin E, cetirizine, linaclotide, telmisartan-hydrochlorothiazide, and HYDROcodone-acetaminophen.  No orders of the defined types were placed in this encounter.    Follow-up: Return in about 3 weeks (around 12/25/2018).  [name], MD

## 2018-12-05 ENCOUNTER — Encounter: Payer: Self-pay | Admitting: Internal Medicine

## 2018-12-05 LAB — D-DIMER, QUANTITATIVE: D-Dimer, Quant: 1.37 mcg/mL FEU — ABNORMAL HIGH (ref <0.50)

## 2018-12-25 ENCOUNTER — Other Ambulatory Visit: Payer: Self-pay

## 2018-12-25 ENCOUNTER — Encounter: Payer: Self-pay | Admitting: Internal Medicine

## 2018-12-25 ENCOUNTER — Ambulatory Visit (INDEPENDENT_AMBULATORY_CARE_PROVIDER_SITE_OTHER)
Admission: RE | Admit: 2018-12-25 | Discharge: 2018-12-25 | Disposition: A | Discharge status: Home / Self Care | Payer: PPO | Source: Ambulatory Visit | Attending: Internal Medicine | Admitting: Internal Medicine

## 2018-12-25 ENCOUNTER — Ambulatory Visit (INDEPENDENT_AMBULATORY_CARE_PROVIDER_SITE_OTHER): Payer: PPO | Admitting: Internal Medicine

## 2018-12-25 VITALS — BP 146/72 | HR 50 | Temp 97.9°F | Resp 16 | Ht 71.0 in | Wt 224.0 lb

## 2018-12-25 DIAGNOSIS — G8929 Other chronic pain: Secondary | ICD-10-CM

## 2018-12-25 DIAGNOSIS — I10 Essential (primary) hypertension: Secondary | ICD-10-CM

## 2018-12-25 DIAGNOSIS — M545 Low back pain, unspecified: Secondary | ICD-10-CM

## 2018-12-25 DIAGNOSIS — E039 Hypothyroidism, unspecified: Secondary | ICD-10-CM | POA: Diagnosis not present

## 2018-12-25 NOTE — Patient Instructions (Signed)

## 2018-12-25 NOTE — Progress Notes (Signed)
Subjective:  Patient ID: Claudia [name], female    DOB: May 31, 1934  Age: 85 y.o. MRN: [id number]  CC: Hypertension and Back Pain   HPI Claudia [name] presents for f/up - She complains that over the last few months she has had a slight worsening in her left lower back pain.  The back pain does not radiate into her lower extremities and she denies paresthesias.  She is controlling the pain with hydrocodone and acetaminophen.  Outpatient Medications Prior to Visit  Medication Sig Dispense Refill  . cetirizine (ZYRTEC) 10 MG tablet Take 1 tablet (10 mg total) by mouth daily. 90 tablet 1  . HYDROcodone-acetaminophen (NORCO/VICODIN) 5-325 MG tablet Take 1 tablet by mouth every 6 (six) hours as needed for moderate pain. 90 tablet 0  . linaclotide (LINZESS) 290 MCG CAPS capsule Take 1 capsule (290 mcg total) by mouth daily before breakfast. 90 capsule 1  . telmisartan-hydrochlorothiazide (MICARDIS HCT) 80-12.5 MG tablet Take 1 tablet by mouth daily. 90 tablet 0  . vitamin C (ASCORBIC ACID) 500 MG tablet Take 500 mg by mouth daily.    . vitamin E 400 UNIT capsule Take 400 Units by mouth daily.     No facility-administered medications prior to visit.     ROS Review of Systems  Constitutional: Negative.  Negative for diaphoresis and fatigue.  HENT: Negative.   Eyes: Negative for visual disturbance.  Respiratory: Negative for cough, chest tightness, shortness of breath and wheezing.   Cardiovascular: Negative for chest pain, palpitations and leg swelling.  Gastrointestinal: Negative for abdominal pain, constipation, diarrhea and vomiting.  Endocrine: Negative.   Genitourinary: Negative.  Negative for difficulty urinating.  Musculoskeletal: Positive for arthralgias and back pain. Negative for gait problem, joint swelling and myalgias.  Skin: Negative.   Neurological: Negative for dizziness, weakness and light-headedness.  Hematological: Negative for adenopathy. Does not bruise/bleed  easily.  Psychiatric/Behavioral: Negative.     Objective:  BP (!) 146/72 (BP Location: Left Arm, Patient Position: Sitting, Cuff Size: Normal)   Pulse (!) 50   Temp 97.9 F (36.6 C) (Oral)   Resp 16   Ht 5\' 11"  (1.803 m)   Wt 224 lb (101.6 kg)   SpO2 98%   BMI 31.24 kg/m   BP Readings from Last 3 Encounters:  12/25/18 (!) 146/72  12/04/18 (!) 150/70  10/12/18 (!) 148/76    Wt Readings from Last 3 Encounters:  12/25/18 224 lb (101.6 kg)  12/04/18 226 lb (102.5 kg)  10/12/18 226 lb (102.5 kg)    Physical Exam Vitals signs reviewed.  Constitutional:      Appearance: She is obese. She is not ill-appearing or diaphoretic.  HENT:     Nose: Nose normal.     Mouth/Throat:     Mouth: Mucous membranes are moist.     Pharynx: No oropharyngeal exudate.  Eyes:     General: No scleral icterus.    Conjunctiva/sclera: Conjunctivae normal.  Neck:     Musculoskeletal: Normal range of motion. No neck rigidity.  Cardiovascular:     Rate and Rhythm: Regular rhythm. Bradycardia present.     Heart sounds: Normal heart sounds, S1 normal and S2 normal. No murmur. No gallop.   Pulmonary:     Effort: Pulmonary effort is normal.     Breath sounds: No stridor. No wheezing, rhonchi or rales.  Abdominal:     General: Abdomen is protuberant. Bowel sounds are normal. There is no distension.     Palpations: Abdomen is  soft.     Tenderness: There is no abdominal tenderness.  Musculoskeletal: Normal range of motion.     Right lower leg: No edema.     Left lower leg: No edema.  Lymphadenopathy:     Cervical: No cervical adenopathy.  Skin:    General: Skin is warm and dry.  Neurological:     General: No focal deficit present.     Mental Status: She is oriented to person, place, and time. Mental status is at baseline.     Cranial Nerves: Cranial nerves are intact.     Motor: Motor function is intact. No weakness or tremor.     Coordination: Coordination is intact. Coordination normal.      Comments: Neg SLR in BLE  Psychiatric:        Mood and Affect: Mood normal.     Lab Results  Component Value Date   WBC 4.8 12/04/2018   HGB 12.9 12/04/2018   HCT 39.7 12/04/2018   PLT 207.0 12/04/2018   GLUCOSE 95 12/04/2018   CHOL 223 (H) 02/20/2018   TRIG 76.0 02/20/2018   HDL 61.40 02/20/2018   LDLDIRECT 158.9 09/05/2012   LDLCALC 146 (H) 02/20/2018   ALT 8 02/20/2018   AST 12 02/20/2018   NA 141 12/04/2018   K 4.3 12/04/2018   CL 107 12/04/2018   CREATININE 0.88 12/04/2018   BUN 23 12/04/2018   CO2 27 12/04/2018   TSH 3.94 12/04/2018   INR 1.00 04/04/2017   HGBA1C 5.8 (H) 08/24/2015    Dg Chest 2 View  Result Date: 12/04/2018 CLINICAL DATA:  Chest pain. EXAM: CHEST - 2 VIEW COMPARISON:  May 06, 2016 FINDINGS: The heart size and mediastinal contours are within normal limits. Both lungs are clear. The visualized skeletal structures are unremarkable. IMPRESSION: No active cardiopulmonary disease. Electronically Signed   By: Dorise Bullion III M.D   On: 12/04/2018 14:21   Dg Lumbar Spine Complete  Result Date: 12/25/2018 CLINICAL DATA:  Chronic low back pain for 2 weeks, no known injury, initial encounter EXAM: LUMBAR SPINE - COMPLETE 4+ VIEW COMPARISON:  05/03/2011 FINDINGS: There are changes could consistent with prior interbody fusion at L4-5 with posterior fixation and pedicle screws. The overall appearance is stable from the prior exam. Mild scoliosis of the mid lumbar spine concave to the right is again seen and stable. Mild disc space narrowing at L2-3 with associated osteophytic changes are seen and stable. No soft tissue abnormality is noted. IMPRESSION: Postoperative and degenerative changes without acute abnormality. Electronically Signed   By: Inez Catalina M.D.   On: 12/25/2018 10:57     Assessment & Plan:   Claudia [name] was seen today for hypertension and back pain.  Diagnoses and all orders for this visit:  Chronic left-sided low back pain without  sciatica- Based on her symptoms, exam, and plain films there has been no complication related to her prior lumbar spine surgeries.  There is also no evidence of radiculopathy.  Will continue to control the pain with hydrocodone and acetaminophen as needed. -     DG Lumbar Spine Complete; Future  Essential hypertension, benign- Her blood pressure is adequately well controlled.  Acquired hypothyroidism- Her TSH is in the normal range.  She will remain on the current dose of levothyroxine.   I have discontinued Victoriya Pol. Bomberger's vitamin E. I am also having her maintain her vitamin C, cetirizine, linaclotide, telmisartan-hydrochlorothiazide, and HYDROcodone-acetaminophen.  No orders of the defined types were placed in this  encounter.    Follow-up: Return in about 4 months (around 04/26/2019).  [name], MD

## 2018-12-28 ENCOUNTER — Telehealth: Payer: Self-pay | Admitting: Internal Medicine

## 2018-12-28 ENCOUNTER — Other Ambulatory Visit: Payer: Self-pay | Admitting: Internal Medicine

## 2018-12-28 DIAGNOSIS — M47817 Spondylosis without myelopathy or radiculopathy, lumbosacral region: Secondary | ICD-10-CM

## 2018-12-28 DIAGNOSIS — M961 Postlaminectomy syndrome, not elsewhere classified: Secondary | ICD-10-CM

## 2018-12-28 DIAGNOSIS — M170 Bilateral primary osteoarthritis of knee: Secondary | ICD-10-CM

## 2018-12-28 MED ORDER — HYDROCODONE-ACETAMINOPHEN 5-325 MG PO TABS: 1.0000 | ORAL_TABLET | Freq: Four times a day (QID) | ORAL | 0 refills | Status: DC | PRN

## 2018-12-28 NOTE — Telephone Encounter (Signed)
Medication Refill - Medication: HYDROcodone-acetaminophen (NORCO/VICODIN) 5-325 MG tablet  Has the patient contacted their pharmacy? No. (Agent: If no, request that the patient contact the pharmacy for the refill.) (Agent: If yes, when and what did the pharmacy advise?)  Preferred Pharmacy (with phone number or street name):  [facility] [phone] - [place], [place] - [address] AT [address] [phone] (Phone) [phone] (Fax)     Agent: Please be advised that RX refills may take up to 3 business days. We ask that you follow-up with your pharmacy.

## 2019-01-29 ENCOUNTER — Telehealth: Payer: Self-pay | Admitting: Internal Medicine

## 2019-01-29 NOTE — Telephone Encounter (Signed)
Medication Refill - Medication: HYDROcodone-acetaminophen (NORCO/VICODIN) 5-325 MG tablet   Has the patient contacted their pharmacy? Yes.   (Agent: If no, request that the patient contact the pharmacy for the refill.) (Agent: If yes, when and what did the pharmacy advise?)call PCP office   Preferred Pharmacy (with phone number or street name):  [facility] [phone] - [place], [place] - [address] AT [address] [phone] (Phone) [phone] (Fax)     Agent: Please be advised that RX refills may take up to 3 business days. We ask that you follow-up with your pharmacy.

## 2019-01-30 ENCOUNTER — Other Ambulatory Visit: Payer: Self-pay | Admitting: Internal Medicine

## 2019-01-30 DIAGNOSIS — M170 Bilateral primary osteoarthritis of knee: Secondary | ICD-10-CM

## 2019-01-30 DIAGNOSIS — M961 Postlaminectomy syndrome, not elsewhere classified: Secondary | ICD-10-CM

## 2019-01-30 DIAGNOSIS — M47817 Spondylosis without myelopathy or radiculopathy, lumbosacral region: Secondary | ICD-10-CM

## 2019-01-30 MED ORDER — HYDROCODONE-ACETAMINOPHEN 5-325 MG PO TABS: 1.0000 | ORAL_TABLET | Freq: Four times a day (QID) | ORAL | 0 refills | Status: DC | PRN

## 2019-02-14 ENCOUNTER — Other Ambulatory Visit: Payer: Self-pay | Admitting: Internal Medicine

## 2019-02-14 DIAGNOSIS — K5904 Chronic idiopathic constipation: Secondary | ICD-10-CM

## 2019-02-14 DIAGNOSIS — I10 Essential (primary) hypertension: Secondary | ICD-10-CM

## 2019-02-14 MED ORDER — TELMISARTAN-HCTZ 80-12.5 MG PO TABS: 1.0000 | ORAL_TABLET | Freq: Every day | ORAL | 1 refills | Status: DC

## 2019-02-28 ENCOUNTER — Other Ambulatory Visit: Payer: Self-pay | Admitting: Internal Medicine

## 2019-02-28 DIAGNOSIS — M47817 Spondylosis without myelopathy or radiculopathy, lumbosacral region: Secondary | ICD-10-CM

## 2019-02-28 DIAGNOSIS — M170 Bilateral primary osteoarthritis of knee: Secondary | ICD-10-CM

## 2019-02-28 DIAGNOSIS — M961 Postlaminectomy syndrome, not elsewhere classified: Secondary | ICD-10-CM

## 2019-02-28 NOTE — Telephone Encounter (Signed)
Requested medication (s) are due for refill today: yes  Requested medication (s) are on the active medication list: yes  Last refill: 01/30/2019  Future visit scheduled: yes  Notes to clinic:  This refill cannot be delegated   Requested Prescriptions  Pending Prescriptions Disp Refills   HYDROcodone-acetaminophen (NORCO/VICODIN) 5-325 MG tablet 90 tablet 0    Sig: Take 1 tablet by mouth every 6 (six) hours as needed for moderate pain.     Not Delegated - Analgesics:  Opioid Agonist Combinations Failed - 02/28/2019  2:26 PM      Failed - This refill cannot be delegated      Failed - Urine Drug Screen completed in last 360 days.      Passed - Valid encounter within last 6 months    Recent Outpatient Visits          2 months ago Chronic left-sided low back pain without sciatica   [place], MD   2 months ago Acquired hypothyroidism   [place], Thomas L, MD   4 months ago Essential hypertension, benign   [place], Thomas L, MD   8 months ago Acquired hypothyroidism   [place], Thomas L, MD   1 year ago Other specified hypothyroidism   [place], MD      Future Appointments            In 1 month Ronnald Ramp Arvid Right, MD [place], [facility]

## 2019-02-28 NOTE — Telephone Encounter (Signed)
HYDROcodone-acetaminophen (NORCO/VICODIN) 5-325 MG tablet  Send to [facility]

## 2019-03-01 MED ORDER — HYDROCODONE-ACETAMINOPHEN 5-325 MG PO TABS: 1.0000 | ORAL_TABLET | Freq: Four times a day (QID) | ORAL | 0 refills | Status: DC | PRN

## 2019-03-30 ENCOUNTER — Ambulatory Visit (INDEPENDENT_AMBULATORY_CARE_PROVIDER_SITE_OTHER): Payer: PPO

## 2019-03-30 ENCOUNTER — Telehealth: Payer: Self-pay | Admitting: Internal Medicine

## 2019-03-30 ENCOUNTER — Other Ambulatory Visit: Payer: Self-pay

## 2019-03-30 DIAGNOSIS — Z23 Encounter for immunization: Secondary | ICD-10-CM | POA: Diagnosis not present

## 2019-03-30 NOTE — Telephone Encounter (Signed)
Per datebase, rx last filled on 03/01/2019. LOV with PCP was 12/25/2018

## 2019-03-30 NOTE — Telephone Encounter (Unsigned)
Copied from [place] [phone]. Topic: Quick Communication - Rx Refill/Question >> Mar 30, 2019 12:30 PM Yvette Rack wrote: Medication: HYDROcodone-acetaminophen (NORCO/VICODIN) 5-325 MG tablet  Has the patient contacted their pharmacy? no  Preferred Pharmacy (with phone number or street name): [facility] [phone] - [place], [place] - [address] AT [address] [phone] (Phone)   [phone] (Fax)  Agent: Please be advised that RX refills may take up to 3 business days. We ask that you follow-up with your pharmacy.

## 2019-04-01 ENCOUNTER — Other Ambulatory Visit: Payer: Self-pay | Admitting: Internal Medicine

## 2019-04-01 DIAGNOSIS — M47817 Spondylosis without myelopathy or radiculopathy, lumbosacral region: Secondary | ICD-10-CM

## 2019-04-01 DIAGNOSIS — M170 Bilateral primary osteoarthritis of knee: Secondary | ICD-10-CM

## 2019-04-01 DIAGNOSIS — M961 Postlaminectomy syndrome, not elsewhere classified: Secondary | ICD-10-CM

## 2019-04-01 MED ORDER — HYDROCODONE-ACETAMINOPHEN 5-325 MG PO TABS: 1.0000 | ORAL_TABLET | Freq: Four times a day (QID) | ORAL | 0 refills | Status: DC | PRN

## 2019-04-26 ENCOUNTER — Encounter: Payer: Self-pay | Admitting: Internal Medicine

## 2019-04-26 ENCOUNTER — Ambulatory Visit (INDEPENDENT_AMBULATORY_CARE_PROVIDER_SITE_OTHER): Payer: PPO | Admitting: Internal Medicine

## 2019-04-26 ENCOUNTER — Other Ambulatory Visit: Payer: Self-pay

## 2019-04-26 ENCOUNTER — Other Ambulatory Visit (INDEPENDENT_AMBULATORY_CARE_PROVIDER_SITE_OTHER): Payer: PPO

## 2019-04-26 VITALS — BP 142/78 | HR 60 | Temp 98.0°F | Resp 16 | Ht 71.0 in | Wt 232.0 lb

## 2019-04-26 DIAGNOSIS — Z0000 Encounter for general adult medical examination without abnormal findings: Secondary | ICD-10-CM

## 2019-04-26 DIAGNOSIS — I10 Essential (primary) hypertension: Secondary | ICD-10-CM

## 2019-04-26 DIAGNOSIS — E039 Hypothyroidism, unspecified: Secondary | ICD-10-CM

## 2019-04-26 DIAGNOSIS — E7800 Pure hypercholesterolemia, unspecified: Secondary | ICD-10-CM | POA: Diagnosis not present

## 2019-04-26 LAB — BASIC METABOLIC PANEL
BUN: 19 mg/dL (ref 6–23)
CO2: 28 mEq/L (ref 19–32)
Calcium: 9.6 mg/dL (ref 8.4–10.5)
Chloride: 107 mEq/L (ref 96–112)
Creatinine, Ser: 0.9 mg/dL (ref 0.40–1.20)
GFR: 72.32 mL/min (ref >60.00)
Glucose, Bld: 95 mg/dL (ref 70–99)
Potassium: 4.1 mEq/L (ref 3.5–5.1)
Sodium: 140 mEq/L (ref 135–145)

## 2019-04-26 LAB — LIPID PANEL
Cholesterol: 229 mg/dL — ABNORMAL HIGH (ref 0–200)
HDL: 56 mg/dL (ref >39.00)
LDL Cholesterol: 155 mg/dL — ABNORMAL HIGH (ref 0–99)
NonHDL: 173.48
Total CHOL/HDL Ratio: 4
Triglycerides: 90 mg/dL (ref 0.0–149.0)
VLDL: 18 mg/dL (ref 0.0–40.0)

## 2019-04-26 NOTE — Progress Notes (Signed)
Subjective:  Patient ID: Claudia [name], female    DOB: 1934-01-19  Age: 85 y.o. MRN: [id number]  CC: Hypertension, Hypothyroidism, and Hyperlipidemia   HPI GRAYSEN [name] presents for f/up - Other than weight gain she offers no new or different complaints today.  Her constipation is adequately treated with Linzess.  Her pain is adequately well controlled with hydrocodone and acetaminophen.  Outpatient Medications Prior to Visit  Medication Sig Dispense Refill  . cetirizine (ZYRTEC) 10 MG tablet Take 1 tablet (10 mg total) by mouth daily. 90 tablet 1  . HYDROcodone-acetaminophen (NORCO/VICODIN) 5-325 MG tablet Take 1 tablet by mouth every 6 (six) hours as needed for moderate pain. 90 tablet 0  . LINZESS 290 MCG CAPS capsule TAKE 1 CAPSULE BY MOUTH DAILY BEFORE BREAKFAST 90 capsule 1  . telmisartan-hydrochlorothiazide (MICARDIS HCT) 80-12.5 MG tablet Take 1 tablet by mouth daily. 90 tablet 1  . vitamin C (ASCORBIC ACID) 500 MG tablet Take 500 mg by mouth daily.     No facility-administered medications prior to visit.     ROS Review of Systems  Constitutional: Positive for unexpected weight change. Negative for diaphoresis and fatigue.  Eyes: Negative.   Respiratory: Negative for cough, chest tightness, shortness of breath and wheezing.   Cardiovascular: Negative for chest pain, palpitations and leg swelling.  Gastrointestinal: Positive for constipation. Negative for abdominal pain, diarrhea, nausea and vomiting.  Endocrine: Negative for cold intolerance and heat intolerance.  Genitourinary: Negative.  Negative for difficulty urinating.  Musculoskeletal: Positive for arthralgias.  Skin: Negative.  Negative for color change and pallor.  Neurological: Negative.  Negative for dizziness, weakness and light-headedness.  Hematological: Negative for adenopathy. Does not bruise/bleed easily.  Psychiatric/Behavioral: Negative.     Objective:  BP (!) 142/78 (BP Location: Left Arm,  Patient Position: Sitting, Cuff Size: Normal)   Pulse 60   Temp 98 F (36.7 C) (Oral)   Resp 16   Ht 5\' 11"  (1.803 m)   Wt 232 lb (105.2 kg)   SpO2 96%   BMI 32.36 kg/m   BP Readings from Last 3 Encounters:  04/26/19 (!) 142/78  12/25/18 (!) 146/72  12/04/18 (!) 150/70    Wt Readings from Last 3 Encounters:  04/26/19 232 lb (105.2 kg)  12/25/18 224 lb (101.6 kg)  12/04/18 226 lb (102.5 kg)    Physical Exam Vitals signs reviewed.  Constitutional:      Appearance: Normal appearance.  HENT:     Nose: Nose normal.     Mouth/Throat:     Mouth: Mucous membranes are moist.  Eyes:     General: No scleral icterus.    Conjunctiva/sclera: Conjunctivae normal.  Neck:     Musculoskeletal: Neck supple. No muscular tenderness.  Cardiovascular:     Rate and Rhythm: Normal rate and regular rhythm.     Heart sounds: No murmur.  Pulmonary:     Effort: Pulmonary effort is normal.     Breath sounds: No stridor. No wheezing, rhonchi or rales.  Abdominal:     General: Abdomen is protuberant. There is no distension.     Palpations: There is no hepatomegaly, splenomegaly or mass.     Tenderness: There is no abdominal tenderness.  Musculoskeletal: Normal range of motion.     Right lower leg: No edema.     Left lower leg: No edema.  Lymphadenopathy:     Cervical: No cervical adenopathy.  Skin:    General: Skin is warm and dry.  Neurological:  General: No focal deficit present.     Mental Status: She is alert.  Psychiatric:        Mood and Affect: Mood normal.        Behavior: Behavior normal.     Lab Results  Component Value Date   WBC 4.8 12/04/2018   HGB 12.9 12/04/2018   HCT 39.7 12/04/2018   PLT 207.0 12/04/2018   GLUCOSE 95 04/26/2019   CHOL 229 (H) 04/26/2019   TRIG 90.0 04/26/2019   HDL 56.00 04/26/2019   LDLDIRECT 158.9 09/05/2012   LDLCALC 155 (H) 04/26/2019   ALT 8 02/20/2018   AST 12 02/20/2018   NA 140 04/26/2019   K 4.1 04/26/2019   CL 107 04/26/2019    CREATININE 0.90 04/26/2019   BUN 19 04/26/2019   CO2 28 04/26/2019   TSH 5.52 (H) 04/26/2019   INR 1.00 04/04/2017   HGBA1C 5.8 (H) 08/24/2015    Dg Lumbar Spine Complete  Result Date: 12/25/2018 CLINICAL DATA:  Chronic low back pain for 2 weeks, no known injury, initial encounter EXAM: LUMBAR SPINE - COMPLETE 4+ VIEW COMPARISON:  05/03/2011 FINDINGS: There are changes could consistent with prior interbody fusion at L4-5 with posterior fixation and pedicle screws. The overall appearance is stable from the prior exam. Mild scoliosis of the mid lumbar spine concave to the right is again seen and stable. Mild disc space narrowing at L2-3 with associated osteophytic changes are seen and stable. No soft tissue abnormality is noted. IMPRESSION: Postoperative and degenerative changes without acute abnormality. Electronically Signed   By: Inez Catalina M.D.   On: 12/25/2018 10:57    Assessment & Plan:   Claudia [name] was seen today for hypertension, hypothyroidism and hyperlipidemia.  Diagnoses and all orders for this visit:  Essential hypertension, benign- Her blood pressure is adequately well controlled.  Electrolytes and renal function are normal. -     Basic metabolic panel; Future  Acquired hypothyroidism- Her TSH is in the acceptable range.  Clinically she appears euthyroid.  I do not think thyroid replacement therapy is indicated. -     TSH; Future  Pure hypercholesterolemia- Statin therapy is not indicated. -     Lipid panel; Future  Routine general medical examination at a health care facility- Exam completed, labs reviewed, vaccines reviewed and updated, no cancer screening measures are indicated, patient education was given.   I am having Claudia [name]. [place] maintain her vitamin C, cetirizine, Linzess, telmisartan-hydrochlorothiazide, and HYDROcodone-acetaminophen.  No orders of the defined types were placed in this encounter.    Follow-up: Return in about 6 months (around  10/25/2019).  Claudia Calico, MD

## 2019-04-26 NOTE — Patient Instructions (Signed)

## 2019-04-27 ENCOUNTER — Encounter: Payer: Self-pay | Admitting: Internal Medicine

## 2019-04-27 LAB — TSH: TSH: 5.52 u[IU]/mL — ABNORMAL HIGH (ref 0.35–4.50)

## 2019-05-01 ENCOUNTER — Other Ambulatory Visit: Payer: Self-pay | Admitting: Internal Medicine

## 2019-05-01 DIAGNOSIS — M170 Bilateral primary osteoarthritis of knee: Secondary | ICD-10-CM

## 2019-05-01 DIAGNOSIS — M961 Postlaminectomy syndrome, not elsewhere classified: Secondary | ICD-10-CM

## 2019-05-01 DIAGNOSIS — M47817 Spondylosis without myelopathy or radiculopathy, lumbosacral region: Secondary | ICD-10-CM

## 2019-05-01 MED ORDER — HYDROCODONE-ACETAMINOPHEN 5-325 MG PO TABS: 1.0000 | ORAL_TABLET | Freq: Four times a day (QID) | ORAL | 0 refills | Status: DC | PRN

## 2019-05-01 NOTE — Telephone Encounter (Signed)
HYDROcodone-acetaminophen (NORCO/VICODIN) 5-325 MG tablet  [facility] [phone] - [place], [place] [phone] (Phone) [phone] (Fax)   Refill

## 2019-05-01 NOTE — Telephone Encounter (Signed)
Requested medication (s) are due for refill today: yes  Requested medication (s) are on the active medication list: yes  Last refill:  04/01/2019  Future visit scheduled: yes  Notes to clinic:  Refill cannot be delegated    Requested Prescriptions  Pending Prescriptions Disp Refills   HYDROcodone-acetaminophen (NORCO/VICODIN) 5-325 MG tablet 90 tablet 0    Sig: Take 1 tablet by mouth every 6 (six) hours as needed for moderate pain.     Not Delegated - Analgesics:  Opioid Agonist Combinations Failed - 05/01/2019 12:30 PM      Failed - This refill cannot be delegated      Failed - Urine Drug Screen completed in last 360 days.      Passed - Valid encounter within last 6 months    Recent Outpatient Visits          5 days ago Essential hypertension, benign   [place], Thomas L, MD   4 months ago Chronic left-sided low back pain without sciatica   [place], MD   4 months ago Acquired hypothyroidism   [place], Thomas L, MD   6 months ago Essential hypertension, benign   [place], Thomas L, MD   10 months ago Acquired hypothyroidism   [place], MD      Future Appointments            In 5 months Ronnald Ramp Arvid Right, MD [place], [facility]

## 2019-05-18 ENCOUNTER — Other Ambulatory Visit: Payer: Self-pay

## 2019-05-28 ENCOUNTER — Other Ambulatory Visit: Payer: Self-pay | Admitting: Internal Medicine

## 2019-05-28 ENCOUNTER — Telehealth: Payer: Self-pay | Admitting: Internal Medicine

## 2019-05-28 NOTE — Telephone Encounter (Signed)
Medication Refill - Medication: HYDROcodone-acetaminophen (NORCO/VICODIN) 5-325 MG tablet    Has the patient contacted their pharmacy? No. (Agent: If no, request that the patient contact the pharmacy for the refill.) (Agent: If yes, when and what did the pharmacy advise?)  Preferred Pharmacy (with phone number or street name):  [facility] [phone] - [place], [address] AT [address]  [name] [address]  Phone: [phone] Fax: [phone]  Not a 24 hour pharmacy; exact hours not known.     Agent: Please be advised that RX refills may take up to 3 business days. We ask that you follow-up with your pharmacy.

## 2019-05-29 ENCOUNTER — Other Ambulatory Visit: Payer: Self-pay | Admitting: Internal Medicine

## 2019-05-29 DIAGNOSIS — M47817 Spondylosis without myelopathy or radiculopathy, lumbosacral region: Secondary | ICD-10-CM

## 2019-05-29 DIAGNOSIS — M961 Postlaminectomy syndrome, not elsewhere classified: Secondary | ICD-10-CM

## 2019-05-29 DIAGNOSIS — M170 Bilateral primary osteoarthritis of knee: Secondary | ICD-10-CM

## 2019-05-29 MED ORDER — HYDROCODONE-ACETAMINOPHEN 5-325 MG PO TABS: 1.0000 | ORAL_TABLET | Freq: Four times a day (QID) | ORAL | 0 refills | Status: DC | PRN

## 2019-07-02 ENCOUNTER — Other Ambulatory Visit: Payer: Self-pay | Admitting: Internal Medicine

## 2019-07-02 DIAGNOSIS — M170 Bilateral primary osteoarthritis of knee: Secondary | ICD-10-CM

## 2019-07-02 DIAGNOSIS — M961 Postlaminectomy syndrome, not elsewhere classified: Secondary | ICD-10-CM

## 2019-07-02 DIAGNOSIS — M47817 Spondylosis without myelopathy or radiculopathy, lumbosacral region: Secondary | ICD-10-CM

## 2019-07-02 MED ORDER — HYDROCODONE-ACETAMINOPHEN 5-325 MG PO TABS: 1.0000 | ORAL_TABLET | Freq: Four times a day (QID) | ORAL | 0 refills | Status: DC | PRN

## 2019-07-02 NOTE — Telephone Encounter (Signed)
Medication Refill - Medication: HYDROcodone-acetaminophen (NORCO/VICODIN) 5-325 MG tablet [id number]     Preferred Pharmacy (with phone number or street name):  [facility] [phone] - [place], [address] AT [address]  [name] [address]  Phone: [phone] Fax: [phone]  Not a 24 hour pharmacy; exact hours not known.     Agent: Please be advised that RX refills may take up to 3 business days. We ask that you follow-up with your pharmacy.

## 2019-07-02 NOTE — Telephone Encounter (Signed)
Requested medication (s) are due for refill today: yes  Requested medication (s) are on the active medication list:  yes  Last refill:  05/29/2019  Future visit scheduled: yes  Notes to clinic:  This refill cannot be delegated    Requested Prescriptions  Pending Prescriptions Disp Refills   HYDROcodone-acetaminophen (NORCO/VICODIN) 5-325 MG tablet 90 tablet 0    Sig: Take 1 tablet by mouth every 6 (six) hours as needed for moderate pain.      Not Delegated - Analgesics:  Opioid Agonist Combinations Failed - 07/02/2019 11:12 AM      Failed - This refill cannot be delegated      Failed - Urine Drug Screen completed in last 360 days.      Passed - Valid encounter within last 6 months    Recent Outpatient Visits           2 months ago Essential hypertension, benign   [place], Thomas L, MD   6 months ago Chronic left-sided low back pain without sciatica   [place], MD   7 months ago Acquired hypothyroidism   [place], Thomas L, MD   8 months ago Essential hypertension, benign   [place], Thomas L, MD   1 year ago Acquired hypothyroidism   [place], MD       Future Appointments             In 3 months Janith Lima, MD [place] at [facility]

## 2019-07-30 ENCOUNTER — Telehealth: Payer: Self-pay | Admitting: Internal Medicine

## 2019-07-30 NOTE — Telephone Encounter (Signed)
        1. Which medications need to be refilled? (please list name of each medication and dose if known)   HYDROcodone-acetaminophen (NORCO/VICODIN) 5-325 MG tablet telmisartan-hydrochlorothiazide (MICARDIS HCT) 80-12.5 MG tablet  2. Which pharmacy/location (including street and city if local pharmacy) is medication to be sent to?[facility] [phone] - [place], [place] AT [place]  3. Do they need a 30 day or 90 day supply? [place]

## 2019-07-31 ENCOUNTER — Other Ambulatory Visit: Payer: Self-pay | Admitting: Internal Medicine

## 2019-07-31 DIAGNOSIS — M170 Bilateral primary osteoarthritis of knee: Secondary | ICD-10-CM

## 2019-07-31 DIAGNOSIS — M961 Postlaminectomy syndrome, not elsewhere classified: Secondary | ICD-10-CM

## 2019-07-31 DIAGNOSIS — I10 Essential (primary) hypertension: Secondary | ICD-10-CM

## 2019-07-31 DIAGNOSIS — M47817 Spondylosis without myelopathy or radiculopathy, lumbosacral region: Secondary | ICD-10-CM

## 2019-07-31 MED ORDER — HYDROCODONE-ACETAMINOPHEN 5-325 MG PO TABS: 1.0000 | ORAL_TABLET | Freq: Four times a day (QID) | ORAL | 0 refills | Status: DC | PRN

## 2019-07-31 MED ORDER — TELMISARTAN-HCTZ 80-12.5 MG PO TABS: 1.0000 | ORAL_TABLET | Freq: Every day | ORAL | 1 refills | Status: DC

## 2019-08-27 ENCOUNTER — Telehealth: Payer: Self-pay

## 2019-08-27 DIAGNOSIS — M170 Bilateral primary osteoarthritis of knee: Secondary | ICD-10-CM

## 2019-08-27 DIAGNOSIS — M47817 Spondylosis without myelopathy or radiculopathy, lumbosacral region: Secondary | ICD-10-CM

## 2019-08-27 DIAGNOSIS — M961 Postlaminectomy syndrome, not elsewhere classified: Secondary | ICD-10-CM

## 2019-08-27 NOTE — Telephone Encounter (Signed)
Medication Refill - Medication: HYDROcodone-acetaminophen (NORCO/VICODIN) 5-325 MG tablet    Has the patient contacted their pharmacy? Yes.   (Agent: If no, request that the patient contact the pharmacy for the refill.) (Agent: If yes, when and what did the pharmacy advise?)  Preferred Pharmacy (with phone number or street name): [facility] [phone] - [place], [place]: Please be advised that RX refills may take up to 3 business days. We ask that you follow-up with your pharmacy.

## 2019-08-29 MED ORDER — HYDROCODONE-ACETAMINOPHEN 5-325 MG PO TABS: 1.0000 | ORAL_TABLET | Freq: Four times a day (QID) | ORAL | 0 refills | Status: DC | PRN

## 2019-08-29 NOTE — Telephone Encounter (Signed)
Refill request for Hydrocodone. Please advise in PCP absence.   Last refill - 07/31/2019 Last visit - 04/26/2019 Due to follow up on 10/25/2019 (appt is scheduled).  Last UDS was 2018

## 2019-08-29 NOTE — Telephone Encounter (Signed)
Done erx 

## 2019-09-27 ENCOUNTER — Telehealth: Payer: Self-pay | Admitting: Internal Medicine

## 2019-09-27 NOTE — Telephone Encounter (Signed)
    1.Medication Requested: HYDROcodone-acetaminophen (NORCO/VICODIN) 5-325 MG tablet  2. Pharmacy (Name, Street, [place]):[facility] [phone] - [place], [place] AT [address]  3. On Med List: yes  4. Last Visit with PCP: 04/26/19  5. Next visit date with PCP: 10/25/19   Agent: Please be advised that RX refills may take up to 3 business days. We ask that you follow-up with your pharmacy.

## 2019-09-28 ENCOUNTER — Other Ambulatory Visit: Payer: Self-pay | Admitting: Internal Medicine

## 2019-09-28 DIAGNOSIS — M47817 Spondylosis without myelopathy or radiculopathy, lumbosacral region: Secondary | ICD-10-CM

## 2019-09-28 DIAGNOSIS — M170 Bilateral primary osteoarthritis of knee: Secondary | ICD-10-CM

## 2019-09-28 DIAGNOSIS — M961 Postlaminectomy syndrome, not elsewhere classified: Secondary | ICD-10-CM

## 2019-09-28 MED ORDER — HYDROCODONE-ACETAMINOPHEN 5-325 MG PO TABS: 1.0000 | ORAL_TABLET | Freq: Four times a day (QID) | ORAL | 0 refills | Status: DC | PRN

## 2019-10-01 ENCOUNTER — Telehealth: Payer: Self-pay

## 2019-10-01 NOTE — Telephone Encounter (Signed)
Hydrocodone was sent in on 09/28/2019

## 2019-10-01 NOTE — Telephone Encounter (Signed)
The patient is aware the office was closed on Friday 4.2.21.   1.Medication Requested:HYDROcodone-acetaminophen (NORCO/VICODIN) 5-325 MG tablet  2. Pharmacy (Name, Street, [place]):[facility] [phone] - [place], [place] AT [address]  3. On Med List: Yes   4. Last Visit with PCP: 10.29.20  5. Next visit date with PCP: 4.29.21    Agent: Please be advised that RX refills may take up to 3 business days. We ask that you follow-up with your pharmacy.

## 2019-10-25 ENCOUNTER — Ambulatory Visit: Payer: PPO | Admitting: Internal Medicine

## 2019-10-25 ENCOUNTER — Other Ambulatory Visit: Payer: Self-pay

## 2019-10-25 ENCOUNTER — Encounter: Payer: Self-pay | Admitting: Internal Medicine

## 2019-10-25 ENCOUNTER — Ambulatory Visit (INDEPENDENT_AMBULATORY_CARE_PROVIDER_SITE_OTHER): Payer: PPO | Admitting: Internal Medicine

## 2019-10-25 VITALS — BP 138/72 | HR 55 | Temp 98.1°F | Resp 16 | Ht 71.0 in | Wt 227.0 lb

## 2019-10-25 DIAGNOSIS — M170 Bilateral primary osteoarthritis of knee: Secondary | ICD-10-CM | POA: Diagnosis not present

## 2019-10-25 DIAGNOSIS — I10 Essential (primary) hypertension: Secondary | ICD-10-CM

## 2019-10-25 DIAGNOSIS — M47817 Spondylosis without myelopathy or radiculopathy, lumbosacral region: Secondary | ICD-10-CM

## 2019-10-25 DIAGNOSIS — M961 Postlaminectomy syndrome, not elsewhere classified: Secondary | ICD-10-CM | POA: Diagnosis not present

## 2019-10-25 DIAGNOSIS — K5904 Chronic idiopathic constipation: Secondary | ICD-10-CM

## 2019-10-25 DIAGNOSIS — E039 Hypothyroidism, unspecified: Secondary | ICD-10-CM

## 2019-10-25 LAB — BASIC METABOLIC PANEL
BUN: 26 mg/dL — ABNORMAL HIGH (ref 6–23)
CO2: 29 mEq/L (ref 19–32)
Calcium: 10.1 mg/dL (ref 8.4–10.5)
Chloride: 107 mEq/L (ref 96–112)
Creatinine, Ser: 1 mg/dL (ref 0.40–1.20)
GFR: 63.97 mL/min (ref >60.00)
Glucose, Bld: 104 mg/dL — ABNORMAL HIGH (ref 70–99)
Potassium: 5.4 mEq/L — ABNORMAL HIGH (ref 3.5–5.1)
Sodium: 141 mEq/L (ref 135–145)

## 2019-10-25 LAB — TSH: TSH: 3.99 u[IU]/mL (ref 0.35–4.50)

## 2019-10-25 NOTE — Progress Notes (Signed)
Subjective:  Patient ID: Claudia [name], female    DOB: 11/14/1933  Age: 86 y.o. MRN: [id number]  CC: Follow-up (6 month follow- up), Hypertension, and Osteoarthritis  This visit occurred during the SARS-CoV-2 public health emergency.  Safety protocols were in place, including screening questions prior to the visit, additional usage of staff PPE, and extensive cleaning of exam room while observing appropriate contact time as indicated for disinfecting solutions.     HPI Claudia [name] presents for f/up - She is in her usual state of health with chronic pain.  She tells me her blood pressure has been well controlled.  She denies any recent episodes of headache, blurred vision, chest pain, shortness of breath, weight changes, palpitations, edema, or fatigue.   Outpatient Medications Prior to Visit  Medication Sig Dispense Refill  . cetirizine (ZYRTEC) 10 MG tablet Take 1 tablet (10 mg total) by mouth daily. 90 tablet 1  . telmisartan-hydrochlorothiazide (MICARDIS HCT) 80-12.5 MG tablet Take 1 tablet by mouth daily. 90 tablet 1  . vitamin C (ASCORBIC ACID) 500 MG tablet Take 500 mg by mouth daily.    [name] HYDROcodone-acetaminophen (NORCO/VICODIN) 5-325 MG tablet Take 1 tablet by mouth every 6 (six) hours as needed for moderate pain. 90 tablet 0  . LINZESS 290 MCG CAPS capsule TAKE 1 CAPSULE BY MOUTH DAILY BEFORE BREAKFAST 90 capsule 1   No facility-administered medications prior to visit.    ROS Review of Systems  Constitutional: Negative.  Negative for appetite change, diaphoresis, fatigue and unexpected weight change.  HENT: Negative.   Eyes: Negative for visual disturbance.  Respiratory: Negative for cough, chest tightness, shortness of breath and wheezing.   Cardiovascular: Negative for chest pain, palpitations and leg swelling.  Gastrointestinal: Positive for constipation. Negative for abdominal pain, nausea and vomiting.       Constipation is well controlled with Linzess    Endocrine: Negative.   Genitourinary: Negative.  Negative for difficulty urinating.  Musculoskeletal: Positive for arthralgias and back pain. Negative for joint swelling and myalgias.  Skin: Negative.  Negative for color change and pallor.  Neurological: Negative.  Negative for dizziness and weakness.  Hematological: Negative for adenopathy. Does not bruise/bleed easily.  Psychiatric/Behavioral: Negative.     Objective:  BP 138/72 (BP Location: Left Arm, Patient Position: Sitting, Cuff Size: Normal)   Pulse (!) 55   Temp 98.1 F (36.7 C) (Oral)   Resp 16   Ht 5\' 11"  (1.803 m)   Wt 227 lb (103 kg)   SpO2 98%   BMI 31.66 kg/m   BP Readings from Last 3 Encounters:  10/25/19 138/72  04/26/19 (!) 142/78  12/25/18 (!) 146/72    Wt Readings from Last 3 Encounters:  10/25/19 227 lb (103 kg)  04/26/19 232 lb (105.2 kg)  12/25/18 224 lb (101.6 kg)    Physical Exam Vitals reviewed.  HENT:     Nose: Nose normal.     Mouth/Throat:     Mouth: Mucous membranes are moist.  Eyes:     General: No scleral icterus.    Conjunctiva/sclera: Conjunctivae normal.  Cardiovascular:     Rate and Rhythm: Normal rate and regular rhythm.     Heart sounds: No murmur.  Pulmonary:     Effort: Pulmonary effort is normal.     Breath sounds: No stridor. No wheezing, rhonchi or rales.  Abdominal:     General: Abdomen is protuberant. Bowel sounds are normal. There is no distension.     Palpations:  Abdomen is soft. There is no hepatomegaly, splenomegaly or mass.     Tenderness: There is no abdominal tenderness.  Musculoskeletal:        General: Deformity (djd) present. Normal range of motion.     Cervical back: Neck supple.     Right lower leg: No edema.     Left lower leg: No edema.  Lymphadenopathy:     Cervical: No cervical adenopathy.  Skin:    General: Skin is warm and dry.     Coloration: Skin is not pale.  Neurological:     General: No focal deficit present.     Mental Status: She is  alert.  Psychiatric:        Mood and Affect: Mood normal.     Lab Results  Component Value Date   WBC 4.8 12/04/2018   HGB 12.9 12/04/2018   HCT 39.7 12/04/2018   PLT 207.0 12/04/2018   GLUCOSE 104 (H) 10/25/2019   CHOL 229 (H) 04/26/2019   TRIG 90.0 04/26/2019   HDL 56.00 04/26/2019   LDLDIRECT 158.9 09/05/2012   LDLCALC 155 (H) 04/26/2019   ALT 8 02/20/2018   AST 12 02/20/2018   NA 141 10/25/2019   K 5.4 No hemolysis seen (H) 10/25/2019   CL 107 10/25/2019   CREATININE 1.00 10/25/2019   BUN 26 (H) 10/25/2019   CO2 29 10/25/2019   TSH 3.99 10/25/2019   INR 1.00 04/04/2017   HGBA1C 5.8 (H) 08/24/2015    DG Lumbar Spine Complete  Result Date: 12/25/2018 CLINICAL DATA:  Chronic low back pain for 2 weeks, no known injury, initial encounter EXAM: LUMBAR SPINE - COMPLETE 4+ VIEW COMPARISON:  05/03/2011 FINDINGS: There are changes could consistent with prior interbody fusion at L4-5 with posterior fixation and pedicle screws. The overall appearance is stable from the prior exam. Mild scoliosis of the mid lumbar spine concave to the right is again seen and stable. Mild disc space narrowing at L2-3 with associated osteophytic changes are seen and stable. No soft tissue abnormality is noted. IMPRESSION: Postoperative and degenerative changes without acute abnormality. Electronically Signed   By: Inez Catalina M.D.   On: 12/25/2018 10:57    Assessment & Plan:   Claudia [name] was seen today for follow-up, hypertension and osteoarthritis.  Diagnoses and all orders for this visit:  Acquired hypothyroidism- Her TSH is normal.  Thyroid replacement therapy is not indicated. -     TSH; Future -     TSH  Essential hypertension, benign- Her blood pressure is adequately well controlled. -     Basic metabolic panel; Future -     Basic metabolic panel  Primary osteoarthritis of both knees -     HYDROcodone-acetaminophen (NORCO/VICODIN) 5-325 MG tablet; Take 1 tablet by mouth every 6 (six)  hours as needed for moderate pain.  Postlaminectomy syndrome, lumbar region -     HYDROcodone-acetaminophen (NORCO/VICODIN) 5-325 MG tablet; Take 1 tablet by mouth every 6 (six) hours as needed for moderate pain.  Lumbosacral spondylosis without myelopathy -     HYDROcodone-acetaminophen (NORCO/VICODIN) 5-325 MG tablet; Take 1 tablet by mouth every 6 (six) hours as needed for moderate pain.  Chronic idiopathic constipation -     linaclotide (LINZESS) 290 MCG CAPS capsule; TAKE 1 CAPSULE BY MOUTH DAILY BEFORE BREAKFAST   I have changed Claudia [name]'s Linzess to linaclotide. I am also having her maintain her vitamin C, cetirizine, telmisartan-hydrochlorothiazide, and HYDROcodone-acetaminophen.  Meds ordered this encounter  Medications  . HYDROcodone-acetaminophen (NORCO/VICODIN)  5-325 MG tablet    Sig: Take 1 tablet by mouth every 6 (six) hours as needed for moderate pain.    Dispense:  90 tablet    Refill:  0  . linaclotide (LINZESS) 290 MCG CAPS capsule    Sig: TAKE 1 CAPSULE BY MOUTH DAILY BEFORE BREAKFAST    Dispense:  90 capsule    Refill:  1     Follow-up: Return in about 6 months (around 04/25/2020).  [name], MD

## 2019-10-25 NOTE — Patient Instructions (Signed)

## 2019-10-27 MED ORDER — LINACLOTIDE 290 MCG PO CAPS: ORAL_CAPSULE | ORAL | 1 refills | Status: DC

## 2019-10-27 MED ORDER — HYDROCODONE-ACETAMINOPHEN 5-325 MG PO TABS: 1.0000 | ORAL_TABLET | Freq: Four times a day (QID) | ORAL | 0 refills | Status: DC | PRN

## 2019-11-08 ENCOUNTER — Other Ambulatory Visit: Payer: Self-pay | Admitting: Internal Medicine

## 2019-11-08 DIAGNOSIS — I10 Essential (primary) hypertension: Secondary | ICD-10-CM

## 2019-11-27 ENCOUNTER — Telehealth: Payer: Self-pay | Admitting: Internal Medicine

## 2019-11-27 NOTE — Telephone Encounter (Signed)
     1.Medication Requested: HYDROcodone-acetaminophen (NORCO/VICODIN) 5-325 MG tablet  2. Pharmacy (Name, Street, [place]):[facility] [phone] - [place], [place] [address] [place]  3. On Med List: yes  4. Last Visit with PCP: 10/25/2019  5. Next visit date with PCP:05/01/20   Agent: Please be advised that RX refills may take up to 3 business days. We ask that you follow-up with your pharmacy.

## 2019-11-29 ENCOUNTER — Other Ambulatory Visit: Payer: Self-pay | Admitting: Internal Medicine

## 2019-11-29 DIAGNOSIS — M961 Postlaminectomy syndrome, not elsewhere classified: Secondary | ICD-10-CM

## 2019-11-29 DIAGNOSIS — M47817 Spondylosis without myelopathy or radiculopathy, lumbosacral region: Secondary | ICD-10-CM

## 2019-11-29 DIAGNOSIS — M170 Bilateral primary osteoarthritis of knee: Secondary | ICD-10-CM

## 2019-11-29 MED ORDER — HYDROCODONE-ACETAMINOPHEN 5-325 MG PO TABS: 1.0000 | ORAL_TABLET | Freq: Four times a day (QID) | ORAL | 0 refills | Status: DC | PRN

## 2019-12-26 ENCOUNTER — Telehealth: Payer: Self-pay | Admitting: Internal Medicine

## 2019-12-26 NOTE — Telephone Encounter (Signed)
1.Medication Requested: HYDROcodone-acetaminophen (NORCO/VICODIN) 5-325 MG tablet   2. Pharmacy (Name, Street, [place]): [facility] [phone] - [place], [address] [place] AT [place] Phone:  [phone]  Fax:  [phone]       3. On Med List: Y  4. Last Visit with PCP: 4.29.21  5. Next visit date with PCP:  11.4.21   Agent: Please be advised that RX refills may take up to 3 business days. We ask that you follow-up with your pharmacy.  Patient requesting script be called in so that it is ready on December 29, 2019 since it is a holiday weekend.

## 2019-12-27 ENCOUNTER — Other Ambulatory Visit: Payer: Self-pay | Admitting: Internal Medicine

## 2019-12-27 DIAGNOSIS — M961 Postlaminectomy syndrome, not elsewhere classified: Secondary | ICD-10-CM

## 2019-12-27 DIAGNOSIS — M47817 Spondylosis without myelopathy or radiculopathy, lumbosacral region: Secondary | ICD-10-CM

## 2019-12-27 DIAGNOSIS — M170 Bilateral primary osteoarthritis of knee: Secondary | ICD-10-CM

## 2019-12-27 MED ORDER — HYDROCODONE-ACETAMINOPHEN 5-325 MG PO TABS: 1.0000 | ORAL_TABLET | Freq: Four times a day (QID) | ORAL | 0 refills | Status: DC | PRN

## 2020-01-01 DIAGNOSIS — H1013 Acute atopic conjunctivitis, bilateral: Secondary | ICD-10-CM | POA: Diagnosis not present

## 2020-01-01 DIAGNOSIS — Z961 Presence of intraocular lens: Secondary | ICD-10-CM | POA: Diagnosis not present

## 2020-01-28 ENCOUNTER — Telehealth: Payer: Self-pay | Admitting: Internal Medicine

## 2020-01-28 NOTE — Telephone Encounter (Signed)
Sent to Dr. John. 

## 2020-01-28 NOTE — Telephone Encounter (Signed)
    1.Medication Requested:HYDROcodone-acetaminophen (NORCO/VICODIN) 5-325 MG tablet  2. Pharmacy (Name, Street, [place]):[facility] [phone] - [place], [place] [address] [place]  3. On Med List: yes  4. Last Visit with PCP:   5. Next visit date with PCP: 11/04   Agent: Please be advised that RX refills may take up to 3 business days. We ask that you follow-up with your pharmacy.

## 2020-01-28 NOTE — Telephone Encounter (Signed)
Ok to forward to pcp 

## 2020-01-31 ENCOUNTER — Other Ambulatory Visit: Payer: Self-pay | Admitting: Internal Medicine

## 2020-01-31 DIAGNOSIS — M170 Bilateral primary osteoarthritis of knee: Secondary | ICD-10-CM

## 2020-01-31 DIAGNOSIS — M47817 Spondylosis without myelopathy or radiculopathy, lumbosacral region: Secondary | ICD-10-CM

## 2020-01-31 DIAGNOSIS — M961 Postlaminectomy syndrome, not elsewhere classified: Secondary | ICD-10-CM

## 2020-01-31 MED ORDER — HYDROCODONE-ACETAMINOPHEN 5-325 MG PO TABS: 1.0000 | ORAL_TABLET | Freq: Four times a day (QID) | ORAL | 0 refills | Status: DC | PRN

## 2020-01-31 NOTE — Telephone Encounter (Signed)
Pt is calling back - refill request for Hydrocodone.   Please advise.   Last refill was 12/27/2019.  Last OV was 10/25/2019. Pt has a follow up visit on 04/30/2020.

## 2020-01-31 NOTE — Telephone Encounter (Signed)
F/u    Patient is asking for call back on her cell phone regarding her medication

## 2020-02-26 ENCOUNTER — Other Ambulatory Visit: Payer: Self-pay | Admitting: Internal Medicine

## 2020-02-26 ENCOUNTER — Telehealth: Payer: Self-pay | Admitting: Internal Medicine

## 2020-02-26 DIAGNOSIS — K5904 Chronic idiopathic constipation: Secondary | ICD-10-CM

## 2020-02-26 MED ORDER — LINACLOTIDE 290 MCG PO CAPS: ORAL_CAPSULE | ORAL | 1 refills | Status: DC

## 2020-02-26 NOTE — Telephone Encounter (Signed)
New message:   Pt is calling and would like to know why the Dr will not approve her linaclotide (LINZESS) 290 MCG CAPS capsule anymore. Please advise.

## 2020-02-29 ENCOUNTER — Telehealth: Payer: Self-pay | Admitting: Emergency Medicine

## 2020-02-29 NOTE — Telephone Encounter (Signed)
Pt is calling about refill request for Hydrocodone. Please advise thanks. Pharmacy is [facility].  Last refill was 01/31/2020.  Last OV was 10/25/2019. Pt has a follow up visit on 04/30/2020.

## 2020-03-03 ENCOUNTER — Other Ambulatory Visit: Payer: Self-pay | Admitting: Internal Medicine

## 2020-03-03 DIAGNOSIS — M961 Postlaminectomy syndrome, not elsewhere classified: Secondary | ICD-10-CM

## 2020-03-03 DIAGNOSIS — M170 Bilateral primary osteoarthritis of knee: Secondary | ICD-10-CM

## 2020-03-03 DIAGNOSIS — M47817 Spondylosis without myelopathy or radiculopathy, lumbosacral region: Secondary | ICD-10-CM

## 2020-03-03 MED ORDER — HYDROCODONE-ACETAMINOPHEN 5-325 MG PO TABS: 1.0000 | ORAL_TABLET | Freq: Four times a day (QID) | ORAL | 0 refills | Status: DC | PRN

## 2020-03-31 ENCOUNTER — Other Ambulatory Visit: Payer: Self-pay | Admitting: Internal Medicine

## 2020-03-31 ENCOUNTER — Telehealth: Payer: Self-pay | Admitting: Internal Medicine

## 2020-03-31 DIAGNOSIS — M47817 Spondylosis without myelopathy or radiculopathy, lumbosacral region: Secondary | ICD-10-CM

## 2020-03-31 DIAGNOSIS — M170 Bilateral primary osteoarthritis of knee: Secondary | ICD-10-CM

## 2020-03-31 DIAGNOSIS — M961 Postlaminectomy syndrome, not elsewhere classified: Secondary | ICD-10-CM

## 2020-03-31 MED ORDER — HYDROCODONE-ACETAMINOPHEN 5-325 MG PO TABS: 1.0000 | ORAL_TABLET | Freq: Four times a day (QID) | ORAL | 0 refills | Status: DC | PRN

## 2020-03-31 NOTE — Telephone Encounter (Signed)
Patient called and said that her pharmacy told her that Dr. Ronnald Ramp needs to send over a fax regarding a med refill for HYDROcodone-acetaminophen (NORCO/VICODIN) 5-325  It can be faxed to [id number] - [place], [place] - [address] AT [address]  Fax: [phone]

## 2020-04-09 ENCOUNTER — Telehealth: Payer: Self-pay | Admitting: Internal Medicine

## 2020-04-09 NOTE — Telephone Encounter (Signed)
Pt has been informed but declines to got to the ED.

## 2020-04-09 NOTE — Telephone Encounter (Signed)
[facility] Report/Call : ---Caller advised that she has had SOB twice in the last 3 weeks. Happened three weeks ago while running errands. Happened yesterday and was out running errands. Advised that she has no other symptoms. Advised that she is NOT sob at this time.

## 2020-04-09 NOTE — Telephone Encounter (Signed)
    Patient calling to report episodes  SOB on exertion. Patient doesn't have any other symptoms. She was offered same day appointment with other provider, she declined, stating she only wants to see Dr Ronnald Ramp Appointment 10/19   Call transferred to [place]

## 2020-04-09 NOTE — Telephone Encounter (Signed)
Triage nurse called and states the patient has SOB and low heart rate. This happen 3 weeks ago was the first time this happen then happen again yesterday.   Patient needs to be seen within 4 hours. We do not have any opening appointments.  Please follow up with patient.

## 2020-04-11 ENCOUNTER — Telehealth: Payer: Self-pay | Admitting: Cardiovascular Disease

## 2020-04-11 NOTE — Telephone Encounter (Signed)
Patient c/o Palpitations:  High priority if patient c/o lightheadedness, shortness of breath, or chest pain  1) How long have you had palpitations/irregular HR/ Afib? Are you having the symptoms now? Palpitations- 3 weeks ago and it stopped- this week it started again  2) Are you currently experiencing lightheadedness, SOB or CP? A littlee shortness of breath  3) Do you have a history of afib (atrial fibrillation) or irregular heart rhythm? *no  4) Have you checked your BP or HR? (document readings if available): 135/70 yesterday 107/64 last week  5) Are you experiencing any other symptoms? *feels extremely tired- pt would like to be seen

## 2020-04-11 NOTE — Telephone Encounter (Signed)
OK thank you 

## 2020-04-11 NOTE — Telephone Encounter (Signed)
Spoke to patient she stated she has had 2 episodes of fast heart beat.Stated first episode lasted appox 3 to 4 hours.Stated she had a episode this past Tue 10/12 that lasted appox 2 hours.She felt sob,tired.No chest pain.Appointment scheduled with Almyra Deforest PA 10/21 at 9:45 AM.Advised to go to ED if she has another episode.

## 2020-04-15 ENCOUNTER — Ambulatory Visit: Payer: PPO | Admitting: Internal Medicine

## 2020-04-17 ENCOUNTER — Ambulatory Visit (INDEPENDENT_AMBULATORY_CARE_PROVIDER_SITE_OTHER): Payer: PPO | Admitting: Physician Assistant

## 2020-04-17 ENCOUNTER — Telehealth: Payer: Self-pay | Admitting: Radiology

## 2020-04-17 ENCOUNTER — Encounter: Payer: Self-pay | Admitting: Physician Assistant

## 2020-04-17 ENCOUNTER — Ambulatory Visit: Payer: PPO

## 2020-04-17 ENCOUNTER — Other Ambulatory Visit: Payer: Self-pay

## 2020-04-17 VITALS — BP 149/79 | HR 65 | Ht 70.0 in | Wt 219.0 lb

## 2020-04-17 DIAGNOSIS — R001 Bradycardia, unspecified: Secondary | ICD-10-CM

## 2020-04-17 DIAGNOSIS — R002 Palpitations: Secondary | ICD-10-CM

## 2020-04-17 DIAGNOSIS — E785 Hyperlipidemia, unspecified: Secondary | ICD-10-CM | POA: Diagnosis not present

## 2020-04-17 DIAGNOSIS — I10 Essential (primary) hypertension: Secondary | ICD-10-CM

## 2020-04-17 DIAGNOSIS — R0789 Other chest pain: Secondary | ICD-10-CM | POA: Diagnosis not present

## 2020-04-17 DIAGNOSIS — E039 Hypothyroidism, unspecified: Secondary | ICD-10-CM

## 2020-04-17 MED ORDER — ROSUVASTATIN CALCIUM 10 MG PO TABS: 10.0000 mg | ORAL_TABLET | Freq: Every day | ORAL | 3 refills | Status: DC

## 2020-04-17 NOTE — Telephone Encounter (Signed)
Enrolled patient for a 30 day [employer] Event Monitor to be mailed to patients home  

## 2020-04-17 NOTE — Patient Instructions (Addendum)
Medication Instructions:   START Crestor 10 mg daily  *If you need a refill on your cardiac medications before your next appointment, please call your pharmacy*  Lab Work: NONE ordered at this time of appointment   If you have labs (blood work) drawn today and your tests are completely normal, you will receive your results only by: [name] MyChart Message (if you have MyChart) OR . A paper copy in the mail If you have any lab test that is abnormal or we need to change your treatment, we will call you to review the results.  Testing/Procedures:  [employer] Cardiac Event Monitor Instructions Your physician has requested you wear your cardiac event monitor for 30 days, (1-30). [employer] may call or text to confirm a shipping address. The monitor will be sent to a land address via UPS. [employer] will not ship a monitor to a PO BOX. It typically takes 3-5 days to receive your monitor after it has been enrolled. [employer] will assist with USPS tracking if your package is delayed. The telephone number for [employer] is [phone]. Once you have received your monitor, please review the enclosed instructions. Instruction tutorials can also be viewed under help and settings on the enclosed cell phone. Your monitor has already been registered assigning a specific monitor serial # to you.  Applying the monitor Remove cell phone from case and turn it on. The cell phone works as Dealer and needs to be within [employer] of you at all times. The cell phone will need to be charged on a daily basis. We recommend you plug the cell phone into the enclosed charger at your bedside table every night.  Monitor batteries: You will receive two monitor batteries labelled #1 and #2. These are your recorders. Plug battery #2 onto the second connection on the enclosed charger. Keep one battery on the charger at all times. This will keep the monitor battery deactivated. It will also keep it fully charged  for when you need to switch your monitor batteries. A small light will be blinking on the battery emblem when it is charging. The light on the battery emblem will remain on when the battery is fully charged.  Open package of a Monitor strip. Insert battery #1 into black hood on strip and gently squeeze monitor battery onto connection as indicated in instruction booklet. Set aside while preparing skin.  Choose location for your strip, vertical or horizontal, as indicated in the instruction booklet. Shave to remove all hair from location. There cannot be any lotions, oils, powders, or colognes on skin where monitor is to be applied. Wipe skin clean with enclosed Saline wipe. Dry skin completely.  Peel paper labeled #1 off the back of the Monitor strip exposing the adhesive. Place the monitor on the chest in the vertical or horizontal position shown in the instruction booklet. One arrow on the monitor strip must be pointing upward. Carefully remove paper labeled #2, attaching remainder of strip to your skin. Try not to create any folds or wrinkles in the strip as you apply it.  Firmly press and release the circle in the center of the monitor battery. You will hear a small beep. This is turning the monitor battery on. The heart emblem on the monitor battery will light up every 5 seconds if the monitor battery in turned on and connected to the patient securely. Do not push and hold the circle down as this turns the monitor battery off. The cell phone will locate the monitor  battery. A screen will appear on the cell phone checking the connection of your monitor strip. This may read poor connection initially but change to good connection within the next minute. Once your monitor accepts the connection you will hear a series of 3 beeps followed by a climbing crescendo of beeps. A screen will appear on the cell phone showing the two monitor strip placement options. Touch the picture that demonstrates  where you applied the monitor strip.  Your monitor strip and battery are waterproof. You are able to shower, bathe, or swim with the monitor on. They just ask you do not submerge deeper than 3 feet underwater. We recommend removing the monitor if you are swimming in a lake, river, or ocean.  Your monitor battery will need to be switched to a fully charged monitor battery approximately once a week. The cell phone will alert you of an action which needs to be made.  On the cell phone, tap for details to reveal connection status, monitor battery status, and cell phone battery status. The green dots indicates your monitor is in good status. A red dot indicates there is something that needs your attention.  To record a symptom, click the circle on the monitor battery. In 30-60 seconds a list of symptoms will appear on the cell phone. Select your symptom and tap save. Your monitor will record a sustained or significant arrhythmia regardless of you clicking the button. Some patients do not feel the heart rhythm irregularities. [employer] will notify us of any serious or critical events.  Refer to instruction booklet for instructions on switching batteries, changing strips, the Do not disturb or Pause features, or any additional questions.  Call [employer] at [phone], to confirm your monitor is transmitting and record your baseline. They will answer any questions you may have regarding the monitor instructions at that time.  Returning the monitor to [place] all equipment back into blue box. Peel off strip of paper to expose adhesive and close box securely. There is a prepaid UPS shipping label on this box. Drop in a UPS drop box, or at a UPS facility like [employer]. You may also contact [employer] to arrange UPS to pick up monitor package at your home.  Follow-Up: At [facility], you and your health needs are our priority.  As part of our continuing mission to provide you with  exceptional heart care, we have created designated Provider Care Teams.  These Care Teams include your primary Cardiologist (physician) and Advanced Practice Providers (APPs -  Physician Assistants and Nurse Practitioners) who all work together to provide you with the care you need, when you need it.  We recommend signing up for the patient portal called "MyChart".  Sign up information is provided on this After Visit Summary.  MyChart is used to connect with patients for Virtual Visits (Telemedicine).  Patients are able to view lab/test results, encounter notes, upcoming appointments, etc.  Non-urgent messages can be sent to your provider as well.   To learn more about what you can do with MyChart, go to [email or web address].    Your next appointment:   7-8 week(s)  The format for your next appointment:   In Person  Provider:   Almyra Deforest, PA-C  Other Instructions

## 2020-04-17 NOTE — Progress Notes (Signed)
Cardiology Office Note:    Date:  04/17/2020   ID:  Claudia [name], Claudia [name] February 14, 1934, MRN [id number]  PCP:  Janith Lima, MD  [facility] Cardiologist:  Skeet Latch, MD  [place] Electrophysiologist:  None   Referring MD: Janith Lima, MD   Chief Complaint  Patient presents with  . Follow-up    seen for Dr. Oval Linsey    History of Present Illness:    Claudia [name] is a 86 y.o. female with a hx of HTN, HLD, hypothyroidism, and history of bradycardia.  She was seen by Dr. Harrington Challenger in 2017 due to bradycardia seen on heart monitor.  Last echocardiogram obtained on 03/30/2016 showed EF 55 to 60%, trivial AI, moderate LAE, PA peak pressure 31 mmHg.  Patient was previously referred to Dr. Oval Linsey for evaluation bradycardia and was last seen in February 2019.  At the time, she was asymptomatic with bradycardia, therefore there was no indication for pacemaker.  Continued observation was recommended with recommendation to avoid any AV nodal blocking agent.   Patient presents today for evaluation of tachycardia palpitation.  So far she only had 2 episodes of tachycardia palpitation.  The first episode occurred after he visited [place] roughly 3 weeks ago.  And last about 5 to 6 hours before self resolving.  Second episode occurred last week and at this time and lasted about 3 hours before self resolving.  She did not check her blood pressure or heart rate during the episode however felt her heart racing very fast.  She denies any dizziness but does endorse shortness of breath and fatigue during the episodes.  She has some tingling sensation in the chest recently, this episodes are quite transient and it does not seems to correlate with degree of exertion.  At this time, I recommended 30-day heart monitor with live monitoring.  Her last lipid panel obtained in October 2020 also showed uncontrolled cholesterol, I recommend starting on low-dose Crestor 10 mg daily.  She has previous  intolerance of with Lipitor.  If she fails Crestor, then we can consider PCSK9 inhibitor.  She will need fasting lipid panel and LFT in 6 to 8 weeks, this can be done either at PCPs office or here.  Past Medical History:  Diagnosis Date  . Abnormality of gait   . Brachial neuritis or radiculitis NOS   . Degeneration of lumbar or lumbosacral intervertebral disc   . Essential hypertension, benign   . Gouty arthropathy   . Lumbago   . Obesity   . Osteoarthritis   . Pure hypercholesterolemia   . Unspecified hypothyroidism     Past Surgical History:  Procedure Laterality Date  . ABDOMINAL HYSTERECTOMY  03/13/2010  . BACK SURGERY    . LUMBAR LAMINECTOMY  03/13/2010  . right knee replacement  03/13/2010    Current Medications: Current Meds  Medication Sig  . cetirizine (ZYRTEC) 10 MG tablet Take 1 tablet (10 mg total) by mouth daily.  [name] HYDROcodone-acetaminophen (NORCO/VICODIN) 5-325 MG tablet Take 1 tablet by mouth every 6 (six) hours as needed for moderate pain.  [name] linaclotide (LINZESS) 290 MCG CAPS capsule TAKE 1 CAPSULE BY MOUTH DAILY BEFORE BREAKFAST  . telmisartan-hydrochlorothiazide (MICARDIS HCT) 80-12.5 MG tablet Take 1 tablet by mouth daily.  . vitamin C (ASCORBIC ACID) 500 MG tablet Take 500 mg by mouth daily.     Allergies:   Lipitor [atorvastatin] and Trileptal [oxcarbazepine]   Social History   Socioeconomic History  . Marital status: Married  Spouse name: Not on file  . Number of children: Not on file  . Years of education: Not on file  . Highest education level: Not on file  Occupational History  . Not on file  Tobacco Use  . Smoking status: Never Smoker  . Smokeless tobacco: Never Used  Substance and Sexual Activity  . Alcohol use: No    Comment: social  . Drug use: No  . Sexual activity: Never  Other Topics Concern  . Not on file  Social History Narrative  . Not on file   Social Determinants of Health   Financial Resource Strain:   . Difficulty  of Paying Living Expenses: Not on file  Food Insecurity:   . Worried About Charity fundraiser in the Last Year: Not on file  . Ran Out of Food in the Last Year: Not on file  Transportation Needs:   . Lack of Transportation (Medical): Not on file  . Lack of Transportation (Non-Medical): Not on file  Physical Activity:   . Days of Exercise per Week: Not on file  . Minutes of Exercise per Session: Not on file  Stress:   . Feeling of Stress : Not on file  Social Connections:   . Frequency of Communication with Friends and Family: Not on file  . Frequency of Social Gatherings with Friends and Family: Not on file  . Attends Religious Services: Not on file  . Active Member of Clubs or Organizations: Not on file  . Attends Archivist Meetings: Not on file  . Marital Status: Not on file     Family History: The patient's family history includes Alzheimer's disease in her mother; Cirrhosis in her brother; Diabetes in her brother, sister, sister, and sister; Heart attack in her brother and father; Heart disease in her father; Hypertension in her mother. There is no history of Cancer or Kidney disease.  ROS:   Please see the history of present illness.     All other systems reviewed and are negative.  EKGs/Labs/Other Studies Reviewed:    The following studies were reviewed today:  Echo 03/30/2016 LV EF: 55% -  60%   -------------------------------------------------------------------  Indications:   Edema (R60.1).   -------------------------------------------------------------------  History:  Risk factors: Bradycardia. Hypertension.   -------------------------------------------------------------------  Study Conclusions   - Left ventricle: The cavity size was normal. Wall thickness was  increased in a pattern of moderate LVH. There was focal basal  hypertrophy. Systolic function was normal. The estimated ejection  fraction was in the range of 55% to 60%. Wall  motion was normal;  there were no regional wall motion abnormalities. Left  ventricular diastolic function parameters were normal for the  patient&'s age.  - Aortic valve: There was trivial regurgitation.  - Left atrium: The atrium was moderately dilated.  - Pulmonary arteries: Systolic pressure was mildly increased. PA  peak pressure: 31 mm Hg (S).   EKG:  EKG is ordered today.  The ekg ordered today demonstrates normal sinus rhythm without significant ST-T wave changes  Recent Labs: 10/25/2019: BUN 26; Creatinine, Ser 1.00; Potassium 5.4 No hemolysis seen; Sodium 141; TSH 3.99  Recent Lipid Panel    Component Value Date/Time   CHOL 229 (H) 04/26/2019 1012   TRIG 90.0 04/26/2019 1012   HDL 56.00 04/26/2019 1012   CHOLHDL 4 04/26/2019 1012   VLDL 18.0 04/26/2019 1012   LDLCALC 155 (H) 04/26/2019 1012   LDLDIRECT 158.9 09/05/2012 1038     Risk Assessment/Calculations:  Physical Exam:    VS:  BP (!) 149/79   Pulse 65   Ht 5\' 10"  (1.778 m)   Wt 219 lb (99.3 kg)   BMI 31.42 kg/m     Wt Readings from Last 3 Encounters:  04/17/20 219 lb (99.3 kg)  10/25/19 227 lb (103 kg)  04/26/19 232 lb (105.2 kg)     GEN:  Well nourished, well developed in no acute distress HEENT: Normal NECK: No JVD; No carotid bruits LYMPHATICS: No lymphadenopathy CARDIAC: RRR, no murmurs, rubs, gallops RESPIRATORY:  Clear to auscultation without rales, wheezing or rhonchi  ABDOMEN: Soft, non-tender, non-distended MUSCULOSKELETAL:  No edema; No deformity  SKIN: Warm and dry NEUROLOGIC:  Alert and oriented x 3 PSYCHIATRIC:  Normal affect   ASSESSMENT:    1. Palpitations   2. Primary hypertension   3. Hyperlipidemia LDL goal <100   4. Hypothyroidism, unspecified type   5. Bradycardia    PLAN:    In order of problems listed above:  1. Palpitation: She had 2 episode of tachycardia palpitation recently.  Both lasted several hours.  I suspect patient likely has atrial  fibrillation.  I recommended a 30-day event monitor.  2. Hypertension: Blood pressure mildly elevated today.  Continue on current therapy, further medication adjustment next time based on heart monitor results  3. Hyperlipidemia: Uncontrolled and previously on no medication.  She had previous intolerance of Lipitor.  I recommended Crestor 10 mg daily.  Will need fasting lipid panel and LFT in 6 to 8 weeks, this can be done either in our office or PCPs office.  4. Hypothyroidism: Managed by primary care provider.  Recent TSH was normal  5. History of asymptomatic bradycardia: Unable to add any AV nodal blocking agent due to psych reason  6. Atypical chest pain: She only had 2 episodes of very transient tingling sensation in the chest.  Neither episode occurred with physical activity.  I recommended initial work-up to check her palpitation with heart monitor.  If symptom occurs mostly with exertion or become more prolonged, then we can consider additional evaluation with either coronary CT or Myoview.   Medication Adjustments/Labs and Tests Ordered: Current medicines are reviewed at length with the patient today.  Concerns regarding medicines are outlined above.  No orders of the defined types were placed in this encounter.  No orders of the defined types were placed in this encounter.   Patient Instructions  Medication Instructions:   START Crestor 10 mg daily  *If you need a refill on your cardiac medications before your next appointment, please call your pharmacy*   Lab Work: NONE ordered at this time of appointment   If you have labs (blood work) drawn today and your tests are completely normal, you will receive your results only by: [name] MyChart Message (if you have MyChart) OR . A paper copy in the mail If you have any lab test that is abnormal or we need to change your treatment, we will call you to review the results.   Testing/Procedures: [employer] Cardiac Event Monitor  Instructions Your physician has requested you wear your cardiac event monitor for _____ days, (1-30). [employer] may call or text to confirm a shipping address. The monitor will be sent to a land address via UPS. [employer] will not ship a monitor to a PO BOX. It typically takes 3-5 days to receive your monitor after it has been enrolled. [employer] will assist with USPS tracking if your package is delayed. The telephone number  for [employer] is [phone]. Once you have received your monitor, please review the enclosed instructions. Instruction tutorials can also be viewed under help and settings on the enclosed cell phone. Your monitor has already been registered assigning a specific monitor serial # to you.  Applying the monitor Remove cell phone from case and turn it on. The cell phone works as Dealer and needs to be within [employer] of you at all times. The cell phone will need to be charged on a daily basis. We recommend you plug the cell phone into the enclosed charger at your bedside table every night.  Monitor batteries: You will receive two monitor batteries labelled #1 and #2. These are your recorders. Plug battery #2 onto the second connection on the enclosed charger. Keep one battery on the charger at all times. This will keep the monitor battery deactivated. It will also keep it fully charged for when you need to switch your monitor batteries. A small light will be blinking on the battery emblem when it is charging. The light on the battery emblem will remain on when the battery is fully charged.  Open package of a Monitor strip. Insert battery #1 into black hood on strip and gently squeeze monitor battery onto connection as indicated in instruction booklet. Set aside while preparing skin.  Choose location for your strip, vertical or horizontal, as indicated in the instruction booklet. Shave to remove all hair from location. There cannot be any lotions, oils,  powders, or colognes on skin where monitor is to be applied. Wipe skin clean with enclosed Saline wipe. Dry skin completely.  Peel paper labeled #1 off the back of the Monitor strip exposing the adhesive. Place the monitor on the chest in the vertical or horizontal position shown in the instruction booklet. One arrow on the monitor strip must be pointing upward. Carefully remove paper labeled #2, attaching remainder of strip to your skin. Try not to create any folds or wrinkles in the strip as you apply it.  Firmly press and release the circle in the center of the monitor battery. You will hear a small beep. This is turning the monitor battery on. The heart emblem on the monitor battery will light up every 5 seconds if the monitor battery in turned on and connected to the patient securely. Do not push and hold the circle down as this turns the monitor battery off. The cell phone will locate the monitor battery. A screen will appear on the cell phone checking the connection of your monitor strip. This may read poor connection initially but change to good connection within the next minute. Once your monitor accepts the connection you will hear a series of 3 beeps followed by a climbing crescendo of beeps. A screen will appear on the cell phone showing the two monitor strip placement options. Touch the picture that demonstrates where you applied the monitor strip.  Your monitor strip and battery are waterproof. You are able to shower, bathe, or swim with the monitor on. They just ask you do not submerge deeper than 3 feet underwater. We recommend removing the monitor if you are swimming in a lake, river, or ocean.  Your monitor battery will need to be switched to a fully charged monitor battery approximately once a week. The cell phone will alert you of an action which needs to be made.  On the cell phone, tap for details to reveal connection status, monitor battery status, and cell phone  battery status. The  green dots indicates your monitor is in good status. A red dot indicates there is something that needs your attention.  To record a symptom, click the circle on the monitor battery. In 30-60 seconds a list of symptoms will appear on the cell phone. Select your symptom and tap save. Your monitor will record a sustained or significant arrhythmia regardless of you clicking the button. Some patients do not feel the heart rhythm irregularities. [employer] will notify us of any serious or critical events.  Refer to instruction booklet for instructions on switching batteries, changing strips, the Do not disturb or Pause features, or any additional questions.  Call [employer] at [phone], to confirm your monitor is transmitting and record your baseline. They will answer any questions you may have regarding the monitor instructions at that time.  Returning the monitor to [place] all equipment back into blue box. Peel off strip of paper to expose adhesive and close box securely. There is a prepaid UPS shipping label on this box. Drop in a UPS drop box, or at a UPS facility like [employer]. You may also contact [employer] to arrange UPS to pick up monitor package at your home.   Follow-Up: At [facility], you and your health needs are our priority.  As part of our continuing mission to provide you with exceptional heart care, we have created designated Provider Care Teams.  These Care Teams include your primary Cardiologist (physician) and Advanced Practice Providers (APPs -  Physician Assistants and Nurse Practitioners) who all work together to provide you with the care you need, when you need it.  We recommend signing up for the patient portal called "MyChart".  Sign up information is provided on this After Visit Summary.  MyChart is used to connect with patients for Virtual Visits (Telemedicine).  Patients are able to view lab/test results, encounter notes, upcoming  appointments, etc.  Non-urgent messages can be sent to your provider as well.   To learn more about what you can do with MyChart, go to [email or web address].    Your next appointment:   7-8 week(s)  The format for your next appointment:   In Person  Provider:   Almyra Deforest, PA-C  Other Instructions      Signed, Almyra Deforest, [place]  04/17/2020 10:39 AM    [place]

## 2020-04-24 ENCOUNTER — Telehealth: Payer: Self-pay | Admitting: Cardiovascular Disease

## 2020-04-24 NOTE — Telephone Encounter (Signed)
Claudia [name] calling in to check on the status of the monitor that is being mailed to her. She has still not received it. She states she was told she would receive it by 04/22/2020.  Please call/advise  Thank you!

## 2020-04-24 NOTE — Telephone Encounter (Signed)
Per UPS tracking , check tomorrow for updated delivery date.  Patient given tracking number [phone] to check tomorrow.

## 2020-04-28 ENCOUNTER — Other Ambulatory Visit: Payer: Self-pay | Admitting: Internal Medicine

## 2020-04-28 ENCOUNTER — Telehealth: Payer: Self-pay | Admitting: Internal Medicine

## 2020-04-28 DIAGNOSIS — M170 Bilateral primary osteoarthritis of knee: Secondary | ICD-10-CM

## 2020-04-28 DIAGNOSIS — M961 Postlaminectomy syndrome, not elsewhere classified: Secondary | ICD-10-CM

## 2020-04-28 DIAGNOSIS — M47817 Spondylosis without myelopathy or radiculopathy, lumbosacral region: Secondary | ICD-10-CM

## 2020-04-28 MED ORDER — HYDROCODONE-ACETAMINOPHEN 5-325 MG PO TABS: 1.0000 | ORAL_TABLET | Freq: Four times a day (QID) | ORAL | 0 refills | Status: DC | PRN

## 2020-04-28 NOTE — Telephone Encounter (Signed)
Patient is requesting a med refill forHYDROcodone-acetaminophen (NORCO/VICODIN) 5-325 MG tablet It can be sent to  [place], [place]    Please call pt when rx is sent [phone]

## 2020-04-29 ENCOUNTER — Encounter (INDEPENDENT_AMBULATORY_CARE_PROVIDER_SITE_OTHER): Payer: PPO

## 2020-04-29 DIAGNOSIS — R002 Palpitations: Secondary | ICD-10-CM

## 2020-05-01 ENCOUNTER — Ambulatory Visit: Payer: PPO | Admitting: Internal Medicine

## 2020-05-05 ENCOUNTER — Other Ambulatory Visit: Payer: Self-pay

## 2020-05-05 ENCOUNTER — Encounter: Payer: Self-pay | Admitting: Internal Medicine

## 2020-05-05 ENCOUNTER — Ambulatory Visit (INDEPENDENT_AMBULATORY_CARE_PROVIDER_SITE_OTHER): Payer: PPO

## 2020-05-05 ENCOUNTER — Ambulatory Visit (INDEPENDENT_AMBULATORY_CARE_PROVIDER_SITE_OTHER): Payer: PPO | Admitting: Internal Medicine

## 2020-05-05 VITALS — BP 144/68 | HR 63 | Temp 98.5°F | Resp 16 | Ht 70.0 in | Wt 218.0 lb

## 2020-05-05 DIAGNOSIS — K5904 Chronic idiopathic constipation: Secondary | ICD-10-CM

## 2020-05-05 DIAGNOSIS — Z0000 Encounter for general adult medical examination without abnormal findings: Secondary | ICD-10-CM

## 2020-05-05 DIAGNOSIS — M47817 Spondylosis without myelopathy or radiculopathy, lumbosacral region: Secondary | ICD-10-CM | POA: Diagnosis not present

## 2020-05-05 DIAGNOSIS — I10 Essential (primary) hypertension: Secondary | ICD-10-CM

## 2020-05-05 DIAGNOSIS — E7800 Pure hypercholesterolemia, unspecified: Secondary | ICD-10-CM

## 2020-05-05 DIAGNOSIS — E039 Hypothyroidism, unspecified: Secondary | ICD-10-CM | POA: Diagnosis not present

## 2020-05-05 DIAGNOSIS — M170 Bilateral primary osteoarthritis of knee: Secondary | ICD-10-CM | POA: Diagnosis not present

## 2020-05-05 DIAGNOSIS — N1832 Chronic kidney disease, stage 3b: Secondary | ICD-10-CM | POA: Diagnosis not present

## 2020-05-05 LAB — LIPID PANEL
Cholesterol: 138 mg/dL (ref 0–200)
HDL: 52.4 mg/dL (ref >39.00)
LDL Cholesterol: 65 mg/dL (ref 0–99)
NonHDL: 85.42
Total CHOL/HDL Ratio: 3
Triglycerides: 104 mg/dL (ref 0.0–149.0)
VLDL: 20.8 mg/dL (ref 0.0–40.0)

## 2020-05-05 LAB — BASIC METABOLIC PANEL
BUN: 21 mg/dL (ref 6–23)
CO2: 28 mEq/L (ref 19–32)
Calcium: 9.7 mg/dL (ref 8.4–10.5)
Chloride: 106 mEq/L (ref 96–112)
Creatinine, Ser: 1.26 mg/dL — ABNORMAL HIGH (ref 0.40–1.20)
GFR: 39.29 mL/min — ABNORMAL LOW (ref >60.00)
Glucose, Bld: 82 mg/dL (ref 70–99)
Potassium: 4.7 mEq/L (ref 3.5–5.1)
Sodium: 140 mEq/L (ref 135–145)

## 2020-05-05 LAB — CBC WITH DIFFERENTIAL/PLATELET
Basophils Absolute: 0.1 10*3/uL (ref 0.0–0.1)
Basophils Relative: 1.2 % (ref 0.0–3.0)
Eosinophils Absolute: 0.3 10*3/uL (ref 0.0–0.7)
Eosinophils Relative: 5.4 % — ABNORMAL HIGH (ref 0.0–5.0)
HCT: 37.4 % (ref 36.0–46.0)
Hemoglobin: 12.2 g/dL (ref 12.0–15.0)
Lymphocytes Relative: 33.6 % (ref 12.0–46.0)
Lymphs Abs: 1.7 10*3/uL (ref 0.7–4.0)
MCHC: 32.7 g/dL (ref 30.0–36.0)
MCV: 85.8 fl (ref 78.0–100.0)
Monocytes Absolute: 0.5 10*3/uL (ref 0.1–1.0)
Monocytes Relative: 9.7 % (ref 3.0–12.0)
Neutro Abs: 2.5 10*3/uL (ref 1.4–7.7)
Neutrophils Relative %: 50.1 % (ref 43.0–77.0)
Platelets: 216 10*3/uL (ref 150.0–400.0)
RBC: 4.36 Mil/uL (ref 3.87–5.11)
RDW: 14.2 % (ref 11.5–15.5)
WBC: 5 10*3/uL (ref 4.0–10.5)

## 2020-05-05 LAB — HEPATIC FUNCTION PANEL
ALT: 9 U/L (ref 0–35)
AST: 15 U/L (ref 0–37)
Albumin: 4.1 g/dL (ref 3.5–5.2)
Alkaline Phosphatase: 65 U/L (ref 39–117)
Bilirubin, Direct: 0.2 mg/dL (ref 0.0–0.3)
Total Bilirubin: 0.7 mg/dL (ref 0.2–1.2)
Total Protein: 6.7 g/dL (ref 6.0–8.3)

## 2020-05-05 LAB — TSH: TSH: 3.96 u[IU]/mL (ref 0.35–4.50)

## 2020-05-05 MED ORDER — INDAPAMIDE 1.25 MG PO TABS: 1.2500 mg | ORAL_TABLET | Freq: Every day | ORAL | 1 refills | Status: DC

## 2020-05-05 NOTE — Progress Notes (Signed)
Subjective:  Patient ID: Claudia [name], female    DOB: [birth year]  Age: 86 y.o. MRN: [id number]  CC: Annual Exam, Hypertension, and Hyperlipidemia  This visit occurred during the SARS-CoV-2 public health emergency.  Safety protocols were in place, including screening questions prior to the visit, additional usage of staff PPE, and extensive cleaning of exam room while observing appropriate contact time as indicated for disinfecting solutions.    HPI Claudia [name] presents for a CPX.  She continues to complain of low back pain.  She feels like her range of motion and gait are worsening.  She is getting symptom relief with hydrocodone and acetaminophen.  She does not want to consider surgery until the first of the year.  She says the back pain has not recently been radiating and she denies lower extremity paresthesias.  She complains of chronic constipation but tells me she is getting adequate symptom relief with linaclotide.  She is compliant with the ARB and thiazide diuretic. She reports that her SBP was recently 112. She has had a few episodes of dizziness but she denies chest pain, diaphoresis, lightheadedness, palpitations, or fatigue.  Outpatient Medications Prior to Visit  Medication Sig Dispense Refill  . cetirizine (ZYRTEC) 10 MG tablet Take 1 tablet (10 mg total) by mouth daily. 90 tablet 1  . HYDROcodone-acetaminophen (NORCO/VICODIN) 5-325 MG tablet Take 1 tablet by mouth every 6 (six) hours as needed for moderate pain. 90 tablet 0  . linaclotide (LINZESS) 290 MCG CAPS capsule TAKE 1 CAPSULE BY MOUTH DAILY BEFORE BREAKFAST 90 capsule 1  . rosuvastatin (CRESTOR) 10 MG tablet Take 1 tablet (10 mg total) by mouth daily. 90 tablet 3  . vitamin C (ASCORBIC ACID) 500 MG tablet Take 500 mg by mouth daily.    [name] telmisartan-hydrochlorothiazide (MICARDIS HCT) 80-12.5 MG tablet Take 1 tablet by mouth daily. 90 tablet 1   No facility-administered medications prior to visit.     ROS Review of Systems  Constitutional: Negative.  Negative for appetite change, diaphoresis, fatigue and unexpected weight change.  HENT: Negative.  Negative for sore throat and trouble swallowing.   Eyes: Negative.   Respiratory: Negative for cough, chest tightness, shortness of breath and wheezing.   Cardiovascular: Positive for leg swelling. Negative for chest pain and palpitations.  Gastrointestinal: Positive for constipation. Negative for abdominal pain, diarrhea and vomiting.  Endocrine: Negative.   Genitourinary: Negative.  Negative for difficulty urinating.  Musculoskeletal: Positive for arthralgias, back pain and gait problem. Negative for myalgias and neck pain.  Neurological: Negative for dizziness, weakness, light-headedness and headaches.  Hematological: Negative for adenopathy. Does not bruise/bleed easily.  Psychiatric/Behavioral: Negative.     Objective:  BP (!) 144/68   Pulse 63   Temp 98.5 F (36.9 C) (Oral)   Resp 16   Ht 5\' 10"  (1.778 m)   Wt 218 lb (98.9 kg)   SpO2 93%   BMI 31.28 kg/m   BP Readings from Last 3 Encounters:  05/05/20 (!) 144/68  05/05/20 (!) 144/68  04/17/20 (!) 149/79    Wt Readings from Last 3 Encounters:  05/05/20 218 lb (98.9 kg)  05/05/20 218 lb (98.9 kg)  04/17/20 219 lb (99.3 kg)    Physical Exam Vitals reviewed.  HENT:     Nose: Nose normal.     Mouth/Throat:     Mouth: Mucous membranes are moist.  Eyes:     General: No scleral icterus.    Conjunctiva/sclera: Conjunctivae normal.  Cardiovascular:  Rate and Rhythm: Normal rate and regular rhythm.     Heart sounds: No murmur heard.   Pulmonary:     Effort: Pulmonary effort is normal.     Breath sounds: No stridor. No wheezing, rhonchi or rales.  Abdominal:     General: Abdomen is protuberant. Bowel sounds are normal.     Palpations: There is no hepatomegaly, splenomegaly or mass.     Tenderness: There is no abdominal tenderness.     Hernia: No hernia is  present.  Musculoskeletal:     Cervical back: Neck supple.     Lumbar back: No bony tenderness. Decreased range of motion. Negative right straight leg raise test and negative left straight leg raise test.     Right lower leg: Edema (trace pitting) present.     Left lower leg: Edema (trace pitting) present.  Lymphadenopathy:     Cervical: No cervical adenopathy.  Skin:    General: Skin is warm and dry.  Neurological:     General: No focal deficit present.     Mental Status: She is alert and oriented to person, place, and time. Mental status is at baseline.     Cranial Nerves: Cranial nerves are intact.     Sensory: Sensation is intact.     Coordination: Coordination abnormal.  Psychiatric:        Mood and Affect: Mood normal.        Behavior: Behavior normal.     Lab Results  Component Value Date   WBC 5.0 05/05/2020   HGB 12.2 05/05/2020   HCT 37.4 05/05/2020   PLT 216.0 05/05/2020   GLUCOSE 82 05/05/2020   CHOL 138 05/05/2020   TRIG 104.0 05/05/2020   HDL 52.40 05/05/2020   LDLDIRECT 158.9 09/05/2012   LDLCALC 65 05/05/2020   ALT 9 05/05/2020   AST 15 05/05/2020   NA 140 05/05/2020   K 4.7 05/05/2020   CL 106 05/05/2020   CREATININE 1.26 (H) 05/05/2020   BUN 21 05/05/2020   CO2 28 05/05/2020   TSH 3.96 05/05/2020   INR 1.00 04/04/2017   HGBA1C 5.8 (H) 08/24/2015    DG Lumbar Spine Complete  Result Date: 12/25/2018 CLINICAL DATA:  Chronic low back pain for 2 weeks, no known injury, initial encounter EXAM: LUMBAR SPINE - COMPLETE 4+ VIEW COMPARISON:  05/03/2011 FINDINGS: There are changes could consistent with prior interbody fusion at L4-5 with posterior fixation and pedicle screws. The overall appearance is stable from the prior exam. Mild scoliosis of the mid lumbar spine concave to the right is again seen and stable. Mild disc space narrowing at L2-3 with associated osteophytic changes are seen and stable. No soft tissue abnormality is noted. IMPRESSION:  Postoperative and degenerative changes without acute abnormality. Electronically Signed   By: Inez Catalina M.D.   On: 12/25/2018 10:57    Assessment & Plan:   Amarachi was seen today for annual exam, hypertension and hyperlipidemia.  Diagnoses and all orders for this visit:  Essential hypertension, benign- She complains of dizziness and has had a slight decline in her renal function.  I am concerned her blood pressure is overcontrolled.  I recommended that she stop taking the ARB and to control her blood pressure with indapamide. -     indapamide (LOZOL) 1.25 MG tablet; Take 1 tablet (1.25 mg total) by mouth daily. -     CBC with Differential/Platelet; Future -     Basic metabolic panel; Future -     Basic  metabolic panel -     CBC with Differential/Platelet  Acquired hypothyroidism- Her TSH is in the normal range.  She will remain on the current dose of levothyroxine. -     TSH; Future -     TSH  Routine general medical examination at a health care facility- Exam completed, labs reviewed, vaccines reviewed and updated, no cancer screenings are indicated, patient education was given.  Pure hypercholesterolemia- She has achieved her LDL goal is doing well on the statin. -     Lipid panel; Future -     TSH; Future -     Hepatic function panel; Future -     Hepatic function panel -     TSH -     Lipid panel  Stage 3b chronic kidney disease ([place])- Her blood pressure is adequately well controlled.  Will discontinue the ARB.  She will avoid nephrotoxic agents.  Chronic idiopathic constipation- She is doing well on linaclotide.  Will continue.  I gave her samples today.  Lumbosacral spondylosis without myelopathy- Her symptoms are concerning for worsening disease but she is not ready to have surgery yet.  She will continue to control the pain with hydrocodone and acetaminophen.  Primary osteoarthritis of both knees- Will continue to control the pain with hydrocodone and  acetaminophen.   I have discontinued Yamili Lichtenwalner. Reichelt's telmisartan-hydrochlorothiazide. I am also having her start on indapamide. Additionally, I am having her maintain her vitamin C, cetirizine, linaclotide, rosuvastatin, and HYDROcodone-acetaminophen.  Meds ordered this encounter  Medications  . indapamide (LOZOL) 1.25 MG tablet    Sig: Take 1 tablet (1.25 mg total) by mouth daily.    Dispense:  90 tablet    Refill:  1   In addition to time spent on CPE, I spent 50 minutes in preparing to see the patient by review of recent labs, imaging and procedures, obtaining and reviewing separately obtained history, communicating with the patient and family or caregiver, ordering medications, tests or procedures, and documenting clinical information in the EHR including the differential Dx, treatment, and any further evaluation and other management of 1. Essential hypertension, benign 2. Acquired hypothyroidism 3. Pure hypercholesterolemia 4. Stage 3b chronic kidney disease ([place]) 5. Chronic idiopathic constipation 6. Lumbosacral spondylosis without myelopathy 7. Primary osteoarthritis of both knees     Follow-up: Return in about 6 months (around 11/02/2020).  [name], MD

## 2020-05-05 NOTE — Patient Instructions (Signed)
Health Maintenance, Female Adopting a healthy lifestyle and getting preventive care are important in promoting health and wellness. Ask your health care provider about:  The right schedule for you to have regular tests and exams.  Things you can do on your own to prevent diseases and keep yourself healthy. What should I know about diet, weight, and exercise? Eat a healthy diet   Eat a diet that includes plenty of vegetables, fruits, low-fat dairy products, and lean protein.  Do not eat a lot of foods that are high in solid fats, added sugars, or sodium. Maintain a healthy weight Body mass index (BMI) is used to identify weight problems. It estimates body fat based on height and weight. Your health care provider can help determine your BMI and help you achieve or maintain a healthy weight. Get regular exercise Get regular exercise. This is one of the most important things you can do for your health. Most adults should:  Exercise for at least 150 minutes each week. The exercise should increase your heart rate and make you sweat (moderate-intensity exercise).  Do strengthening exercises at least twice a week. This is in addition to the moderate-intensity exercise.  Spend less time sitting. Even light physical activity can be beneficial. Watch cholesterol and blood lipids Have your blood tested for lipids and cholesterol at 84 years of age, then have this test every 5 years. Have your cholesterol levels checked more often if:  Your lipid or cholesterol levels are high.  You are older than 84 years of age.  You are at high risk for heart disease. What should I know about cancer screening? Depending on your health history and family history, you may need to have cancer screening at various ages. This may include screening for:  Breast cancer.  Cervical cancer.  Colorectal cancer.  Skin cancer.  Lung cancer. What should I know about heart disease, diabetes, and high blood  pressure? Blood pressure and heart disease  High blood pressure causes heart disease and increases the risk of stroke. This is more likely to develop in people who have high blood pressure readings, are of African descent, or are overweight.  Have your blood pressure checked: ? Every 3-5 years if you are 18-39 years of age. ? Every year if you are 40 years old or older. Diabetes Have regular diabetes screenings. This checks your fasting blood sugar level. Have the screening done:  Once every three years after age 40 if you are at a normal weight and have a low risk for diabetes.  More often and at a younger age if you are overweight or have a high risk for diabetes. What should I know about preventing infection? Hepatitis B If you have a higher risk for hepatitis B, you should be screened for this virus. Talk with your health care provider to find out if you are at risk for hepatitis B infection. Hepatitis C Testing is recommended for:  Everyone born from 1945 through 1965.  Anyone with known risk factors for hepatitis C. Sexually transmitted infections (STIs)  Get screened for STIs, including gonorrhea and chlamydia, if: ? You are sexually active and are younger than 84 years of age. ? You are older than 84 years of age and your health care provider tells you that you are at risk for this type of infection. ? Your sexual activity has changed since you were last screened, and you are at increased risk for chlamydia or gonorrhea. Ask your health care provider if   you are at risk.  Ask your health care provider about whether you are at high risk for HIV. Your health care provider may recommend a prescription medicine to help prevent HIV infection. If you choose to take medicine to prevent HIV, you should first get tested for HIV. You should then be tested every 3 months for as long as you are taking the medicine. Pregnancy  If you are about to stop having your period (premenopausal) and  you may become pregnant, seek counseling before you get pregnant.  Take 400 to 800 micrograms (mcg) of folic acid every day if you become pregnant.  Ask for birth control (contraception) if you want to prevent pregnancy. Osteoporosis and menopause Osteoporosis is a disease in which the bones lose minerals and strength with aging. This can result in bone fractures. If you are 65 years old or older, or if you are at risk for osteoporosis and fractures, ask your health care provider if you should:  Be screened for bone loss.  Take a calcium or vitamin D supplement to lower your risk of fractures.  Be given hormone replacement therapy (HRT) to treat symptoms of menopause. Follow these instructions at home: Lifestyle  Do not use any products that contain nicotine or tobacco, such as cigarettes, e-cigarettes, and chewing tobacco. If you need help quitting, ask your health care provider.  Do not use street drugs.  Do not share needles.  Ask your health care provider for help if you need support or information about quitting drugs. Alcohol use  Do not drink alcohol if: ? Your health care provider tells you not to drink. ? You are pregnant, may be pregnant, or are planning to become pregnant.  If you drink alcohol: ? Limit how much you use to 0-1 drink a day. ? Limit intake if you are breastfeeding.  Be aware of how much alcohol is in your drink. In the [place] one drink equals one 12 oz bottle of beer (355 mL), one 5 oz glass of wine (148 mL), or one 1 oz glass of hard liquor (44 mL). General instructions  Schedule regular health, dental, and eye exams.  Stay current with your vaccines.  Tell your health care provider if: ? You often feel depressed. ? You have ever been abused or do not feel safe at home. Summary  Adopting a healthy lifestyle and getting preventive care are important in promoting health and wellness.  Follow your health care provider's instructions about healthy  diet, exercising, and getting tested or screened for diseases.  Follow your health care provider's instructions on monitoring your cholesterol and blood pressure. This information is not intended to replace advice given to you by your health care provider. Make sure you discuss any questions you have with your health care provider. Document Revised: 06/07/2018 Document Reviewed: 06/07/2018 [employer] Patient Education  2020 [employer]  

## 2020-05-05 NOTE — Patient Instructions (Signed)
Claudia [name] , Thank you for taking time to come for your Medicare Wellness Visit. I appreciate your ongoing commitment to your health goals. Please review the following plan we discussed and let me know if I can assist you in the future.   Screening recommendations/referrals: Colonoscopy: not a candidate for colon caner screening due to age Mammogram: 10/09/2015 Bone Density: 06/10/2014 Recommended yearly ophthalmology/optometry visit for glaucoma screening and checkup Recommended yearly dental visit for hygiene and checkup  Vaccinations: Influenza vaccine: 04/15/2020 Pneumococcal vaccine: up to date Tdap vaccine: 08/23/2017 Shingles vaccine: up to date   Covid-19: up to date  Advanced directives: Please bring a copy of your health care power of attorney and living will to the office at your convenience.  Conditions/risks identified: Yes; Reviewed health maintenance screenings with patient today and relevant education, vaccines, and/or referrals were provided. Please continue to do your personal lifestyle choices by: daily care of teeth and gums, regular physical activity (goal should be 5 days a week for 30 minutes), eat a healthy diet, avoid tobacco and drug use, limiting any alcohol intake, taking a low-dose aspirin (if not allergic or have been advised by your provider otherwise) and taking vitamins and minerals as recommended by your provider. Continue doing brain stimulating activities (puzzles, reading, adult coloring books, staying active) to keep memory sharp. Continue to eat heart healthy diet (full of fruits, vegetables, whole grains, lean protein, water--limit salt, fat, and sugar intake) and increase physical activity as tolerated.  Next appointment: Please schedule your next Medicare Wellness Visit with your Nurse Health Advisor in 1 year by calling [phone].   Preventive Care 17 Years and Older, Female Preventive care refers to lifestyle choices and visits with your health  care provider that can promote health and wellness. What does preventive care include?  A yearly physical exam. This is also called an annual well check.  Dental exams once or twice a year.  Routine eye exams. Ask your health care provider how often you should have your eyes checked.  Personal lifestyle choices, including:  Daily care of your teeth and gums.  Regular physical activity.  Eating a healthy diet.  Avoiding tobacco and drug use.  Limiting alcohol use.  Practicing safe sex.  Taking low-dose aspirin every day.  Taking vitamin and mineral supplements as recommended by your health care provider. What happens during an annual well check? The services and screenings done by your health care provider during your annual well check will depend on your age, overall health, lifestyle risk factors, and family history of disease. Counseling  Your health care provider may ask you questions about your:  Alcohol use.  Tobacco use.  Drug use.  Emotional well-being.  Home and relationship well-being.  Sexual activity.  Eating habits.  History of falls.  Memory and ability to understand (cognition).  Work and work Statistician.  Reproductive health. Screening  You may have the following tests or measurements:  Height, weight, and BMI.  Blood pressure.  Lipid and cholesterol levels. These may be checked every 5 years, or more frequently if you are over 55 years old.  Skin check.  Lung cancer screening. You may have this screening every year starting at age 64 if you have a 30-pack-year history of smoking and currently smoke or have quit within the past 15 years.  Fecal occult blood test (FOBT) of the stool. You may have this test every year starting at age 37.  Flexible sigmoidoscopy or colonoscopy. You may have a sigmoidoscopy  every 5 years or a colonoscopy every 10 years starting at age 91.  Hepatitis C blood test.  Hepatitis B blood test.  Sexually  transmitted disease (STD) testing.  Diabetes screening. This is done by checking your blood sugar (glucose) after you have not eaten for a while (fasting). You may have this done every 1-3 years.  Bone density scan. This is done to screen for osteoporosis. You may have this done starting at age 70.  Mammogram. This may be done every 1-2 years. Talk to your health care provider about how often you should have regular mammograms. Talk with your health care provider about your test results, treatment options, and if necessary, the need for more tests. Vaccines  Your health care provider may recommend certain vaccines, such as:  Influenza vaccine. This is recommended every year.  Tetanus, diphtheria, and acellular pertussis (Tdap, Td) vaccine. You may need a Td booster every 10 years.  Zoster vaccine. You may need this after age 35.  Pneumococcal 13-valent conjugate (PCV13) vaccine. One dose is recommended after age 76.  Pneumococcal polysaccharide (PPSV23) vaccine. One dose is recommended after age 77. Talk to your health care provider about which screenings and vaccines you need and how often you need them. This information is not intended to replace advice given to you by your health care provider. Make sure you discuss any questions you have with your health care provider. Document Released: 07/11/2015 Document Revised: 03/03/2016 Document Reviewed: 04/15/2015 Elsevier Interactive Patient Education  2017 [employer] Prevention in the Home Falls can cause injuries. They can happen to people of all ages. There are many things you can do to make your home safe and to help prevent falls. What can I do on the outside of my home?  Regularly fix the edges of walkways and driveways and fix any cracks.  Remove anything that might make you trip as you walk through a door, such as a raised step or threshold.  Trim any bushes or trees on the path to your home.  Use bright outdoor  lighting.  Clear any walking paths of anything that might make someone trip, such as rocks or tools.  Regularly check to see if handrails are loose or broken. Make sure that both sides of any steps have handrails.  Any raised decks and porches should have guardrails on the edges.  Have any leaves, snow, or ice cleared regularly.  Use sand or salt on walking paths during winter.  Clean up any spills in your garage right away. This includes oil or grease spills. What can I do in the bathroom?  Use night lights.  Install grab bars by the toilet and in the tub and shower. Do not use towel bars as grab bars.  Use non-skid mats or decals in the tub or shower.  If you need to sit down in the shower, use a plastic, non-slip stool.  Keep the floor dry. Clean up any water that spills on the floor as soon as it happens.  Remove soap buildup in the tub or shower regularly.  Attach bath mats securely with double-sided non-slip rug tape.  Do not have throw rugs and other things on the floor that can make you trip. What can I do in the bedroom?  Use night lights.  Make sure that you have a light by your bed that is easy to reach.  Do not use any sheets or blankets that are too big for your bed. They should  not hang down onto the floor.  Have a firm chair that has side arms. You can use this for support while you get dressed.  Do not have throw rugs and other things on the floor that can make you trip. What can I do in the kitchen?  Clean up any spills right away.  Avoid walking on wet floors.  Keep items that you use a lot in easy-to-reach places.  If you need to reach something above you, use a strong step stool that has a grab bar.  Keep electrical cords out of the way.  Do not use floor polish or wax that makes floors slippery. If you must use wax, use non-skid floor wax.  Do not have throw rugs and other things on the floor that can make you trip. What can I do with my  stairs?  Do not leave any items on the stairs.  Make sure that there are handrails on both sides of the stairs and use them. Fix handrails that are broken or loose. Make sure that handrails are as long as the stairways.  Check any carpeting to make sure that it is firmly attached to the stairs. Fix any carpet that is loose or worn.  Avoid having throw rugs at the top or bottom of the stairs. If you do have throw rugs, attach them to the floor with carpet tape.  Make sure that you have a light switch at the top of the stairs and the bottom of the stairs. If you do not have them, ask someone to add them for you. What else can I do to help prevent falls?  Wear shoes that:  Do not have high heels.  Have rubber bottoms.  Are comfortable and fit you well.  Are closed at the toe. Do not wear sandals.  If you use a stepladder:  Make sure that it is fully opened. Do not climb a closed stepladder.  Make sure that both sides of the stepladder are locked into place.  Ask someone to hold it for you, if possible.  Clearly mark and make sure that you can see:  Any grab bars or handrails.  First and last steps.  Where the edge of each step is.  Use tools that help you move around (mobility aids) if they are needed. These include:  Canes.  Walkers.  Scooters.  Crutches.  Turn on the lights when you go into a dark area. Replace any light bulbs as soon as they burn out.  Set up your furniture so you have a clear path. Avoid moving your furniture around.  If any of your floors are uneven, fix them.  If there are any pets around you, be aware of where they are.  Review your medicines with your doctor. Some medicines can make you feel dizzy. This can increase your chance of falling. Ask your doctor what other things that you can do to help prevent falls. This information is not intended to replace advice given to you by your health care provider. Make sure you discuss any  questions you have with your health care provider. Document Released: 04/10/2009 Document Revised: 11/20/2015 Document Reviewed: 07/19/2014 Elsevier Interactive Patient Education  2017 [employer].

## 2020-05-05 NOTE — Progress Notes (Signed)
Subjective:   Claudia [name] is a 86 y.o. female who presents for Medicare Annual (Subsequent) preventive examination.  Review of Systems    No ROS. Medicare Wellness Visit. Additional risk factors are reflected in social history. Cardiac Risk Factors include: advanced age (>53men, >42 women);dyslipidemia;hypertension;obesity (BMI >30kg/m2);family history of premature cardiovascular disease Sleep Patterns: No sleep issues, feels rested on waking and sleeps 8 hours nightly. Home Safety/Smoke Alarms: Feels safe in home; uses home alarm. Smoke alarms in place. Living environment: 1-story home; Lives with spouse; no needs for DME; good support system. Seat Belt Safety/Bike Helmet: Wears seat belt.    Objective:    Today's Vitals   05/05/20 1407  BP: (!) 144/68  Pulse: 63  Resp: 16  Temp: 98.5 F (36.9 C)  SpO2: 93%  Weight: 218 lb (98.9 kg)  Height: 5\' 10"  (1.778 m)  PainSc: 0-No pain   Body mass index is 31.28 kg/m.  Advanced Directives 05/05/2020 05/06/2016 05/03/2016 09/03/2015 08/23/2015 08/23/2015  Does Patient Have a Medical Advance Directive? Yes No No - Yes Yes  Type of Advance Directive Living will;Healthcare Power of [place];Living will [place];Living will [place];Living will  Does patient want to make changes to medical advance directive? No - Patient declined - - No - Patient declined - No - Patient declined  Copy of [place] in Chart? No - copy requested - - Yes No - copy requested No - copy requested  Would patient like information on creating a medical advance directive? - No - patient declined information No - patient declined information - - -    Current Medications (verified) Outpatient Encounter Medications as of 05/05/2020  Medication Sig  . cetirizine (ZYRTEC) 10 MG tablet Take 1 tablet (10 mg total) by mouth daily.  [name] HYDROcodone-acetaminophen (NORCO/VICODIN)  5-325 MG tablet Take 1 tablet by mouth every 6 (six) hours as needed for moderate pain.  . indapamide (LOZOL) 1.25 MG tablet Take 1 tablet (1.25 mg total) by mouth daily.  [name] linaclotide (LINZESS) 290 MCG CAPS capsule TAKE 1 CAPSULE BY MOUTH DAILY BEFORE BREAKFAST  . rosuvastatin (CRESTOR) 10 MG tablet Take 1 tablet (10 mg total) by mouth daily.  . vitamin C (ASCORBIC ACID) 500 MG tablet Take 500 mg by mouth daily.   No facility-administered encounter medications on file as of 05/05/2020.    Allergies (verified) Lipitor [atorvastatin] and Trileptal [oxcarbazepine]   History: Past Medical History:  Diagnosis Date  . Abnormality of gait   . Brachial neuritis or radiculitis NOS   . Degeneration of lumbar or lumbosacral intervertebral disc   . Essential hypertension, benign   . Gouty arthropathy   . Lumbago   . Obesity   . Osteoarthritis   . Pure hypercholesterolemia   . Unspecified hypothyroidism    Past Surgical History:  Procedure Laterality Date  . ABDOMINAL HYSTERECTOMY  03/13/2010  . BACK SURGERY    . LUMBAR LAMINECTOMY  03/13/2010  . right knee replacement  03/13/2010   Family History  Problem Relation Age of Onset  . Heart disease Father   . Heart attack Father   . Hypertension Mother   . Alzheimer's disease Mother   . Diabetes Sister   . Cirrhosis Brother   . Diabetes Sister   . Diabetes Sister   . Diabetes Brother   . Heart attack Brother   . Cancer Neg Hx   . Kidney disease Neg Hx  Social History   Socioeconomic History  . Marital status: Married    Spouse name: Not on file  . Number of children: Not on file  . Years of education: Not on file  . Highest education level: Not on file  Occupational History  . Not on file  Tobacco Use  . Smoking status: Never Smoker  . Smokeless tobacco: Never Used  Substance and Sexual Activity  . Alcohol use: No    Comment: social  . Drug use: No  . Sexual activity: Never  Other Topics Concern  . Not on file    Social History Narrative  . Not on file   Social Determinants of Health   Financial Resource Strain: Low Risk   . Difficulty of Paying Living Expenses: Not hard at all  Food Insecurity: No Food Insecurity  . Worried About Charity fundraiser in the Last Year: Never true  . Ran Out of Food in the Last Year: Never true  Transportation Needs: No Transportation Needs  . Lack of Transportation (Medical): No  . Lack of Transportation (Non-Medical): No  Physical Activity: Inactive  . Days of Exercise per Week: 0 days  . Minutes of Exercise per Session: 0 min  Stress: No Stress Concern Present  . Feeling of Stress : Not at all  Social Connections: Moderately Integrated  . Frequency of Communication with Friends and Family: More than three times a week  . Frequency of Social Gatherings with Friends and Family: Never  . Attends Religious Services: 1 to 4 times per year  . Active Member of Clubs or Organizations: No  . Attends Archivist Meetings: Never  . Marital Status: Married    Tobacco Counseling Counseling given: Not Answered   Clinical Intake:  Pre-visit preparation completed: Yes  Pain : No/denies pain Pain Score: 0-No pain     BMI - recorded: 31.28 Nutritional Status: BMI > 30  Obese Nutritional Risks: None Diabetes: No  How often do you need to have someone help you when you read instructions, pamphlets, or other written materials from your doctor or pharmacy?: 1 - Never What is the last grade level you completed in school?: HSG  Diabetic? no  Interpreter Needed?: No  Information entered by :: Lisette Abu, LPN   Activities of Daily Living In your present state of health, do you have any difficulty performing the following activities: 05/05/2020  Hearing? N  Vision? N  Difficulty concentrating or making decisions? N  Walking or climbing stairs? Y  Dressing or bathing? N  Doing errands, shopping? N  Preparing Food and eating ? N  Using the  Toilet? N  In the past six months, have you accidently leaked urine? N  Do you have problems with loss of bowel control? N  Managing your Medications? N  Managing your Finances? N  Housekeeping or managing your Housekeeping? N  Some recent data might be hidden    Patient Care Team: Janith Lima, MD as PCP - General (Internal Medicine) Skeet Latch, MD as PCP - Cardiology (Cardiology) Phylliss Bob, MD as Consulting Physician (Orthopedic Surgery)  Indicate any recent Medical Services you may have received from other than [facility] providers in the past year (date may be approximate).     Assessment:   This is a routine wellness examination for [place].  Hearing/Vision screen No exam data present  Dietary issues and exercise activities discussed: Current Exercise Habits: The patient does not participate in regular exercise at present, Exercise limited  by: orthopedic condition(s)  Goals    . Weight < 200 lb (90.719 kg)      Mediterranean Diet: eating primarily plant-based food such as fruits and vegetables, whole grains, legumes and nuts; replacing butter with healthy fats such as olive oil and canola oil Using herbs and spices instead of salt to flavor food Limiting red meat to no more than a few times a month Eating fish and poultry at least 2 times a week Getting plenty of exercise  High Fructose corn Syrup;   [email or web address]        Depression Screen PHQ 2/9 Scores 05/05/2020 06/13/2018 04/19/2017 09/03/2015 05/13/2015 07/09/2014 01/05/2013  PHQ - 2 Score 0 0 0 0 0 0 0    Fall Risk Fall Risk  05/05/2020 05/18/2019 06/13/2018 04/19/2017 09/03/2015  Falls in the past year? 0 0 0 No No  Comment - Emmi Telephone Survey: data to providers prior to load - - -  Number falls in past yr: 0 - 0 - -  Injury with Fall? 0 - 0 - -  Follow up - - Falls evaluation completed - -    Any stairs in or around the home? No  If so, are there  any without handrails? No  Home free of loose throw rugs in walkways, pet beds, electrical cords, etc? Yes  Adequate lighting in your home to reduce risk of falls? Yes   ASSISTIVE DEVICES UTILIZED TO PREVENT FALLS:  Life alert? No  Use of a cane, walker or w/c? Yes  Grab bars in the bathroom? Yes  Shower chair or bench in shower? Yes  Elevated toilet seat or a handicapped toilet? Yes   TIMED UP AND GO:  Was the test performed? No .  Length of time to ambulate 10 feet: 0 sec.   Gait steady and fast with assistive device  Cognitive Function: MMSE - Mini Mental State Exam 05/13/2015  Not completed: (No Data)     6CIT Screen 05/05/2020  What Year? 0 points  What month? 0 points  What time? 0 points  Count back from 20 0 points  Months in reverse 0 points  Repeat phrase 0 points  Total Score 0    Immunizations Immunization History  Administered Date(s) Administered  . DTaP 01/01/2008  . Fluad Quad(high Dose 65+) 03/30/2019  . Influenza Split 04/06/2012  . Influenza Whole 05/04/2011  . Influenza, High Dose Seasonal PF 04/17/2013, 03/04/2016, 04/19/2017, 04/07/2018  . Influenza,inj,Quad PF,6+ Mos 04/11/2014, 04/14/2015  . Influenza-Unspecified 04/15/2020  . PFIZER SARS-COV-2 Vaccination 07/30/2019, 08/25/2019, 03/15/2020  . Pneumococcal Conjugate-13 04/02/2008, 10/08/2013  . Pneumococcal Polysaccharide-23 09/02/2015  . Tdap 01/01/2008, 08/23/2017  . Zoster 08/03/2006  . Zoster Recombinat (Shingrix) 04/02/2017    TDAP status: Up to date Flu Vaccine status: Up to date Pneumococcal vaccine status: Up to date Covid-19 vaccine status: Completed vaccines  Qualifies for Shingles Vaccine? Yes   Zostavax completed Yes   Shingrix Completed?: Yes  Screening Tests Health Maintenance  Topic Date Due  . TETANUS/TDAP  08/24/2027  . INFLUENZA VACCINE  Completed  . DEXA SCAN  Completed  . COVID-19 Vaccine  Completed  . PNA vac Low Risk Adult  Completed    Health  Maintenance  There are no preventive care reminders to display for this patient.  Colorectal cancer screening: No longer required.  Mammogram status: No longer required.  Bone Density status: Completed 06/10/2014. Results reflect: Bone density results: NORMAL. Repeat every 5 years.  Lung Cancer Screening: (Low Dose  CT Chest recommended if Age 16-80 years, 30 pack-year currently smoking OR have quit w/in 15years.) does not qualify.   Lung Cancer Screening Referral: no  Additional Screening:  Hepatitis C Screening: does not qualify; Completed no  Vision Screening: Recommended annual ophthalmology exams for early detection of glaucoma and other disorders of the eye. Is the patient up to date with their annual eye exam?  Yes  Who is the provider or what is the name of the office in which the patient attends annual eye exams? Julian Reil, MD If pt is not established with a provider, would they like to be referred to a provider to establish care? No .   Dental Screening: Recommended annual dental exams for proper oral hygiene  Community Resource Referral / Chronic Care Management: CRR required this visit?  No   CCM required this visit?  No      Plan:     I have personally reviewed and noted the following in the patient's chart:   . Medical and social history . Use of alcohol, tobacco or illicit drugs  . Current medications and supplements . Functional ability and status . Nutritional status . Physical activity . Advanced directives . List of other physicians . Hospitalizations, surgeries, and ER visits in previous 12 months . Vitals . Screenings to include cognitive, depression, and falls . Referrals and appointments  In addition, I have reviewed and discussed with patient certain preventive protocols, quality metrics, and best practice recommendations. A written personalized care plan for preventive services as well as general preventive health recommendations were  provided to patient.     Sheral Flow, LPN   73/09/1933   Nurse Notes: n/a

## 2020-05-06 ENCOUNTER — Encounter: Payer: Self-pay | Admitting: Internal Medicine

## 2020-05-06 DIAGNOSIS — N1832 Chronic kidney disease, stage 3b: Secondary | ICD-10-CM | POA: Insufficient documentation

## 2020-05-10 ENCOUNTER — Telehealth: Payer: Self-pay | Admitting: Physician Assistant

## 2020-05-10 NOTE — Telephone Encounter (Addendum)
86 year old female with hypertension and prior bradycardia.  She has been kept off of AV nodal blocking agents due to history of bradycardia.  She was recently seen for tachypalpitations.  She had an event monitor placed.  I was contacted today by [employer] for a fast rhythm noted on her monitor.  It was reported that the patient appeared to go into ventricular tachycardia initially with a heart rate in the 180s that progressed into atrial fibrillation with rapid ventricular rate (HR 180s).  Strips were sent to me via email.  I have reviewed those.  Her rhythm is very regular and I question whether or not this is SVT (possibly AVRT versus AVNRT).  I called the patient.  She was symptomatic with this earlier today.  She currently feels fine and is no longer having symptoms.  [facility] did notify me that her follow-up rhythm was normal sinus rhythm.  I reviewed her monitor strips with Dr. Rayann Heman (EP).  It appears her rhythm starts with PACs and progresses into probable ATach.  This gets faster with probable aberrancy (not VTach).  She then has a narrow QRS tachycardia that is fairly regular.  It may be SVT but AFib is not completely ruled out.    I contacted [employer] back regarding the duration of her arrhythmia.  She had tachycardic episodes off and on over a 50 min period.  The shortest duration was 5-10 seconds and the longest was 120 seconds.    I have not made any changes to her medications.  Given her hx of bradycardia, I have not sent in prn beta-blocker.  Her CHA2DS2-VASc Score = 4 [CHF History: 0, HTN History: 1, Diabetes History: 0, Stroke History: 0, Vascular Disease History: 0, Age Score: 2, Gender Score: 1].  Therefore, the patient's annual risk of stroke is 4.8 %.   As it is not entirely clear that this was AFib, I have not started her on anticoagulation.  If she has recurrent arrhythmias that are definitely atrial fibrillation, she will need anticoagulation.    Richardson Dopp, PA-C     05/10/2020 12:24 PM

## 2020-05-11 ENCOUNTER — Encounter: Payer: Self-pay | Admitting: Cardiology

## 2020-05-11 NOTE — Telephone Encounter (Signed)
Called today by [employer] in regards to followup EKG done this evening ordered by Richardson Dopp, PA to make sure that patient was back out of atrial fibrillation.  Followup EKG showed atrial fibrillation. She tells me that she can tell when she is in atrial fibrillation and she felt the afib earlier today and when she came home she rested and the palpitations resolved.  Currently she is not having any palpitations. Repeat rhythm at 19:51pm 11/14 by [employer] showed NSR at 63bpm.  Will continue current plan outlined by Richardson Dopp, PA and Dr. Oval Linsey on current attached notes.

## 2020-05-11 NOTE — Telephone Encounter (Signed)
I was contacted again today with another alert from her monitor.  I was able to receive the strips via email again.  Her monitor strips clearly demonstrate atrial fibrillation with RVR and aberrant conduction.  The monitor tech informed me that she had ventricular tachycardia but her strips are consistent with aberrancy and not ventricular tachycardia.  I called the patient.  She was with family in [place].  They were having a good time, laughing, etc.  She did feel her heart racing but felt it was short-lived.  She feels perfectly normal now and is driving back home.  She has not had any chest pain or near syncope.  I did ask her to send in a follow-up strip when she gets home.   I will forward this note to Dr. Oval Linsey.   I called the patient to let her know that she will be referred to EP. I also told her that she will need anticoagulation given her overall risk and that Dr. Oval Linsey would be in touch regarding this. Richardson Dopp, PA-C 05/11/2020 4:42 PM

## 2020-05-11 NOTE — Telephone Encounter (Signed)
This encounter was created in error - please disregard.

## 2020-05-11 NOTE — Telephone Encounter (Signed)
Thanks [employer].  Rip Harbour, can we get her to see EP when her monitor is finished.  There isn't much that I'll be able to do for her given her history for bradycardia.

## 2020-05-12 NOTE — Telephone Encounter (Signed)
Thank you!  Claudia [name], can we get her in afib clinic early this week since I'm rounding?

## 2020-05-12 NOTE — Telephone Encounter (Signed)
Received Preventice strips from weekend and faxed to [place] at [facility] clinic. Strips reviewed and patient has appointment 11/17 at 11:00 am Patient aware of date and time. Number given so patient could call and listen to message for location, parking garage code given

## 2020-05-14 ENCOUNTER — Encounter (HOSPITAL_COMMUNITY): Payer: Self-pay | Admitting: Nurse Practitioner

## 2020-05-14 ENCOUNTER — Ambulatory Visit (HOSPITAL_COMMUNITY)
Admission: RE | Admit: 2020-05-14 | Discharge: 2020-05-14 | Disposition: A | Discharge status: Home / Self Care | Payer: PPO | Source: Ambulatory Visit | Attending: Nurse Practitioner | Admitting: Nurse Practitioner

## 2020-05-14 ENCOUNTER — Other Ambulatory Visit (HOSPITAL_COMMUNITY): Payer: Self-pay | Admitting: Nurse Practitioner

## 2020-05-14 ENCOUNTER — Other Ambulatory Visit: Payer: Self-pay

## 2020-05-14 VITALS — BP 136/70 | HR 67 | Ht 70.0 in | Wt 218.4 lb

## 2020-05-14 DIAGNOSIS — Z8249 Family history of ischemic heart disease and other diseases of the circulatory system: Secondary | ICD-10-CM | POA: Insufficient documentation

## 2020-05-14 DIAGNOSIS — Z7901 Long term (current) use of anticoagulants: Secondary | ICD-10-CM | POA: Insufficient documentation

## 2020-05-14 DIAGNOSIS — E039 Hypothyroidism, unspecified: Secondary | ICD-10-CM | POA: Diagnosis not present

## 2020-05-14 DIAGNOSIS — I10 Essential (primary) hypertension: Secondary | ICD-10-CM | POA: Insufficient documentation

## 2020-05-14 DIAGNOSIS — E785 Hyperlipidemia, unspecified: Secondary | ICD-10-CM | POA: Insufficient documentation

## 2020-05-14 DIAGNOSIS — Z888 Allergy status to other drugs, medicaments and biological substances status: Secondary | ICD-10-CM | POA: Insufficient documentation

## 2020-05-14 DIAGNOSIS — I4891 Unspecified atrial fibrillation: Secondary | ICD-10-CM

## 2020-05-14 DIAGNOSIS — Z79899 Other long term (current) drug therapy: Secondary | ICD-10-CM | POA: Insufficient documentation

## 2020-05-14 DIAGNOSIS — I480 Paroxysmal atrial fibrillation: Secondary | ICD-10-CM | POA: Insufficient documentation

## 2020-05-14 MED ORDER — DILTIAZEM HCL 30 MG PO TABS: ORAL_TABLET | ORAL | 1 refills | Status: DC

## 2020-05-14 MED ORDER — APIXABAN 5 MG PO TABS: 5.0000 mg | ORAL_TABLET | Freq: Two times a day (BID) | ORAL | 3 refills | Status: DC

## 2020-05-14 NOTE — Patient Instructions (Signed)
Stop aspirin  Start Eliquis 5mg  twice a day   Cardizem 30mg  -- take 1 tablet every 4 hours AS NEEDED for AFIB heart rate >100 as long as top number of blood pressure >100.

## 2020-05-14 NOTE — Progress Notes (Signed)
Primary Care Physician: Janith Lima, MD Referring Physician:Dr. Augustina Braddock is a 86 y.o. female with a h/o HTN, HLD, hypothyroidism, and history of bradycardia. She was seen by Almyra Deforest, PA, for c/o palpitations. An event  monitor was placed and this past weekend showed afib with RVR. The pt was aware. She was referred to the afib clinic  for further management. She denies alcohol use, no caffeine, denies snoring/apnea.   Today, she denies symptoms of palpitations, chest pain, shortness of breath, orthopnea, PND, lower extremity edema, dizziness, presyncope, syncope, or neurologic sequela. The patient is tolerating medications without difficulties and is otherwise without complaint today.   Past Medical History:  Diagnosis Date  . Abnormality of gait   . Brachial neuritis or radiculitis NOS   . Degeneration of lumbar or lumbosacral intervertebral disc   . Essential hypertension, benign   . Gouty arthropathy   . Lumbago   . Obesity   . Osteoarthritis   . Pure hypercholesterolemia   . Unspecified hypothyroidism    Past Surgical History:  Procedure Laterality Date  . ABDOMINAL HYSTERECTOMY  03/13/2010  . BACK SURGERY    . LUMBAR LAMINECTOMY  03/13/2010  . right knee replacement  03/13/2010    Current Outpatient Medications  Medication Sig Dispense Refill  . cetirizine (ZYRTEC) 10 MG tablet Take 1 tablet (10 mg total) by mouth daily. 90 tablet 1  . FLUAD QUADRIVALENT 0.5 ML injection     . HYDROcodone-acetaminophen (NORCO/VICODIN) 5-325 MG tablet Take 1 tablet by mouth every 6 (six) hours as needed for moderate pain. 90 tablet 0  . indapamide (LOZOL) 1.25 MG tablet Take 1 tablet (1.25 mg total) by mouth daily. 90 tablet 1  . linaclotide (LINZESS) 290 MCG CAPS capsule TAKE 1 CAPSULE BY MOUTH DAILY BEFORE BREAKFAST 90 capsule 1  . rosuvastatin (CRESTOR) 10 MG tablet Take 1 tablet (10 mg total) by mouth daily. 90 tablet 3  . vitamin C (ASCORBIC ACID) 500 MG  tablet Take 500 mg by mouth daily.    . vitamin E (VITAMIN E) 180 MG (400 UNITS) capsule Take 400 Units by mouth daily.    [name] apixaban (ELIQUIS) 5 MG TABS tablet Take 1 tablet (5 mg total) by mouth 2 (two) times daily. 60 tablet 3  . diltiazem (CARDIZEM) 30 MG tablet Take 1 tablet every 4 hours AS NEEDED for afib heart rate >100 as long as blood pressure >100. 45 tablet 1   No current facility-administered medications for this encounter.    Allergies  Allergen Reactions  . Lipitor [Atorvastatin] Other (See Comments)    Muscle aches  . Trileptal [Oxcarbazepine] Other (See Comments)    confusion    Social History   Socioeconomic History  . Marital status: Married    Spouse name: Not on file  . Number of children: Not on file  . Years of education: Not on file  . Highest education level: Not on file  Occupational History  . Not on file  Tobacco Use  . Smoking status: Never Smoker  . Smokeless tobacco: Never Used  Substance and Sexual Activity  . Alcohol use: No    Comment: social  . Drug use: No  . Sexual activity: Never  Other Topics Concern  . Not on file  Social History Narrative  . Not on file   Social Determinants of Health   Financial Resource Strain: Low Risk   . Difficulty of Paying Living Expenses: Not hard at  all  Food Insecurity: No Food Insecurity  . Worried About Charity fundraiser in the Last Year: Never true  . Ran Out of Food in the Last Year: Never true  Transportation Needs: No Transportation Needs  . Lack of Transportation (Medical): No  . Lack of Transportation (Non-Medical): No  Physical Activity: Inactive  . Days of Exercise per Week: 0 days  . Minutes of Exercise per Session: 0 min  Stress: No Stress Concern Present  . Feeling of Stress : Not at all  Social Connections: Moderately Integrated  . Frequency of Communication with Friends and Family: More than three times a week  . Frequency of Social Gatherings with Friends and Family: Never  .  Attends Religious Services: 1 to 4 times per year  . Active Member of Clubs or Organizations: No  . Attends Archivist Meetings: Never  . Marital Status: Married  Human resources officer Violence:   . Fear of Current or Ex-Partner: Not on file  . Emotionally Abused: Not on file  . Physically Abused: Not on file  . Sexually Abused: Not on file    Family History  Problem Relation Age of Onset  . Heart disease Father   . Heart attack Father   . Hypertension Mother   . Alzheimer's disease Mother   . Diabetes Sister   . Cirrhosis Brother   . Diabetes Sister   . Diabetes Sister   . Diabetes Brother   . Heart attack Brother   . Cancer Neg Hx   . Kidney disease Neg Hx     ROS- All systems are reviewed and negative except as per the HPI above  Physical Exam: Vitals:   05/14/20 1050  BP: 136/70  Pulse: 67  Weight: 99.1 kg  Height: 5\' 10"  (1.778 m)   Wt Readings from Last 3 Encounters:  05/14/20 99.1 kg  05/05/20 98.9 kg  05/05/20 98.9 kg    Labs: Lab Results  Component Value Date   NA 140 05/05/2020   K 4.7 05/05/2020   CL 106 05/05/2020   CO2 28 05/05/2020   GLUCOSE 82 05/05/2020   BUN 21 05/05/2020   CREATININE 1.26 (H) 05/05/2020   CALCIUM 9.7 05/05/2020   MG 2.0 01/01/2016   Lab Results  Component Value Date   INR 1.00 04/04/2017   Lab Results  Component Value Date   CHOL 138 05/05/2020   HDL 52.40 05/05/2020   LDLCALC 65 05/05/2020   TRIG 104.0 05/05/2020     GEN- The patient is well appearing, alert and oriented x 3 today.   Head- normocephalic, atraumatic Eyes-  Sclera clear, conjunctiva pink Ears- hearing intact Oropharynx- clear Neck- supple, no JVP Lymph- no cervical lymphadenopathy Lungs- Clear to ausculation bilaterally, normal work of breathing Heart- Regular rate and rhythm, no murmurs, rubs or gallops, PMI not laterally displaced GI- soft, NT, ND, + BS Extremities- no clubbing, cyanosis, or edema MS- no significant deformity or  atrophy Skin- no rash or lesion Psych- euthymic mood, full affect Neuro- strength and sensation are intact  EKG-NSR at 67 bpm, pr int 178 ms, qrs int 471 ms Echo- - Left ventricle: The cavity size was normal. Wall thickness was  increased in a pattern of moderate LVH. There was focal basal  hypertrophy. Systolic function was normal. The estimated ejection  fraction was in the range of 55% to 60%. Wall motion was normal;  there were no regional wall motion abnormalities. Left  ventricular diastolic function parameters were normal  for the  patient&'s age.  - Aortic valve: There was trivial regurgitation.  - Left atrium: The atrium was moderately dilated.  - Pulmonary arteries: Systolic pressure was mildly increased. PA  peak pressure: 31 mm Hg (S).     Assessment and Plan: 1. Paroxsymal afib  New onset by event monitor She is wearing for another 2 weeks General education and triggers discussed  For now I will not prescribe daily rate control  med until full monitor is reviewed and total afib burden is understood. She is SR with a heart rate in the 60's today.  I have prescribed 30 mg Cardizem to use as needed for breakthrough episodes.   2. CHA2DS2VASc score of 4 I discussed risk of stroke and anticoagulation  Available DOAC's  were discussed  She would prefer to start eliquis 5 mg bid  She will stop ASA  Denies a bleeding history  Bleeding precautions discussed   I will see back in 2-3 weeks   Butch Penny C. Mcdonald Reiling, [facility] [address] [place], [place] [address] [phone]

## 2020-05-28 ENCOUNTER — Telehealth: Payer: Self-pay | Admitting: Internal Medicine

## 2020-05-28 NOTE — Telephone Encounter (Signed)
HYDROcodone-acetaminophen (NORCO/VICODIN) 5-325 MG tablet [phone] - [place], [place] AT [place] Phone:  [phone]  Fax:  [phone]     Requesting a refill

## 2020-05-30 ENCOUNTER — Other Ambulatory Visit: Payer: Self-pay | Admitting: Internal Medicine

## 2020-05-30 DIAGNOSIS — M170 Bilateral primary osteoarthritis of knee: Secondary | ICD-10-CM

## 2020-05-30 DIAGNOSIS — M961 Postlaminectomy syndrome, not elsewhere classified: Secondary | ICD-10-CM

## 2020-05-30 DIAGNOSIS — M47817 Spondylosis without myelopathy or radiculopathy, lumbosacral region: Secondary | ICD-10-CM

## 2020-05-30 MED ORDER — HYDROCODONE-ACETAMINOPHEN 5-325 MG PO TABS: 1.0000 | ORAL_TABLET | Freq: Four times a day (QID) | ORAL | 0 refills | Status: DC | PRN

## 2020-06-11 ENCOUNTER — Other Ambulatory Visit: Payer: Self-pay

## 2020-06-11 ENCOUNTER — Ambulatory Visit (HOSPITAL_COMMUNITY)
Admission: RE | Admit: 2020-06-11 | Discharge: 2020-06-11 | Disposition: A | Discharge status: Home / Self Care | Payer: PPO | Source: Ambulatory Visit | Attending: Nurse Practitioner | Admitting: Nurse Practitioner

## 2020-06-11 ENCOUNTER — Encounter (HOSPITAL_COMMUNITY): Payer: Self-pay | Admitting: Nurse Practitioner

## 2020-06-11 VITALS — BP 142/66 | HR 53 | Ht 70.0 in | Wt 218.4 lb

## 2020-06-11 DIAGNOSIS — Z7901 Long term (current) use of anticoagulants: Secondary | ICD-10-CM | POA: Insufficient documentation

## 2020-06-11 DIAGNOSIS — Z90710 Acquired absence of both cervix and uterus: Secondary | ICD-10-CM | POA: Diagnosis not present

## 2020-06-11 DIAGNOSIS — I4891 Unspecified atrial fibrillation: Secondary | ICD-10-CM

## 2020-06-11 DIAGNOSIS — Z96651 Presence of right artificial knee joint: Secondary | ICD-10-CM | POA: Insufficient documentation

## 2020-06-11 DIAGNOSIS — D6869 Other thrombophilia: Secondary | ICD-10-CM

## 2020-06-11 DIAGNOSIS — Z79899 Other long term (current) drug therapy: Secondary | ICD-10-CM | POA: Insufficient documentation

## 2020-06-11 DIAGNOSIS — I10 Essential (primary) hypertension: Secondary | ICD-10-CM | POA: Insufficient documentation

## 2020-06-11 DIAGNOSIS — I480 Paroxysmal atrial fibrillation: Secondary | ICD-10-CM | POA: Insufficient documentation

## 2020-06-11 DIAGNOSIS — Z8249 Family history of ischemic heart disease and other diseases of the circulatory system: Secondary | ICD-10-CM | POA: Insufficient documentation

## 2020-06-11 DIAGNOSIS — E039 Hypothyroidism, unspecified: Secondary | ICD-10-CM | POA: Insufficient documentation

## 2020-06-11 DIAGNOSIS — E785 Hyperlipidemia, unspecified: Secondary | ICD-10-CM | POA: Diagnosis not present

## 2020-06-11 LAB — CBC
HCT: 39.8 % (ref 36.0–46.0)
Hemoglobin: 12.2 g/dL (ref 12.0–15.0)
MCH: 27.9 pg (ref 26.0–34.0)
MCHC: 30.7 g/dL (ref 30.0–36.0)
MCV: 91.1 fL (ref 80.0–100.0)
Platelets: 200 10*3/uL (ref 150–400)
RBC: 4.37 MIL/uL (ref 3.87–5.11)
RDW: 13.9 % (ref 11.5–15.5)
WBC: 4.4 10*3/uL (ref 4.0–10.5)
nRBC: 0 % (ref 0.0–0.2)

## 2020-06-11 LAB — BASIC METABOLIC PANEL
Anion gap: 6 (ref 5–15)
BUN: 20 mg/dL (ref 8–23)
CO2: 29 mmol/L (ref 22–32)
Calcium: 10.1 mg/dL (ref 8.9–10.3)
Chloride: 106 mmol/L (ref 98–111)
Creatinine, Ser: 1.09 mg/dL — ABNORMAL HIGH (ref 0.44–1.00)
GFR, Estimated: 50 mL/min — ABNORMAL LOW (ref >60)
Glucose, Bld: 119 mg/dL — ABNORMAL HIGH (ref 70–99)
Potassium: 4.5 mmol/L (ref 3.5–5.1)
Sodium: 141 mmol/L (ref 135–145)

## 2020-06-11 NOTE — Addendum Note (Signed)
Encounter addended by: Sherran Needs, NP on: 06/11/2020 12:55 PM  Actions taken: Clinical Note Signed

## 2020-06-11 NOTE — Progress Notes (Addendum)
Primary Care Physician: Janith Lima, MD Referring Physician:Dr. Reannah Totten is a 86 y.o. female with a h/o HTN, HLD, hypothyroidism, and history of bradycardia. She was seen by Almyra Deforest, PA, for c/o palpitations. An event  monitor was placed and this past weekend showed afib with RVR. The pt was aware. She was referred to the afib clinic  for further management. She denies alcohol use, no caffeine, denies snoring/apnea.   Pt returns to afib clinic 12/15/ 21. She has finished wearing the monitor x one month and had low afib burden, less than 1%. She has felt palpitations one time since I last saw pt and she took one 30 mg Cardizem and palps did not last long. She is doing well on anticoagulation, no bleeding issues.   Today, she denies symptoms of palpitations, chest pain, shortness of breath, orthopnea, PND, lower extremity edema, dizziness, presyncope, syncope, or neurologic sequela. The patient is tolerating medications without difficulties and is otherwise without complaint today.   Past Medical History:  Diagnosis Date  . Abnormality of gait   . Brachial neuritis or radiculitis NOS   . Degeneration of lumbar or lumbosacral intervertebral disc   . Essential hypertension, benign   . Gouty arthropathy   . Lumbago   . Obesity   . Osteoarthritis   . Pure hypercholesterolemia   . Unspecified hypothyroidism    Past Surgical History:  Procedure Laterality Date  . ABDOMINAL HYSTERECTOMY  03/13/2010  . BACK SURGERY    . LUMBAR LAMINECTOMY  03/13/2010  . right knee replacement  03/13/2010    Current Outpatient Medications  Medication Sig Dispense Refill  . apixaban (ELIQUIS) 5 MG TABS tablet Take 1 tablet (5 mg total) by mouth 2 (two) times daily. 60 tablet 3  . cetirizine (ZYRTEC) 10 MG tablet Take 1 tablet (10 mg total) by mouth daily. 90 tablet 1  . diltiazem (CARDIZEM) 30 MG tablet TAKE 1 TABLET BY MOUTH EVERY 4 HOURS AS NEEDED FOR AFIB HEART RATE GREATER THAN  100 AS LONG AS BLOOD PRESSURE LESS THAN 100 60 tablet 1  . FLUAD QUADRIVALENT 0.5 ML injection     . HYDROcodone-acetaminophen (NORCO/VICODIN) 5-325 MG tablet Take 1 tablet by mouth every 6 (six) hours as needed for moderate pain. 90 tablet 0  . indapamide (LOZOL) 1.25 MG tablet Take 1 tablet (1.25 mg total) by mouth daily. 90 tablet 1  . linaclotide (LINZESS) 290 MCG CAPS capsule TAKE 1 CAPSULE BY MOUTH DAILY BEFORE BREAKFAST 90 capsule 1  . rosuvastatin (CRESTOR) 10 MG tablet Take 1 tablet (10 mg total) by mouth daily. 90 tablet 3  . vitamin C (ASCORBIC ACID) 500 MG tablet Take 500 mg by mouth daily.    . vitamin E 180 MG (400 UNITS) capsule Take 400 Units by mouth daily.     No current facility-administered medications for this encounter.    Allergies  Allergen Reactions  . Lipitor [Atorvastatin] Other (See Comments)    Muscle aches  . Trileptal [Oxcarbazepine] Other (See Comments)    confusion    Social History   Socioeconomic History  . Marital status: Married    Spouse name: Not on file  . Number of children: Not on file  . Years of education: Not on file  . Highest education level: Not on file  Occupational History  . Not on file  Tobacco Use  . Smoking status: Never Smoker  . Smokeless tobacco: Never Used  Substance and Sexual  Activity  . Alcohol use: No    Comment: social  . Drug use: No  . Sexual activity: Never  Other Topics Concern  . Not on file  Social History Narrative  . Not on file   Social Determinants of Health   Financial Resource Strain: Low Risk   . Difficulty of Paying Living Expenses: Not hard at all  Food Insecurity: No Food Insecurity  . Worried About Charity fundraiser in the Last Year: Never true  . Ran Out of Food in the Last Year: Never true  Transportation Needs: No Transportation Needs  . Lack of Transportation (Medical): No  . Lack of Transportation (Non-Medical): No  Physical Activity: Inactive  . Days of Exercise per Week: 0  days  . Minutes of Exercise per Session: 0 min  Stress: No Stress Concern Present  . Feeling of Stress : Not at all  Social Connections: Moderately Integrated  . Frequency of Communication with Friends and Family: More than three times a week  . Frequency of Social Gatherings with Friends and Family: Never  . Attends Religious Services: 1 to 4 times per year  . Active Member of Clubs or Organizations: No  . Attends Archivist Meetings: Never  . Marital Status: Married  Human resources officer Violence: Not on file    Family History  Problem Relation Age of Onset  . Heart disease Father   . Heart attack Father   . Hypertension Mother   . Alzheimer's disease Mother   . Diabetes Sister   . Cirrhosis Brother   . Diabetes Sister   . Diabetes Sister   . Diabetes Brother   . Heart attack Brother   . Cancer Neg Hx   . Kidney disease Neg Hx     ROS- All systems are reviewed and negative except as per the HPI above  Physical Exam: Vitals:   06/11/20 1053  BP: (!) 142/66  Pulse: (!) 53  Weight: 99.1 kg  Height: 5\' 10"  (1.778 m)   Wt Readings from Last 3 Encounters:  06/11/20 99.1 kg  05/14/20 99.1 kg  05/05/20 98.9 kg    Labs: Lab Results  Component Value Date   NA 140 05/05/2020   K 4.7 05/05/2020   CL 106 05/05/2020   CO2 28 05/05/2020   GLUCOSE 82 05/05/2020   BUN 21 05/05/2020   CREATININE 1.26 (H) 05/05/2020   CALCIUM 9.7 05/05/2020   MG 2.0 01/01/2016   Lab Results  Component Value Date   INR 1.00 04/04/2017   Lab Results  Component Value Date   CHOL 138 05/05/2020   HDL 52.40 05/05/2020   LDLCALC 65 05/05/2020   TRIG 104.0 05/05/2020     GEN- The patient is well appearing, alert and oriented x 3 today.   Head- normocephalic, atraumatic Eyes-  Sclera clear, conjunctiva pink Ears- hearing intact Oropharynx- clear Neck- supple, no JVP Lymph- no cervical lymphadenopathy Lungs- Clear to ausculation bilaterally, normal work of breathing Heart-  Regular rate and rhythm, no murmurs, rubs or gallops, PMI not laterally displaced GI- soft, NT, ND, + BS Extremities- no clubbing, cyanosis, or edema MS- no significant deformity or atrophy Skin- no rash or lesion Psych- euthymic mood, full affect Neuro- strength and sensation are intact  EKG-NSR at 53 bpm, pr int 176 ms, qrs int 88 ms, qtc 444 ms Echo- - Left ventricle: The cavity size was normal. Wall thickness was  increased in a pattern of moderate LVH. There was focal basal  hypertrophy. Systolic function was normal. The estimated ejection  fraction was in the range of 55% to 60%. Wall motion was normal;  there were no regional wall motion abnormalities. Left  ventricular diastolic function parameters were normal for the  patient&'s age.  - Aortic valve: There was trivial regurgitation.  - Left atrium: The atrium was moderately dilated.  - Pulmonary arteries: Systolic pressure was mildly increased. PA  peak pressure: 31 mm Hg (S).     Assessment and Plan: 1. Paroxsymal afib  New onset by event monitor, reviewed  Low afib burden per monitor Reported  v tach, short runs, ? Rate induced  aberrancy Sent strips to Dr. Rayann Heman for review  For now I will not prescribe daily rate control for low afib burden and bradycardia at at baseline  and she will use her prn Cardizem for breakthrough episodes   2. CHA2DS2VASc score of 4 Continue eliquis 5 mg bid  Bmet/cbc today   I will see back in 2-3 months, sooner if afib burden increases   Addendum- I heard back form Dr. Rayann Heman re strips that showed non sustained runs of ? Tanna Furry,  he said difficult to say, may be sinus with PAC's and nonsustained VT vrs afib vrs atach events with aberrancy, and one  strip appeared to be atrial flutter.   Geroge Baseman Saba Gomm, [facility] [address] [place], [place] [address] [phone]

## 2020-06-12 ENCOUNTER — Ambulatory Visit: Payer: PPO | Admitting: Physician Assistant

## 2020-06-26 ENCOUNTER — Telehealth: Payer: Self-pay | Admitting: Internal Medicine

## 2020-06-26 DIAGNOSIS — M47817 Spondylosis without myelopathy or radiculopathy, lumbosacral region: Secondary | ICD-10-CM

## 2020-06-26 DIAGNOSIS — M961 Postlaminectomy syndrome, not elsewhere classified: Secondary | ICD-10-CM

## 2020-06-26 DIAGNOSIS — M170 Bilateral primary osteoarthritis of knee: Secondary | ICD-10-CM

## 2020-06-26 NOTE — Telephone Encounter (Signed)
   1.Medication Requested: HYDROcodone-acetaminophen (NORCO/VICODIN) 5-325 MG tablet  2. Pharmacy (Name, Street, [place]):[facility] [phone] - [place], [place] - [address]  3. On Med List: yes  4. Last Visit with PCP: 05/05/20  5. Next visit date with PCP: 11/03/20   Agent: Please be advised that RX refills may take up to 3 business days. We ask that you follow-up with your pharmacy.

## 2020-06-30 MED ORDER — HYDROCODONE-ACETAMINOPHEN 5-325 MG PO TABS: 1.0000 | ORAL_TABLET | Freq: Four times a day (QID) | ORAL | 0 refills | Status: DC | PRN

## 2020-06-30 NOTE — Addendum Note (Signed)
Addended by: Tresa Garter on: 06/30/2020 09:58 AM   Modules accepted: Orders

## 2020-06-30 NOTE — Telephone Encounter (Signed)
Okay.  Office visit every 3 months.  Thanks

## 2020-07-10 ENCOUNTER — Other Ambulatory Visit (HOSPITAL_COMMUNITY): Payer: Self-pay | Admitting: *Deleted

## 2020-07-10 MED ORDER — APIXABAN 5 MG PO TABS
5.0000 mg | ORAL_TABLET | Freq: Two times a day (BID) | ORAL | 2 refills | Status: DC
Start: 2020-07-10 — End: 2020-11-03

## 2020-07-30 ENCOUNTER — Telehealth: Payer: Self-pay | Admitting: Internal Medicine

## 2020-07-30 NOTE — Telephone Encounter (Signed)
1.Medication Requested :HYDROcodone-acetaminophen (NORCO/VICODIN) 5-325 MG tablet  linaclotide (LINZESS) 290 MCG CAPS capsule (new prescription)    2. Pharmacy (Name, Street, [place]): [facility] [phone] - [place], [place] AT [place]  3. On Med List: yes   4. Last Visit with PCP: 11.8.21  5. Next visit date with PCP: 5.9.22   Agent: Please be advised that RX refills may take up to 3 business days. We ask that you follow-up with your pharmacy.

## 2020-07-30 NOTE — Progress Notes (Signed)
  Chronic Care Management   Note  07/30/2020 Name: KYMBERLY BLOMBERG MRN: [id number] DOB: 1934-06-22  CHEILA WICKSTROM is a 87 y.o. year old female who is a primary care patient of Janith Lima, MD. I reached out to [name] by phone today in response to a referral sent by Ms. Judi Saa Seawright's PCP, Janith Lima, MD.   Ms. Caputo was given information about Chronic Care Management services today including:  1. CCM service includes personalized support from designated clinical staff supervised by her physician, including individualized plan of care and coordination with other care providers 2. 24/7 contact phone numbers for assistance for urgent and routine care needs. 3. Service will only be billed when office clinical staff spend 20 minutes or more in a month to coordinate care. 4. Only one practitioner may furnish and bill the service in a calendar month. 5. The patient may stop CCM services at any time (effective at the end of the month) by phone call to the office staff.   Patient agreed to services and verbal consent obtained.   Follow up plan:   Carley Perdue [employer] Scheduler

## 2020-07-30 NOTE — Progress Notes (Signed)
  Chronic Care Management   Outreach Note  07/30/2020 Name: AVERILL WINTERS MRN: [id number] DOB: 1934/01/29  Referred by: Janith Lima, MD Reason for referral : No chief complaint on file.   An unsuccessful telephone outreach was attempted today. The patient was referred to the pharmacist for assistance with care management and care coordination.   Follow Up Plan:   Carley Perdue [employer] Scheduler

## 2020-08-01 ENCOUNTER — Other Ambulatory Visit: Payer: Self-pay | Admitting: Internal Medicine

## 2020-08-01 DIAGNOSIS — M47817 Spondylosis without myelopathy or radiculopathy, lumbosacral region: Secondary | ICD-10-CM

## 2020-08-01 DIAGNOSIS — M961 Postlaminectomy syndrome, not elsewhere classified: Secondary | ICD-10-CM

## 2020-08-01 DIAGNOSIS — M170 Bilateral primary osteoarthritis of knee: Secondary | ICD-10-CM

## 2020-08-01 MED ORDER — HYDROCODONE-ACETAMINOPHEN 5-325 MG PO TABS: 1.0000 | ORAL_TABLET | Freq: Four times a day (QID) | ORAL | 0 refills | Status: DC | PRN

## 2020-08-26 ENCOUNTER — Telehealth: Payer: Self-pay | Admitting: Internal Medicine

## 2020-08-26 NOTE — Telephone Encounter (Signed)
    Patient requesting refill for HYDROcodone-acetaminophen (NORCO/VICODIN) 5-325 MG tablet  Pharmacy [facility] [place], [place]

## 2020-08-27 ENCOUNTER — Other Ambulatory Visit: Payer: Self-pay | Admitting: Internal Medicine

## 2020-08-27 DIAGNOSIS — M961 Postlaminectomy syndrome, not elsewhere classified: Secondary | ICD-10-CM

## 2020-08-27 DIAGNOSIS — M47817 Spondylosis without myelopathy or radiculopathy, lumbosacral region: Secondary | ICD-10-CM

## 2020-08-27 DIAGNOSIS — Z79891 Long term (current) use of opiate analgesic: Secondary | ICD-10-CM

## 2020-08-27 DIAGNOSIS — M170 Bilateral primary osteoarthritis of knee: Secondary | ICD-10-CM

## 2020-08-27 MED ORDER — NALOXONE HCL 4 MG/0.1ML NA LIQD: 1.0000 | Freq: Once | NASAL | 2 refills | Status: AC

## 2020-08-27 MED ORDER — HYDROCODONE-ACETAMINOPHEN 5-325 MG PO TABS: 1.0000 | ORAL_TABLET | Freq: Four times a day (QID) | ORAL | 0 refills | Status: DC | PRN

## 2020-09-09 ENCOUNTER — Other Ambulatory Visit: Payer: Self-pay

## 2020-09-09 ENCOUNTER — Ambulatory Visit (HOSPITAL_COMMUNITY)
Admission: RE | Admit: 2020-09-09 | Discharge: 2020-09-09 | Disposition: A | Discharge status: Home / Self Care | Payer: PPO | Source: Ambulatory Visit | Attending: Nurse Practitioner | Admitting: Nurse Practitioner

## 2020-09-09 ENCOUNTER — Encounter (HOSPITAL_COMMUNITY): Payer: Self-pay | Admitting: Nurse Practitioner

## 2020-09-09 VITALS — BP 130/62 | HR 57 | Ht 70.0 in | Wt 219.2 lb

## 2020-09-09 DIAGNOSIS — D6869 Other thrombophilia: Secondary | ICD-10-CM | POA: Diagnosis not present

## 2020-09-09 DIAGNOSIS — E039 Hypothyroidism, unspecified: Secondary | ICD-10-CM | POA: Insufficient documentation

## 2020-09-09 DIAGNOSIS — E785 Hyperlipidemia, unspecified: Secondary | ICD-10-CM | POA: Insufficient documentation

## 2020-09-09 DIAGNOSIS — Z79899 Other long term (current) drug therapy: Secondary | ICD-10-CM | POA: Insufficient documentation

## 2020-09-09 DIAGNOSIS — Z8249 Family history of ischemic heart disease and other diseases of the circulatory system: Secondary | ICD-10-CM | POA: Diagnosis not present

## 2020-09-09 DIAGNOSIS — Z7901 Long term (current) use of anticoagulants: Secondary | ICD-10-CM | POA: Diagnosis not present

## 2020-09-09 DIAGNOSIS — I10 Essential (primary) hypertension: Secondary | ICD-10-CM | POA: Diagnosis not present

## 2020-09-09 DIAGNOSIS — Z888 Allergy status to other drugs, medicaments and biological substances status: Secondary | ICD-10-CM | POA: Diagnosis not present

## 2020-09-09 DIAGNOSIS — I480 Paroxysmal atrial fibrillation: Secondary | ICD-10-CM | POA: Insufficient documentation

## 2020-09-09 DIAGNOSIS — I4891 Unspecified atrial fibrillation: Secondary | ICD-10-CM

## 2020-09-09 NOTE — Progress Notes (Signed)
Primary Care Physician: Janith Lima, MD Referring Physician:Dr. Shavon Ashmore is a 87 y.o. female with a h/o HTN, HLD, hypothyroidism, and history of bradycardia. She was seen by Almyra Deforest, PA, for c/o palpitations. An event  monitor was placed and this past weekend showed afib with RVR. The pt was aware. She was referred to the afib clinic  for further management. She denies alcohol use, no caffeine, denies snoring/apnea.   Pt returns to afib clinic 12/15/ 21. She has finished wearing the monitor x one month and had low afib burden, less than 1%. She has felt palpitations one time since I last saw pt and she took one 30 mg Cardizem and palps did not last long. She is doing well on anticoagulation, no bleeding issues.   F/u afib clinic, 09/09/20. Pt states that she is doing well. Has not noted any afib or heart irregularity. She is doing well with her eliquis, complaint, no bleeding. No concerns today.   Today, she denies symptoms of palpitations, chest pain, shortness of breath, orthopnea, PND, lower extremity edema, dizziness, presyncope, syncope, or neurologic sequela. The patient is tolerating medications without difficulties and is otherwise without complaint today.   Past Medical History:  Diagnosis Date  . Abnormality of gait   . Brachial neuritis or radiculitis NOS   . Degeneration of lumbar or lumbosacral intervertebral disc   . Essential hypertension, benign   . Gouty arthropathy   . Lumbago   . Obesity   . Osteoarthritis   . Pure hypercholesterolemia   . Unspecified hypothyroidism    Past Surgical History:  Procedure Laterality Date  . ABDOMINAL HYSTERECTOMY  03/13/2010  . BACK SURGERY    . LUMBAR LAMINECTOMY  03/13/2010  . right knee replacement  03/13/2010    Current Outpatient Medications  Medication Sig Dispense Refill  . apixaban (ELIQUIS) 5 MG TABS tablet Take 1 tablet (5 mg total) by mouth 2 (two) times daily. 180 tablet 2  . cetirizine  (ZYRTEC) 10 MG tablet Take 1 tablet (10 mg total) by mouth daily. 90 tablet 1  . diltiazem (CARDIZEM) 30 MG tablet TAKE 1 TABLET BY MOUTH EVERY 4 HOURS AS NEEDED FOR AFIB HEART RATE GREATER THAN 100 AS LONG AS BLOOD PRESSURE LESS THAN 100 60 tablet 1  . FLUAD QUADRIVALENT 0.5 ML injection     . HYDROcodone-acetaminophen (NORCO/VICODIN) 5-325 MG tablet Take 1 tablet by mouth every 6 (six) hours as needed for moderate pain. 90 tablet 0  . indapamide (LOZOL) 1.25 MG tablet Take 1 tablet (1.25 mg total) by mouth daily. 90 tablet 1  . linaclotide (LINZESS) 290 MCG CAPS capsule TAKE 1 CAPSULE BY MOUTH DAILY BEFORE BREAKFAST 90 capsule 1  . rosuvastatin (CRESTOR) 10 MG tablet Take 1 tablet (10 mg total) by mouth daily. 90 tablet 3  . vitamin C (ASCORBIC ACID) 500 MG tablet Take 500 mg by mouth daily.    . vitamin E 180 MG (400 UNITS) capsule Take 400 Units by mouth daily.     No current facility-administered medications for this encounter.    Allergies  Allergen Reactions  . Lipitor [Atorvastatin] Other (See Comments)    Muscle aches  . Trileptal [Oxcarbazepine] Other (See Comments)    confusion    Social History   Socioeconomic History  . Marital status: Married    Spouse name: Not on file  . Number of children: Not on file  . Years of education: Not on file  .  Highest education level: Not on file  Occupational History  . Not on file  Tobacco Use  . Smoking status: Never Smoker  . Smokeless tobacco: Never Used  Substance and Sexual Activity  . Alcohol use: No    Comment: social  . Drug use: No  . Sexual activity: Never  Other Topics Concern  . Not on file  Social History Narrative  . Not on file   Social Determinants of Health   Financial Resource Strain: Low Risk   . Difficulty of Paying Living Expenses: Not hard at all  Food Insecurity: No Food Insecurity  . Worried About Charity fundraiser in the Last Year: Never true  . Ran Out of Food in the Last Year: Never true   Transportation Needs: No Transportation Needs  . Lack of Transportation (Medical): No  . Lack of Transportation (Non-Medical): No  Physical Activity: Inactive  . Days of Exercise per Week: 0 days  . Minutes of Exercise per Session: 0 min  Stress: No Stress Concern Present  . Feeling of Stress : Not at all  Social Connections: Moderately Integrated  . Frequency of Communication with Friends and Family: More than three times a week  . Frequency of Social Gatherings with Friends and Family: Never  . Attends Religious Services: 1 to 4 times per year  . Active Member of Clubs or Organizations: No  . Attends Archivist Meetings: Never  . Marital Status: Married  Human resources officer Violence: Not on file    Family History  Problem Relation Age of Onset  . Heart disease Father   . Heart attack Father   . Hypertension Mother   . Alzheimer's disease Mother   . Diabetes Sister   . Cirrhosis Brother   . Diabetes Sister   . Diabetes Sister   . Diabetes Brother   . Heart attack Brother   . Cancer Neg Hx   . Kidney disease Neg Hx     ROS- All systems are reviewed and negative except as per the HPI above  Physical Exam: There were no vitals filed for this visit. Wt Readings from Last 3 Encounters:  06/11/20 99.1 kg  05/14/20 99.1 kg  05/05/20 98.9 kg    Labs: Lab Results  Component Value Date   NA 141 06/11/2020   K 4.5 06/11/2020   CL 106 06/11/2020   CO2 29 06/11/2020   GLUCOSE 119 (H) 06/11/2020   BUN 20 06/11/2020   CREATININE 1.09 (H) 06/11/2020   CALCIUM 10.1 06/11/2020   MG 2.0 01/01/2016   Lab Results  Component Value Date   INR 1.00 04/04/2017   Lab Results  Component Value Date   CHOL 138 05/05/2020   HDL 52.40 05/05/2020   LDLCALC 65 05/05/2020   TRIG 104.0 05/05/2020     GEN- The patient is well appearing, alert and oriented x 3 today.   Head- normocephalic, atraumatic Eyes-  Sclera clear, conjunctiva pink Ears- hearing intact Oropharynx-  clear Neck- supple, no JVP Lymph- no cervical lymphadenopathy Lungs- Clear to ausculation bilaterally, normal work of breathing Heart- Regular rate and rhythm, no murmurs, rubs or gallops, PMI not laterally displaced GI- soft, NT, ND, + BS Extremities- no clubbing, cyanosis, or edema MS- no significant deformity or atrophy Skin- no rash or lesion Psych- euthymic mood, full affect Neuro- strength and sensation are intact  EKG-NSR at 57  bpm, pr int 177 ms, qrs int 80 ms, qtc 453 ms Echo- - Left ventricle: The cavity  size was normal. Wall thickness was  increased in a pattern of moderate LVH. There was focal basal  hypertrophy. Systolic function was normal. The estimated ejection  fraction was in the range of 55% to 60%. Wall motion was normal;  there were no regional wall motion abnormalities. Left  ventricular diastolic function parameters were normal for the  patient&'s age.  - Aortic valve: There was trivial regurgitation.  - Left atrium: The atrium was moderately dilated.  - Pulmonary arteries: Systolic pressure was mildly increased. PA  peak pressure: 31 mm Hg (S).     Assessment and Plan: 1. Paroxsymal afib  New onset by previous  event monitor December 2021 Low afib burden per monitor Pt has not noted any irregular heart beat  No concerns today   2. CHA2DS2VASc score of 4 Continue eliquis 5 mg bid   I will see as needed going forward F/u with Dr. Oval Linsey per recall    Geroge Baseman. Dao Memmott, [facility] [address] [place], [place] [address] [phone]

## 2020-09-15 ENCOUNTER — Telehealth: Payer: Self-pay | Admitting: Pharmacist

## 2020-09-15 NOTE — Progress Notes (Signed)
Chronic Care Management Pharmacy Assistant   Name: Claudia [name]  MRN: [id number] DOB: 07-19-1933   Reason for Encounter: Initial Questions    Recent office visits:  05/05/20 Dr. Ronnald Ramp, Internal Medicine  Recent consult visits:  [facility] visits:  None in previous 6 months  Medications: Outpatient Encounter Medications as of 09/15/2020  Medication Sig  . apixaban (ELIQUIS) 5 MG TABS tablet Take 1 tablet (5 mg total) by mouth 2 (two) times daily.  . cetirizine (ZYRTEC) 10 MG tablet Take 1 tablet (10 mg total) by mouth daily.  [name] diltiazem (CARDIZEM) 30 MG tablet TAKE 1 TABLET BY MOUTH EVERY 4 HOURS AS NEEDED FOR AFIB HEART RATE GREATER THAN 100 AS LONG AS BLOOD PRESSURE LESS THAN 100  . FLUAD QUADRIVALENT 0.5 ML injection   . HYDROcodone-acetaminophen (NORCO/VICODIN) 5-325 MG tablet Take 1 tablet by mouth every 6 (six) hours as needed for moderate pain.  . indapamide (LOZOL) 1.25 MG tablet Take 1 tablet (1.25 mg total) by mouth daily.  [name] linaclotide (LINZESS) 290 MCG CAPS capsule TAKE 1 CAPSULE BY MOUTH DAILY BEFORE BREAKFAST  . rosuvastatin (CRESTOR) 10 MG tablet Take 1 tablet (10 mg total) by mouth daily.  . vitamin C (ASCORBIC ACID) 500 MG tablet Take 500 mg by mouth daily.  . vitamin E 180 MG (400 UNITS) capsule Take 400 Units by mouth daily.   No facility-administered encounter medications on file as of 09/15/2020.     Star Rating Drugs: NO ACE/ARB   Have you seen any other providers since your last visit? The patient states that she has not seen any other provider since she last saw Dr. Ronnald Ramp in November  Any changes in your medications or health? The patient states that she has not had any changes in her medications or health  Any side effects from any medications? The patient states that she does not have any side effects from medications  Do you have an symptoms or problems not managed by your medications? The patient states that she does not have any new  symptoms or problems not managed by medications  Any concerns about your health right now? The patient states that her only concern is her back pain, she feels like it effects her quality of life. Patient is taking Hydrocodone every 6 hours but does not feels like it is helping  Has your provider asked that you check blood pressure, blood sugar, or follow special diet at home? The patient states that she does check her blood pressure occasionally and he last reading was 123/68. She does not check blood sugar, and for her diet she watches her salt intake and portion control  Do you get any type of exercise on a regular basis? The patient states that she does not exercise because of her back issues, but does move around doing plenty of house work  Can you think of a goal you would like to reach for your health? The patient states that her health goal is to maintain healthy eating habits  Do you have any problems getting your medications? The patient states that she does not have any problems with getting medications or the cost of medications from the pharmacy  Is there anything that you would like to discuss during the appointment? The patient states that with the exception of her back pain she does not have anything specific to discuss  Patient is coming to the office for visit, asked to bring medications and supplements to appointment  [name], [place] [phone]   Time spent:30 minutes, telephone call, reviewing chart notes, and documentation

## 2020-09-16 ENCOUNTER — Telehealth: Payer: Self-pay | Admitting: Pharmacist

## 2020-09-16 ENCOUNTER — Other Ambulatory Visit: Payer: Self-pay

## 2020-09-16 ENCOUNTER — Ambulatory Visit (INDEPENDENT_AMBULATORY_CARE_PROVIDER_SITE_OTHER): Payer: PPO | Admitting: Pharmacist

## 2020-09-16 ENCOUNTER — Other Ambulatory Visit: Payer: Self-pay | Admitting: Internal Medicine

## 2020-09-16 DIAGNOSIS — E7800 Pure hypercholesterolemia, unspecified: Secondary | ICD-10-CM

## 2020-09-16 DIAGNOSIS — G8929 Other chronic pain: Secondary | ICD-10-CM

## 2020-09-16 DIAGNOSIS — I10 Essential (primary) hypertension: Secondary | ICD-10-CM

## 2020-09-16 DIAGNOSIS — M47817 Spondylosis without myelopathy or radiculopathy, lumbosacral region: Secondary | ICD-10-CM

## 2020-09-16 DIAGNOSIS — M545 Low back pain, unspecified: Secondary | ICD-10-CM

## 2020-09-16 DIAGNOSIS — N1832 Chronic kidney disease, stage 3b: Secondary | ICD-10-CM

## 2020-09-16 NOTE — Progress Notes (Addendum)
° ° °  Chronic Care Management Pharmacy Assistant   Name: Claudia [name]  MRN: [id number] DOB: [birth year]   Reason for Encounter: [name]     Medications: Outpatient Encounter Medications as of 09/16/2020  Medication Sig   apixaban (ELIQUIS) 5 MG TABS tablet Take 1 tablet (5 mg total) by mouth 2 (two) times daily.   cetirizine (ZYRTEC) 10 MG tablet Take 1 tablet (10 mg total) by mouth daily.   diclofenac Sodium (VOLTAREN) 1 % GEL Apply topically 4 (four) times daily.   diltiazem (CARDIZEM) 30 MG tablet TAKE 1 TABLET BY MOUTH EVERY 4 HOURS AS NEEDED FOR AFIB HEART RATE GREATER THAN 100 AS LONG AS BLOOD PRESSURE LESS THAN 100   HYDROcodone-acetaminophen (NORCO/VICODIN) 5-325 MG tablet Take 1 tablet by mouth every 6 (six) hours as needed for moderate pain.   indapamide (LOZOL) 1.25 MG tablet Take 1 tablet (1.25 mg total) by mouth daily.   linaclotide (LINZESS) 290 MCG CAPS capsule TAKE 1 CAPSULE BY MOUTH DAILY BEFORE BREAKFAST   rosuvastatin (CRESTOR) 10 MG tablet Take 1 tablet (10 mg total) by mouth daily.   vitamin C (ASCORBIC ACID) 500 MG tablet Take 500 mg by mouth daily.   vitamin E 180 MG (400 UNITS) capsule Take 400 Units by mouth daily.   No facility-administered encounter medications on file as of 09/16/2020.    Pharmacist Review  The patient had an initial office visit with Clinical pharmacist Charlene Brooke on 09/15/20. Upon completion of the visit the patient has agreed to try [facility] services for their dispensing and delivery of medications. Per clinical pharmacist request I completed an on boarding form with the list of the patients medications, current pharmacy, demographics, allergies and insurance information, The form was then forward to the clinical pharmacist for review.   [name], [place] [phone]

## 2020-09-16 NOTE — Progress Notes (Signed)
Chronic Care Management Pharmacy Note  09/16/2020 Name:  Claudia [name] MRN:  [id number] DOB:  [birth year]  Subjective: Claudia [name] is an 87 y.o. year old female who is a primary patient of Janith Lima, MD.  The CCM team was consulted for assistance with disease management and care coordination needs.    Engaged with patient face to face for initial visit in response to provider referral for pharmacy case management and/or care coordination services.   Consent to Services:  The patient was given the following information about Chronic Care Management services today, agreed to services, and gave verbal consent: 1. CCM service includes personalized support from designated clinical staff supervised by the primary care provider, including individualized plan of care and coordination with other care providers 2. 24/7 contact phone numbers for assistance for urgent and routine care needs. 3. Service will only be billed when office clinical staff spend 20 minutes or more in a month to coordinate care. 4. Only one practitioner may furnish and bill the service in a calendar month. 5.The patient may stop CCM services at any time (effective at the end of the month) by phone call to the office staff. 6. The patient will be responsible for cost sharing (co-pay) of up to 20% of the service fee (after annual deductible is met). Patient agreed to services and consent obtained.  Patient Care Team: Janith Lima, MD as PCP - General (Internal Medicine) Skeet Latch, MD as PCP - Cardiology (Cardiology) Phylliss Bob, MD as Consulting Physician (Orthopedic Surgery) Alanda Slim Neena Rhymes, MD as Consulting Physician (Ophthalmology) Charlton Haws, [facility] as Pharmacist (Pharmacist)  Recent office visits: 05/05/20 Dr Ronnald Ramp OV: CPX. C/o low back pain. C/o dizziness, SBP 112 at home. Switched telmisartan/hctz to indapamide 1.25 mg  Recent consult visits: 05/14/20 NP Roderic Palau (Afib  clinic): Afib w/ RVR seen on event monitor, dx paroxysmal Afib. Stop aspirin, Start Eliquis 5 mg BID and diltiazem PRN for HR > 100 (hx bradycardia)  04/17/20 Dr Eulas Post (cardiology): c/o palpitations. Ordered event monitor. Started rosuvastatin 10 for LDL 155 (Oct 2020). Repeat lipid panel 11/21 showed LDL 65.  Hospital visits: None in previous 6 months  Objective:  Lab Results  Component Value Date   CREATININE 1.09 (H) 06/11/2020   BUN 20 06/11/2020   GFR 39.29 (L) 05/05/2020   GFRNONAA 50 (L) 06/11/2020   GFRAA >60 04/04/2017   NA 141 06/11/2020   K 4.5 06/11/2020   CALCIUM 10.1 06/11/2020   CO2 29 06/11/2020   GLUCOSE 119 (H) 06/11/2020    Lab Results  Component Value Date/Time   HGBA1C 5.8 (H) 08/24/2015 02:05 AM   HGBA1C 5.6 08/10/2011 12:00 AM   GFR 39.29 (L) 05/05/2020 02:12 PM   GFR 63.97 10/25/2019 09:44 AM    Last diabetic Eye exam: No results found for: HMDIABEYEEXA  Last diabetic Foot exam: No results found for: HMDIABFOOTEX   Lab Results  Component Value Date   CHOL 138 05/05/2020   HDL 52.40 05/05/2020   LDLCALC 65 05/05/2020   LDLDIRECT 158.9 09/05/2012   TRIG 104.0 05/05/2020   CHOLHDL 3 05/05/2020    Hepatic Function Latest Ref Rng & Units 05/05/2020 02/20/2018 04/04/2017  Total Protein 6.0 - 8.3 g/dL 6.7 7.0 6.8  Albumin 3.5 - 5.2 g/dL 4.1 4.0 3.9  AST 0 - 37 U/L $Remo'15 12 18  'mEkpp$ ALT 0 - 35 U/L 9 8 11(L)  Alk Phosphatase 39 - 117 U/L 65 78 85  Total Bilirubin  0.2 - 1.2 mg/dL 0.7 0.9 0.9  Bilirubin, Direct 0.0 - 0.3 mg/dL 0.2 - -    Lab Results  Component Value Date/Time   TSH 3.96 05/05/2020 02:12 PM   TSH 3.99 10/25/2019 09:44 AM   FREET4 1.2 03/29/2016 04:41 PM   FREET4 0.90 01/01/2016 12:16 PM    CBC Latest Ref Rng & Units 06/11/2020 05/05/2020 12/04/2018  WBC 4.0 - 10.5 K/uL 4.4 5.0 4.8  Hemoglobin 12.0 - 15.0 g/dL 12.2 12.2 12.9  Hematocrit 36.0 - 46.0 % 39.8 37.4 39.7  Platelets 150 - 400 K/uL 200 216.0 207.0    Lab Results  Component  Value Date/Time   VD25OH 23.5 08/10/2011 12:00 AM    Clinical ASCVD: No  The ASCVD Risk score ([place]., et al., 2013) failed to calculate for the following reasons:   The 2013 ASCVD risk score is only valid for ages 1 to 8    Depression screen PHQ 2/9 05/05/2020 06/13/2018 04/19/2017  Decreased Interest 0 0 0  Down, Depressed, Hopeless 0 0 0  PHQ - 2 Score 0 0 0     CHA2DS2-VASc Score = 4  The patient's score is based upon: CHF History: No HTN History: Yes Diabetes History: No Stroke History: No Vascular Disease History: No Age Score: 2 Gender Score: 1      Social History   Tobacco Use  Smoking Status Never Smoker  Smokeless Tobacco Never Used   BP Readings from Last 3 Encounters:  09/09/20 130/62  06/11/20 (!) 142/66  05/14/20 136/70   Pulse Readings from Last 3 Encounters:  09/09/20 (!) 57  06/11/20 (!) 53  05/14/20 67   Wt Readings from Last 3 Encounters:  09/09/20 219 lb 3.2 oz (99.4 kg)  06/11/20 218 lb 6.4 oz (99.1 kg)  05/14/20 218 lb 6.4 oz (99.1 kg)   BMI Readings from Last 3 Encounters:  09/09/20 31.45 kg/m  06/11/20 31.34 kg/m  05/14/20 31.34 kg/m    Assessment/Interventions: Review of patient past medical history, allergies, medications, health status, including review of consultants reports, laboratory and other test data, was performed as part of comprehensive evaluation and provision of chronic care management services.   SDOH:  (Social Determinants of Health) assessments and interventions performed: Yes  SDOH Screenings   Alcohol Screen: Low Risk   . Last Alcohol Screening Score (AUDIT): 0  Depression (PHQ2-9): Low Risk   . PHQ-2 Score: 0  Financial Resource Strain: Low Risk   . Difficulty of Paying Living Expenses: Not hard at all  Food Insecurity: No Food Insecurity  . Worried About Charity fundraiser in the Last Year: Never true  . Ran Out of Food in the Last Year: Never true  Housing: Low Risk   . Last Housing Risk  Score: 0  Physical Activity: Inactive  . Days of Exercise per Week: 0 days  . Minutes of Exercise per Session: 0 min  Social Connections: Moderately Integrated  . Frequency of Communication with Friends and Family: More than three times a week  . Frequency of Social Gatherings with Friends and Family: Never  . Attends Religious Services: 1 to 4 times per year  . Active Member of Clubs or Organizations: No  . Attends Archivist Meetings: Never  . Marital Status: Married  Stress: No Stress Concern Present  . Feeling of Stress : Not at all  Tobacco Use: Low Risk   . Smoking Tobacco Use: Never Smoker  . Smokeless Tobacco Use: Never Used  Transportation Needs: No Transportation Needs  . Lack of Transportation (Medical): No  . Lack of Transportation (Non-Medical): No    CCM Care Plan  Allergies  Allergen Reactions  . Lipitor [Atorvastatin] Other (See Comments)    Muscle aches  . Trileptal [Oxcarbazepine] Other (See Comments)    confusion    Medications Reviewed Today    Reviewed by Charlton Haws, [facility] (Pharmacist) on 09/16/20 at 1155  Med List Status: <None>  Medication Order Taking? Sig Documenting Provider Last Dose Status Informant  apixaban (ELIQUIS) 5 MG TABS tablet [id number] Yes Take 1 tablet (5 mg total) by mouth 2 (two) times daily. Sherran Needs, NP Taking Active   cetirizine (ZYRTEC) 10 MG tablet [id number] Yes Take 1 tablet (10 mg total) by mouth daily. Janith Lima, MD Taking Active Self  diclofenac Sodium (VOLTAREN) 1 % GEL [id number] Yes Apply topically 4 (four) times daily. [provider] Taking Active   diltiazem (CARDIZEM) 30 MG tablet [id number] Yes TAKE 1 TABLET BY MOUTH EVERY 4 HOURS AS NEEDED FOR AFIB HEART RATE GREATER THAN 100 AS LONG AS BLOOD PRESSURE LESS THAN 100 Sherran Needs, NP Taking Active   HYDROcodone-acetaminophen (NORCO/VICODIN) 5-325 MG tablet [id number] Yes Take 1 tablet by mouth every 6 (six) hours as needed for  moderate pain. Janith Lima, MD Taking Active   indapamide (LOZOL) 1.25 MG tablet [id number] Yes Take 1 tablet (1.25 mg total) by mouth daily. Janith Lima, MD Taking Active   linaclotide [facility]) 290 MCG CAPS capsule [id number] Yes TAKE 1 CAPSULE BY MOUTH DAILY BEFORE BREAKFAST Janith Lima, MD Taking Active   rosuvastatin (CRESTOR) 10 MG tablet [id number] Yes Take 1 tablet (10 mg total) by mouth daily. Almyra Deforest, [place] Taking Active   vitamin C (ASCORBIC ACID) 500 MG tablet [id number] Yes Take 500 mg by mouth daily. [provider] Taking Active Self  vitamin E 180 MG (400 UNITS) capsule [id number] Yes Take 400 Units by mouth daily. [provider] Taking Active           Patient Active Problem List   Diagnosis Date Noted  . Stage 3b chronic kidney disease ([place]) 05/06/2020  . Chronic left-sided low back pain without sciatica 12/25/2018  . Spinal stenosis in cervical region 05/12/2016  . Bradycardia 01/01/2016  . Therapeutic opioid-induced constipation (OIC) 01/01/2016  . Chronic idiopathic constipation 09/11/2013  . Lumbosacral spondylosis without myelopathy 04/09/2013  . Postlaminectomy syndrome, lumbar region 04/09/2013  . Routine general medical examination at a health care facility 02/01/2012  . Neuropathy, peripheral 08/04/2011  . Pure hypercholesterolemia 08/04/2011  . Essential hypertension, benign 08/04/2011  . Hypothyroidism 08/04/2011  . DJD (degenerative joint disease) of knee 08/04/2011  . Gout 08/04/2011    Immunization History  Administered Date(s) Administered  . DTaP 01/01/2008  . Fluad Quad(high Dose 65+) 03/30/2019  . Influenza Split 04/06/2012  . Influenza Whole 05/04/2011  . Influenza, High Dose Seasonal PF 04/17/2013, 03/04/2016, 04/19/2017, 04/07/2018  . Influenza,inj,Quad PF,6+ Mos 04/11/2014, 04/14/2015  . Influenza-Unspecified 04/15/2020  . PFIZER(Purple Top)SARS-COV-2 Vaccination 07/30/2019, 08/25/2019, 03/15/2020  . Pneumococcal  Conjugate-13 04/02/2008, 10/08/2013  . Pneumococcal Polysaccharide-23 09/02/2015  . Tdap 01/01/2008, 08/23/2017  . Zoster 08/03/2006  . Zoster Recombinat (Shingrix) 01/03/2017, 04/02/2017    Conditions to be addressed/monitored:  Hypertension, Hyperlipidemia, Atrial Fibrillation, Chronic Kidney Disease and Osteoarthritis  Care Plan : [place]  Updates made by Charlton Haws, [place] since 09/16/2020 12:00 AM    Problem: Hypertension, Hyperlipidemia, Atrial  Fibrillation, Chronic Kidney Disease and Osteoarthritis   Priority: High    Long-Range Goal: Disease management   Start Date: 09/16/2020  Expected End Date: 03/19/2021  This Visit's Progress: On track  Priority: High  Note:   Current Barriers:  . Unable to independently monitor therapeutic efficacy . Unable to achieve control of back pain   Pharmacist Clinical Goal(s):  [name] Patient will achieve adherence to monitoring guidelines and medication adherence to achieve therapeutic efficacy . achieve control of back pain as evidenced by patient report through collaboration with PharmD and provider.   Interventions: . 1:1 collaboration with Janith Lima, MD regarding development and update of comprehensive plan of care as evidenced by provider attestation and co-signature . Inter-disciplinary care team collaboration (see longitudinal plan of care) . Comprehensive medication review performed; medication list updated in electronic medical record  Hypertensio / CKD stage 3a (BP goal <130/80) -Controlled - home BP is at goal -Current treatment: . Indapamide 1.25 mg daily -Medications previously tried: telmisartan/HCTZ  -Current home readings: 126/68 -Current exercise habits: limited due to back pain -Denies hypotensive/hypertensive symptoms -Educated on BP goals and benefits of medications for prevention of heart attack, stroke and kidney damage; Symptoms of hypotension and importance of maintaining adequate  hydration; -Counseled to monitor BP at home weekly, document, and provide log at future appointments -Recommended to continue current medication and stay hydrated  Atrial Fibrillation (Goal: prevent stroke and major bleeding) -Controlled - pt reports 1 episode of palpitations that resolved with 1 dose of diltiazem -New onset 04/2020. Low Afib burden per monitor. -CHADSVASC: 4 -Current treatment: . Rate control: Diltiazem 30 mg q4h PRN HR > 100 . Anticoagulation: Eliquis 5 mg BID -Counseled on increased risk of stroke due to Afib and benefits of anticoagulation for stroke prevention; importance of adherence to anticoagulant exactly as prescribed; avoidance of NSAIDs due to increased bleeding risk with anticoagulants; -Recommended to continue current medication Assessed patient finances. Currently Eliquis is reasonably priced but during donut hole pt may need assistance  Hyperlipidemia: (LDL goal < 100) -Controlled - LDL improved from 155 to 65 after starting statin -Current treatment: . Rosuvastatin 10 mg daily -Educated on Cholesterol goals;  Benefits of statin for ASCVD risk reduction; -Recommended to continue current medication  Chronic Pain (Goal: improve function) - DJD of knee, lumbosacral spondylosis. Back surgery 2012, has been on opioids ever since -Uncontrolled - pt reports pain is 5/10 today, can get up to 9/10. She reports nothing really makes it better or worse. She thinks hydrocodone is not helping as much as it used to, but she does not want to increase medication - she actually wants to get off of medication. She reports PT has helped in the past. -Current treatment  . Hydrocodone-APAP 5-325 mg q6h PRN (#90/month) -Recommended referral to sports medicine and physical therapy  Constipation (Goal: manage symptoms) -Controlled - pt uses medication infrequently -Current treatment  . Linzess 290 mcg daily PRN -Recommended to continue current medication  Health  Maintenance -Vaccine gaps: none -Current therapy:  [name] Vitamin C 500 mg . Vitamin E 400 IU . Cetirizine 10 mg daily -Patient is satisfied with current therapy and denies issues -Recommended to continue current medication  Patient Goals/Self-Care Activities . Patient will:  - take medications as prescribed  Follow Up Plan: Telephone follow up appointment with care management team member scheduled for: 6 months      Medication Assistance: None required.  Patient affirms current coverage meets needs.  Patient's preferred pharmacy is:  [facility] [phone] - [place], [place] - [address] [name] [address] Phone: [phone] Fax: [phone]  [facility] - [place], [name] - [address] [name] [address] Phone: [phone] Fax: [phone]  Uses pill box? No - keeps bottles Pt endorses 100% compliance  We discussed: Verbal consent obtained for [facility] enhanced pharmacy services (medication synchronization, adherence packaging, delivery coordination). A medication sync plan was created to allow patient to get all medications delivered once every 30 to 90 days per patient preference. Patient understands they have freedom to choose pharmacy and clinical pharmacist will coordinate care between all prescribers and [facility].  Patient decided to: Utilize [facility] for medication synchronization, packaging and delivery  Care Plan and Follow Up Patient Decision:  Patient agrees to Care Plan and Follow-up.  Plan: Telephone follow up appointment with care management team member scheduled for:  6 months  Charlene Brooke, PharmD, [facility] Clinical Pharmacist [facility] at [facility] [phone]

## 2020-09-16 NOTE — Patient Instructions (Addendum)
Visit Information  Phone number for Pharmacist: [phone]  Thank you for meeting with me to discuss your medications! I look forward to working with you to achieve your health care goals. Below is a summary of what we talked about during the visit:  Goals Addressed            This Visit's Progress   . Keep Low Back Pain Under Control       Timeframe:  Long-Range Goal Priority:  High Start Date:     09/16/20                        Expected End Date:    03/27/21                   Follow Up Date 03/27/21   - call for medicine refill 2 or 3 days before it runs out - keep track of prescription refills - plan exercise or activity when pain is best controlled - stay active - track times pain is worst and when it is best - use ice or heat for pain relief - work slower and less intense when having pain    Why is this important?    Day-to-day life can be hard when you have back pain.   Pain medicine is just one piece of the treatment puzzle. There are many things you can do to manage pain and keep your back strong.    Lifestyle changes, like stopping smoking and eating foods with Vitamin D and calcium, keep your bones and muscles healthy. Your back is better when it is supported by strong muscles.   You can try these action steps to help you manage your pain.     Notes:       Patient Care Plan: CCM Pharmacy Care Plan    Problem Identified: Hypertension, Hyperlipidemia, Atrial Fibrillation, Chronic Kidney Disease and Osteoarthritis   Priority: High    Long-Range Goal: Disease management   Start Date: 09/16/2020  Expected End Date: 03/19/2021  This Visit's Progress: On track  Priority: High  Note:   Current Barriers:  . Unable to independently monitor therapeutic efficacy . Unable to achieve control of back pain   Pharmacist Clinical Goal(s):  [name] Patient will achieve adherence to monitoring guidelines and medication adherence to achieve therapeutic efficacy . achieve  control of back pain as evidenced by patient report through collaboration with PharmD and provider.   Interventions: . 1:1 collaboration with Janith Lima, MD regarding development and update of comprehensive plan of care as evidenced by provider attestation and co-signature . Inter-disciplinary care team collaboration (see longitudinal plan of care) . Comprehensive medication review performed; medication list updated in electronic medical record  Hypertensio / CKD stage 3a (BP goal <130/80) -Controlled - home BP is at goal -Current treatment: . Indapamide 1.25 mg daily -Medications previously tried: telmisartan/HCTZ  -Current home readings: 126/68 -Current exercise habits: limited due to back pain -Denies hypotensive/hypertensive symptoms -Educated on BP goals and benefits of medications for prevention of heart attack, stroke and kidney damage; Symptoms of hypotension and importance of maintaining adequate hydration; -Counseled to monitor BP at home weekly, document, and provide log at future appointments -Recommended to continue current medication and stay hydrated  Atrial Fibrillation (Goal: prevent stroke and major bleeding) -Controlled - pt reports 1 episode of palpitations that resolved with 1 dose of diltiazem -New onset 04/2020. Low Afib burden per monitor. -CHADSVASC: 4 -Current treatment: . Rate control:  Diltiazem 30 mg q4h PRN HR > 100 . Anticoagulation: Eliquis 5 mg BID -Counseled on increased risk of stroke due to Afib and benefits of anticoagulation for stroke prevention; importance of adherence to anticoagulant exactly as prescribed; avoidance of NSAIDs due to increased bleeding risk with anticoagulants; -Recommended to continue current medication Assessed patient finances. Currently Eliquis is reasonably priced but during donut hole pt may need assistance  Hyperlipidemia: (LDL goal < 100) -Controlled - LDL improved from 155 to 65 after starting statin -Current  treatment: . Rosuvastatin 10 mg daily -Educated on Cholesterol goals;  Benefits of statin for ASCVD risk reduction; -Recommended to continue current medication  Chronic Pain (Goal: improve function) - DJD of knee, lumbosacral spondylosis. Back surgery 2012, has been on opioids ever since -Uncontrolled - pt reports pain is 5/10 today, can get up to 9/10. She reports nothing really makes it better or worse. She thinks hydrocodone is not helping as much as it used to, but she does not want to increase medication - she actually wants to get off of medication. She reports PT has helped in the past. -Current treatment  . Hydrocodone-APAP 5-325 mg q6h PRN (#90/month) -Recommended referral to sports medicine and physical therapy  Constipation (Goal: manage symptoms) -Controlled - pt uses medication infrequently -Current treatment  . Linzess 290 mcg daily PRN -Recommended to continue current medication  Health Maintenance -Vaccine gaps: none -Current therapy:  [name] Vitamin C 500 mg . Vitamin E 400 IU . Cetirizine 10 mg daily -Patient is satisfied with current therapy and denies issues -Recommended to continue current medication  Patient Goals/Self-Care Activities . Patient will:  - take medications as prescribed  Follow Up Plan: Telephone follow up appointment with care management team member scheduled for: 6 months      Claudia [name] was given information about Chronic Care Management services today including:  1. CCM service includes personalized support from designated clinical staff supervised by her physician, including individualized plan of care and coordination with other care providers 2. 24/7 contact phone numbers for assistance for urgent and routine care needs. 3. Standard insurance, coinsurance, copays and deductibles apply for chronic care management only during months in which we provide at least 20 minutes of these services. Most insurances cover these services at 100%,  however patients may be responsible for any copay, coinsurance and/or deductible if applicable. This service may help you avoid the need for more expensive face-to-face services. 4. Only one practitioner may furnish and bill the service in a calendar month. 5. The patient may stop CCM services at any time (effective at the end of the month) by phone call to the office staff.  Patient agreed to services and verbal consent obtained.   Patient verbalizes understanding of instructions provided today and agrees to view in [place].  Telephone follow up appointment with pharmacy team member scheduled for: 6 months  Charlene Brooke, PharmD, BCACP Clinical Pharmacist [facility] [phone]  [email or web address]">  Managing Chronic Back Pain Chronic back pain is back pain that lasts for 12 weeks or longer. It often affects the lower back. Back pain may feel like a muscle ache or a sharp, stabbing pain. It can be mild, moderate, or severe. If you have been diagnosed with chronic back pain, there are things you can do to manage your symptoms. You may have to try different things to see what works best for you. Your health care provider may also give you specific instructions. How to manage lifestyle  changes Treating chronic back pain often starts with rest and pain relief, followed by exercises to restore movement and strength to your back (physical therapy). You may need surgery if other treatments do not help, or if your pain is caused by a condition or an injury. Follow your treatment plan as told by your health care provider. This may include:  Relaxation techniques.  Talk therapy or counseling with a mental health specialist. A form of talk therapy called cognitive behavioral therapy (CBT) can be especially helpful. This therapy helps you set goals and follow up on the changes that you make.  Acupuncture or massage therapy.  Local electrical  stimulation.  Injections. These deliver numbing or pain-relieving medicines into your spine or the area of pain. How to recognize changes in your chronic back pain Your condition may improve with treatment. However, back pain may not go away or may get worse over time. Watch your symptoms carefully and let your health care provider know if your symptoms get worse or do not improve. Your back pain may be getting worse if you have:  Pain that begins to cause problems with posture.  Pain that gets worse when you are sitting, standing, walking, bending, or lifting.  Pain that affects you while you are active, or at rest, or both.  Pain that eventually makes it hard to move around (limits mobility).  Pain that occurs with fever, weight loss, or difficulty urinating.  Pain that causes numbness and tingling. How to use body mechanics and posture to help with pain Healthy body mechanics and good posture can help to relieve stress on your back. Body mechanics refers to the movements and positions of your body during your daily activities. Posture is part of body mechanics. Good posture means:  Your spine is in its natural S-curve, or neutral, position.  Your shoulders are pulled back slightly.  Your head is not tipped forward. Follow these guidelines to improve your posture and body mechanics in your everyday activities. Standing  When standing, keep your spine neutral and your feet about hip-width apart. Keep your knees slightly bent. Your ears, shoulders, and hips should line up.  When you do a task in which you stand in one place for a long time, place one foot on a stable object that is 2-4 inches (5-10 cm) high, such as a footstool. This helps keep your spine neutral.   Sitting  When sitting, keep your spine neutral and your feet flat on the floor. Use a footrest, if necessary, and keep your thighs parallel to the floor. Avoid rounding your shoulders, and avoid tilting your head  forward.  When working at a desk or a computer, keep your desk at a height where your hands are slightly lower than your elbows. Slide your chair under your desk so you are close enough to maintain good posture.  When working at a computer, place your monitor at a height where you are looking straight ahead and you do not have to tilt your head forward or downward to view the screen.   Lifting  Keep your feet at least shoulder-width apart and tighten the muscles of your abdomen.  Bend your knees and hips and keep your spine neutral. Be sure to lift using the strength of your legs, not your back. Do not lock your knees straight out.  Always ask for help to lift heavy or awkward objects.   Resting  When lying down and resting, avoid positions that are most painful.  If you have pain with activities such as sitting, bending, stooping, or squatting, lie in a position in which your body does not bend very much. For example, avoid curling up on your side with your arms and knees near your chest (fetal position).  If you have pain with activities such as standing for a long time or reaching with your arms, lie with your spine in a neutral position and bend your knees slightly. Try: ? Lying on your side with a pillow between your knees. ? Lying on your back with a pillow under your knees.   Follow these instructions at home: Medicines  Treatment may include over-the-counter or prescription medicines for pain and inflammation that are taken by mouth or applied to the skin. Another treatment may include muscle relaxants. Take over-the-counter and prescription medicines only as told by your health care provider.  Ask your health care provider if the medicine prescribed to you: ? Requires you to avoid driving or using machinery. ? Can cause constipation. You may need to take these actions to prevent or treat constipation:  Drink enough fluid to keep your urine pale yellow.  Take over-the-counter  or prescription medicines.  Eat foods that are high in fiber, such as beans, whole grains, and fresh fruits and vegetables.  Limit foods that are high in fat and processed sugars, such as fried or sweet foods. Lifestyle  Do not use any products that contain nicotine or tobacco, such as cigarettes, e-cigarettes, and chewing tobacco. If you need help quitting, ask your health care provider.  Eat a healthy diet that includes foods such as vegetables, fruits, fish, and lean meats.  Work with your health care provider to achieve or maintain a healthy weight. General instructions  Get regular exercise as told. Exercise improves flexibility and strength.  If physical therapy was prescribed, do exercises as told by your health care provider.  Use ice or heat therapy as told by your health care provider.  Keep all follow-up visits as told by your health care provider. This is important. Where can I get support? Consider joining a support group for people managing chronic back pain. Ask your health care provider about support groups in your area. You can also find online and in-person support groups through:  The American Chronic Pain Association: theacpa.org  Pain Connection Program: painconnection.org Contact a health care provider if:  You have pain that is not relieved with rest or medicine.  Your pain gets worse, or you have new pain.  You have a fever.  You have rapid weight loss.  You have trouble doing your normal activities. Get help right away if:  You have weakness or numbness in one or both of your legs or feet.  You have trouble controlling your bladder or your bowels.  You have severe back pain and have any of the following: ? Nausea or vomiting. ? Abdominal pain. ? Shortness of breath or you faint. Summary  Chronic back pain is often treated with rest, pain relief, and physical therapy.  Talk therapy, acupuncture, massage, and local electrical stimulation may  help.  Follow your treatment plan as told by your health care provider.  Joining a support group may help you manage chronic back pain. This information is not intended to replace advice given to you by your health care provider. Make sure you discuss any questions you have with your health care provider. Document Revised: 07/26/2019 Document Reviewed: 04/03/2019 Elsevier Patient Education  [place].

## 2020-09-24 ENCOUNTER — Other Ambulatory Visit: Payer: Self-pay | Admitting: Internal Medicine

## 2020-09-24 DIAGNOSIS — K5904 Chronic idiopathic constipation: Secondary | ICD-10-CM

## 2020-09-24 DIAGNOSIS — I10 Essential (primary) hypertension: Secondary | ICD-10-CM

## 2020-09-24 MED ORDER — INDAPAMIDE 1.25 MG PO TABS: 1.2500 mg | ORAL_TABLET | Freq: Every day | ORAL | 1 refills | Status: DC

## 2020-09-24 MED ORDER — LINACLOTIDE 290 MCG PO CAPS: ORAL_CAPSULE | ORAL | 1 refills | Status: DC

## 2020-09-24 NOTE — Progress Notes (Signed)
I, Claudia [name], LAT, ATC, am serving as scribe for Dr. Lynne Leader.  Subjective:    I'm seeing this patient as a consultation for Dr.Thomas [name]. Note will be routed back to referring provider/PCP.  CC: Left-sided chronic low back pain  HPI: Pt is an 87 y/o female c/o L-sided chronic low back pain. PCP dx pt w/ lumbosacral spondylosis. Pt locates pain to her low back, L>R.  She had a lumbar spine surgery in 2012.  Radiates:  No LE Numbness/tingling: No LE Weakness: No Aggravates: constant pain Treatments tried: hydrocodone-acetaminophen  Dx imaging: 12/25/18 L-spine XR; L-spine MRI-02/27/13  Past medical history, Surgical history, Family history, Social history, Allergies, and medications have been entered into the medical record, reviewed.   Review of Systems: No new headache, visual changes, nausea, vomiting, diarrhea, constipation, dizziness, abdominal pain, skin rash, fevers, chills, night sweats, weight loss, swollen lymph nodes, body aches, joint swelling, muscle aches, chest pain, shortness of breath, mood changes, visual or auditory hallucinations.   Objective:    Vitals:   09/25/20 1243  BP: 140/70  Pulse: (!) 55  SpO2: 99%   General: Well Developed, well nourished, and in no acute distress.  Neuro/Psych: Alert and oriented x3, extra-ocular muscles intact, able to move all 4 extremities, sensation grossly intact. Skin: Warm and dry, no rashes noted.  Respiratory: Not using accessory muscles, speaking in full sentences, trachea midline.  Cardiovascular: Pulses palpable, no extremity edema. Abdomen: Does not appear distended. MSK: L-spine normal-appearing Nontender midline.  Tender palpation perispinal musculature. Decreased lumbar motion. Antalgic gait. Lower extremity strength and reflexes are intact.  Lab and Radiology Results  X-ray images lumbar spine obtained today personally and independently interpreted. Degenerative scoliosis pattern present. Intact  surgical hardware at L4-L5 with fusion. Diffuse multilevel DDD present. Await formal radiology review  Impression and Recommendations:    Assessment and Plan: 87 y.o. female with chronic low back pain.  Patient has multiple causes of pain including degenerative changes in her lumbar spine, probable postlaminectomy pain syndrome, and fundamentally weakness of the core and lumbar musculature.  In the remote past she has had what sounds like trials of facet joint injections and a limited trial of physical therapy.  She currently is receiving opiate-based medication for pain control which is somewhat helpful but ultimately not as helpful as we would like.    Discussed options.  Plan for referral to physical therapy for aquatic physical therapy.  I think she will tolerate that pretty well which may help her gain some mobility and core strengthening in a way that is unlikely to hurt her.  Recheck back in about a month.  Consider MRI at that point for potential facet injection planning.[name]  PDMP not reviewed this encounter. Orders Placed This Encounter  Procedures  . DG Lumbar Spine 2-3 Views    Standing Status:   Future    Number of Occurrences:   1    Standing Expiration Date:   09/25/2021    Order Specific Question:   Reason for Exam (SYMPTOM  OR DIAGNOSIS REQUIRED)    Answer:   eval lumbar    Order Specific Question:   Preferred imaging location?    Answer:   [name]  . Ambulatory referral to Physical Therapy    Referral Priority:   Routine    Referral Type:   Physical Medicine    Referral Reason:   Specialty Services Required    Requested Specialty:   Physical Therapy   No orders  of the defined types were placed in this encounter.   Discussed warning signs or symptoms. Please see discharge instructions. Patient expresses understanding.   The above documentation has been reviewed and is accurate and complete Lynne Leader, M.D.

## 2020-09-25 ENCOUNTER — Other Ambulatory Visit: Payer: Self-pay

## 2020-09-25 ENCOUNTER — Ambulatory Visit (INDEPENDENT_AMBULATORY_CARE_PROVIDER_SITE_OTHER): Payer: PPO

## 2020-09-25 ENCOUNTER — Ambulatory Visit (INDEPENDENT_AMBULATORY_CARE_PROVIDER_SITE_OTHER): Payer: PPO | Admitting: Family Medicine

## 2020-09-25 ENCOUNTER — Encounter: Payer: Self-pay | Admitting: Family Medicine

## 2020-09-25 VITALS — BP 140/70 | HR 55 | Ht 70.0 in | Wt 216.4 lb

## 2020-09-25 DIAGNOSIS — M545 Low back pain, unspecified: Secondary | ICD-10-CM | POA: Diagnosis not present

## 2020-09-25 DIAGNOSIS — M961 Postlaminectomy syndrome, not elsewhere classified: Secondary | ICD-10-CM | POA: Diagnosis not present

## 2020-09-25 NOTE — Patient Instructions (Addendum)
Thank you for coming in today.  I've referred you to Physical Therapy.  Let us know if you don't hear from them in one week.  Please get an Xray today before you leave  Recheck in 1 month  Try heating pad.

## 2020-09-26 ENCOUNTER — Telehealth: Payer: Self-pay | Admitting: Internal Medicine

## 2020-09-26 NOTE — Progress Notes (Signed)
X-ray lumbar spine shows scoliosis and evidence of fusion at L4-L5.  Lots of arthritis changes are present however.

## 2020-09-26 NOTE — Telephone Encounter (Signed)
  HYDROcodone-acetaminophen (NORCO/VICODIN) 5-325 MG tablet [facility] - [place], [name] - [address] Phone:  [phone]  Fax:  [phone]     Last seen-11.08.21 Next apt- 05.09.22

## 2020-09-29 ENCOUNTER — Telehealth: Payer: Self-pay | Admitting: Internal Medicine

## 2020-09-29 ENCOUNTER — Other Ambulatory Visit: Payer: Self-pay | Admitting: Internal Medicine

## 2020-09-29 DIAGNOSIS — M47817 Spondylosis without myelopathy or radiculopathy, lumbosacral region: Secondary | ICD-10-CM

## 2020-09-29 DIAGNOSIS — M961 Postlaminectomy syndrome, not elsewhere classified: Secondary | ICD-10-CM

## 2020-09-29 DIAGNOSIS — M170 Bilateral primary osteoarthritis of knee: Secondary | ICD-10-CM

## 2020-09-29 MED ORDER — HYDROCODONE-ACETAMINOPHEN 5-325 MG PO TABS: 1.0000 | ORAL_TABLET | Freq: Four times a day (QID) | ORAL | 0 refills | Status: DC | PRN

## 2020-09-29 NOTE — Telephone Encounter (Signed)
  HYDROcodone-acetaminophen (NORCO/VICODIN) 5-325 MG tablet [facility] [phone] - [place], [place] AT [address] Phone:  [phone]  Fax:  [phone]     Pharmacy calling, requesting this medication sent to the pharmacy above because they cannot get it in for a week.

## 2020-09-29 NOTE — Telephone Encounter (Signed)
Error

## 2020-09-30 ENCOUNTER — Telehealth: Payer: Self-pay | Admitting: Cardiovascular Disease

## 2020-09-30 ENCOUNTER — Other Ambulatory Visit: Payer: Self-pay | Admitting: Internal Medicine

## 2020-09-30 DIAGNOSIS — M47817 Spondylosis without myelopathy or radiculopathy, lumbosacral region: Secondary | ICD-10-CM

## 2020-09-30 DIAGNOSIS — M170 Bilateral primary osteoarthritis of knee: Secondary | ICD-10-CM

## 2020-09-30 DIAGNOSIS — M961 Postlaminectomy syndrome, not elsewhere classified: Secondary | ICD-10-CM

## 2020-09-30 MED ORDER — HYDROCODONE-ACETAMINOPHEN 5-325 MG PO TABS: 1.0000 | ORAL_TABLET | Freq: Four times a day (QID) | ORAL | 0 refills | Status: DC | PRN

## 2020-09-30 NOTE — Progress Notes (Signed)
A call was made to Dr. Blenda Mounts office for refill to be sent to [facility] for rosuvastatin. A message was taken and sent to Dr. Blenda Mounts nurse.  [place] Pharmacist Assistant [phone]

## 2020-09-30 NOTE — Telephone Encounter (Signed)
*  STAT* If patient is at the pharmacy, call can be transferred to refill team.   1. Which medications need to be refilled? (please list name of each medication and dose if known) rosuvastatin (CRESTOR) 10 MG tablet  2. Which pharmacy/location (including street and city if local pharmacy) is medication to be sent to? rosuvastatin (CRESTOR) 10 MG tablet  3. Do they need a 30 day or 90 day supply? [place]

## 2020-10-01 MED ORDER — ROSUVASTATIN CALCIUM 10 MG PO TABS: 10.0000 mg | ORAL_TABLET | Freq: Every day | ORAL | 2 refills | Status: DC

## 2020-10-08 ENCOUNTER — Other Ambulatory Visit: Payer: Self-pay

## 2020-10-08 ENCOUNTER — Ambulatory Visit (HOSPITAL_BASED_OUTPATIENT_CLINIC_OR_DEPARTMENT_OTHER): Payer: PPO | Attending: Family Medicine | Admitting: Physical Therapy

## 2020-10-08 DIAGNOSIS — M6281 Muscle weakness (generalized): Secondary | ICD-10-CM | POA: Diagnosis not present

## 2020-10-08 DIAGNOSIS — M545 Low back pain, unspecified: Secondary | ICD-10-CM | POA: Diagnosis not present

## 2020-10-08 DIAGNOSIS — R2689 Other abnormalities of gait and mobility: Secondary | ICD-10-CM | POA: Diagnosis not present

## 2020-10-08 DIAGNOSIS — G8929 Other chronic pain: Secondary | ICD-10-CM | POA: Diagnosis not present

## 2020-10-08 DIAGNOSIS — R293 Abnormal posture: Secondary | ICD-10-CM | POA: Diagnosis not present

## 2020-10-08 NOTE — Therapy (Signed)
[place], [name], [address] Phone: [phone]   Fax:  [phone]  Physical Therapy Evaluation  Patient Details  Name: Claudia [name] MRN: [id number] Date of Birth: 10-25-1933 Referring Provider (PT): Gregor Hams, MD   Encounter Date: 10/08/2020   PT End of Session - 10/08/20 1111    Visit Number 1    Number of Visits 17    Date for PT Re-Evaluation 12/03/20    Authorization Type [employer]    PT Start Time 1105    PT Stop Time 1145    PT Time Calculation (min) 40 min    Activity Tolerance Patient tolerated treatment well;No increased pain    Behavior During Therapy WFL for tasks assessed/performed           Past Medical History:  Diagnosis Date  . Abnormality of gait   . Brachial neuritis or radiculitis NOS   . Degeneration of lumbar or lumbosacral intervertebral disc   . Essential hypertension, benign   . Gouty arthropathy   . Lumbago   . Obesity   . Osteoarthritis   . Pure hypercholesterolemia   . Unspecified hypothyroidism     Past Surgical History:  Procedure Laterality Date  . ABDOMINAL HYSTERECTOMY  03/13/2010  . BACK SURGERY    . LUMBAR LAMINECTOMY  03/13/2010  . right knee replacement  03/13/2010    There were no vitals filed for this visit.    Subjective Assessment - 10/08/20 1108    Subjective Pt reports issues with her back since 2012 and had surgery x2 (2 screws). Pt is in so much pain but is able to perform her home ADLs.Typically does not use cane at home. Pt sleeps on her side and on her back.    Limitations Standing;Walking;House hold activities    How long can you sit comfortably? ~45 min to an hour    How long can you stand comfortably? ~10 minutes if not doing anything else; feels loss of balance    How long can you walk comfortably? Able to walk throughout the house    Diagnostic tests x-ray: Scoliosis of the lumbar spine with posterior interbody  fusion at  L4-L5. Degenerative changes.    Patient Stated Goals Reduce pain medicine, walk without a/d and walk outside    Currently in Pain? Yes    Pain Score 7     Pain Location Back    Pain Orientation Mid;Lower    Pain Descriptors / Indicators Aching    Pain Type Chronic pain    Pain Radiating Towards No    Pain Onset More than a month ago    Pain Frequency Constant    Aggravating Factors  TENS unit, activity    Pain Relieving Factors No    Effect of Pain on Daily Activities Difficulty performing ADLs              [facility] PT Assessment - 10/08/20 0001      Assessment   Medical Diagnosis M96.1 (ICD-10-CM) - Postlaminectomy syndrome, lumbar region    Referring Provider (PT) Gregor Hams, MD    Prior Therapy 3-4 years ago for her back      Precautions   Precautions None      Restrictions   Weight Bearing Restrictions No      Balance Screen   Has the patient fallen in the past 6 months No      [place] residence  Living Arrangements Spouse/significant other    Available Help at Discharge Family    Type of [place] Access Level entry    [place] - 2 wheels      Prior Function   Level of [place] Retired    Leisure Walking      Observation/Other Assessments   Focus on Therapeutic Outcomes (FOTO)  n/a      Functional Tests   Functional tests Sit to Stand      Sit to Stand   Comments 5x STS with UE: 23 sec      Posture/Postural Control   Posture/Postural Control Postural limitations    Postural Limitations Forward head;Decreased lumbar lordosis;Flexed trunk;Anterior pelvic tilt      ROM / Strength   AROM / PROM / Strength AROM;Strength      AROM   AROM Assessment Site Lumbar    Lumbar Flexion 50%   Mostly from hips   Lumbar Extension 0%   Limited due to FOF   Lumbar - Right Side Bend 50%   Unable to perform without flexion   Lumbar - Left  Side Bend 50%   Unable to perform without flexion   Lumbar - Right Rotation 50%    Lumbar - Left Rotation 50%      Strength   Strength Assessment Site Hip;Lumbar;Knee    Right/Left Hip Right;Left    Right Hip Flexion 4-/5    Right Hip Extension 2+/5    Right Hip External Rotation  4+/5    Right Hip Internal Rotation 4+/5    Right Hip ABduction 2+/5    Left Hip Flexion 3+/5    Left Hip Extension 2+/5    Left Hip External Rotation 4-/5    Left Hip Internal Rotation 4-/5    Left Hip ABduction 2+/5    Right/Left Knee Right;Left    Right Knee Flexion 4+/5    Right Knee Extension 4+/5    Left Knee Flexion 4+/5    Left Knee Extension 3+/5      Special Tests    Special Tests Lumbar    Lumbar Tests FABER test;Slump Test;Prone Knee Bend Test;Straight Leg Raise      FABER test   findings Positive    Side Right    Comment Feels in the back on R; feels in groin on L      Slump test   Findings Positive    Side Left    Comment Feels in anterior thigh      Prone Knee Bend Test   Findings Negative    Comment Stretch in quads      Straight Leg Raise   Findings Positive    Side  Left    Comment Feels in back      Ambulation/Gait   Ambulation Distance (Feet) 150 Feet    Assistive device Small based quad cane    Gait Pattern Step-through pattern;Trunk flexed;Abducted - left;Abducted- right    Ambulation Surface Level;Indoor                      Objective measurements completed on examination: See above findings.               PT Education - 10/08/20 1306    Education Details Exam findings, POC, aquatic therapy wait list    Person(s) Educated Patient    Methods Explanation;Verbal cues;Handout  Comprehension Verbalized understanding;Tactile cues required;Returned demonstration               PT Long Term Goals - 10/08/20 1313      PT LONG TERM GOAL #1   Title Pt will be independent with HEP    Time 8    Period Weeks    Status New    Target  Date 12/03/20      PT LONG TERM GOAL #2   Title Pt will be able to improve 5x STS to <15 sec to demo improved functional strength and balance    Time 8    Period Weeks    Status New    Target Date 12/03/20      PT LONG TERM GOAL #3   Title Pt will be able to walk 400' without a/d per pt's goals to improve amb without cane    Baseline Unsteady with use of cane    Time 8    Period Weeks    Status New    Target Date 12/03/20      PT LONG TERM GOAL #4   Title Pt will report decrease in pain by at least 50% with her home activity    Baseline 7/10    Time 8    Period Weeks    Status New    Target Date 12/03/20                  Plan - 10/08/20 1307    Clinical Impression Statement Pt is an 87 y/o F presenting to OPPT due to chronic low back pain with history of surgery in 2012. On assessment, pt demos L>R hip weakness, core weakness, decreased balance, endurance, and forward flexed posture. Pt would benefit from PT to improve gross strength and mobility for improved pain management with home activities and return to leisure activities such as walking.    Personal Factors and Comorbidities Age;Fitness;Time since onset of injury/illness/exacerbation;Past/Current Experience    Examination-Activity Limitations Bathing;Bed Mobility;Bend;Caring for Others;Carry;Dressing;Hygiene/Grooming;Lift;Locomotion Level;Sit;Stand;Toileting;Transfers    Examination-Participation Restrictions Cleaning;Community Activity;Shop    Stability/Clinical Decision Making Evolving/Moderate complexity    Clinical Decision Making Moderate    Rehab Potential Good    PT Frequency 2x / week    PT Duration 8 weeks    PT Treatment/Interventions ADLs/Self Care Home Management;Aquatic Therapy;Cryotherapy;Electrical Stimulation;Iontophoresis 4mg /ml Dexamethasone;Moist Heat;DME Instruction;Gait training;Stair training;Functional mobility training;Therapeutic activities;Therapeutic exercise;Balance  training;Neuromuscular re-education;Patient/family education;Manual techniques;Passive range of motion;Dry needling;Taping    PT Next Visit Plan Assess response to HEP. Manual therapy as indicated for low back. Work on Insurance claims handler and core/hip strengthening.    PT Home Exercise Plan Access Code: [email or web address]    Consulted and Agree with Plan of Care Patient           Patient will benefit from skilled therapeutic intervention in order to improve the following deficits and impairments:  Abnormal gait,Decreased range of motion,Difficulty walking,Increased fascial restricitons,Decreased endurance,Decreased activity tolerance,Pain,Decreased balance,Hypomobility,Improper body mechanics,Decreased mobility,Decreased strength,Postural dysfunction  Visit Diagnosis: Muscle weakness (generalized)  Chronic bilateral low back pain without sciatica  Abnormal posture  Other abnormalities of gait and mobility     Problem List Patient Active Problem List   Diagnosis Date Noted  . Stage 3b chronic kidney disease ([place]) 05/06/2020  . Chronic left-sided low back pain without sciatica 12/25/2018  . Spinal stenosis in cervical region 05/12/2016  . Bradycardia 01/01/2016  . Therapeutic opioid-induced constipation (OIC) 01/01/2016  . Chronic idiopathic constipation 09/11/2013  . Lumbosacral spondylosis without myelopathy 04/09/2013  .  Postlaminectomy syndrome, lumbar region 04/09/2013  . Routine general medical examination at a health care facility 02/01/2012  . Neuropathy, peripheral 08/04/2011  . Pure hypercholesterolemia 08/04/2011  . Essential hypertension, benign 08/04/2011  . Hypothyroidism 08/04/2011  . DJD (degenerative joint disease) of knee 08/04/2011  . Gout 08/04/2011    Jakota Manthei April Ma L Wadsworth Skolnick PT, DPT 10/08/2020, 1:17 PM  [facility] [address] [place], [name], [address] Phone: [phone]   Fax:   [phone]  Name: Claudia [name] MRN: [id number] Date of Birth: Jul 09, 1933

## 2020-10-08 NOTE — Patient Instructions (Signed)
Access Code: [email or web address] URL: [email or web address] Date: 10/08/2020 Prepared by: Estill Bamberg April Thurnell Garbe  Exercises Seated Hip Abduction with Resistance - 1 x daily - 7 x weekly - 2 sets - 10 reps Seated [place] - 1 x daily - 7 x weekly - 2 sets - 10 reps Seated Abdominal Press into [employer] - 1 x daily - 7 x weekly - 5 reps - 5 sec hold Modified Thomas Stretch - 1 x daily - 7 x weekly - 3 sets - 20-30 sec hold

## 2020-10-14 ENCOUNTER — Ambulatory Visit (HOSPITAL_BASED_OUTPATIENT_CLINIC_OR_DEPARTMENT_OTHER): Payer: PPO | Admitting: Physical Therapy

## 2020-10-14 ENCOUNTER — Encounter (HOSPITAL_BASED_OUTPATIENT_CLINIC_OR_DEPARTMENT_OTHER): Payer: Self-pay | Admitting: Physical Therapy

## 2020-10-14 ENCOUNTER — Other Ambulatory Visit: Payer: Self-pay

## 2020-10-14 DIAGNOSIS — M545 Low back pain, unspecified: Secondary | ICD-10-CM

## 2020-10-14 DIAGNOSIS — G8929 Other chronic pain: Secondary | ICD-10-CM

## 2020-10-14 DIAGNOSIS — R293 Abnormal posture: Secondary | ICD-10-CM

## 2020-10-14 DIAGNOSIS — R2689 Other abnormalities of gait and mobility: Secondary | ICD-10-CM

## 2020-10-14 DIAGNOSIS — M6281 Muscle weakness (generalized): Secondary | ICD-10-CM

## 2020-10-14 NOTE — Therapy (Signed)
[place], [name], [address] Phone: [phone]   Fax:  [phone]  Physical Therapy Treatment  Patient Details  Name: Claudia [name] MRN: [id number] Date of Birth: 1934/06/18 Referring Provider (PT): Gregor Hams, MD   Encounter Date: 10/14/2020   PT End of Session - 10/14/20 1141    Visit Number 2    Number of Visits 17    Date for PT Re-Evaluation 12/03/20    Authorization Type [employer]    PT Start Time 6378    PT Stop Time 1230    PT Time Calculation (min) 45 min    Activity Tolerance Patient tolerated treatment well;No increased pain    Behavior During Therapy WFL for tasks assessed/performed           Past Medical History:  Diagnosis Date  . Abnormality of gait   . Brachial neuritis or radiculitis NOS   . Degeneration of lumbar or lumbosacral intervertebral disc   . Essential hypertension, benign   . Gouty arthropathy   . Lumbago   . Obesity   . Osteoarthritis   . Pure hypercholesterolemia   . Unspecified hypothyroidism     Past Surgical History:  Procedure Laterality Date  . ABDOMINAL HYSTERECTOMY  03/13/2010  . BACK SURGERY    . LUMBAR LAMINECTOMY  03/13/2010  . right knee replacement  03/13/2010    There were no vitals filed for this visit.   Subjective Assessment - 10/14/20 1147    Subjective Pt reports she didn't hurt leaving last week. Pt states she's been able to do the exercises.    Limitations Standing;Walking;House hold activities    How long can you sit comfortably? ~45 min to an hour    How long can you stand comfortably? ~10 minutes if not doing anything else; feels loss of balance    How long can you walk comfortably? Able to walk throughout the house    Diagnostic tests x-ray: Scoliosis of the lumbar spine with posterior interbody fusion at  L4-L5. Degenerative changes.    Patient Stated Goals Reduce pain medicine, walk without a/d and walk  outside                             [facility] Adult PT Treatment/Exercise - 10/14/20 0001      Exercises   Exercises Lumbar      Lumbar Exercises: Stretches   Hip Flexor Stretch Left;Right;30 seconds    Piriformis Stretch Right;Left;30 seconds;2 reps      Lumbar Exercises: Aerobic   Nustep L4 x 5 min      Lumbar Exercises: Seated   Other Seated Lumbar Exercises Hip flexion red tband 2x10    Other Seated Lumbar Exercises scapular retraction x10, neck retraction x10      Lumbar Exercises: Supine   Pelvic Tilt 20 reps    Pelvic Tilt Limitations feet on pball    Bridge Compliant;20 reps    Straight Leg Raise 10 reps    Straight Leg Raises Limitations feet on pball                       PT Long Term Goals - 10/08/20 1313      PT LONG TERM GOAL #1   Title Pt will be independent with HEP    Time 8    Period Weeks    Status New    Target Date  12/03/20      PT LONG TERM GOAL #2   Title Pt will be able to improve 5x STS to <15 sec to demo improved functional strength and balance    Time 8    Period Weeks    Status New    Target Date 12/03/20      PT LONG TERM GOAL #3   Title Pt will be able to walk 400' without a/d per pt's goals to improve amb without cane    Baseline Unsteady with use of cane    Time 8    Period Weeks    Status New    Target Date 12/03/20      PT LONG TERM GOAL #4   Title Pt will report decrease in pain by at least 50% with her home activity    Baseline 7/10    Time 8    Period Weeks    Status New    Target Date 12/03/20                 Plan - 10/14/20 1237    Clinical Impression Statement Treatment focused on progressing pt's hip and core strengthening exercises. Hip abduction with clamshell remains most challenging. Pt reports no pain after performing exercises.    Personal Factors and Comorbidities Age;Fitness;Time since onset of injury/illness/exacerbation;Past/Current Experience    Examination-Activity  Limitations Bathing;Bed Mobility;Bend;Caring for Others;Carry;Dressing;Hygiene/Grooming;Lift;Locomotion Level;Sit;Stand;Toileting;Transfers    Examination-Participation Restrictions Cleaning;Community Activity;Shop    Stability/Clinical Decision Making Evolving/Moderate complexity    Rehab Potential Good    PT Frequency 2x / week    PT Duration 8 weeks    PT Treatment/Interventions ADLs/Self Care Home Management;Aquatic Therapy;Cryotherapy;Electrical Stimulation;Iontophoresis 4mg /ml Dexamethasone;Moist Heat;DME Instruction;Gait training;Stair training;Functional mobility training;Therapeutic activities;Therapeutic exercise;Balance training;Neuromuscular re-education;Patient/family education;Manual techniques;Passive range of motion;Dry needling;Taping    PT Next Visit Plan Assess response to HEP. Manual therapy as indicated for low back. Work on and progress lumbar/pelvic/hip ROM/stretching and core/hip strengthening.    PT Home Exercise Plan Access Code: [email or web address]    Consulted and Agree with Plan of Care Patient           Patient will benefit from skilled therapeutic intervention in order to improve the following deficits and impairments:  Abnormal gait,Decreased range of motion,Difficulty walking,Increased fascial restricitons,Decreased endurance,Decreased activity tolerance,Pain,Decreased balance,Hypomobility,Improper body mechanics,Decreased mobility,Decreased strength,Postural dysfunction  Visit Diagnosis: Muscle weakness (generalized)  Chronic bilateral low back pain without sciatica  Abnormal posture  Other abnormalities of gait and mobility     Problem List Patient Active Problem List   Diagnosis Date Noted  . Stage 3b chronic kidney disease ([place]) 05/06/2020  . Chronic left-sided low back pain without sciatica 12/25/2018  . Spinal stenosis in cervical region 05/12/2016  . Bradycardia 01/01/2016  . Therapeutic opioid-induced constipation (OIC) 01/01/2016  . Chronic  idiopathic constipation 09/11/2013  . Lumbosacral spondylosis without myelopathy 04/09/2013  . Postlaminectomy syndrome, lumbar region 04/09/2013  . Routine general medical examination at a health care facility 02/01/2012  . Neuropathy, peripheral 08/04/2011  . Pure hypercholesterolemia 08/04/2011  . Essential hypertension, benign 08/04/2011  . Hypothyroidism 08/04/2011  . DJD (degenerative joint disease) of knee 08/04/2011  . Gout 08/04/2011    Ziad Maye April Ma L Elizabethann Lackey PT, DPT 10/14/2020, 12:39 PM  [facility] [address] [place], [name], [address] Phone: [phone]   Fax:  [phone]  Name: Claudia [name] MRN: [id number] Date of Birth: [birth year]

## 2020-10-16 ENCOUNTER — Ambulatory Visit (HOSPITAL_BASED_OUTPATIENT_CLINIC_OR_DEPARTMENT_OTHER): Payer: PPO | Admitting: Physical Therapy

## 2020-10-16 ENCOUNTER — Other Ambulatory Visit: Payer: Self-pay

## 2020-10-16 DIAGNOSIS — G8929 Other chronic pain: Secondary | ICD-10-CM

## 2020-10-16 DIAGNOSIS — M6281 Muscle weakness (generalized): Secondary | ICD-10-CM

## 2020-10-16 DIAGNOSIS — R2689 Other abnormalities of gait and mobility: Secondary | ICD-10-CM

## 2020-10-16 DIAGNOSIS — R293 Abnormal posture: Secondary | ICD-10-CM

## 2020-10-16 DIAGNOSIS — M545 Low back pain, unspecified: Secondary | ICD-10-CM

## 2020-10-16 NOTE — Therapy (Signed)
[name], [name], [address] Phone: [phone]   Fax:  [phone]  Physical Therapy Treatment  Patient Details  Name: Claudia [name] MRN: [id number] Date of Birth: 03/06/1934 Referring Provider (PT): Gregor Hams, MD   Encounter Date: 10/16/2020   PT End of Session - 10/16/20 1054    Visit Number 3    Number of Visits 17    Date for PT Re-Evaluation 12/03/20    Authorization Type [employer]    PT Start Time 1055    PT Stop Time 1140    PT Time Calculation (min) 45 min    Activity Tolerance Patient tolerated treatment well;No increased pain    Behavior During Therapy WFL for tasks assessed/performed           Past Medical History:  Diagnosis Date  . Abnormality of gait   . Brachial neuritis or radiculitis NOS   . Degeneration of lumbar or lumbosacral intervertebral disc   . Essential hypertension, benign   . Gouty arthropathy   . Lumbago   . Obesity   . Osteoarthritis   . Pure hypercholesterolemia   . Unspecified hypothyroidism     Past Surgical History:  Procedure Laterality Date  . ABDOMINAL HYSTERECTOMY  03/13/2010  . BACK SURGERY    . LUMBAR LAMINECTOMY  03/13/2010  . right knee replacement  03/13/2010    There were no vitals filed for this visit.   Subjective Assessment - 10/16/20 1058    Subjective Pt states she is feeling a little better day by day. Pt reports she's been able to do some of the exercises.    Limitations Standing;Walking;House hold activities    How long can you sit comfortably? ~45 min to an hour    How long can you stand comfortably? ~10 minutes if not doing anything else; feels loss of balance    How long can you walk comfortably? Able to walk throughout the house    Diagnostic tests x-ray: Scoliosis of the lumbar spine with posterior interbody fusion at  L4-L5. Degenerative changes.    Patient Stated Goals Reduce pain medicine, walk without  a/d and walk outside    Currently in Pain? Yes    Pain Score 5     Pain Location Back    Pain Orientation Mid;Lower    Pain Descriptors / Indicators Aching    Pain Type Chronic pain    Pain Onset More than a month ago    Pain Frequency Constant                             [facility] Adult PT Treatment/Exercise - 10/16/20 0001      Lumbar Exercises: Stretches   Other Lumbar Stretch Exercise "L" stretch x30 sec, with side flexion x30 sec bilat    Other Lumbar Stretch Exercise Open/close book 5x10 sec      Lumbar Exercises: Aerobic   Nustep L4 x 5 min      Lumbar Exercises: Standing   Other Standing Lumbar Exercises Scapular retraction x10, neck retraction into wall x10, PPT against wall x10      Lumbar Exercises: Seated   Other Seated Lumbar Exercises ab setting red pball x10    Other Seated Lumbar Exercises scapular retraction x10, neck retraction x10      Lumbar Exercises: Supine   Pelvic Tilt 10 reps    Pelvic Tilt Limitations feet on pball  Other Supine Lumbar Exercises Feet on pball hip flexion/extension 2x10 with PPT      Lumbar Exercises: Sidelying   Clam Both;20 reps                       PT Long Term Goals - 10/08/20 1313      PT LONG TERM GOAL #1   Title Pt will be independent with HEP    Time 8    Period Weeks    Status New    Target Date 12/03/20      PT LONG TERM GOAL #2   Title Pt will be able to improve 5x STS to <15 sec to demo improved functional strength and balance    Time 8    Period Weeks    Status New    Target Date 12/03/20      PT LONG TERM GOAL #3   Title Pt will be able to walk 400' without a/d per pt's goals to improve amb without cane    Baseline Unsteady with use of cane    Time 8    Period Weeks    Status New    Target Date 12/03/20      PT LONG TERM GOAL #4   Title Pt will report decrease in pain by at least 50% with her home activity    Baseline 7/10    Time 8    Period Weeks    Status New     Target Date 12/03/20                 Plan - 10/16/20 1113    Clinical Impression Statement Treatment focused on reviewing HEP and modifying/progressing as able. Pt with improving clamshell. Initiated postural stablization in standing.    Personal Factors and Comorbidities Age;Fitness;Time since onset of injury/illness/exacerbation;Past/Current Experience    Examination-Activity Limitations Bathing;Bed Mobility;Bend;Caring for Others;Carry;Dressing;Hygiene/Grooming;Lift;Locomotion Level;Sit;Stand;Toileting;Transfers    Examination-Participation Restrictions Cleaning;Community Activity;Shop    Stability/Clinical Decision Making Evolving/Moderate complexity    Rehab Potential Good    PT Frequency 2x / week    PT Duration 8 weeks    PT Treatment/Interventions ADLs/Self Care Home Management;Aquatic Therapy;Cryotherapy;Electrical Stimulation;Iontophoresis 4mg /ml Dexamethasone;Moist Heat;DME Instruction;Gait training;Stair training;Functional mobility training;Therapeutic activities;Therapeutic exercise;Balance training;Neuromuscular re-education;Patient/family education;Manual techniques;Passive range of motion;Dry needling;Taping    PT Next Visit Plan Assess response to HEP. Manual therapy as indicated for low back. Work on and progress lumbar/pelvic/hip ROM/stretching and core/hip strengthening.    PT Home Exercise Plan Access Code: [email or web address]    Consulted and Agree with Plan of Care Patient           Patient will benefit from skilled therapeutic intervention in order to improve the following deficits and impairments:  Abnormal gait,Decreased range of motion,Difficulty walking,Increased fascial restricitons,Decreased endurance,Decreased activity tolerance,Pain,Decreased balance,Hypomobility,Improper body mechanics,Decreased mobility,Decreased strength,Postural dysfunction  Visit Diagnosis: Muscle weakness (generalized)  Chronic bilateral low back pain without sciatica  Abnormal  posture  Other abnormalities of gait and mobility     Problem List Patient Active Problem List   Diagnosis Date Noted  . Stage 3b chronic kidney disease ([place]) 05/06/2020  . Chronic left-sided low back pain without sciatica 12/25/2018  . Spinal stenosis in cervical region 05/12/2016  . Bradycardia 01/01/2016  . Therapeutic opioid-induced constipation (OIC) 01/01/2016  . Chronic idiopathic constipation 09/11/2013  . Lumbosacral spondylosis without myelopathy 04/09/2013  . Postlaminectomy syndrome, lumbar region 04/09/2013  . Routine general medical examination at a health care facility 02/01/2012  . Neuropathy, peripheral 08/04/2011  .  Pure hypercholesterolemia 08/04/2011  . Essential hypertension, benign 08/04/2011  . Hypothyroidism 08/04/2011  . DJD (degenerative joint disease) of knee 08/04/2011  . Gout 08/04/2011    [facility] [address] PT, DPT 10/16/2020, 12:49 PM  [facility] [place], [name], [address] Phone: [phone]   Fax:  [phone]  Name: HADASSAH RANA MRN: [id number] Date of Birth: 03-Oct-1933

## 2020-10-20 ENCOUNTER — Other Ambulatory Visit: Payer: Self-pay | Admitting: Internal Medicine

## 2020-10-20 DIAGNOSIS — I10 Essential (primary) hypertension: Secondary | ICD-10-CM

## 2020-10-21 ENCOUNTER — Other Ambulatory Visit: Payer: Self-pay

## 2020-10-21 ENCOUNTER — Ambulatory Visit (HOSPITAL_BASED_OUTPATIENT_CLINIC_OR_DEPARTMENT_OTHER): Payer: PPO | Admitting: Physical Therapy

## 2020-10-21 DIAGNOSIS — M545 Low back pain, unspecified: Secondary | ICD-10-CM

## 2020-10-21 DIAGNOSIS — M6281 Muscle weakness (generalized): Secondary | ICD-10-CM | POA: Diagnosis not present

## 2020-10-21 DIAGNOSIS — R293 Abnormal posture: Secondary | ICD-10-CM

## 2020-10-21 DIAGNOSIS — G8929 Other chronic pain: Secondary | ICD-10-CM

## 2020-10-21 DIAGNOSIS — R2689 Other abnormalities of gait and mobility: Secondary | ICD-10-CM

## 2020-10-22 ENCOUNTER — Encounter (HOSPITAL_BASED_OUTPATIENT_CLINIC_OR_DEPARTMENT_OTHER): Payer: Self-pay | Admitting: Physical Therapy

## 2020-10-22 ENCOUNTER — Telehealth: Payer: Self-pay | Admitting: Pharmacist

## 2020-10-22 NOTE — Therapy (Signed)
[place], [name], [address] Phone: [phone]   Fax:  [phone]  Physical Therapy Treatment  Patient Details  Name: Claudia [name] MRN: [id number] Date of Birth: [birth year] Referring Provider (PT): Gregor Hams, MD   Encounter Date: 10/21/2020   PT End of Session - 10/22/20 0812    Visit Number 4    Number of Visits 17    Date for PT Re-Evaluation 12/03/20    Authorization Type [employer]    PT Start Time 1093    PT Stop Time 1230    PT Time Calculation (min) 42 min    Activity Tolerance Patient tolerated treatment well;No increased pain    Behavior During Therapy WFL for tasks assessed/performed           Past Medical History:  Diagnosis Date  . Abnormality of gait   . Brachial neuritis or radiculitis NOS   . Degeneration of lumbar or lumbosacral intervertebral disc   . Essential hypertension, benign   . Gouty arthropathy   . Lumbago   . Obesity   . Osteoarthritis   . Pure hypercholesterolemia   . Unspecified hypothyroidism     Past Surgical History:  Procedure Laterality Date  . ABDOMINAL HYSTERECTOMY  03/13/2010  . BACK SURGERY    . LUMBAR LAMINECTOMY  03/13/2010  . right knee replacement  03/13/2010    There were no vitals filed for this visit.   Subjective Assessment - 10/22/20 0807    Subjective Patient arrives for pool therapy. She reports the exercises have been helping. She is enjoying the land therapy but would like to continue with the land therapy. She has minor back pain today.    Limitations Standing;Walking;House hold activities    How long can you sit comfortably? ~45 min to an hour    How long can you stand comfortably? ~10 minutes if not doing anything else; feels loss of balance    How long can you walk comfortably? Able to walk throughout the house    Diagnostic tests x-ray: Scoliosis of the lumbar spine with posterior interbody fusion at  L4-L5.  Degenerative changes.    Patient Stated Goals Reduce pain medicine, walk without a/d and walk outside    Currently in Pain? Yes    Pain Score 3     Pain Location Back    Pain Orientation Lower    Pain Descriptors / Indicators Aching    Pain Type Chronic pain    Pain Radiating Towards no    Pain Onset More than a month ago    Pain Frequency Constant    Aggravating Factors  activity    Pain Relieving Factors no    Multiple Pain Sites No                  Pt seen for aquatic therapy today.  Treatment took place in water 3.25-4 ft in depth at the [facility] pool. Temp of water was 91.  Pt entered/exited the pool via stairs (step through pattern) independently with bilat rail.   Treatment: At first we performed just deep breathing and relaxation for 3-4 minutes at the wall to get patient to relax . She continued to have fear of the water but requested to ttry some exercises. She worked on standing with good posture at the wall 2x10; weight shift forward 2x10; standing straight with guarding 3x30 sec (patient very nervous ). Tried to have patient rest on noodles. The patient  had difficulty. Sat patient down. Patient had difficulty keeping her legs on the seat. Tried sit to stands but unable to get her feet to stay on the floor of the pool. performed LAQ 3x10 each leg. At that time the patient reported she would like to discontinue pool therapy. Min a to stand and exit the pool.      Pt requires buoyancy for support and to offload joints with strengthening exercises. Viscosity of the water is needed for resistance of strengthening; water current perturbations provides challenge to standing balance unsupported, requiring increased core activation.                    PT Education - 10/22/20 [phone]    Education Details benefits of pool therapy    Person(s) Educated Patient    Methods Explanation;Demonstration;Tactile cues;Verbal cues    Comprehension Verbalized  understanding;Returned demonstration;Tactile cues required;Verbal cues required               PT Long Term Goals - 10/08/20 1313      PT LONG TERM GOAL #1   Title Pt will be independent with HEP    Time 8    Period Weeks    Status New    Target Date 12/03/20      PT LONG TERM GOAL #2   Title Pt will be able to improve 5x STS to <15 sec to demo improved functional strength and balance    Time 8    Period Weeks    Status New    Target Date 12/03/20      PT LONG TERM GOAL #3   Title Pt will be able to walk 400' without a/d per pt's goals to improve amb without cane    Baseline Unsteady with use of cane    Time 8    Period Weeks    Status New    Target Date 12/03/20      PT LONG TERM GOAL #4   Title Pt will report decrease in pain by at least 50% with her home activity    Baseline 7/10    Time 8    Period Weeks    Status New    Target Date 12/03/20                 Plan - 10/22/20 8527    Clinical Impression Statement Patient had fear in  the watter. She was able to complete some exercise but was afraid to let go of the wall. Therapy tried to use floats and noodles to make her more comofrtable but she was still unable. She was able to do a few exercises sitting down but eventually decided she would like to discontinue pool therapy. She does see benefit in land therapy and would like to continue with her exercises in the clinic.    Personal Factors and Comorbidities Age;Fitness;Time since onset of injury/illness/exacerbation;Past/Current Experience    Examination-Activity Limitations Bathing;Bed Mobility;Bend;Caring for Others;Carry;Dressing;Hygiene/Grooming;Lift;Locomotion Level;Sit;Stand;Toileting;Transfers    Stability/Clinical Decision Making Evolving/Moderate complexity    Clinical Decision Making Moderate    Rehab Potential Good    PT Frequency 2x / week    PT Duration 8 weeks    PT Treatment/Interventions ADLs/Self Care Home Management;Aquatic  Therapy;Cryotherapy;Electrical Stimulation;Iontophoresis 4mg /ml Dexamethasone;Moist Heat;DME Instruction;Gait training;Stair training;Functional mobility training;Therapeutic activities;Therapeutic exercise;Balance training;Neuromuscular re-education;Patient/family education;Manual techniques;Passive range of motion;Dry needling;Taping    PT Next Visit Plan Assess response to HEP. Manual therapy as indicated for low back. Work on and progress lumbar/pelvic/hip ROM/stretching and  core/hip strengthening.    PT Home Exercise Plan Access Code: [email or web address]    Consulted and Agree with Plan of Care Patient           Patient will benefit from skilled therapeutic intervention in order to improve the following deficits and impairments:  Abnormal gait,Decreased range of motion,Difficulty walking,Increased fascial restricitons,Decreased endurance,Decreased activity tolerance,Pain,Decreased balance,Hypomobility,Improper body mechanics,Decreased mobility,Decreased strength,Postural dysfunction  Visit Diagnosis: Muscle weakness (generalized)  Chronic bilateral low back pain without sciatica  Abnormal posture  Other abnormalities of gait and mobility     Problem List Patient Active Problem List   Diagnosis Date Noted  . Stage 3b chronic kidney disease ([place]) 05/06/2020  . Chronic left-sided low back pain without sciatica 12/25/2018  . Spinal stenosis in cervical region 05/12/2016  . Bradycardia 01/01/2016  . Therapeutic opioid-induced constipation (OIC) 01/01/2016  . Chronic idiopathic constipation 09/11/2013  . Lumbosacral spondylosis without myelopathy 04/09/2013  . Postlaminectomy syndrome, lumbar region 04/09/2013  . Routine general medical examination at a health care facility 02/01/2012  . Neuropathy, peripheral 08/04/2011  . Pure hypercholesterolemia 08/04/2011  . Essential hypertension, benign 08/04/2011  . Hypothyroidism 08/04/2011  . DJD (degenerative joint disease) of knee 08/04/2011   . Gout 08/04/2011    Carney Living PT DPT  10/22/2020, 8:17 AM  [facility] [address] [place], [name], [address] Phone: [phone]   Fax:  [phone]  Name: Claudia [name] MRN: [id number] Date of Birth: [birth year]

## 2020-10-22 NOTE — Progress Notes (Addendum)
    Chronic Care Management Pharmacy Assistant   Name: Claudia [name]  MRN: [id number] DOB: February 01, 1934   Reason for Encounter: Medication Coordination Call    Recent office visits:  None ID  Recent consult visits:  09/25/20 Dr. Lynne Leader Sports Medicine  [name] visits:  None in previous 6 months  Medications: Outpatient Encounter Medications as of 10/22/2020  Medication Sig   apixaban (ELIQUIS) 5 MG TABS tablet Take 1 tablet (5 mg total) by mouth 2 (two) times daily.   cetirizine (ZYRTEC) 10 MG tablet Take 1 tablet (10 mg total) by mouth daily.   diclofenac Sodium (VOLTAREN) 1 % GEL Apply topically 4 (four) times daily.   diltiazem (CARDIZEM) 30 MG tablet TAKE 1 TABLET BY MOUTH EVERY 4 HOURS AS NEEDED FOR AFIB HEART RATE GREATER THAN 100 AS LONG AS BLOOD PRESSURE LESS THAN 100   HYDROcodone-acetaminophen (NORCO/VICODIN) 5-325 MG tablet Take 1 tablet by mouth every 6 (six) hours as needed for moderate pain.   indapamide (LOZOL) 1.25 MG tablet TAKE 1 TABLET(1.25 MG) BY MOUTH DAILY   linaclotide (LINZESS) 290 MCG CAPS capsule TAKE 1 CAPSULE BY MOUTH DAILY BEFORE BREAKFAST   rosuvastatin (CRESTOR) 10 MG tablet Take 1 tablet (10 mg total) by mouth daily.   vitamin C (ASCORBIC ACID) 500 MG tablet Take 500 mg by mouth daily.   vitamin E 180 MG (400 UNITS) capsule Take 400 Units by mouth daily.   No facility-administered encounter medications on file as of 10/22/2020.    Reviewed chart for medication changes ahead of medication coordination call.  No OVs, Consults, or hospital visits since last care coordination call/Pharmacist visit. (If appropriate, list visit date, provider name)  No medication changes indicated OR if recent visit, treatment plan here.  BP Readings from Last 3 Encounters:  09/25/20 140/70  09/09/20 130/62  06/11/20 (!) 142/66    Lab Results  Component Value Date   HGBA1C 5.8 (H) 08/24/2015     Patient obtains medications through Vials  90 Days     Patient is due for next adherence delivery on: 10/29/20. Called patient and reviewed medications and coordinated delivery.  This delivery to include: Rosuvastatin 10 mg 1 tab at breakfast   Patient declined the following medications  -Linzess has some on hand trying to come off -Indapamide last filled 10/20/20 at [facility] x 90 ds   Confirmed delivery date of 10/29/20, advised patient that pharmacy will contact them the morning of delivery.    Star Rating Drugs: Rosuvastatin 10/01/20 30 ds  Ethelene Hal Clinical Pharmacist Assistant [phone]

## 2020-10-22 NOTE — Progress Notes (Signed)
   I, Peterson Lombard, LAT, ATC acting as a scribe for Lynne Leader, MD.  Claudia [name] is a 87 y.o. female who presents to [place] [facility] today for f/u chronic L-sided low back pain. Pt had a laminectomy in 2012. Pt was last seen by Dr. Georgina Snell on 09/25/20 and was referred to PT for aquatic PT of which she's completed 4 visits. Today, pt reports aquatic therapy did not go well due to it making her "sick" and tensing up. Pt transitioned to traditional PT and has completed 4 visits. Pt notes improvement in pain. No numbness/tingling. Pt reports 50% improvement in pain.  Dx imaging: 09/25/20 L-spine XR  12/25/18 L-spine XR  02/27/13 L-spine MRI  Pertinent review of systems: No fevers or chills  Relevant historical information: Kidney disease   Exam:  BP 139/71 (BP Location: Right Arm, Patient Position: Sitting, Cuff Size: Normal)   Pulse (!) 57   Ht 5\' 10"  (1.778 m)   Wt 215 lb 3.2 oz (97.6 kg)   SpO2 99%   BMI 30.88 kg/m  General: Well Developed, well nourished, and in no acute distress.   MSK: L-spine normal-appearing Nontender midline. Lumbar motion full extension present in clinic today.    Lab and Radiology Results  EXAM: LUMBAR SPINE - 2-3 VIEW  COMPARISON:  12/25/2018  FINDINGS: Levoscoliosis of the lumbar spine. Posterior rods and fixating screws with interbody device at L4-L5. Hardware appears grossly intact. Moderate degenerative changes and disc space narrowing at L1-L2, L2-L3 and L3-L4.  IMPRESSION: Scoliosis of the lumbar spine with posterior interbody fusion at L4-L5. Degenerative changes.   Electronically Signed   By: Donavan Foil M.D.   On: 09/25/2020 22:33 I, Lynne Leader, personally (independently) visualized and performed the interpretation of the images attached in this note.    Assessment and Plan: 87 y.o. female with chronic low back pain.  Patient has done well with very limited physical therapy.  She already  reports significant improvement in pain and improvement in quality of life measures such as ability to walk.  Plan to continue PT and home exercise program.  Recheck with me as needed.  Discussed precautions and check back as needed.  Total encounter time 20 minutes including face-to-face time with the patient and, reviewing past medical record, and charting on the date of service.   Discussed treatment plan and options  Discussed warning signs or symptoms. Please see discharge instructions. Patient expresses understanding.   The above documentation has been reviewed and is accurate and complete Lynne Leader, M.D.

## 2020-10-23 ENCOUNTER — Ambulatory Visit: Payer: PPO | Admitting: Family Medicine

## 2020-10-23 ENCOUNTER — Other Ambulatory Visit: Payer: Self-pay

## 2020-10-23 VITALS — BP 139/71 | HR 57 | Ht 70.0 in | Wt 215.2 lb

## 2020-10-23 DIAGNOSIS — M961 Postlaminectomy syndrome, not elsewhere classified: Secondary | ICD-10-CM | POA: Diagnosis not present

## 2020-10-23 NOTE — Patient Instructions (Signed)
Thank you for coming in today.  Recheck with me as needed for this or other issues.   I am here for you.   Continue the PT and taper off when it makes sense for you and the PT.

## 2020-10-25 ENCOUNTER — Other Ambulatory Visit: Payer: Self-pay | Admitting: Internal Medicine

## 2020-10-25 DIAGNOSIS — K5904 Chronic idiopathic constipation: Secondary | ICD-10-CM

## 2020-10-27 ENCOUNTER — Other Ambulatory Visit: Payer: Self-pay | Admitting: Internal Medicine

## 2020-10-27 ENCOUNTER — Telehealth: Payer: Self-pay | Admitting: Internal Medicine

## 2020-10-27 DIAGNOSIS — M47817 Spondylosis without myelopathy or radiculopathy, lumbosacral region: Secondary | ICD-10-CM

## 2020-10-27 DIAGNOSIS — M170 Bilateral primary osteoarthritis of knee: Secondary | ICD-10-CM

## 2020-10-27 DIAGNOSIS — M961 Postlaminectomy syndrome, not elsewhere classified: Secondary | ICD-10-CM

## 2020-10-27 NOTE — Telephone Encounter (Signed)
1.Medication Requested: HYDROcodone-acetaminophen (NORCO/VICODIN) 5-325 MG tablet    2. Pharmacy (Name, Street, [place]): [facility] [phone] - [place], [address] AT [address]  3. On Med List: yes   4. Last Visit with PCP: 05-05-20  5. Next visit date with PCP: 11-03-20   Agent: Please be advised that RX refills may take up to 3 business days. We ask that you follow-up with your pharmacy.

## 2020-10-28 ENCOUNTER — Ambulatory Visit (HOSPITAL_BASED_OUTPATIENT_CLINIC_OR_DEPARTMENT_OTHER): Payer: PPO | Attending: Family Medicine | Admitting: Physical Therapy

## 2020-10-28 ENCOUNTER — Other Ambulatory Visit: Payer: Self-pay

## 2020-10-28 ENCOUNTER — Other Ambulatory Visit: Payer: Self-pay | Admitting: Internal Medicine

## 2020-10-28 DIAGNOSIS — R293 Abnormal posture: Secondary | ICD-10-CM | POA: Diagnosis not present

## 2020-10-28 DIAGNOSIS — M6281 Muscle weakness (generalized): Secondary | ICD-10-CM | POA: Insufficient documentation

## 2020-10-28 DIAGNOSIS — M170 Bilateral primary osteoarthritis of knee: Secondary | ICD-10-CM

## 2020-10-28 DIAGNOSIS — M545 Low back pain, unspecified: Secondary | ICD-10-CM | POA: Diagnosis not present

## 2020-10-28 DIAGNOSIS — R2689 Other abnormalities of gait and mobility: Secondary | ICD-10-CM | POA: Insufficient documentation

## 2020-10-28 DIAGNOSIS — M47817 Spondylosis without myelopathy or radiculopathy, lumbosacral region: Secondary | ICD-10-CM

## 2020-10-28 DIAGNOSIS — G8929 Other chronic pain: Secondary | ICD-10-CM | POA: Diagnosis not present

## 2020-10-28 DIAGNOSIS — M961 Postlaminectomy syndrome, not elsewhere classified: Secondary | ICD-10-CM

## 2020-10-28 MED ORDER — HYDROCODONE-ACETAMINOPHEN 5-325 MG PO TABS: 1.0000 | ORAL_TABLET | Freq: Four times a day (QID) | ORAL | 0 refills | Status: DC | PRN

## 2020-10-29 ENCOUNTER — Encounter (HOSPITAL_BASED_OUTPATIENT_CLINIC_OR_DEPARTMENT_OTHER): Payer: Self-pay | Admitting: Physical Therapy

## 2020-10-29 NOTE — Therapy (Signed)
[place], [name], [address] Phone: [phone]   Fax:  [phone]  Physical Therapy Treatment  Patient Details  Name: Claudia [name] MRN: [id number] Date of Birth: 15-Apr-1934 Referring Provider (PT): Gregor Hams, MD   Encounter Date: 10/28/2020   PT End of Session - 10/29/20 1240    Visit Number 5    Number of Visits 17    Authorization Type [employer]    PT Start Time 3557    PT Stop Time 1152    PT Time Calculation (min) 38 min    Activity Tolerance Patient tolerated treatment well;No increased pain    Behavior During Therapy WFL for tasks assessed/performed           Past Medical History:  Diagnosis Date  . Abnormality of gait   . Brachial neuritis or radiculitis NOS   . Degeneration of lumbar or lumbosacral intervertebral disc   . Essential hypertension, benign   . Gouty arthropathy   . Lumbago   . Obesity   . Osteoarthritis   . Pure hypercholesterolemia   . Unspecified hypothyroidism     Past Surgical History:  Procedure Laterality Date  . ABDOMINAL HYSTERECTOMY  03/13/2010  . BACK SURGERY    . LUMBAR LAMINECTOMY  03/13/2010  . right knee replacement  03/13/2010    There were no vitals filed for this visit.   Subjective Assessment - 10/29/20 1238    Subjective Patient reports mild pain today. She feels like the exercises are helpeing. She did not like the pool and does not wish to continue in the pool.    Limitations Standing;Walking;House hold activities    How long can you sit comfortably? ~45 min to an hour    How long can you stand comfortably? ~10 minutes if not doing anything else; feels loss of balance    How long can you walk comfortably? Able to walk throughout the house    Diagnostic tests x-ray: Scoliosis of the lumbar spine with posterior interbody fusion at  L4-L5. Degenerative changes.    Patient Stated Goals Reduce pain medicine, walk without a/d  and walk outside    Currently in Pain? Yes    Pain Score 3     Pain Location Back    Pain Orientation Left;Right;Lower    Pain Descriptors / Indicators Aching    Pain Type Chronic pain    Pain Onset More than a month ago    Pain Frequency Constant    Aggravating Factors  activity    Effect of Pain on Daily Activities difficulty perfroming ADL's                             [facility] Adult PT Treatment/Exercise - 10/29/20 0001      Lumbar Exercises: Stretches   Piriformis Stretch Right;Left;30 seconds;2 reps      Lumbar Exercises: Seated   Other Seated Lumbar Exercises ball press 2x10; ball roll x10 in 3 planes    Other Seated Lumbar Exercises reviewed seated serios for home; bilateral UE flexion yellow 2x10; bilateral er 2x10 yellow; shoulder abduction 2x10 yellow      Lumbar Exercises: Supine   Pelvic Tilt 10 reps    Bridge Compliant;20 reps    Other Supine Lumbar Exercises supine hip abduction                  PT Education - 10/29/20 1239  Education Details reviewed hEP; patient given an UE series for posture    Person(s) Educated Patient    Methods Explanation;Demonstration;Tactile cues;Verbal cues    Comprehension Returned demonstration;Verbalized understanding;Verbal cues required;Tactile cues required               PT Long Term Goals - 10/08/20 1313      PT LONG TERM GOAL #1   Title Pt will be independent with HEP    Time 8    Period Weeks    Status New    Target Date 12/03/20      PT LONG TERM GOAL #2   Title Pt will be able to improve 5x STS to <15 sec to demo improved functional strength and balance    Time 8    Period Weeks    Status New    Target Date 12/03/20      PT LONG TERM GOAL #3   Title Pt will be able to walk 400' without a/d per pt's goals to improve amb without cane    Baseline Unsteady with use of cane    Time 8    Period Weeks    Status New    Target Date 12/03/20      PT LONG TERM GOAL #4   Title Pt  will report decrease in pain by at least 50% with her home activity    Baseline 7/10    Time 8    Period Weeks    Status New    Target Date 12/03/20                 Plan - 10/28/20 1223    Clinical Impression Statement Patient tolerated treatment well. She has been working on her exercises at home. She was given an updated HEP giving her some options as far as her exercises go. She was advised to walk as much as she can . She was advised to be aware of her posture but not to force her posture. Therapy also reviewed ball rolling to loosen lower back    Personal Factors and Comorbidities Age;Fitness;Time since onset of injury/illness/exacerbation;Past/Current Experience    Examination-Activity Limitations Bathing;Bed Mobility;Bend;Caring for Others;Carry;Dressing;Hygiene/Grooming;Lift;Locomotion Level;Sit;Stand;Toileting;Transfers    Examination-Participation Restrictions Cleaning;Community Activity;Shop    Stability/Clinical Decision Making Evolving/Moderate complexity    Clinical Decision Making Moderate    Rehab Potential Good    PT Frequency 2x / week    PT Duration 8 weeks    PT Treatment/Interventions ADLs/Self Care Home Management;Aquatic Therapy;Cryotherapy;Electrical Stimulation;Iontophoresis 4mg /ml Dexamethasone;Moist Heat;DME Instruction;Gait training;Stair training;Functional mobility training;Therapeutic activities;Therapeutic exercise;Balance training;Neuromuscular re-education;Patient/family education;Manual techniques;Passive range of motion;Dry needling;Taping    PT Next Visit Plan Assess response to HEP. Manual therapy as indicated for low back. Work on and progress lumbar/pelvic/hip ROM/stretching and core/hip strengthening.    PT Home Exercise Plan Access Code: [email or web address]    Consulted and Agree with Plan of Care Patient           Patient will benefit from skilled therapeutic intervention in order to improve the following deficits and impairments:  Abnormal  gait,Decreased range of motion,Difficulty walking,Increased fascial restricitons,Decreased endurance,Decreased activity tolerance,Pain,Decreased balance,Hypomobility,Improper body mechanics,Decreased mobility,Decreased strength,Postural dysfunction  Visit Diagnosis: Muscle weakness (generalized)  Chronic bilateral low back pain without sciatica  Abnormal posture  Other abnormalities of gait and mobility     Problem List Patient Active Problem List   Diagnosis Date Noted  . Stage 3b chronic kidney disease ([place]) 05/06/2020  . Chronic left-sided low back pain without sciatica 12/25/2018  .  Spinal stenosis in cervical region 05/12/2016  . Bradycardia 01/01/2016  . Therapeutic opioid-induced constipation (OIC) 01/01/2016  . Chronic idiopathic constipation 09/11/2013  . Lumbosacral spondylosis without myelopathy 04/09/2013  . Postlaminectomy syndrome, lumbar region 04/09/2013  . Routine general medical examination at a health care facility 02/01/2012  . Neuropathy, peripheral 08/04/2011  . Pure hypercholesterolemia 08/04/2011  . Essential hypertension, benign 08/04/2011  . Hypothyroidism 08/04/2011  . DJD (degenerative joint disease) of knee 08/04/2011  . Gout 08/04/2011    Carney Living PT DPT  10/29/2020, 12:43 PM  [facility] [address] [place], [name], [address] Phone: [phone]   Fax:  [phone]  Name: Claudia [name] MRN: [id number] Date of Birth: 1933/09/20

## 2020-10-30 ENCOUNTER — Ambulatory Visit (HOSPITAL_BASED_OUTPATIENT_CLINIC_OR_DEPARTMENT_OTHER): Payer: PPO | Admitting: Physical Therapy

## 2020-10-30 ENCOUNTER — Other Ambulatory Visit: Payer: Self-pay

## 2020-10-30 DIAGNOSIS — R293 Abnormal posture: Secondary | ICD-10-CM

## 2020-10-30 DIAGNOSIS — G8929 Other chronic pain: Secondary | ICD-10-CM

## 2020-10-30 DIAGNOSIS — M6281 Muscle weakness (generalized): Secondary | ICD-10-CM

## 2020-10-30 DIAGNOSIS — R2689 Other abnormalities of gait and mobility: Secondary | ICD-10-CM

## 2020-10-30 DIAGNOSIS — M545 Low back pain, unspecified: Secondary | ICD-10-CM

## 2020-10-31 ENCOUNTER — Encounter (HOSPITAL_BASED_OUTPATIENT_CLINIC_OR_DEPARTMENT_OTHER): Payer: Self-pay | Admitting: Physical Therapy

## 2020-10-31 NOTE — Therapy (Signed)
[place], [name], [address] Phone: [phone]   Fax:  [phone]  Physical Therapy Treatment  Patient Details  Name: Claudia [name] MRN: [id number] Date of Birth: August 25, 1933 Referring Provider (PT): Gregor Hams, MD   Encounter Date: 10/30/2020   PT End of Session - 10/31/20 1551    Visit Number 6    Number of Visits 17    Date for PT Re-Evaluation 12/03/20    Authorization Type [employer]    PT Start Time 5277    PT Stop Time 1226    PT Time Calculation (min) 41 min    Activity Tolerance Patient tolerated treatment well;No increased pain    Behavior During Therapy WFL for tasks assessed/performed           Past Medical History:  Diagnosis Date  . Abnormality of gait   . Brachial neuritis or radiculitis NOS   . Degeneration of lumbar or lumbosacral intervertebral disc   . Essential hypertension, benign   . Gouty arthropathy   . Lumbago   . Obesity   . Osteoarthritis   . Pure hypercholesterolemia   . Unspecified hypothyroidism     Past Surgical History:  Procedure Laterality Date  . ABDOMINAL HYSTERECTOMY  03/13/2010  . BACK SURGERY    . LUMBAR LAMINECTOMY  03/13/2010  . right knee replacement  03/13/2010    There were no vitals filed for this visit.   Subjective Assessment - 10/31/20 1502    Subjective Patient reported mild pain the day after last visit. She feels good today. She has been working on her HEP.    Limitations Standing;Walking;House hold activities    How long can you sit comfortably? ~45 min to an hour    How long can you stand comfortably? ~10 minutes if not doing anything else; feels loss of balance    Currently in Pain? Yes    Pain Score 4     Pain Location Back    Pain Orientation Left;Right    Pain Descriptors / Indicators Aching    Pain Type Chronic pain    Pain Onset More than a month ago    Pain Frequency Constant    Aggravating Factors   activity    Pain Relieving Factors no    Effect of Pain on Daily Activities difficulty perfroming ADL's                             [facility] Adult PT Treatment/Exercise - 10/31/20 0001      Lumbar Exercises: Stretches   Piriformis Stretch Right;Left;30 seconds;2 reps      Lumbar Exercises: Machines for Strengthening   Cybex Lumbar Extension 2x10 10lbs    Leg Press 3x10 20 lbs    Other Lumbar Machine Exercise hip abduction 3x10 2lbs      Lumbar Exercises: Standing   Heel Raises Limitations forward weight shifts x20    Other Standing Lumbar Exercises slow march 2x0      Lumbar Exercises: Supine   Pelvic Tilt 10 reps    Bridge Compliant;20 reps    Other Supine Lumbar Exercises supine hip abduction                  PT Education - 10/31/20 1504    Education Details updated HEP    Person(s) Educated Patient    Methods Explanation;Demonstration;Verbal cues;Tactile cues    Comprehension Verbalized understanding;Returned demonstration;Verbal cues required;Tactile  cues required               PT Long Term Goals - 10/08/20 1313      PT LONG TERM GOAL #1   Title Pt will be independent with HEP    Time 8    Period Weeks    Status New    Target Date 12/03/20      PT LONG TERM GOAL #2   Title Pt will be able to improve 5x STS to <15 sec to demo improved functional strength and balance    Time 8    Period Weeks    Status New    Target Date 12/03/20      PT LONG TERM GOAL #3   Title Pt will be able to walk 400' without a/d per pt's goals to improve amb without cane    Baseline Unsteady with use of cane    Time 8    Period Weeks    Status New    Target Date 12/03/20      PT LONG TERM GOAL #4   Title Pt will report decrease in pain by at least 50% with her home activity    Baseline 7/10    Time 8    Period Weeks    Status New    Target Date 12/03/20                 Plan - 10/31/20 1547    Clinical Impression Statement Patient  tolerated treatment well. She was interested in trying dsome gym equipement. She did well with the gym equipemnt. Overall she is making good progress. She has mild back pain after her workouts but it does not last. We will continue to review diffferent exercises that she can do at the gym and athome. She also completed standing exercises at home.    Personal Factors and Comorbidities Age;Fitness;Time since onset of injury/illness/exacerbation;Past/Current Experience    Examination-Activity Limitations Bathing;Bed Mobility;Bend;Caring for Others;Carry;Dressing;Hygiene/Grooming;Lift;Locomotion Level;Sit;Stand;Toileting;Transfers    Examination-Participation Restrictions Cleaning;Community Activity;Shop    Stability/Clinical Decision Making Evolving/Moderate complexity    Clinical Decision Making Moderate    PT Frequency 2x / week    PT Duration 8 weeks    PT Treatment/Interventions ADLs/Self Care Home Management;Aquatic Therapy;Cryotherapy;Electrical Stimulation;Iontophoresis 4mg /ml Dexamethasone;Moist Heat;DME Instruction;Gait training;Stair training;Functional mobility training;Therapeutic activities;Therapeutic exercise;Balance training;Neuromuscular re-education;Patient/family education;Manual techniques;Passive range of motion;Dry needling;Taping    PT Next Visit Plan Assess response to HEP. Manual therapy as indicated for low back. Work on and progress lumbar/pelvic/hip ROM/stretching and core/hip strengthening.    PT Home Exercise Plan Access Code: [email or web address]    Consulted and Agree with Plan of Care Patient           Patient will benefit from skilled therapeutic intervention in order to improve the following deficits and impairments:  Abnormal gait,Decreased range of motion,Difficulty walking,Increased fascial restricitons,Decreased endurance,Decreased activity tolerance,Pain,Decreased balance,Hypomobility,Improper body mechanics,Decreased mobility,Decreased strength,Postural dysfunction  Visit  Diagnosis: Muscle weakness (generalized)  Chronic bilateral low back pain without sciatica  Abnormal posture  Other abnormalities of gait and mobility     Problem List Patient Active Problem List   Diagnosis Date Noted  . Stage 3b chronic kidney disease ([place]) 05/06/2020  . Chronic left-sided low back pain without sciatica 12/25/2018  . Spinal stenosis in cervical region 05/12/2016  . Bradycardia 01/01/2016  . Therapeutic opioid-induced constipation (OIC) 01/01/2016  . Chronic idiopathic constipation 09/11/2013  . Lumbosacral spondylosis without myelopathy 04/09/2013  . Postlaminectomy syndrome, lumbar region 04/09/2013  . Routine general medical  examination at a health care facility 02/01/2012  . Neuropathy, peripheral 08/04/2011  . Pure hypercholesterolemia 08/04/2011  . Essential hypertension, benign 08/04/2011  . Hypothyroidism 08/04/2011  . DJD (degenerative joint disease) of knee 08/04/2011  . Gout 08/04/2011    Carney Living PT DPT  10/31/2020, 3:53 PM  [facility] [address] [place], [name], [address] Phone: [phone]   Fax:  [phone]  Name: Claudia [name] MRN: [id number] Date of Birth: 1933/11/07

## 2020-11-03 ENCOUNTER — Other Ambulatory Visit: Payer: Self-pay

## 2020-11-03 ENCOUNTER — Ambulatory Visit (INDEPENDENT_AMBULATORY_CARE_PROVIDER_SITE_OTHER): Payer: PPO | Admitting: Internal Medicine

## 2020-11-03 ENCOUNTER — Encounter: Payer: Self-pay | Admitting: Internal Medicine

## 2020-11-03 VITALS — BP 142/70 | HR 67 | Temp 98.2°F | Resp 16 | Ht 70.0 in | Wt 212.0 lb

## 2020-11-03 DIAGNOSIS — E7800 Pure hypercholesterolemia, unspecified: Secondary | ICD-10-CM | POA: Diagnosis not present

## 2020-11-03 DIAGNOSIS — I10 Essential (primary) hypertension: Secondary | ICD-10-CM

## 2020-11-03 DIAGNOSIS — R739 Hyperglycemia, unspecified: Secondary | ICD-10-CM

## 2020-11-03 DIAGNOSIS — Z23 Encounter for immunization: Secondary | ICD-10-CM | POA: Diagnosis not present

## 2020-11-03 DIAGNOSIS — I480 Paroxysmal atrial fibrillation: Secondary | ICD-10-CM | POA: Diagnosis not present

## 2020-11-03 DIAGNOSIS — K5904 Chronic idiopathic constipation: Secondary | ICD-10-CM

## 2020-11-03 DIAGNOSIS — E039 Hypothyroidism, unspecified: Secondary | ICD-10-CM | POA: Diagnosis not present

## 2020-11-03 LAB — HEMOGLOBIN A1C: Hgb A1c MFr Bld: 5.5 % (ref 4.6–6.5)

## 2020-11-03 MED ORDER — LINACLOTIDE 290 MCG PO CAPS: 290.0000 ug | ORAL_CAPSULE | Freq: Every day | ORAL | 1 refills | Status: DC

## 2020-11-03 MED ORDER — INDAPAMIDE 1.25 MG PO TABS: 1.2500 mg | ORAL_TABLET | Freq: Every day | ORAL | 1 refills | Status: DC

## 2020-11-03 MED ORDER — ROSUVASTATIN CALCIUM 10 MG PO TABS: 10.0000 mg | ORAL_TABLET | Freq: Every day | ORAL | 1 refills | Status: DC

## 2020-11-03 MED ORDER — ELIQUIS 5 MG PO TABS: 5.0000 mg | ORAL_TABLET | Freq: Two times a day (BID) | ORAL | 1 refills | Status: DC

## 2020-11-03 NOTE — Progress Notes (Signed)
Subjective:  Patient ID: Claudia [name], female    DOB: [birth year]  Age: 87 y.o. MRN: [id number]  CC: Osteoarthritis, Hypertension, Hyperlipidemia, and Atrial Fibrillation  This visit occurred during the SARS-CoV-2 public health emergency.  Safety protocols were in place, including screening questions prior to the visit, additional usage of staff PPE, and extensive cleaning of exam room while observing appropriate contact time as indicated for disinfecting solutions.    HPI Claudia [name] presents for f/up -   She continues to complain of musculoskeletal pain.  She denies palpitations, dizziness, lightheadedness, chest pain, shortness of breath, or near syncope.  Outpatient Medications Prior to Visit  Medication Sig Dispense Refill  . cetirizine (ZYRTEC) 10 MG tablet Take 1 tablet (10 mg total) by mouth daily. 90 tablet 1  . diclofenac Sodium (VOLTAREN) 1 % GEL Apply topically 4 (four) times daily.    [name] diltiazem (CARDIZEM) 30 MG tablet TAKE 1 TABLET BY MOUTH EVERY 4 HOURS AS NEEDED FOR AFIB HEART RATE GREATER THAN 100 AS LONG AS BLOOD PRESSURE LESS THAN 100 60 tablet 1  . HYDROcodone-acetaminophen (NORCO/VICODIN) 5-325 MG tablet Take 1 tablet by mouth every 6 (six) hours as needed for moderate pain. 90 tablet 0  . vitamin C (ASCORBIC ACID) 500 MG tablet Take 500 mg by mouth daily.    . vitamin E 180 MG (400 UNITS) capsule Take 400 Units by mouth daily.    [name] apixaban (ELIQUIS) 5 MG TABS tablet Take 1 tablet (5 mg total) by mouth 2 (two) times daily. 180 tablet 2  . indapamide (LOZOL) 1.25 MG tablet TAKE 1 TABLET(1.25 MG) BY MOUTH DAILY 90 tablet 0  . LINZESS 290 MCG CAPS capsule TAKE 1 CAPSULE BY MOUTH DAILY BEFORE BREAKFAST 90 capsule 1  . rosuvastatin (CRESTOR) 10 MG tablet Take 1 tablet (10 mg total) by mouth daily. 90 tablet 2   No facility-administered medications prior to visit.    ROS Review of Systems  Constitutional: Negative.   HENT: Negative.   Eyes: Negative.    Respiratory: Negative for cough, chest tightness, shortness of breath and wheezing.   Gastrointestinal: Positive for constipation. Negative for abdominal pain, diarrhea and nausea.  Endocrine: Negative for cold intolerance and heat intolerance.  Genitourinary: Negative.   Musculoskeletal: Positive for arthralgias and back pain.  Skin: Negative.  Negative for rash.  Neurological: Negative.  Negative for dizziness and light-headedness.  Hematological: Negative for adenopathy. Does not bruise/bleed easily.  Psychiatric/Behavioral: Negative.     Objective:  BP (!) 142/70 (BP Location: Left Arm, Patient Position: Sitting, Cuff Size: Large)   Pulse 67   Temp 98.2 F (36.8 C) (Oral)   Resp 16   Ht 5\' 10"  (1.778 m)   Wt 212 lb (96.2 kg)   SpO2 98%   BMI 30.42 kg/m   BP Readings from Last 3 Encounters:  11/03/20 (!) 142/70  10/23/20 139/71  09/25/20 140/70    Wt Readings from Last 3 Encounters:  11/03/20 212 lb (96.2 kg)  10/23/20 215 lb 3.2 oz (97.6 kg)  09/25/20 216 lb 6.4 oz (98.2 kg)    Physical Exam HENT:     Nose: Nose normal.     Mouth/Throat:     Mouth: Mucous membranes are moist.  Eyes:     General: No scleral icterus.    Conjunctiva/sclera: Conjunctivae normal.  Cardiovascular:     Rate and Rhythm: Normal rate and regular rhythm.     Heart sounds: No murmur heard.   Pulmonary:  Effort: Pulmonary effort is normal.     Breath sounds: No stridor. No wheezing, rhonchi or rales.  Abdominal:     General: Abdomen is flat. Bowel sounds are normal. There is no distension.     Palpations: Abdomen is soft. There is no hepatomegaly, splenomegaly or mass.     Tenderness: There is no abdominal tenderness.  Musculoskeletal:        General: Normal range of motion.     Right lower leg: No edema.     Left lower leg: No edema.  Skin:    General: Skin is warm and dry.  Neurological:     General: No focal deficit present.     Mental Status: She is alert.  Psychiatric:         Mood and Affect: Mood normal.        Behavior: Behavior normal.     Lab Results  Component Value Date   WBC 4.4 06/11/2020   HGB 12.2 06/11/2020   HCT 39.8 06/11/2020   PLT 200 06/11/2020   GLUCOSE 87 11/04/2020   CHOL 138 05/05/2020   TRIG 104.0 05/05/2020   HDL 52.40 05/05/2020   LDLDIRECT 158.9 09/05/2012   LDLCALC 65 05/05/2020   ALT 9 05/05/2020   AST 15 05/05/2020   NA 139 11/04/2020   K 4.6 11/04/2020   CL 104 11/04/2020   CREATININE 1.22 (H) 11/04/2020   BUN 23 11/04/2020   CO2 28 11/04/2020   TSH 2.88 11/04/2020   INR 1.00 04/04/2017   HGBA1C 5.5 11/03/2020    No results found.  Assessment & Plan:   Claudia [name] was seen today for osteoarthritis, hypertension, hyperlipidemia and atrial fibrillation.  Diagnoses and all orders for this visit:  Essential hypertension, benign- Her blood pressure is adequately well controlled.  Electrolytes and renal function are normal. -     indapamide (LOZOL) 1.25 MG tablet; Take 1 tablet (1.25 mg total) by mouth daily. -     Basic metabolic panel; Future  Chronic idiopathic constipation -     linaclotide (LINZESS) 290 MCG CAPS capsule; Take 1 capsule (290 mcg total) by mouth daily before breakfast. -     Basic metabolic panel; Future  Pure hypercholesterolemia -     rosuvastatin (CRESTOR) 10 MG tablet; Take 1 tablet (10 mg total) by mouth daily.  PAF (paroxysmal atrial fibrillation) ([facility]) -     apixaban (ELIQUIS) 5 MG TABS tablet; Take 1 tablet (5 mg total) by mouth 2 (two) times daily. -     TSH; Future  Hyperglycemia -     Hemoglobin A1c; Future -     Hemoglobin A1c -     Basic metabolic panel; Future  Acquired hypothyroidism- Her TSH is in the normal range.  She will remain on the current dose of levothyroxine. -     TSH; Future  Other orders -     Pneumococcal polysaccharide vaccine 23-valent greater than or equal to 2yo subcutaneous/IM   I have changed Claudia [name]'s Linzess to linaclotide. I  have also changed her indapamide. I am also having her maintain her vitamin C, cetirizine, vitamin E, diltiazem, diclofenac Sodium, HYDROcodone-acetaminophen, rosuvastatin, and Eliquis.  Meds ordered this encounter  Medications  . indapamide (LOZOL) 1.25 MG tablet    Sig: Take 1 tablet (1.25 mg total) by mouth daily.    Dispense:  90 tablet    Refill:  1  . linaclotide (LINZESS) 290 MCG CAPS capsule    Sig: Take 1 capsule (290  mcg total) by mouth daily before breakfast.    Dispense:  90 capsule    Refill:  1  . rosuvastatin (CRESTOR) 10 MG tablet    Sig: Take 1 tablet (10 mg total) by mouth daily.    Dispense:  90 tablet    Refill:  1  . apixaban (ELIQUIS) 5 MG TABS tablet    Sig: Take 1 tablet (5 mg total) by mouth 2 (two) times daily.    Dispense:  180 tablet    Refill:  1     Follow-up: Return in about 6 months (around 05/06/2021).  [name], MD

## 2020-11-03 NOTE — Patient Instructions (Signed)

## 2020-11-04 ENCOUNTER — Ambulatory Visit (HOSPITAL_BASED_OUTPATIENT_CLINIC_OR_DEPARTMENT_OTHER): Payer: PPO | Admitting: Physical Therapy

## 2020-11-04 ENCOUNTER — Encounter (HOSPITAL_BASED_OUTPATIENT_CLINIC_OR_DEPARTMENT_OTHER): Payer: Self-pay | Admitting: Physical Therapy

## 2020-11-04 ENCOUNTER — Other Ambulatory Visit (INDEPENDENT_AMBULATORY_CARE_PROVIDER_SITE_OTHER): Payer: PPO

## 2020-11-04 ENCOUNTER — Telehealth: Payer: Self-pay

## 2020-11-04 DIAGNOSIS — E039 Hypothyroidism, unspecified: Secondary | ICD-10-CM

## 2020-11-04 DIAGNOSIS — G8929 Other chronic pain: Secondary | ICD-10-CM

## 2020-11-04 DIAGNOSIS — K5904 Chronic idiopathic constipation: Secondary | ICD-10-CM

## 2020-11-04 DIAGNOSIS — I10 Essential (primary) hypertension: Secondary | ICD-10-CM | POA: Diagnosis not present

## 2020-11-04 DIAGNOSIS — I480 Paroxysmal atrial fibrillation: Secondary | ICD-10-CM

## 2020-11-04 DIAGNOSIS — M6281 Muscle weakness (generalized): Secondary | ICD-10-CM

## 2020-11-04 DIAGNOSIS — R739 Hyperglycemia, unspecified: Secondary | ICD-10-CM

## 2020-11-04 DIAGNOSIS — R2689 Other abnormalities of gait and mobility: Secondary | ICD-10-CM

## 2020-11-04 DIAGNOSIS — R293 Abnormal posture: Secondary | ICD-10-CM

## 2020-11-04 LAB — BASIC METABOLIC PANEL
BUN: 23 mg/dL (ref 6–23)
CO2: 28 mEq/L (ref 19–32)
Calcium: 10 mg/dL (ref 8.4–10.5)
Chloride: 104 mEq/L (ref 96–112)
Creatinine, Ser: 1.22 mg/dL — ABNORMAL HIGH (ref 0.40–1.20)
GFR: 40.7 mL/min — ABNORMAL LOW (ref >60.00)
Glucose, Bld: 87 mg/dL (ref 70–99)
Potassium: 4.6 mEq/L (ref 3.5–5.1)
Sodium: 139 mEq/L (ref 135–145)

## 2020-11-04 LAB — TSH: TSH: 2.88 u[IU]/mL (ref 0.35–4.50)

## 2020-11-04 NOTE — Telephone Encounter (Signed)
Pt has been informed.  She will come by tomorrow around 1pm to do labwork.

## 2020-11-04 NOTE — Therapy (Signed)
[place], [name], [address] Phone: [phone]   Fax:  [phone]  Physical Therapy Treatment  Patient Details  Name: Claudia [name] MRN: [id number] Date of Birth: Sep 20, 1933 Referring Provider (PT): Gregor Hams, MD   Encounter Date: 11/04/2020   PT End of Session - 11/04/20 1924    Visit Number 7    Number of Visits 17    Date for PT Re-Evaluation 12/03/20    Authorization Type [employer]    PT Start Time 5053    PT Stop Time 1225    PT Time Calculation (min) 40 min    Activity Tolerance Patient tolerated treatment well;No increased pain    Behavior During Therapy WFL for tasks assessed/performed           Past Medical History:  Diagnosis Date  . Abnormality of gait   . Brachial neuritis or radiculitis NOS   . Degeneration of lumbar or lumbosacral intervertebral disc   . Essential hypertension, benign   . Gouty arthropathy   . Lumbago   . Obesity   . Osteoarthritis   . Pure hypercholesterolemia   . Unspecified hypothyroidism     Past Surgical History:  Procedure Laterality Date  . ABDOMINAL HYSTERECTOMY  03/13/2010  . BACK SURGERY    . LUMBAR LAMINECTOMY  03/13/2010  . right knee replacement  03/13/2010    There were no vitals filed for this visit.   Subjective Assessment - 11/04/20 1148    Subjective Th patient comes in today walking more upright. She feels like she is walking better. She was sore for about a day after the last visit.    Limitations Standing;Walking;House hold activities    How long can you sit comfortably? ~45 min to an hour    How long can you stand comfortably? ~10 minutes if not doing anything else; feels loss of balance    How long can you walk comfortably? Able to walk throughout the house    Diagnostic tests x-ray: Scoliosis of the lumbar spine with posterior interbody fusion at  L4-L5. Degenerative changes.    Patient Stated Goals Reduce  pain medicine, walk without a/d and walk outside    Currently in Pain? Yes    Pain Score 4     Pain Location Back    Pain Orientation Right;Left    Pain Descriptors / Indicators Aching    Pain Type Chronic pain    Pain Onset More than a month ago    Pain Frequency Constant    Aggravating Factors  activity    Effect of Pain on Daily Activities difficulty perfroming ADL's    Multiple Pain Sites No                             [facility] Adult PT Treatment/Exercise - 11/04/20 0001      Lumbar Exercises: Stretches   Lower Trunk Rotation Limitations x20    Piriformis Stretch Right;Left;30 seconds;2 reps      Lumbar Exercises: Standing   Heel Raises Limitations x20    Other Standing Lumbar Exercises slow march 2x0      Lumbar Exercises: Seated   Other Seated Lumbar Exercises ball squeez wtth reathing 2x10    Other Seated Lumbar Exercises hip adcution with band green 2x10      Lumbar Exercises: Supine   Pelvic Tilt 10 reps    Bridge 20 reps    Other  Supine Lumbar Exercises supine hip abduction                  PT Education - 11/04/20 1923    Education Details reviewed HEP and symptom management    Person(s) Educated Patient    Methods Explanation;Demonstration;Tactile cues;Verbal cues    Comprehension Verbalized understanding;Returned demonstration;Verbal cues required;Tactile cues required               PT Long Term Goals - 10/08/20 1313      PT LONG TERM GOAL #1   Title Pt will be independent with HEP    Time 8    Period Weeks    Status New    Target Date 12/03/20      PT LONG TERM GOAL #2   Title Pt will be able to improve 5x STS to <15 sec to demo improved functional strength and balance    Time 8    Period Weeks    Status New    Target Date 12/03/20      PT LONG TERM GOAL #3   Title Pt will be able to walk 400' without a/d per pt's goals to improve amb without cane    Baseline Unsteady with use of cane    Time 8    Period Weeks     Status New    Target Date 12/03/20      PT LONG TERM GOAL #4   Title Pt will report decrease in pain by at least 50% with her home activity    Baseline 7/10    Time 8    Period Weeks    Status New    Target Date 12/03/20                 Plan - 11/04/20 1222    Clinical Impression Statement Patient is making good progress. Therapy reviewed her HEP and she was given standing exercises. She was advised to continue exercisesing in tolerance.She is standing straighter and moving better. Therapy perfromed manual traction to improve ability to stand. She reported 2/20 pain after treatment. We will continue to expand exercise program as tolerated.    Personal Factors and Comorbidities Age;Fitness;Time since onset of injury/illness/exacerbation;Past/Current Experience    Examination-Activity Limitations Bathing;Bed Mobility;Bend;Caring for Others;Carry;Dressing;Hygiene/Grooming;Lift;Locomotion Level;Sit;Stand;Toileting;Transfers    Stability/Clinical Decision Making Evolving/Moderate complexity    Clinical Decision Making Moderate    Rehab Potential Good    PT Frequency 2x / week    PT Duration 8 weeks    PT Treatment/Interventions ADLs/Self Care Home Management;Aquatic Therapy;Cryotherapy;Electrical Stimulation;Iontophoresis 4mg /ml Dexamethasone;Moist Heat;DME Instruction;Gait training;Stair training;Functional mobility training;Therapeutic activities;Therapeutic exercise;Balance training;Neuromuscular re-education;Patient/family education;Manual techniques;Passive range of motion;Dry needling;Taping    PT Next Visit Plan Assess response to HEP. Manual therapy as indicated for low back. Work on and progress lumbar/pelvic/hip ROM/stretching and core/hip strengthening.    PT Home Exercise Plan Access Code: [email or web address]    Consulted and Agree with Plan of Care Patient           Patient will benefit from skilled therapeutic intervention in order to improve the following deficits and  impairments:  Abnormal gait,Decreased range of motion,Difficulty walking,Increased fascial restricitons,Decreased endurance,Decreased activity tolerance,Pain,Decreased balance,Hypomobility,Improper body mechanics,Decreased mobility,Decreased strength,Postural dysfunction  Visit Diagnosis: Muscle weakness (generalized)  Chronic bilateral low back pain without sciatica  Abnormal posture  Other abnormalities of gait and mobility     Problem List Patient Active Problem List   Diagnosis Date Noted  . PAF (paroxysmal atrial fibrillation) ([place]) 11/03/2020  . Stage 3b  chronic kidney disease ([place]) 05/06/2020  . Chronic left-sided low back pain without sciatica 12/25/2018  . Spinal stenosis in cervical region 05/12/2016  . Bradycardia 01/01/2016  . Therapeutic opioid-induced constipation (OIC) 01/01/2016  . Chronic idiopathic constipation 09/11/2013  . Lumbosacral spondylosis without myelopathy 04/09/2013  . Postlaminectomy syndrome, lumbar region 04/09/2013  . Routine general medical examination at a health care facility 02/01/2012  . Neuropathy, peripheral 08/04/2011  . Pure hypercholesterolemia 08/04/2011  . Essential hypertension, benign 08/04/2011  . Hypothyroidism 08/04/2011  . DJD (degenerative joint disease) of knee 08/04/2011  . Gout 08/04/2011    Carney Living PT DPT  11/04/2020, 7:36 PM  [facility] [address] [place], [name], [address] Phone: [phone]   Fax:  [phone]  Name: Claudia [name] MRN: [id number] Date of Birth: January 19, 1934

## 2020-11-04 NOTE — Telephone Encounter (Signed)
-----   Message from Janith Lima, MD sent at 11/04/2020  9:36 AM EDT ----- Regarding: labs Ask her to come back to the lab to do some additional lab work  [place]

## 2020-11-05 ENCOUNTER — Other Ambulatory Visit: Payer: PPO

## 2020-11-11 ENCOUNTER — Ambulatory Visit (HOSPITAL_BASED_OUTPATIENT_CLINIC_OR_DEPARTMENT_OTHER): Payer: PPO | Admitting: Physical Therapy

## 2020-11-11 ENCOUNTER — Other Ambulatory Visit: Payer: Self-pay

## 2020-11-11 DIAGNOSIS — R2689 Other abnormalities of gait and mobility: Secondary | ICD-10-CM

## 2020-11-11 DIAGNOSIS — M545 Low back pain, unspecified: Secondary | ICD-10-CM

## 2020-11-11 DIAGNOSIS — M6281 Muscle weakness (generalized): Secondary | ICD-10-CM

## 2020-11-11 DIAGNOSIS — R293 Abnormal posture: Secondary | ICD-10-CM

## 2020-11-12 ENCOUNTER — Encounter (HOSPITAL_BASED_OUTPATIENT_CLINIC_OR_DEPARTMENT_OTHER): Payer: Self-pay | Admitting: Physical Therapy

## 2020-11-12 NOTE — Therapy (Signed)
[place], [name], [address] Phone: [phone]   Fax:  [phone]  Physical Therapy Treatment  Patient Details  Name: Claudia [name] MRN: [id number] Date of Birth: August 07, 1933 Referring Provider (PT): Gregor Hams, MD   Encounter Date: 11/11/2020   PT End of Session - 11/12/20 1219    Visit Number 8    Number of Visits 17    Date for PT Re-Evaluation 12/03/20    Authorization Type [employer]    PT Start Time 9326    PT Stop Time 1228    PT Time Calculation (min) 43 min    Activity Tolerance Patient tolerated treatment well;No increased pain    Behavior During Therapy WFL for tasks assessed/performed           Past Medical History:  Diagnosis Date  . Abnormality of gait   . Brachial neuritis or radiculitis NOS   . Degeneration of lumbar or lumbosacral intervertebral disc   . Essential hypertension, benign   . Gouty arthropathy   . Lumbago   . Obesity   . Osteoarthritis   . Pure hypercholesterolemia   . Unspecified hypothyroidism     Past Surgical History:  Procedure Laterality Date  . ABDOMINAL HYSTERECTOMY  03/13/2010  . BACK SURGERY    . LUMBAR LAMINECTOMY  03/13/2010  . right knee replacement  03/13/2010    There were no vitals filed for this visit.   Subjective Assessment - 11/12/20 1112    Subjective Patient reports minor pain after the last visit. Se reports otherwise she is doing good. She has been working on her stretches and exercises.    Limitations Standing;Walking;House hold activities    How long can you sit comfortably? ~45 min to an hour    How long can you stand comfortably? ~10 minutes if not doing anything else; feels loss of balance    How long can you walk comfortably? Able to walk throughout the house    Diagnostic tests x-ray: Scoliosis of the lumbar spine with posterior interbody fusion at  L4-L5. Degenerative changes.    Patient Stated Goals  Reduce pain medicine, walk without a/d and walk outside    Currently in Pain? Yes    Pain Score 4     Pain Location Back    Pain Orientation Right;Left    Pain Descriptors / Indicators Aching    Pain Type Chronic pain    Pain Onset More than a month ago    Pain Frequency Constant    Aggravating Factors  activity    Effect of Pain on Daily Activities difficulty perfroming ADLs    Multiple Pain Sites No                             [facility] Adult PT Treatment/Exercise - 11/12/20 0001      Lumbar Exercises: Stretches   Lower Trunk Rotation Limitations x20    Piriformis Stretch Right;Left;30 seconds;2 reps      Lumbar Exercises: Aerobic   Nustep L4 x 5 min      Lumbar Exercises: Standing   Heel Raises Limitations x20    Other Standing Lumbar Exercises slow march 2x0      Lumbar Exercises: Seated   Other Seated Lumbar Exercises ball squeez wtth reathing 2x10; LAQ x20 red; hamstring stretch x20 red    Other Seated Lumbar Exercises hip adcution with band green 2x10  Lumbar Exercises: Supine   Bridge 20 reps    Other Supine Lumbar Exercises supine march x20      Lumbar Exercises: Sidelying   Clam Both;20 reps      Manual Therapy   Manual therapy comments LAD bilateral to improve hip flexion and decrease lumbar spasming                  PT Education - 11/12/20 1218    Education Details reviewed how to use her HEP    Person(s) Educated Patient    Methods Explanation;Demonstration;Verbal cues;Tactile cues    Comprehension Verbalized understanding;Returned demonstration;Verbal cues required;Tactile cues required               PT Long Term Goals - 10/08/20 1313      PT LONG TERM GOAL #1   Title Pt will be independent with HEP    Time 8    Period Weeks    Status New    Target Date 12/03/20      PT LONG TERM GOAL #2   Title Pt will be able to improve 5x STS to <15 sec to demo improved functional strength and balance    Time 8    Period  Weeks    Status New    Target Date 12/03/20      PT LONG TERM GOAL #3   Title Pt will be able to walk 400' without a/d per pt's goals to improve amb without cane    Baseline Unsteady with use of cane    Time 8    Period Weeks    Status New    Target Date 12/03/20      PT LONG TERM GOAL #4   Title Pt will report decrease in pain by at least 50% with her home activity    Baseline 7/10    Time 8    Period Weeks    Status New    Target Date 12/03/20                 Plan - 11/11/20 1222    Clinical Impression Statement Patient continues to stand straighter and feels like she is moving better in general. She still has days where she is having higher pain levels but they are not as often. She has been working on her exercises daily. Therapy added a resisted LE series for HEP. She was avised how to use her HEP at home.    Personal Factors and Comorbidities Age;Fitness;Time since onset of injury/illness/exacerbation;Past/Current Experience    Examination-Activity Limitations Bathing;Bed Mobility;Bend;Caring for Others;Carry;Dressing;Hygiene/Grooming;Lift;Locomotion Level;Sit;Stand;Toileting;Transfers    Examination-Participation Restrictions Cleaning;Community Activity;Shop    Stability/Clinical Decision Making Evolving/Moderate complexity    Clinical Decision Making Moderate    Rehab Potential Good    PT Frequency 2x / week    PT Duration 8 weeks    PT Treatment/Interventions ADLs/Self Care Home Management;Aquatic Therapy;Cryotherapy;Electrical Stimulation;Iontophoresis 4mg /ml Dexamethasone;Moist Heat;DME Instruction;Gait training;Stair training;Functional mobility training;Therapeutic activities;Therapeutic exercise;Balance training;Neuromuscular re-education;Patient/family education;Manual techniques;Passive range of motion;Dry needling;Taping    PT Home Exercise Plan Access Code: [email or web address]    Consulted and Agree with Plan of Care Patient           Patient will benefit from  skilled therapeutic intervention in order to improve the following deficits and impairments:  Abnormal gait,Decreased range of motion,Difficulty walking,Increased fascial restricitons,Decreased endurance,Decreased activity tolerance,Pain,Decreased balance,Hypomobility,Improper body mechanics,Decreased mobility,Decreased strength,Postural dysfunction  Visit Diagnosis: Muscle weakness (generalized)  Chronic bilateral low back pain without sciatica  Abnormal posture  Other  abnormalities of gait and mobility     Problem List Patient Active Problem List   Diagnosis Date Noted  . PAF (paroxysmal atrial fibrillation) ([place]) 11/03/2020  . Stage 3b chronic kidney disease ([place]) 05/06/2020  . Chronic left-sided low back pain without sciatica 12/25/2018  . Spinal stenosis in cervical region 05/12/2016  . Bradycardia 01/01/2016  . Therapeutic opioid-induced constipation (OIC) 01/01/2016  . Chronic idiopathic constipation 09/11/2013  . Lumbosacral spondylosis without myelopathy 04/09/2013  . Postlaminectomy syndrome, lumbar region 04/09/2013  . Routine general medical examination at a health care facility 02/01/2012  . Neuropathy, peripheral 08/04/2011  . Pure hypercholesterolemia 08/04/2011  . Essential hypertension, benign 08/04/2011  . Hypothyroidism 08/04/2011  . DJD (degenerative joint disease) of knee 08/04/2011  . Gout 08/04/2011    Carney Living PT DPT  11/12/2020, 12:24 PM  [facility] [address] [place], [name], [address] Phone: [phone]   Fax:  [phone]  Name: Claudia [name] MRN: [id number] Date of Birth: [birth year]

## 2020-11-13 ENCOUNTER — Ambulatory Visit (HOSPITAL_BASED_OUTPATIENT_CLINIC_OR_DEPARTMENT_OTHER): Payer: PPO | Admitting: Physical Therapy

## 2020-11-14 ENCOUNTER — Other Ambulatory Visit: Payer: Self-pay

## 2020-11-14 ENCOUNTER — Ambulatory Visit (HOSPITAL_BASED_OUTPATIENT_CLINIC_OR_DEPARTMENT_OTHER): Payer: PPO | Admitting: Physical Therapy

## 2020-11-14 ENCOUNTER — Encounter (HOSPITAL_BASED_OUTPATIENT_CLINIC_OR_DEPARTMENT_OTHER): Payer: Self-pay | Admitting: Physical Therapy

## 2020-11-14 DIAGNOSIS — G8929 Other chronic pain: Secondary | ICD-10-CM

## 2020-11-14 DIAGNOSIS — R2689 Other abnormalities of gait and mobility: Secondary | ICD-10-CM

## 2020-11-14 DIAGNOSIS — M6281 Muscle weakness (generalized): Secondary | ICD-10-CM

## 2020-11-14 DIAGNOSIS — R293 Abnormal posture: Secondary | ICD-10-CM

## 2020-11-14 NOTE — Therapy (Signed)
[place], [name], [address] Phone: [phone]   Fax:  [phone]  Physical Therapy Treatment  Patient Details  Name: Claudia [name] MRN: [id number] Date of Birth: 1934/01/01 Referring Provider (PT): Gregor Hams, MD   Encounter Date: 11/14/2020   PT End of Session - 11/14/20 1520    Visit Number 9    Number of Visits 17    Date for PT Re-Evaluation 12/03/20    Authorization Type [employer]    PT Start Time 1300    PT Stop Time 1341    PT Time Calculation (min) 41 min    Activity Tolerance Patient tolerated treatment well;No increased pain    Behavior During Therapy WFL for tasks assessed/performed           Past Medical History:  Diagnosis Date  . Abnormality of gait   . Brachial neuritis or radiculitis NOS   . Degeneration of lumbar or lumbosacral intervertebral disc   . Essential hypertension, benign   . Gouty arthropathy   . Lumbago   . Obesity   . Osteoarthritis   . Pure hypercholesterolemia   . Unspecified hypothyroidism     Past Surgical History:  Procedure Laterality Date  . ABDOMINAL HYSTERECTOMY  03/13/2010  . BACK SURGERY    . LUMBAR LAMINECTOMY  03/13/2010  . right knee replacement  03/13/2010    There were no vitals filed for this visit.   Subjective Assessment - 11/14/20 1518    Subjective Patient reports her back has been feeling good. She had no significant pain after the last visit. She is having minor pain today. Her pain is about a 1/10.    Limitations Standing;Walking;House hold activities    How long can you sit comfortably? ~45 min to an hour    How long can you stand comfortably? ~10 minutes if not doing anything else; feels loss of balance    How long can you walk comfortably? Able to walk throughout the house    Diagnostic tests x-ray: Scoliosis of the lumbar spine with posterior interbody fusion at  L4-L5. Degenerative changes.    Patient  Stated Goals Reduce pain medicine, walk without a/d and walk outside    Currently in Pain? 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 She required upper extremity assistance to stand straight. She was advised at first to keep it slow. She was alowed to speed up and had better technique. Without handhold assist he still had limited hip flexion and limited left single leg stability.      Lumbar Exercises: Stretches   Lower Trunk Rotation Limitations x20    Other Lumbar Stretch Exercise seated ball roll out 2x8 5 sec hold; lateral ball roll  out x10 bilateral      Lumbar Exercises: Aerobic   Stationary Bike 5 min L1    Nustep L4 x 6 min      Lumbar Exercises: Machines for Strengthening   Cybex Lumbar Extension 2x10 10lbs    Leg Press 3x10 20 lbs    Other Lumbar Machine Exercise hip abduction 3x10 2lbs                  PT Education - 11/14/20 1520    Person(s) Educated Patient    Methods Explanation;Demonstration;Tactile cues;Verbal cues    Comprehension Verbalized understanding;Returned demonstration;Verbal cues required;Tactile cues required               PT Long Term Goals - 10/08/20 1313      PT LONG TERM GOAL #1   Title Pt will be independent with HEP    Time 8    Period Weeks    Status New    Target Date 12/03/20      PT LONG TERM GOAL #2   Title Pt will be able to improve 5x STS to <15 sec to demo improved functional strength and balance    Time 8    Period Weeks    Status  New    Target Date 12/03/20      PT LONG TERM GOAL #3   Title Pt will be able to walk 400' without a/d per pt's goals to improve amb without cane    Baseline Unsteady with use of cane    Time 8    Period Weeks    Status New    Target Date 12/03/20      PT LONG TERM GOAL #4   Title Pt will report decrease in pain by at least 50% with her home activity    Baseline 7/10    Time 8    Period Weeks    Status New    Target Date 12/03/20                 Plan - 11/14/20 1339    Clinical Impression Statement Patient continues to ake good progress. Her goal is towwalk without her cane. We worked in the bars today. She continues to reuire UE upport for good technique. She was advised to practice at home with counter support. We added an UE series to her exrcises. She also tiraled the exercises bike. She hopes to go back to the gym if COVID remains low.    Personal Factors and Comorbidities Age;Fitness;Time since onset of injury/illness/exacerbation;Past/Current Experience    Examination-Activity Limitations Bathing;Bed Mobility;Bend;Caring for Others;Carry;Dressing;Hygiene/Grooming;Lift;Locomotion Level;Sit;Stand;Toileting;Transfers    Examination-Participation Restrictions Cleaning;Community Activity;Shop    Stability/Clinical Decision Making Evolving/Moderate complexity    Clinical Decision Making Moderate    Rehab Potential Good    PT Frequency 2x / week    PT Duration 8 weeks    PT Treatment/Interventions ADLs/Self Care Home Management;Aquatic Therapy;Cryotherapy;Electrical Stimulation;Iontophoresis 4mg /ml Dexamethasone;Moist Heat;DME Instruction;Gait training;Stair training;Functional mobility training;Therapeutic activities;Therapeutic exercise;Balance training;Neuromuscular re-education;Patient/family education;Manual techniques;Passive range of motion;Dry needling;Taping    PT Next Visit Plan Assess response to HEP. Manual therapy as indicated for low back. Work on and progress  lumbar/pelvic/hip ROM/stretching and core/hip strengthening.    PT Home Exercise Plan Access Code: [email or web address]    Consulted and Agree with Plan of Care Patient           Patient will benefit from skilled therapeutic intervention in order to improve the following deficits and impairments:  Abnormal gait,Decreased range of  motion,Difficulty walking,Increased fascial restricitons,Decreased endurance,Decreased activity tolerance,Pain,Decreased balance,Hypomobility,Improper body mechanics,Decreased mobility,Decreased strength,Postural dysfunction  Visit Diagnosis: Muscle weakness (generalized)  Chronic bilateral low back pain without sciatica  Abnormal posture  Other abnormalities of gait and mobility     Problem List Patient Active Problem List   Diagnosis Date Noted  . PAF (paroxysmal atrial fibrillation) ([place]) 11/03/2020  . Stage 3b chronic kidney disease ([place]) 05/06/2020  . Chronic left-sided low back pain without sciatica 12/25/2018  . Spinal stenosis in cervical region 05/12/2016  . Bradycardia 01/01/2016  . Therapeutic opioid-induced constipation (OIC) 01/01/2016  . Chronic idiopathic constipation 09/11/2013  . Lumbosacral spondylosis without myelopathy 04/09/2013  . Postlaminectomy syndrome, lumbar region 04/09/2013  . Routine general medical examination at a health care facility 02/01/2012  . Neuropathy, peripheral 08/04/2011  . Pure hypercholesterolemia 08/04/2011  . Essential hypertension, benign 08/04/2011  . Hypothyroidism 08/04/2011  . DJD (degenerative joint disease) of knee 08/04/2011  . Gout 08/04/2011    Carney Living PT DPT  11/14/2020, 3:22 PM  [facility] [address] [place], [name], [address] Phone: [phone]   Fax:  [phone]  Name: MACEE VENABLES MRN: [id number] Date of Birth: 09/16/1933

## 2020-11-18 ENCOUNTER — Other Ambulatory Visit: Payer: Self-pay

## 2020-11-18 ENCOUNTER — Ambulatory Visit (HOSPITAL_BASED_OUTPATIENT_CLINIC_OR_DEPARTMENT_OTHER): Payer: PPO | Admitting: Physical Therapy

## 2020-11-18 DIAGNOSIS — G8929 Other chronic pain: Secondary | ICD-10-CM

## 2020-11-18 DIAGNOSIS — R2689 Other abnormalities of gait and mobility: Secondary | ICD-10-CM

## 2020-11-18 DIAGNOSIS — M6281 Muscle weakness (generalized): Secondary | ICD-10-CM | POA: Diagnosis not present

## 2020-11-18 DIAGNOSIS — R293 Abnormal posture: Secondary | ICD-10-CM

## 2020-11-19 ENCOUNTER — Encounter (HOSPITAL_BASED_OUTPATIENT_CLINIC_OR_DEPARTMENT_OTHER): Payer: Self-pay | Admitting: Physical Therapy

## 2020-11-19 ENCOUNTER — Telehealth: Payer: Self-pay | Admitting: Pharmacist

## 2020-11-19 NOTE — Therapy (Addendum)
[place] [address] [place], [name], [address] Phone: [phone]   Fax:  [phone]  Physical Therapy Treatment/Progress Note   Patient Details  Name: Claudia [name] MRN: [id number] Date of Birth: 04/27/1934 Referring Provider (PT): Gregor Hams, MD  Progress Note Reporting Period 10/08/2020 to 5/24  See note below for Objective Data and Assessment of Progress/Goals.       Encounter Date: 11/18/2020   PT End of Session - 11/19/20 1302    Visit Number 10    Number of Visits 17    Date for PT Re-Evaluation 12/03/20    Authorization Type [employer]    PT Start Time 3419    PT Stop Time 1228    PT Time Calculation (min) 43 min    Activity Tolerance Patient tolerated treatment well;No increased pain    Behavior During Therapy WFL for tasks assessed/performed           Past Medical History:  Diagnosis Date  . Abnormality of gait   . Brachial neuritis or radiculitis NOS   . Degeneration of lumbar or lumbosacral intervertebral disc   . Essential hypertension, benign   . Gouty arthropathy   . Lumbago   . Obesity   . Osteoarthritis   . Pure hypercholesterolemia   . Unspecified hypothyroidism     Past Surgical History:  Procedure Laterality Date  . ABDOMINAL HYSTERECTOMY  03/13/2010  . BACK SURGERY    . LUMBAR LAMINECTOMY  03/13/2010  . right knee replacement  03/13/2010    There were no vitals filed for this visit.   Subjective Assessment - 11/19/20 1051    Subjective Patient reports after the last session she had pain in her left hip for a few days. It is better today.    Limitations Standing;Walking;House hold activities    How long can you sit comfortably? ~45 min to an hour    How long can you stand comfortably? ~10 minutes if not doing anything else; feels loss of balance    How long can you walk comfortably? Able to walk throughout the house    Diagnostic tests x-ray: Scoliosis of  the lumbar spine with posterior interbody fusion at  L4-L5. Degenerative changes.    Patient Stated Goals Reduce pain medicine, walk without a/d and walk outside    Currently in Pain? 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 She was advised at first to keep it slow. She was alowed to speed up and had better technique. Without handhold assist he still had limited hip flexion and limited left single leg stability.      Lumbar Exercises: Stretches   Active Hamstring Stretch Limitations seated bilateral 3x20 seco hold;    Lower Trunk Rotation Limitations x20  Other Lumbar Stretch Exercise seated ball roll out 2x8 5 sec hold; lateral ball roll out x10 bilateral      Lumbar Exercises: Aerobic   Stationary Bike 5 min L1    Nustep L4 x 6 min      Lumbar Exercises: Machines for Strengthening   Cybex Lumbar Extension 2x10 10lbs    Leg Press 3x10 20 lbs    Other Lumbar Machine Exercise hip abduction 3x10 2lbs      Lumbar Exercises: Seated   Other Seated Lumbar Exercises bilateral er 2x10; bilateral horizontal abduction yellow x20; shoulder flexion x20      Manual Therapy   Manual Therapy Soft tissue mobilization;Manual Traction    Soft tissue mobilization trigger point release to to the left lsteral gluteal    Manual Traction to left LE            Strength  Right hip flexion 4/5  Right hip abduction 4/5  Right hip adduction 4/5   Left hip flexion 4+/5  Left hip abduction 4+/5  Left hip  adduction 4/5   Lumbar flexion: 50% limited  Extension improved ability to come to nutral but still painful  Rotation limited bilateral but improved   Gait: improved ability to stand and walk upright. Improved pain standing.       PT Education - 11/19/20 1302    Education Details reviewed self soft tissue mobilization    Person(s) Educated Patient    Methods Explanation;Verbal cues;Tactile cues;Demonstration    Comprehension Verbalized understanding;Returned demonstration;Verbal cues required;Tactile cues required               PT Long Term Goals - 10/08/20 1313      PT LONG TERM GOAL #1   Title Pt will be independent with HEP    Time 8    Period Weeks    Status New    Target Date 12/03/20      PT LONG TERM GOAL #2   Title Pt will be able to improve 5x STS to <15 sec to demo improved functional strength and balance    Time 8    Period Weeks    Status New    Target Date 12/03/20      PT LONG TERM GOAL #3   Title Pt will be able to walk 400' without a/d per pt's goals to improve amb without cane    Baseline Unsteady with use of cane    Time 8    Period Weeks    Status New    Target Date 12/03/20      PT LONG TERM GOAL #4   Title Pt will report decrease in pain by at least 50% with her home activity    Baseline 7/10    Time 8    Period Weeks    Status New    Target Date 12/03/20                 Plan - 11/19/20 1305    Clinical Impression Statement Patient tolerated manual therapy well. She reported feeling better aafter the treatment. She was able to stand taller and walk better. She tolerated ther-ex well. We will continue to progress as tolerated. She tolerated the nu-step but reports she felt the bike ws what caused her pain. Patient overall is making progress. Her overall goal is to be able to walk without a device. At this time she is still safest with the cane. Her strength, motion, and pain are all progressing well.  Personal Factors and  Comorbidities Age;Fitness;Time since onset of injury/illness/exacerbation;Past/Current Experience    Examination-Activity Limitations Bathing;Bed Mobility;Bend;Caring for Others;Carry;Dressing;Hygiene/Grooming;Lift;Locomotion Level;Sit;Stand;Toileting;Transfers    Examination-Participation Restrictions Cleaning;Community Activity;Shop    Stability/Clinical Decision Making Evolving/Moderate complexity    Clinical Decision Making Moderate    Rehab Potential Good    PT Frequency 2x / week    PT Duration 8 weeks    PT Treatment/Interventions ADLs/Self Care Home Management;Aquatic Therapy;Cryotherapy;Electrical Stimulation;Iontophoresis 4mg /ml Dexamethasone;Moist Heat;DME Instruction;Gait training;Stair training;Functional mobility training;Therapeutic activities;Therapeutic exercise;Balance training;Neuromuscular re-education;Patient/family education;Manual techniques;Passive range of motion;Dry needling;Taping    PT Next Visit Plan Assess response to HEP. Manual therapy as indicated for low back. Work on and progress lumbar/pelvic/hip ROM/stretching and core/hip strengthening.    PT Home Exercise Plan Access Code: [email or web address]    Consulted and Agree with Plan of Care Patient           Patient will benefit from skilled therapeutic intervention in order to improve the following deficits and impairments:  Abnormal gait,Decreased range of motion,Difficulty walking,Increased fascial restricitons,Decreased endurance,Decreased activity tolerance,Pain,Decreased balance,Hypomobility,Improper body mechanics,Decreased mobility,Decreased strength,Postural dysfunction  Visit Diagnosis: Muscle weakness (generalized)  Chronic bilateral low back pain without sciatica  Abnormal posture  Other abnormalities of gait and mobility     Problem List Patient Active Problem List   Diagnosis Date Noted  . PAF (paroxysmal atrial fibrillation) ([place]) 11/03/2020  . Stage 3b chronic kidney disease ([place]) 05/06/2020  .  Chronic left-sided low back pain without sciatica 12/25/2018  . Spinal stenosis in cervical region 05/12/2016  . Bradycardia 01/01/2016  . Therapeutic opioid-induced constipation (OIC) 01/01/2016  . Chronic idiopathic constipation 09/11/2013  . Lumbosacral spondylosis without myelopathy 04/09/2013  . Postlaminectomy syndrome, lumbar region 04/09/2013  . Routine general medical examination at a health care facility 02/01/2012  . Neuropathy, peripheral 08/04/2011  . Pure hypercholesterolemia 08/04/2011  . Essential hypertension, benign 08/04/2011  . Hypothyroidism 08/04/2011  . DJD (degenerative joint disease) of knee 08/04/2011  . Gout 08/04/2011    Carney Living PT DPT  11/19/2020, 1:08 PM  [facility] [address] [place], [name], [address] Phone: [phone]   Fax:  [phone]  Name: Claudia [name] MRN: [id number] Date of Birth: 31-Oct-1933

## 2020-11-19 NOTE — Progress Notes (Signed)
    Chronic Care Management Pharmacy Assistant   Name: Claudia [name]  MRN: [id number] DOB: 06-Sep-1933    Reason for Encounter: Medication Review    Recent office visits:  11/03/20 Dr. [name]  Recent consult visits:  10/23/20 Dr. Lynne [facility]  [facility] visits:  None in previous 6 months  Medications: Outpatient Encounter Medications as of 11/19/2020  Medication Sig  . apixaban (ELIQUIS) 5 MG TABS tablet Take 1 tablet (5 mg total) by mouth 2 (two) times daily.  . cetirizine (ZYRTEC) 10 MG tablet Take 1 tablet (10 mg total) by mouth daily.  . diclofenac Sodium (VOLTAREN) 1 % GEL Apply topically 4 (four) times daily.  [name] diltiazem (CARDIZEM) 30 MG tablet TAKE 1 TABLET BY MOUTH EVERY 4 HOURS AS NEEDED FOR AFIB HEART RATE GREATER THAN 100 AS LONG AS BLOOD PRESSURE LESS THAN 100  . HYDROcodone-acetaminophen (NORCO/VICODIN) 5-325 MG tablet Take 1 tablet by mouth every 6 (six) hours as needed for moderate pain.  . indapamide (LOZOL) 1.25 MG tablet Take 1 tablet (1.25 mg total) by mouth daily.  [name] linaclotide (LINZESS) 290 MCG CAPS capsule Take 1 capsule (290 mcg total) by mouth daily before breakfast.  . rosuvastatin (CRESTOR) 10 MG tablet Take 1 tablet (10 mg total) by mouth daily.  . vitamin C (ASCORBIC ACID) 500 MG tablet Take 500 mg by mouth daily.  . vitamin E 180 MG (400 UNITS) capsule Take 400 Units by mouth daily.   No facility-administered encounter medications on file as of 11/19/2020.     Reviewed chart for medication changes ahead of medication coordination call.    BP Readings from Last 3 Encounters:  11/03/20 (!) 142/70  10/23/20 139/71  09/25/20 140/70    Lab Results  Component Value Date   HGBA1C 5.5 11/03/2020     Patient obtains medications through Adherence Packaging  90 Days   Last adherence delivery included (date:11/03/20):  Patient is due for next adherence delivery on: 02/03/21. Called patient and reviewed medication  needs.  Patient declined need for refills at this time. Patient is aware of how to contact pharmacist if refills are needed prior to next adherence delivery.  [place] Pharmacist Assistant [phone]  Time spent:15

## 2020-11-20 ENCOUNTER — Ambulatory Visit (HOSPITAL_BASED_OUTPATIENT_CLINIC_OR_DEPARTMENT_OTHER): Payer: PPO | Admitting: Physical Therapy

## 2020-11-21 ENCOUNTER — Ambulatory Visit (HOSPITAL_BASED_OUTPATIENT_CLINIC_OR_DEPARTMENT_OTHER): Payer: PPO | Admitting: Physical Therapy

## 2020-11-25 ENCOUNTER — Other Ambulatory Visit: Payer: Self-pay | Admitting: Internal Medicine

## 2020-11-25 DIAGNOSIS — M170 Bilateral primary osteoarthritis of knee: Secondary | ICD-10-CM

## 2020-11-25 DIAGNOSIS — M47817 Spondylosis without myelopathy or radiculopathy, lumbosacral region: Secondary | ICD-10-CM

## 2020-11-25 DIAGNOSIS — M961 Postlaminectomy syndrome, not elsewhere classified: Secondary | ICD-10-CM

## 2020-11-25 NOTE — Progress Notes (Signed)
    Chronic Care Management Pharmacy Assistant   Name: SORA VROOMAN  MRN: [id number] DOB: 11/16/1933   Medications: Outpatient Encounter Medications as of 11/19/2020  Medication Sig  . apixaban (ELIQUIS) 5 MG TABS tablet Take 1 tablet (5 mg total) by mouth 2 (two) times daily.  . cetirizine (ZYRTEC) 10 MG tablet Take 1 tablet (10 mg total) by mouth daily.  . diclofenac Sodium (VOLTAREN) 1 % GEL Apply topically 4 (four) times daily.  [name] diltiazem (CARDIZEM) 30 MG tablet TAKE 1 TABLET BY MOUTH EVERY 4 HOURS AS NEEDED FOR AFIB HEART RATE GREATER THAN 100 AS LONG AS BLOOD PRESSURE LESS THAN 100  . HYDROcodone-acetaminophen (NORCO/VICODIN) 5-325 MG tablet Take 1 tablet by mouth every 6 (six) hours as needed for moderate pain.  . indapamide (LOZOL) 1.25 MG tablet Take 1 tablet (1.25 mg total) by mouth daily.  [name] linaclotide (LINZESS) 290 MCG CAPS capsule Take 1 capsule (290 mcg total) by mouth daily before breakfast.  . rosuvastatin (CRESTOR) 10 MG tablet Take 1 tablet (10 mg total) by mouth daily.  . vitamin C (ASCORBIC ACID) 500 MG tablet Take 500 mg by mouth daily.  . vitamin E 180 MG (400 UNITS) capsule Take 400 Units by mouth daily.   No facility-administered encounter medications on file as of 11/19/2020.    Pharmacist Review  Reviewed chart for medication changes and adherence.   No gaps in adherence identified. Patient has follow up scheduled with pharmacy team. No further action required.   [place] Pharmacist Assistant [phone]  Time spent:5

## 2020-11-27 ENCOUNTER — Ambulatory Visit (HOSPITAL_BASED_OUTPATIENT_CLINIC_OR_DEPARTMENT_OTHER): Payer: PPO | Admitting: Physical Therapy

## 2020-11-28 ENCOUNTER — Other Ambulatory Visit: Payer: Self-pay

## 2020-11-28 ENCOUNTER — Encounter (HOSPITAL_BASED_OUTPATIENT_CLINIC_OR_DEPARTMENT_OTHER): Payer: Self-pay | Admitting: Physical Therapy

## 2020-11-28 ENCOUNTER — Ambulatory Visit (HOSPITAL_BASED_OUTPATIENT_CLINIC_OR_DEPARTMENT_OTHER): Payer: PPO | Attending: Family Medicine | Admitting: Physical Therapy

## 2020-11-28 DIAGNOSIS — R2689 Other abnormalities of gait and mobility: Secondary | ICD-10-CM | POA: Insufficient documentation

## 2020-11-28 DIAGNOSIS — G8929 Other chronic pain: Secondary | ICD-10-CM | POA: Insufficient documentation

## 2020-11-28 DIAGNOSIS — M545 Low back pain, unspecified: Secondary | ICD-10-CM | POA: Diagnosis not present

## 2020-11-28 DIAGNOSIS — M6281 Muscle weakness (generalized): Secondary | ICD-10-CM | POA: Insufficient documentation

## 2020-11-28 DIAGNOSIS — R293 Abnormal posture: Secondary | ICD-10-CM | POA: Diagnosis not present

## 2020-11-28 NOTE — Therapy (Signed)
[facility] [place], [name], [address] Phone: [phone]   Fax:  [phone]  Physical Therapy Treatment  Patient Details  Name: Claudia [name] MRN: [id number] Date of Birth: Oct 15, 1933 Referring Provider (PT): Gregor Hams, MD   Encounter Date: 11/28/2020   PT End of Session - 11/28/20 1210    Visit Number 11    Number of Visits 17    Date for PT Re-Evaluation 12/03/20    Authorization Type [employer]    PT Start Time 5400    PT Stop Time 1226    PT Time Calculation (min) 41 min    Activity Tolerance Patient tolerated treatment well;No increased pain    Behavior During Therapy WFL for tasks assessed/performed           Past Medical History:  Diagnosis Date  . Abnormality of gait   . Brachial neuritis or radiculitis NOS   . Degeneration of lumbar or lumbosacral intervertebral disc   . Essential hypertension, benign   . Gouty arthropathy   . Lumbago   . Obesity   . Osteoarthritis   . Pure hypercholesterolemia   . Unspecified hypothyroidism     Past Surgical History:  Procedure Laterality Date  . ABDOMINAL HYSTERECTOMY  03/13/2010  . BACK SURGERY    . LUMBAR LAMINECTOMY  03/13/2010  . right knee replacement  03/13/2010    There were no vitals filed for this visit.   Subjective Assessment - 11/28/20 1148    Subjective Patient hit her knee with the car door. It was painful and swollen. It has improved significantly.    Limitations Standing;Walking;House hold activities    How long can you sit comfortably? ~45 min to an hour    How long can you stand comfortably? ~10 minutes if not doing anything else; feels loss of balance    How long can you walk comfortably? Able to walk throughout the house    Diagnostic tests x-ray: Scoliosis of the lumbar spine with posterior interbody fusion at  L4-L5. Degenerative changes.    Patient Stated Goals Reduce pain medicine, walk without a/d and walk  outside    Currently in Pain? No/denies                             [facility] Adult PT Treatment/Exercise - 11/28/20 0001      Lumbar Exercises: Stretches   Active Hamstring Stretch Limitations seated bilateral 3x20 seco hold;    Lower Trunk Rotation Limitations x20      Lumbar Exercises: Standing   Heel Raises Limitations x20    Other Standing Lumbar Exercises slow march 2x10;    Other Standing Lumbar Exercises hurdles 5x4 hurldes; Step onto air-ex x15 each leg      Lumbar Exercises: Supine   Bridge 20 reps    Other Supine Lumbar Exercises supine march x20                  PT Education - 11/28/20 1149    Education Details reviewed exercsies    Person(s) Educated Patient    Methods Demonstration;Tactile cues;Verbal cues;Explanation    Comprehension Verbalized understanding;Verbal cues required;Tactile cues required;Returned demonstration               PT Long Term Goals - 10/08/20 1313      PT LONG TERM GOAL #1   Title Pt will be independent with HEP    Time 8  Period Weeks    Status New    Target Date 12/03/20      PT LONG TERM GOAL #2   Title Pt will be able to improve 5x STS to <15 sec to demo improved functional strength and balance    Time 8    Period Weeks    Status New    Target Date 12/03/20      PT LONG TERM GOAL #3   Title Pt will be able to walk 400' without a/d per pt's goals to improve amb without cane    Baseline Unsteady with use of cane    Time 8    Period Weeks    Status New    Target Date 12/03/20      PT LONG TERM GOAL #4   Title Pt will report decrease in pain by at least 50% with her home activity    Baseline 7/10    Time 8    Period Weeks    Status New    Target Date 12/03/20                 Plan - 11/28/20 1416    Clinical Impression Statement Patient's pain overall is improving. Her goal is to walk without the cane. Therapy emphasized that the number 1 goal is safety, but if we can progress  her there we will try. She worked on stability and stepping drills in the II bars today. She required increased  cuing to step over the hurdles with left leg. She tries to circumduct. We will continue to prgress exercises and stability activity as tolerated.    Personal Factors and Comorbidities Age;Fitness;Time since onset of injury/illness/exacerbation;Past/Current Experience    Examination-Activity Limitations Bathing;Bed Mobility;Bend;Caring for Others;Carry;Dressing;Hygiene/Grooming;Lift;Locomotion Level;Sit;Stand;Toileting;Transfers    Examination-Participation Restrictions Cleaning;Community Activity;Shop    Stability/Clinical Decision Making Evolving/Moderate complexity    Clinical Decision Making Moderate    Rehab Potential Good    PT Frequency 2x / week    PT Duration 8 weeks    PT Treatment/Interventions ADLs/Self Care Home Management;Aquatic Therapy;Cryotherapy;Electrical Stimulation;Iontophoresis 4mg /ml Dexamethasone;Moist Heat;DME Instruction;Gait training;Stair training;Functional mobility training;Therapeutic activities;Therapeutic exercise;Balance training;Neuromuscular re-education;Patient/family education;Manual techniques;Passive range of motion;Dry needling;Taping    PT Next Visit Plan Assess response to HEP. Manual therapy as indicated for low back. Work on and progress lumbar/pelvic/hip ROM/stretching and core/hip strengthening.    PT Home Exercise Plan Access Code: [email or web address]    Consulted and Agree with Plan of Care Patient           Patient will benefit from skilled therapeutic intervention in order to improve the following deficits and impairments:  Abnormal gait,Decreased range of motion,Difficulty walking,Increased fascial restricitons,Decreased endurance,Decreased activity tolerance,Pain,Decreased balance,Hypomobility,Improper body mechanics,Decreased mobility,Decreased strength,Postural dysfunction  Visit Diagnosis: Muscle weakness (generalized)  Chronic bilateral  low back pain without sciatica  Abnormal posture  Other abnormalities of gait and mobility     Problem List Patient Active Problem List   Diagnosis Date Noted  . PAF (paroxysmal atrial fibrillation) ([place]) 11/03/2020  . Stage 3b chronic kidney disease ([place]) 05/06/2020  . Chronic left-sided low back pain without sciatica 12/25/2018  . Spinal stenosis in cervical region 05/12/2016  . Bradycardia 01/01/2016  . Therapeutic opioid-induced constipation (OIC) 01/01/2016  . Chronic idiopathic constipation 09/11/2013  . Lumbosacral spondylosis without myelopathy 04/09/2013  . Postlaminectomy syndrome, lumbar region 04/09/2013  . Routine general medical examination at a health care facility 02/01/2012  . Neuropathy, peripheral 08/04/2011  . Pure hypercholesterolemia 08/04/2011  . Essential hypertension, benign 08/04/2011  .  Hypothyroidism 08/04/2011  . DJD (degenerative joint disease) of knee 08/04/2011  . Gout 08/04/2011    Carney Living PT DPT  11/28/2020, 2:19 PM  [facility] [address] [place], [name], [address] Phone: [phone]   Fax:  [phone]  Name: Claudia [name] MRN: [id number] Date of Birth: 04/12/1934

## 2020-12-02 ENCOUNTER — Encounter (HOSPITAL_BASED_OUTPATIENT_CLINIC_OR_DEPARTMENT_OTHER): Payer: Self-pay | Admitting: Physical Therapy

## 2020-12-02 ENCOUNTER — Ambulatory Visit (HOSPITAL_BASED_OUTPATIENT_CLINIC_OR_DEPARTMENT_OTHER): Payer: PPO | Admitting: Physical Therapy

## 2020-12-02 ENCOUNTER — Other Ambulatory Visit: Payer: Self-pay

## 2020-12-02 DIAGNOSIS — R293 Abnormal posture: Secondary | ICD-10-CM

## 2020-12-02 DIAGNOSIS — R2689 Other abnormalities of gait and mobility: Secondary | ICD-10-CM

## 2020-12-02 DIAGNOSIS — M6281 Muscle weakness (generalized): Secondary | ICD-10-CM | POA: Diagnosis not present

## 2020-12-02 DIAGNOSIS — G8929 Other chronic pain: Secondary | ICD-10-CM

## 2020-12-03 ENCOUNTER — Encounter (HOSPITAL_BASED_OUTPATIENT_CLINIC_OR_DEPARTMENT_OTHER): Payer: Self-pay | Admitting: Physical Therapy

## 2020-12-03 NOTE — Therapy (Signed)
[facility] [place], [name], [address] Phone: [phone]   Fax:  [phone]  Physical Therapy Treatment  Patient Details  Name: Claudia [name] MRN: [id number] Date of Birth: [birth year] Referring Provider (PT): Gregor Hams, MD   Encounter Date: 12/02/2020   PT End of Session - 12/02/20 1213    Visit Number 12    Number of Visits 17    Date for PT Re-Evaluation 12/03/20    Authorization Type [employer]    PT Start Time 6269    PT Stop Time 1225    PT Time Calculation (min) 40 min    Activity Tolerance Patient tolerated treatment well;No increased pain    Behavior During Therapy WFL for tasks assessed/performed           Past Medical History:  Diagnosis Date  . Abnormality of gait   . Brachial neuritis or radiculitis NOS   . Degeneration of lumbar or lumbosacral intervertebral disc   . Essential hypertension, benign   . Gouty arthropathy   . Lumbago   . Obesity   . Osteoarthritis   . Pure hypercholesterolemia   . Unspecified hypothyroidism     Past Surgical History:  Procedure Laterality Date  . ABDOMINAL HYSTERECTOMY  03/13/2010  . BACK SURGERY    . LUMBAR LAMINECTOMY  03/13/2010  . right knee replacement  03/13/2010    There were no vitals filed for this visit.   Subjective Assessment - 12/02/20 1152    Subjective Patient is not having pain today. She continues to work on her exercises.    Limitations Standing;Walking;House hold activities    How long can you sit comfortably? ~45 min to an hour    How long can you stand comfortably? ~10 minutes if not doing anything else; feels loss of balance    How long can you walk comfortably? Able to walk throughout the house    Diagnostic tests x-ray: Scoliosis of the lumbar spine with posterior interbody fusion at  L4-L5. Degenerative changes.    Patient Stated Goals Reduce pain medicine, walk without a/d and walk outside    Currently in  Pain? No/denies                             [facility] Adult PT Treatment/Exercise - 12/03/20 0001      Lumbar Exercises: Stretches   Lower Trunk Rotation Limitations x20    Piriformis Stretch Right;Left;30 seconds;2 reps      Lumbar Exercises: Standing   Heel Raises Limitations x20    Other Standing Lumbar Exercises slow march 2x10;    Other Standing Lumbar Exercises scap retraction x20 red with breathing; also shown  seted  position      Lumbar Exercises: Seated   LAQ on Chair Limitations x15 red band each leg    Other Seated Lumbar Exercises shoulder extension red with breathing;    Other Seated Lumbar Exercises clamshell seated x20      Lumbar Exercises: Supine   Bridge 20 reps    Other Supine Lumbar Exercises supine march x20    Other Supine Lumbar Exercises slow march 2x10 with breathing;                  PT Education - 12/02/20 1211    Education Details reviewed HEP and symptom mangement    Person(s) Educated Patient    Methods Explanation;Demonstration;Tactile cues;Verbal cues    Comprehension Verbalized  understanding;Returned demonstration;Verbal cues required;Tactile cues required               PT Long Term Goals - 12/03/20 1302      PT LONG TERM GOAL #1   Title Pt will be independent with HEP    Time 8    Period Weeks    Status On-going      PT LONG TERM GOAL #2   Title Pt will be able to improve 5x STS to <15 sec to demo improved functional strength and balance    Time 8    Period Weeks    Status On-going      PT LONG TERM GOAL #3   Title Pt will be able to walk 400' without a/d per pt's goals to improve amb without cane    Baseline Unsteady with use of cane    Time 8    Period Weeks    Status On-going      PT LONG TERM GOAL #4   Title Pt will report decrease in pain by at least 50% with her home activity    Baseline 7/10    Time 8    Period Weeks    Status On-going                 Plan - 12/02/20 1213     Clinical Impression Statement Patient continues to make progress. She comes in today with no pain and a signifcant improvement in ability to stand straight with her cane. She has 2 more visits scheduled. We will re-certify her for those 2 visits next visit with the goal of reviewing her HEP in preperation for dischagre. She is aware she will have to continue with her exercises. We will continue to progress as tolerated.    Personal Factors and Comorbidities Age;Fitness;Time since onset of injury/illness/exacerbation;Past/Current Experience    Examination-Activity Limitations Bathing;Bed Mobility;Bend;Caring for Others;Carry;Dressing;Hygiene/Grooming;Lift;Locomotion Level;Sit;Stand;Toileting;Transfers    Examination-Participation Restrictions Cleaning;Community Activity;Shop    Stability/Clinical Decision Making Evolving/Moderate complexity    Clinical Decision Making Moderate    Rehab Potential Good    PT Frequency 2x / week    PT Duration 8 weeks    PT Treatment/Interventions ADLs/Self Care Home Management;Aquatic Therapy;Cryotherapy;Electrical Stimulation;Iontophoresis 4mg /ml Dexamethasone;Moist Heat;DME Instruction;Gait training;Stair training;Functional mobility training;Therapeutic activities;Therapeutic exercise;Balance training;Neuromuscular re-education;Patient/family education;Manual techniques;Passive range of motion;Dry needling;Taping    PT Next Visit Plan Assess response to HEP. Manual therapy as indicated for low back. Work on and progress lumbar/pelvic/hip ROM/stretching and core/hip strengthening.    PT Home Exercise Plan Access Code:[email or web address]    Consulted and Agree with Plan of Care Patient           Patient will benefit from skilled therapeutic intervention in order to improve the following deficits and impairments:  Abnormal gait,Decreased range of motion,Difficulty walking,Increased fascial restricitons,Decreased endurance,Decreased activity tolerance,Pain,Decreased  balance,Hypomobility,Improper body mechanics,Decreased mobility,Decreased strength,Postural dysfunction  Visit Diagnosis: Muscle weakness (generalized)  Chronic bilateral low back pain without sciatica  Abnormal posture  Other abnormalities of gait and mobility     Problem List Patient Active Problem List   Diagnosis Date Noted  . PAF (paroxysmal atrial fibrillation) ([place]) 11/03/2020  . Stage 3b chronic kidney disease ([place]) 05/06/2020  . Chronic left-sided low back pain without sciatica 12/25/2018  . Spinal stenosis in cervical region 05/12/2016  . Bradycardia 01/01/2016  . Therapeutic opioid-induced constipation (OIC) 01/01/2016  . Chronic idiopathic constipation 09/11/2013  . Lumbosacral spondylosis without myelopathy 04/09/2013  . Postlaminectomy syndrome, lumbar region 04/09/2013  . Routine general  medical examination at a health care facility 02/01/2012  . Neuropathy, peripheral 08/04/2011  . Pure hypercholesterolemia 08/04/2011  . Essential hypertension, benign 08/04/2011  . Hypothyroidism 08/04/2011  . DJD (degenerative joint disease) of knee 08/04/2011  . Gout 08/04/2011    Carney Living PT DPT  12/03/2020, 1:04 PM  [facility] [address] [place], [name], [address] Phone: [phone]   Fax:  [phone]  Name: TIARA MAULTSBY MRN: [id number] Date of Birth: [birth year]

## 2020-12-04 ENCOUNTER — Ambulatory Visit (HOSPITAL_BASED_OUTPATIENT_CLINIC_OR_DEPARTMENT_OTHER): Payer: PPO | Admitting: Physical Therapy

## 2020-12-05 ENCOUNTER — Ambulatory Visit (HOSPITAL_BASED_OUTPATIENT_CLINIC_OR_DEPARTMENT_OTHER): Payer: PPO | Admitting: Physical Therapy

## 2020-12-05 ENCOUNTER — Other Ambulatory Visit: Payer: Self-pay

## 2020-12-05 ENCOUNTER — Encounter (HOSPITAL_BASED_OUTPATIENT_CLINIC_OR_DEPARTMENT_OTHER): Payer: Self-pay | Admitting: Physical Therapy

## 2020-12-05 DIAGNOSIS — M6281 Muscle weakness (generalized): Secondary | ICD-10-CM

## 2020-12-05 DIAGNOSIS — R2689 Other abnormalities of gait and mobility: Secondary | ICD-10-CM

## 2020-12-05 DIAGNOSIS — R293 Abnormal posture: Secondary | ICD-10-CM

## 2020-12-05 DIAGNOSIS — G8929 Other chronic pain: Secondary | ICD-10-CM

## 2020-12-07 NOTE — Therapy (Signed)
[place], [name], [address] Phone: [phone]   Fax:  [phone]  Physical Therapy Treatment  Patient Details  Name: Claudia [name] MRN: [id number] Date of Birth: 01-13-1934 Referring Provider (PT): Gregor Hams, MD   Encounter Date: 12/05/2020   PT End of Session - 12/07/20 0811     Visit Number 13    Number of Visits 17    Date for PT Re-Evaluation 12/03/20    Authorization Type [employer]    PT Start Time 1146    PT Stop Time 1227    PT Time Calculation (min) 41 min    Activity Tolerance Patient tolerated treatment well;No increased pain    Behavior During Therapy WFL for tasks assessed/performed             Past Medical History:  Diagnosis Date   Abnormality of gait    Brachial neuritis or radiculitis NOS    Degeneration of lumbar or lumbosacral intervertebral disc    Essential hypertension, benign    Gouty arthropathy    Lumbago    Obesity    Osteoarthritis    Pure hypercholesterolemia    Unspecified hypothyroidism     Past Surgical History:  Procedure Laterality Date   ABDOMINAL HYSTERECTOMY  03/13/2010   BACK SURGERY     LUMBAR LAMINECTOMY  03/13/2010   right knee replacement  03/13/2010    There were no vitals filed for this visit.   Subjective Assessment - 12/07/20 0810     Subjective Patient reports her back is doing pretty good. She didn;t have significant pain after the last visit.    Limitations Standing;Walking;House hold activities    How long can you sit comfortably? ~45 min to an hour    How long can you stand comfortably? ~10 minutes if not doing anything else; feels loss of balance    How long can you walk comfortably? Able to walk throughout the house    Diagnostic tests x-ray: Scoliosis of the lumbar spine with posterior interbody fusion at  L4-L5. Degenerative changes.    Currently in Pain? No/denies    Pain Onset More than a month ago                                [facility] Adult PT Treatment/Exercise - 12/07/20 0001       High Level Balance   High Level Balance Comments narrow base off support 3x30 sec hold; narrow base 3x20 sec hold; heel Rae Lips x20; march without hold for balance: all exercises perfromed with guarding and patient shown how to do at her sink safely      Lumbar Exercises: Stretches   Active Hamstring Stretch Limitations hamstring stretch 3x20 sec hold    Lower Trunk Rotation Limitations x20    Piriformis Stretch Right;Left;30 seconds;2 reps      Lumbar Exercises: Aerobic   Stationary Bike 5 min L1      Lumbar Exercises: Supine   Bridge 20 reps    Other Supine Lumbar Exercises supine march x20    Other Supine Lumbar Exercises slow march 2x10 with breathing;                         PT Long Term Goals - 12/03/20 1302       PT LONG TERM GOAL #1   Title Pt will be independent with HEP  Time 8    Period Weeks    Status On-going      PT LONG TERM GOAL #2   Title Pt will be able to improve 5x STS to <15 sec to demo improved functional strength and balance    Time 8    Period Weeks    Status On-going      PT LONG TERM GOAL #3   Title Pt will be able to walk 400' without a/d per pt's goals to improve amb without cane    Baseline Unsteady with use of cane    Time 8    Period Weeks    Status On-going      PT LONG TERM GOAL #4   Title Pt will report decrease in pain by at least 50% with her home activity    Baseline 7/10    Time 8    Period Weeks    Status On-going                   Plan - 12/07/20 0806     Clinical Impression Statement Therapy reviewed balance exercises with the patient. She feels like at this time her balance is limiting her more so then her back. She is having times where her back isn't hurting her as much. She is making great progress. We willr eivew gym exercises and balance exercises next visit. She will likely D/C next  week.    Personal Factors and Comorbidities Age;Fitness;Time since onset of injury/illness/exacerbation;Past/Current Experience    Examination-Activity Limitations Bathing;Bed Mobility;Bend;Caring for Others;Carry;Dressing;Hygiene/Grooming;Lift;Locomotion Level;Sit;Stand;Toileting;Transfers    Examination-Participation Restrictions Cleaning;Community Activity;Shop    Stability/Clinical Decision Making Evolving/Moderate complexity    Clinical Decision Making Moderate    Rehab Potential Good    PT Frequency 2x / week    PT Duration 8 weeks    PT Treatment/Interventions ADLs/Self Care Home Management;Aquatic Therapy;Cryotherapy;Electrical Stimulation;Iontophoresis 4mg /ml Dexamethasone;Moist Heat;DME Instruction;Gait training;Stair training;Functional mobility training;Therapeutic activities;Therapeutic exercise;Balance training;Neuromuscular re-education;Patient/family education;Manual techniques;Passive range of motion;Dry needling;Taping    PT Next Visit Plan Assess response to HEP. Manual therapy as indicated for low back. Work on and progress lumbar/pelvic/hip ROM/stretching and core/hip strengthening.    PT Home Exercise Plan Access [email or web address]    Consulted and Agree with Plan of Care Patient             Patient will benefit from skilled therapeutic intervention in order to improve the following deficits and impairments:  Abnormal gait, Decreased range of motion, Difficulty walking, Increased fascial restricitons, Decreased endurance, Decreased activity tolerance, Pain, Decreased balance, Hypomobility, Improper body mechanics, Decreased mobility, Decreased strength, Postural dysfunction  Visit Diagnosis: Muscle weakness (generalized)  Chronic bilateral low back pain without sciatica  Abnormal posture  Other abnormalities of gait and mobility     Problem List Patient Active Problem List   Diagnosis Date Noted   PAF (paroxysmal atrial fibrillation) ([place]) 11/03/2020   Stage 3b  chronic kidney disease ([place]) 05/06/2020   Chronic left-sided low back pain without sciatica 12/25/2018   Spinal stenosis in cervical region 05/12/2016   Bradycardia 01/01/2016   Therapeutic opioid-induced constipation (OIC) 01/01/2016   Chronic idiopathic constipation 09/11/2013   Lumbosacral spondylosis without myelopathy 04/09/2013   Postlaminectomy syndrome, lumbar region 04/09/2013   Routine general medical examination at a health care facility 02/01/2012   Neuropathy, peripheral 08/04/2011   Pure hypercholesterolemia 08/04/2011   Essential hypertension, benign 08/04/2011   Hypothyroidism 08/04/2011   DJD (degenerative joint disease) of knee 08/04/2011   Gout 08/04/2011    Shanon Brow  Robb Matar 12/07/2020, 8:12 AM  [facility] [address] [place], [name], [address] Phone: [phone]   Fax:  [phone]  Name: LILYAUNA MIEDEMA MRN: [id number] Date of Birth: Mar 30, 1934

## 2020-12-09 ENCOUNTER — Other Ambulatory Visit: Payer: Self-pay

## 2020-12-09 ENCOUNTER — Ambulatory Visit (HOSPITAL_BASED_OUTPATIENT_CLINIC_OR_DEPARTMENT_OTHER): Payer: PPO | Admitting: Physical Therapy

## 2020-12-09 DIAGNOSIS — M6281 Muscle weakness (generalized): Secondary | ICD-10-CM

## 2020-12-09 DIAGNOSIS — G8929 Other chronic pain: Secondary | ICD-10-CM

## 2020-12-09 DIAGNOSIS — R293 Abnormal posture: Secondary | ICD-10-CM

## 2020-12-09 DIAGNOSIS — R2689 Other abnormalities of gait and mobility: Secondary | ICD-10-CM

## 2020-12-10 ENCOUNTER — Encounter (HOSPITAL_BASED_OUTPATIENT_CLINIC_OR_DEPARTMENT_OTHER): Payer: Self-pay | Admitting: Physical Therapy

## 2020-12-10 NOTE — Therapy (Signed)
[place], [name], [address] Phone: [phone]   Fax:  [phone]  Physical Therapy Treatment  Patient Details  Name: Claudia [name] MRN: [id number] Date of Birth: 04-Feb-1934 Referring Provider (PT): Gregor Hams, MD   Encounter Date: 12/09/2020   PT End of Session - 12/10/20 1048     Visit Number 14    Number of Visits 17    Date for PT Re-Evaluation 12/03/20    Authorization Type [employer]    PT Start Time 0923    PT Stop Time 1225    PT Time Calculation (min) 40 min    Activity Tolerance Patient tolerated treatment well;No increased pain    Behavior During Therapy WFL for tasks assessed/performed             Past Medical History:  Diagnosis Date   Abnormality of gait    Brachial neuritis or radiculitis NOS    Degeneration of lumbar or lumbosacral intervertebral disc    Essential hypertension, benign    Gouty arthropathy    Lumbago    Obesity    Osteoarthritis    Pure hypercholesterolemia    Unspecified hypothyroidism     Past Surgical History:  Procedure Laterality Date   ABDOMINAL HYSTERECTOMY  03/13/2010   BACK SURGERY     LUMBAR LAMINECTOMY  03/13/2010   right knee replacement  03/13/2010    There were no vitals filed for this visit.   Subjective Assessment - 12/10/20 1009     Subjective No complaints and no pain reported. She has been owrking on the balance exercises some. She is making good progress with her home activity    Limitations Standing;Walking;House hold activities    How long can you sit comfortably? ~45 min to an hour    How long can you stand comfortably? ~10 minutes if not doing anything else; feels loss of balance    How long can you walk comfortably? Able to walk throughout the house    Diagnostic tests x-ray: Scoliosis of the lumbar spine with posterior interbody fusion at  L4-L5. Degenerative changes.    Patient Stated Goals Reduce pain  medicine, walk without a/d and walk outside    Currently in Pain? No/denies                               [facility] Adult PT Treatment/Exercise - 12/10/20 0001       High Level Balance   High Level Balance Comments narrow base off support 3x30 sec hold; narrow base 3x20 sec hold; heel Rae Lips x20; march without hold for balance: all exercises perfromed with guarding and patient shown how to do at her sink safely      Lumbar Exercises: Stretches   Active Hamstring Stretch Limitations hamstring stretch 3x20 sec hold    Lower Trunk Rotation Limitations x20    Piriformis Stretch Right;Left;30 seconds;2 reps      Lumbar Exercises: Aerobic   Stationary Bike 5 min L1      Lumbar Exercises: Machines for Strengthening   Leg Press 3x10 20 lbs      Lumbar Exercises: Standing   Heel Raises Limitations x20      Lumbar Exercises: Supine   Bridge 20 reps    Other Supine Lumbar Exercises supine march x20    Other Supine Lumbar Exercises slow march 2x10 with breathing;  PT Long Term Goals - 12/03/20 1302       PT LONG TERM GOAL #1   Title Pt will be independent with HEP    Time 8    Period Weeks    Status On-going      PT LONG TERM GOAL #2   Title Pt will be able to improve 5x STS to <15 sec to demo improved functional strength and balance    Time 8    Period Weeks    Status On-going      PT LONG TERM GOAL #3   Title Pt will be able to walk 400' without a/d per pt's goals to improve amb without cane    Baseline Unsteady with use of cane    Time 8    Period Weeks    Status On-going      PT LONG TERM GOAL #4   Title Pt will report decrease in pain by at least 50% with her home activity    Baseline 7/10    Time 8    Period Weeks    Status On-going                   Plan - 12/09/20 1636     Clinical Impression Statement Patient continues to make great progress. She tolerated balance exercises well today. Therapy  reviewed the use of an air-ex mat. She has a siimilar piece of foam at home. We continue to emphasize the importance of self gaurding with balance activity. She was intrested in trying machines. She flet even on the lowest weight that knee extension was too much. She liked the leg press. We will review complete HEP at home. She has a very goodvariety of exercises at home.    Personal Factors and Comorbidities Age;Fitness;Time since onset of injury/illness/exacerbation;Past/Current Experience    Examination-Activity Limitations Bathing;Bed Mobility;Bend;Caring for Others;Carry;Dressing;Hygiene/Grooming;Lift;Locomotion Level;Sit;Stand;Toileting;Transfers    Stability/Clinical Decision Making Evolving/Moderate complexity    Clinical Decision Making Moderate    Rehab Potential Good    PT Frequency 2x / week    PT Duration 8 weeks    PT Treatment/Interventions ADLs/Self Care Home Management;Aquatic Therapy;Cryotherapy;Electrical Stimulation;Iontophoresis 4mg /ml Dexamethasone;Moist Heat;DME Instruction;Gait training;Stair training;Functional mobility training;Therapeutic activities;Therapeutic exercise;Balance training;Neuromuscular re-education;Patient/family education;Manual techniques;Passive range of motion;Dry needling;Taping    PT Next Visit Plan Assess response to HEP. Manual therapy as indicated for low back. Work on and progress lumbar/pelvic/hip ROM/stretching and core/hip strengthening.    PT Home Exercise Plan Access Code:[email or web address]    Consulted and Agree with Plan of Care Patient             Patient will benefit from skilled therapeutic intervention in order to improve the following deficits and impairments:  Abnormal gait, Decreased range of motion, Difficulty walking, Increased fascial restricitons, Decreased endurance, Decreased activity tolerance, Pain, Decreased balance, Hypomobility, Improper body mechanics, Decreased mobility, Decreased strength, Postural dysfunction  Visit  Diagnosis: Muscle weakness (generalized)  Abnormal posture  Chronic bilateral low back pain without sciatica  Other abnormalities of gait and mobility     Problem List Patient Active Problem List   Diagnosis Date Noted   PAF (paroxysmal atrial fibrillation) ([place]) 11/03/2020   Stage 3b chronic kidney disease ([place]) 05/06/2020   Chronic left-sided low back pain without sciatica 12/25/2018   Spinal stenosis in cervical region 05/12/2016   Bradycardia 01/01/2016   Therapeutic opioid-induced constipation (OIC) 01/01/2016   Chronic idiopathic constipation 09/11/2013   Lumbosacral spondylosis without myelopathy 04/09/2013   Postlaminectomy syndrome, lumbar region 04/09/2013  Routine general medical examination at a health care facility 02/01/2012   Neuropathy, peripheral 08/04/2011   Pure hypercholesterolemia 08/04/2011   Essential hypertension, benign 08/04/2011   Hypothyroidism 08/04/2011   DJD (degenerative joint disease) of knee 08/04/2011   Gout 08/04/2011    Carney Living PT DPT  12/10/2020, 10:50 AM  [facility] [place], [name], [address] Phone: [phone]   Fax:  [phone]  Name: Claudia [name] MRN: [id number] Date of Birth: 12/20/1933

## 2020-12-11 ENCOUNTER — Ambulatory Visit (HOSPITAL_BASED_OUTPATIENT_CLINIC_OR_DEPARTMENT_OTHER): Payer: PPO | Admitting: Physical Therapy

## 2020-12-12 ENCOUNTER — Other Ambulatory Visit: Payer: Self-pay

## 2020-12-12 ENCOUNTER — Ambulatory Visit (HOSPITAL_BASED_OUTPATIENT_CLINIC_OR_DEPARTMENT_OTHER): Payer: PPO | Admitting: Physical Therapy

## 2020-12-12 DIAGNOSIS — R293 Abnormal posture: Secondary | ICD-10-CM

## 2020-12-12 DIAGNOSIS — M6281 Muscle weakness (generalized): Secondary | ICD-10-CM

## 2020-12-12 DIAGNOSIS — R2689 Other abnormalities of gait and mobility: Secondary | ICD-10-CM

## 2020-12-12 DIAGNOSIS — G8929 Other chronic pain: Secondary | ICD-10-CM

## 2020-12-14 ENCOUNTER — Encounter (HOSPITAL_BASED_OUTPATIENT_CLINIC_OR_DEPARTMENT_OTHER): Payer: Self-pay | Admitting: Physical Therapy

## 2020-12-14 NOTE — Therapy (Signed)
[place], [name], [address] Phone: [phone]   Fax:  [phone]  Physical Therapy Treatment/Discharge   Patient Details  Name: Claudia [name] MRN: [id number] Date of Birth: [birth year] Referring Provider (PT): Gregor Hams, MD   Encounter Date: 12/12/2020   PT End of Session - 12/14/20 1517     Visit Number 15    Number of Visits 17    Date for PT Re-Evaluation 12/03/20    Authorization Type [employer]    PT Start Time 4536    PT Stop Time 1226    PT Time Calculation (min) 41 min    Activity Tolerance Patient tolerated treatment well;No increased pain    Behavior During Therapy WFL for tasks assessed/performed             Past Medical History:  Diagnosis Date   Abnormality of gait    Brachial neuritis or radiculitis NOS    Degeneration of lumbar or lumbosacral intervertebral disc    Essential hypertension, benign    Gouty arthropathy    Lumbago    Obesity    Osteoarthritis    Pure hypercholesterolemia    Unspecified hypothyroidism     Past Surgical History:  Procedure Laterality Date   ABDOMINAL HYSTERECTOMY  03/13/2010   BACK SURGERY     LUMBAR LAMINECTOMY  03/13/2010   right knee replacement  03/13/2010    There were no vitals filed for this visit.   Subjective Assessment - 12/14/20 1515     Subjective Patient reports no pain after the last visit. She has been doing her exercises very other day.    Limitations Standing;Walking;House hold activities    How long can you sit comfortably? ~45 min to an hour    How long can you stand comfortably? ~10 minutes if not doing anything else; feels loss of balance    How long can you walk comfortably? Able to walk throughout the house    Diagnostic tests x-ray: Scoliosis of the lumbar spine with posterior interbody fusion at  L4-L5. Degenerative changes.    Patient Stated Goals Reduce pain medicine, walk without a/d and  walk outside    Currently in Pain? No/denies                               [facility] Adult PT Treatment/Exercise - 12/14/20 0001       Self-Care   Self-Care Other Self-Care Comments    Other Self-Care Comments  reviewed final HEP; progression of activity. How to use her HEP if she has an acute exacerbation.      Lumbar Exercises: Stretches   Active Hamstring Stretch Limitations hamstring stretch 3x20 sec hold    Lower Trunk Rotation Limitations x20    Piriformis Stretch Right;Left;30 seconds;2 reps      Lumbar Exercises: Aerobic   Stationary Bike 5 min L1      Lumbar Exercises: Seated   LAQ on Chair Limitations x15 red band each leg    Other Seated Lumbar Exercises shoulder extension red with breathing;    Other Seated Lumbar Exercises clamshell seated x20      Lumbar Exercises: Supine   Bridge 20 reps    Other Supine Lumbar Exercises supine march x20    Other Supine Lumbar Exercises slow march 2x10 with breathing;  PT Education - 12/14/20 1516     Education Details final HEP    Person(s) Educated Patient    Methods Explanation;Demonstration;Tactile cues;Verbal cues    Comprehension Verbalized understanding;Returned demonstration;Verbal cues required;Tactile cues required                 PT Long Term Goals - 12/03/20 1302       PT LONG TERM GOAL #1   Title Pt will be independent with HEP    Time 8    Period Weeks    Status On-going      PT LONG TERM GOAL #2   Title Pt will be able to improve 5x STS to <15 sec to demo improved functional strength and balance    Time 8    Period Weeks    Status On-going      PT LONG TERM GOAL #3   Title Pt will be able to walk 400' without a/d per pt's goals to improve amb without cane    Baseline Unsteady with use of cane    Time 8    Period Weeks    Status On-going      PT LONG TERM GOAL #4   Title Pt will report decrease in pain by at least 50% with her home activity     Baseline 7/10    Time 8    Period Weeks    Status On-going                   Plan - 12/14/20 1517     Clinical Impression Statement Patient has made great progress. Therapy reviewed her mat exercises and UE exercises. she had no pain. She has a good routine going with her HEP. Her long term goal is to no longer use a cane. She was advised to continue to work on her balance exercises if that is a goal. She was advised to be cautious about progressing off the cane too quickly.    Personal Factors and Comorbidities Age;Fitness;Time since onset of injury/illness/exacerbation;Past/Current Experience    Examination-Activity Limitations Bathing;Bed Mobility;Bend;Caring for Others;Carry;Dressing;Hygiene/Grooming;Lift;Locomotion Level;Sit;Stand;Toileting;Transfers    Examination-Participation Restrictions Cleaning;Community Activity;Shop    Stability/Clinical Decision Making Evolving/Moderate complexity    Clinical Decision Making Moderate    Rehab Potential Good    PT Frequency 2x / week    PT Duration 8 weeks    PT Treatment/Interventions ADLs/Self Care Home Management;Aquatic Therapy;Cryotherapy;Electrical Stimulation;Iontophoresis 25m/ml Dexamethasone;Moist Heat;DME Instruction;Gait training;Stair training;Functional mobility training;Therapeutic activities;Therapeutic exercise;Balance training;Neuromuscular re-education;Patient/family education;Manual techniques;Passive range of motion;Dry needling;Taping    PT Next Visit Plan Assess response to HEP. Manual therapy as indicated for low back. Work on and progress lumbar/pelvic/hip ROM/stretching and core/hip strengthening.    PT Home Exercise Plan Access [email or web address]   Consulted and Agree with Plan of Care Patient             Patient will benefit from skilled therapeutic intervention in order to improve the following deficits and impairments:  Abnormal gait, Decreased range of motion, Difficulty walking, Increased fascial  restricitons, Decreased endurance, Decreased activity tolerance, Pain, Decreased balance, Hypomobility, Improper body mechanics, Decreased mobility, Decreased strength, Postural dysfunction  Visit Diagnosis: Muscle weakness (generalized)  Abnormal posture  Chronic bilateral low back pain without sciatica  Other abnormalities of gait and mobility  PHYSICAL THERAPY DISCHARGE SUMMARY  Visits from Start of Care: 15  Current functional level related to goals / functional outcomes: Improved ability to ambualte and perfrom ADLs    Remaining deficits: Mild pain at  times, still using a cane   Education / Equipment: HEP    Patient agrees to discharge. Patient goals were met. Patient is being discharged due to being pleased with the current functional level.    Problem List Patient Active Problem List   Diagnosis Date Noted   PAF (paroxysmal atrial fibrillation) ([place]) 11/03/2020   Stage 3b chronic kidney disease ([place]) 05/06/2020   Chronic left-sided low back pain without sciatica 12/25/2018   Spinal stenosis in cervical region 05/12/2016   Bradycardia 01/01/2016   Therapeutic opioid-induced constipation (OIC) 01/01/2016   Chronic idiopathic constipation 09/11/2013   Lumbosacral spondylosis without myelopathy 04/09/2013   Postlaminectomy syndrome, lumbar region 04/09/2013   Routine general medical examination at a health care facility 02/01/2012   Neuropathy, peripheral 08/04/2011   Pure hypercholesterolemia 08/04/2011   Essential hypertension, benign 08/04/2011   Hypothyroidism 08/04/2011   DJD (degenerative joint disease) of knee 08/04/2011   Gout 08/04/2011    Carney Living 12/14/2020, 3:27 PM  [facility] [address] [place], [name], [address] Phone: [phone]   Fax:  [phone]  Name: Claudia [name] MRN: [id number] Date of Birth: 10/18/1933

## 2020-12-16 ENCOUNTER — Telehealth: Payer: Self-pay | Admitting: Pharmacist

## 2020-12-16 NOTE — Progress Notes (Signed)
    Chronic Care Management Pharmacy Assistant   Name: HANAA PAYES  MRN: [id number] DOB: Oct 04, 1933  Reason for Encounter: Medication Review    Medications: Outpatient Encounter Medications as of 12/16/2020  Medication Sig   apixaban (ELIQUIS) 5 MG TABS tablet Take 1 tablet (5 mg total) by mouth 2 (two) times daily.   cetirizine (ZYRTEC) 10 MG tablet Take 1 tablet (10 mg total) by mouth daily.   diclofenac Sodium (VOLTAREN) 1 % GEL Apply topically 4 (four) times daily.   diltiazem (CARDIZEM) 30 MG tablet TAKE 1 TABLET BY MOUTH EVERY 4 HOURS AS NEEDED FOR AFIB HEART RATE GREATER THAN 100 AS LONG AS BLOOD PRESSURE LESS THAN 100   HYDROcodone-acetaminophen (NORCO/VICODIN) 5-325 MG tablet TAKE ONE TABLET BY MOUTH EVERY 6 HOURS PRIOR TO MEALS FOR moderate pain   indapamide (LOZOL) 1.25 MG tablet Take 1 tablet (1.25 mg total) by mouth daily.   linaclotide (LINZESS) 290 MCG CAPS capsule Take 1 capsule (290 mcg total) by mouth daily before breakfast.   rosuvastatin (CRESTOR) 10 MG tablet Take 1 tablet (10 mg total) by mouth daily.   vitamin C (ASCORBIC ACID) 500 MG tablet Take 500 mg by mouth daily.   vitamin E 180 MG (400 UNITS) capsule Take 400 Units by mouth daily.   No facility-administered encounter medications on file as of 12/16/2020.   Received call from patient regarding medication management via [facility].  Patient requested an acute fill for Hydrocodone 5 mg - acetaminophen 325 mg tab to be delivered: 12/26/20 Pharmacy needs refills? Yes  Confirmed delivery date of 12/26/20, advised patient that pharmacy will contact them the morning of delivery.  Star Rating Drugs: Rosuvastatin - 10/27/20 [place], [place] Clinical Pharmacists Assistant [phone]  Time Spent: 30

## 2020-12-22 ENCOUNTER — Other Ambulatory Visit: Payer: Self-pay | Admitting: Internal Medicine

## 2020-12-22 DIAGNOSIS — M961 Postlaminectomy syndrome, not elsewhere classified: Secondary | ICD-10-CM

## 2020-12-22 DIAGNOSIS — M47817 Spondylosis without myelopathy or radiculopathy, lumbosacral region: Secondary | ICD-10-CM

## 2020-12-22 DIAGNOSIS — M170 Bilateral primary osteoarthritis of knee: Secondary | ICD-10-CM

## 2021-01-01 ENCOUNTER — Telehealth: Payer: Self-pay | Admitting: Cardiovascular Disease

## 2021-01-01 NOTE — Telephone Encounter (Signed)
Spoke with pt, aware samples at the front desk for pick up as well as the application for patient assistance.

## 2021-01-01 NOTE — Telephone Encounter (Signed)
Pt c/o medication issue: 1. Name of Medication: Eliquis  2. How are you currently taking this medication (dosage and times per day)? BID 3. Are you having a reaction (difficulty breathing--STAT)?  no 4. What is your medication issue? Patient need a assistants   Patient calling the office for samples of medication:   1.  What medication and dosage are you requesting samples for? Eliquis   2.  Are you currently out of this medication? Patient almost out  cell [phone]  .

## 2021-01-02 DIAGNOSIS — Z961 Presence of intraocular lens: Secondary | ICD-10-CM | POA: Diagnosis not present

## 2021-01-15 ENCOUNTER — Telehealth: Payer: Self-pay | Admitting: Pharmacist

## 2021-01-15 NOTE — Progress Notes (Signed)
    Chronic Care Management Pharmacy Assistant   Name: Claudia [name]  MRN: [id number] DOB: [birth year]   Reason for Encounter: Medication [facility] visits:  None in previous 6 months  Medications: Outpatient Encounter Medications as of 01/15/2021  Medication Sig   apixaban (ELIQUIS) 5 MG TABS tablet Take 1 tablet (5 mg total) by mouth 2 (two) times daily.   cetirizine (ZYRTEC) 10 MG tablet Take 1 tablet (10 mg total) by mouth daily.   diclofenac Sodium (VOLTAREN) 1 % GEL Apply topically 4 (four) times daily.   diltiazem (CARDIZEM) 30 MG tablet TAKE 1 TABLET BY MOUTH EVERY 4 HOURS AS NEEDED FOR AFIB HEART RATE GREATER THAN 100 AS LONG AS BLOOD PRESSURE LESS THAN 100   HYDROcodone-acetaminophen (NORCO/VICODIN) 5-325 MG tablet TAKE ONE TABLET BY MOUTH EVERY 8 HOURS BEFORE meals   indapamide (LOZOL) 1.25 MG tablet Take 1 tablet (1.25 mg total) by mouth daily.   linaclotide (LINZESS) 290 MCG CAPS capsule Take 1 capsule (290 mcg total) by mouth daily before breakfast.   rosuvastatin (CRESTOR) 10 MG tablet Take 1 tablet (10 mg total) by mouth daily.   vitamin C (ASCORBIC ACID) 500 MG tablet Take 500 mg by mouth daily.   vitamin E 180 MG (400 UNITS) capsule Take 400 Units by mouth daily.   No facility-administered encounter medications on file as of 01/15/2021.   Medications apixaban (ELIQUIS) 5 MG TABS tablet Last fill 10/16/20 cetirizine (ZYRTEC) 10 MG tablet  diclofenac Sodium (VOLTAREN) 1 % GEL diltiazem (CARDIZEM) 30 MG tablet last fill 05/14/20 90  HYDROcodone-acetaminophen (NORCO/VICODIN) 5-325 MG tablet indapamide (LOZOL) 1.25 MG tabletLast fill 10/22/20 linaclotide (LINZESS) 290 MCG CAPS capsule rosuvastatin (CRESTOR) 10 MG tablet last fill 10/27/20 vitamin C (ASCORBIC ACID) 500 MG table vitamin E 180 MG (400 UNITS) capsule  Reviewed chart for medication changes ahead of medication coordination call.  No OVs, Consults, or hospital visits since last care coordination  call/Pharmacist visit. (If appropriate, list visit date, provider name)  No medication changes indicated OR if recent visit, treatment plan here.  BP Readings from Last 3 Encounters:  11/03/20 (!) 142/70  10/23/20 139/71  09/25/20 140/70    Lab Results  Component Value Date   HGBA1C 5.5 11/03/2020     Patient obtains medications through Vials  30 Days   Last adherence delivery included:  Rosuvastatin 10 mg 1 tab daily   Patient is due for next adherence delivery on: 01/27/21.  Called patient and reviewed medications and coordinated delivery. This delivery to include: Rosuvastatin 10 mg 1 tab daily  Patient needs refills for none noted.  Confirmed delivery date of 01/27/21, advised patient that pharmacy will contact them the morning of delivery.    Star Rating Drugs: Rosuvastatin 10 mg 10/27/20 90 ds  Ethelene Hal Clinical Pharmacist Assistant [phone]   Time spent:20

## 2021-01-22 ENCOUNTER — Other Ambulatory Visit: Payer: Self-pay | Admitting: Internal Medicine

## 2021-01-22 DIAGNOSIS — M170 Bilateral primary osteoarthritis of knee: Secondary | ICD-10-CM

## 2021-01-22 DIAGNOSIS — M961 Postlaminectomy syndrome, not elsewhere classified: Secondary | ICD-10-CM

## 2021-01-22 DIAGNOSIS — M47817 Spondylosis without myelopathy or radiculopathy, lumbosacral region: Secondary | ICD-10-CM

## 2021-01-27 ENCOUNTER — Telehealth: Payer: Self-pay

## 2021-01-27 ENCOUNTER — Telehealth: Payer: Self-pay | Admitting: Cardiovascular Disease

## 2021-01-27 NOTE — Telephone Encounter (Signed)
Spoke to patient advised office is out of Eliquis 5 mg samples.Stated she printed off patient assistance form.She will complete and bring to office along with proof of her income.Advised to take to [place] location.Follow up appointment scheduled with Dr.[place] 03/06/21 at 11:40 am.Advised I will send message to Dr.[place]'s nurse.

## 2021-01-27 NOTE — Telephone Encounter (Addendum)
Spoke with patient and advised 2 weeks worth placed at front desk [place] location  Eliquis 5 mg LOT: [id number]......Marland KitchenEXP: JUL 2024 She will bring patient assistance papers when she picks up

## 2021-01-27 NOTE — Telephone Encounter (Signed)
Patient was returning a call

## 2021-01-27 NOTE — Telephone Encounter (Signed)
Patient calling the office for samples of medication:   1.  What medication and dosage are you requesting samples for? Eliquis   2.  Are you currently out of this medication? No     Patient has 4 left for two days.

## 2021-01-27 NOTE — Telephone Encounter (Signed)
Called patient left message on personal voice mail office out of Eliquis 5 mg samples.

## 2021-01-27 NOTE — Telephone Encounter (Signed)
See previous 8/2 telephone note.

## 2021-02-12 ENCOUNTER — Other Ambulatory Visit: Payer: Self-pay | Admitting: Internal Medicine

## 2021-02-12 DIAGNOSIS — M961 Postlaminectomy syndrome, not elsewhere classified: Secondary | ICD-10-CM

## 2021-02-12 DIAGNOSIS — M47817 Spondylosis without myelopathy or radiculopathy, lumbosacral region: Secondary | ICD-10-CM

## 2021-02-12 DIAGNOSIS — M170 Bilateral primary osteoarthritis of knee: Secondary | ICD-10-CM

## 2021-02-16 ENCOUNTER — Telehealth: Payer: Self-pay | Admitting: Pharmacist

## 2021-02-16 NOTE — Progress Notes (Signed)
    Chronic Care Management Pharmacy Assistant   Name: MURRIEL WAYE  MRN: [id number] DOB: [birth year]   Reason for Encounter: Medication Review   Medications: Outpatient Encounter Medications as of 02/16/2021  Medication Sig   apixaban (ELIQUIS) 5 MG TABS tablet Take 1 tablet (5 mg total) by mouth 2 (two) times daily.   cetirizine (ZYRTEC) 10 MG tablet Take 1 tablet (10 mg total) by mouth daily.   diclofenac Sodium (VOLTAREN) 1 % GEL Apply topically 4 (four) times daily.   diltiazem (CARDIZEM) 30 MG tablet TAKE 1 TABLET BY MOUTH EVERY 4 HOURS AS NEEDED FOR AFIB HEART RATE GREATER THAN 100 AS LONG AS BLOOD PRESSURE LESS THAN 100   HYDROcodone-acetaminophen (NORCO/VICODIN) 5-325 MG tablet Take ONE tablet by MOUTH every EIGHT hours BEFORE meals   indapamide (LOZOL) 1.25 MG tablet Take 1 tablet (1.25 mg total) by mouth daily.   linaclotide (LINZESS) 290 MCG CAPS capsule Take 1 capsule (290 mcg total) by mouth daily before breakfast.   rosuvastatin (CRESTOR) 10 MG tablet Take 1 tablet (10 mg total) by mouth daily.   vitamin C (ASCORBIC ACID) 500 MG tablet Take 500 mg by mouth daily.   vitamin E 180 MG (400 UNITS) capsule Take 400 Units by mouth daily.   No facility-administered encounter medications on file as of 02/16/2021.    Reviewed chart for medication changes ahead of medication coordination call.  No OVs, Consults, or hospital visits since last care coordination call/Pharmacist visit. (If appropriate, list visit date, provider name)  No medication changes indicated OR if recent visit, treatment plan here.  BP Readings from Last 3 Encounters:  11/03/20 (!) 142/70  10/23/20 139/71  09/25/20 140/70    Lab Results  Component Value Date   HGBA1C 5.5 11/03/2020     Patient obtains medications through Vials  30 Days   Last adherence delivery included: Rosuvastatin 10 mg 1 tab daily  Patient is due for next adherence delivery on: 02/25/21.  Called patient and reviewed  medications and coordinated delivery. This delivery to include: Rosuvastatin 10 mg 1 tab daily Hydrocodone 5/325 mg 1 tab every 8 hours  Patient needs refills for Hydrocodone 5/325 -CPP to request.  Confirmed delivery date of 02/25/21, advised patient that pharmacy will contact them the morning of delivery.   Star Rating Drugs: Rosuvastatin 10 mg-last fill 01/22/21 30 ds  Ethelene Hal Clinical Pharmacist Assistant [phone]   Time spent:20

## 2021-03-06 ENCOUNTER — Other Ambulatory Visit: Payer: Self-pay

## 2021-03-06 ENCOUNTER — Encounter (HOSPITAL_BASED_OUTPATIENT_CLINIC_OR_DEPARTMENT_OTHER): Payer: Self-pay | Admitting: Cardiovascular Disease

## 2021-03-06 ENCOUNTER — Ambulatory Visit (HOSPITAL_BASED_OUTPATIENT_CLINIC_OR_DEPARTMENT_OTHER): Payer: PPO | Admitting: Cardiovascular Disease

## 2021-03-06 VITALS — BP 124/62 | HR 57 | Ht 70.0 in | Wt 205.5 lb

## 2021-03-06 DIAGNOSIS — I480 Paroxysmal atrial fibrillation: Secondary | ICD-10-CM | POA: Diagnosis not present

## 2021-03-06 DIAGNOSIS — I10 Essential (primary) hypertension: Secondary | ICD-10-CM

## 2021-03-06 NOTE — Progress Notes (Signed)
Cardiology Office Note   Date:  03/06/2021   ID:  [name], Claudia 12-Feb-1934, MRN [id number]  PCP:  Claudia Lima, MD  Cardiologist:   Claudia Latch, MD   No chief complaint on file.    History of Present Illness: Claudia [name] is a 87 y.o. female with atrial fibrillation, hyperlipidemia, hyperlipidemia, and hypothyroidism who is being seen today for follow-up. Claudia [name] saw Claudia [name] 03/2016 for bradycardia.  At that time she was referred for an echo that revealed LVEF 55-60% with moderate LVH. She was asymptotic at the time. She saw Claudia Deforest, PA on 04/16/20 and reported palpitations. She wore an ambulatory monitor 06/16/20, revealed episodes of atrial fibrillation. She was seen in EP clinic and started on Eliquis.   Today, patient is doing well. She experienced episodes of atrial fibrillation three times since November. Her blood pressure at home ranges systolic [id number]. She stated her insurance told her to try the medication Warfarin since Eliquis was too expensive. Her 78 year old husband is in the early stage of Dementia and she currently has no help to take care of him. She denies any shortness of breath,chest pains, lightheadedness, dizziness or chest pressure. She was attending the [facility] program but stopped due to insurance not paying for it however plans to join [employer] program at [employer].  Past Medical History:  Diagnosis Date   Abnormality of gait    Brachial neuritis or radiculitis NOS    Degeneration of lumbar or lumbosacral intervertebral disc    Essential hypertension, benign    Gouty arthropathy    Lumbago    Obesity    Osteoarthritis    Pure hypercholesterolemia    Unspecified hypothyroidism     Past Surgical History:  Procedure Laterality Date   ABDOMINAL HYSTERECTOMY  03/13/2010   BACK SURGERY     LUMBAR LAMINECTOMY  03/13/2010   right knee replacement  03/13/2010     Current Outpatient Medications  Medication Sig Dispense  Refill   apixaban (ELIQUIS) 5 MG TABS tablet Take 1 tablet (5 mg total) by mouth 2 (two) times daily. 180 tablet 1   cetirizine (ZYRTEC) 10 MG tablet Take 1 tablet (10 mg total) by mouth daily. 90 tablet 1   diclofenac Sodium (VOLTAREN) 1 % GEL Apply topically 4 (four) times daily.     diltiazem (CARDIZEM) 30 MG tablet TAKE 1 TABLET BY MOUTH EVERY 4 HOURS AS NEEDED FOR AFIB HEART RATE GREATER THAN 100 AS LONG AS BLOOD PRESSURE LESS THAN 100 60 tablet 1   HYDROcodone-acetaminophen (NORCO/VICODIN) 5-325 MG tablet Take ONE tablet by MOUTH every EIGHT hours BEFORE meals 90 tablet 0   indapamide (LOZOL) 1.25 MG tablet Take 1 tablet (1.25 mg total) by mouth daily. 90 tablet 1   linaclotide (LINZESS) 290 MCG CAPS capsule Take 1 capsule (290 mcg total) by mouth daily before breakfast. 90 capsule 1   rosuvastatin (CRESTOR) 10 MG tablet Take 1 tablet (10 mg total) by mouth daily. 90 tablet 1   vitamin C (ASCORBIC ACID) 500 MG tablet Take 500 mg by mouth daily.     vitamin E 180 MG (400 UNITS) capsule Take 400 Units by mouth daily.     No current facility-administered medications for this visit.    Allergies:   Lipitor [atorvastatin] and Trileptal [oxcarbazepine]    Social History:  The patient  reports that she has never smoked. She has never used smokeless tobacco. She reports that she does not  drink alcohol and does not use drugs.   Family History:  The patient's family history includes Alzheimer's disease in her mother; Cirrhosis in her brother; Diabetes in her brother, sister, sister, and sister; Heart attack in her brother and father; Heart disease in her father; Hypertension in her mother.    ROS:  Please see the history of present illness.   PHYSICAL EXAM: VS:  BP 124/62 (BP Location: Right Arm, Patient Position: Sitting)   Pulse (!) 57   Ht '5\' 10"'$  (1.778 m)   Wt 205 lb 8 oz (93.2 kg)   BMI 29.49 kg/m  , BMI Body mass index is 29.49 kg/m. GENERAL:  Well appearing HEENT:  Pupils equal  round and reactive, fundi not visualized, oral mucosa unremarkable NECK:  No jugular venous distention, waveform within normal limits, carotid upstroke brisk and symmetric, no bruits LUNGS:  Clear to auscultation bilaterally HEART:  Bradycardic.  Regular rhythm.  PMI not displaced or sustained,S1 and S2 within normal limits, no S3, no S4, no clicks, no rubs, no murmurs ABD:  Flat, positive bowel sounds normal in frequency in pitch, no bruits, no rebound, no guarding, no midline pulsatile mass, no hepatomegaly, no splenomegaly EXT:  2 plus pulses throughout, no edema, no cyanosis no clubbing SKIN:  No rashes no nodules NEURO:  Cranial nerves II through XII grossly intact, motor grossly intact throughout PSYCH:  Cognitively intact, oriented to person place and time   EKG:  2/19:sinus bradycardia.  Rate 49 bpm.   9/22: sinus bradycardia.  Rate 57 bpm.  CARDIAC TELEMETRY 12/21: 30 Day Event Monitor   Quality: Fair.  Baseline artifact. Predominant rhythm: sinus rhythm Average heart rate: 62 bpm Max heart rate: 197 bpm Min heart rate: 35 bpm Pauses >2.5 seconds: none   Episodes of atrial fibrillation up to 21 minutes Atrial runs Up to 13 beats NSVT PVCs and ventricular bigeminy  Echo 03/30/16: Study Conclusions   - Left ventricle: The cavity size was normal. Wall thickness was   increased in a pattern of moderate LVH. There was focal basal   hypertrophy. Systolic function was normal. The estimated ejection   fraction was in the range of 55% to 60%. Wall motion was normal;   there were no regional wall motion abnormalities. Left   ventricular diastolic function parameters were normal for the   patient&'s age. - Aortic valve: There was trivial regurgitation. - Left atrium: The atrium was moderately dilated. - Pulmonary arteries: Systolic pressure was mildly increased. PA   peak pressure: 31 mm Hg (S).  Recent Labs: 05/05/2020: ALT 9 06/11/2020: Hemoglobin 12.2; Platelets  200 11/04/2020: BUN 23; Creatinine, Ser 1.22; Potassium 4.6; Sodium 139; TSH 2.88    Lipid Panel    Component Value Date/Time   CHOL 138 05/05/2020 1412   TRIG 104.0 05/05/2020 1412   HDL 52.40 05/05/2020 1412   CHOLHDL 3 05/05/2020 1412   VLDL 20.8 05/05/2020 1412   LDLCALC 65 05/05/2020 1412   LDLDIRECT 158.9 09/05/2012 1038      Wt Readings from Last 3 Encounters:  03/06/21 205 lb 8 oz (93.2 kg)  11/03/20 212 lb (96.2 kg)  10/23/20 215 lb 3.2 oz (97.6 kg)      ASSESSMENT AND PLAN:  Essential hypertension, benign BP was initially elevated but better on repeat.  Continue indapamide.  She only takes diltiazem as needed for HR, which is rare.  PAF (paroxysmal atrial fibrillation) ([place]) She is currently in sinus rhythm.  Episodes are rare.  She cannot  afford Eliquis and wants to switch to to warfarin.  We will refer her to our pharmacist.   Current medicines are reviewed at length with the patient today.  The patient does not have concerns regarding medicines.  The following changes have been made:  switch Eliquis to warfarin  Labs/ tests ordered today include:   Orders Placed This Encounter  Procedures   AMB Referral to [facility] Pharm-D   EKG 12-Lead    Disposition:   FU with Soren Lazarz C. Oval Linsey, MD, [facility] in 6 months   This note was written with the assistance of speech recognition software.  Please excuse any transcriptional errors.   I,Jada Bradford,acting as a Education administrator for Claudia Latch, MD.,have documented all relevant documentation on the behalf of Claudia Latch, MD,as directed by  Claudia Latch, MD while in the presence of Claudia Latch, MD.  I, [place] Oval Linsey, MD have reviewed all documentation for this visit.  The documentation of the exam, diagnosis, procedures, and orders on 03/06/2021 are all accurate and complete.   Signed, Adelaide Pfefferkorn C. Oval Linsey, MD, [facility]  03/06/2021 1:05 PM    [facility]

## 2021-03-06 NOTE — Patient Instructions (Addendum)
Medication Instructions:  Your physician recommends that you continue on your current medications as directed. Please refer to the Current Medication list given to you today.   *If you need a refill on your cardiac medications before your next appointment, please call your pharmacy*  Lab Work: NONE   Testing/Procedures: NONE   Follow-Up: At [facility], you and your health needs are our priority.  As part of our continuing mission to provide you with exceptional heart care, we have created designated Provider Care Teams.  These Care Teams include your primary Cardiologist (physician) and Advanced Practice Providers (APPs -  Physician Assistants and Nurse Practitioners) who all work together to provide you with the care you need, when you need it.  We recommend signing up for the patient portal called "MyChart".  Sign up information is provided on this After Visit Summary.  MyChart is used to connect with patients for Virtual Visits (Telemedicine).  Patients are able to view lab/test results, encounter notes, upcoming appointments, etc.  Non-urgent messages can be sent to your provider as well.   To learn more about what you can do with MyChart, go to [email or web address].    Your next appointment:   6 month(s)  The format for your next appointment:   In Person  Provider:   Skeet Latch, MD  Your physician recommends that you schedule a follow-up appointment in: 03/09/2022 2:30 PM AT [place] [address]

## 2021-03-06 NOTE — Assessment & Plan Note (Signed)
BP was initially elevated but better on repeat.  Continue indapamide.  She only takes diltiazem as needed for HR, which is rare.

## 2021-03-06 NOTE — Assessment & Plan Note (Signed)
She is currently in sinus rhythm.  Episodes are rare.  She cannot afford Eliquis and wants to switch to to warfarin.  We will refer her to our pharmacist.

## 2021-03-09 ENCOUNTER — Ambulatory Visit (INDEPENDENT_AMBULATORY_CARE_PROVIDER_SITE_OTHER): Payer: PPO | Admitting: Pharmacist

## 2021-03-09 ENCOUNTER — Other Ambulatory Visit: Payer: Self-pay

## 2021-03-09 DIAGNOSIS — I480 Paroxysmal atrial fibrillation: Secondary | ICD-10-CM

## 2021-03-09 DIAGNOSIS — Z5181 Encounter for therapeutic drug level monitoring: Secondary | ICD-10-CM | POA: Diagnosis not present

## 2021-03-09 DIAGNOSIS — Z7901 Long term (current) use of anticoagulants: Secondary | ICD-10-CM | POA: Diagnosis not present

## 2021-03-09 NOTE — Progress Notes (Signed)
Patient ID: IRYS MEINER                 DOB: Nov 11, 1933                    MRN: [id number]     HPI: Claudia [name] is a 87 y.o. female patient referred to pharmD to discuss anticoagulation by Dr. Oval Linsey. PMH is significant for paroxsymal atrial fibrillation (a fib burden < 1% per 1 month monitor in 05/2020), HTN, HLD, CKD, gout, and hypothyroidism. Patient has been on Eliquis, but is having insurance issues and was told by her insurance to try warfarin. Has had insurance issues since 12/2020 where she was given patient assistance application and samples. Per the last note from cardiology on 03/06/21 she has only had 3 episodes of a fib since November. She also attended the [employer], but her insurance is no longer paying for it so plans to join [employer] at [employer].   Patient presents today in good spirits. We reviewed how anticoagulation protects her from having a stroke when she has a fib episodes. She has about 10 days left of her Eliquis. She notes she did submit patient assistance for Eliquis, but was denied. Her current copay for Eliquis in the [place]ut hole is $129 for 1 month. I explained to the patient that her co-pay will reset again in January so we would want to restart Eliquis at that point due to superior efficacy and decreased bleeding vs. warfarin.   We discussed that with warfarin she would need to have her INR checked weekly to start until we find an appropriate dose, at which point monitoring could be stretched out. She is the sole caregiver for her 64 year old husband who has dementia. Traveling to clinic is difficult and she walks with a cane making frequent INR checks difficult.   I explained that warfarin interacts with many medications and to that she should alert her doctor if any medications are changed if we were to start warfarin. We also discussed the importance of having a consistent vitamin K intake from foods such as leafy green vegetables as that could  decrease her INR with warfarin.   CHA2DS2-VASc Score = 4  This indicates a 4.8% annual risk of stroke. The patient's score is based upon: CHF History: 0 HTN History: 1 Diabetes History: 0 Stroke History: 0 Vascular Disease History: 0 Age Score: 2 Gender Score: 1   Past Medical History:  Diagnosis Date   Abnormality of gait    Brachial neuritis or radiculitis NOS    Degeneration of lumbar or lumbosacral intervertebral disc    Essential hypertension, benign    Gouty arthropathy    Lumbago    Obesity    Osteoarthritis    Pure hypercholesterolemia    Unspecified hypothyroidism     Current Outpatient Medications on File Prior to Visit  Medication Sig Dispense Refill   apixaban (ELIQUIS) 5 MG TABS tablet Take 1 tablet (5 mg total) by mouth 2 (two) times daily. 180 tablet 1   cetirizine (ZYRTEC) 10 MG tablet Take 1 tablet (10 mg total) by mouth daily. 90 tablet 1   diclofenac Sodium (VOLTAREN) 1 % GEL Apply topically 4 (four) times daily.     diltiazem (CARDIZEM) 30 MG tablet TAKE 1 TABLET BY MOUTH EVERY 4 HOURS AS NEEDED FOR AFIB HEART RATE GREATER THAN 100 AS LONG AS BLOOD PRESSURE LESS THAN 100 60 tablet 1   HYDROcodone-acetaminophen (NORCO/VICODIN) 5-325  MG tablet Take ONE tablet by MOUTH every EIGHT hours BEFORE meals 90 tablet 0   indapamide (LOZOL) 1.25 MG tablet Take 1 tablet (1.25 mg total) by mouth daily. 90 tablet 1   linaclotide (LINZESS) 290 MCG CAPS capsule Take 1 capsule (290 mcg total) by mouth daily before breakfast. 90 capsule 1   rosuvastatin (CRESTOR) 10 MG tablet Take 1 tablet (10 mg total) by mouth daily. 90 tablet 1   vitamin C (ASCORBIC ACID) 500 MG tablet Take 500 mg by mouth daily.     vitamin E 180 MG (400 UNITS) capsule Take 400 Units by mouth daily.     No current facility-administered medications on file prior to visit.    Allergies  Allergen Reactions   Lipitor [Atorvastatin] Other (See Comments)    Muscle aches   Trileptal [Oxcarbazepine] Other  (See Comments)    confusion    Assessment/Plan:  1. A fib- Despite low a fib burden, patient requires anticoagulation with warfarin or DOAC due to CHA2DS2-VASc Score = 4. Elliquis has become cost prohibitive, however patient would like to remain on Eliquis if possible after learning of monitoring required with warfarin and interactions. Patient was given 1 month of Eliquis samples that should cover her to the end of October. Patient agreeable to pay the next 1 month co-pay of Eliquis despite the cost increase to remain on it. Ideally, will plan to continue Eliquis for the remainder of the year, and copay will reset to affordable cost in January.   Cathrine Muster, PharmD PGY2 Cardiology Pharmacy Resident  Patient seen with Fuller Canada, PharmD, BCACP< CPP

## 2021-03-17 ENCOUNTER — Other Ambulatory Visit: Payer: Self-pay | Admitting: Internal Medicine

## 2021-03-17 DIAGNOSIS — M170 Bilateral primary osteoarthritis of knee: Secondary | ICD-10-CM

## 2021-03-17 DIAGNOSIS — M47817 Spondylosis without myelopathy or radiculopathy, lumbosacral region: Secondary | ICD-10-CM

## 2021-03-17 DIAGNOSIS — M961 Postlaminectomy syndrome, not elsewhere classified: Secondary | ICD-10-CM

## 2021-03-18 ENCOUNTER — Other Ambulatory Visit: Payer: Self-pay | Admitting: Internal Medicine

## 2021-03-18 ENCOUNTER — Telehealth: Payer: Self-pay | Admitting: Pharmacist

## 2021-03-18 DIAGNOSIS — M170 Bilateral primary osteoarthritis of knee: Secondary | ICD-10-CM

## 2021-03-18 DIAGNOSIS — M961 Postlaminectomy syndrome, not elsewhere classified: Secondary | ICD-10-CM

## 2021-03-18 DIAGNOSIS — M47817 Spondylosis without myelopathy or radiculopathy, lumbosacral region: Secondary | ICD-10-CM

## 2021-03-18 MED ORDER — HYDROCODONE-ACETAMINOPHEN 5-325 MG PO TABS: ORAL_TABLET | ORAL | 0 refills | Status: DC

## 2021-03-18 NOTE — Progress Notes (Signed)
    Chronic Care Management Pharmacy Assistant   Name: Claudia [name]  MRN: [id number] DOB: Dec 21, 1933   Reason for Encounter: Medication Review    Recent office visits:  None ID  Recent consult visits:  03/09/21 Leeroy Bock, RPH-CPP (Pharmacist) Afib  Anticoagulation management   03/06/21 Skeet Latch, MD-Cardiology (Afib) orders for Pharm-D, no med changes  Hospital visits:  None in previous 6 months  Medications: Outpatient Encounter Medications as of 03/18/2021  Medication Sig   apixaban (ELIQUIS) 5 MG TABS tablet Take 1 tablet (5 mg total) by mouth 2 (two) times daily.   cetirizine (ZYRTEC) 10 MG tablet Take 1 tablet (10 mg total) by mouth daily.   diclofenac Sodium (VOLTAREN) 1 % GEL Apply topically 4 (four) times daily.   diltiazem (CARDIZEM) 30 MG tablet TAKE 1 TABLET BY MOUTH EVERY 4 HOURS AS NEEDED FOR AFIB HEART RATE GREATER THAN 100 AS LONG AS BLOOD PRESSURE LESS THAN 100   HYDROcodone-acetaminophen (NORCO/VICODIN) 5-325 MG tablet Take ONE tablet by MOUTH every EIGHT hours BEFORE meals   indapamide (LOZOL) 1.25 MG tablet Take 1 tablet (1.25 mg total) by mouth daily.   linaclotide (LINZESS) 290 MCG CAPS capsule Take 1 capsule (290 mcg total) by mouth daily before breakfast.   rosuvastatin (CRESTOR) 10 MG tablet Take 1 tablet (10 mg total) by mouth daily.   vitamin C (ASCORBIC ACID) 500 MG tablet Take 500 mg by mouth daily.   vitamin E 180 MG (400 UNITS) capsule Take 400 Units by mouth daily.   No facility-administered encounter medications on file as of 03/18/2021.   Reviewed chart for medication changes ahead of medication coordination call.  No OVs, Consults, or hospital visits since last care coordination call/Pharmacist visit. (If appropriate, list visit date, provider name)  No medication changes indicated OR if recent visit, treatment plan here.  BP Readings from Last 3 Encounters:  03/06/21 124/62  11/03/20 (!) 142/70  10/23/20 139/71    Lab  Results  Component Value Date   HGBA1C 5.5 11/03/2020     Patient obtains medications through Vials  30 Days   Last adherence delivery included:  Rosuvastatin 10 mg 1 tab daily Hydrocodone 5/325 mg 1 tab every 8 hours  Patient is due for next adherence delivery on: 03/26/21.  Called patient and reviewed medications and coordinated delivery. This delivery to include: Rosuvastatin 10 mg 1 tab daily Hydrocodone 5/325 mg 1 tab every 8 hours   Patient declined the following medications Eliquis to expensive right now  Patient needs refills for none noted.  Confirmed delivery date of 03/26/21 after 3pm, advised patient that pharmacy will contact them the morning of delivery.   [place] Pharmacist Assistant [phone]   Time spent:24

## 2021-03-20 ENCOUNTER — Ambulatory Visit (INDEPENDENT_AMBULATORY_CARE_PROVIDER_SITE_OTHER): Payer: PPO | Admitting: Pharmacist

## 2021-03-20 ENCOUNTER — Other Ambulatory Visit: Payer: Self-pay

## 2021-03-20 DIAGNOSIS — M47817 Spondylosis without myelopathy or radiculopathy, lumbosacral region: Secondary | ICD-10-CM

## 2021-03-20 DIAGNOSIS — N1831 Chronic kidney disease, stage 3a: Secondary | ICD-10-CM

## 2021-03-20 DIAGNOSIS — I10 Essential (primary) hypertension: Secondary | ICD-10-CM

## 2021-03-20 DIAGNOSIS — E7800 Pure hypercholesterolemia, unspecified: Secondary | ICD-10-CM

## 2021-03-20 DIAGNOSIS — I480 Paroxysmal atrial fibrillation: Secondary | ICD-10-CM

## 2021-03-20 NOTE — Patient Instructions (Signed)
Visit Information  Phone number for Pharmacist: [phone]   Goals Addressed             This Visit's Progress    Keep Low Back Pain Under Control       Timeframe:  Long-Range Goal Priority:  High Start Date:     09/16/20                        Expected End Date:    03/27/22                   Follow Up Date March 2023   - call for medicine refill 2 or 3 days before it runs out - keep track of prescription refills - plan exercise or activity when pain is best controlled - stay active - track times pain is worst and when it is best - use ice or heat for pain relief - work slower and less intense when having pain    Why is this important?   Day-to-day life can be hard when you have back pain.  Pain medicine is just one piece of the treatment puzzle. There are many things you can do to manage pain and keep your back strong.   Lifestyle changes, like stopping smoking and eating foods with Vitamin D and calcium, keep your bones and muscles healthy. Your back is better when it is supported by strong muscles.  You can try these action steps to help you manage your pain.     Notes:         Care Plan : CCM Pharmacy Care Plan  Updates made by Charlton Haws, RPH since 03/20/2021 12:00 AM     Problem: Hypertension, Hyperlipidemia, Atrial Fibrillation, Chronic Kidney Disease and Osteoarthritis   Priority: High     Long-Range Goal: Disease management   Start Date: 09/16/2020  Expected End Date: 03/19/2021  This Visit's Progress: On track  Recent Progress: On track  Priority: High  Note:   Current Barriers:  Unable to independently monitor therapeutic efficacy  Pharmacist Clinical Goal(s):  Patient will achieve adherence to monitoring guidelines and medication adherence to achieve therapeutic efficacy  Interventions: 1:1 collaboration with Janith Lima, MD regarding development and update of comprehensive plan of care as evidenced by provider attestation and  co-signature Inter-disciplinary care team collaboration (see longitudinal plan of care) Comprehensive medication review performed; medication list updated in electronic medical record  Hypertension / CKD stage 3a (BP goal <130/80) -Controlled - home BP is at goal -Current treatment: Indapamide 1.25 mg daily -Medications previously tried: telmisartan/HCTZ  -Current home readings: 126/68 -Current exercise habits: limited due to back pain -Denies hypotensive/hypertensive symptoms -Educated on BP goals and benefits of medications for prevention of heart attack, stroke and kidney damage; -Counseled to monitor BP at home weekly -Recommended to continue current medication and stay hydrated  Atrial Fibrillation (Goal: prevent stroke and major bleeding) -Controlled - pt recently saw cardiology and will continue Eliquis; she has samples to get her through end of October; she will try to get more samples to get her through the end of the year at that time -New onset 04/2020. Low Afib burden per monitor. -CHADSVASC: 4 -Current treatment: Rate control: Diltiazem 30 mg q4h PRN HR > 100 Anticoagulation: Eliquis 5 mg BID -Counseled Eliquis samples are not always available, pt can call PCP and cardiology office 1-2 weeks before she needs more -Recommended to continue current medication  Hyperlipidemia: (LDL goal <  100) -Controlled - LDL improved from 155 to 65 after starting statin -Current treatment: Rosuvastatin 10 mg daily -Educated on Cholesterol goals; Benefits of statin for ASCVD risk reduction; -Recommended to continue current medication  Chronic Pain (Goal: improve function) - DJD of knee, lumbosacral spondylosis. Back surgery 2012, has been on opioids ever since -Improved - pt has completed a round of PT for her back and pain is improved; she is still using hydrocodone to manage pain -Current treatment  Hydrocodone-APAP 5-325 mg q6h PRN (#90/month) -Counseled on using lowest effective  dose of hydrocodone to manage pain  Health Maintenance -Vaccine gaps: covid booster, flu -Pt reports she had covid booster on 11/19/20. She plans to get flu shot at PCP visit in November  Patient Goals/Self-Care Activities Patient will:  - take medications as prescribed -Get flu shot at PCP visit in November -Contact PCP and cardiology office for Eliquis samples 1-2 weeks before running out       Patient verbalizes understanding of instructions provided today and agrees to view in [place].  Telephone follow up appointment with pharmacy team member scheduled for: 6 months  Charlene Brooke, PharmD, [place], CPP Clinical Pharmacist [facility] at [facility] [phone]

## 2021-03-20 NOTE — Progress Notes (Signed)
Chronic Care Management Pharmacy Note  03/20/2021 Name:  Claudia [name] MRN:  [id number] DOB:  [birth year]  Summary: -Pt has enough Eliquis samples from cardiology to last until end October. She will need more to get her through the end of the year. -Pt had 4th Covid vaccine 11/19/20 @ [facility]  Recommendations/Changes made from today's visit: -Advised pt to contact cardiology and PCP for Eliquis samples 1-2 weeks prior to running out -Advised to get flu vaccine at PCP visit in November    Subjective: Claudia [name] is an 87 y.o. year old female who is a primary patient of Janith Lima, MD.  The CCM team was consulted for assistance with disease management and care coordination needs.    Engaged with patient by telephone for follow up visit in response to provider referral for pharmacy case management and/or care coordination services.   Consent to Services:  The patient was given information about Chronic Care Management services, agreed to services, and gave verbal consent prior to initiation of services.  Please see initial visit note for detailed documentation.   Patient Care Team: Janith Lima, MD as PCP - General (Internal Medicine) Skeet Latch, MD as PCP - Cardiology (Cardiology) Phylliss Bob, MD as Consulting Physician (Orthopedic Surgery) Alanda Slim Neena Rhymes, MD as Consulting Physician (Ophthalmology) Charlton Haws, [facility] as Pharmacist (Pharmacist)   Recent office visits: 11/03/20 Dr Ronnald Ramp OV: chronic f/u; labs stable; no med changes  05/05/20 Dr Ronnald Ramp OV: CPX. C/o low back pain. C/o dizziness, SBP 112 at home. Switched telmisartan/hctz to indapamide 1.25 mg  Recent consult visits: 03/09/21 Dr Fuller [place] (Cardiology-Pharm): anticoagulation plan - pt to stay on Eliquis, received samples through end October.  03/06/21 Dr Oval Linsey (cardiology): f/u Afib. Referred to pharmacy - wants to switch Eliquis to warfarin due to cost  09/25/20 Dr Georgina Snell  (sports med): referred to PT for back pain.  05/14/20 NP Roderic Palau (Afib clinic): Afib w/ RVR seen on event monitor, dx paroxysmal Afib. Stop aspirin, Start Eliquis 5 mg BID and diltiazem PRN for HR > 100 (hx bradycardia)  04/17/20 Dr Eulas Post (cardiology): c/o palpitations. Ordered event monitor. Started rosuvastatin 10 for LDL 155 (Oct 2020). Repeat lipid panel 11/21 showed LDL 65.  Hospital visits: None in previous 6 months  Objective:  Lab Results  Component Value Date   CREATININE 1.22 (H) 11/04/2020   BUN 23 11/04/2020   GFR 40.70 (L) 11/04/2020   GFRNONAA 50 (L) 06/11/2020   GFRAA >60 04/04/2017   NA 139 11/04/2020   K 4.6 11/04/2020   CALCIUM 10.0 11/04/2020   CO2 28 11/04/2020   GLUCOSE 87 11/04/2020    Lab Results  Component Value Date/Time   HGBA1C 5.5 11/03/2020 11:32 AM   HGBA1C 5.8 (H) 08/24/2015 02:05 AM   GFR 40.70 (L) 11/04/2020 01:07 PM   GFR 39.29 (L) 05/05/2020 02:12 PM    Last diabetic Eye exam: No results found for: HMDIABEYEEXA  Last diabetic Foot exam: No results found for: HMDIABFOOTEX   Lab Results  Component Value Date   CHOL 138 05/05/2020   HDL 52.40 05/05/2020   LDLCALC 65 05/05/2020   LDLDIRECT 158.9 09/05/2012   TRIG 104.0 05/05/2020   CHOLHDL 3 05/05/2020    Hepatic Function Latest Ref Rng & Units 05/05/2020 02/20/2018 04/04/2017  Total Protein 6.0 - 8.3 g/dL 6.7 7.0 6.8  Albumin 3.5 - 5.2 g/dL 4.1 4.0 3.9  AST 0 - 37 U/L $Remo'15 12 18  'DdGGa$ ALT 0 - 35  U/L 9 8 11(L)  Alk Phosphatase 39 - 117 U/L 65 78 85  Total Bilirubin 0.2 - 1.2 mg/dL 0.7 0.9 0.9  Bilirubin, Direct 0.0 - 0.3 mg/dL 0.2 - -    Lab Results  Component Value Date/Time   TSH 2.88 11/04/2020 01:07 PM   TSH 3.96 05/05/2020 02:12 PM   FREET4 1.2 03/29/2016 04:41 PM   FREET4 0.90 01/01/2016 12:16 PM    CBC Latest Ref Rng & Units 06/11/2020 05/05/2020 12/04/2018  WBC 4.0 - 10.5 K/uL 4.4 5.0 4.8  Hemoglobin 12.0 - 15.0 g/dL 12.2 12.2 12.9  Hematocrit 36.0 - 46.0 % 39.8 37.4  39.7  Platelets 150 - 400 K/uL 200 216.0 207.0    Lab Results  Component Value Date/Time   VD25OH 23.5 08/10/2011 12:00 AM    Clinical ASCVD: No  The ASCVD Risk score (Arnett DK, et al., 2019) failed to calculate for the following reasons:   The 2019 ASCVD risk score is only valid for ages 26 to 84    Depression screen PHQ 2/9 05/05/2020 06/13/2018 04/19/2017  Decreased Interest 0 0 0  Down, Depressed, Hopeless 0 0 0  PHQ - 2 Score 0 0 0  Some recent data might be hidden     CHA2DS2-VASc Score = 4  The patient's score is based upon: CHF History: 0 HTN History: 1 Diabetes History: 0 Stroke History: 0 Vascular Disease History: 0 Age Score: 2 Gender Score: 1      Social History   Tobacco Use  Smoking Status Never  Smokeless Tobacco Never   BP Readings from Last 3 Encounters:  03/06/21 124/62  11/03/20 (!) 142/70  10/23/20 139/71   Pulse Readings from Last 3 Encounters:  03/06/21 (!) 57  11/03/20 67  10/23/20 (!) 57   Wt Readings from Last 3 Encounters:  03/06/21 205 lb 8 oz (93.2 kg)  11/03/20 212 lb (96.2 kg)  10/23/20 215 lb 3.2 oz (97.6 kg)   BMI Readings from Last 3 Encounters:  03/06/21 29.49 kg/m  11/03/20 30.42 kg/m  10/23/20 30.88 kg/m    Assessment/Interventions: Review of patient past medical history, allergies, medications, health status, including review of consultants reports, laboratory and other test data, was performed as part of comprehensive evaluation and provision of chronic care management services.   SDOH:  (Social Determinants of Health) assessments and interventions performed: Yes  SDOH Screenings   Alcohol Screen: Low Risk    Last Alcohol Screening Score (AUDIT): 0  Depression (PHQ2-9): Low Risk    PHQ-2 Score: 0  Financial Resource Strain: Low Risk    Difficulty of Paying Living Expenses: Not hard at all  Food Insecurity: No Food Insecurity   Worried About Charity fundraiser in the Last Year: Never true   Ran Out of  Food in the Last Year: Never true  Housing: Low Risk    Last Housing Risk Score: 0  Physical Activity: Inactive   Days of Exercise per Week: 0 days   Minutes of Exercise per Session: 0 min  Social Connections: Moderately Integrated   Frequency of Communication with Friends and Family: More than three times a week   Frequency of Social Gatherings with Friends and Family: Never   Attends Religious Services: 1 to 4 times per year   Active Member of [employer] or Organizations: No   Attends Archivist Meetings: Never   Marital Status: Married  Stress: No Stress Concern Present   Feeling of Stress : Not at all  Tobacco Use: Low Risk    Smoking Tobacco Use: Never   Smokeless Tobacco Use: Never  Transportation Needs: No Transportation Needs   Lack of Transportation (Medical): No   Lack of Transportation (Non-Medical): No    CCM Care Plan  Allergies  Allergen Reactions   Lipitor [Atorvastatin] Other (See Comments)    Muscle aches   Trileptal [Oxcarbazepine] Other (See Comments)    confusion    Medications Reviewed Today     Reviewed by Charlton Haws, [facility] (Pharmacist) on 03/20/21 at 1412  Med List Status: <None>   Medication Order Taking? Sig Documenting Provider Last Dose Status Informant  apixaban (ELIQUIS) 5 MG TABS tablet [id number] Yes Take 1 tablet (5 mg total) by mouth 2 (two) times daily. Janith Lima, MD Taking Active   cetirizine (ZYRTEC) 10 MG tablet [id number] Yes Take 1 tablet (10 mg total) by mouth daily. Janith Lima, MD Taking Active Self  diclofenac Sodium (VOLTAREN) 1 % GEL [id number] Yes Apply topically 4 (four) times daily. [provider] Taking Active   diltiazem (CARDIZEM) 30 MG tablet [id number] Yes TAKE 1 TABLET BY MOUTH EVERY 4 HOURS AS NEEDED FOR AFIB HEART RATE GREATER THAN 100 AS LONG AS BLOOD PRESSURE LESS THAN 100 Sherran Needs, NP Taking Active   HYDROcodone-acetaminophen (NORCO/VICODIN) 5-325 MG tablet [id number] Yes Take ONE  tablet by MOUTH every EIGHT hours BEFORE meals Janith Lima, MD Taking Active   indapamide (LOZOL) 1.25 MG tablet [id number] Yes Take 1 tablet (1.25 mg total) by mouth daily. Janith Lima, MD Taking Active   linaclotide [facility]) 290 MCG CAPS capsule [id number] Yes Take 1 capsule (290 mcg total) by mouth daily before breakfast. Janith Lima, MD Taking Active   rosuvastatin (CRESTOR) 10 MG tablet [id number] Yes Take 1 tablet (10 mg total) by mouth daily. Janith Lima, MD Taking Active   vitamin C (ASCORBIC ACID) 500 MG tablet [id number] Yes Take 500 mg by mouth daily. [provider] Taking Active Self  vitamin E 180 MG (400 UNITS) capsule [id number] Yes Take 400 Units by mouth daily. [provider] Taking Active             Patient Active Problem List   Diagnosis Date Noted   PAF (paroxysmal atrial fibrillation) ([place]) 11/03/2020   Stage 3b chronic kidney disease ([place]) 05/06/2020   Chronic left-sided low back pain without sciatica 12/25/2018   Spinal stenosis in cervical region 05/12/2016   Bradycardia 01/01/2016   Therapeutic opioid-induced constipation (OIC) 01/01/2016   Chronic idiopathic constipation 09/11/2013   Lumbosacral spondylosis without myelopathy 04/09/2013   Postlaminectomy syndrome, lumbar region 04/09/2013   Anticoagulation management encounter 02/01/2012   Neuropathy, peripheral 08/04/2011   Pure hypercholesterolemia 08/04/2011   Essential hypertension, benign 08/04/2011   Hypothyroidism 08/04/2011   DJD (degenerative joint disease) of knee 08/04/2011   Gout 08/04/2011    Immunization History  Administered Date(s) Administered   DTaP 01/01/2008   Fluad Quad(high Dose 65+) 03/30/2019   Influenza Split 04/06/2012   Influenza Whole 05/04/2011   Influenza, High Dose Seasonal PF 04/17/2013, 03/04/2016, 04/19/2017, 04/07/2018   Influenza,inj,Quad PF,6+ Mos 04/11/2014, 04/14/2015   Influenza-Unspecified 04/15/2020   PFIZER(Purple  Top)SARS-COV-2 Vaccination 07/30/2019, 08/25/2019, 03/15/2020, 11/19/2020   Pneumococcal Conjugate-13 04/02/2008, 10/08/2013   Pneumococcal Polysaccharide-23 09/02/2015, 11/03/2020   Tdap 01/01/2008, 08/23/2017   Zoster Recombinat (Shingrix) 01/03/2017, 04/02/2017   Zoster, Live 08/03/2006    Conditions to be addressed/monitored:  Hypertension, Hyperlipidemia, Atrial Fibrillation, Chronic Kidney Disease and Osteoarthritis  Care Plan : [place]  Updates made by Charlton Haws, RPH since 03/20/2021 12:00 AM     Problem: Hypertension, Hyperlipidemia, Atrial Fibrillation, Chronic Kidney Disease and Osteoarthritis   Priority: High     Long-Range Goal: Disease management   Start Date: 09/16/2020  Expected End Date: 03/19/2021  This Visit's Progress: On track  Recent Progress: On track  Priority: High  Note:   Current Barriers:  Unable to independently monitor therapeutic efficacy  Pharmacist Clinical Goal(s):  Patient will achieve adherence to monitoring guidelines and medication adherence to achieve therapeutic efficacy  Interventions: 1:1 collaboration with Janith Lima, MD regarding development and update of comprehensive plan of care as evidenced by provider attestation and co-signature Inter-disciplinary care team collaboration (see longitudinal plan of care) Comprehensive medication review performed; medication list updated in electronic medical record  Hypertension / CKD stage 3a (BP goal <130/80) -Controlled - home BP is at goal -Current treatment: Indapamide 1.25 mg daily -Medications previously tried: telmisartan/HCTZ  -Current home readings: 126/68 -Current exercise habits: limited due to back pain -Denies hypotensive/hypertensive symptoms -Educated on BP goals and benefits of medications for prevention of heart attack, stroke and kidney damage; -Counseled to monitor BP at home weekly -Recommended to continue current medication and stay  hydrated  Atrial Fibrillation (Goal: prevent stroke and major bleeding) -Controlled - pt recently saw cardiology and will continue Eliquis; she has samples to get her through end of October; she will try to get more samples to get her through the end of the year at that time -New onset 04/2020. Low Afib burden per monitor. -CHADSVASC: 4 -Current treatment: Rate control: Diltiazem 30 mg q4h PRN HR > 100 Anticoagulation: Eliquis 5 mg BID -Counseled Eliquis samples are not always available, pt can call PCP and cardiology office 1-2 weeks before she needs more -Recommended to continue current medication  Hyperlipidemia: (LDL goal < 100) -Controlled - LDL improved from 155 to 65 after starting statin -Current treatment: Rosuvastatin 10 mg daily -Educated on Cholesterol goals; Benefits of statin for ASCVD risk reduction; -Recommended to continue current medication  Chronic Pain (Goal: improve function) - DJD of knee, lumbosacral spondylosis. Back surgery 2012, has been on opioids ever since -Improved - pt has completed a round of PT for her back and pain is improved; she is still using hydrocodone to manage pain -Current treatment  Hydrocodone-APAP 5-325 mg q6h PRN (#90/month) -Counseled on using lowest effective dose of hydrocodone to manage pain  Health Maintenance -Vaccine gaps: covid booster, flu -Pt reports she had covid booster on 11/19/20. She plans to get flu shot at PCP visit in November  Patient Goals/Self-Care Activities Patient will:  - take medications as prescribed -Get flu shot at PCP visit in November -Contact PCP and cardiology office for Eliquis samples 1-2 weeks before running out        Compliance/Adherence/Medication fill history: Care Gaps: None  Star-Rating Drugs: Rosuvastatin - LF 03/19/21 x 30 ds  Medication Assistance: None required.  Patient affirms current coverage meets needs.  Patient's preferred pharmacy is:  [facility] - [place], [name]  - [address] [name] [address] Phone: [phone] Fax: [phone]  [id number] - [place], [place] - [address] [name] [address] Phone: [phone] Fax: [phone]  Uses pill box? No - keeps bottles Pt endorses 100% compliance  We discussed: Reviewed  patient's [facility] medication and Epic medication profile assuring there are no discrepancies or gaps in therapy. Confirmed all fill dates appropriate and verified with patient that there is a sufficient quantity of all prescribed medications at home. Informed patient to call me any time if needing medications before scheduled deliveries.   Patient decided to: Utilize [facility] pharmacy for medication synchronization, packaging and delivery  Care Plan and Follow Up Patient Decision:  Patient agrees to Care Plan and Follow-up.  Plan: Telephone follow up appointment with care management team member scheduled for:  6 months  Charlene Brooke, PharmD, [facility] Clinical Pharmacist [facility] at [facility] [phone]

## 2021-03-27 DIAGNOSIS — M47817 Spondylosis without myelopathy or radiculopathy, lumbosacral region: Secondary | ICD-10-CM

## 2021-03-27 DIAGNOSIS — I480 Paroxysmal atrial fibrillation: Secondary | ICD-10-CM

## 2021-03-27 DIAGNOSIS — N1831 Chronic kidney disease, stage 3a: Secondary | ICD-10-CM

## 2021-03-27 DIAGNOSIS — E7800 Pure hypercholesterolemia, unspecified: Secondary | ICD-10-CM

## 2021-03-27 DIAGNOSIS — I10 Essential (primary) hypertension: Secondary | ICD-10-CM | POA: Diagnosis not present

## 2021-03-30 ENCOUNTER — Telehealth (HOSPITAL_BASED_OUTPATIENT_CLINIC_OR_DEPARTMENT_OTHER): Payer: Self-pay | Admitting: *Deleted

## 2021-03-30 NOTE — Telephone Encounter (Signed)
Patient assistance papers faxed to Claudia [name] for Eliquis Patient had not met out of deductible and had $222.46 remaining Left message to call back to let her  know and advised she can just send directly to [facility], fax [phone] Phone [phone]

## 2021-03-31 NOTE — Telephone Encounter (Signed)
Called pt and read the note from [place]. Pt states she received a letter from [place] as well. She plans to resubmit the forms. She thanked me to calling her back.

## 2021-03-31 NOTE — Telephone Encounter (Signed)
Marybel is returning Melinda's call. Please advise.

## 2021-04-14 ENCOUNTER — Other Ambulatory Visit: Payer: Self-pay | Admitting: Internal Medicine

## 2021-04-14 DIAGNOSIS — M47817 Spondylosis without myelopathy or radiculopathy, lumbosacral region: Secondary | ICD-10-CM

## 2021-04-14 DIAGNOSIS — M170 Bilateral primary osteoarthritis of knee: Secondary | ICD-10-CM

## 2021-04-14 DIAGNOSIS — M961 Postlaminectomy syndrome, not elsewhere classified: Secondary | ICD-10-CM

## 2021-04-17 ENCOUNTER — Telehealth: Payer: Self-pay | Admitting: Pharmacist

## 2021-04-17 NOTE — Progress Notes (Signed)
    Chronic Care Management Pharmacy Assistant   Name: Claudia [name]  MRN: [id number] DOB: [birth year]   Reason for Encounter: Medication Review    Recent office visits:  None ID  Recent consult visits:  None ID  Hospital visits:  None in previous 6 months  Medications: Outpatient Encounter Medications as of 04/17/2021  Medication Sig   apixaban (ELIQUIS) 5 MG TABS tablet Take 1 tablet (5 mg total) by mouth 2 (two) times daily.   cetirizine (ZYRTEC) 10 MG tablet Take 1 tablet (10 mg total) by mouth daily.   diclofenac Sodium (VOLTAREN) 1 % GEL Apply topically 4 (four) times daily.   diltiazem (CARDIZEM) 30 MG tablet TAKE 1 TABLET BY MOUTH EVERY 4 HOURS AS NEEDED FOR AFIB HEART RATE GREATER THAN 100 AS LONG AS BLOOD PRESSURE LESS THAN 100   HYDROcodone-acetaminophen (NORCO/VICODIN) 5-325 MG tablet TAKE ONE TABLET BY MOUTH EVERY 8 HOURS, take BEFORE A meal   indapamide (LOZOL) 1.25 MG tablet Take 1 tablet (1.25 mg total) by mouth daily.   linaclotide (LINZESS) 290 MCG CAPS capsule Take 1 capsule (290 mcg total) by mouth daily before breakfast.   rosuvastatin (CRESTOR) 10 MG tablet Take 1 tablet (10 mg total) by mouth daily.   vitamin C (ASCORBIC ACID) 500 MG tablet Take 500 mg by mouth daily.   vitamin E 180 MG (400 UNITS) capsule Take 400 Units by mouth daily.   No facility-administered encounter medications on file as of 04/17/2021.   BP Readings from Last 3 Encounters:  03/06/21 124/62  11/03/20 (!) 142/70  10/23/20 139/71    Lab Results  Component Value Date   HGBA1C 5.5 11/03/2020      Last adherence delivery date:03/19/21      Patient is due for next adherence delivery on: 04/27/21  Spoke with patient on 04/17/21 reviewed medications and coordinated delivery.  This delivery to include: Vials  30 Days  Rosuvastatin 10 mg 1 tab daily Hydrocodone 5-325 mg 1 tab every 8 hours   Patient declined the following medications this month:  Any concerns about your  medications? Yes  How often do you forget or accidentally miss a dose? Never  Do you use a pillbox? No  Is patient in packaging No    No refill request needed.  Confirmed delivery date of 04/27/21, advised patient that pharmacy will contact them the morning of delivery.   Annual wellness visit in last year? No Most Recent BP reading:124/62 on 9/9/22130/72   Ethelene Hal Clinical Pharmacist Assistant [phone]

## 2021-04-17 NOTE — Progress Notes (Signed)
    Chronic Care Management Pharmacy Assistant   Name: Claudia [name]  MRN: [id number] DOB: [birth year]   Medications: Outpatient Encounter Medications as of 04/17/2021  Medication Sig   apixaban (ELIQUIS) 5 MG TABS tablet Take 1 tablet (5 mg total) by mouth 2 (two) times daily.   cetirizine (ZYRTEC) 10 MG tablet Take 1 tablet (10 mg total) by mouth daily.   diclofenac Sodium (VOLTAREN) 1 % GEL Apply topically 4 (four) times daily.   diltiazem (CARDIZEM) 30 MG tablet TAKE 1 TABLET BY MOUTH EVERY 4 HOURS AS NEEDED FOR AFIB HEART RATE GREATER THAN 100 AS LONG AS BLOOD PRESSURE LESS THAN 100   HYDROcodone-acetaminophen (NORCO/VICODIN) 5-325 MG tablet TAKE ONE TABLET BY MOUTH EVERY 8 HOURS, take BEFORE A meal   indapamide (LOZOL) 1.25 MG tablet Take 1 tablet (1.25 mg total) by mouth daily.   linaclotide (LINZESS) 290 MCG CAPS capsule Take 1 capsule (290 mcg total) by mouth daily before breakfast.   rosuvastatin (CRESTOR) 10 MG tablet Take 1 tablet (10 mg total) by mouth daily.   vitamin C (ASCORBIC ACID) 500 MG tablet Take 500 mg by mouth daily.   vitamin E 180 MG (400 UNITS) capsule Take 400 Units by mouth daily.   No facility-administered encounter medications on file as of 04/17/2021.   Received call from patient regarding medication management via [facility].  Patient requested an acute fill for Eliquis to be delivered: 04/20/21 Pharmacy needs refills? No  Confirmed delivery date of 04/20/21, advised patient that pharmacy will contact them the morning of delivery.  [name]

## 2021-05-05 ENCOUNTER — Other Ambulatory Visit: Payer: Self-pay

## 2021-05-05 ENCOUNTER — Ambulatory Visit (INDEPENDENT_AMBULATORY_CARE_PROVIDER_SITE_OTHER): Payer: PPO | Admitting: Internal Medicine

## 2021-05-05 ENCOUNTER — Encounter: Payer: Self-pay | Admitting: Internal Medicine

## 2021-05-05 VITALS — BP 130/76 | HR 61 | Temp 98.2°F | Resp 16 | Ht 70.0 in | Wt 205.0 lb

## 2021-05-05 DIAGNOSIS — E039 Hypothyroidism, unspecified: Secondary | ICD-10-CM

## 2021-05-05 DIAGNOSIS — G8929 Other chronic pain: Secondary | ICD-10-CM

## 2021-05-05 DIAGNOSIS — I480 Paroxysmal atrial fibrillation: Secondary | ICD-10-CM | POA: Diagnosis not present

## 2021-05-05 DIAGNOSIS — Z0000 Encounter for general adult medical examination without abnormal findings: Secondary | ICD-10-CM | POA: Diagnosis not present

## 2021-05-05 DIAGNOSIS — N1832 Chronic kidney disease, stage 3b: Secondary | ICD-10-CM

## 2021-05-05 DIAGNOSIS — I209 Angina pectoris, unspecified: Secondary | ICD-10-CM | POA: Diagnosis not present

## 2021-05-05 DIAGNOSIS — Z23 Encounter for immunization: Secondary | ICD-10-CM | POA: Diagnosis not present

## 2021-05-05 DIAGNOSIS — M961 Postlaminectomy syndrome, not elsewhere classified: Secondary | ICD-10-CM | POA: Diagnosis not present

## 2021-05-05 DIAGNOSIS — M545 Low back pain, unspecified: Secondary | ICD-10-CM | POA: Diagnosis not present

## 2021-05-05 DIAGNOSIS — Z0001 Encounter for general adult medical examination with abnormal findings: Secondary | ICD-10-CM | POA: Insufficient documentation

## 2021-05-05 DIAGNOSIS — E7800 Pure hypercholesterolemia, unspecified: Secondary | ICD-10-CM

## 2021-05-05 DIAGNOSIS — I10 Essential (primary) hypertension: Secondary | ICD-10-CM | POA: Diagnosis not present

## 2021-05-05 DIAGNOSIS — M47817 Spondylosis without myelopathy or radiculopathy, lumbosacral region: Secondary | ICD-10-CM

## 2021-05-05 DIAGNOSIS — Z01818 Encounter for other preprocedural examination: Secondary | ICD-10-CM | POA: Insufficient documentation

## 2021-05-05 LAB — CBC WITH DIFFERENTIAL/PLATELET
Basophils Absolute: 0 10*3/uL (ref 0.0–0.1)
Basophils Relative: 1.2 % (ref 0.0–3.0)
Eosinophils Absolute: 0.2 10*3/uL (ref 0.0–0.7)
Eosinophils Relative: 5.3 % — ABNORMAL HIGH (ref 0.0–5.0)
HCT: 37.4 % (ref 36.0–46.0)
Hemoglobin: 12.2 g/dL (ref 12.0–15.0)
Lymphocytes Relative: 33.1 % (ref 12.0–46.0)
Lymphs Abs: 1.4 10*3/uL (ref 0.7–4.0)
MCHC: 32.6 g/dL (ref 30.0–36.0)
MCV: 86.6 fl (ref 78.0–100.0)
Monocytes Absolute: 0.5 10*3/uL (ref 0.1–1.0)
Monocytes Relative: 11.2 % (ref 3.0–12.0)
Neutro Abs: 2 10*3/uL (ref 1.4–7.7)
Neutrophils Relative %: 49.2 % (ref 43.0–77.0)
Platelets: 177 10*3/uL (ref 150.0–400.0)
RBC: 4.32 Mil/uL (ref 3.87–5.11)
RDW: 13.8 % (ref 11.5–15.5)
WBC: 4.1 10*3/uL (ref 4.0–10.5)

## 2021-05-05 LAB — BASIC METABOLIC PANEL
BUN: 23 mg/dL (ref 6–23)
CO2: 29 mEq/L (ref 19–32)
Calcium: 10 mg/dL (ref 8.4–10.5)
Chloride: 104 mEq/L (ref 96–112)
Creatinine, Ser: 1.05 mg/dL (ref 0.40–1.20)
GFR: 48.56 mL/min — ABNORMAL LOW (ref >60.00)
Glucose, Bld: 110 mg/dL — ABNORMAL HIGH (ref 70–99)
Potassium: 4.2 mEq/L (ref 3.5–5.1)
Sodium: 140 mEq/L (ref 135–145)

## 2021-05-05 LAB — HEPATIC FUNCTION PANEL
ALT: 10 U/L (ref 0–35)
AST: 18 U/L (ref 0–37)
Albumin: 4.1 g/dL (ref 3.5–5.2)
Alkaline Phosphatase: 58 U/L (ref 39–117)
Bilirubin, Direct: 0.2 mg/dL (ref 0.0–0.3)
Total Bilirubin: 0.9 mg/dL (ref 0.2–1.2)
Total Protein: 6.7 g/dL (ref 6.0–8.3)

## 2021-05-05 LAB — LIPID PANEL
Cholesterol: 148 mg/dL (ref 0–200)
HDL: 54.4 mg/dL (ref >39.00)
LDL Cholesterol: 77 mg/dL (ref 0–99)
NonHDL: 93.55
Total CHOL/HDL Ratio: 3
Triglycerides: 85 mg/dL (ref 0.0–149.0)
VLDL: 17 mg/dL (ref 0.0–40.0)

## 2021-05-05 LAB — TSH: TSH: 4.92 u[IU]/mL (ref 0.35–5.50)

## 2021-05-05 NOTE — Progress Notes (Signed)
Subjective:  Patient ID: Claudia [name], female    DOB: 1934-06-05  Age: 87 y.o. MRN: [id number]  CC: Annual Exam, Atrial Fibrillation, Hypothyroidism, Osteoarthritis, and Back Pain  This visit occurred during the SARS-CoV-2 public health emergency.  Safety protocols were in place, including screening questions prior to the visit, additional usage of staff PPE, and extensive cleaning of exam room while observing appropriate contact time as indicated for disinfecting solutions.    HPI LIBBY GOEHRING presents for a CPX and f/up -   She is active and denies CP/DOE/palpitations/edema/fatigue.  Outpatient Medications Prior to Visit  Medication Sig Dispense Refill   apixaban (ELIQUIS) 5 MG TABS tablet Take 1 tablet (5 mg total) by mouth 2 (two) times daily. 180 tablet 1   cetirizine (ZYRTEC) 10 MG tablet Take 1 tablet (10 mg total) by mouth daily. 90 tablet 1   diclofenac Sodium (VOLTAREN) 1 % GEL Apply topically 4 (four) times daily.     diltiazem (CARDIZEM) 30 MG tablet TAKE 1 TABLET BY MOUTH EVERY 4 HOURS AS NEEDED FOR AFIB HEART RATE GREATER THAN 100 AS LONG AS BLOOD PRESSURE LESS THAN 100 60 tablet 1   HYDROcodone-acetaminophen (NORCO/VICODIN) 5-325 MG tablet TAKE ONE TABLET BY MOUTH EVERY 8 HOURS, take BEFORE A meal 90 tablet 0   indapamide (LOZOL) 1.25 MG tablet Take 1 tablet (1.25 mg total) by mouth daily. 90 tablet 1   linaclotide (LINZESS) 290 MCG CAPS capsule Take 1 capsule (290 mcg total) by mouth daily before breakfast. 90 capsule 1   rosuvastatin (CRESTOR) 10 MG tablet Take 1 tablet (10 mg total) by mouth daily. 90 tablet 1   vitamin C (ASCORBIC ACID) 500 MG tablet Take 500 mg by mouth daily.     vitamin E 180 MG (400 UNITS) capsule Take 400 Units by mouth daily.     No facility-administered medications prior to visit.    ROS Review of Systems  Constitutional:  Negative for chills, diaphoresis, fatigue and fever.  HENT: Negative.  Negative for trouble swallowing.    Eyes: Negative.   Respiratory:  Negative for cough, chest tightness, shortness of breath and wheezing.   Cardiovascular:  Negative for chest pain, palpitations and leg swelling.  Gastrointestinal:  Negative for abdominal pain, constipation, diarrhea, nausea and vomiting.  Endocrine: Negative.   Genitourinary: Negative.  Negative for difficulty urinating and dysuria.  Musculoskeletal:  Positive for arthralgias and back pain. Negative for myalgias and neck pain.  Skin: Negative.  Negative for color change and pallor.  Neurological: Negative.  Negative for dizziness and weakness.  Hematological:  Negative for adenopathy. Does not bruise/bleed easily.  Psychiatric/Behavioral: Negative.     Objective:  BP 130/76 (BP Location: Right Arm, Patient Position: Sitting, Cuff Size: Large)   Pulse 61   Temp 98.2 F (36.8 C) (Oral)   Resp 16   Ht 5\' 10"  (1.778 m)   Wt 205 lb (93 kg)   SpO2 97%   BMI 29.41 kg/m   BP Readings from Last 3 Encounters:  05/05/21 130/76  03/06/21 124/62  11/03/20 (!) 142/70    Wt Readings from Last 3 Encounters:  05/05/21 205 lb (93 kg)  03/06/21 205 lb 8 oz (93.2 kg)  11/03/20 212 lb (96.2 kg)    Physical Exam Vitals reviewed.  HENT:     Nose: Nose normal.     Mouth/Throat:     Mouth: Mucous membranes are moist.  Eyes:     General: No scleral icterus.  Conjunctiva/sclera: Conjunctivae normal.  Cardiovascular:     Rate and Rhythm: Normal rate and regular rhythm.     Heart sounds: No murmur heard. Pulmonary:     Effort: Pulmonary effort is normal.     Breath sounds: No stridor. No wheezing, rhonchi or rales.  Abdominal:     General: Abdomen is flat.     Palpations: There is no mass.     Tenderness: There is no abdominal tenderness. There is no guarding.     Hernia: No hernia is present.  Musculoskeletal:        General: Normal range of motion.     Cervical back: Neck supple.     Right lower leg: No edema.     Left lower leg: No edema.   Lymphadenopathy:     Cervical: No cervical adenopathy.  Skin:    General: Skin is warm and dry.  Neurological:     General: No focal deficit present.     Mental Status: She is alert.  Psychiatric:        Mood and Affect: Mood normal.        Behavior: Behavior normal.    Lab Results  Component Value Date   WBC 4.1 05/05/2021   HGB 12.2 05/05/2021   HCT 37.4 05/05/2021   PLT 177.0 05/05/2021   GLUCOSE 110 (H) 05/05/2021   CHOL 148 05/05/2021   TRIG 85.0 05/05/2021   HDL 54.40 05/05/2021   LDLDIRECT 158.9 09/05/2012   LDLCALC 77 05/05/2021   ALT 10 05/05/2021   AST 18 05/05/2021   NA 140 05/05/2021   K 4.2 05/05/2021   CL 104 05/05/2021   CREATININE 1.05 05/05/2021   BUN 23 05/05/2021   CO2 29 05/05/2021   TSH 4.92 05/05/2021   INR 1.00 04/04/2017   HGBA1C 5.5 11/03/2020    No results found.  Assessment & Plan:   Ataya was seen today for annual exam, atrial fibrillation, hypothyroidism, osteoarthritis and back pain.  Diagnoses and all orders for this visit:  PAF (paroxysmal atrial fibrillation) ([place])- She has good rate and rhythm control.  Will continue anticoagulation with the DOAC. -     TSH; Future -     TSH  Angina pectoris (HCC)  Essential hypertension, benign- Her blood pressure is adequately well controlled. -     CBC with Differential/Platelet; Future -     TSH; Future -     TSH -     CBC with Differential/Platelet  Acquired hypothyroidism- Her TSH is in the normal range.  Thyroid replacement therapy is not indicated.  Stage 3b chronic kidney disease ([place])- Her renal function is stable.  She will avoid nephrotoxic agents. -     Basic metabolic panel; Future -     Basic metabolic panel  Encounter for general adult medical examination with abnormal findings- Exam completed, labs reviewed, vaccines reviewed and updated, no cancer screenings indicated, patient education was given.  Flu vaccine need -     Flu Vaccine QUAD High  Dose(Fluad)  Lumbosacral spondylosis without myelopathy -     Ambulatory referral to Orthopedic Surgery  Chronic left-sided low back pain without sciatica -     Ambulatory referral to Orthopedic Surgery  Postlaminectomy syndrome, lumbar region -     Ambulatory referral to Orthopedic Surgery  Pure hypercholesterolemia- LDL goal achieved. Doing well on the statin  -     Lipid panel; Future -     Hepatic function panel; Future -     Hepatic  function panel -     Lipid panel  I am having Kaelee Pfeffer. Lanzo maintain her vitamin C, cetirizine, vitamin E, diltiazem, diclofenac Sodium, indapamide, linaclotide, rosuvastatin, Eliquis, and HYDROcodone-acetaminophen.  No orders of the defined types were placed in this encounter.    Follow-up: Return in about 6 months (around 11/02/2021).  [name], MD

## 2021-05-05 NOTE — Patient Instructions (Signed)

## 2021-05-18 ENCOUNTER — Ambulatory Visit (INDEPENDENT_AMBULATORY_CARE_PROVIDER_SITE_OTHER): Payer: PPO | Admitting: Orthopedic Surgery

## 2021-05-18 ENCOUNTER — Other Ambulatory Visit: Payer: Self-pay

## 2021-05-18 ENCOUNTER — Telehealth: Payer: Self-pay | Admitting: Cardiovascular Disease

## 2021-05-18 ENCOUNTER — Ambulatory Visit (INDEPENDENT_AMBULATORY_CARE_PROVIDER_SITE_OTHER): Payer: PPO

## 2021-05-18 ENCOUNTER — Telehealth: Payer: Self-pay

## 2021-05-18 DIAGNOSIS — M5442 Lumbago with sciatica, left side: Secondary | ICD-10-CM | POA: Diagnosis not present

## 2021-05-18 DIAGNOSIS — M5441 Lumbago with sciatica, right side: Secondary | ICD-10-CM

## 2021-05-18 DIAGNOSIS — G8929 Other chronic pain: Secondary | ICD-10-CM

## 2021-05-18 MED ORDER — GABAPENTIN 100 MG PO CAPS: 100.0000 mg | ORAL_CAPSULE | Freq: Three times a day (TID) | ORAL | 3 refills | Status: DC

## 2021-05-18 NOTE — Telephone Encounter (Signed)
Pt called and would like to know if its okay for her to take gabapentin and hydrocodone together ?  Her PCP proscribed the hydro

## 2021-05-18 NOTE — Telephone Encounter (Signed)
Pt c/o medication issue:  1. Name of Medication: apixaban (ELIQUIS) 5 MG TABS tablet  2. How are you currently taking this medication (dosage and times per day)? Take 1 tablet (5 mg total) by mouth 2 (two) times daily.  3. Are you having a reaction (difficulty breathing--STAT)? No   4. What is your medication issue? According to Dr. Sharol Given, medication is making patient lightheaded and losing her balance.

## 2021-05-18 NOTE — Telephone Encounter (Signed)
Pt takes hydrocodone 5-325mg  1 tab q8h Was Rx'd gabapentin 100mg  TID today.

## 2021-05-18 NOTE — Telephone Encounter (Signed)
error 

## 2021-05-18 NOTE — Telephone Encounter (Signed)
Pt c/o medication issue:   1. Name of Medication: apixaban (ELIQUIS) 5 MG TABS tablet   2. How are you currently taking this medication (dosage and times per day)? Take 1 tablet (5 mg total) by mouth 2 (two) times daily.   3. Are you having a reaction (difficulty breathing--STAT)? No    4. What is your medication issue? According to Dr. Sharol Given, medication is making patient lightheaded and losing her balance.

## 2021-05-18 NOTE — Telephone Encounter (Signed)
Spoke with patient of Dr. Oval Linsey who said Dr. Sharol Given told her that Eliquis could be making her lightheaded/losing her balance. She reports symptoms x1 month. She said Dr. Sharol Given told her she may need to have her BP checked when seated and standing. She does not check BP at home and Dr. Sharol Given did not check today. She reports her symptoms start when changing position and with movement. She has no reported bleeding issues on Eliquis. Advised she would need an appt for eval - scheduled with Jess NP on 11/23 at [facility]

## 2021-05-19 ENCOUNTER — Other Ambulatory Visit: Payer: Self-pay | Admitting: Internal Medicine

## 2021-05-19 DIAGNOSIS — M170 Bilateral primary osteoarthritis of knee: Secondary | ICD-10-CM

## 2021-05-19 DIAGNOSIS — M47817 Spondylosis without myelopathy or radiculopathy, lumbosacral region: Secondary | ICD-10-CM

## 2021-05-19 DIAGNOSIS — M961 Postlaminectomy syndrome, not elsewhere classified: Secondary | ICD-10-CM

## 2021-05-19 MED ORDER — HYDROCODONE-ACETAMINOPHEN 5-325 MG PO TABS: ORAL_TABLET | ORAL | 0 refills | Status: DC

## 2021-05-19 NOTE — Telephone Encounter (Signed)
This pt was in the office yesterday f/u LBP please see message below and advise.

## 2021-05-19 NOTE — Telephone Encounter (Signed)
Pt informed

## 2021-05-19 NOTE — Telephone Encounter (Signed)
Pt called again. Please advise below

## 2021-05-20 ENCOUNTER — Ambulatory Visit (HOSPITAL_BASED_OUTPATIENT_CLINIC_OR_DEPARTMENT_OTHER): Payer: PPO | Admitting: General Practice

## 2021-05-20 NOTE — Progress Notes (Deleted)
Cardiology Office Note:    Date:  05/20/2021   ID:  Claudia [name], DOB 07-May-1934, MRN [id number]  PCP:  Janith Lima, MD   [facility] Providers Cardiologist:  Skeet Latch, MD { Click to update primary MD,subspecialty MD or APP then REFRESH:1}    Referring MD: Janith Lima, MD   Presents to the clinic today for an evaluation of her lightheadedness  History of Present Illness:    Claudia [name] is a 87 y.o. female with a hx of essential hypertension, paroxysmal atrial fibrillation, chronic idiopathic constipation, DJD, stage III CKD, HLD, spinal stenosis, chronic low back pain, and lightheadedness.  She previously saw Dr. Carrolyn Leigh 10/17 for bradycardia at that time she was referred for echocardiogram which showed an LVEF of 55 to 60% and moderate LVH.  She was asymptomatic.  She was seen by [place] PA-C on 04/16/2020 and reported palpitations.  She wore an ambulatory cardiac monitor 06/16/2020 which showed episodes of atrial fibrillation.  She was seen in the EP clinic and started on apixaban.  She was seen by Dr. Oval Linsey on 03/06/2021 she was doing well.  She had only experienced episodes of atrial fibrillation 3 times since November.  Her blood pressure was ranging in the 885-027 systolic range.  Her insurance had instructed her to try warfarin since apixaban was too expensive.  Her husband has early stage dementia and she was caring for him.  She denies shortness of breath and chest discomfort.  She was attending the [employer] well program stopped due to insurance not paying for it.  She plan to join [employer] at [employer].  She contacted the nurse triage line on 05/18/2021.  She indicated that she had talked with Dr. Sharol Given who felt that her Eliquis could be making her lightheaded.  She reported symptoms x1 month.  He also indicated that she may need to have her blood pressure checked while she was seated and with standing.  She reported her symptoms started when she was  changing position and with movement.  She denied bleeding issues on apixaban.  She presents the clinic today for follow-up evaluation states***  *** denies chest pain, shortness of breath, lower extremity edema, fatigue, palpitations, melena, hematuria, hemoptysis, diaphoresis, weakness, presyncope, syncope, orthopnea, and PND.   Past Medical History:  Diagnosis Date   Abnormality of gait    Brachial neuritis or radiculitis NOS    Degeneration of lumbar or lumbosacral intervertebral disc    Essential hypertension, benign    Gouty arthropathy    Lumbago    Obesity    Osteoarthritis    Pure hypercholesterolemia    Unspecified hypothyroidism     Past Surgical History:  Procedure Laterality Date   ABDOMINAL HYSTERECTOMY  03/13/2010   BACK SURGERY     LUMBAR LAMINECTOMY  03/13/2010   right knee replacement  03/13/2010    Current Medications: No outpatient medications have been marked as taking for the 05/20/21 encounter (Appointment) with Deberah Pelton, NP.     Allergies:   Lipitor [atorvastatin] and Trileptal [oxcarbazepine]   Social History   Socioeconomic History   Marital status: Married    Spouse name: Not on file   Number of children: Not on file   Years of education: Not on file   Highest education level: Not on file  Occupational History   Not on file  Tobacco Use   Smoking status: Never   Smokeless tobacco: Never  Substance and Sexual Activity  Alcohol use: No    Comment: social   Drug use: No   Sexual activity: Never  Other Topics Concern   Not on file  Social History Narrative   Not on file   Social Determinants of Health   Financial Resource Strain: Not on file  Food Insecurity: Not on file  Transportation Needs: Not on file  Physical Activity: Not on file  Stress: Not on file  Social Connections: Not on file     Family History: The patient's ***family history includes Alzheimer's disease in her mother; Cirrhosis in her brother; Diabetes in  her brother, sister, sister, and sister; Heart attack in her brother and father; Heart disease in her father; Hypertension in her mother. There is no history of Cancer or Kidney disease.  ROS:   Please see the history of present illness.    *** All other systems reviewed and are negative.   Risk Assessment/Calculations:   {Does this patient have ATRIAL FIBRILLATION?:[phone]}       Physical Exam:    VS:  There were no vitals taken for this visit.    Wt Readings from Last 3 Encounters:  05/05/21 205 lb (93 kg)  03/06/21 205 lb 8 oz (93.2 kg)  11/03/20 212 lb (96.2 kg)     GEN: *** Well nourished, well developed in no acute distress HEENT: Normal NECK: No JVD; No carotid bruits LYMPHATICS: No lymphadenopathy CARDIAC: ***RRR, no murmurs, rubs, gallops RESPIRATORY:  Clear to auscultation without rales, wheezing or rhonchi  ABDOMEN: Soft, non-tender, non-distended MUSCULOSKELETAL:  No edema; No deformity  SKIN: Warm and dry NEUROLOGIC:  Alert and oriented x 3 PSYCHIATRIC:  Normal affect    EKGs/Labs/Other Studies Reviewed:    The following studies were reviewed today:  Echocardiogram 03/30/2016  Study Conclusions   - Left ventricle: The cavity size was normal. Wall thickness was    increased in a pattern of moderate LVH. There was focal basal    hypertrophy. Systolic function was normal. The estimated ejection    fraction was in the range of 55% to 60%. Wall motion was normal;    there were no regional wall motion abnormalities. Left    ventricular diastolic function parameters were normal for the    patient&'s age.  - Aortic valve: There was trivial regurgitation.  - Left atrium: The atrium was moderately dilated.  - Pulmonary arteries: Systolic pressure was mildly increased. PA    peak pressure: 31 mm Hg (S).   EKG:  EKG is *** ordered today.  The ekg ordered today demonstrates ***  Recent Labs: 05/05/2021: ALT 10; BUN 23; Creatinine, Ser 1.05; Hemoglobin 12.2;  Platelets 177.0; Potassium 4.2; Sodium 140; TSH 4.92  Recent Lipid Panel    Component Value Date/Time   CHOL 148 05/05/2021 1144   TRIG 85.0 05/05/2021 1144   HDL 54.40 05/05/2021 1144   CHOLHDL 3 05/05/2021 1144   VLDL 17.0 05/05/2021 1144   LDLCALC 77 05/05/2021 1144   LDLDIRECT 158.9 09/05/2012 1038    ASSESSMENT & PLAN    Lightheadedness-notices lightheadedness with movement such as going from the laying to sitting position and sitting to standing.  Orthostatic negative.  Reports not staying very well-hydrated. Increase p.o. hydration Change positions slowly Lower extremity support stockings-[place] support stocking sheet given  Paroxysmal atrial fibrillation-reports intermittent brief episodes of irregular heartbeat.  Denies fast and accelerated heartbeats.  Reports compliance with apixaban and denies bleeding issues. Continue diltiazem as needed, apixaban Heart healthy low-sodium diet-salty 6 given Increase physical  activity as tolerated  Essential hypertension-BP today***.  Reports systolic blood pressures in the 130-140 range at home. Continue indapamide Heart healthy low-sodium diet-salty 6 given Increase physical activity as tolerated  Disposition: Follow-up with Dr. Oval Linsey or me in 4-6 months.  {Are you ordering a CV Procedure (e.g. stress test, cath, DCCV, TEE, etc)?   Press F2        :[id number]}    Medication Adjustments/Labs and Tests Ordered: Current medicines are reviewed at length with the patient today.  Concerns regarding medicines are outlined above.  No orders of the defined types were placed in this encounter.  No orders of the defined types were placed in this encounter.   There are no Patient Instructions on file for this visit.   Signed, Deberah Pelton, NP  05/20/2021 6:29 AM      Notice: This dictation was prepared with Dragon dictation along with smaller phrase technology. Any transcriptional errors that result from this process are  unintentional and may not be corrected upon review.  I spent***minutes examining this patient, reviewing medications, and using patient centered shared decision making involving her cardiac care.  Prior to her visit I spent greater than 20 minutes reviewing her past medical history,  medications, and prior cardiac tests.

## 2021-05-25 ENCOUNTER — Other Ambulatory Visit: Payer: Self-pay

## 2021-05-25 ENCOUNTER — Encounter (HOSPITAL_BASED_OUTPATIENT_CLINIC_OR_DEPARTMENT_OTHER): Payer: Self-pay | Admitting: General Practice

## 2021-05-25 ENCOUNTER — Ambulatory Visit (HOSPITAL_BASED_OUTPATIENT_CLINIC_OR_DEPARTMENT_OTHER): Payer: PPO | Admitting: Nurse Practitioner

## 2021-05-25 VITALS — BP 142/61 | HR 63 | Ht 70.0 in | Wt 204.9 lb

## 2021-05-25 DIAGNOSIS — Z7901 Long term (current) use of anticoagulants: Secondary | ICD-10-CM | POA: Diagnosis not present

## 2021-05-25 DIAGNOSIS — I10 Essential (primary) hypertension: Secondary | ICD-10-CM | POA: Diagnosis not present

## 2021-05-25 DIAGNOSIS — I480 Paroxysmal atrial fibrillation: Secondary | ICD-10-CM

## 2021-05-25 DIAGNOSIS — R2681 Unsteadiness on feet: Secondary | ICD-10-CM

## 2021-05-25 NOTE — Patient Instructions (Signed)
Medication Instructions:  Your Physician recommend you continue on your current medication as directed.    May consider switching from Eliquis to [place]. Will send to our pharmacy team for review of cost for your insurance.   *If you need a refill on your cardiac medications before your next appointment, please call your pharmacy*   Lab Work: None ordered today   Testing/Procedures: None ordered today    Follow-Up: At [facility], you and your health needs are our priority.  As part of our continuing mission to provide you with exceptional heart care, we have created designated Provider Care Teams.  These Care Teams include your primary Cardiologist (physician) and Advanced Practice Providers (APPs -  Physician Assistants and Nurse Practitioners) who all work together to provide you with the care you need, when you need it.  We recommend signing up for the patient portal called "MyChart".  Sign up information is provided on this After Visit Summary.  MyChart is used to connect with patients for Virtual Visits (Telemedicine).  Patients are able to view lab/test results, encounter notes, upcoming appointments, etc.  Non-urgent messages can be sent to your provider as well.   To learn more about what you can do with MyChart, go to [email or web address].    Your next appointment:   6 month(s)  The format for your next appointment:   In Person  Provider:   Skeet Latch, MD, Laurann Montana, NP, or Coletta Memos, NP

## 2021-05-25 NOTE — Progress Notes (Signed)
Cardiology Office Note:    Date:  05/25/2021   ID:  TARENA GOCKLEY, DOB October 10, 1933, MRN [id number]  PCP:  Janith Lima, MD   [facility] Providers Cardiologist:  Skeet Latch, MD     Referring MD: Janith Lima, MD   Chief Complaint: dizziness  History of Present Illness:    Claudia [name] is a 87 y.o. female with a hx of HTN, PAF, chronic anticoagulation, hypothyroidism, obesity, degenerative disc disease, and gout. She was initially seen by Dr. Harrington Challenger in 2017 for bradycardia and echo was ordered. Echo 03/30/16 showed normal LV systolic and diastolic function and no valvular abnormalities. Sleep study was recommended but she refused. She continued to be followed with no significant findings until 2021 when she was evaluated for palpitations.  Atrial fibrillation was found on cardiac monitor and she was started on anticoagulation which she has continued. She has maintained rate control on diltiazem 30 mg prn, which she takes rarely. There is a note in her chart to avoid nodal agents due to pysch effects.   She was last seen in our office on 03/06/21 by Dr. Oval Linsey and was referred to pharmacy per patient request to switch from Eliquis to Warfarin due to cost. At the visit on 03/09/21 with the PharmD, she elected to continue Eliquis and apply for assistance. She called on 05/18/21 with concerns about orthostasis.   Today, she is here alone for evaluation of dizziness. She reports difficulty completing tasks at home without stopping to hold onto counters or walls. She denies lightheadedness, dizziness (room spinning), or syncope. Reports numbness and tingling in both legs. She denies chest pain, shortness of breath, fatigue, palpitations, melena, hematuria, hemoptysis, diaphoresis, orthopnea, and PND. She has bilateral lower extremity 1+ pitting edema that she states has not worsened recently. Had left knee replacement 12 years ago and felt it "buckle" the other day - feels it is  time to discuss with ortho. Also has concerns about her back pain from DDD. Started on gabapentin recently but reports having difficulty with unsteadiness prior to starting.   Past Medical History:  Diagnosis Date   Abnormality of gait    Brachial neuritis or radiculitis NOS    Degeneration of lumbar or lumbosacral intervertebral disc    Essential hypertension, benign    Gouty arthropathy    Lumbago    Obesity    Osteoarthritis    Pure hypercholesterolemia    Unspecified hypothyroidism     Past Surgical History:  Procedure Laterality Date   ABDOMINAL HYSTERECTOMY  03/13/2010   BACK SURGERY     LUMBAR LAMINECTOMY  03/13/2010   right knee replacement  03/13/2010    Current Medications: Current Meds  Medication Sig   apixaban (ELIQUIS) 5 MG TABS tablet Take 1 tablet (5 mg total) by mouth 2 (two) times daily.   cetirizine (ZYRTEC) 10 MG tablet Take 1 tablet (10 mg total) by mouth daily.   diclofenac Sodium (VOLTAREN) 1 % GEL Apply topically 4 (four) times daily.   diltiazem (CARDIZEM) 30 MG tablet TAKE 1 TABLET BY MOUTH EVERY 4 HOURS AS NEEDED FOR AFIB HEART RATE GREATER THAN 100 AS LONG AS BLOOD PRESSURE LESS THAN 100   gabapentin (NEURONTIN) 100 MG capsule Take 1 capsule (100 mg total) by mouth 3 (three) times daily. When necessary for neuropathy pain   HYDROcodone-acetaminophen (NORCO/VICODIN) 5-325 MG tablet TAKE ONE TABLET BY MOUTH EVERY 8 HOURS, take BEFORE A meal   indapamide (LOZOL) 1.25 MG tablet Take 1  tablet (1.25 mg total) by mouth daily.   linaclotide (LINZESS) 290 MCG CAPS capsule Take 1 capsule (290 mcg total) by mouth daily before breakfast.   rosuvastatin (CRESTOR) 10 MG tablet Take 1 tablet (10 mg total) by mouth daily.   vitamin C (ASCORBIC ACID) 500 MG tablet Take 500 mg by mouth daily.   vitamin E 180 MG (400 UNITS) capsule Take 400 Units by mouth daily.     Allergies:   Lipitor [atorvastatin] and Trileptal [oxcarbazepine]   Social History   Socioeconomic  History   Marital status: Married    Spouse name: Not on file   Number of children: Not on file   Years of education: Not on file   Highest education level: Not on file  Occupational History   Not on file  Tobacco Use   Smoking status: Never   Smokeless tobacco: Never  Substance and Sexual Activity   Alcohol use: No    Comment: social   Drug use: No   Sexual activity: Never  Other Topics Concern   Not on file  Social History Narrative   Not on file   Social Determinants of Health   Financial Resource Strain: Not on file  Food Insecurity: Not on file  Transportation Needs: Not on file  Physical Activity: Not on file  Stress: Not on file  Social Connections: Not on file     Family History: The patient's family history includes Alzheimer's disease in her mother; Cirrhosis in her brother; Diabetes in her brother, sister, sister, and sister; Heart attack in her brother and father; Heart disease in her father; Hypertension in her mother. There is no history of Cancer or Kidney disease.  ROS:   Please see the history of present illness. Bilateral lower extremity 1+ pitting edema. All other systems reviewed and are negative.  Labs/Other Studies Reviewed:    The following studies were reviewed today:  Echo 10/17  Left ventricle:  The cavity size was normal. Wall thickness was  increased in a pattern of moderate LVH. There was focal basal  hypertrophy. Systolic function was normal. The estimated ejection  fraction was in the range of 55% to 60%. Wall motion was normal;  there were no regional wall motion abnormalities. Left ventricular  diastolic function parameters were normal for the patient&'s age.  Aortic valve:   Structurally normal valve.   Cusp separation was  normal.  Doppler:  Transvalvular velocity was within the normal  range. There was no stenosis. There was trivial regurgitation.  Aorta: Aortic root: The aortic root was normal in size.  Ascending aorta: The  ascending aorta was mildly dilated.  Mitral valve:   Structurally normal valve.   Leaflet separation was  normal.  Doppler:  Transvalvular velocity was within the normal  range. There was no evidence for stenosis. There was no  regurgitation.    Peak gradient (D): 3 mm Hg.  Left atrium:  The atrium was moderately dilated.  Right ventricle:  The cavity size was normal. Systolic function was  normal.  Pulmonic valve:    The valve appears to be grossly normal.  Doppler:  There was trivial regurgitation.  Pulmonary artery:   Systolic pressure was mildly increased.  Right atrium:  The atrium was normal in size.  Pericardium: There was no pericardial effusion.  Systemic veins:  Inferior vena cava: The vessel was dilated. The respirophasic  diameter changes were blunted (< 50%), consistent with elevated  central venous pressure.   Recent Labs: 05/05/2021:  ALT 10; BUN 23; Creatinine, Ser 1.05; Hemoglobin 12.2; Platelets 177.0; Potassium 4.2; Sodium 140; TSH 4.92  Recent Lipid Panel    Component Value Date/Time   CHOL 148 05/05/2021 1144   TRIG 85.0 05/05/2021 1144   HDL 54.40 05/05/2021 1144   CHOLHDL 3 05/05/2021 1144   VLDL 17.0 05/05/2021 1144   LDLCALC 77 05/05/2021 1144   LDLDIRECT 158.9 09/05/2012 1038     Risk Assessment/Calculations:    CHA2DS2-VASc Score = 4   This indicates a 4.8% annual risk of stroke. The patient's score is based upon: CHF History: 0 HTN History: 1 Diabetes History: 0 Stroke History: 0 Vascular Disease History: 0 Age Score: 2 Gender Score: 1      Physical Exam:    VS:  BP (!) 142/61   Pulse 63   Ht 5\' 10"  (1.778 m)   Wt 204 lb 14.4 oz (92.9 kg)   BMI 29.40 kg/m     Wt Readings from Last 3 Encounters:  05/25/21 204 lb 14.4 oz (92.9 kg)  05/05/21 205 lb (93 kg)  03/06/21 205 lb 8 oz (93.2 kg)     GEN:  Well nourished, well developed in no acute distress HEENT: Normal NECK: No JVD; No carotid bruits LYMPHATICS: No  lymphadenopathy CARDIAC: RRR, no murmurs, rubs, gallops RESPIRATORY:  Clear to auscultation without rales, wheezing or rhonchi  ABDOMEN: Soft, non-tender, non-distended MUSCULOSKELETAL:  Bilateral lower extremity 1+ pitting edema; No deformity  SKIN: Warm and dry NEUROLOGIC:  Alert and oriented x 3 PSYCHIATRIC:  Normal affect   EKG:  EKG is ordered today.  The ekg ordered today demonstrates NSR at rate of 63 bpm, no ST/TW abnormalities.  Diagnoses:    1. Paroxysmal atrial fibrillation (HCC)   2. Chronic anticoagulation   3. Essential hypertension, benign   4. Unsteady gait    Assessment and Plan:     PAF on chronic anticoagulation: In NSR today. Has rare occasions of feeling like she is in a fib. Rarely takes diltiazem for rate control (4-5 times per year). Does not feel increasing episodes of palpitations, fluttering, or chest discomfort. She thinks symptoms of unsteadiness could be side effect of Eliquis. We discussed her appointment with Pharm D on 03/09/21 and she does not recall discussion of Xarelto as an option for her. No concerns for bleeding. Will send note to pharmacist for assistance. Continue Eliquis and prn diltiazem for now.    Unsteady gait: Having difficulty Patient felt better after standing for 3 minutes with CMA while having orthostatic vital signs completed. She is using a cane for assistance. Feels that symptoms may be caused by Eliquis but agrees to follow-up with PCP regarding feelings of leg numbness and tingling. See note above re discussion of switching from Eliquis to Xarelto.   Essential hypertension: Orthostatics done today in the office - no significant change. She reports no significant low BP readings at home. Continue Indapamide and prn diltiazem.    Disposition: 6 mo f/u with Dr. Oval Linsey     Medication Adjustments/Labs and Tests Ordered: Current medicines are reviewed at length with the patient today.  Concerns regarding medicines are outlined above.   Orders Placed This Encounter  Procedures   EKG 12-Lead   No orders of the defined types were placed in this encounter.   Patient Instructions  Medication Instructions:  Your Physician recommend you continue on your current medication as directed.    May consider switching from Eliquis to [place]. Will send to our pharmacy team  for review of cost for your insurance.   *If you need a refill on your cardiac medications before your next appointment, please call your pharmacy*   Lab Work: None ordered today   Testing/Procedures: None ordered today    Follow-Up: At [facility], you and your health needs are our priority.  As part of our continuing mission to provide you with exceptional heart care, we have created designated Provider Care Teams.  These Care Teams include your primary Cardiologist (physician) and Advanced Practice Providers (APPs -  Physician Assistants and Nurse Practitioners) who all work together to provide you with the care you need, when you need it.  We recommend signing up for the patient portal called "MyChart".  Sign up information is provided on this After Visit Summary.  MyChart is used to connect with patients for Virtual Visits (Telemedicine).  Patients are able to view lab/test results, encounter notes, upcoming appointments, etc.  Non-urgent messages can be sent to your provider as well.   To learn more about what you can do with MyChart, go to [email or web address].    Your next appointment:   6 month(s)  The format for your next appointment:   In Person  Provider:   Skeet Latch, MD, Laurann Montana, NP, or Coletta Memos, NP        Signed, Ann Maki Lanice Schwab, NP  05/25/2021 4:34 PM    [place]

## 2021-05-26 ENCOUNTER — Telehealth: Payer: Self-pay

## 2021-05-26 ENCOUNTER — Telehealth: Payer: Self-pay | Admitting: Internal Medicine

## 2021-05-26 MED ORDER — RIVAROXABAN 20 MG PO TABS: 20.0000 mg | ORAL_TABLET | Freq: Every day | ORAL | 3 refills | Status: DC

## 2021-05-26 NOTE — Telephone Encounter (Signed)
Patient states cardiologist discontinued and prescribed her new medication  Patient is requesting a call back to discuss change of medications

## 2021-05-26 NOTE — Telephone Encounter (Signed)
Pt has been scheduled for a follow up on 12/6 @ 2.20pm. Per cardiology wanted pt to follow up with PCP in regard to med changes.

## 2021-05-26 NOTE — Telephone Encounter (Signed)
Pt called back inquiring about appt on 03-09-21 [facility] per [place] note: "We discussed her appointment with Pharm D on 03/09/21 and she does not recall discussion of Xarelto as an option for her. No concerns for bleeding. Will send note to pharmacist for assistance. Continue Eliquis and prn diltiazem for now.  Pt would like to know if she is going to start Xarelto. Will forward to [employer] for review/comment.

## 2021-05-26 NOTE — Telephone Encounter (Signed)
Xarelto was not discussed at 9/12 visit since it's the same cost as Eliquis which was her primary concern. Duplicate message, PharmD received inbox message from [place] after visit yesterday, will copy below response as message made it sound as though pt was going to make the decision regarding potentially changing to Xarelto, not [place], after following up with her PCP.    Makeda Peeks, Harlon Flor, RPH-CPP  [place], Lanice Schwab, NP Agree Eliquis should not be contributing to her feeling unsteady. If she does wish to change, Xarelto is a tier 3 med like her Eliquis is so the copay would be the same (usually $45/1 month or $90/3 months when not in the donut hole).   Thanks,  Nydia Ytuarte        Previous Messages   ----- Message -----  From: Emmaline Life, NP  Sent: 05/25/2021   4:23 PM EST  To: Cv Div Pharmd   Hey guys,   I saw this patient in clinic today. She thinks Eliquis may be causing her to be unsteady. I encouraged her to f/u with ortho for potential causes from DDD and old left knee replacement but she asked about other options for DOAC and I mentioned Xarelto. She saw you on 03/09/21 to discuss switching from Eliquis to warfarin but decided against it. I did not see that Xarelto was discussed. Could you please advise on cost? She is going to talk to PCP about her back and then let us know if she decides she wants to pursue switching DOACs.   Thank you!  Sharyn Lull

## 2021-05-26 NOTE — Telephone Encounter (Signed)
Discussed PHARMD message with pt, verbalizes understanding. Verified insurance, [facility].

## 2021-05-28 ENCOUNTER — Encounter: Payer: Self-pay | Admitting: Orthopedic Surgery

## 2021-05-28 NOTE — Progress Notes (Signed)
Office Visit Note   Patient: Claudia [name]           Date of Birth: [birth year]           MRN: [id number] Visit Date: 05/18/2021              Requested by: Janith Lima, MD [address] [place],  [place] [address] PCP: Janith Lima, MD  Chief Complaint  Patient presents with   Lower Back - Pain      HPI: Patient is an 87 year old woman who has had chronic lower back pain with radicular symptoms.  Patient states she has had epidural steroid injections years ago that lasted about a week.  She has her lumbar spine surgery x2 with Dr. Hal Neer.  She is currently going to draw [place] for exercise.  Assessment & Plan: Visit Diagnoses:  1. Chronic left-sided low back pain with bilateral sciatica     Plan: Patient is provided a prescription for Neurontin.  With patient's instability with start up and balance she seems like she is having some orthostatic hypotension.  Recommend that she follow-up with cardiology for this.  Follow-Up Instructions: Return in about 2 months (around 07/18/2021).   Ortho Exam  Patient is alert, oriented, no adenopathy, well-dressed, normal affect, normal respiratory effort. Examination patient has a negative straight leg raise bilaterally no focal motor weakness in either lower extremity.  Patient states that she has start up dizziness problems but no radicular symptoms.  Radiograph shows significant degenerative scoliosis of the lumbar spine with stable intact hardware.  Imaging: No results found. No images are attached to the encounter.  Labs: Lab Results  Component Value Date   HGBA1C 5.5 11/03/2020   HGBA1C 5.8 (H) 08/24/2015   HGBA1C 5.6 08/10/2011   ESRSEDRATE 9 08/10/2011   CRP 1.1 08/10/2011   LABURIC 4.7 08/10/2011     Lab Results  Component Value Date   ALBUMIN 4.1 05/05/2021   ALBUMIN 4.1 05/05/2020   ALBUMIN 4.0 02/20/2018    Lab Results  Component Value Date   MG 2.0 01/01/2016   MG 2.2 02/01/2012   Lab  Results  Component Value Date   VD25OH 23.5 08/10/2011    No results found for: PREALBUMIN CBC EXTENDED Latest Ref Rng & Units 05/05/2021 06/11/2020 05/05/2020  WBC 4.0 - 10.5 K/uL 4.1 4.4 5.0  RBC 3.87 - 5.11 Mil/uL 4.32 4.37 4.36  HGB 12.0 - 15.0 g/dL 12.2 12.2 12.2  HCT 36.0 - 46.0 % 37.4 39.8 37.4  PLT 150.0 - 400.0 K/uL 177.0 200 216.0  NEUTROABS 1.4 - 7.7 K/uL 2.0 - 2.5  LYMPHSABS 0.7 - 4.0 K/uL 1.4 - 1.7     There is no height or weight on file to calculate BMI.  Orders:  Orders Placed This Encounter  Procedures   XR Lumbar Spine 2-3 Views   Meds ordered this encounter  Medications   gabapentin (NEURONTIN) 100 MG capsule    Sig: Take 1 capsule (100 mg total) by mouth 3 (three) times daily. When necessary for neuropathy pain    Dispense:  90 capsule    Refill:  3     Procedures: No procedures performed  Clinical Data: No additional findings.  ROS:  All other systems negative, except as noted in the HPI. Review of Systems  Objective: Vital Signs: There were no vitals taken for this visit.  Specialty Comments:  No specialty comments available.  PMFS History: Patient Active Problem List   Diagnosis Date  Noted   Encounter for general adult medical examination with abnormal findings 05/05/2021   PAF (paroxysmal atrial fibrillation) ([place]) 11/03/2020   Stage 3b chronic kidney disease ([place]) 05/06/2020   Chronic left-sided low back pain without sciatica 12/25/2018   Spinal stenosis in cervical region 05/12/2016   Therapeutic opioid-induced constipation (OIC) 01/01/2016   Chronic idiopathic constipation 09/11/2013   Lumbosacral spondylosis without myelopathy 04/09/2013   Postlaminectomy syndrome, lumbar region 04/09/2013   Anticoagulation management encounter 02/01/2012   Neuropathy, peripheral 08/04/2011   Pure hypercholesterolemia 08/04/2011   Essential hypertension, benign 08/04/2011   Hypothyroidism 08/04/2011   DJD (degenerative joint disease) of knee  08/04/2011   Gout 08/04/2011   Past Medical History:  Diagnosis Date   Abnormality of gait    Brachial neuritis or radiculitis NOS    Degeneration of lumbar or lumbosacral intervertebral disc    Essential hypertension, benign    Gouty arthropathy    Lumbago    Obesity    Osteoarthritis    Pure hypercholesterolemia    Unspecified hypothyroidism     Family History  Problem Relation Age of Onset   Heart disease Father    Heart attack Father    Hypertension Mother    Alzheimer's disease Mother    Diabetes Sister    Cirrhosis Brother    Diabetes Sister    Diabetes Sister    Diabetes Brother    Heart attack Brother    Cancer Neg Hx    Kidney disease Neg Hx     Past Surgical History:  Procedure Laterality Date   ABDOMINAL HYSTERECTOMY  03/13/2010   BACK SURGERY     LUMBAR LAMINECTOMY  03/13/2010   right knee replacement  03/13/2010   Social History   Occupational History   Not on file  Tobacco Use   Smoking status: Never   Smokeless tobacco: Never  Substance and Sexual Activity   Alcohol use: No    Comment: social   Drug use: No   Sexual activity: Never

## 2021-06-02 ENCOUNTER — Other Ambulatory Visit: Payer: Self-pay

## 2021-06-02 ENCOUNTER — Encounter: Payer: Self-pay | Admitting: Internal Medicine

## 2021-06-02 ENCOUNTER — Ambulatory Visit (INDEPENDENT_AMBULATORY_CARE_PROVIDER_SITE_OTHER): Payer: PPO | Admitting: Internal Medicine

## 2021-06-02 VITALS — BP 158/86 | HR 56 | Temp 97.7°F | Ht 70.0 in | Wt 211.0 lb

## 2021-06-02 DIAGNOSIS — M170 Bilateral primary osteoarthritis of knee: Secondary | ICD-10-CM

## 2021-06-02 DIAGNOSIS — I10 Essential (primary) hypertension: Secondary | ICD-10-CM | POA: Diagnosis not present

## 2021-06-02 DIAGNOSIS — M545 Low back pain, unspecified: Secondary | ICD-10-CM | POA: Diagnosis not present

## 2021-06-02 DIAGNOSIS — M47817 Spondylosis without myelopathy or radiculopathy, lumbosacral region: Secondary | ICD-10-CM | POA: Diagnosis not present

## 2021-06-02 DIAGNOSIS — G8929 Other chronic pain: Secondary | ICD-10-CM

## 2021-06-02 DIAGNOSIS — M4802 Spinal stenosis, cervical region: Secondary | ICD-10-CM | POA: Diagnosis not present

## 2021-06-02 DIAGNOSIS — M961 Postlaminectomy syndrome, not elsewhere classified: Secondary | ICD-10-CM

## 2021-06-02 MED ORDER — FENTANYL 50 MCG/HR TD PT72: 1.0000 | MEDICATED_PATCH | TRANSDERMAL | 0 refills | Status: DC

## 2021-06-02 NOTE — Patient Instructions (Signed)

## 2021-06-02 NOTE — Progress Notes (Signed)
Subjective:  Patient ID: Claudia [name], female    DOB: 1934/03/30  Age: 87 y.o. MRN: [id number]  CC: Hypertension, Hyperlipidemia, Osteoarthritis, and Back Pain  This visit occurred during the SARS-CoV-2 public health emergency.  Safety protocols were in place, including screening questions prior to the visit, additional usage of staff PPE, and extensive cleaning of exam room while observing appropriate contact time as indicated for disinfecting solutions.    HPI Claudia [name] presents for f/up -   She continues to complain of low back pain.  It interferes with her sleep and her daily activities.  She said she recently saw an orthopedist and had x-rays done of her lower back.  Hydrocodone only controls her pain for about 30 to 45 minutes.  She would like to try something that controls the pain for longer periods of time.  Outpatient Medications Prior to Visit  Medication Sig Dispense Refill   cetirizine (ZYRTEC) 10 MG tablet Take 1 tablet (10 mg total) by mouth daily. 90 tablet 1   diclofenac Sodium (VOLTAREN) 1 % GEL Apply topically 4 (four) times daily.     diltiazem (CARDIZEM) 30 MG tablet TAKE 1 TABLET BY MOUTH EVERY 4 HOURS AS NEEDED FOR AFIB HEART RATE GREATER THAN 100 AS LONG AS BLOOD PRESSURE LESS THAN 100 60 tablet 1   gabapentin (NEURONTIN) 100 MG capsule Take 1 capsule (100 mg total) by mouth 3 (three) times daily. When necessary for neuropathy pain 90 capsule 3   HYDROcodone-acetaminophen (NORCO/VICODIN) 5-325 MG tablet TAKE ONE TABLET BY MOUTH EVERY 8 HOURS, take BEFORE A meal 90 tablet 0   rivaroxaban (XARELTO) 20 MG TABS tablet Take 1 tablet (20 mg total) by mouth daily with supper. 30 tablet 3   indapamide (LOZOL) 1.25 MG tablet Take 1 tablet (1.25 mg total) by mouth daily. 90 tablet 1   rosuvastatin (CRESTOR) 10 MG tablet Take 1 tablet (10 mg total) by mouth daily. 90 tablet 1   vitamin C (ASCORBIC ACID) 500 MG tablet Take 500 mg by mouth daily.     vitamin E 180  MG (400 UNITS) capsule Take 400 Units by mouth daily.     linaclotide (LINZESS) 290 MCG CAPS capsule Take 1 capsule (290 mcg total) by mouth daily before breakfast. 90 capsule 1   No facility-administered medications prior to visit.    ROS Review of Systems  Constitutional:  Negative for diaphoresis and fatigue.  HENT: Negative.    Eyes: Negative.   Respiratory:  Negative for cough, chest tightness and wheezing.   Cardiovascular:  Negative for chest pain, palpitations and leg swelling.  Gastrointestinal:  Positive for constipation. Negative for abdominal pain and diarrhea.  Endocrine: Negative.   Genitourinary: Negative.  Negative for difficulty urinating.  Musculoskeletal:  Positive for arthralgias, back pain and neck pain. Negative for joint swelling.  Skin: Negative.   Neurological:  Negative for dizziness and weakness.  Hematological:  Negative for adenopathy. Does not bruise/bleed easily.  Psychiatric/Behavioral: Negative.     Objective:  BP (!) 158/86 (BP Location: Right Arm, Patient Position: Sitting, Cuff Size: Large)   Pulse (!) 56   Temp 97.7 F (36.5 C) (Oral)   Ht 5\' 10"  (1.778 m)   Wt 211 lb (95.7 kg)   SpO2 96%   BMI 30.28 kg/m   BP Readings from Last 3 Encounters:  06/02/21 (!) 158/86  05/25/21 (!) 142/61  05/05/21 130/76    Wt Readings from Last 3 Encounters:  06/02/21 211 lb (95.7  kg)  05/25/21 204 lb 14.4 oz (92.9 kg)  05/05/21 205 lb (93 kg)    Physical Exam Vitals reviewed.  HENT:     Nose: Nose normal.     Mouth/Throat:     Mouth: Mucous membranes are moist.  Eyes:     Conjunctiva/sclera: Conjunctivae normal.  Cardiovascular:     Rate and Rhythm: Normal rate and regular rhythm.     Heart sounds: No murmur heard. Pulmonary:     Effort: Pulmonary effort is normal.     Breath sounds: No stridor. No wheezing, rhonchi or rales.  Abdominal:     General: Abdomen is flat.     Palpations: There is no mass.     Tenderness: There is no abdominal  tenderness. There is no guarding.     Hernia: No hernia is present.  Musculoskeletal:        General: Normal range of motion.     Cervical back: Neck supple.     Right lower leg: No edema.     Left lower leg: No edema.  Lymphadenopathy:     Cervical: No cervical adenopathy.  Skin:    General: Skin is warm and dry.  Neurological:     General: No focal deficit present.     Mental Status: She is alert.  Psychiatric:        Mood and Affect: Mood normal.        Behavior: Behavior normal.    Lab Results  Component Value Date   WBC 4.1 05/05/2021   HGB 12.2 05/05/2021   HCT 37.4 05/05/2021   PLT 177.0 05/05/2021   GLUCOSE 110 (H) 05/05/2021   CHOL 148 05/05/2021   TRIG 85.0 05/05/2021   HDL 54.40 05/05/2021   LDLDIRECT 158.9 09/05/2012   LDLCALC 77 05/05/2021   ALT 10 05/05/2021   AST 18 05/05/2021   NA 140 05/05/2021   K 4.2 05/05/2021   CL 104 05/05/2021   CREATININE 1.05 05/05/2021   BUN 23 05/05/2021   CO2 29 05/05/2021   TSH 4.92 05/05/2021   INR 1.00 04/04/2017   HGBA1C 5.5 11/03/2020    No results found.  Assessment & Plan:   Scott was seen today for hypertension, hyperlipidemia, osteoarthritis and back pain.  Diagnoses and all orders for this visit:  Essential hypertension, benign- Her blood pressure is adequately well controlled.  Lumbosacral spondylosis without myelopathy- Will add a Duragesic patch to the hydrocodone. -     Discontinue: fentaNYL (DURAGESIC) 50 MCG/HR; Place 1 patch onto the skin every 3 (three) days. -     fentaNYL (DURAGESIC) 50 MCG/HR; Place 1 patch onto the skin every 3 (three) days.  Primary osteoarthritis of both knees -     Discontinue: fentaNYL (DURAGESIC) 50 MCG/HR; Place 1 patch onto the skin every 3 (three) days. -     fentaNYL (DURAGESIC) 50 MCG/HR; Place 1 patch onto the skin every 3 (three) days.  Chronic left-sided low back pain without sciatica -     Discontinue: fentaNYL (DURAGESIC) 50 MCG/HR; Place 1 patch onto  the skin every 3 (three) days. -     fentaNYL (DURAGESIC) 50 MCG/HR; Place 1 patch onto the skin every 3 (three) days.  Postlaminectomy syndrome, lumbar region -     Discontinue: fentaNYL (DURAGESIC) 50 MCG/HR; Place 1 patch onto the skin every 3 (three) days. -     fentaNYL (DURAGESIC) 50 MCG/HR; Place 1 patch onto the skin every 3 (three) days.  Spinal stenosis in cervical region -  Discontinue: fentaNYL (DURAGESIC) 50 MCG/HR; Place 1 patch onto the skin every 3 (three) days. -     fentaNYL (DURAGESIC) 50 MCG/HR; Place 1 patch onto the skin every 3 (three) days.  I have discontinued Sydne Krahl. [place]'s vitamin C, vitamin E, indapamide, linaclotide, and rosuvastatin. I am also having her maintain her cetirizine, diltiazem, diclofenac Sodium, gabapentin, HYDROcodone-acetaminophen, rivaroxaban, and fentaNYL.  Meds ordered this encounter  Medications   DISCONTD: fentaNYL (DURAGESIC) 50 MCG/HR    Sig: Place 1 patch onto the skin every 3 (three) days.    Dispense:  10 patch    Refill:  0   fentaNYL (DURAGESIC) 50 MCG/HR    Sig: Place 1 patch onto the skin every 3 (three) days.    Dispense:  10 patch    Refill:  0     Follow-up: Return in about 3 months (around 08/31/2021).  [name], MD

## 2021-06-03 ENCOUNTER — Telehealth: Payer: Self-pay | Admitting: Internal Medicine

## 2021-06-03 MED ORDER — FENTANYL 50 MCG/HR TD PT72: 1.0000 | MEDICATED_PATCH | TRANSDERMAL | 0 refills | Status: DC

## 2021-06-03 NOTE — Telephone Encounter (Signed)
Patient states rx fentaNYL ([place]) 50 MCG/HR was sent to wrong pharmacy  Patient is requesting rx sent to [place], [name] - [address]

## 2021-06-08 NOTE — Telephone Encounter (Signed)
Key: [id number]  [facility] has been informed.

## 2021-06-08 NOTE — Telephone Encounter (Signed)
Called to verify whether pt has tried and failed an extended release prior. Medication listed in PA was not an AR release drug and does not qualify. If pt has not used extended release they need clinical documentation showing reasons pt is unable to take it.   Best contact #: [phone]

## 2021-06-08 NOTE — Telephone Encounter (Signed)
Approved 06/08/21 - 06/08/22

## 2021-06-08 NOTE — Telephone Encounter (Signed)
PA was received today. Will be completed by end of business today.

## 2021-06-08 NOTE — Telephone Encounter (Signed)
Lauryn from [employer] has called to get an update on PA for medication.

## 2021-06-10 ENCOUNTER — Telehealth: Payer: Self-pay | Admitting: Cardiovascular Disease

## 2021-06-10 ENCOUNTER — Telehealth: Payer: Self-pay

## 2021-06-10 MED ORDER — DILTIAZEM HCL 30 MG PO TABS: ORAL_TABLET | ORAL | 3 refills | Status: DC

## 2021-06-10 MED ORDER — DILTIAZEM HCL 30 MG PO TABS: ORAL_TABLET | ORAL | 1 refills | Status: DC

## 2021-06-10 NOTE — Telephone Encounter (Signed)
Pt called into the office stating that she needs some advise. Pt was prescribed a fentanyl patch 50 mg that she wears for 3 days and changes it and her PCP Dr. Ronnald Ramp ordered a 30 day supply.  Patient wanted to know if it was safe for her to continue taking the gabapentin.

## 2021-06-10 NOTE — Telephone Encounter (Signed)
Pt c/o medication issue:  1. Name of Medication:  fentaNYL (DURAGESIC) 50 MCG/HR  2. How are you currently taking this medication (dosage and times per day)?   3. Are you having a reaction (difficulty breathing--STAT)?   4. What is your medication issue?   Patient states she has chronic back pain and her PCP, Dr. [name] prescribed this medication. She would like to confirm that this will not interfere with any of her heart medications. Please advise.

## 2021-06-10 NOTE — Telephone Encounter (Signed)
Pt updated with Pharm D's recommendations and verbalized understanding. Pt also requesting refill for PRN diltiazem. New Rx sent to requested pharmacy.

## 2021-06-10 NOTE — Telephone Encounter (Signed)
Patient would like to know if she needs to continue taking Diltiazem. If so, she would like to have it sent to [facility].   *STAT* If patient is at the pharmacy, call can be transferred to refill team.   1. Which medications need to be refilled? (please list name of each medication and dose if known)  diltiazem (CARDIZEM) 30 MG tablet  2. Which pharmacy/location (including street and city if local pharmacy) is medication to be sent to? [facility] - [place], [name] - [address]  3. Do they need a 30 day or 90 day supply?  90 day supply

## 2021-06-10 NOTE — Telephone Encounter (Signed)
No contraindications with her other meds, but diltiazem can increase the concentration of her fentanyl, and the CNS depressant effects of fentanyl can be increased by her cetirizine, gabapentin, and Vicodin. Should be monitored closely by prescribing physician.

## 2021-06-11 ENCOUNTER — Ambulatory Visit (INDEPENDENT_AMBULATORY_CARE_PROVIDER_SITE_OTHER): Payer: PPO | Admitting: Orthopedic Surgery

## 2021-06-11 ENCOUNTER — Encounter: Payer: Self-pay | Admitting: Orthopedic Surgery

## 2021-06-11 ENCOUNTER — Ambulatory Visit (INDEPENDENT_AMBULATORY_CARE_PROVIDER_SITE_OTHER): Payer: PPO

## 2021-06-11 DIAGNOSIS — M25562 Pain in left knee: Secondary | ICD-10-CM

## 2021-06-11 NOTE — Progress Notes (Signed)
Office Visit Note   Patient: Claudia [name]           Date of Birth: [birth year]           MRN: [id number] Visit Date: 06/11/2021              Requested by: Janith Lima, MD [address] [place],  [place] [address] PCP: Janith Lima, MD  Chief Complaint  Patient presents with   Left Knee - Pain      HPI: Patient is a 87 year old woman who is status post a left total knee arthroplasty about 11 years ago at [facility].  Patient states she has been having increasing pain and instability in her left knee.  She states her knee is buckling and she almost fell getting out of bed.  She states the pain is been worse over the past several weeks.  Patient states she was recently started on a fentanyl patch 50 mg every 3 days for her back symptoms.  She states she is taking Vicodin as instructed with fentanyl.  Assessment & Plan: Visit Diagnoses:  1. Acute pain of left knee     Plan: We will set patient up with physical therapy for strengthening.  Discussed that it is more of a muscle weakness that is causing the instability with her knee.  Do not feel that a brace would be helpful.  Will reevaluate as scheduled in about 2 months.  Follow-Up Instructions: Return in about 2 months (around 08/12/2021).   Ortho Exam  Patient is alert, oriented, no adenopathy, well-dressed, normal affect, normal respiratory effort. Examination patient has no instability with mechanical flexion extension of her knee the patella tracks midline she has full active extension.  She has slight amount of weakness with varus and valgus stress and anterior drawer but no evidence of ligamentous instability.  No evidence of subluxation of the patella.  There is no redness no cellulitis no tenderness to palpation.  Imaging: XR KNEE 3 VIEW LEFT  Result Date: 06/11/2021 2 view radiographs of the left knee shows a stable total knee arthroplasty.  There is no malalignment there is no lucency  around the implants.  No images are attached to the encounter.  Labs: Lab Results  Component Value Date   HGBA1C 5.5 11/03/2020   HGBA1C 5.8 (H) 08/24/2015   HGBA1C 5.6 08/10/2011   ESRSEDRATE 9 08/10/2011   CRP 1.1 08/10/2011   LABURIC 4.7 08/10/2011     Lab Results  Component Value Date   ALBUMIN 4.1 05/05/2021   ALBUMIN 4.1 05/05/2020   ALBUMIN 4.0 02/20/2018    Lab Results  Component Value Date   MG 2.0 01/01/2016   MG 2.2 02/01/2012   Lab Results  Component Value Date   VD25OH 23.5 08/10/2011    No results found for: PREALBUMIN CBC EXTENDED Latest Ref Rng & Units 05/05/2021 06/11/2020 05/05/2020  WBC 4.0 - 10.5 K/uL 4.1 4.4 5.0  RBC 3.87 - 5.11 Mil/uL 4.32 4.37 4.36  HGB 12.0 - 15.0 g/dL 12.2 12.2 12.2  HCT 36.0 - 46.0 % 37.4 39.8 37.4  PLT 150.0 - 400.0 K/uL 177.0 200 216.0  NEUTROABS 1.4 - 7.7 K/uL 2.0 - 2.5  LYMPHSABS 0.7 - 4.0 K/uL 1.4 - 1.7     There is no height or weight on file to calculate BMI.  Orders:  Orders Placed This Encounter  Procedures   XR KNEE 3 VIEW LEFT   No orders of the defined types  were placed in this encounter.    Procedures: No procedures performed  Clinical Data: No additional findings.  ROS:  All other systems negative, except as noted in the HPI. Review of Systems  Objective: Vital Signs: There were no vitals taken for this visit.  Specialty Comments:  No specialty comments available.  PMFS History: Patient Active Problem List   Diagnosis Date Noted   Encounter for general adult medical examination with abnormal findings 05/05/2021   PAF (paroxysmal atrial fibrillation) ([place]) 11/03/2020   Stage 3b chronic kidney disease ([place]) 05/06/2020   Chronic left-sided low back pain without sciatica 12/25/2018   Spinal stenosis in cervical region 05/12/2016   Therapeutic opioid-induced constipation ([facility]) 01/01/2016   Chronic idiopathic constipation 09/11/2013   Lumbosacral spondylosis without myelopathy  04/09/2013   Postlaminectomy syndrome, lumbar region 04/09/2013   Anticoagulation management encounter 02/01/2012   Neuropathy, peripheral 08/04/2011   Pure hypercholesterolemia 08/04/2011   Essential hypertension, benign 08/04/2011   Hypothyroidism 08/04/2011   DJD (degenerative joint disease) of knee 08/04/2011   Gout 08/04/2011   Past Medical History:  Diagnosis Date   Abnormality of gait    Brachial neuritis or radiculitis NOS    Degeneration of lumbar or lumbosacral intervertebral disc    Essential hypertension, benign    Gouty arthropathy    Lumbago    Obesity    Osteoarthritis    Pure hypercholesterolemia    Unspecified hypothyroidism     Family History  Problem Relation Age of Onset   Heart disease Father    Heart attack Father    Hypertension Mother    Alzheimer's disease Mother    Diabetes Sister    Cirrhosis Brother    Diabetes Sister    Diabetes Sister    Diabetes Brother    Heart attack Brother    Cancer Neg Hx    Kidney disease Neg Hx     Past Surgical History:  Procedure Laterality Date   ABDOMINAL HYSTERECTOMY  03/13/2010   BACK SURGERY     LUMBAR LAMINECTOMY  03/13/2010   right knee replacement  03/13/2010   Social History   Occupational History   Not on file  Tobacco Use   Smoking status: Never   Smokeless tobacco: Never  Substance and Sexual Activity   Alcohol use: No    Comment: social   Drug use: No   Sexual activity: Never

## 2021-06-15 ENCOUNTER — Telehealth: Payer: Self-pay

## 2021-06-15 ENCOUNTER — Other Ambulatory Visit: Payer: Self-pay | Admitting: Internal Medicine

## 2021-06-15 DIAGNOSIS — M47817 Spondylosis without myelopathy or radiculopathy, lumbosacral region: Secondary | ICD-10-CM

## 2021-06-15 DIAGNOSIS — M170 Bilateral primary osteoarthritis of knee: Secondary | ICD-10-CM

## 2021-06-15 DIAGNOSIS — M545 Low back pain, unspecified: Secondary | ICD-10-CM

## 2021-06-15 DIAGNOSIS — M4802 Spinal stenosis, cervical region: Secondary | ICD-10-CM

## 2021-06-15 DIAGNOSIS — M961 Postlaminectomy syndrome, not elsewhere classified: Secondary | ICD-10-CM

## 2021-06-15 MED ORDER — FENTANYL 50 MCG/HR TD PT72: 1.0000 | MEDICATED_PATCH | TRANSDERMAL | 0 refills | Status: DC

## 2021-06-15 NOTE — Telephone Encounter (Signed)
Pt has stated she received her fentaNYL (DURAGESIC) 50 MCG/Hr it was two boxes. She has stated they are suppose to be boxes of 5 patches each and noticed one box has 5 and the second box only had two patches in it. Pt has contacted her pharmacy to inform them of this and they told the pt she is to contact PCP for him to send in a rx for 3 more patches.  Pt number is [phone].

## 2021-06-21 ENCOUNTER — Emergency Department (HOSPITAL_COMMUNITY)
Admission: EM | Admit: 2021-06-21 | Discharge: 2021-06-21 | Disposition: A | Discharge status: Home / Self Care | Payer: PPO | Attending: Emergency Medicine | Admitting: Emergency Medicine

## 2021-06-21 ENCOUNTER — Other Ambulatory Visit: Payer: Self-pay

## 2021-06-21 ENCOUNTER — Emergency Department (HOSPITAL_COMMUNITY): Payer: PPO

## 2021-06-21 ENCOUNTER — Encounter (HOSPITAL_COMMUNITY): Payer: Self-pay

## 2021-06-21 DIAGNOSIS — G8929 Other chronic pain: Secondary | ICD-10-CM | POA: Diagnosis not present

## 2021-06-21 DIAGNOSIS — Z7901 Long term (current) use of anticoagulants: Secondary | ICD-10-CM | POA: Insufficient documentation

## 2021-06-21 DIAGNOSIS — R600 Localized edema: Secondary | ICD-10-CM | POA: Diagnosis not present

## 2021-06-21 DIAGNOSIS — I129 Hypertensive chronic kidney disease with stage 1 through stage 4 chronic kidney disease, or unspecified chronic kidney disease: Secondary | ICD-10-CM | POA: Diagnosis not present

## 2021-06-21 DIAGNOSIS — R609 Edema, unspecified: Secondary | ICD-10-CM | POA: Diagnosis not present

## 2021-06-21 DIAGNOSIS — M546 Pain in thoracic spine: Secondary | ICD-10-CM | POA: Insufficient documentation

## 2021-06-21 DIAGNOSIS — Z79899 Other long term (current) drug therapy: Secondary | ICD-10-CM | POA: Diagnosis not present

## 2021-06-21 DIAGNOSIS — Z20822 Contact with and (suspected) exposure to covid-19: Secondary | ICD-10-CM | POA: Diagnosis not present

## 2021-06-21 DIAGNOSIS — J811 Chronic pulmonary edema: Secondary | ICD-10-CM | POA: Diagnosis not present

## 2021-06-21 DIAGNOSIS — M545 Low back pain, unspecified: Secondary | ICD-10-CM | POA: Diagnosis not present

## 2021-06-21 DIAGNOSIS — R7309 Other abnormal glucose: Secondary | ICD-10-CM | POA: Diagnosis not present

## 2021-06-21 DIAGNOSIS — N1832 Chronic kidney disease, stage 3b: Secondary | ICD-10-CM | POA: Diagnosis not present

## 2021-06-21 DIAGNOSIS — E039 Hypothyroidism, unspecified: Secondary | ICD-10-CM | POA: Insufficient documentation

## 2021-06-21 DIAGNOSIS — R42 Dizziness and giddiness: Secondary | ICD-10-CM | POA: Diagnosis not present

## 2021-06-21 DIAGNOSIS — R0602 Shortness of breath: Secondary | ICD-10-CM | POA: Insufficient documentation

## 2021-06-21 DIAGNOSIS — M549 Dorsalgia, unspecified: Secondary | ICD-10-CM

## 2021-06-21 DIAGNOSIS — I10 Essential (primary) hypertension: Secondary | ICD-10-CM | POA: Diagnosis not present

## 2021-06-21 LAB — URINALYSIS, ROUTINE W REFLEX MICROSCOPIC
Bilirubin Urine: NEGATIVE
Glucose, UA: NEGATIVE mg/dL
Ketones, ur: 15 mg/dL — AB
Leukocytes,Ua: NEGATIVE
Nitrite: NEGATIVE
Protein, ur: NEGATIVE mg/dL
Specific Gravity, Urine: 1.025 (ref 1.005–1.030)
pH: 6 (ref 5.0–8.0)

## 2021-06-21 LAB — CBC
HCT: 38.9 % (ref 36.0–46.0)
Hemoglobin: 12.1 g/dL (ref 12.0–15.0)
MCH: 28.1 pg (ref 26.0–34.0)
MCHC: 31.1 g/dL (ref 30.0–36.0)
MCV: 90.5 fL (ref 80.0–100.0)
Platelets: 197 10*3/uL (ref 150–400)
RBC: 4.3 MIL/uL (ref 3.87–5.11)
RDW: 13.7 % (ref 11.5–15.5)
WBC: 3.8 10*3/uL — ABNORMAL LOW (ref 4.0–10.5)
nRBC: 0 % (ref 0.0–0.2)

## 2021-06-21 LAB — URINALYSIS, MICROSCOPIC (REFLEX)
Bacteria, UA: NONE SEEN
Squamous Epithelial / HPF: NONE SEEN (ref 0–5)

## 2021-06-21 LAB — RESP PANEL BY RT-PCR (FLU A&B, COVID) ARPGX2
Influenza A by PCR: NEGATIVE
Influenza B by PCR: NEGATIVE
SARS Coronavirus 2 by RT PCR: NEGATIVE

## 2021-06-21 LAB — COMPREHENSIVE METABOLIC PANEL
ALT: 23 U/L (ref 0–44)
AST: 28 U/L (ref 15–41)
Albumin: 3.7 g/dL (ref 3.5–5.0)
Alkaline Phosphatase: 51 U/L (ref 38–126)
Anion gap: 8 (ref 5–15)
BUN: 17 mg/dL (ref 8–23)
CO2: 25 mmol/L (ref 22–32)
Calcium: 9.9 mg/dL (ref 8.9–10.3)
Chloride: 108 mmol/L (ref 98–111)
Creatinine, Ser: 0.85 mg/dL (ref 0.44–1.00)
GFR, Estimated: >60 mL/min (ref >60)
Glucose, Bld: 96 mg/dL (ref 70–99)
Potassium: 4.1 mmol/L (ref 3.5–5.1)
Sodium: 141 mmol/L (ref 135–145)
Total Bilirubin: 1.4 mg/dL — ABNORMAL HIGH (ref 0.3–1.2)
Total Protein: 6.5 g/dL (ref 6.5–8.1)

## 2021-06-21 LAB — CBG MONITORING, ED: Glucose-Capillary: 76 mg/dL (ref 70–99)

## 2021-06-21 LAB — BRAIN NATRIURETIC PEPTIDE: B Natriuretic Peptide: 208.5 pg/mL — ABNORMAL HIGH (ref 0.0–100.0)

## 2021-06-21 LAB — TROPONIN I (HIGH SENSITIVITY): Troponin I (High Sensitivity): 16 ng/L (ref <18)

## 2021-06-21 MED ORDER — FUROSEMIDE 20 MG PO TABS: 20.0000 mg | ORAL_TABLET | Freq: Every day | ORAL | 0 refills | Status: DC

## 2021-06-21 NOTE — Discharge Instructions (Addendum)
For the fluid in your legs and lungs, started you on a diuretic called Lasix.  You will take this once a day in the morning with breakfast.  I will help you pee out some of the fluid in your legs and lungs.  Try to keep your legs elevated at home.  Consider compression stockings for the swelling in your legs.  I prescribed a short-term course of the medicine, 15 days, to see if it improves your symptoms. You should follow-up with your doctor in the meantime and see if he wants to continue this medication.  You should also follow-up with your doctor about your back pain.

## 2021-06-21 NOTE — ED Notes (Signed)
Discharge instructions reviewed with patient and husband. Patient and husband verbalized understanding of instructions. Follow-up care and medications were reviewed. Patient wheeled out of ED in wheelchair and is waiting for a family friend to pick them up in the lobby. VSS upon discharge.

## 2021-06-21 NOTE — ED Triage Notes (Signed)
Pt BIB [facility] from home c/o of dizziness that started this morning and some leg swelling. Pt states they discontinued her water pill a few weeks back.    190/100 62 18 98

## 2021-06-21 NOTE — ED Provider Notes (Signed)
Emergency Medicine Provider Triage Evaluation Note  Claudia [name] , a 87 y.o. female  was evaluated in triage.  Pt complains of weakness. She states that same has been ongoing for the past 2 weeks. Also states that her doctor took her off of several medications at her appointment 3 weeks ago which included a diuretic. Swelling noted to BLE. Denies fevers, chills, chest pain, shortness of breath, n/v/d  Review of Systems  Positive:  Negative: See above  Physical Exam  BP (!) 204/78 (BP Location: Right Arm)    Pulse 60    Temp 98.2 F (36.8 C) (Oral)    Resp 18    SpO2 99%  Gen:   Awake, no distress   Resp:  Normal effort  MSK:   Moves extremities without difficulty  Other:  3+ pitting edema noted to bilateral lower extremities  Medical Decision Making  Medically screening exam initiated at 1:38 PM.  Appropriate orders placed.  AUDRINNA SHERMAN was informed that the remainder of the evaluation will be completed by another provider, this initial triage assessment does not replace that evaluation, and the importance of remaining in the ED until their evaluation is complete.    Nestor Lewandowsky 06/21/21 1341    Wyvonnia Dusky, MD 06/21/21 2012

## 2021-06-21 NOTE — ED Provider Notes (Signed)
[facility] EMERGENCY DEPARTMENT Provider Note   CSN: [id number] Arrival date & time: 06/21/21  1256     History CC: Fatigue, weakness  Claudia [name] is a 87 y.o. female presenting to emerge apartment generalized weakness.  The patient reports that she has felt weakness of her arms and legs for the past 3 weeks.  She feels she has no energy or strength.  She says that her doctor took her off her bill diltiazem about 2 weeks ago, per medical record review, this appears to be related to concerns that diltiazem could increase the levels of fentanyl, which she wears as a patch for chronic back pain.  She has had no other medication adjustments.  She does feel that there is no swelling in her legs bilaterally.  She feels her legs are heavy.  She said today she was having a difficult time getting to the bathroom.  She denies any shortness of breath or chest pain or history of congestive heart failure.  She does report a history of A. fib and is on Xarelto for that.  She denies any headaches, reports that she did have an episode of lightheadedness earlier today.  She denies any recent fevers or chills or infectious symptoms.  HPI     Past Medical History:  Diagnosis Date   Abnormality of gait    Brachial neuritis or radiculitis NOS    Degeneration of lumbar or lumbosacral intervertebral disc    Essential hypertension, benign    Gouty arthropathy    Lumbago    Obesity    Osteoarthritis    Pure hypercholesterolemia    Unspecified hypothyroidism     Patient Active Problem List   Diagnosis Date Noted   Encounter for general adult medical examination with abnormal findings 05/05/2021   PAF (paroxysmal atrial fibrillation) ([place]) 11/03/2020   Stage 3b chronic kidney disease ([place]) 05/06/2020   Chronic left-sided low back pain without sciatica 12/25/2018   Spinal stenosis in cervical region 05/12/2016   Therapeutic opioid-induced constipation (OIC) 01/01/2016    Chronic idiopathic constipation 09/11/2013   Lumbosacral spondylosis without myelopathy 04/09/2013   Postlaminectomy syndrome, lumbar region 04/09/2013   Anticoagulation management encounter 02/01/2012   Neuropathy, peripheral 08/04/2011   Pure hypercholesterolemia 08/04/2011   Essential hypertension, benign 08/04/2011   Hypothyroidism 08/04/2011   DJD (degenerative joint disease) of knee 08/04/2011   Gout 08/04/2011    Past Surgical History:  Procedure Laterality Date   ABDOMINAL HYSTERECTOMY  03/13/2010   BACK SURGERY     LUMBAR LAMINECTOMY  03/13/2010   right knee replacement  03/13/2010     OB History   No obstetric history on file.     Family History  Problem Relation Age of Onset   Heart disease Father    Heart attack Father    Hypertension Mother    Alzheimer's disease Mother    Diabetes Sister    Cirrhosis Brother    Diabetes Sister    Diabetes Sister    Diabetes Brother    Heart attack Brother    Cancer Neg Hx    Kidney disease Neg Hx     Social History   Tobacco Use   Smoking status: Never   Smokeless tobacco: Never  Substance Use Topics   Alcohol use: No    Comment: social   Drug use: No    Home Medications Prior to Admission medications   Medication Sig Start Date End Date Taking? Authorizing Provider  diltiazem (CARDIZEM) 30 MG  tablet TAKE 1 TABLET BY MOUTH EVERY 4 HOURS AS NEEDED FOR AFIB HEART RATE GREATER THAN 100 AS LONG AS BLOOD PRESSURE LESS THAN 100 Patient taking differently: Take 30 mg by mouth 4 (four) times daily as needed (AFIB). Take when heart rate > 100, as long as BP is <100 06/10/21  Yes Skeet Latch, MD  fentaNYL (DURAGESIC) 50 MCG/HR Place 1 patch onto the skin every 3 (three) days. 06/15/21  Yes Janith Lima, MD  furosemide (LASIX) 20 MG tablet Take 1 tablet (20 mg total) by mouth daily for 15 days. 06/21/21 07/06/21 Yes Adonay Scheier, Carola Rhine, MD  indapamide (LOZOL) 1.25 MG tablet Take 1.25 mg by mouth daily.   Yes [provider]  rivaroxaban (XARELTO) 20 MG TABS tablet Take 1 tablet (20 mg total) by mouth daily with supper. 05/26/21  Yes Supple, Megan E, RPH-CPP  rosuvastatin (CRESTOR) 10 MG tablet Take 10 mg by mouth daily.   Yes [provider]  vitamin C (ASCORBIC ACID) 500 MG tablet Take 500 mg by mouth daily.   Yes [provider]  vitamin E 200 UNIT capsule Take 200 Units by mouth daily.   Yes [provider]  cetirizine (ZYRTEC) 10 MG tablet Take 1 tablet (10 mg total) by mouth daily. Patient not taking: Reported on 06/21/2021 08/16/16   Janith Lima, MD  gabapentin (NEURONTIN) 100 MG capsule Take 1 capsule (100 mg total) by mouth 3 (three) times daily. When necessary for neuropathy pain Patient not taking: Reported on 06/21/2021 05/18/21   Newt Minion, MD  HYDROcodone-acetaminophen (NORCO/VICODIN) 5-325 MG tablet TAKE ONE TABLET BY MOUTH EVERY 8 HOURS, take BEFORE A meal Patient not taking: Reported on 06/21/2021 05/19/21   Janith Lima, MD    Allergies    Lipitor [atorvastatin] and Trileptal [oxcarbazepine]  Review of Systems   Review of Systems  Constitutional:  Negative for chills and fever.  Eyes:  Negative for pain and visual disturbance.  Respiratory:  Negative for cough and shortness of breath.   Cardiovascular:  Positive for leg swelling. Negative for chest pain.  Gastrointestinal:  Negative for abdominal pain and vomiting.  Genitourinary:  Negative for dysuria and hematuria.  Musculoskeletal:  Negative for arthralgias and myalgias.  Skin:  Negative for color change and rash.  Neurological:  Positive for light-headedness. Negative for syncope, weakness, numbness and headaches.  All other systems reviewed and are negative.  Physical Exam Updated Vital Signs BP (!) 198/81    Pulse 71    Temp 98.2 F (36.8 C)    Resp 14    SpO2 100%   Physical Exam Constitutional:      General: She is not in acute distress. HENT:     Head: Normocephalic and  atraumatic.  Eyes:     Conjunctiva/sclera: Conjunctivae normal.     Pupils: Pupils are equal, round, and reactive to light.  Cardiovascular:     Rate and Rhythm: Normal rate and regular rhythm.  Pulmonary:     Effort: Pulmonary effort is normal. No respiratory distress.  Abdominal:     General: There is no distension.     Tenderness: There is no abdominal tenderness.  Musculoskeletal:     Right lower leg: Edema present.     Left lower leg: Edema present.  Skin:    General: Skin is warm and dry.  Neurological:     General: No focal deficit present.     Mental Status: She is alert and oriented to  person, place, and time. Mental status is at baseline.  Psychiatric:        Mood and Affect: Mood normal.        Behavior: Behavior normal.    ED Results / Procedures / Treatments   Labs (all labs ordered are listed, but only abnormal results are displayed) Labs Reviewed  CBC - Abnormal; Notable for the following components:      Result Value   WBC 3.8 (*)    All other components within normal limits  URINALYSIS, ROUTINE W REFLEX MICROSCOPIC - Abnormal; Notable for the following components:   Hgb urine dipstick TRACE (*)    Ketones, ur 15 (*)    All other components within normal limits  COMPREHENSIVE METABOLIC PANEL - Abnormal; Notable for the following components:   Total Bilirubin 1.4 (*)    All other components within normal limits  BRAIN NATRIURETIC PEPTIDE - Abnormal; Notable for the following components:   B Natriuretic Peptide 208.5 (*)    All other components within normal limits  RESP PANEL BY RT-PCR (FLU A&B, COVID) ARPGX2  URINALYSIS, MICROSCOPIC (REFLEX)  CBG MONITORING, ED  TROPONIN I (HIGH SENSITIVITY)    EKG None  Radiology DG Chest Portable 1 View  Result Date: 06/21/2021 CLINICAL DATA:  Dizziness. EXAM: PORTABLE CHEST 1 VIEW COMPARISON:  12/04/2018 FINDINGS: Cardiopericardial silhouette is at upper limits of normal for size. There is pulmonary vascular  congestion without overt pulmonary edema. No pleural effusion. The visualized bony structures of the thorax show no acute abnormality. Telemetry leads overlie the chest. IMPRESSION: Pulmonary vascular congestion without acute cardiopulmonary findings. Electronically Signed   By: Misty Stanley M.D.   On: 06/21/2021 15:00    Procedures Procedures   Medications Ordered in ED Medications - No data to display  ED Course  I have reviewed the triage vital signs and the nursing notes.  Pertinent labs & imaging results that were available during my care of the patient were reviewed by me and considered in my medical decision making (see chart for details).  This patient complains of generalized weakness.  This involves an extensive number of treatment options, and is a complaint that carries with it a high risk of complications and morbidity.  The differential diagnosis includes anemia versus dehydration versus new onset congestive heart failure versus viral illness including COVID versus other.  I ordered, reviewed, and interpreted labs., showing normal glucose, negative COVID and flu, CMP and CBC largely unremarkable.  No evidence of acute anemia, no sign of ACS.  BNP mildly elevated 208. I ordered imaging studies which included x-ray of the chest I independently visualized and interpreted imaging which showed no life-threatening abnormalities, but did note some vascular congestion, and the monitor tracing which showed sinus rhythm  Patient's EKG per my interpretation shows a normal sinus rhythm without acute ischemic findings   Clinical Course as of 06/21/21 2017  Sun Jun 21, 2021  1643 I discussed the patient's work-up with her.  Clinically overall this would be consistent with mildly worsening congestive heart failure.  I think she would benefit from some diuretics.  She does not want any diuresis in the ED due to concerns getting to the bathroom, but advised that I sent a prescription to her  pharmacy.  I will start her on Lasix 20 mg daily.  I advised PCP follow-up, she is also complaining of back pain.  She verbalized understanding. [MT]    Clinical Course User Index [MT] Lawrence Roldan, Carola Rhine, MD  Final Clinical Impression(s) / ED Diagnoses Final diagnoses:  Edema, unspecified type  Chronic midline back pain, unspecified back location    Rx / DC Orders ED Discharge Orders          Ordered    furosemide (LASIX) 20 MG tablet  Daily        06/21/21 1644             Wyvonnia Dusky, MD 06/21/21 2017

## 2021-06-22 ENCOUNTER — Emergency Department (HOSPITAL_COMMUNITY): Payer: PPO

## 2021-06-22 ENCOUNTER — Emergency Department (HOSPITAL_COMMUNITY)
Admission: EM | Admit: 2021-06-22 | Discharge: 2021-06-23 | Disposition: A | Discharge status: Home / Self Care | Payer: PPO | Attending: Emergency Medicine | Admitting: Emergency Medicine

## 2021-06-22 ENCOUNTER — Encounter (HOSPITAL_COMMUNITY): Payer: Self-pay | Admitting: *Deleted

## 2021-06-22 DIAGNOSIS — Z5321 Procedure and treatment not carried out due to patient leaving prior to being seen by health care provider: Secondary | ICD-10-CM | POA: Diagnosis not present

## 2021-06-22 DIAGNOSIS — R079 Chest pain, unspecified: Secondary | ICD-10-CM | POA: Diagnosis not present

## 2021-06-22 DIAGNOSIS — R531 Weakness: Secondary | ICD-10-CM | POA: Diagnosis not present

## 2021-06-22 DIAGNOSIS — R600 Localized edema: Secondary | ICD-10-CM | POA: Diagnosis not present

## 2021-06-22 DIAGNOSIS — I10 Essential (primary) hypertension: Secondary | ICD-10-CM | POA: Diagnosis not present

## 2021-06-22 LAB — BASIC METABOLIC PANEL
Anion gap: 9 (ref 5–15)
BUN: 13 mg/dL (ref 8–23)
CO2: 24 mmol/L (ref 22–32)
Calcium: 9.8 mg/dL (ref 8.9–10.3)
Chloride: 105 mmol/L (ref 98–111)
Creatinine, Ser: 0.84 mg/dL (ref 0.44–1.00)
GFR, Estimated: >60 mL/min (ref >60)
Glucose, Bld: 99 mg/dL (ref 70–99)
Potassium: 3.9 mmol/L (ref 3.5–5.1)
Sodium: 138 mmol/L (ref 135–145)

## 2021-06-22 LAB — CBC
HCT: 37.4 % (ref 36.0–46.0)
Hemoglobin: 11.9 g/dL — ABNORMAL LOW (ref 12.0–15.0)
MCH: 28.6 pg (ref 26.0–34.0)
MCHC: 31.8 g/dL (ref 30.0–36.0)
MCV: 89.9 fL (ref 80.0–100.0)
Platelets: 203 10*3/uL (ref 150–400)
RBC: 4.16 MIL/uL (ref 3.87–5.11)
RDW: 13.7 % (ref 11.5–15.5)
WBC: 3.8 10*3/uL — ABNORMAL LOW (ref 4.0–10.5)
nRBC: 0 % (ref 0.0–0.2)

## 2021-06-22 LAB — TROPONIN I (HIGH SENSITIVITY)
Troponin I (High Sensitivity): 25 ng/L — ABNORMAL HIGH (ref <18)
Troponin I (High Sensitivity): 23 ng/L — ABNORMAL HIGH (ref <18)

## 2021-06-22 NOTE — ED Provider Notes (Signed)
Emergency Medicine Provider Triage Evaluation Note  Claudia [name] , a 87 y.o. female  was evaluated in triage.  Pt complains of some weakness, swelling, chest pain today, similar symptoms to yesterday. Chest pain PTA, not present on exam. Patient was offered diuresis yesterday but refused at that time -- was unable to follow up with her PCP today to discuss, and has ongoing symptoms. Denies nausea, vomiting, diaphoresis.  Review of Systems  Positive: As above Negative: As above  Physical Exam  BP (!) 186/84 (BP Location: Left Arm)    Pulse 65    Temp 98.9 F (37.2 C) (Oral)    Resp 18    SpO2 100%  Gen:   Awake, no distress   Resp:  Normal effort  MSK:   Moves extremities without difficulty  Other:  No TTP abdomen  Medical Decision Making  Medically screening exam initiated at 2:34 PM.  Appropriate orders placed.  Claudia [name] was informed that the remainder of the evaluation will be completed by another provider, this initial triage assessment does not replace that evaluation, and the importance of remaining in the ED until their evaluation is complete.  Workup initiated   Anselmo Pickler, PA-C 06/22/21 [place], [place], DO 06/22/21 [phone]

## 2021-06-22 NOTE — ED Triage Notes (Signed)
Reports being seen here yesterday for same, felt good this am and then had return of swelling to her feet and legs, had difficult and weakness when ambulating. Denies CP or SOB.

## 2021-06-22 NOTE — ED Notes (Signed)
Patient states they are leaving

## 2021-06-24 ENCOUNTER — Other Ambulatory Visit: Payer: Self-pay | Admitting: Internal Medicine

## 2021-06-24 DIAGNOSIS — M47817 Spondylosis without myelopathy or radiculopathy, lumbosacral region: Secondary | ICD-10-CM

## 2021-06-24 DIAGNOSIS — M170 Bilateral primary osteoarthritis of knee: Secondary | ICD-10-CM

## 2021-06-24 DIAGNOSIS — M4802 Spinal stenosis, cervical region: Secondary | ICD-10-CM

## 2021-06-24 DIAGNOSIS — M961 Postlaminectomy syndrome, not elsewhere classified: Secondary | ICD-10-CM

## 2021-06-24 DIAGNOSIS — G8929 Other chronic pain: Secondary | ICD-10-CM

## 2021-06-24 MED ORDER — FENTANYL 50 MCG/HR TD PT72: 1.0000 | MEDICATED_PATCH | TRANSDERMAL | 0 refills | Status: DC

## 2021-06-24 NOTE — Telephone Encounter (Signed)
Lauren w/ [facility] stated patient called stating she only receive 7 fentaNYL ([place]) 50 MCG/Hr   Caller states patient received 2 unopened boxes of 5 patches  Caller states patient has a rx for 3 patches, caller requesting a new rx for 5 patches due to patches coming 5 per box and not wanting to waste 2 patches  Pharmacy number [phone]

## 2021-06-25 ENCOUNTER — Ambulatory Visit (INDEPENDENT_AMBULATORY_CARE_PROVIDER_SITE_OTHER): Payer: PPO | Admitting: Internal Medicine

## 2021-06-25 ENCOUNTER — Other Ambulatory Visit: Payer: Self-pay

## 2021-06-25 ENCOUNTER — Encounter: Payer: Self-pay | Admitting: Internal Medicine

## 2021-06-25 ENCOUNTER — Other Ambulatory Visit: Payer: Self-pay | Admitting: Internal Medicine

## 2021-06-25 VITALS — BP 162/72 | HR 70 | Temp 98.3°F | Ht 70.0 in

## 2021-06-25 DIAGNOSIS — I10 Essential (primary) hypertension: Secondary | ICD-10-CM

## 2021-06-25 DIAGNOSIS — M961 Postlaminectomy syndrome, not elsewhere classified: Secondary | ICD-10-CM

## 2021-06-25 DIAGNOSIS — M170 Bilateral primary osteoarthritis of knee: Secondary | ICD-10-CM

## 2021-06-25 DIAGNOSIS — M47817 Spondylosis without myelopathy or radiculopathy, lumbosacral region: Secondary | ICD-10-CM

## 2021-06-25 DIAGNOSIS — M4802 Spinal stenosis, cervical region: Secondary | ICD-10-CM | POA: Diagnosis not present

## 2021-06-25 DIAGNOSIS — I480 Paroxysmal atrial fibrillation: Secondary | ICD-10-CM | POA: Diagnosis not present

## 2021-06-25 MED ORDER — TORSEMIDE 20 MG PO TABS: 20.0000 mg | ORAL_TABLET | Freq: Every day | ORAL | 1 refills | Status: DC

## 2021-06-25 NOTE — Patient Instructions (Signed)

## 2021-06-25 NOTE — Progress Notes (Addendum)
Subjective:  Patient ID: Claudia [name], female    DOB: [birth year]  Age: 87 y.o. MRN: [id number]  CC: Back Pain and Hypertension  This visit occurred during the SARS-CoV-2 public health emergency.  Safety protocols were in place, including screening questions prior to the visit, additional usage of staff PPE, and extensive cleaning of exam room while observing appropriate contact time as indicated for disinfecting solutions.    HPI EMALINE KARNES presents for f/up -  Hi Dr. Ronnald Ramp,   I took care of your patient in the emergency department at [facility] [place] today.  She was complaining of leg swelling, general fatigue.  She also appears have chronic back pain that she is having weakness in the left leg.  Blood test and exam are consistent with edema and some pulmonary congestion, perhaps some very mild congestive heart failure.  I think she will benefit from diuretic.  I started her on Lasix for 15 days and advised that she follow-up with you. BNP in 200's.   She was also complaining of worsening back pain and feels that her left leg is heavier than normal.  She was asking about MRIs of the spine.  I advised that she talk to your office about this as an outpatient.   Best regards,  Myrtie Cruise, MD  [facility]    She was recently seen in the emergency room after a fall.  She complained of worsening low back pain and lower extremity edema.  Her work-up was negative for fracture.  She had a slightly elevated BNP and was started on furosemide.  She tells me she is no longer taking the statin or thiazide diuretic.  She tells me she was told to get an MRI done of her lower back to see if she has something that can be treated surgically.  She tells me the lower extremity edema has not improved much with the furosemide.  She has chronic unchanged shortness of breath but denies chest pain, palpitations, or diaphoresis.  Outpatient Medications Prior to Visit  Medication Sig Dispense Refill   cetirizine  (ZYRTEC) 10 MG tablet Take 1 tablet (10 mg total) by mouth daily. 90 tablet 1   diltiazem (CARDIZEM) 30 MG tablet TAKE 1 TABLET BY MOUTH EVERY 4 HOURS AS NEEDED FOR AFIB HEART RATE GREATER THAN 100 AS LONG AS BLOOD PRESSURE LESS THAN 100 (Patient taking differently: Take 30 mg by mouth 4 (four) times daily as needed (AFIB). Take when heart rate > 100, as long as BP is <100) 90 tablet 3   fentaNYL (DURAGESIC) 50 MCG/HR Place 1 patch onto the skin every 3 (three) days. 5 patch 0   gabapentin (NEURONTIN) 100 MG capsule Take 1 capsule (100 mg total) by mouth 3 (three) times daily. When necessary for neuropathy pain 90 capsule 3   HYDROcodone-acetaminophen (NORCO/VICODIN) 5-325 MG tablet TAKE ONE TABLET BY MOUTH EVERY 8 HOURS BEFORE A meal 90 tablet 0   rivaroxaban (XARELTO) 20 MG TABS tablet Take 1 tablet (20 mg total) by mouth daily with supper. 30 tablet 3   vitamin C (ASCORBIC ACID) 500 MG tablet Take 500 mg by mouth daily.     vitamin E 200 UNIT capsule Take 200 Units by mouth daily.     furosemide (LASIX) 20 MG tablet Take 1 tablet (20 mg total) by mouth daily for 15 days. 15 tablet 0   indapamide (LOZOL) 1.25 MG tablet Take 1.25 mg by mouth daily.     rosuvastatin (CRESTOR) 10 MG  tablet Take 10 mg by mouth daily.     No facility-administered medications prior to visit.    ROS Review of Systems  Constitutional: Negative.  Negative for diaphoresis and fatigue.  HENT: Negative.    Eyes: Negative.   Respiratory:  Positive for shortness of breath. Negative for cough, chest tightness and wheezing.   Cardiovascular:  Positive for leg swelling. Negative for chest pain and palpitations.  Gastrointestinal:  Negative for abdominal pain, constipation, diarrhea, nausea and vomiting.  Endocrine: Negative.   Genitourinary: Negative.   Musculoskeletal:  Positive for arthralgias and back pain. Negative for myalgias.  Allergic/Immunologic: Negative.   Neurological:  Positive for weakness. Negative for  dizziness.  Hematological:  Negative for adenopathy. Does not bruise/bleed easily.  Psychiatric/Behavioral: Negative.     Objective:  BP (!) 162/72 (BP Location: Right Arm, Patient Position: Sitting, Cuff Size: Large)    Pulse 70    Temp 98.3 F (36.8 C) (Oral)    Ht 5\' 10"  (1.778 m)    SpO2 96%    BMI 30.28 kg/m   BP Readings from Last 3 Encounters:  06/25/21 (!) 162/72  06/22/21 (!) 172/74  06/21/21 (!) 198/81    Wt Readings from Last 3 Encounters:  06/02/21 211 lb (95.7 kg)  05/25/21 204 lb 14.4 oz (92.9 kg)  05/05/21 205 lb (93 kg)    Physical Exam Vitals reviewed.  Constitutional:      Appearance: She is ill-appearing (she is in a wheelchair).  HENT:     Nose: Nose normal.     Mouth/Throat:     Mouth: Mucous membranes are moist.  Eyes:     General: No scleral icterus. Cardiovascular:     Rate and Rhythm: Normal rate and regular rhythm.     Heart sounds: No murmur heard. Pulmonary:     Breath sounds: No stridor. No wheezing, rhonchi or rales.  Abdominal:     General: Abdomen is flat.     Palpations: There is no mass.     Tenderness: There is no abdominal tenderness. There is no guarding.     Hernia: No hernia is present.  Musculoskeletal:        General: Normal range of motion.     Cervical back: Neck supple.     Right lower leg: 1+ Pitting Edema present.     Left lower leg: 1+ Pitting Edema present.  Lymphadenopathy:     Cervical: No cervical adenopathy.  Skin:    General: Skin is warm and dry.  Neurological:     General: No focal deficit present.  Psychiatric:        Mood and Affect: Mood normal.        Behavior: Behavior normal.    Lab Results  Component Value Date   WBC 3.8 (L) 06/22/2021   HGB 11.9 (L) 06/22/2021   HCT 37.4 06/22/2021   PLT 203 06/22/2021   GLUCOSE 99 06/22/2021   CHOL 148 05/05/2021   TRIG 85.0 05/05/2021   HDL 54.40 05/05/2021   LDLDIRECT 158.9 09/05/2012   LDLCALC 77 05/05/2021   ALT 23 06/21/2021   AST 28 06/21/2021    NA 138 06/22/2021   K 3.9 06/22/2021   CL 105 06/22/2021   CREATININE 0.84 06/22/2021   BUN 13 06/22/2021   CO2 24 06/22/2021   TSH 4.92 05/05/2021   INR 1.00 04/04/2017   HGBA1C 5.5 11/03/2020    DG Chest 2 View  Result Date: 06/22/2021 CLINICAL DATA:  Chest pain for 3  days, hypertension EXAM: CHEST - 2 VIEW COMPARISON:  06/21/2021 FINDINGS: Frontal and lateral views of the chest demonstrate an unremarkable cardiac silhouette. No airspace disease, effusion, or pneumothorax. Stable eventration left hemidiaphragm. No acute bony abnormalities. IMPRESSION: 1. No acute intrathoracic process. Electronically Signed   By: Randa Ngo M.D.   On: 06/22/2021 15:05    Assessment & Plan:   Azani was seen today for back pain and hypertension.  Diagnoses and all orders for this visit:  Essential hypertension, benign- Her blood pressure is not adequately well controlled and she has lower extremity edema.  Will try a loop diuretic that has better bioavailability. -     torsemide (DEMADEX) 20 MG tablet; Take 1 tablet (20 mg total) by mouth daily.  Spinal stenosis in cervical region -     MR Lumbar Spine Wo Contrast; Future  PAF (paroxysmal atrial fibrillation) ([place])- She is maintaining sinus rhythm.  Will continue the DOAC.   I have discontinued Westlynn Fifer. Hine's rosuvastatin, indapamide, and furosemide. I am also having her start on torsemide. Additionally, I am having her maintain her cetirizine, gabapentin, rivaroxaban, diltiazem, vitamin E, vitamin C, fentaNYL, and HYDROcodone-acetaminophen.  Meds ordered this encounter  Medications   torsemide (DEMADEX) 20 MG tablet    Sig: Take 1 tablet (20 mg total) by mouth daily.    Dispense:  90 tablet    Refill:  1     Follow-up: Return in about 6 months (around 12/24/2021).  [name], MD

## 2021-06-30 ENCOUNTER — Telehealth: Payer: Self-pay | Admitting: Internal Medicine

## 2021-06-30 ENCOUNTER — Telehealth: Payer: Self-pay | Admitting: Cardiovascular Disease

## 2021-06-30 NOTE — Telephone Encounter (Signed)
Spoke to pt who report Dr. Ronnald Ramp recently prescribed a fentanyl patch for chronic back pain but feels she developed a reaction. She state it caused her fingers to become numb, she was nauseous, and  it affected her breathing. She state she has since removed the patch, feeling a little better, and contacted Dr. Ronnald Ramp office. Pt state she just wanted to make Dr. Oval Linsey aware.

## 2021-06-30 NOTE — Telephone Encounter (Signed)
Pt c/o medication issue:  1. Name of Medication: fentaNYL (DURAGESIC) 50 MCG/HR  2. How are you currently taking this medication (dosage and times per day)? Not currently on pt.. took off on Sunday   3. Are you having a reaction (difficulty breathing--STAT)? Sob and numb fingers   4. What is your medication issue?  pt in hospital Christmas Day and yesterday. Pt was put on fentaNYL patches for her back.. pt states that her fingers are numb and she feels as if it may be affecting her breathing... she took the patch off but fingers are still numb and breathing is still not normal... please advise

## 2021-06-30 NOTE — Telephone Encounter (Signed)
No note needed 

## 2021-06-30 NOTE — Telephone Encounter (Signed)
Patient states on 1-01 she removed fentaNYL (DURAGESIC) 50 MCG/Hr patch  Patient states her fingers are numb and she feels nauseas   Patient states she thinks reaction is due to her directly touching patch w/o gloves  Patient requesting a call back

## 2021-07-01 ENCOUNTER — Emergency Department (HOSPITAL_COMMUNITY): Payer: PPO

## 2021-07-01 ENCOUNTER — Encounter (HOSPITAL_COMMUNITY): Payer: Self-pay | Admitting: Emergency Medicine

## 2021-07-01 ENCOUNTER — Emergency Department (HOSPITAL_BASED_OUTPATIENT_CLINIC_OR_DEPARTMENT_OTHER): Admit: 2021-07-01 | Discharge: 2021-07-01 | Disposition: A | Discharge status: Home / Self Care | Payer: PPO

## 2021-07-01 ENCOUNTER — Emergency Department (HOSPITAL_COMMUNITY)
Admission: EM | Admit: 2021-07-01 | Discharge: 2021-07-02 | Disposition: A | Discharge status: Home / Self Care | Payer: PPO | Attending: Emergency Medicine | Admitting: Emergency Medicine

## 2021-07-01 DIAGNOSIS — R609 Edema, unspecified: Secondary | ICD-10-CM

## 2021-07-01 DIAGNOSIS — I4891 Unspecified atrial fibrillation: Secondary | ICD-10-CM | POA: Insufficient documentation

## 2021-07-01 DIAGNOSIS — R600 Localized edema: Secondary | ICD-10-CM | POA: Insufficient documentation

## 2021-07-01 DIAGNOSIS — R0602 Shortness of breath: Secondary | ICD-10-CM | POA: Diagnosis not present

## 2021-07-01 DIAGNOSIS — M7989 Other specified soft tissue disorders: Secondary | ICD-10-CM | POA: Insufficient documentation

## 2021-07-01 DIAGNOSIS — I10 Essential (primary) hypertension: Secondary | ICD-10-CM | POA: Insufficient documentation

## 2021-07-01 DIAGNOSIS — Z7901 Long term (current) use of anticoagulants: Secondary | ICD-10-CM | POA: Insufficient documentation

## 2021-07-01 DIAGNOSIS — Z79899 Other long term (current) drug therapy: Secondary | ICD-10-CM | POA: Diagnosis not present

## 2021-07-01 LAB — BASIC METABOLIC PANEL
Anion gap: 9 (ref 5–15)
BUN: 17 mg/dL (ref 8–23)
CO2: 27 mmol/L (ref 22–32)
Calcium: 10.2 mg/dL (ref 8.9–10.3)
Chloride: 103 mmol/L (ref 98–111)
Creatinine, Ser: 0.84 mg/dL (ref 0.44–1.00)
GFR, Estimated: >60 mL/min (ref >60)
Glucose, Bld: 107 mg/dL — ABNORMAL HIGH (ref 70–99)
Potassium: 3.4 mmol/L — ABNORMAL LOW (ref 3.5–5.1)
Sodium: 139 mmol/L (ref 135–145)

## 2021-07-01 LAB — CBC WITH DIFFERENTIAL/PLATELET
Abs Immature Granulocytes: 0.01 10*3/uL (ref 0.00–0.07)
Basophils Absolute: 0 10*3/uL (ref 0.0–0.1)
Basophils Relative: 1 %
Eosinophils Absolute: 0.1 10*3/uL (ref 0.0–0.5)
Eosinophils Relative: 1 %
HCT: 39.3 % (ref 36.0–46.0)
Hemoglobin: 12.5 g/dL (ref 12.0–15.0)
Immature Granulocytes: 0 %
Lymphocytes Relative: 34 %
Lymphs Abs: 1.4 10*3/uL (ref 0.7–4.0)
MCH: 28.3 pg (ref 26.0–34.0)
MCHC: 31.8 g/dL (ref 30.0–36.0)
MCV: 88.9 fL (ref 80.0–100.0)
Monocytes Absolute: 0.5 10*3/uL (ref 0.1–1.0)
Monocytes Relative: 12 %
Neutro Abs: 2.1 10*3/uL (ref 1.7–7.7)
Neutrophils Relative %: 52 %
Platelets: 243 10*3/uL (ref 150–400)
RBC: 4.42 MIL/uL (ref 3.87–5.11)
RDW: 13.7 % (ref 11.5–15.5)
WBC: 4 10*3/uL (ref 4.0–10.5)
nRBC: 0 % (ref 0.0–0.2)

## 2021-07-01 LAB — BRAIN NATRIURETIC PEPTIDE: B Natriuretic Peptide: 89.3 pg/mL (ref 0.0–100.0)

## 2021-07-01 LAB — TROPONIN I (HIGH SENSITIVITY): Troponin I (High Sensitivity): 15 ng/L (ref <18)

## 2021-07-01 NOTE — ED Provider Triage Note (Signed)
Emergency Medicine Provider Triage Evaluation Note  Claudia [name] , a 88 y.o. female  was evaluated in triage.  Pt complains of lower extremity edema associated with shortness of breath. She has been taking Lasix with no relief. Left lower extremity worse than right. No history of blood clots. Admits to some intermittent chest pain.   Review of Systems  Positive: Lower extremity edema Negative: fever  Physical Exam  BP (!) 141/86    Pulse 63    Temp 98.4 F (36.9 C)    Resp 16    SpO2 100%  Gen:   Awake, no distress   Resp:  Normal effort  MSK:   Moves extremities without difficulty  Other:  Edema around ankles (L>R)  Medical Decision Making  Medically screening exam initiated at 4:47 PM.  Appropriate orders placed.  Claudia [name] was informed that the remainder of the evaluation will be completed by another provider, this initial triage assessment does not replace that evaluation, and the importance of remaining in the ED until their evaluation is complete.  Labs BNP CXR [place] to rule out DVT given asymmetry    Suzy Bouchard, PA-C 07/01/21 1648

## 2021-07-01 NOTE — Telephone Encounter (Signed)
Patient calling in  Patient says she is back in the hospital & wanted to make provider aware.. patient says she does not know what is wrong w/ her & she does not want them to release her until they figure out what is going on  Please call patient [phone]

## 2021-07-01 NOTE — ED Triage Notes (Signed)
Patient BIB [facility] from home for evaluation of lower extremity edema, patient states she was seen recently at [facility] for same on Christmas Day and was prescribed lasix. Patient states the edema has not improved but states that her shortness of breath with exertion has gotten worse.   BP 182/98 HR 70 NSR, hx afib SpO2 98% on room air

## 2021-07-01 NOTE — Progress Notes (Signed)
Bilateral lower extremity venous duplex has been completed. Preliminary results can be found in CV Proc through chart review.  Results were given to [facility] PA.  07/01/21 5:20 PM Claudia [name] RVT

## 2021-07-02 ENCOUNTER — Ambulatory Visit: Payer: PPO | Admitting: Physical Therapy

## 2021-07-02 LAB — TROPONIN I (HIGH SENSITIVITY): Troponin I (High Sensitivity): 15 ng/L (ref <18)

## 2021-07-02 NOTE — Discharge Instructions (Addendum)
Please use compression stockings to help reduce leg swelling. Also elevate the legs when sitting/laying down for long periods of time. Encourage mobilization as much as tolerated.  Continue taking the torsemide as prescribed and follow up with your primary care doctor.

## 2021-07-02 NOTE — ED Provider Notes (Signed)
[facility] EMERGENCY DEPARTMENT Provider Note   CSN: [id number] Arrival date & time: 07/01/21  1245     History  No chief complaint on file.   Claudia [name] is a 88 y.o. female with a pertinent history of hypertension and A. fib on anticoagulation, presented with complaint of bilateral lower extremity edema.  Patient presented to the ED on December 25 with similar symptoms and was prescribed furosemide.  Patient then followed up with her PCP and was prescribed torsemide.  Which she began taking this past Saturday.  Patient states that the swelling persisted which prompted ED visit today.  Patient states she did not take her medication yesterday because she was in the waiting room at the hospital.  She denies shortness of breath, PND, cough, fever, chills, lower extremity numbness tingling or pain.  HPI     Home Medications Prior to Admission medications   Medication Sig Start Date End Date Taking? Authorizing Provider  cetirizine (ZYRTEC) 10 MG tablet Take 1 tablet (10 mg total) by mouth daily. 08/16/16   Janith Lima, MD  diltiazem (CARDIZEM) 30 MG tablet TAKE 1 TABLET BY MOUTH EVERY 4 HOURS AS NEEDED FOR AFIB HEART RATE GREATER THAN 100 AS LONG AS BLOOD PRESSURE LESS THAN 100 Patient taking differently: Take 30 mg by mouth 4 (four) times daily as needed (AFIB). Take when heart rate > 100, as long as BP is <100 06/10/21   Skeet Latch, MD  fentaNYL (DURAGESIC) 50 MCG/HR Place 1 patch onto the skin every 3 (three) days. 06/24/21   Janith Lima, MD  gabapentin (NEURONTIN) 100 MG capsule Take 1 capsule (100 mg total) by mouth 3 (three) times daily. When necessary for neuropathy pain 05/18/21   Newt Minion, MD  HYDROcodone-acetaminophen (NORCO/VICODIN) 5-325 MG tablet TAKE ONE TABLET BY MOUTH EVERY 8 HOURS BEFORE A meal 06/25/21   Janith Lima, MD  rivaroxaban (XARELTO) 20 MG TABS tablet Take 1 tablet (20 mg total) by mouth daily with supper. 05/26/21    Supple, Megan E, RPH-CPP  torsemide (DEMADEX) 20 MG tablet Take 1 tablet (20 mg total) by mouth daily. 06/25/21   Janith Lima, MD  vitamin C (ASCORBIC ACID) 500 MG tablet Take 500 mg by mouth daily.    [provider]  vitamin E 200 UNIT capsule Take 200 Units by mouth daily.    [provider]      Allergies    Lipitor [atorvastatin] and Trileptal [oxcarbazepine]    Review of Systems   Review of Systems  Constitutional:  Negative for chills and fever.  Eyes:  Negative for visual disturbance.  Respiratory:  Negative for cough and shortness of breath.   Cardiovascular:  Negative for chest pain.  Neurological:  Negative for dizziness and light-headedness.   Physical Exam Updated Vital Signs BP (!) 168/77 (BP Location: Right Arm)    Pulse (!) 58    Temp 97.8 F (36.6 C) (Oral)    Resp 20    SpO2 100%  Physical Exam Constitutional:      General: She is awake. She is not in acute distress. HENT:     Head: Normocephalic and atraumatic.  Cardiovascular:     Rate and Rhythm: Normal rate.     Heart sounds: Normal heart sounds.  Pulmonary:     Effort: Pulmonary effort is normal.     Breath sounds: Normal breath sounds and air entry.  Abdominal:     General: Abdomen is flat.  Musculoskeletal:     Right lower leg: 3+ Edema present.     Left lower leg: 3+ Edema present.  Skin:    General: Skin is warm and dry.  Neurological:     General: No focal deficit present.     Mental Status: She is alert.  Psychiatric:        Behavior: Behavior normal. Behavior is cooperative.    ED Results / Procedures / Treatments   Labs (all labs ordered are listed, but only abnormal results are displayed) Labs Reviewed  BASIC METABOLIC PANEL - Abnormal; Notable for the following components:      Result Value   Potassium 3.4 (*)    Glucose, Bld 107 (*)    All other components within normal limits  CBC WITH DIFFERENTIAL/PLATELET  BRAIN NATRIURETIC PEPTIDE  TROPONIN I (HIGH  SENSITIVITY)  TROPONIN I (HIGH SENSITIVITY)    EKG None  Radiology DG Chest 2 View  Result Date: 07/01/2021 CLINICAL DATA:  Shortness of breath EXAM: CHEST - 2 VIEW COMPARISON:  Chest x-ray 06/22/2021 FINDINGS: Heart is mildly enlarged. Tortuous thoracic aorta with calcified plaques. Pulmonary vasculature within normal limits. No focal consolidation identified. No pleural effusion or pneumothorax. Stable mild eventration of the anterior left hemidiaphragm. IMPRESSION: Chronic changes with no acute process identified. Electronically Signed   By: Ofilia Neas M.D.   On: 07/01/2021 13:33   VAS [place] LOWER EXTREMITY VENOUS (DVT) (ONLY [facility] & [facility])  Result Date: 07/01/2021  Lower Venous DVT Study Patient Name:  BETHEL SIROIS  Date of Exam:   07/01/2021 Medical Rec #: [id number]           Accession #:    [id number] Date of Birth: Nov 25, 1933           Patient Gender: F Patient Age:   5 years Exam Location:  [facility] Procedure:      VAS [place] LOWER EXTREMITY VENOUS (DVT) Referring Phys: Charmaine Downs --------------------------------------------------------------------------------  Indications: Edema.  Risk Factors: None identified. Limitations: Poor ultrasound/tissue interface and patient positioning. Comparison Study: No prior studies. Performing Technologist: Oliver Hum RVT  Examination Guidelines: A complete evaluation includes B-mode imaging, spectral Doppler, color Doppler, and power Doppler as needed of all accessible portions of each vessel. Bilateral testing is considered an integral part of a complete examination. Limited examinations for reoccurring indications may be performed as noted. The reflux portion of the exam is performed with the patient in reverse Trendelenburg.  +---------+---------------+---------+-----------+----------+--------------+  RIGHT     Compressibility Phasicity Spontaneity Properties Thrombus Aging   +---------+---------------+---------+-----------+----------+--------------+  CFV       Full            Yes       Yes                                    +---------+---------------+---------+-----------+----------+--------------+  SFJ       Full                                                             +---------+---------------+---------+-----------+----------+--------------+  FV Prox   Full                                                             +---------+---------------+---------+-----------+----------+--------------+  FV Mid    Full                                                             +---------+---------------+---------+-----------+----------+--------------+  FV Distal Full                                                             +---------+---------------+---------+-----------+----------+--------------+  PFV       Full                                                             +---------+---------------+---------+-----------+----------+--------------+  POP       Full            Yes       Yes                                    +---------+---------------+---------+-----------+----------+--------------+  PTV       Full                                                             +---------+---------------+---------+-----------+----------+--------------+  PERO      Full                                                             +---------+---------------+---------+-----------+----------+--------------+   +---------+---------------+---------+-----------+----------+--------------+  LEFT      Compressibility Phasicity Spontaneity Properties Thrombus Aging  +---------+---------------+---------+-----------+----------+--------------+  CFV       Full            Yes       Yes                                    +---------+---------------+---------+-----------+----------+--------------+  SFJ       Full                                                              +---------+---------------+---------+-----------+----------+--------------+  FV Prox   Full                                                             +---------+---------------+---------+-----------+----------+--------------+  FV Mid    Full                                                             +---------+---------------+---------+-----------+----------+--------------+  FV Distal Full                                                             +---------+---------------+---------+-----------+----------+--------------+  PFV       Full                                                             +---------+---------------+---------+-----------+----------+--------------+  POP       Full            Yes       Yes                                    +---------+---------------+---------+-----------+----------+--------------+  PTV       Full                                                             +---------+---------------+---------+-----------+----------+--------------+  PERO      Full                                                             +---------+---------------+---------+-----------+----------+--------------+     Summary: RIGHT: - There is no evidence of deep vein thrombosis in the lower extremity.  - No cystic structure found in the popliteal fossa.  LEFT: - There is no evidence of deep vein thrombosis in the lower extremity.  - No cystic structure found in the popliteal fossa.  *See table(s) above for measurements and observations. Electronically signed by Harold Barban MD on 07/01/2021 at 11:30:10 PM.    Final     Procedures Procedures    Medications Ordered in ED Medications - No data to display  ED Course/ Medical Decision Making/ A&P                           Medical Decision Making  Peripheral edema Patient presents with bilateral lower extremity edema.  Patient was previously seen in the ED for similar symptoms.  Patient was started on torsemide by her PCP.  Last dose Tuesday per  patient.  Patient states the edema persisted despite starting torsemide medication.  Initial labs: BNP, CBC, BMP were unremarkable tropes were flat.  EKG insignificant.  And  vascular studies of the bilateral lower extremities reveal no evidence of DVT.  On exam, patient was in no acute respiratory distress, lungs were clear on auscultation, presence of +3 bilateral lower extremity edema.  No concern for signs of emboli.  Patient will benefit from use of compression stockings.  Patient advised to continue taking torsemide and follow-up with PCP.        Final Clinical Impression(s) / ED Diagnoses Final diagnoses:  None    Rx / DC Orders ED Discharge Orders     None         Timothy Lasso, MD 07/02/21 1222    Isla Pence, MD 07/03/21 2137

## 2021-07-02 NOTE — ED Notes (Signed)
AVS provided to and discussed with patient. Pt verbalizes understanding of discharge instructions and denies any questions or concerns at this time. Pt taken to lobby via WC to wait for ride with spouse.

## 2021-07-05 ENCOUNTER — Ambulatory Visit
Admission: RE | Admit: 2021-07-05 | Discharge: 2021-07-05 | Disposition: A | Discharge status: Home / Self Care | Payer: PPO | Source: Ambulatory Visit | Attending: Internal Medicine | Admitting: Internal Medicine

## 2021-07-05 ENCOUNTER — Other Ambulatory Visit: Payer: Self-pay

## 2021-07-05 DIAGNOSIS — M48061 Spinal stenosis, lumbar region without neurogenic claudication: Secondary | ICD-10-CM | POA: Diagnosis not present

## 2021-07-05 DIAGNOSIS — M545 Low back pain, unspecified: Secondary | ICD-10-CM | POA: Diagnosis not present

## 2021-07-05 DIAGNOSIS — M2578 Osteophyte, vertebrae: Secondary | ICD-10-CM | POA: Diagnosis not present

## 2021-07-05 DIAGNOSIS — R29898 Other symptoms and signs involving the musculoskeletal system: Secondary | ICD-10-CM | POA: Diagnosis not present

## 2021-07-05 DIAGNOSIS — M4802 Spinal stenosis, cervical region: Secondary | ICD-10-CM

## 2021-07-07 ENCOUNTER — Ambulatory Visit: Payer: PPO | Admitting: Family

## 2021-07-09 ENCOUNTER — Ambulatory Visit (INDEPENDENT_AMBULATORY_CARE_PROVIDER_SITE_OTHER): Payer: PPO | Admitting: Internal Medicine

## 2021-07-09 ENCOUNTER — Other Ambulatory Visit: Payer: Self-pay

## 2021-07-09 VITALS — BP 124/78 | HR 84 | Temp 97.9°F | Resp 16 | Ht 70.0 in

## 2021-07-09 DIAGNOSIS — M4802 Spinal stenosis, cervical region: Secondary | ICD-10-CM

## 2021-07-09 DIAGNOSIS — M961 Postlaminectomy syndrome, not elsewhere classified: Secondary | ICD-10-CM

## 2021-07-09 DIAGNOSIS — M47817 Spondylosis without myelopathy or radiculopathy, lumbosacral region: Secondary | ICD-10-CM

## 2021-07-09 DIAGNOSIS — I10 Essential (primary) hypertension: Secondary | ICD-10-CM

## 2021-07-09 NOTE — Progress Notes (Signed)
Subjective:  Patient ID: Claudia [name], female    DOB: May 25, 1934  Age: 88 y.o. MRN: [id number]  CC: Back Pain and Hypertension  This visit occurred during the SARS-CoV-2 public health emergency.  Safety protocols were in place, including screening questions prior to the visit, additional usage of staff PPE, and extensive cleaning of exam room while observing appropriate contact time as indicated for disinfecting solutions.    HPI Claudia [name] presents for f/up -   She continues to c/o LBP with radiation. Her recent MRI showed left severe foraminal stenosis. She wants to see a Psychologist, sport and exercise.  Outpatient Medications Prior to Visit  Medication Sig Dispense Refill   cetirizine (ZYRTEC) 10 MG tablet Take 1 tablet (10 mg total) by mouth daily. 90 tablet 1   diltiazem (CARDIZEM) 30 MG tablet TAKE 1 TABLET BY MOUTH EVERY 4 HOURS AS NEEDED FOR AFIB HEART RATE GREATER THAN 100 AS LONG AS BLOOD PRESSURE LESS THAN 100 (Patient taking differently: Take 30 mg by mouth 4 (four) times daily as needed (AFIB). Take when heart rate > 100, as long as BP is <100) 90 tablet 3   gabapentin (NEURONTIN) 100 MG capsule Take 1 capsule (100 mg total) by mouth 3 (three) times daily. When necessary for neuropathy pain 90 capsule 3   HYDROcodone-acetaminophen (NORCO/VICODIN) 5-325 MG tablet TAKE ONE TABLET BY MOUTH EVERY 8 HOURS BEFORE A meal 90 tablet 0   rivaroxaban (XARELTO) 20 MG TABS tablet Take 1 tablet (20 mg total) by mouth daily with supper. 30 tablet 3   torsemide (DEMADEX) 20 MG tablet Take 1 tablet (20 mg total) by mouth daily. 90 tablet 1   vitamin C (ASCORBIC ACID) 500 MG tablet Take 500 mg by mouth daily.     vitamin E 200 UNIT capsule Take 200 Units by mouth daily.     fentaNYL (DURAGESIC) 50 MCG/HR Place 1 patch onto the skin every 3 (three) days. 5 patch 0   No facility-administered medications prior to visit.    ROS Review of Systems  Constitutional:  Negative for diaphoresis and fatigue.   HENT: Negative.    Eyes: Negative.   Respiratory:  Negative for cough, chest tightness, shortness of breath and wheezing.   Cardiovascular:  Negative for chest pain, palpitations and leg swelling.  Gastrointestinal:  Negative for abdominal pain, diarrhea and vomiting.  Endocrine: Negative.   Genitourinary: Negative.  Negative for difficulty urinating.  Musculoskeletal:  Positive for arthralgias, back pain and gait problem. Negative for joint swelling.  Skin: Negative.   Neurological:  Negative for dizziness, weakness and light-headedness.  Hematological:  Negative for adenopathy. Does not bruise/bleed easily.  Psychiatric/Behavioral: Negative.     Objective:  BP 124/78 (BP Location: Left Arm, Patient Position: Sitting, Cuff Size: Large)    Pulse 84    Temp 97.9 F (36.6 C) (Oral)    Ht 5\' 10"  (1.778 m)    SpO2 99%    BMI 30.28 kg/m   BP Readings from Last 3 Encounters:  07/09/21 124/78  07/02/21 (!) 164/72  06/25/21 (!) 162/72    Wt Readings from Last 3 Encounters:  06/02/21 211 lb (95.7 kg)  05/25/21 204 lb 14.4 oz (92.9 kg)  05/05/21 205 lb (93 kg)    Physical Exam Vitals reviewed.  HENT:     Mouth/Throat:     Mouth: Mucous membranes are moist.  Eyes:     General: No scleral icterus.    Conjunctiva/sclera: Conjunctivae normal.  Cardiovascular:  Rate and Rhythm: Normal rate and regular rhythm.     Pulses: Normal pulses.     Heart sounds: No murmur heard. Pulmonary:     Effort: Pulmonary effort is normal.     Breath sounds: No stridor. No wheezing, rhonchi or rales.  Abdominal:     General: Abdomen is flat.     Palpations: There is no mass.     Tenderness: There is no abdominal tenderness. There is no guarding.  Musculoskeletal:     Cervical back: Neck supple.  Lymphadenopathy:     Cervical: No cervical adenopathy.  Skin:    General: Skin is warm and dry.  Neurological:     Mental Status: She is alert.     Comments: In a wheelchair  Psychiatric:         Mood and Affect: Mood normal.    Lab Results  Component Value Date   WBC 4.0 07/01/2021   HGB 12.5 07/01/2021   HCT 39.3 07/01/2021   PLT 243 07/01/2021   GLUCOSE 107 (H) 07/01/2021   CHOL 148 05/05/2021   TRIG 85.0 05/05/2021   HDL 54.40 05/05/2021   LDLDIRECT 158.9 09/05/2012   LDLCALC 77 05/05/2021   ALT 23 06/21/2021   AST 28 06/21/2021   NA 139 07/01/2021   K 3.4 (L) 07/01/2021   CL 103 07/01/2021   CREATININE 0.84 07/01/2021   BUN 17 07/01/2021   CO2 27 07/01/2021   TSH 4.92 05/05/2021   INR 1.00 04/04/2017   HGBA1C 5.5 11/03/2020    MR Lumbar Spine Wo Contrast  Result Date: 07/05/2021 CLINICAL DATA:  Low back pain and left leg weakness since fall 3 weeks ago. History of prior surgery, most recently in 2014. EXAM: MRI LUMBAR SPINE WITHOUT CONTRAST TECHNIQUE: Multiplanar, multisequence MR imaging of the lumbar spine was performed. No intravenous contrast was administered. COMPARISON:  Lumbar spine x-rays dated May 18, 2021. MRI lumbar spine dated February 27, 2013. FINDINGS: Segmentation:  Standard. Alignment: New mild retrolisthesis at T12-L1 and L1-L2. Unchanged mild retrolisthesis at L3-L4. Unchanged levoscoliosis. Vertebrae: Similar diffusely heterogeneous marrow signal without focal lesion. Prior L4-L5 PLIF. No acute fracture or evidence of discitis. Conus medullaris and cauda equina: Conus extends to the L2 level. Conus and cauda equina appear normal. Paraspinal and other soft tissues: Negative. Disc levels: T10-T11: Disc bulging, endplate spurring, and facet arthropathy contribute to mild spinal canal and severe left neuroforaminal stenosis. T11-12: Unchanged mild disc bulging without stenosis. T12-L1: Unchanged mild disc bulging and endplate spurring eccentric to the left. No stenosis. L1-L2: Unchanged mild disc bulging and bilateral facet arthropathy. No stenosis. L2-L3: Unchanged right eccentric disc bulging with right far lateral disc osteophyte complex. Unchanged  moderate right and mild left facet arthropathy. Unchanged mild spinal canal and right lateral recess stenosis. Unchanged mild right neuroforaminal stenosis. No left neuroforaminal stenosis. L3-L4: Progressive mild disc bulging and moderate bilateral facet arthropathy with ligamentum flavum hypertrophy. Unchanged mild right-greater-than-left lateral recess stenosis. Unchanged mild right neuroforaminal stenosis. No spinal canal or left neuroforaminal stenosis. L4-L5: Prior posterior decompression and PLIF. No residual stenosis. L5-S1: Unchanged small broad-based posterior disc protrusion. Progressive moderate bilateral facet arthropathy. No stenosis. IMPRESSION: 1. No acute abnormality. Prior L4-L5 PLIF without residual stenosis. 2. Progressive spondylosis at L3-L4 and L5-S1 without high-grade stenosis or impingement. 3. Severe left neuroforaminal stenosis at T10-T11. Electronically Signed   By: Titus Dubin M.D.   On: 07/05/2021 18:12    Assessment & Plan:   Jaziah was seen today for back  pain and hypertension.  Diagnoses and all orders for this visit:  Essential hypertension, benign- Her BP is adequately well controlled.  Postlaminectomy syndrome, lumbar region -     Ambulatory referral to [place]: Ambulatory referral to Orthopedic Surgery -     Ambulatory referral to Orthopedic Surgery  Spinal stenosis in cervical region -     Ambulatory referral to Home Health  Lumbosacral spondylosis without myelopathy -     Ambulatory referral to [place]: Ambulatory referral to Orthopedic Surgery -     Ambulatory referral to Orthopedic Surgery   I have discontinued Cing . Perko's fentaNYL. I am also having her maintain her cetirizine, gabapentin, rivaroxaban, diltiazem, vitamin E, vitamin C, HYDROcodone-acetaminophen, and torsemide.  No orders of the defined types were placed in this encounter.    Follow-up: No follow-ups on file.  [name], MD

## 2021-07-10 ENCOUNTER — Encounter: Payer: Self-pay | Admitting: Internal Medicine

## 2021-07-10 ENCOUNTER — Telehealth: Payer: Self-pay | Admitting: Internal Medicine

## 2021-07-10 NOTE — Telephone Encounter (Signed)
Patient calling in  Says the referral provider put in was suppose to go to Dr. Lynann Bologna @ [facility] & not Dr. Sharol Given @ [facility]  Please send new referral to Dr. Lynann Bologna & patient is requesting someone let her know when this is done [phone]

## 2021-07-13 NOTE — Telephone Encounter (Signed)
Patient states Dr. Lynann Bologna can not see her for 2 weeks  Patient requesting referral to Dr. Lorin Mercy @ [facility] [address], [place], [place] [address] instead of Dr. Lynann Bologna

## 2021-07-14 ENCOUNTER — Other Ambulatory Visit: Payer: Self-pay | Admitting: Internal Medicine

## 2021-07-14 DIAGNOSIS — G8929 Other chronic pain: Secondary | ICD-10-CM

## 2021-07-14 DIAGNOSIS — M545 Low back pain, unspecified: Secondary | ICD-10-CM

## 2021-07-15 ENCOUNTER — Other Ambulatory Visit: Payer: Self-pay | Admitting: Internal Medicine

## 2021-07-15 DIAGNOSIS — E7800 Pure hypercholesterolemia, unspecified: Secondary | ICD-10-CM

## 2021-07-20 ENCOUNTER — Ambulatory Visit: Payer: PPO | Admitting: Orthopedic Surgery

## 2021-07-23 ENCOUNTER — Other Ambulatory Visit: Payer: Self-pay | Admitting: Internal Medicine

## 2021-07-23 DIAGNOSIS — M170 Bilateral primary osteoarthritis of knee: Secondary | ICD-10-CM

## 2021-07-23 DIAGNOSIS — M47817 Spondylosis without myelopathy or radiculopathy, lumbosacral region: Secondary | ICD-10-CM

## 2021-07-23 DIAGNOSIS — M961 Postlaminectomy syndrome, not elsewhere classified: Secondary | ICD-10-CM

## 2021-07-28 ENCOUNTER — Other Ambulatory Visit: Payer: Self-pay

## 2021-07-28 ENCOUNTER — Ambulatory Visit: Payer: Self-pay

## 2021-07-28 ENCOUNTER — Ambulatory Visit (INDEPENDENT_AMBULATORY_CARE_PROVIDER_SITE_OTHER): Payer: PPO | Admitting: Orthopaedic Surgery

## 2021-07-28 ENCOUNTER — Encounter: Payer: Self-pay | Admitting: Orthopaedic Surgery

## 2021-07-28 VITALS — BP 135/77 | HR 60

## 2021-07-28 DIAGNOSIS — M47817 Spondylosis without myelopathy or radiculopathy, lumbosacral region: Secondary | ICD-10-CM | POA: Diagnosis not present

## 2021-07-28 DIAGNOSIS — M542 Cervicalgia: Secondary | ICD-10-CM

## 2021-07-28 DIAGNOSIS — M4802 Spinal stenosis, cervical region: Secondary | ICD-10-CM | POA: Diagnosis not present

## 2021-07-28 DIAGNOSIS — M545 Low back pain, unspecified: Secondary | ICD-10-CM | POA: Diagnosis not present

## 2021-07-28 NOTE — Progress Notes (Signed)
Office Visit Note   Patient: Claudia [name]           Date of Birth: May 09, 1934           MRN: [id number] Visit Date: 07/28/2021              Requested by: Janith Lima, MD [address] [place],  [place] [address] PCP: Janith Lima, MD   Assessment & Plan: Visit Diagnoses:  1. Neck pain   2. Low back pain, unspecified back pain laterality, unspecified chronicity, unspecified whether sciatica present   3. Lumbosacral spondylosis without myelopathy   4. Spinal stenosis in cervical region     Plan: Patient with known cervical spondylosis seen on CT scan back in 2017.  She has had progressive symptoms now with bilateral C7 weakness.  Previous MRI 2017 showed some flattening of the cord at C4-5 C5-6 and lesser degree C6-7.  We will set patient up for some physical therapy to work on walking.  Left trochanteric injection performed which she tolerated well with improvement in her symptoms.  MRI scan cervical spine and evaluate her for cord compression with her lower extremity weakness problems.  Follow-up after cervical MRI.  I gave her a copy of her lumbar MRI report and pathophysiology discussed.  Follow-Up Instructions: No follow-ups on file.   Orders:  Orders Placed This Encounter  Procedures   XR Cervical Spine 2 or 3 views   MR Cervical Spine w/o contrast   Ambulatory referral to Physical Therapy   No orders of the defined types were placed in this encounter.     Procedures: No procedures performed   Clinical Data: No additional findings.   Subjective: Chief Complaint  Patient presents with   Lower Back - Follow-up    HPI 88 year old female has seen Dr. Did in the past is here after a fall in December with increased back pain.  Patient's been currently ambulating in a wheelchair.  Patient states he has been unable to walk due to back pain.  Patient's had numbness down her left side.  Bilateral numbness.  Pain is worse over the left lateral trochanter  region.  She states she was walking easier prior to the fall.  Patient is already on chronic opioids hydrocodone 5/325    #90 tablets monthly.  Review of Systems all other systems noncontributory HPI no bowel or bladder symptoms she denies fever or chills.  Past history of L4-5 interbody fusion with some progressive spondylosis L3-4 and L5-S1 adjacent to a solid fusion by MRI 07/05/2021.   Objective: Vital Signs: BP 135/77 (BP Location: Right Arm, Patient Position: Sitting, Cuff Size: Large)    Pulse 60    SpO2 98%   Physical Exam Constitutional:      Appearance: She is well-developed.  HENT:     Head: Normocephalic.     Right Ear: External ear normal.     Left Ear: External ear normal. There is no impacted cerumen.  Eyes:     Pupils: Pupils are equal, round, and reactive to light.  Neck:     Thyroid: No thyromegaly.     Trachea: No tracheal deviation.  Cardiovascular:     Rate and Rhythm: Normal rate.  Pulmonary:     Effort: Pulmonary effort is normal.  Abdominal:     Palpations: Abdomen is soft.  Musculoskeletal:     Cervical back: No rigidity.  Skin:    General: Skin is warm and dry.  Neurological:  Mental Status: She is alert and oriented to person, place, and time.  Psychiatric:        Behavior: Behavior normal.    Ortho Exam no lower extremity hyperreflexia.  Some pain with straight leg raising.  Exquisite tenderness over the left greater trochanter.  Some brachial plexus tenderness and bilateral C7 weakness worse on the left than right and also interosseous weakness worse on the left than right.  No biceps or deltoid weakness.  Specialty Comments:  No specialty comments available.  Imaging: No results found.   PMFS History: Patient Active Problem List   Diagnosis Date Noted   Encounter for general adult medical examination with abnormal findings 05/05/2021   PAF (paroxysmal atrial fibrillation) ([facility]) 11/03/2020   Stage 3b chronic kidney disease ([facility])  05/06/2020   Chronic left-sided low back pain without sciatica 12/25/2018   Spinal stenosis in cervical region 05/12/2016   Therapeutic opioid-induced constipation (OIC) 01/01/2016   Chronic idiopathic constipation 09/11/2013   Lumbosacral spondylosis without myelopathy 04/09/2013   Postlaminectomy syndrome, lumbar region 04/09/2013   Anticoagulation management encounter 02/01/2012   Neuropathy, peripheral 08/04/2011   Pure hypercholesterolemia 08/04/2011   Essential hypertension, benign 08/04/2011   Hypothyroidism 08/04/2011   DJD (degenerative joint disease) of knee 08/04/2011   Gout 08/04/2011   Past Medical History:  Diagnosis Date   Abnormality of gait    Brachial neuritis or radiculitis NOS    Degeneration of lumbar or lumbosacral intervertebral disc    Essential hypertension, benign    Gouty arthropathy    Lumbago    Obesity    Osteoarthritis    Pure hypercholesterolemia    Unspecified hypothyroidism     Family History  Problem Relation Age of Onset   Heart disease Father    Heart attack Father    Hypertension Mother    Alzheimer's disease Mother    Diabetes Sister    Cirrhosis Brother    Diabetes Sister    Diabetes Sister    Diabetes Brother    Heart attack Brother    Cancer Neg Hx    Kidney disease Neg Hx     Past Surgical History:  Procedure Laterality Date   ABDOMINAL HYSTERECTOMY  03/13/2010   BACK SURGERY     LUMBAR LAMINECTOMY  03/13/2010   right knee replacement  03/13/2010   Social History   Occupational History   Not on file  Tobacco Use   Smoking status: Never   Smokeless tobacco: Never  Substance and Sexual Activity   Alcohol use: No    Comment: social   Drug use: No   Sexual activity: Never

## 2021-08-06 ENCOUNTER — Telehealth: Payer: Self-pay | Admitting: Orthopaedic Surgery

## 2021-08-06 ENCOUNTER — Other Ambulatory Visit: Payer: Self-pay | Admitting: Orthopaedic Surgery

## 2021-08-06 MED ORDER — PREDNISONE 10 MG (21) PO TBPK: ORAL_TABLET | ORAL | 0 refills | Status: DC

## 2021-08-06 NOTE — Progress Notes (Signed)
Prednisone dose pack sent in .

## 2021-08-06 NOTE — Telephone Encounter (Signed)
Could you please work patient in tomorrow afternoon?

## 2021-08-06 NOTE — Telephone Encounter (Signed)
Pt called. States she is in a lot of pain and can not even walk with the walker. She wants to come in to be seen.   [phone]

## 2021-08-07 ENCOUNTER — Ambulatory Visit: Payer: PPO | Admitting: Orthopaedic Surgery

## 2021-08-07 ENCOUNTER — Encounter: Payer: Self-pay | Admitting: Orthopaedic Surgery

## 2021-08-07 ENCOUNTER — Other Ambulatory Visit: Payer: Self-pay

## 2021-08-07 VITALS — Ht 70.0 in | Wt 211.0 lb

## 2021-08-07 DIAGNOSIS — M4802 Spinal stenosis, cervical region: Secondary | ICD-10-CM

## 2021-08-13 ENCOUNTER — Ambulatory Visit: Payer: PPO | Admitting: Orthopedic Surgery

## 2021-08-14 ENCOUNTER — Ambulatory Visit
Admission: RE | Admit: 2021-08-14 | Discharge: 2021-08-14 | Disposition: A | Discharge status: Home / Self Care | Payer: PPO | Source: Ambulatory Visit | Attending: Orthopaedic Surgery | Admitting: Orthopaedic Surgery

## 2021-08-14 DIAGNOSIS — R200 Anesthesia of skin: Secondary | ICD-10-CM | POA: Diagnosis not present

## 2021-08-14 DIAGNOSIS — M542 Cervicalgia: Secondary | ICD-10-CM

## 2021-08-14 DIAGNOSIS — M4802 Spinal stenosis, cervical region: Secondary | ICD-10-CM | POA: Diagnosis not present

## 2021-08-14 DIAGNOSIS — M2578 Osteophyte, vertebrae: Secondary | ICD-10-CM | POA: Diagnosis not present

## 2021-08-17 NOTE — Progress Notes (Signed)
Office Visit Note   Patient: Claudia [name]           Date of Birth: 09/20/1933           MRN: [id number] Visit Date: 08/07/2021              Requested by: Janith Lima, MD [address] [place],  [place] [address] PCP: Janith Lima, MD   Assessment & Plan: Visit Diagnoses:  1. Spinal stenosis in cervical region     Plan: Patient is got some improvement with the prednisone initial first day of Dosepak.  She has an MRI scan coming up this week likely has progression of her cervical stenosis and she had been recommended to proceed with surgery back in 2017.  Follow-Up Instructions: No follow-ups on file.   Orders:  No orders of the defined types were placed in this encounter.  No orders of the defined types were placed in this encounter.     Procedures: No procedures performed   Clinical Data: No additional findings.   Subjective: Chief Complaint  Patient presents with   Difficulty Walking    HPI 88 year old female with cervical stenosis previously recommended to have surgery in  2017.  Patient's had progressive increased problems with walking, she has been using the wheelchair and and is here with her sisters.  She had a fall in December and states her problems walking has been worse since that time.  Increased pain over left trochanter.  Patient had previous fusion L4-5 and has some progressive spondylosis above and below her fusion without severe compression at either level.  Patient has cervical MRI scan scheduled 08/14/2021.  Patient states after she took first tablets of prednisone 6 tablets she states she is able to get out of bed and is moving better. stage III kidney disease lumbar cervical stenosis.  Previous lumbar fusion.  Atrial fib, history of gout, hypertension.  Patient is on Xarelto 20 mg.  Patient states she is having weakness in her legs some days she has not been able to walk other days she can walk just using the walker.  Outpatient therapy  was ordered but she has transportation problems.  She had been placed on hydrocodone 5/3 2590 tablets monthly by another provider.   Review of Systems negative for chills or fever no bowel bladder associated symptoms.  She has had problems with chronic constipation in the past.  All other systems noncontributory to HPI.   Objective: Vital Signs: Ht 5\' 10"  (1.778 m)    Wt 211 lb (95.7 kg)    BMI 30.28 kg/m   Physical Exam Constitutional:      Appearance: She is well-developed.  HENT:     Head: Normocephalic.     Right Ear: External ear normal.     Left Ear: External ear normal. There is no impacted cerumen.  Eyes:     Pupils: Pupils are equal, round, and reactive to light.  Neck:     Thyroid: No thyromegaly.     Trachea: No tracheal deviation.  Cardiovascular:     Rate and Rhythm: Normal rate.  Pulmonary:     Effort: Pulmonary effort is normal.  Abdominal:     Palpations: Abdomen is soft.  Musculoskeletal:     Cervical back: No rigidity.  Skin:    General: Skin is warm and dry.  Neurological:     Mental Status: She is alert and oriented to person, place, and time.  Psychiatric:  Behavior: Behavior normal.    Ortho Exam patient has negative straight leg raising right and left.  Bilateral brachial plexus tenderness.  Triceps little weaker on the left than right.  Interosseous weakness worse on left than right.  Biceps deltoid is strong bilaterally.  Negative shoulder impingement test.  No lower extremity clonus.  Specialty Comments:  No specialty comments available.  Imaging: No results found.   PMFS History: Patient Active Problem List   Diagnosis Date Noted   Encounter for general adult medical examination with abnormal findings 05/05/2021   PAF (paroxysmal atrial fibrillation) ([facility]) 11/03/2020   Stage 3b chronic kidney disease ([facility]) 05/06/2020   Chronic left-sided low back pain without sciatica 12/25/2018   Spinal stenosis in cervical region 05/12/2016    Therapeutic opioid-induced constipation (OIC) 01/01/2016   Chronic idiopathic constipation 09/11/2013   Lumbosacral spondylosis without myelopathy 04/09/2013   Postlaminectomy syndrome, lumbar region 04/09/2013   Anticoagulation management encounter 02/01/2012   Neuropathy, peripheral 08/04/2011   Pure hypercholesterolemia 08/04/2011   Essential hypertension, benign 08/04/2011   Hypothyroidism 08/04/2011   DJD (degenerative joint disease) of knee 08/04/2011   Gout 08/04/2011   Past Medical History:  Diagnosis Date   Abnormality of gait    Brachial neuritis or radiculitis NOS    Degeneration of lumbar or lumbosacral intervertebral disc    Essential hypertension, benign    Gouty arthropathy    Lumbago    Obesity    Osteoarthritis    Pure hypercholesterolemia    Unspecified hypothyroidism     Family History  Problem Relation Age of Onset   Heart disease Father    Heart attack Father    Hypertension Mother    Alzheimer's disease Mother    Diabetes Sister    Cirrhosis Brother    Diabetes Sister    Diabetes Sister    Diabetes Brother    Heart attack Brother    Cancer Neg Hx    Kidney disease Neg Hx     Past Surgical History:  Procedure Laterality Date   ABDOMINAL HYSTERECTOMY  03/13/2010   BACK SURGERY     LUMBAR LAMINECTOMY  03/13/2010   right knee replacement  03/13/2010   Social History   Occupational History   Not on file  Tobacco Use   Smoking status: Never   Smokeless tobacco: Never  Substance and Sexual Activity   Alcohol use: No    Comment: social   Drug use: No   Sexual activity: Never

## 2021-08-19 ENCOUNTER — Ambulatory Visit: Payer: PPO | Admitting: Orthopaedic Surgery

## 2021-08-20 ENCOUNTER — Other Ambulatory Visit: Payer: Self-pay | Admitting: Internal Medicine

## 2021-08-20 DIAGNOSIS — M961 Postlaminectomy syndrome, not elsewhere classified: Secondary | ICD-10-CM

## 2021-08-20 DIAGNOSIS — M47817 Spondylosis without myelopathy or radiculopathy, lumbosacral region: Secondary | ICD-10-CM

## 2021-08-20 DIAGNOSIS — M170 Bilateral primary osteoarthritis of knee: Secondary | ICD-10-CM

## 2021-08-21 ENCOUNTER — Ambulatory Visit: Payer: PPO | Admitting: Orthopaedic Surgery

## 2021-08-21 ENCOUNTER — Encounter: Payer: Self-pay | Admitting: Orthopaedic Surgery

## 2021-08-21 ENCOUNTER — Other Ambulatory Visit: Payer: Self-pay

## 2021-08-21 VITALS — BP 122/72 | HR 65 | Ht 70.0 in | Wt 211.0 lb

## 2021-08-21 DIAGNOSIS — D329 Benign neoplasm of meninges, unspecified: Secondary | ICD-10-CM | POA: Diagnosis not present

## 2021-08-21 DIAGNOSIS — M4802 Spinal stenosis, cervical region: Secondary | ICD-10-CM | POA: Diagnosis not present

## 2021-08-24 ENCOUNTER — Encounter: Payer: Self-pay | Admitting: Orthopaedic Surgery

## 2021-08-24 DIAGNOSIS — D329 Benign neoplasm of meninges, unspecified: Secondary | ICD-10-CM | POA: Insufficient documentation

## 2021-08-24 NOTE — Progress Notes (Signed)
Office Visit Note   Patient: Claudia [name]           Date of Birth: 10-Sep-1933           MRN: [id number] Visit Date: 08/21/2021              Requested by: Janith Lima, MD [address] [place],  [place] [address] PCP: Janith Lima, MD   Assessment & Plan: Visit Diagnoses:  1. Spinal stenosis in cervical region   2. Meningioma ([facility])     Plan: Patient has 3 level cord compression and would require C3-4, C4-5 C5-6 anterior cervical discectomy and fusion allograft with plate.  We reviewed images discussed operative technique.  She understands that some component of her cord is likely to be reversible.  We discussed surgery is to help prevent progression.  She need to make some arrangements for someone to be available after surgery since her husband has Alzheimer's and she has been taking care of him.  Procedure discussed risk discussed.  She understands request to proceed.  With her history of atrial fibrillation she will need cardiac and medical clearance.  Follow-Up Instructions: No follow-ups on file.   Orders:  No orders of the defined types were placed in this encounter.  No orders of the defined types were placed in this encounter.     Procedures: No procedures performed   Clinical Data: No additional findings.   Subjective: Chief Complaint  Patient presents with   Neck - Follow-up    MRI cervical spine review    HPI 88 year old female seen with cervical myelopathy three-level cord compression previously saw Dr.Diddy in 2017 and surgery was recommended at that point which she did not proceed with.  She has had difficulty walking she states her husband has to lift her left leg sometimes to get in and out of bed she had to use a walker or cane.  She got some relief with prednisone.  MRI scan on 08/14/2021 showed progressive stenosis with chronic compression myelopathy with abnormal T2 signal of the cord at C3-4, C4-5 and C5-6.  Patient also had evidence of  small meningioma at the foramen magnum on the right measuring 3 x 8 mm which radiologist felt was probably present in 2007 is either the same or possibly a millimeter larger projecting the slow growth rate.  Patient's had some numbness and tingling in her hands primarily myelopathic symptoms more difficulty walking.  Patient states she has husband with Alzheimer's and has to take care of him which is a problem for her.  She also has atrial fibrillation and is on chronic Xarelto.  She has been on Neurontin as well.  Patient is not taking any narcotic medication.  Patient had previous L4-5 fusion with some spondylosis at L3-4 and L5-S1 without high-grade stenosis or impingement.  Some left neuroforaminal stenosis noted at T10-11.  Scanned MRI 07/05/21.  Review of Systems history of stage III kidney disease.  History of gout hypothyroidism hypertension atrial fibs.  Cervical stenosis with myelopathic changes since 2017.   Objective: Vital Signs: BP 122/72    Pulse 65    Ht 5\' 10"  (1.778 m)    Wt 211 lb (95.7 kg)    BMI 30.28 kg/m   Physical Exam Constitutional:      Appearance: She is well-developed.  HENT:     Head: Normocephalic.     Right Ear: External ear normal.     Left Ear: External ear normal. There is  no impacted cerumen.  Eyes:     Pupils: Pupils are equal, round, and reactive to light.  Neck:     Thyroid: No thyromegaly.     Trachea: No tracheal deviation.  Cardiovascular:     Rate and Rhythm: Normal rate.  Pulmonary:     Effort: Pulmonary effort is normal.  Abdominal:     Palpations: Abdomen is soft.  Musculoskeletal:     Cervical back: No rigidity.  Skin:    General: Skin is warm and dry.  Neurological:     Mental Status: She is alert and oriented to person, place, and time.  Psychiatric:        Behavior: Behavior normal.    Ortho Exam patient with 3+ lower extremity reflexes no clonus.  Patient has some decreased balance short stride gait more left leg weakness than  right.  Upper extremity reflexes are 1+ and symmetrical.  Biceps triceps wrist flexion extension shows symmetrical strength.  Bilateral brachial plexus tenderness increased pain with compression.  Positive Lhermitte.   Specialty Comments:  No specialty comments available.  Imaging: Narrative & Impression  CLINICAL DATA:  C5-6, bilateral C7 weakness. Weakness and numbness of both hands and fingers.   EXAM: MRI CERVICAL SPINE WITHOUT CONTRAST   TECHNIQUE: Multiplanar, multisequence MR imaging of the cervical spine was performed. No intravenous contrast was administered.   COMPARISON:  CT and MRI studies 05/06/2016   FINDINGS: Alignment: 1 mm anterolisthesis C7-T1.  No other malalignment.   Vertebrae: No fracture or focal bone lesion. Chronic heterogeneous marrow pattern, quite likely benign.   Cord: Cord flattening at C3-4, C4-5 and C5-6 with low level T2 signal, likely indicating compressive myelopathy at all these levels. Findings have worsened since 2017. See below for details at each level.   Posterior Fossa, vertebral arteries, paraspinal tissues: Probable meningioma on the right side of the foramen magnum measuring 3 x 8 mm. This does not appear to encroach upon the neural structures. This may have been present in 2017, perhaps measuring a mm smaller. This would indicate a slow growth rate, if any.   Disc levels:   C1-2: Ordinary mild osteoarthritis.  No stenosis.   C2-3: Chronic fusion across the facets. Sufficient patency of the canal and foramina.   C3-4: Spondylosis with endplate osteophytes and bulging of the disc. Facet and ligamentous prominence. Spinal stenosis with AP diameter of canal only 5.4 mm. Deformity of the cord with low level T2 signal. Bilateral foraminal stenosis.   C4-5: Spondylosis with endplate osteophytes and bulging of the disc. Facet and ligamentous prominence. Spinal stenosis with AP diameter of the canal only 5.2 mm. Cord deformity and  abnormal T2 signal. Bilateral foraminal stenosis.   C5-6: Spondylosis with endplate osteophytes and bulging of the disc. Facet and ligamentous prominence. Findings more pronounced on the left. Spinal stenosis with AP diameter of the canal only 5.7 mm. Cord deformity and abnormal T2 signal. Bilateral foraminal stenosis, left much worse than right.   C6-7: Disc bulge. Mild facet hypertrophy. No compressive canal stenosis. Mild bilateral foraminal stenosis.   C7-T1: Facet osteoarthritis with 1 mm of anterolisthesis. No canal stenosis. Mild foraminal narrowing on the left.   IMPRESSION: Chronic and worsening spinal stenosis at C3-4, C4-5 and C5-6 with cord deformity and abnormal T2 signal within the cord consistent with chronic compressive myelopathy. Bilateral foraminal stenosis at those levels that could cause neural compression on either or both sides.   There has been auto fusion across the facet joints at the  C2-3 level.   C6-7 spondylosis with only mild bilateral foraminal narrowing.   C7-T1 facet arthropathy with 1 mm of anterolisthesis and mild foraminal narrowing on the left.   Probable small meningioma at the foramen magnum to the right of midline measuring 3 x 8 mm, without significant encroachment upon the neural structures. I think this was probably present in 2017 and is either same or only a mm larger, projecting a slow growth rate.     Electronically Signed   By: Nelson Chimes M.D.   On: 08/14/2021 20:49     PMFS History: Patient Active Problem List   Diagnosis Date Noted   Meningioma ([facility]) 08/24/2021   Encounter for general adult medical examination with abnormal findings 05/05/2021   PAF (paroxysmal atrial fibrillation) ([facility]) 11/03/2020   Stage 3b chronic kidney disease ([facility]) 05/06/2020   Chronic left-sided low back pain without sciatica 12/25/2018   Spinal stenosis in cervical region 05/12/2016   Therapeutic opioid-induced constipation (OIC) 01/01/2016    Chronic idiopathic constipation 09/11/2013   Lumbosacral spondylosis without myelopathy 04/09/2013   Postlaminectomy syndrome, lumbar region 04/09/2013   Anticoagulation management encounter 02/01/2012   Neuropathy, peripheral 08/04/2011   Pure hypercholesterolemia 08/04/2011   Essential hypertension, benign 08/04/2011   Hypothyroidism 08/04/2011   DJD (degenerative joint disease) of knee 08/04/2011   Gout 08/04/2011   Past Medical History:  Diagnosis Date   Abnormality of gait    Brachial neuritis or radiculitis NOS    Degeneration of lumbar or lumbosacral intervertebral disc    Essential hypertension, benign    Gouty arthropathy    Lumbago    Obesity    Osteoarthritis    Pure hypercholesterolemia    Unspecified hypothyroidism     Family History  Problem Relation Age of Onset   Heart disease Father    Heart attack Father    Hypertension Mother    Alzheimer's disease Mother    Diabetes Sister    Cirrhosis Brother    Diabetes Sister    Diabetes Sister    Diabetes Brother    Heart attack Brother    Cancer Neg Hx    Kidney disease Neg Hx     Past Surgical History:  Procedure Laterality Date   ABDOMINAL HYSTERECTOMY  03/13/2010   BACK SURGERY     LUMBAR LAMINECTOMY  03/13/2010   right knee replacement  03/13/2010   Social History   Occupational History   Not on file  Tobacco Use   Smoking status: Never   Smokeless tobacco: Never  Substance and Sexual Activity   Alcohol use: No    Comment: social   Drug use: No   Sexual activity: Never

## 2021-08-26 ENCOUNTER — Telehealth: Payer: Self-pay | Admitting: *Deleted

## 2021-08-26 NOTE — Telephone Encounter (Signed)
? ?  Pre-operative Risk Assessment  ?  ?Patient Name: Claudia [name]  ?DOB: 02-Oct-1933 ?MRN: [id number]  ? ?  ? ?Request for Surgical Clearance   ? ?Procedure:   CERVICAL FUSION ? ?Date of Surgery:  Clearance TBD                              ?   ?Surgeon:  DR. MARK YATES ?Surgeon's Group or Practice Name:  [facility] ?Phone number:  [phone] ?Fax number:  [phone] ?  ?Type of Clearance Requested:   ?- Medical  ?- Pharmacy:  Hold Rivaroxaban (Xarelto)   ?  ?Type of Anesthesia:  General  ?  ?Additional requests/questions:   ? ?Signed, ?Julaine Hua   ?08/26/2021, 12:05 PM  ? ?

## 2021-08-27 NOTE — Telephone Encounter (Signed)
Patient with diagnosis of atrial fibrillation on Xarelto for anticoagulation.   ? ?Procedure: cervical fusion ?Date of procedure: TBD ? ? ?CHA2DS2-VASc Score = 4  ? This indicates a 4.8% annual risk of stroke. ?The patient's score is based upon: ?CHF History: 0 ?HTN History: 1 ?Diabetes History: 0 ?Stroke History: 0 ?Vascular Disease History: 0 ?Age Score: 2 ?Gender Score: 1 ?  ?CrCl 61 ?Platelet count 243 ? ?Per office protocol, patient can hold Xarelto for 3 days prior to procedure.   ?Patient will not need bridging with Lovenox (enoxaparin) around procedure. ? ? ?

## 2021-08-28 NOTE — Telephone Encounter (Signed)
Routed back to the requesting surgeon's office to make them aware of pt's appointment, that clearance will be addressed at that time.  ?

## 2021-08-28 NOTE — Telephone Encounter (Signed)
Preoperative team, Claudia [name] has an appointment with Dr. Oval Linsey on 09/15/2021.  Her procedure has not yet been scheduled.  I will defer her preoperative cardiac evaluation and recommendations to Dr. Oval Linsey at the time of her follow-up visit.  Please add preoperative cardiac evaluation to the appointment notes.  Pharmacy has provided recommendations.  We will remove patient from preoperative cardiac pool.  Thank you for your help. ? ?Jossie Ng. Shilah Hefel NP-C ? ?  ?08/28/2021, 7:38 AM ?[facility] ?[address] ?Office [phone] Fax [phone] ? ?

## 2021-09-03 ENCOUNTER — Ambulatory Visit: Payer: PPO | Admitting: Internal Medicine

## 2021-09-07 ENCOUNTER — Other Ambulatory Visit: Payer: Self-pay

## 2021-09-07 ENCOUNTER — Ambulatory Visit (HOSPITAL_BASED_OUTPATIENT_CLINIC_OR_DEPARTMENT_OTHER): Payer: PPO | Admitting: Cardiovascular Disease

## 2021-09-07 ENCOUNTER — Encounter (HOSPITAL_BASED_OUTPATIENT_CLINIC_OR_DEPARTMENT_OTHER): Payer: Self-pay | Admitting: Cardiovascular Disease

## 2021-09-07 VITALS — BP 124/66 | HR 66 | Ht 70.0 in | Wt 179.0 lb

## 2021-09-07 DIAGNOSIS — E7800 Pure hypercholesterolemia, unspecified: Secondary | ICD-10-CM | POA: Diagnosis not present

## 2021-09-07 DIAGNOSIS — I10 Essential (primary) hypertension: Secondary | ICD-10-CM | POA: Diagnosis not present

## 2021-09-07 DIAGNOSIS — I480 Paroxysmal atrial fibrillation: Secondary | ICD-10-CM | POA: Diagnosis not present

## 2021-09-07 DIAGNOSIS — R600 Localized edema: Secondary | ICD-10-CM

## 2021-09-07 DIAGNOSIS — Z01818 Encounter for other preprocedural examination: Secondary | ICD-10-CM

## 2021-09-07 NOTE — Assessment & Plan Note (Signed)
Blood pressure is very well controlled.  She has not required any antihypertensives. ?

## 2021-09-07 NOTE — Assessment & Plan Note (Signed)
Will get an echo.  Continue torsemide. ?

## 2021-09-07 NOTE — Assessment & Plan Note (Signed)
Unfortunately she is unable to walk anymore.  She is still limited by her weakness that I cannot fully assess her functional capacity or whether or not she has chest pain/shortness of breath.  Recommend getting a Lexiscan Myoview to rule out ischemia.  Also recommend getting an echocardiogram given her new lower extremity edema.  I suspect this is more related to venous insufficiency given that she has no shortness of breath or orthopnea. ? ?Shared Decision Making/Informed Consent ?The risks [chest pain, shortness of breath, cardiac arrhythmias, dizziness, blood pressure fluctuations, myocardial infarction, stroke/transient ischemic attack, nausea, vomiting, allergic reaction, radiation exposure, metallic taste sensation and life-threatening complications (estimated to be 1 in 10,000)], benefits (risk stratification, diagnosing coronary artery disease, treatment guidance) and alternatives of a nuclear stress test were discussed in detail with Claudia [name] and she agrees to proceed. ? ? ? ? ?

## 2021-09-07 NOTE — Progress Notes (Signed)
Cardiology Office Note  Date:  09/07/2021   ID:  Claudia, [name] 12/10/1933, MRN [id number]  PCP:  Janith Lima, MD  Cardiologist:   Skeet Latch, MD   No chief complaint on file.    History of Present Illness: Claudia [name] is a 88 y.o. female with atrial fibrillation, hyperlipidemia, hyperlipidemia, and hypothyroidism who is being seen today for follow-up. Claudia [name] saw Dr. Harrington Challenger 03/2016 for bradycardia.  At that time Claudia [name] was referred for an echo that revealed LVEF 55-60% with moderate LVH. Claudia [name] was asymptotic at the time. Claudia [name] saw Almyra Deforest, PA on 04/16/20 and reported palpitations. Claudia [name] wore an ambulatory monitor 06/16/20, revealed episodes of atrial fibrillation. Claudia [name] was seen in EP clinic and started on Eliquis.   At Claudia [name] last appointment Claudia [name] was doing well and had few episodes of Afib. Claudia [name] was struggling to care for Claudia [name] husband who has dementia. Claudia [name] had been started on Eliquis but was unable to afford it, so Claudia [name] was switched back to Warfarin. Claudia [name] had subsequently been started on Xarelto. Claudia [name] has spinal stenosis and is here today for pre-operative clearance. Claudia [name] is accompanied by a family member. Today, Claudia [name] is feeling terrible overall. Claudia [name] is suffering from left sided numbness and chronic back pain. Due to Claudia [name] symptoms, Claudia [name] has difficulty completing Claudia [name] ADL's and is unable to formally exercise. Claudia [name] walks with a walker at all times. Also Claudia [name] is having difficulty with ambulation of Claudia [name] extremities. It is difficult for Claudia [name] to walk, and Claudia [name] is unable to write or drive. Generally Claudia [name] has noticed improvement in Claudia [name] swelling. Claudia [name] was started on 20 mg torsemide about 6 weeks ago. Claudia [name] breathing has been good, but Claudia [name] continues to have some bilateral LE edema. This is worse when on Claudia [name] feet. Claudia [name] weight is down, which Claudia [name] attributes to a loss of appetite. Claudia [name] cooks Claudia [name] meals occasionally, and often has meals brought to them. Claudia [name] denies any palpitations, chest pain, shortness of breath, or  peripheral edema. No lightheadedness, headaches, syncope, orthopnea, or PND.   Past Medical History:  Diagnosis Date   Abnormality of gait    Brachial neuritis or radiculitis NOS    Degeneration of lumbar or lumbosacral intervertebral disc    Essential hypertension, benign    Gouty arthropathy    Lumbago    Obesity    Osteoarthritis    Pure hypercholesterolemia    Unspecified hypothyroidism     Past Surgical History:  Procedure Laterality Date   ABDOMINAL HYSTERECTOMY  03/13/2010   BACK SURGERY     LUMBAR LAMINECTOMY  03/13/2010   right knee replacement  03/13/2010     Current Outpatient Medications  Medication Sig Dispense Refill   diltiazem (CARDIZEM) 30 MG tablet TAKE 1 TABLET BY MOUTH EVERY 4 HOURS AS NEEDED FOR AFIB HEART RATE GREATER THAN 100 AS LONG AS BLOOD PRESSURE LESS THAN 100 (Patient taking differently: Take 30 mg by mouth 4 (four) times daily as needed (AFIB). Take when heart rate > 100, as long as BP is <100) 90 tablet 3   HYDROcodone-acetaminophen (NORCO/VICODIN) 5-325 MG tablet TAKE ONE TABLET BY MOUTH EVERY 8 HOURS BEFORE MEALS 90 tablet 0   rivaroxaban (XARELTO) 20 MG TABS tablet Take 1 tablet (20 mg total) by mouth daily with supper. 30 tablet 3   torsemide (DEMADEX) 20 MG tablet Take 1 tablet (20 mg total) by mouth daily. 90 tablet 1   vitamin C (ASCORBIC ACID) 500 MG tablet Take 500 mg by  mouth daily.     vitamin E 200 UNIT capsule Take 200 Units by mouth daily.     No current facility-administered medications for this visit.    Allergies:   Lipitor [atorvastatin] and Trileptal [oxcarbazepine]    Social History:  The patient  reports that Claudia [name] has never smoked. Claudia [name] has never used smokeless tobacco. Claudia [name] reports that Claudia [name] does not drink alcohol and does not use drugs.   Family History:  The patient's family history includes Alzheimer's disease in Claudia [name] mother; Cirrhosis in Claudia [name] brother; Diabetes in Claudia [name] brother, sister, sister, and sister; Heart attack in Claudia [name]  brother and father; Heart disease in Claudia [name] father; Hypertension in Claudia [name] mother.    ROS:   Please see the history of present illness. (+) Left-sided numbness (+) Chronic back pain (+) Bilateral LE edema (+) Loss of appetite (+) Difficulty with ambulation All other systems are reviewed and negative.     PHYSICAL EXAM: VS:  BP 124/66 (BP Location: Left Arm, Patient Position: Sitting, Cuff Size: Normal)    Pulse 66    Ht '5\' 10"'$  (1.778 m)    Wt 179 lb (81.2 kg)    BMI 25.68 kg/m  , BMI Body mass index is 25.68 kg/m. GENERAL:  Well appearing; In wheelchair. HEENT:  Pupils equal round and reactive, fundi not visualized, oral mucosa unremarkable NECK:  No jugular venous distention, waveform within normal limits, carotid upstroke brisk and symmetric, no bruits LUNGS:  Clear to auscultation bilaterally HEART:  RRR.  PMI not displaced or sustained,S1 and S2 within normal limits, no S3, no S4, no clicks, no rubs, no murmurs ABD:  Flat, positive bowel sounds normal in frequency in pitch, no bruits, no rebound, no guarding, no midline pulsatile mass, no hepatomegaly, no splenomegaly EXT:  2 plus pulses throughout, 2+ bilateral LE edema to the lower tibia bilaterally, no cyanosis no clubbing SKIN:  No rashes no nodules NEURO:  Cranial nerves II through XII grossly intact, Unable to lift legs unassisted.  Bilateral UE weakness. PSYCH:  Cognitively intact, oriented to person place and time   EKG:  09/07/2021: Sinus rhythm. Rate 66 bpm. Nonspecific T wave abnormalities. 9/22: sinus bradycardia.  Rate 57 bpm. 2/19:sinus bradycardia.  Rate 49 bpm.    LE Venous Doppler 07/01/2021: Summary:  RIGHT:  - There is no evidence of deep vein thrombosis in the lower extremity.     - No cystic structure found in the popliteal fossa.     LEFT:  - There is no evidence of deep vein thrombosis in the lower extremity.     - No cystic structure found in the popliteal fossa.  Monitor 05/2020: 30 Day Event  Monitor   Quality: Fair.  Baseline artifact. Predominant rhythm: sinus rhythm Average heart rate: 62 bpm Max heart rate: 197 bpm Min heart rate: 35 bpm Pauses >2.5 seconds: none   Episodes of atrial fibrillation up to 21 minutes Atrial runs Up to 13 beats NSVT PVCs and ventricular bigeminy  Echo 03/30/16: Study Conclusions   - Left ventricle: The cavity size was normal. Wall thickness was   increased in a pattern of moderate LVH. There was focal basal   hypertrophy. Systolic function was normal. The estimated ejection   fraction was in the range of 55% to 60%. Wall motion was normal;   there were no regional wall motion abnormalities. Left   ventricular diastolic function parameters were normal for the   patient&'s age. - Aortic valve: There was trivial regurgitation. - Left  atrium: The atrium was moderately dilated. - Pulmonary arteries: Systolic pressure was mildly increased. PA   peak pressure: 31 mm Hg (S).  Recent Labs: 05/05/2021: TSH 4.92 06/21/2021: ALT 23 07/01/2021: B Natriuretic Peptide 89.3; BUN 17; Creatinine, Ser 0.84; Hemoglobin 12.5; Platelets 243; Potassium 3.4; Sodium 139    Lipid Panel    Component Value Date/Time   CHOL 148 05/05/2021 1144   TRIG 85.0 05/05/2021 1144   HDL 54.40 05/05/2021 1144   CHOLHDL 3 05/05/2021 1144   VLDL 17.0 05/05/2021 1144   LDLCALC 77 05/05/2021 1144   LDLDIRECT 158.9 09/05/2012 1038    Wt Readings from Last 3 Encounters:  09/07/21 179 lb (81.2 kg)  08/21/21 211 lb (95.7 kg)  08/07/21 211 lb (95.7 kg)      ASSESSMENT AND PLAN:  Essential hypertension, benign Blood pressure is very well controlled.  Claudia [name] has not required any antihypertensives.  PAF (paroxysmal atrial fibrillation) (HCC) No recent episodes of atrial fibrillation.  Claudia [name] has not required any diltiazem.  Continue Xarelto.  Preoperative evaluation of a medical condition to rule out surgical contraindications (TAR required) Unfortunately Claudia [name] is unable  to walk anymore.  Claudia [name] is still limited by Claudia [name] weakness that I cannot fully assess Claudia [name] functional capacity or whether or not Claudia [name] has chest pain/shortness of breath.  Recommend getting a Lexiscan Myoview to rule out ischemia.  Also recommend getting an echocardiogram given Claudia [name] new lower extremity edema.  I suspect this is more related to venous insufficiency given that Claudia [name] has no shortness of breath or orthopnea.  Shared Decision Making/Informed Consent The risks [chest pain, shortness of breath, cardiac arrhythmias, dizziness, blood pressure fluctuations, myocardial infarction, stroke/transient ischemic attack, nausea, vomiting, allergic reaction, radiation exposure, metallic taste sensation and life-threatening complications (estimated to be 1 in 10,000)], benefits (risk stratification, diagnosing coronary artery disease, treatment guidance) and alternatives of a nuclear stress test were discussed in detail with Claudia [name] and Claudia [name] agrees to proceed.      Lower extremity edema Will get an echo.  Continue torsemide.  Pure hypercholesterolemia Lipids are well-controlled.     Current medicines are reviewed at length with the patient today.  The patient does not have concerns regarding medicines.  The following changes have been made:  switch Eliquis to warfarin  Labs/ tests ordered today include:   Orders Placed This Encounter  Procedures   MYOCARDIAL PERFUSION IMAGING   EKG 12-Lead   ECHOCARDIOGRAM COMPLETE    Disposition:    FU with Magdaline Zollars C. Oval Linsey, MD, [facility] in 6 months.  I,Mathew Stumpf,acting as a Education administrator for Skeet Latch, MD.,have documented all relevant documentation on the behalf of Skeet Latch, MD,as directed by  Skeet Latch, MD while in the presence of Skeet Latch, MD.  I, [place] Oval Linsey, MD have reviewed all documentation for this visit.  The documentation of the exam, diagnosis, procedures, and orders on 09/07/2021 are all accurate and  complete.  Signed, Jermichael Belmares C. Oval Linsey, MD, [facility]  09/07/2021 5:33 PM    [place]

## 2021-09-07 NOTE — Assessment & Plan Note (Signed)
Lipids are well controlled. ?

## 2021-09-07 NOTE — Patient Instructions (Signed)
Medication Instructions:  ?Your physician recommends that you continue on your current medications as directed. Please refer to the Current Medication list given to you today.  ? ?*If you need a refill on your cardiac medications before your next appointment, please call your pharmacy* ? ?Lab Work: ?NONE ? ?Testing/Procedures: ?Your physician has requested that you have an echocardiogram. Echocardiography is a painless test that uses sound waves to create images of your heart. It provides your doctor with information about the size and shape of your heart and how well your heart?s chambers and valves are working. This procedure takes approximately one hour. There are no restrictions for this procedure. ? ?Your physician has requested that you have a lexiscan myoview. For further information please visit [email or web address]. Please follow instruction sheet, as given. ? ?BOTH OF THE ABOVE TO BE DONE SAME DAY AT [place]  ? ?Follow-Up: ?At [facility], you and your health needs are our priority.  As part of our continuing mission to provide you with exceptional heart care, we have created designated Provider Care Teams.  These Care Teams include your primary Cardiologist (physician) and Advanced Practice Providers (APPs -  Physician Assistants and Nurse Practitioners) who all work together to provide you with the care you need, when you need it. ? ?We recommend signing up for the patient portal called "MyChart".  Sign up information is provided on this After Visit Summary.  MyChart is used to connect with patients for Virtual Visits (Telemedicine).  Patients are able to view lab/test results, encounter notes, upcoming appointments, etc.  Non-urgent messages can be sent to your provider as well.   ?To learn more about what you can do with MyChart, go to [email or web address].   ? ?Your next appointment:   ?6 month(s) ? ?The format for your next appointment:   ?In Person ? ?Provider:   ?Skeet Latch, MD{ ? ? ?

## 2021-09-07 NOTE — Assessment & Plan Note (Signed)
No recent episodes of atrial fibrillation.  She has not required any diltiazem.  Continue Xarelto. ?

## 2021-09-09 ENCOUNTER — Other Ambulatory Visit: Payer: Self-pay | Admitting: Cardiovascular Disease

## 2021-09-09 NOTE — Telephone Encounter (Signed)
Rx(s) sent to pharmacy electronically.  

## 2021-09-15 ENCOUNTER — Telehealth (HOSPITAL_COMMUNITY): Payer: Self-pay

## 2021-09-15 ENCOUNTER — Ambulatory Visit (HOSPITAL_BASED_OUTPATIENT_CLINIC_OR_DEPARTMENT_OTHER): Payer: PPO | Admitting: Cardiovascular Disease

## 2021-09-15 NOTE — Telephone Encounter (Signed)
Spoke with the patient, detailed instructions left with the patient. She states that she would be here for her test. Asked to call back with any questions. S.Redith Drach EMTP ?

## 2021-09-16 ENCOUNTER — Telehealth: Payer: PPO

## 2021-09-17 ENCOUNTER — Other Ambulatory Visit: Payer: Self-pay

## 2021-09-17 ENCOUNTER — Ambulatory Visit (HOSPITAL_BASED_OUTPATIENT_CLINIC_OR_DEPARTMENT_OTHER): Payer: PPO

## 2021-09-17 ENCOUNTER — Other Ambulatory Visit: Payer: Self-pay | Admitting: Internal Medicine

## 2021-09-17 ENCOUNTER — Ambulatory Visit (HOSPITAL_COMMUNITY): Payer: PPO | Attending: Cardiology

## 2021-09-17 DIAGNOSIS — M961 Postlaminectomy syndrome, not elsewhere classified: Secondary | ICD-10-CM

## 2021-09-17 DIAGNOSIS — M170 Bilateral primary osteoarthritis of knee: Secondary | ICD-10-CM

## 2021-09-17 DIAGNOSIS — Z01810 Encounter for preprocedural cardiovascular examination: Secondary | ICD-10-CM | POA: Diagnosis not present

## 2021-09-17 DIAGNOSIS — Z01818 Encounter for other preprocedural examination: Secondary | ICD-10-CM | POA: Insufficient documentation

## 2021-09-17 DIAGNOSIS — I480 Paroxysmal atrial fibrillation: Secondary | ICD-10-CM | POA: Insufficient documentation

## 2021-09-17 DIAGNOSIS — I10 Essential (primary) hypertension: Secondary | ICD-10-CM | POA: Diagnosis not present

## 2021-09-17 DIAGNOSIS — R600 Localized edema: Secondary | ICD-10-CM | POA: Diagnosis not present

## 2021-09-17 DIAGNOSIS — M47817 Spondylosis without myelopathy or radiculopathy, lumbosacral region: Secondary | ICD-10-CM

## 2021-09-17 LAB — MYOCARDIAL PERFUSION IMAGING
LV dias vol: 63 mL (ref 46–106)
LV sys vol: 21 mL
Nuc Stress EF: 67 %
Peak HR: 69 {beats}/min
Rest HR: 54 {beats}/min
Rest Nuclear Isotope Dose: 10.3 mCi
SDS: 0
SRS: 0
SSS: 0
ST Depression (mm): 0 mm
Stress Nuclear Isotope Dose: 32.9 mCi
TID: 0.79

## 2021-09-17 LAB — ECHOCARDIOGRAM COMPLETE
Area-P 1/2: 1.83 cm2
Height: 70 in
P 1/2 time: 485 msec
S' Lateral: 2.45 cm
Weight: 2864 oz

## 2021-09-17 MED ORDER — REGADENOSON 0.4 MG/5ML IV SOLN
0.4000 mg | Freq: Once | INTRAVENOUS | Status: AC
  Administered 2021-09-17: 0.4 mg via INTRAVENOUS

## 2021-09-17 MED ORDER — TECHNETIUM TC 99M TETROFOSMIN IV KIT
10.3000 | PACK | Freq: Once | INTRAVENOUS | Status: AC | PRN
  Administered 2021-09-17: 10.3 via INTRAVENOUS
  Filled 2021-09-17: qty 11

## 2021-09-17 MED ORDER — TECHNETIUM TC 99M TETROFOSMIN IV KIT
32.9000 | PACK | Freq: Once | INTRAVENOUS | Status: AC | PRN
  Administered 2021-09-17: 32.9 via INTRAVENOUS
  Filled 2021-09-17: qty 33

## 2021-09-23 ENCOUNTER — Telehealth: Payer: Self-pay

## 2021-09-23 NOTE — Telephone Encounter (Signed)
Clinical pharmacist to review Xarelto. ? ?Note, patient was recently seen by Dr. Oval Linsey and underwent echocardiogram and Myoview on 09/17/2021 which were normal. ? ?Patient is cleared to proceed with surgery.  Awaiting clinical pharmacist recommendation regarding Xarelto. ?

## 2021-09-23 NOTE — Telephone Encounter (Signed)
? ?  Pre-operative Risk Assessment  ?  ?Patient Name: Claudia [name]  ?DOB: [birth year] ?MRN: [id number]  ? ?  ? ?Request for Surgical Clearance   ? ?Procedure:   Cervical Fusions ? ?Date of Surgery:  Clearance TBD                              ?   ?Surgeon:  Rodell Perna, MD ?Surgeon's Group or Practice Name:  [facility] ?Phone number:  [phone] ?Fax number:  [phone] Attn: Dondra Spry ?  ?Type of Clearance Requested:   ?- Medical  ?- Pharmacy:  Hold Rivaroxaban (Xarelto)   ?  ?Type of Anesthesia:  General  ?  ?Additional requests/questions:     ? ?Signed, ?Rever Pichette   ?09/23/2021, 4:44 PM  ? ?

## 2021-09-24 NOTE — Telephone Encounter (Signed)
Already addressed 07/05/32 duplicate clearance note. ? ?

## 2021-10-06 ENCOUNTER — Other Ambulatory Visit: Payer: Self-pay

## 2021-10-07 ENCOUNTER — Telehealth: Payer: Self-pay

## 2021-10-07 ENCOUNTER — Other Ambulatory Visit: Payer: Self-pay | Admitting: Internal Medicine

## 2021-10-07 DIAGNOSIS — K5904 Chronic idiopathic constipation: Secondary | ICD-10-CM

## 2021-10-07 MED ORDER — LINACLOTIDE 290 MCG PO CAPS: 290.0000 ug | ORAL_CAPSULE | Freq: Every day | ORAL | 1 refills | Status: DC

## 2021-10-07 NOTE — Telephone Encounter (Signed)
Pt is requesting a refill on D/C medication:  ?linaclotide (LINZESS) 290 MCG CAPS capsule  ?Pt states she is about to have surgery and will need something to help her bowels to keep moving ? ?Pharmacy: ?[facility] - [place], [name] - [address] ? ? ?LOV 07/09/21 ?ROV 11/02/21 ?

## 2021-10-08 ENCOUNTER — Telehealth: Payer: Self-pay

## 2021-10-08 NOTE — Telephone Encounter (Signed)
I called from [facility] office, someone picked up and hung up. I called again, no answer. Will try from [facility] office tomorrow. ?

## 2021-10-08 NOTE — Telephone Encounter (Signed)
Patient would like a call back.  Stated that she has some questions concerning surgery.  CB# [phone].  Please advise.  Thank you. ?

## 2021-10-09 ENCOUNTER — Telehealth: Payer: Self-pay | Admitting: Orthopaedic Surgery

## 2021-10-09 NOTE — Telephone Encounter (Signed)
PT, is calling to see if she can get a wheelchair sent to her house. ? ?Please call the patient  ?

## 2021-10-09 NOTE — Telephone Encounter (Signed)
Would you be able to help me with this? Patient has cervical myelopathy and is getting ready to have cervical fusion. Last height and weight 5'10" 179lbs.  Address is correct in system. ? ?Could you send by [place]? ?

## 2021-10-09 NOTE — Telephone Encounter (Signed)
Will hold until Monday to monitor approval process. Entered order today.  ?

## 2021-10-09 NOTE — Telephone Encounter (Signed)
Please advise 

## 2021-10-09 NOTE — Telephone Encounter (Signed)
I spoke with patient. Dr. Lorin Mercy called and answered multiple questions. ?

## 2021-10-12 NOTE — Telephone Encounter (Signed)
I called patient and advised. 

## 2021-10-12 NOTE — Progress Notes (Signed)
Surgical Instructions ? ? ? Your procedure is scheduled on 10/19/21. ? Report to [facility] Main Entrance "A" at 5:30 A.M., then check in with the Admitting office. ? Call this number if you have problems the morning of surgery: ? [phone] ? ? If you have any questions prior to your surgery date call [phone]: Open Monday-Friday 8am-4pm ? ? ? Remember: ? Do not eat after midnight the night before your surgery ? ?You may drink clear liquids until 4:30 the morning of your surgery.   ?Clear liquids allowed are: Water, Non-Citrus Juices (without pulp), Carbonated Beverages, Clear Tea, Black Coffee ONLY (NO MILK, CREAM OR POWDERED CREAMER of any kind), and Gatorade ? ?Please complete your PRE-SURGERY ENSURE that was provided to you by 4:30 the morning of surgery.  Please, if able, drink it in one setting. DO NOT SIP. ? ?  ? Take these medicines the morning of surgery with A SIP OF WATER:   ?HYDROcodone-acetaminophen (NORCO/VICODIN)  ? ?AS NEEDED: ?diltiazem (CARDIZEM)  ? ? ?Follow your surgeon's instructions on when to stop Xarelto.  If no instructions were given by your surgeon then you will need to call the office to get those instructions.    ? ? ?As of today, STOP taking any Aspirin (unless otherwise instructed by your surgeon) Aleve, Naproxen, Ibuprofen, Motrin, Advil, Goody's, BC's, all herbal medications, fish oil, and all vitamins. ? ?         ?Do not wear jewelry or makeup ?Do not wear lotions, powders, perfumes  or deodorant. ?Do not shave 48 hours prior to surgery.   ?Do not bring valuables to the hospital. ?Do not wear nail polish, gel polish, artificial nails, or any other type of covering on natural nails (fingers and toes) ?If you have artificial nails or gel coating that need to be removed by a nail salon, please have this removed prior to surgery. Artificial nails or gel coating may interfere with anesthesia's ability to adequately monitor your vital signs. ? ?[facility] is not responsible for  any belongings or valuables. .  ? ?Do NOT Smoke (Tobacco/Vaping)  24 hours prior to your procedure ? ?If you use a CPAP at night, you may bring your mask for your overnight stay. ?  ?Contacts, glasses, hearing aids, dentures or partials may not be worn into surgery, please bring cases for these belongings ?  ?For patients admitted to the hospital, discharge time will be determined by your treatment team. ?  ?Patients discharged the day of surgery will not be allowed to drive home, and someone needs to stay with them for 24 hours. ? ? ?SURGICAL WAITING ROOM VISITATION ?Patients having surgery or a procedure in a hospital may have two support people. ?Children under the age of 109 must have an adult with them who is not the patient. ?They may stay in the waiting area during the procedure and may switch out with other visitors. If the patient needs to stay at the hospital during part of their recovery, the visitor guidelines for inpatient rooms apply. ? ?Please refer to the [facility] website for the visitor guidelines for Inpatients (after your surgery is over and you are in a regular room).  ? ? ? ?Special instructions:   ? ?Oral Hygiene is also important to reduce your risk of infection.  Remember - BRUSH YOUR TEETH THE MORNING OF SURGERY WITH YOUR REGULAR TOOTHPASTE ? ? ?[facility]- Preparing For Surgery ? ?Before surgery, you can play an important role. Because skin is  not sterile, your skin needs to be as free of germs as possible. You can reduce the number of germs on your skin by washing with CHG (chlorahexidine gluconate) Soap before surgery.  CHG is an antiseptic cleaner which kills germs and bonds with the skin to continue killing germs even after washing.   ? ? ?Please do not use if you have an allergy to CHG or antibacterial soaps. If your skin becomes reddened/irritated stop using the CHG.  ?Do not shave (including legs and underarms) for at least 48 hours prior to first CHG shower. It is OK to shave your  face. ? ?Please follow these instructions carefully. ?  ? ? Shower the NIGHT BEFORE SURGERY and the MORNING OF SURGERY with CHG Soap.  ? If you chose to wash your hair, wash your hair first as usual with your normal shampoo. After you shampoo, rinse your hair and body thoroughly to remove the shampoo.  Then [employer] and genitals (private parts) with your normal soap and rinse thoroughly to remove soap. ? ?After that Use CHG Soap as you would any other liquid soap. You can apply CHG directly to the skin and wash gently with a scrungie or a clean washcloth.  ? ?Apply the CHG Soap to your body ONLY FROM THE NECK DOWN.  Do not use on open wounds or open sores. Avoid contact with your eyes, ears, mouth and genitals (private parts). Wash Face and genitals (private parts)  with your normal soap.  ? ?Wash thoroughly, paying special attention to the area where your surgery will be performed. ? ?Thoroughly rinse your body with warm water from the neck down. ? ?DO NOT shower/wash with your normal soap after using and rinsing off the CHG Soap. ? ?Pat yourself dry with a CLEAN TOWEL. ? ?Wear CLEAN PAJAMAS to bed the night before surgery ? ?Place CLEAN SHEETS on your bed the night before your surgery ? ?DO NOT SLEEP WITH PETS. ? ? ?Day of Surgery: ? ?Take a shower with CHG soap. ?Wear Clean/Comfortable clothing the morning of surgery ?Do not apply any deodorants/lotions.   ?Remember to brush your teeth WITH YOUR REGULAR TOOTHPASTE. ? ? ? ?If you received a COVID test during your pre-op visit  it is requested that you wear a mask when out in public, stay away from anyone that may not be feeling well and notify your surgeon if you develop symptoms. If you have been in contact with anyone that has tested positive in the last 10 days please notify you surgeon. ? ?  ?Please read over the following fact sheets that you were given.   ?

## 2021-10-12 NOTE — Telephone Encounter (Signed)
Email received on 10/10/21 from [employer] stating that the order is in final review. They will call the pt to discuss any potential out of pocket expense and delivery. Please call pt to advise they will be calling. Thanks! ?

## 2021-10-13 ENCOUNTER — Encounter (HOSPITAL_COMMUNITY): Payer: Self-pay

## 2021-10-13 ENCOUNTER — Other Ambulatory Visit: Payer: Self-pay

## 2021-10-13 ENCOUNTER — Encounter (HOSPITAL_COMMUNITY)
Admission: RE | Admit: 2021-10-13 | Discharge: 2021-10-13 | Disposition: A | Discharge status: Home / Self Care | Payer: PPO | Source: Ambulatory Visit | Attending: Orthopaedic Surgery | Admitting: Orthopaedic Surgery

## 2021-10-13 DIAGNOSIS — Z01812 Encounter for preprocedural laboratory examination: Secondary | ICD-10-CM | POA: Insufficient documentation

## 2021-10-13 DIAGNOSIS — R609 Edema, unspecified: Secondary | ICD-10-CM | POA: Diagnosis not present

## 2021-10-13 DIAGNOSIS — E785 Hyperlipidemia, unspecified: Secondary | ICD-10-CM | POA: Diagnosis not present

## 2021-10-13 DIAGNOSIS — Z01818 Encounter for other preprocedural examination: Secondary | ICD-10-CM

## 2021-10-13 DIAGNOSIS — I4891 Unspecified atrial fibrillation: Secondary | ICD-10-CM | POA: Insufficient documentation

## 2021-10-13 DIAGNOSIS — R001 Bradycardia, unspecified: Secondary | ICD-10-CM | POA: Insufficient documentation

## 2021-10-13 DIAGNOSIS — Z7901 Long term (current) use of anticoagulants: Secondary | ICD-10-CM | POA: Insufficient documentation

## 2021-10-13 HISTORY — DX: Cardiac arrhythmia, unspecified: I49.9

## 2021-10-13 LAB — TYPE AND SCREEN
ABO/RH(D): O POS
Antibody Screen: NEGATIVE

## 2021-10-13 LAB — SURGICAL PCR SCREEN

## 2021-10-13 NOTE — Progress Notes (Signed)
PCP:  [name], MD ?Cardiologist:  Skeet Latch, MD ? ?EKG:  09/07/21 ?CXR:  07/01/21 ?ECHO:  09/17/21 ?Stress Test:  09/17/21 ?Cardiac Cath:  denies ? ?Fasting Blood Sugar-  na ?Checks Blood Sugar__na_ times a day ? ?ASA: No ?Blood Thinner: Last dose Xarelto 10/12/21 per patient ? ?Covid test not needed ? ?Anesthesia Review: Yes, cardiac history.  Medical clearance on chart.  ? ?Patient denies shortness of breath, fever, cough, and chest pain at PAT appointment. ? ?Patient verbalized understanding of instructions provided today at the PAT appointment.  Patient asked to review instructions at home and day of surgery.   ?

## 2021-10-14 NOTE — Progress Notes (Signed)
Patient's surgical PCR test was invalid at PAT appointment.  Specimen will need to be recollected DOS.   ?

## 2021-10-14 NOTE — Anesthesia Preprocedure Evaluation (Addendum)
Anesthesia Evaluation  ?Patient identified by MRN, date of birth, ID band ?Patient awake ? ? ? ?Airway ?Mallampati: II ? ?TM Distance: >3 FB ?Neck ROM: Full ? ? ? Dental ? ?(+) Dental Advisory Given ?  ?Pulmonary ?neg pulmonary ROS,  ?  ?breath sounds clear to auscultation ? ? ? ? ? ? Cardiovascular ?hypertension, Pt. on medications ?+ dysrhythmias Atrial Fibrillation  ?Rhythm:Regular Rate:Normal ? ? ?  ?Neuro/Psych ? Neuromuscular disease   ? GI/Hepatic ?negative GI ROS, Neg liver ROS,   ?Endo/Other  ?negative endocrine ROS ? Renal/GU ?Renal disease  ? ?  ?Musculoskeletal ? ?(+) Arthritis ,  ? Abdominal ?  ?Peds ? Hematology ?negative hematology ROS ?(+)   ?Anesthesia Other Findings ? ? Reproductive/Obstetrics ? ?  ? ? ? ? ? ? ? ? ? ? ? ? ? ?  ?  ? ? ? ? ? ? ? ?Lab Results  ?Component Value Date  ? WBC 4.9 10/19/2021  ? HGB 11.4 (L) 10/19/2021  ? HCT 34.9 (L) 10/19/2021  ? MCV 89.9 10/19/2021  ? PLT 194 10/19/2021  ? ?Lab Results  ?Component Value Date  ? CREATININE 0.84 07/01/2021  ? BUN 17 07/01/2021  ? NA 139 07/01/2021  ? K 3.4 (L) 07/01/2021  ? CL 103 07/01/2021  ? CO2 27 07/01/2021  ? ? ?Anesthesia Physical ?Anesthesia Plan ? ?ASA: 2 ? ?Anesthesia Plan: General  ? ?Post-op Pain Management: Tylenol PO (pre-op)*  ? ?Induction: Intravenous ? ?PONV Risk Score and Plan: 3 and Dexamethasone, Ondansetron and Treatment may vary due to age or medical condition ? ?Airway Management Planned: Oral ETT ? ?Additional Equipment: None ? ?Intra-op Plan:  ? ?Post-operative Plan: Extubation in OR ? ?Informed Consent: I have reviewed the patients History and Physical, chart, labs and discussed the procedure including the risks, benefits and alternatives for the proposed anesthesia with the patient or authorized representative who has indicated his/her understanding and acceptance.  ? ? ? ?Dental advisory given ? ?Plan Discussed with: CRNA ? ?Anesthesia Plan Comments: (PAT note by Karoline Caldwell,  PA-C: ?Follows with cardiology for history of A-fib on Xarelto HLD, bradycardia.  Seen by Dr. Oval Linsey 09/07/2021 for preop evaluation.  Per note, due to low functional status, nuclear stress was recommended.  Echo also ordered to evaluate new lower extremity edema (felt likely related to venous insufficiency given no shortness of breath or orthopnea).  Nuclear stress was low risk and echo showed EF 60 to 65%, grade 1 DD.  She was subsequently cleared to undergo surgery in telephone encounter 09/23/2021.  She was cleared by pharmacy to hold Xarelto for 3 days. ? ?Patient reports last dose Xarelto 10/12/2021. ? ?CMP and CBC unable to be located by lab.  No results available.  Unclear what happened to sample.  Escalated to lab supervisor for further inquiry. Will need to be redrawn DOS.  ? ?EKG 09/07/2021: NSR.  Rate 66.  Abnormal QRST angle, consider primary T wave abnormality. ? ?Nuclear stress 09/17/2021: ?? ?The study is normal. The study is low risk. ?? ?No ST deviation was noted. ?? ?LV perfusion is normal. There is no evidence of ischemia. There is no evidence of infarction. ?? ?Left ventricular function is normal. Nuclear stress EF: 67 %. The left ventricular ejection fraction is hyperdynamic (>65%). End diastolic cavity size is normal. End systolic cavity size is normal. ?? ?Prior study available for comparison from 08/24/2015. No changes compared to prior study. ? ?TTE 09/17/2021: ??1. Left ventricular ejection fraction, by estimation,  is 60 to 65%. Left  ?ventricular ejection fraction by 3D volume is 70 %. The left ventricle has  ?normal function. The left ventricle has no regional wall motion  ?abnormalities. Left ventricular diastolic  ??parameters are consistent with Grade I diastolic dysfunction (impaired  ?relaxation). The average left ventricular global longitudinal strain is  ?23.2 %. The global longitudinal strain is normal.  ??2. Right ventricular systolic function is normal. The right ventricular  ?size  is normal. There is normal pulmonary artery systolic pressure. The  ?estimated right ventricular systolic pressure is 52.8 mmHg.  ??3. The mitral valve is normal in structure. Trivial mitral valve  ?regurgitation. No evidence of mitral stenosis.  ??4. The aortic valve is normal in structure. Aortic valve regurgitation is  ?trivial. No aortic stenosis is present.  ??5. The inferior vena cava is normal in size with greater than 50%  ?respiratory variability, suggesting right atrial pressure of 3 mmHg. ?)  ? ? ? ? ? ?Anesthesia Quick Evaluation ? ?

## 2021-10-14 NOTE — Progress Notes (Signed)
Anesthesia Chart Review: ? ?Follows with cardiology for history of A-fib on Xarelto HLD, bradycardia.  Seen by Dr. Oval Linsey 09/07/2021 for preop evaluation.  Per note, due to low functional status, nuclear stress was recommended.  Echo also ordered to evaluate new lower extremity edema (felt likely related to venous insufficiency given no shortness of breath or orthopnea).  Nuclear stress was low risk and echo showed EF 60 to 65%, grade 1 DD.  She was subsequently cleared to undergo surgery in telephone encounter 09/23/2021.  She was cleared by pharmacy to hold Xarelto for 3 days. ? ?Patient reports last dose Xarelto 10/12/2021. ? ?CMP and CBC unable to be located by lab.  No results available.  Unclear what happened to sample.  Escalated to lab supervisor for further inquiry. Will need to be redrawn DOS.  ? ?EKG 09/07/2021: NSR.  Rate 66.  Abnormal QRST angle, consider primary T wave abnormality. ? ?Nuclear stress 09/17/2021: ?  The study is normal. The study is low risk. ?  No ST deviation was noted. ?  LV perfusion is normal. There is no evidence of ischemia. There is no evidence of infarction. ?  Left ventricular function is normal. Nuclear stress EF: 67 %. The left ventricular ejection fraction is hyperdynamic (>65%). End diastolic cavity size is normal. End systolic cavity size is normal. ?  Prior study available for comparison from 08/24/2015. No changes compared to prior study. ? ?TTE 09/17/2021: ? 1. Left ventricular ejection fraction, by estimation, is 60 to 65%. Left  ?ventricular ejection fraction by 3D volume is 70 %. The left ventricle has  ?normal function. The left ventricle has no regional wall motion  ?abnormalities. Left ventricular diastolic  ? parameters are consistent with Grade I diastolic dysfunction (impaired  ?relaxation). The average left ventricular global longitudinal strain is  ?23.2 %. The global longitudinal strain is normal.  ? 2. Right ventricular systolic function is normal. The right  ventricular  ?size is normal. There is normal pulmonary artery systolic pressure. The  ?estimated right ventricular systolic pressure is 16.1 mmHg.  ? 3. The mitral valve is normal in structure. Trivial mitral valve  ?regurgitation. No evidence of mitral stenosis.  ? 4. The aortic valve is normal in structure. Aortic valve regurgitation is  ?trivial. No aortic stenosis is present.  ? 5. The inferior vena cava is normal in size with greater than 50%  ?respiratory variability, suggesting right atrial pressure of 3 mmHg. ? ? ? ?Karoline Caldwell, PA-C ?[facility]/Anesthesiology ?Phone [phone] ?10/14/2021 12:43 PM ? ?

## 2021-10-15 ENCOUNTER — Encounter: Payer: Self-pay | Admitting: Surgery

## 2021-10-15 ENCOUNTER — Ambulatory Visit: Payer: PPO | Admitting: Surgery

## 2021-10-15 VITALS — BP 147/71 | Ht 70.0 in | Wt 179.0 lb

## 2021-10-15 DIAGNOSIS — M4802 Spinal stenosis, cervical region: Secondary | ICD-10-CM

## 2021-10-15 NOTE — Progress Notes (Addendum)
88 year old female with history of C3-4, C4-5 and C5-6 HNP/stenosis comes in for preop evaluation.  States that symptoms unchanged from previous visit.  She is wanting to proceed with C3-4, C4-5 and C5-6 ACDF.  Today history and physical performed.  Review of systems negative.  We have received preop cardiac clearance.  Patient is on chronic Xarelto for atrial fibrillation and this is currently on hold.  Surgical procedure discussed in detail.  All questions answered. ?

## 2021-10-16 ENCOUNTER — Other Ambulatory Visit: Payer: Self-pay

## 2021-10-16 NOTE — H&P (Signed)
Claudia [name] is an 88 y.o. female.   ?Chief Complaint: Neck pain and upper extremity radiculopathy ?HPI: 88 year old female with history of C3-4, C4-5 and C5-6 HNP/stenosis comes in for preop evaluation.  States that symptoms unchanged from previous visit.  She is wanting to proceed with C3-4, C4-5 and C5-6 ACDF.  Today history and physical performed.  Review of systems negative.  We have received preop cardiac clearance.  Patient is on chronic Xarelto for atrial fibrillation and this is currently on hold ? ?Past Medical History:  ?Diagnosis Date  ? Abnormality of gait   ? Brachial neuritis or radiculitis NOS   ? Degeneration of lumbar or lumbosacral intervertebral disc   ? Dysrhythmia   ? Essential hypertension, benign   ? Gouty arthropathy   ? Lumbago   ? Obesity   ? Osteoarthritis   ? Pure hypercholesterolemia   ? Unspecified hypothyroidism   ? ? ?Past Surgical History:  ?Procedure Laterality Date  ? ABDOMINAL HYSTERECTOMY  03/13/2010  ? BACK SURGERY    ? LUMBAR LAMINECTOMY  03/13/2010  ? right knee replacement  03/13/2010  ? ? ?Family History  ?Problem Relation Age of Onset  ? Heart disease Father   ? Heart attack Father   ? Hypertension Mother   ? Alzheimer's disease Mother   ? Diabetes Sister   ? Cirrhosis Brother   ? Diabetes Sister   ? Diabetes Sister   ? Diabetes Brother   ? Heart attack Brother   ? Cancer Neg Hx   ? Kidney disease Neg Hx   ? ?Social History:  reports that she has never smoked. She has never used smokeless tobacco. She reports current alcohol use. She reports that she does not use drugs. ? ?Allergies:  ?Allergies  ?Allergen Reactions  ? Lipitor [Atorvastatin] Other (See Comments)  ?  Muscle aches  ? Trileptal [Oxcarbazepine] Other (See Comments)  ?  confusion  ? ? ?No medications prior to admission.  ? ? ?No results found for this or any previous visit (from the past 48 hour(s)). ?No results found. ? ?Review of Systems  ?Constitutional:  Positive for activity change.  ?HENT: Negative.     ?Respiratory: Negative.    ?Cardiovascular: Negative.   ?Gastrointestinal: Negative.   ?Musculoskeletal:  Positive for neck pain and neck stiffness.  ?Neurological:  Positive for numbness.  ? ?There were no vitals taken for this visit. ?Physical Exam ?HENT:  ?   Head: Normocephalic and atraumatic.  ?   Nose: Nose normal.  ?Eyes:  ?   Extraocular Movements: Extraocular movements intact.  ?Pulmonary:  ?   Effort: No respiratory distress.  ?Abdominal:  ?   General: Bowel sounds are normal. There is no distension.  ?   Tenderness: There is no abdominal tenderness.  ?Neurological:  ?   Mental Status: She is alert and oriented to person, place, and time.  ?Psychiatric:     ?   Mood and Affect: Mood normal.  ?  ? ?Assessment/Plan ?C3-4, C4-5 and C5-6 HNP/stenosis. ? ?We will proceed with C3-4, C4-5 and C5-6 ACDF is scheduled.  Surgical procedure discussed.  All questions answered. ? ?Benjiman Core, PA-C ?10/16/2021, 3:58 PM ? ? ? ?

## 2021-10-19 ENCOUNTER — Inpatient Hospital Stay (HOSPITAL_COMMUNITY): Payer: PPO

## 2021-10-19 ENCOUNTER — Encounter (HOSPITAL_COMMUNITY)
Admission: RE | Disposition: A | Discharge status: Home / Self Care | Payer: Self-pay | Source: Home / Self Care | Attending: Orthopaedic Surgery

## 2021-10-19 ENCOUNTER — Telehealth: Payer: Self-pay

## 2021-10-19 ENCOUNTER — Inpatient Hospital Stay (HOSPITAL_COMMUNITY): Payer: PPO | Admitting: Physician Assistant

## 2021-10-19 ENCOUNTER — Inpatient Hospital Stay (HOSPITAL_BASED_OUTPATIENT_CLINIC_OR_DEPARTMENT_OTHER): Payer: PPO | Admitting: Physician Assistant

## 2021-10-19 ENCOUNTER — Other Ambulatory Visit: Payer: Self-pay

## 2021-10-19 ENCOUNTER — Inpatient Hospital Stay (HOSPITAL_COMMUNITY)
Admission: RE | Admit: 2021-10-19 | Discharge: 2021-10-20 | DRG: 472 | Disposition: A | Discharge status: Home / Self Care | Payer: PPO | Attending: Orthopaedic Surgery | Admitting: Orthopaedic Surgery

## 2021-10-19 ENCOUNTER — Encounter (HOSPITAL_COMMUNITY): Payer: Self-pay | Admitting: Orthopaedic Surgery

## 2021-10-19 ENCOUNTER — Other Ambulatory Visit: Payer: Self-pay | Admitting: Internal Medicine

## 2021-10-19 DIAGNOSIS — Z888 Allergy status to other drugs, medicaments and biological substances status: Secondary | ICD-10-CM | POA: Diagnosis not present

## 2021-10-19 DIAGNOSIS — M4712 Other spondylosis with myelopathy, cervical region: Secondary | ICD-10-CM | POA: Diagnosis present

## 2021-10-19 DIAGNOSIS — Z7901 Long term (current) use of anticoagulants: Secondary | ICD-10-CM

## 2021-10-19 DIAGNOSIS — G9589 Other specified diseases of spinal cord: Secondary | ICD-10-CM | POA: Diagnosis not present

## 2021-10-19 DIAGNOSIS — M5412 Radiculopathy, cervical region: Secondary | ICD-10-CM | POA: Diagnosis present

## 2021-10-19 DIAGNOSIS — Z8249 Family history of ischemic heart disease and other diseases of the circulatory system: Secondary | ICD-10-CM

## 2021-10-19 DIAGNOSIS — I10 Essential (primary) hypertension: Secondary | ICD-10-CM | POA: Diagnosis present

## 2021-10-19 DIAGNOSIS — M961 Postlaminectomy syndrome, not elsewhere classified: Secondary | ICD-10-CM

## 2021-10-19 DIAGNOSIS — Z6825 Body mass index (BMI) 25.0-25.9, adult: Secondary | ICD-10-CM

## 2021-10-19 DIAGNOSIS — M50222 Other cervical disc displacement at C5-C6 level: Secondary | ICD-10-CM | POA: Diagnosis present

## 2021-10-19 DIAGNOSIS — Z833 Family history of diabetes mellitus: Secondary | ICD-10-CM | POA: Diagnosis not present

## 2021-10-19 DIAGNOSIS — Z01818 Encounter for other preprocedural examination: Secondary | ICD-10-CM

## 2021-10-19 DIAGNOSIS — N882 Stricture and stenosis of cervix uteri: Secondary | ICD-10-CM | POA: Diagnosis present

## 2021-10-19 DIAGNOSIS — I4891 Unspecified atrial fibrillation: Secondary | ICD-10-CM | POA: Diagnosis present

## 2021-10-19 DIAGNOSIS — E7800 Pure hypercholesterolemia, unspecified: Secondary | ICD-10-CM | POA: Diagnosis present

## 2021-10-19 DIAGNOSIS — E669 Obesity, unspecified: Secondary | ICD-10-CM | POA: Diagnosis present

## 2021-10-19 DIAGNOSIS — N1832 Chronic kidney disease, stage 3b: Secondary | ICD-10-CM

## 2021-10-19 DIAGNOSIS — Z820 Family history of epilepsy and other diseases of the nervous system: Secondary | ICD-10-CM

## 2021-10-19 DIAGNOSIS — Z96651 Presence of right artificial knee joint: Secondary | ICD-10-CM | POA: Diagnosis present

## 2021-10-19 DIAGNOSIS — M4802 Spinal stenosis, cervical region: Secondary | ICD-10-CM

## 2021-10-19 DIAGNOSIS — Z981 Arthrodesis status: Secondary | ICD-10-CM | POA: Diagnosis not present

## 2021-10-19 DIAGNOSIS — M170 Bilateral primary osteoarthritis of knee: Secondary | ICD-10-CM

## 2021-10-19 DIAGNOSIS — M47817 Spondylosis without myelopathy or radiculopathy, lumbosacral region: Secondary | ICD-10-CM

## 2021-10-19 HISTORY — PX: ANTERIOR CERVICAL DECOMP/DISCECTOMY FUSION: SHX1161

## 2021-10-19 LAB — CBC
HCT: 34.9 % — ABNORMAL LOW (ref 36.0–46.0)
Hemoglobin: 11.4 g/dL — ABNORMAL LOW (ref 12.0–15.0)
MCH: 29.4 pg (ref 26.0–34.0)
MCHC: 32.7 g/dL (ref 30.0–36.0)
MCV: 89.9 fL (ref 80.0–100.0)
Platelets: 194 10*3/uL (ref 150–400)
RBC: 3.88 MIL/uL (ref 3.87–5.11)
RDW: 14.4 % (ref 11.5–15.5)
WBC: 4.9 10*3/uL (ref 4.0–10.5)
nRBC: 0 % (ref 0.0–0.2)

## 2021-10-19 LAB — COMPREHENSIVE METABOLIC PANEL
ALT: 11 U/L (ref 0–44)
AST: 14 U/L — ABNORMAL LOW (ref 15–41)
Albumin: 3.3 g/dL — ABNORMAL LOW (ref 3.5–5.0)
Alkaline Phosphatase: 40 U/L (ref 38–126)
Anion gap: 7 (ref 5–15)
BUN: 14 mg/dL (ref 8–23)
CO2: 24 mmol/L (ref 22–32)
Calcium: 9.7 mg/dL (ref 8.9–10.3)
Chloride: 112 mmol/L — ABNORMAL HIGH (ref 98–111)
Creatinine, Ser: 1.05 mg/dL — ABNORMAL HIGH (ref 0.44–1.00)
GFR, Estimated: 52 mL/min — ABNORMAL LOW (ref >60)
Glucose, Bld: 56 mg/dL — ABNORMAL LOW (ref 70–99)
Potassium: 3.7 mmol/L (ref 3.5–5.1)
Sodium: 143 mmol/L (ref 135–145)
Total Bilirubin: 1.5 mg/dL — ABNORMAL HIGH (ref 0.3–1.2)
Total Protein: 5.6 g/dL — ABNORMAL LOW (ref 6.5–8.1)

## 2021-10-19 LAB — SURGICAL PCR SCREEN
MRSA, PCR: NEGATIVE
Staphylococcus aureus: NEGATIVE

## 2021-10-19 SURGERY — ANTERIOR CERVICAL DECOMPRESSION/DISCECTOMY FUSION 3 LEVELS
Anesthesia: General | Site: Neck

## 2021-10-19 MED ORDER — BUPIVACAINE-EPINEPHRINE 0.5% -1:200000 IJ SOLN
INTRAMUSCULAR | Status: DC | PRN
  Administered 2021-10-19: 6 mL

## 2021-10-19 MED ORDER — ONDANSETRON HCL 4 MG/2ML IJ SOLN
INTRAMUSCULAR | Status: DC | PRN
  Administered 2021-10-19: 4 mg via INTRAVENOUS

## 2021-10-19 MED ORDER — FENTANYL CITRATE (PF) 250 MCG/5ML IJ SOLN
INTRAMUSCULAR | Status: DC | PRN
  Administered 2021-10-19: 100 ug via INTRAVENOUS
  Administered 2021-10-19 (×3): 50 ug via INTRAVENOUS

## 2021-10-19 MED ORDER — METHOCARBAMOL 1000 MG/10ML IJ SOLN
500.0000 mg | Freq: Four times a day (QID) | INTRAVENOUS | Status: DC | PRN
  Filled 2021-10-19: qty 5

## 2021-10-19 MED ORDER — SODIUM CHLORIDE 0.9% FLUSH
3.0000 mL | Freq: Two times a day (BID) | INTRAVENOUS | Status: DC
  Administered 2021-10-19 (×2): 3 mL via INTRAVENOUS

## 2021-10-19 MED ORDER — RIVAROXABAN 20 MG PO TABS
20.0000 mg | ORAL_TABLET | Freq: Every day | ORAL | Status: DC
  Filled 2021-10-19: qty 1

## 2021-10-19 MED ORDER — HYDROMORPHONE HCL 1 MG/ML IJ SOLN: 0.5000 mg | INTRAMUSCULAR | Status: DC | PRN

## 2021-10-19 MED ORDER — ONDANSETRON HCL 4 MG/2ML IJ SOLN: 4.0000 mg | Freq: Four times a day (QID) | INTRAMUSCULAR | Status: DC | PRN

## 2021-10-19 MED ORDER — CEFAZOLIN SODIUM 1 G IJ SOLR
INTRAMUSCULAR | Status: AC
  Filled 2021-10-19: qty 20

## 2021-10-19 MED ORDER — MENTHOL 3 MG MT LOZG: 1.0000 | LOZENGE | OROMUCOSAL | Status: DC | PRN

## 2021-10-19 MED ORDER — ACETAMINOPHEN 325 MG PO TABS: 650.0000 mg | ORAL_TABLET | ORAL | Status: DC | PRN

## 2021-10-19 MED ORDER — ACETAMINOPHEN 500 MG PO TABS: 1000.0000 mg | ORAL_TABLET | Freq: Once | ORAL | Status: AC

## 2021-10-19 MED ORDER — PROPOFOL 10 MG/ML IV BOLUS
INTRAVENOUS | Status: DC | PRN
  Administered 2021-10-19: 130 mg via INTRAVENOUS

## 2021-10-19 MED ORDER — ACETAMINOPHEN 500 MG PO TABS
ORAL_TABLET | ORAL | Status: AC
  Administered 2021-10-19: 1000 mg via ORAL
  Filled 2021-10-19: qty 2

## 2021-10-19 MED ORDER — LIDOCAINE 2% (20 MG/ML) 5 ML SYRINGE
INTRAMUSCULAR | Status: DC | PRN
  Administered 2021-10-19: 60 mg via INTRAVENOUS

## 2021-10-19 MED ORDER — FLUCONAZOLE 150 MG PO TABS
150.0000 mg | ORAL_TABLET | Freq: Once | ORAL | Status: AC
  Administered 2021-10-19: 150 mg via ORAL
  Filled 2021-10-19: qty 1

## 2021-10-19 MED ORDER — SODIUM CHLORIDE 0.9 % IV SOLN: 250.0000 mL | INTRAVENOUS | Status: DC

## 2021-10-19 MED ORDER — BUPIVACAINE-EPINEPHRINE 0.5% -1:200000 IJ SOLN
INTRAMUSCULAR | Status: AC
  Filled 2021-10-19: qty 1

## 2021-10-19 MED ORDER — DEXAMETHASONE SODIUM PHOSPHATE 10 MG/ML IJ SOLN
INTRAMUSCULAR | Status: DC | PRN
  Administered 2021-10-19: 10 mg via INTRAVENOUS

## 2021-10-19 MED ORDER — HYDROCODONE-ACETAMINOPHEN 7.5-325 MG PO TABS
1.0000 | ORAL_TABLET | ORAL | Status: DC | PRN
  Administered 2021-10-19 (×2): 1 via ORAL
  Filled 2021-10-19 (×2): qty 1

## 2021-10-19 MED ORDER — SODIUM CHLORIDE 0.9% FLUSH: 3.0000 mL | INTRAVENOUS | Status: DC | PRN

## 2021-10-19 MED ORDER — LACTATED RINGERS IV SOLN: INTRAVENOUS | Status: DC

## 2021-10-19 MED ORDER — FENTANYL CITRATE (PF) 250 MCG/5ML IJ SOLN
INTRAMUSCULAR | Status: AC
  Filled 2021-10-19: qty 5

## 2021-10-19 MED ORDER — DOCUSATE SODIUM 100 MG PO CAPS
100.0000 mg | ORAL_CAPSULE | Freq: Two times a day (BID) | ORAL | Status: DC
  Administered 2021-10-19 – 2021-10-20 (×2): 100 mg via ORAL
  Filled 2021-10-19 (×2): qty 1

## 2021-10-19 MED ORDER — POLYETHYLENE GLYCOL 3350 17 G PO PACK
17.0000 g | PACK | Freq: Every day | ORAL | Status: DC
  Administered 2021-10-19: 17 g via ORAL
  Filled 2021-10-19 (×2): qty 1

## 2021-10-19 MED ORDER — SODIUM CHLORIDE 0.9 % IV SOLN: INTRAVENOUS | Status: DC

## 2021-10-19 MED ORDER — ROCURONIUM BROMIDE 10 MG/ML (PF) SYRINGE
PREFILLED_SYRINGE | INTRAVENOUS | Status: DC | PRN
  Administered 2021-10-19: 30 mg via INTRAVENOUS
  Administered 2021-10-19: 60 mg via INTRAVENOUS

## 2021-10-19 MED ORDER — PHENOL 1.4 % MT LIQD: 1.0000 | OROMUCOSAL | Status: DC | PRN

## 2021-10-19 MED ORDER — CHLORHEXIDINE GLUCONATE 0.12 % MT SOLN: 15.0000 mL | Freq: Once | OROMUCOSAL | Status: AC

## 2021-10-19 MED ORDER — ACETAMINOPHEN 650 MG RE SUPP: 650.0000 mg | RECTAL | Status: DC | PRN

## 2021-10-19 MED ORDER — AMISULPRIDE (ANTIEMETIC) 5 MG/2ML IV SOLN: 10.0000 mg | Freq: Once | INTRAVENOUS | Status: DC | PRN

## 2021-10-19 MED ORDER — TORSEMIDE 20 MG PO TABS
20.0000 mg | ORAL_TABLET | Freq: Every day | ORAL | Status: DC
  Administered 2021-10-19: 20 mg via ORAL
  Filled 2021-10-19: qty 1

## 2021-10-19 MED ORDER — PROPOFOL 500 MG/50ML IV EMUL
INTRAVENOUS | Status: DC | PRN
  Administered 2021-10-19: 50 ug/kg/min via INTRAVENOUS

## 2021-10-19 MED ORDER — METHOCARBAMOL 500 MG PO TABS
500.0000 mg | ORAL_TABLET | Freq: Four times a day (QID) | ORAL | Status: DC | PRN
  Administered 2021-10-19 – 2021-10-20 (×2): 500 mg via ORAL
  Filled 2021-10-19 (×2): qty 1

## 2021-10-19 MED ORDER — DILTIAZEM HCL 30 MG PO TABS
30.0000 mg | ORAL_TABLET | ORAL | Status: DC | PRN
  Filled 2021-10-19: qty 1

## 2021-10-19 MED ORDER — PHENYLEPHRINE HCL-NACL 20-0.9 MG/250ML-% IV SOLN
INTRAVENOUS | Status: DC | PRN
  Administered 2021-10-19: 20 ug/min via INTRAVENOUS

## 2021-10-19 MED ORDER — ONDANSETRON HCL 4 MG PO TABS: 4.0000 mg | ORAL_TABLET | Freq: Four times a day (QID) | ORAL | Status: DC | PRN

## 2021-10-19 MED ORDER — ORAL CARE MOUTH RINSE: 15.0000 mL | Freq: Once | OROMUCOSAL | Status: AC

## 2021-10-19 MED ORDER — LINACLOTIDE 145 MCG PO CAPS
290.0000 ug | ORAL_CAPSULE | Freq: Every day | ORAL | Status: DC
  Administered 2021-10-20: 290 ug via ORAL
  Filled 2021-10-19: qty 2

## 2021-10-19 MED ORDER — LACTATED RINGERS IV SOLN: INTRAVENOUS | Status: DC | PRN

## 2021-10-19 MED ORDER — CHLORHEXIDINE GLUCONATE 0.12 % MT SOLN
OROMUCOSAL | Status: AC
  Administered 2021-10-19: 15 mL via OROMUCOSAL
  Filled 2021-10-19: qty 15

## 2021-10-19 MED ORDER — SURGIFLO WITH THROMBIN (HEMOSTATIC MATRIX KIT) OPTIME
TOPICAL | Status: DC | PRN
  Administered 2021-10-19: 1 via TOPICAL

## 2021-10-19 MED ORDER — FENTANYL CITRATE (PF) 100 MCG/2ML IJ SOLN: 25.0000 ug | INTRAMUSCULAR | Status: DC | PRN

## 2021-10-19 MED ORDER — CEFAZOLIN SODIUM-DEXTROSE 2-4 GM/100ML-% IV SOLN
INTRAVENOUS | Status: AC
  Filled 2021-10-19: qty 100

## 2021-10-19 MED ORDER — SUGAMMADEX SODIUM 200 MG/2ML IV SOLN
INTRAVENOUS | Status: DC | PRN
  Administered 2021-10-19: 321.2 mg via INTRAVENOUS

## 2021-10-19 MED ORDER — CEFAZOLIN SODIUM-DEXTROSE 2-4 GM/100ML-% IV SOLN
2.0000 g | INTRAVENOUS | Status: AC
  Administered 2021-10-19: 2 g via INTRAVENOUS

## 2021-10-19 SURGICAL SUPPLY — 57 items
BAG COUNTER SPONGE SURGICOUNT (BAG) ×1 IMPLANT
BENZOIN TINCTURE PRP APPL 2/3 (GAUZE/BANDAGES/DRESSINGS) ×2 IMPLANT
BIT DRILL SM SPINE QC 12 (BIT) ×1 IMPLANT
BONE CC-ACS 11X14X7 6D (Bone Implant) ×6 IMPLANT
BUR ROUND FLUTED 4 SOFT TCH (BURR) ×2 IMPLANT
CATH FOLEY 2WAY SLVR  5CC 14FR (CATHETERS) ×1
CATH FOLEY 2WAY SLVR 5CC 14FR (CATHETERS) IMPLANT
CHIPS BONE CANC-ACS11X14X7 6D (Bone Implant) IMPLANT
COLLAR CERV LO CONTOUR FIRM DE (SOFTGOODS) ×2 IMPLANT
CORD BIPOLAR FORCEPS 12FT (ELECTRODE) IMPLANT
COVER SURGICAL LIGHT HANDLE (MISCELLANEOUS) ×2 IMPLANT
DRAPE C-ARM 42X72 X-RAY (DRAPES) ×2 IMPLANT
DRAPE HALF SHEET 40X57 (DRAPES) ×2 IMPLANT
DRAPE MICROSCOPE LEICA (MISCELLANEOUS) ×2 IMPLANT
DURAPREP 6ML APPLICATOR 50/CS (WOUND CARE) ×2 IMPLANT
ELECT COATED BLADE 2.86 ST (ELECTRODE) ×2 IMPLANT
ELECT REM PT RETURN 9FT ADLT (ELECTROSURGICAL) ×2
ELECTRODE REM PT RTRN 9FT ADLT (ELECTROSURGICAL) ×1 IMPLANT
EVACUATOR 1/8 PVC DRAIN (DRAIN) ×1 IMPLANT
GAUZE SPONGE 4X4 12PLY STRL (GAUZE/BANDAGES/DRESSINGS) ×2 IMPLANT
GAUZE XEROFORM 1X8 LF (GAUZE/BANDAGES/DRESSINGS) ×2 IMPLANT
GLOVE BIOGEL M 7.0 STRL (GLOVE) ×1 IMPLANT
GLOVE BIOGEL PI IND STRL 8 (GLOVE) ×2 IMPLANT
GLOVE BIOGEL PI INDICATOR 8 (GLOVE) ×2
GLOVE ORTHO TXT STRL SZ7.5 (GLOVE) ×4 IMPLANT
GOWN SRG XL LVL 4 BRTHBL STRL (GOWNS) IMPLANT
GOWN STRL NON-REIN XL LVL4 (GOWNS) ×2
GOWN STRL REUS W/ TWL LRG LVL3 (GOWN DISPOSABLE) ×1 IMPLANT
GOWN STRL REUS W/ TWL XL LVL3 (GOWN DISPOSABLE) ×1 IMPLANT
GOWN STRL REUS W/TWL 2XL LVL3 (GOWN DISPOSABLE) ×2 IMPLANT
GOWN STRL REUS W/TWL LRG LVL3 (GOWN DISPOSABLE) ×1
GOWN STRL REUS W/TWL XL LVL3 (GOWN DISPOSABLE) ×1
HALTER HD/CHIN CERV TRACTION D (MISCELLANEOUS) ×2 IMPLANT
HEMOSTAT SURGICEL 2X14 (HEMOSTASIS) IMPLANT
KIT BASIN OR (CUSTOM PROCEDURE TRAY) ×2 IMPLANT
KIT TURNOVER KIT B (KITS) ×2 IMPLANT
NDL 25GX 5/8IN NON SAFETY (NEEDLE) ×1 IMPLANT
NEEDLE 25GX 5/8IN NON SAFETY (NEEDLE) ×2 IMPLANT
NS IRRIG 1000ML POUR BTL (IV SOLUTION) ×2 IMPLANT
PACK ORTHO CERVICAL (CUSTOM PROCEDURE TRAY) ×2 IMPLANT
PAD ARMBOARD 7.5X6 YLW CONV (MISCELLANEOUS) ×4 IMPLANT
PATTIES SURGICAL .5 X.5 (GAUZE/BANDAGES/DRESSINGS) IMPLANT
PIN FIXATION TEMP W/SHOULDER (PIN) ×1 IMPLANT
PLATE ANT CERV XTEND 3 LV 45 (Plate) ×1 IMPLANT
POSITIONER HEAD DONUT 9IN (MISCELLANEOUS) ×2 IMPLANT
SCREW XTD VAR 4.2 SELF TAP 12 (Screw) ×8 IMPLANT
SPONGE T-LAP 4X18 ~~LOC~~+RFID (SPONGE) ×2 IMPLANT
STRIP CLOSURE SKIN 1/2X4 (GAUZE/BANDAGES/DRESSINGS) ×2 IMPLANT
SURGIFLO W/THROMBIN 8M KIT (HEMOSTASIS) ×1 IMPLANT
SUT BONE WAX W31G (SUTURE) ×3 IMPLANT
SUT VIC AB 3-0 X1 27 (SUTURE) ×2 IMPLANT
SUT VICRYL 4-0 PS2 18IN ABS (SUTURE) ×4 IMPLANT
SYR 30ML SLIP (SYRINGE) ×2 IMPLANT
SYR BULB EAR ULCER 3OZ GRN STR (SYRINGE) ×1 IMPLANT
TOWEL GREEN STERILE (TOWEL DISPOSABLE) ×2 IMPLANT
TOWEL GREEN STERILE FF (TOWEL DISPOSABLE) ×2 IMPLANT
WATER STERILE IRR 1000ML POUR (IV SOLUTION) ×2 IMPLANT

## 2021-10-19 NOTE — Interval H&P Note (Signed)
History and Physical Interval Note: ? ?10/19/2021 ?7:28 AM ? ?Claudia [name]  has presented today for surgery, with the diagnosis of C3-4, C4-5, C5-6 cervical stenosis, myelomalacia.  The various methods of treatment have been discussed with the patient and family. After consideration of risks, benefits and other options for treatment, the patient has consented to  Procedure(s): ?C3-4, C4-5, C5-6 ANTERIOR CERVICAL DISCECTOMY FUSION, ALLOGRAFT, PLATE (N/A) as a surgical intervention.  The patient's history has been reviewed, patient examined, no change in status, stable for surgery.  I have reviewed the patient's chart and labs.  Questions were answered to the patient's satisfaction.   ? ? ?[name] ? ? ?

## 2021-10-19 NOTE — Progress Notes (Signed)
Orthopedic Tech Progress Note ?Patient Details:  ?Claudia [name] ?Dec 05, 1933 ?[id number] ? ?PACU RN stated "patient has on soft collar" ? ?Patient ID: Claudia [name], female   DOB: 03-29-1934, 88 y.o.   MRN: [id number] ? ?Janit Pagan ?10/19/2021, 11:28 AM ? ?

## 2021-10-19 NOTE — Anesthesia Procedure Notes (Signed)
Procedure Name: Intubation ?Date/Time: 10/19/2021 7:50 AM ?Performed by: Minerva Ends, CRNA ?Pre-anesthesia Checklist: Patient identified, Emergency Drugs available, Suction available and Patient being monitored ?Patient Re-evaluated:Patient Re-evaluated prior to induction ?Oxygen Delivery Method: Circle system utilized ?Preoxygenation: Pre-oxygenation with 100% oxygen ?Induction Type: IV induction ?Ventilation: Mask ventilation without difficulty ?Laryngoscope Size: Mac, 3 and Glidescope ?Grade View: Grade I ?Tube type: Oral ?Tube size: 7.0 mm ?Number of attempts: 1 ?Airway Equipment and Method: Stylet and Oral airway ?Placement Confirmation: ETT inserted through vocal cords under direct vision, positive ETCO2 and breath sounds checked- equal and bilateral ?Secured at: 22 cm ?Tube secured with: Tape ?Dental Injury: Teeth and Oropharynx as per pre-operative assessment  ? ? ? ? ?

## 2021-10-19 NOTE — Telephone Encounter (Signed)
Debbie with [facility] Utilization [facility] would like to know if patient can be changed to Inpatient?  CB# [phone].  Please advise.  Thank you. ?

## 2021-10-19 NOTE — Anesthesia Postprocedure Evaluation (Signed)
Anesthesia Post Note ? ?Patient: ZYKERA ABELLA ? ?Procedure(s) Performed: C3-4, C4-5, C5-6 ANTERIOR CERVICAL DISCECTOMY FUSION, ALLOGRAFT, PLATE (Neck) ? ?  ? ?Patient location during evaluation: PACU ?Anesthesia Type: General ?Level of consciousness: awake and alert ?Pain management: pain level controlled ?Vital Signs Assessment: post-procedure vital signs reviewed and stable ?Respiratory status: spontaneous breathing, nonlabored ventilation, respiratory function stable and patient connected to nasal cannula oxygen ?Cardiovascular status: blood pressure returned to baseline and stable ?Postop Assessment: no apparent nausea or vomiting ?Anesthetic complications: no ? ? ?No notable events documented. ? ?Last Vitals:  ?Vitals:  ? 10/19/21 1205 10/19/21 1242  ?BP: (!) 144/63 (!) 168/79  ?Pulse: 62 63  ?Resp: 15 18  ?Temp: (!) 36.3 ?C 36.6 ?C  ?SpO2: 98% 99%  ?  ?Last Pain:  ?Vitals:  ? 10/19/21 1242  ?TempSrc:   ?PainSc: 0-No pain  ? ? ?  ?  ?  ?  ?  ?  ? ?Suzette Battiest E ? ? ? ? ?

## 2021-10-19 NOTE — Transfer of Care (Signed)
Immediate Anesthesia Transfer of Care Note ? ?Patient: Claudia [name] ? ?Procedure(s) Performed: C3-4, C4-5, C5-6 ANTERIOR CERVICAL DISCECTOMY FUSION, ALLOGRAFT, PLATE (Neck) ? ?Patient Location: PACU ? ?Anesthesia Type:General ? ?Level of Consciousness: sedated ? ?Airway & Oxygen Therapy: Patient Spontanous Breathing ? ?Post-op Assessment: Report given to RN and Post -op Vital signs reviewed and stable ? ?Post vital signs: Reviewed and stable ? ?Last Vitals:  ?Vitals Value Taken Time  ?BP 127/58 10/19/21 1120  ?Temp    ?Pulse 56 10/19/21 1120  ?Resp 16 10/19/21 1120  ?SpO2 97 % 10/19/21 1120  ?Vitals shown include unvalidated device data. ? ?Last Pain:  ?Vitals:  ? 10/19/21 0604  ?TempSrc:   ?PainSc: 0-No pain  ?   ? ?  ? ?Complications: No notable events documented. ?

## 2021-10-20 ENCOUNTER — Telehealth: Payer: Self-pay | Admitting: Pharmacist

## 2021-10-20 ENCOUNTER — Encounter (HOSPITAL_COMMUNITY): Payer: Self-pay | Admitting: Orthopaedic Surgery

## 2021-10-20 DIAGNOSIS — N882 Stricture and stenosis of cervix uteri: Secondary | ICD-10-CM | POA: Diagnosis present

## 2021-10-20 DIAGNOSIS — I10 Essential (primary) hypertension: Secondary | ICD-10-CM | POA: Diagnosis present

## 2021-10-20 DIAGNOSIS — E7800 Pure hypercholesterolemia, unspecified: Secondary | ICD-10-CM | POA: Diagnosis present

## 2021-10-20 DIAGNOSIS — Z6825 Body mass index (BMI) 25.0-25.9, adult: Secondary | ICD-10-CM | POA: Diagnosis not present

## 2021-10-20 DIAGNOSIS — M50222 Other cervical disc displacement at C5-C6 level: Secondary | ICD-10-CM | POA: Diagnosis present

## 2021-10-20 DIAGNOSIS — Z833 Family history of diabetes mellitus: Secondary | ICD-10-CM | POA: Diagnosis not present

## 2021-10-20 DIAGNOSIS — M5412 Radiculopathy, cervical region: Secondary | ICD-10-CM | POA: Diagnosis present

## 2021-10-20 DIAGNOSIS — I4891 Unspecified atrial fibrillation: Secondary | ICD-10-CM | POA: Diagnosis present

## 2021-10-20 DIAGNOSIS — M4712 Other spondylosis with myelopathy, cervical region: Secondary | ICD-10-CM | POA: Diagnosis present

## 2021-10-20 DIAGNOSIS — Z96651 Presence of right artificial knee joint: Secondary | ICD-10-CM | POA: Diagnosis present

## 2021-10-20 DIAGNOSIS — Z8249 Family history of ischemic heart disease and other diseases of the circulatory system: Secondary | ICD-10-CM | POA: Diagnosis not present

## 2021-10-20 DIAGNOSIS — Z888 Allergy status to other drugs, medicaments and biological substances status: Secondary | ICD-10-CM | POA: Diagnosis not present

## 2021-10-20 DIAGNOSIS — Z820 Family history of epilepsy and other diseases of the nervous system: Secondary | ICD-10-CM | POA: Diagnosis not present

## 2021-10-20 DIAGNOSIS — M4802 Spinal stenosis, cervical region: Secondary | ICD-10-CM | POA: Diagnosis present

## 2021-10-20 DIAGNOSIS — Z7901 Long term (current) use of anticoagulants: Secondary | ICD-10-CM | POA: Diagnosis not present

## 2021-10-20 DIAGNOSIS — E669 Obesity, unspecified: Secondary | ICD-10-CM | POA: Diagnosis present

## 2021-10-20 LAB — BASIC METABOLIC PANEL WITH GFR
Anion gap: 5 (ref 5–15)
BUN: 11 mg/dL (ref 8–23)
CO2: 28 mmol/L (ref 22–32)
Calcium: 9.8 mg/dL (ref 8.9–10.3)
Chloride: 106 mmol/L (ref 98–111)
Creatinine, Ser: 1.18 mg/dL — ABNORMAL HIGH (ref 0.44–1.00)
GFR, Estimated: 45 mL/min — ABNORMAL LOW
Glucose, Bld: 122 mg/dL — ABNORMAL HIGH (ref 70–99)
Potassium: 4.4 mmol/L (ref 3.5–5.1)
Sodium: 139 mmol/L (ref 135–145)

## 2021-10-20 LAB — CBC
HCT: 32.2 % — ABNORMAL LOW (ref 36.0–46.0)
Hemoglobin: 10.6 g/dL — ABNORMAL LOW (ref 12.0–15.0)
MCH: 29.4 pg (ref 26.0–34.0)
MCHC: 32.9 g/dL (ref 30.0–36.0)
MCV: 89.4 fL (ref 80.0–100.0)
Platelets: 191 10*3/uL (ref 150–400)
RBC: 3.6 MIL/uL — ABNORMAL LOW (ref 3.87–5.11)
RDW: 14.2 % (ref 11.5–15.5)
WBC: 7.9 10*3/uL (ref 4.0–10.5)
nRBC: 0 % (ref 0.0–0.2)

## 2021-10-20 NOTE — Progress Notes (Signed)
Patient awaiting to be transported to her vehicle via wheelchair by family friend for discharge home; in no acute complaints of pain nor discomfort; moves all extremities well; incision on her right anterior neck with honeycomb dressing and is clean, dry and intact with soft collar on; room was checked for all belongings and were taken along with her; discharge instructions concerning her medications, incision care, follow up appointment and when to call the doctor as needed were all discussed with patient and her family friend by RN and both expressed understanding on the instructions given.  ?

## 2021-10-20 NOTE — Progress Notes (Signed)
Orthopedic Tech Progress Note ?Patient Details:  ?Claudia [name] ?[birth year] ?[id number] ? ?RN called requesting an [place] for patient when she goes home  ? ?Ortho Devices ?Type of Ortho Device: Soft collar ?Ortho Device/Splint Location: NECK ?Ortho Device/Splint Interventions: Ordered, Other (comment) ?  ?Post Interventions ?Patient Tolerated: Well ?Instructions Provided: Care of device ? ?Janit Pagan ?10/20/2021, 12:36 PM ? ?

## 2021-10-20 NOTE — Op Note (Signed)
Pre and postop diagnosis: Cervical spondylosis, cervical stenosis with myelopathy C3-4, C4-5, C5-6. ? ?Procedure: 3 level cervical fusion C3-4, C4-5, C5-6 anterior cervical discectomy and fusion, allograft and plate. ? ?Surgeon: Rodell Perna, MD ? ?Assistant: Benjiman Core, PA-C medically necessary and present for the entire procedure ? ?EBL: 150 cc ? ?Anesthesia: General orotracheal +6 cc Marcaine local. ?Implants ? ?BONE CC-ACS [id number] 6D - [id number] ? ?Inventory Item: BONE CC-ACS [id number] 6D Serial no.: [id number] Model/Cat no.: [id number]  ?Implant name: BONE CC-ACS [id number] 6D - [id number] Laterality: N/A Area: Cervical Level 4-5  ?Manufacturer: Gerilyn Nestle FNDN Date of Manufacture:    ?Action: Implanted Number Used: 1   ?Device Identifier:  Device Identifier Type:    ? ?BONE CC-ACS [id number] 6D - [id number] ? ?Inventory Item: BONE CC-ACS [id number] 6D Serial no.: [id number] Model/Cat no.: [id number]  ?Implant name: BONE CC-ACS [id number] 6D - [id number] Laterality: N/A Area: Cervical Level 5-6  ?Manufacturer: Gerilyn Nestle FNDN Date of Manufacture:    ?Action: Implanted Number Used: 1   ?Device Identifier:  Device Identifier Type:    ? ?BONE CC-ACS [id number] 6D - [id number] ? ?Inventory Item: BONE CC-ACS [id number] 6D Serial no.: [id number] Model/Cat no.: [id number]  ?Implant name: BONE CC-ACS [id number] 6D - [id number] Laterality: N/A Area: Cervical Level 3-4  ?Manufacturer: Gerilyn Nestle FNDN Date of Manufacture:    ?Action: Implanted Number Used: 1   ?Device Identifier:  Device Identifier Type:    ? ?PLATE ANT CERV XTEND 3 LV 45 - [id number] ? ?Inventory Item: PLATE ANT CERV XTEND 3 LV 45 Serial no.:  Model/Cat no.: [id number]  ?Implant name: PLATE ANT CERV XTEND 3 LV 45 - [id number] Laterality: N/A Area: Spine Cervical  ?Manufacturer: [place] Date of Manufacture:    ?Action: Implanted Number Used: 1   ?Device Identifier:  Device Identifier Type:    ? ?SCREW  XTD VAR 4.2 SELF TAP 12 - [id number] ? ?Inventory Item: SCREW XTD VAR 4.2 SELF TAP 12 Serial no.:  Model/Cat no.: [id number]  ?Implant name: SCREW XTD VAR 4.2 SELF TAP 12 - [id number] Laterality: N/A Area: Spine Cervical  ?Manufacturer: [place] Date of Manufacture:    ?Action: Implanted Number Used: 8   ?Device Identifier:  Device Identifier Type:    ? ?Brief history 88 year old female recommended surgery by Dr. Cyndy Freeze in 2017.  She had a husband with Alzheimer's who is currently in the hospital and she had delayed surgery while she took care of him.  She has had progressive myelopathic changes in her cord with numbness in both hands and some gait difficulty.  In addition she has a small meningioma slow-growing.  MRI showed abnormal T2 signal in the cord with stenosis at the 3 levels. ? ?Procedure: After induction of general anesthesia a glide scope intubation had ultra traction without weights wrist restraints for pulldown during fluoroscopic visualization during the surgery next prepped with DuraPrep the usual towels were squared sterile skin marker Betadine Steri-Drape sterile Mayo stand the head and thyroid sheets and drapes.  After timeout procedure Ancef prophylaxis incision was started at the midline extending to the left.  Platysma was split blunt dissection down the longus Coley undermining underneath the omohyoid.  Short needle was placed at C4-5 confirmed with a lateral C arm sterilely draped.  Disc was marked Cloward retractors placed and C4-5 level was addressed first.  Operative microscope was used and we progressed down to the posterior spurs with decompression.  Additional bone was removed for decompression and piece of disc and  spurs removed decompressing the dura and remove the posterior longitudinal ligament.  Uncovertebral joints were stripped there was prominent spurs anteriorly and the bur had to be used to smooth down the spurs anteriorly so that the plate would sit flat on the bone.  Next the  C5-6 level was surgically addressed after 7 mm graft had been impacted into the C4-5 level.  Some Surgi-Flo was used for epidural using.  At C5-6 level another 7 mm graft was placed and there was more severe disc extrusion at this level causing severe stenosis eccentric primarily more on the left than right.  Posterior longitudinal ligament was taken down spurs removed and some millimeter graft was placed at this level as well.  Soft and Cloward retractors moved up after dividing the omohyoid for exposure.  Decompression at C3-4 removing the spurs.  Posterior longitudinal and was removed using 1 and 2 mm Kerrisons as well as Karlin curettes.  Once dura was decompressed graft was placed and also was 7 mm.  An 8 with fit but there was concern that with the TT changes in the cord to telegraph may over distract the cord with 3 level fusion.  7 graft was tight and with the trial and side it was not easily removed.  Some millimeter graft fit snugly and again CRNA pulled traction his graft was countersunk 1 to 2 mm with nice tight fit.  A globus plate was selected.  The 3 level 45 mm plate fit but screws in the C4 vertebrae had been angled slightly caudad to catch the vertebral body since bony been removed above below the disc space to decompress remove the spurs and disc that was causing compression of the cord.  Fluoroscopic visualization showed good position screws were adjusted and once final images were obtained the left locking screwdriver was used to lock all screws in.  Copious irrigation omohyoid was repaired with Vicryl sutures.  3-0 Vicryl in the platysma 4-0 Vicryl subcuticular closure.  Tincture benzoin Steri-Strips postop dressing and soft collar was applied patient taught procedure well transferred recovery room in stable condition.  In the care room she noted unchanged tingling and numbness in her hands. ?

## 2021-10-20 NOTE — Evaluation (Signed)
Occupational Therapy Evaluation Patient Details Name: Claudia [name] MRN: [id number] DOB: 11/18/1933 Today's Date: 10/20/2021   History of Present Illness 88 yo female s/p 10/19/21 C3-4 C4-5 C5-6 ACDF with soft collar PMH bracial neuritis, abnormal gait, Degeneration of lumbar, HTN, gouty arthropathy, OA, lumbar laminectomy 2011, R TKA   Clinical Impression   Patient is s/p C3-4 C4-5 C5-6 ACDF surgery resulting in functional limitations due to the deficits listed below (see OT problem list). Pt's spouse currently hospitalized at [facility] as well with pending d/c with comfort care per patient. Pt reports ability to d/c to sister home in [place] for 1 week recovery and all assistance at her home has been from a neighbor. 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 Pt with decreased fine motor and sensation in hands with recommendation for follow up OT for hand therapy. Pt educated on cervical precautions. Ot to attempt second session to help reinforce adls with cervical precautions. Pt requires (A) with bil LE to complete bed mobility supine to sit and sit to supine at this time. Pt reports sisters are more mobile with no DME needs. Patient will benefit from skilled OT acutely to increase independence and safety with ADLS to allow discharge HHOT.       Recommendations for follow up therapy are one component of a multi-disciplinary discharge planning process, led by the attending physician.  Recommendations may be updated based on patient status, additional functional criteria and insurance authorization.   Follow Up Recommendations  Home health OT    Assistance Recommended at Discharge Set up Supervision/Assistance  Patient can return home with the following A little help with walking and/or transfers;A little help with bathing/dressing/bathroom;Assistance with cooking/housework;Direct supervision/assist for medications management;Assist for  transportation;Help with stairs or ramp for entrance    Functional Status Assessment  Patient has had a recent decline in their functional status and demonstrates the ability to make significant improvements in function in a reasonable and predictable amount of time.  Equipment Recommendations  BSC/3in1 (standard commodes at sister home pt verbalized need for [facility])    Recommendations for Other Services PT consult     Precautions / Restrictions Precautions Precautions: Fall Precaution Comments: cervical handout provided, soft collar present for wear and showering, pt reports fall x2 with last 3 weeks Required Braces or Orthoses: Cervical Brace Cervical Brace: Soft collar      Mobility Bed Mobility Overal bed mobility: Needs Assistance Bed Mobility: Sit to Supine       Sit to supine: Max assist   General bed mobility comments: pt requires mod cues to sequence UB into side lying and max (A) to lift legs onto the bed surface. pt is unable to slid legs on bed surface and requries repositioning    Transfers Overall transfer level: Needs assistance Equipment used: Rolling walker (2 wheels) Transfers: Sit to/from Stand Sit to Stand: Min assist           General transfer comment: pt pulling on RW. pt requires (A) To power up from bed surface and with urgency due to need to void bladder. pt with pull up at this time to help with any incontinence or urgency      Balance Overall balance assessment: Mild deficits observed, not formally tested, History of Falls  ADL either performed or assessed with clinical judgement   ADL Overall ADL's : Needs assistance/impaired Eating/Feeding: Minimal assistance;Bed level   Grooming: Wash/dry hands;Minimal assistance;Standing Grooming Details (indicate cue type and reason): increased time and difficulty turning on water Upper Body Bathing: Minimal assistance;Sitting   Lower Body  Bathing: Moderate assistance;Sit to/from stand   Upper Body Dressing : Minimal assistance;Sitting   Lower Body Dressing: Moderate assistance;Sit to/from stand   Toilet Transfer: Minimal assistance;Ambulation;Regular Toilet;Rolling walker (2 wheels);Grab bars   Toileting- Clothing Manipulation and Hygiene: Minimal assistance;Sit to/from stand       Functional mobility during ADLs: Minimal assistance;Rolling walker (2 wheels) General ADL Comments: pt requires (A) To power off toilet as pt pulling up on bars     Vision Baseline Vision/History: 1 Wears glasses Patient Visual Report: No change from baseline       Perception     Praxis      Pertinent Vitals/Pain Pain Assessment Pain Assessment: 0-10 Pain Score: 3  Pain Location: back Pain Descriptors / Indicators: Grimacing, Discomfort Pain Intervention(s): Monitored during session, Repositioned     Hand Dominance Right   Extremity/Trunk Assessment Upper Extremity Assessment Upper Extremity Assessment: RUE deficits/detail;LUE deficits/detail RUE Deficits / Details: unable to push the bed control button hard enough to elevate the bed, pt with inability to remove diaper, pt was able with increased time and effort to turn on the water, able to shoulder flexion to 90 degrees RUE Sensation: decreased light touch RUE Coordination: decreased fine motor;decreased gross motor LUE Deficits / Details: decreased ability to push control buttons LUE Sensation: decreased light touch LUE Coordination: decreased fine motor;decreased gross motor   Lower Extremity Assessment Lower Extremity Assessment: Defer to PT evaluation (abnormal gait, shuffling feet)   Cervical / Trunk Assessment Cervical / Trunk Assessment: Neck Surgery   Communication Communication Communication: No difficulties   Cognition Arousal/Alertness: Awake/alert Behavior During Therapy: WFL for tasks assessed/performed Overall Cognitive Status: Within Functional  Limits for tasks assessed                                       General Comments  pt has soft collar and a second one for bathing. pt reports fatigue and requiring a nap at this time. Ot to attempt second session prior to d/c due to further education needed. recommending home health surfaces due to decreased mobility, fall risk and returning home alone in 1 week per patient    Exercises Exercises: Other exercises Other Exercises Other Exercises: pt with decrease fine motor and could benefit from follow up for hand exercises.   Shoulder Instructions      Home Living Family/patient expects to be discharged to:: Private residence Living Arrangements: Other (Comment) (sisters x2 going to sister house in [place] x1 week) Available Help at Discharge: Family;Available PRN/intermittently Type of Home: House Home Access: Stairs to enter [employer] of Steps: 5   Home Layout: One level     Bathroom Shower/Tub: Chief Strategy Officer: Standard     Home Equipment: Agricultural consultant (2 wheels) (adjustable bed at home but not at sisters house, elevated toilet at home but standard at sisters house)   Additional Comments: d/c reflects sisters home setup not patients. pt lives in house with spouse but spouse is currently hospitalized pending placement      Prior Functioning/Environment Prior Level of Function : Independent/Modified Independent;History of Falls (last six months)  Mobility Comments: pt reports x2 falls last being in 3 weeks with fire department required to get her up          OT Problem List: Decreased strength;Decreased activity tolerance;Impaired balance (sitting and/or standing);Decreased coordination;Decreased safety awareness;Decreased knowledge of use of DME or AE;Decreased knowledge of precautions;Obesity;Impaired UE functional use;Pain      OT Treatment/Interventions: Self-care/ADL training;Therapeutic  exercise;Neuromuscular education;Energy conservation;DME and/or AE instruction;Manual therapy;Modalities;Therapeutic activities;Patient/family education;Balance training    OT Goals(Current goals can be found in the care plan section) Acute Rehab OT Goals Patient Stated Goal: to get better OT Goal Formulation: With patient Time For Goal Achievement: 11/03/21 Potential to Achieve Goals: Good  OT Frequency: Min 2X/week    Co-evaluation              AM-PAC OT "6 Clicks" Daily Activity     Outcome Measure Help from another person eating meals?: A Little Help from another person taking care of personal grooming?: A Little Help from another person toileting, which includes using toliet, bedpan, or urinal?: A Little Help from another person bathing (including washing, rinsing, drying)?: A Lot Help from another person to put on and taking off regular upper body clothing?: A Little Help from another person to put on and taking off regular lower body clothing?: A Lot 6 Click Score: 16   End of Session Equipment Utilized During Treatment: Gait belt;Cervical collar;Rolling walker (2 wheels) Nurse Communication: Mobility status;Precautions  Activity Tolerance: Patient tolerated treatment well Patient left: in bed;with call bell/phone within reach  OT Visit Diagnosis: Unsteadiness on feet (R26.81);Muscle weakness (generalized) (M62.81)                Time: 1308-6578 OT Time Calculation (min): 20 min Charges:  OT General Charges $OT Visit: 1 Visit OT Evaluation $OT Eval Moderate Complexity: 1 Mod   Brynn, OTR/L  Acute Rehabilitation Services Office: [phone] .   Mateo Flow 10/20/2021, 10:01 AM

## 2021-10-20 NOTE — Progress Notes (Signed)
Occupational Therapy Treatment ?Patient Details ?Name: Claudia [name] ?MRN: [id number] ?DOB: December 22, 1933 ?Today's Date: 10/20/2021 ? ? ?History of present illness 88 yo female s/p 10/19/21 C3-4 C4-5 C5-6 ACDF with soft collar PMH brachial neuritis, abnormal gait, Degeneration of lumbar, HTN, gouty arthropathy, OA, lumbar laminectomy 2011, R TKA ?  ?OT comments ? Pt requesting that [facility] come to her house and pt educated to talk to doctor regarding this request. Pt reports that neighbor Denyse Amass will deliver ramps from her house to her sister home in [place]. Pt advised to have w/c as well to decrease fall risk initially with transfers in and out of home. Pt plans to sponge bath initially with sister (A). Pt cued during session to avoid flexion at hips and neck to reach for feet. Pt advised to use reacher and figure 4 cross on the R LE. Recommendation for HHOT. Pt high fall risk at this time.  ?  ? ?Recommendations for follow up therapy are one component of a multi-disciplinary discharge planning process, led by the attending physician.  Recommendations may be updated based on patient status, additional functional criteria and insurance authorization. ?   ?Follow Up Recommendations ? Home health OT  ?  ?Assistance Recommended at Discharge Set up Supervision/Assistance  ?Patient can return home with the following ? A little help with walking and/or transfers;A little help with bathing/dressing/bathroom;Assistance with cooking/housework;Direct supervision/assist for medications management;Assist for transportation;Help with stairs or ramp for entrance ?  ?Equipment Recommendations ? BSC/3in1  ?  ?Recommendations for Other Services PT consult ? ?  ?Precautions / Restrictions Precautions ?Precautions: Fall ?Precaution Comments: reviewed cervical precautions and hand exercises HEP ?Required Braces or Orthoses: Cervical Brace ?Cervical Brace: Soft collar ?Restrictions ?Weight Bearing Restrictions: No  ? ? ?  ? ?Mobility Bed  Mobility ?Overal bed mobility: Needs Assistance ?Bed Mobility: Supine to Sit ?  ?  ?Supine to sit: Min assist, HOB elevated ?Sit to supine: Max assist ?  ?General bed mobility comments: pt requires (A) to bring LLE out of the bed off surface ?  ? ?Transfers ?Overall transfer level: Needs assistance ?Equipment used: Rolling walker (2 wheels) ?Transfers: Sit to/from Stand ?Sit to Stand: Min guard ?  ?  ?  ?  ?  ?General transfer comment: pt requires bil UE bracing against bed surface. cues not to pull on RW and increased time. pt with best return demo LUE on bed surface pushing ?  ?  ?Balance Overall balance assessment: Needs assistance ?Sitting-balance support: Bilateral upper extremity supported, Feet supported ?Sitting balance-Leahy Scale: Fair ?  ?  ?Standing balance support: Bilateral upper extremity supported, During functional activity, Reliant on assistive device for balance ?  ?  ?  ?  ?  ?  ?  ?  ?  ?High Level Balance Comments: pt asked to pass a narrow space at the foot of the bed with RW ( which pt reports this is like what i have to do in my bathroom). Pt educated to side step. pt proceeds to turn facing one handle of the RW and scoot it behind her. pt stopped and corrected in poor positioning int he RW ?  ?  ?  ?   ? ?ADL either performed or assessed with clinical judgement  ? ?ADL Overall ADL's : Needs assistance/impaired ?Eating/Feeding: Independent ?  ?  ?  ?  ?  ?  ?  ?Upper Body Dressing : Min guard;Sitting ?  ?Lower Body Dressing: Minimal assistance;Sit to/from stand ?Lower Body Dressing Details (  indicate cue type and reason): requires cues to dress L Le first and then R LE due to inability to lift L LE. pt with decreased hip flexion on L. pt states "i dont konw what is going on with this leg but it just wont do" ?  ?  ?  ?  ?  ?  ?Functional mobility during ADLs: Minimal assistance;Rolling walker (2 wheels) ?  ?  ? 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  ?Lower Extremity Assessment ?Lower Extremity Assessment: RLE deficits/detail;LLE deficits/detail ?RLE Deficits / Details: shuffling of BLE with gait, BLE lymphedema. Able to perform SLR with RLE in bed with increased effort and for brief period of time ?LLE Deficits / Details: unable to perform SLR on LLE. Shuffling of BLE with gait, BLE lymphedema. ?  ?Cervical / Trunk Assessment ?Cervical / Trunk Assessment: Neck Surgery ?  ? ?Vision   ?  ?  ?Perception   ?  ?Praxis   ?  ? ?Cognition Arousal/Alertness: Awake/alert ?Behavior During Therapy: Restless ?Overall Cognitive Status: Impaired/Different from baseline ?Area of Impairment: Safety/judgement ?  ?  ?  ?  ?  ?  ?  ?  ?  ?  ?  ?  ?Safety/Judgement: Decreased awareness of deficits, Decreased awareness of safety ?  ?  ?General Comments: pt requires cues throughout session for cervical precautions. pt reports not feeling she needs her wheelchair after finishing the stairs with PT that were not very successful and safety concerns. Pt asking the same question several times ?  ?  ?   ?Exercises Exercises: Other exercises ?Other Exercises ?Other Exercises: educated on fine motor exercises and return demo. instructed to complete the first 7 task on the sheet 3 times per day for no more than 10 minutes each time ? ?  ?Shoulder Instructions   ? ? ?  ?General Comments soft collar will require family (A)> pt educated on use of second soft collar delivered to room to wrap it and use it during showering. pt verbalized planning to sponge bath initially  ? ? ?Pertinent Vitals/ Pain       Pain Assessment ?Pain Assessment: No/denies pain ?Pain Score: 3  ?Pain Location: back ?Pain Descriptors / Indicators: Grimacing,  Discomfort ?Pain Intervention(s): Monitored during session, Repositioned ? ?Home Living Family/patient expects to be discharged to:: Private residence ?Living Arrangements: Other (Comment) (will stay with sister for 1 week post D/C) ?Available Help at Discharge: Family;Available PRN/intermittently ?Type of Home: House ?Home Access: Stairs to enter;Ramped entrance;Other (comment) (portable ramp pt can use) ?Entrance Stairs-Number of Steps: 5 ?  ?Home Layout: One level ?  ?  ?Bathroom Shower/Tub: Tub/shower unit ?  ?Bathroom Toilet: Standard ?  ?  ?Home Equipment: Rolling Walker (2 wheels) (adjustable bed at her house, not at sister's (where she will be D/C'd to). Elevated toilet only at pt's home, not at sister's house.) ?  ?Additional Comments: d/c reflects sisters home setup not patients. pt lives in house with spouse but spouse is currently hospitalized pending placement ?  ? ?  ?Prior Functioning/Environment    ?  ?  ?  ?   ? ?Frequency ? Min 2X/week  ? ? ? ? ?  ?Progress Toward Goals ? ?OT Goals(current goals can now be found in the care plan  section) ? Progress towards OT goals: Progressing toward goals ? ?Acute Rehab OT Goals ?Patient Stated Goal: to go home today ?OT Goal Formulation: With patient ?Time For Goal Achievement: 11/03/21 ?Potential to Achieve Goals: Good ?ADL Goals ?Pt Will Perform Lower Body Dressing: with min guard assist;with adaptive equipment;sit to/from stand ?Pt Will Transfer to Toilet: with supervision;ambulating;bedside commode ?Additional ADL Goal #1: pt will don doff ccollar with family (A) mod I  ?Plan Discharge plan remains appropriate   ? ?Co-evaluation ? ? ?   ?  ?  ?  ?  ? ?  ?AM-PAC OT "6 Clicks" Daily Activity     ?Outcome Measure ? ? Help from another person eating meals?: A Little ?Help from another person taking care of personal grooming?: A Little ?Help from another person toileting, which includes using toliet, bedpan, or urinal?: A Little ?Help from another person bathing  (including washing, rinsing, drying)?: A Lot ?Help from another person to put on and taking off regular upper body clothing?: A Little ?Help from another person to put on and taking off regular lower body clothing?: A Lo

## 2021-10-20 NOTE — Progress Notes (Signed)
Physical Therapy Evaluation ?Patient Details ?Name: Claudia [name] ?MRN: [id number] ?DOB: 1934-02-20 ?Today's Date: 10/20/2021 ? ?History of Present Illness ? 88 yo female s/p 10/19/21 C3-4 C4-5 C5-6 ACDF with soft collar PMH brachial neuritis, abnormal gait, Degeneration of lumbar, HTN, gouty arthropathy, OA, lumbar laminectomy 2011, R TKA ?  ?Clinical Impression ? Pt presents s/p 10/19/21 C3-4,C4-5,C5-6 ACDF. Pt impairments include decreased safety awareness, short term recall of precautions, knowledge of use of DME, strength, balance, range of motion, and activity tolerance. These impairments are limiting her ability to safely and independently transfer, ambulate, and navigate stairs. Pt was mod A with bed mobility and transfers, min A with ambulation, and min Ax2 for attempt at stair navigation. Pt using RW for transfers and ambulation for support. Pt had significant difficulty maintaining precautions, requiring constant cuing during mobility. Pt educated on needing her ramp and/or wheelchair to get into/out of her sister's home upon D/C.Pt also educated on lumbar support in her recliner at home. Pt to benefit from acute PT to address deficits and further educate pt on safety perimeters with mobility. PT to progress mobility as tolerated, and will continue to follow acutely.  ?  ?   ?   ? ?Recommendations for follow up therapy are one component of a multi-disciplinary discharge planning process, led by the attending physician.  Recommendations may be updated based on patient status, additional functional criteria and insurance authorization. ? ?Follow Up Recommendations Outpatient PT (When appropriate per post-op protocol. MD to order.) ? ?  ?Assistance Recommended at Discharge Frequent or constant Supervision/Assistance  ?Patient can return home with the following ? A little help with walking and/or transfers;A lot of help with bathing/dressing/bathroom;Assistance with cooking/housework;Direct supervision/assist  for medications management;Direct supervision/assist for financial management;Assist for transportation;Help with stairs or ramp for entrance ? ?  ?Equipment Recommendations BSC/3in1  ?Recommendations for Other Services ?    ?  ?Functional Status Assessment Patient has had a recent decline in their functional status and demonstrates the ability to make significant improvements in function in a reasonable and predictable amount of time.  ? ?  ?Precautions / Restrictions Precautions ?Precautions: Fall ?Precaution Comments: reviewed cervical precautions and hand exercises HEP ?Required Braces or Orthoses: Cervical Brace ?Cervical Brace: Soft collar ?Restrictions ?Weight Bearing Restrictions: No  ? ?  ? ?Mobility ? Bed Mobility ?Overal bed mobility: Needs Assistance ?Bed Mobility: Sidelying to Sit, Rolling, Sit to Sidelying ?Rolling: Mod assist ?Sidelying to sit: Min assist ?  ?  ?Sit to sidelying: Max assist ?General bed mobility comments: pt requires mod A for rolling, for LE management to EOB, and min A for trunk elevation with sidelying to sit. Max A for LE management from sit to sidelying ?  ? ?Transfers ?Overall transfer level: Needs assistance ?Equipment used: Rolling walker (2 wheels) ?Transfers: Sit to/from Stand ?Sit to Stand: Mod assist ?  ?  ?  ?  ?  ?General transfer comment: pt required cues for hand placement before transfer repeatedly throughout session. pt required mod A to power up to standing with cues to maintain precautions with limited carryover. ?  ? ?Ambulation/Gait ?Ambulation/Gait assistance: Min assist ?Gait Distance (Feet): 60 Feet ?Assistive device: Rolling walker (2 wheels) ?Gait Pattern/deviations: Step-to pattern, Decreased stride length, Decreased stance time - left, Decreased dorsiflexion - right, Decreased dorsiflexion - left, Trunk flexed, Narrow base of support, Knee flexed in stance - right, Knee flexed in stance - left, Decreased weight shift to left ?Gait velocity: decreased ?Gait  velocity interpretation: <1.31 ft/sec,  indicative of household ambulator ?  ?General Gait Details: pt with significant trunk flexion even with max cues and reminders of precautions. Pt had significant lateral sway during ambulation using RW. Pt with shuffling gait pattern and cued for heel strike at beginning of stance phase ? ?Stairs ?Stairs: Yes ?Stairs assistance: Max assist, +2 physical assistance, +2 safety/equipment ?Stair Management: One rail Right, Sideways ?Number of Stairs: 1 ?  ? ?Wheelchair Mobility ?  ? ?Modified Rankin (Stroke Patients Only) ?  ? ?  ? ?Balance Overall balance assessment: Needs assistance ?Sitting-balance support: No upper extremity supported, Single extremity supported ?Sitting balance-Leahy Scale: Fair ?Sitting balance - Comments: pt able to sit unsupported for brief periods of time, however prefers UE support. ?  ?Standing balance support: Bilateral upper extremity supported, During functional activity, Reliant on assistive device for balance ?  ?  ?  ?  ?  ?  ?  ?  ?  ?  ?  ?  ?  ?   ? ? ? ?Pertinent Vitals/Pain Pain Assessment ?Pain Assessment: Faces ?Faces Pain Scale: No hurt  ? ? ?Home Living Family/patient expects to be discharged to:: Private residence ?Living Arrangements: Other (Comment) (will stay with sister for 1 week post D/C) ?Available Help at Discharge: Family;Available PRN/intermittently ?Type of Home: House ?Home Access: Stairs to enter;Ramped entrance;Other (comment) (portable ramp pt can use) ?  ?Entrance Stairs-Number of Steps: 5 ?  ?Home Layout: One level ?Home Equipment: Rolling Walker (2 wheels) (adjustable bed at her house, not at sister's (where she will be D/C'd to). Elevated toilet only at pt's home, not at sister's house.) ?Additional Comments: d/c reflects sisters home setup not patients. pt lives in house with spouse but spouse is currently hospitalized pending placement  ?  ?Prior Function Prior Level of Function : Independent/Modified  Independent;History of Falls (last six months) ?  ?  ?  ?  ?  ?  ?Mobility Comments: uses RW at baseline. pt reports x2 falls last being in 3 weeks with fire department required to get her up ?  ?  ? ? ?Hand Dominance  ?   ? ?  ?Extremity/Trunk Assessment  ? Upper Extremity Assessment ?Upper Extremity Assessment: RUE deficits/detail;LUE deficits/detail ?RUE Deficits / Details: reports tingling and pricky sensation ?LUE Deficits / Details: noted to have swan neck deformity, bonutonniere deformity reports tingling pricky skin feeling similar to prior to surgery ?  ? ?Lower Extremity Assessment ?Lower Extremity Assessment: RLE deficits/detail;LLE deficits/detail ?RLE Deficits / Details: shuffling of BLE with gait, BLE lymphedema. Able to perform SLR with RLE in bed with increased effort and for brief period of time ?LLE Deficits / Details: unable to perform SLR on LLE. Shuffling of BLE with gait, BLE lymphedema. ?  ? ?Cervical / Trunk Assessment ?Cervical / Trunk Assessment: Neck Surgery  ?Communication  ? Communication: No difficulties  ?Cognition Arousal/Alertness: Awake/alert ?Behavior During Therapy: Anxious ?Overall Cognitive Status: No family/caregiver present to determine baseline cognitive functioning ?  ?  ?  ?  ?  ?  ?  ?  ?  ?  ?  ?  ?  ?  ?  ?  ?General Comments: pt reported inconsistencies with home living informatin reported throughout session. Kept asking pt how many steps her sister had to get into house. Pt reporting different number and different railing set up throughout session. ?  ?  ? ?  ?General Comments General comments (skin integrity, edema, etc.): soft collar will require family (A)> pt educated on  use of second soft collar delivered to room to wrap it and use it during showering. pt verbalized planning to sponge bath initially ? ?  ?Exercises    ? ?Assessment/Plan  ?  ?PT Assessment Patient needs continued PT services  ?PT Problem List   ? ?   ?  ?PT Treatment Interventions     ? ?PT Goals  (Current goals can be found in the Care Plan section)  ?Acute Rehab PT Goals ?Patient Stated Goal: Eventually go back to her home (she is hoping about a week). ?PT Goal Formulation: With patient ?Time For Goal Achiev

## 2021-10-20 NOTE — Telephone Encounter (Signed)
Based off of labs from today, patient should be on Xarelto '15mg'$ , however, pt just had surgery yesterday. Will make sure scr normalizes before sending in Rx to [facility] ?

## 2021-10-23 DIAGNOSIS — M47817 Spondylosis without myelopathy or radiculopathy, lumbosacral region: Secondary | ICD-10-CM | POA: Diagnosis not present

## 2021-10-27 ENCOUNTER — Ambulatory Visit: Payer: PPO

## 2021-10-27 ENCOUNTER — Ambulatory Visit (INDEPENDENT_AMBULATORY_CARE_PROVIDER_SITE_OTHER): Payer: PPO | Admitting: Orthopaedic Surgery

## 2021-10-27 ENCOUNTER — Encounter: Payer: Self-pay | Admitting: Orthopaedic Surgery

## 2021-10-27 VITALS — BP 143/88 | HR 76 | Ht 70.0 in | Wt 177.0 lb

## 2021-10-27 DIAGNOSIS — Z981 Arthrodesis status: Secondary | ICD-10-CM

## 2021-10-27 NOTE — Progress Notes (Signed)
? ?Post-Op Visit Note ?  ?Patient: Claudia [name]           ?Date of Birth: 1933/10/09           ?MRN: [id number] ?Visit Date: 10/27/2021 ?PCP: Janith Lima, MD ? ? ?Assessment & Plan: Post 3 level cervical fusion with abnormal T2 signal of the cord.  States she is not dragging her left foot is much still having used a walker.  Hands are moving better but she still has tingling and numbness in both upper extremities.  Throat is sore but better than last week.  Incision looks good Steri-Strips changed continue collar.  Extra collar covers given.  Long discussion with patient that she had been on a walker for over a year with severe cord compression and that she should continue to get some improvement but ultimately were unsure of what her maximal level recovery will be.  Return 5 weeks for lateral flexion-extension x-ray out of collar. ? ?Chief Complaint:  ?Chief Complaint  ?Patient presents with  ? Neck - Routine Post Op  ?  10/19/2021 C3-4, C4-5, C5-6 ACDF  ? ?Visit Diagnoses:  ?1. Status post cervical spinal fusion   ? ? ?Plan: Return 5 weeks x-rays as above. ? ?Follow-Up Instructions: Return in about 5 weeks (around 12/01/2021).  ? ?Orders:  ?Orders Placed This Encounter  ?Procedures  ? XR Cervical Spine 2 or 3 views  ? ?No orders of the defined types were placed in this encounter. ? ? ?Imaging: ?No results found. ? ?PMFS History: ?Patient Active Problem List  ? Diagnosis Date Noted  ? Cervical stricture or stenosis 10/20/2021  ? Cervical spinal stenosis 10/19/2021  ? Meningioma ([place]) 08/24/2021  ? PAF (paroxysmal atrial fibrillation) ([place]) 11/03/2020  ? Stage 3b chronic kidney disease ([place]) 05/06/2020  ? Chronic left-sided low back pain without sciatica 12/25/2018  ? Spinal stenosis in cervical region 05/12/2016  ? Lower extremity edema 03/04/2016  ? Therapeutic opioid-induced constipation ([facility]IC) 01/01/2016  ? Chronic idiopathic constipation 09/11/2013  ? Lumbosacral spondylosis without myelopathy 04/09/2013   ? Postlaminectomy syndrome, lumbar region 04/09/2013  ? Anticoagulation management encounter 02/01/2012  ? Neuropathy, peripheral 08/04/2011  ? Pure hypercholesterolemia 08/04/2011  ? Essential hypertension, benign 08/04/2011  ? Hypothyroidism 08/04/2011  ? DJD (degenerative joint disease) of knee 08/04/2011  ? Gout 08/04/2011  ? ?Past Medical History:  ?Diagnosis Date  ? Abnormality of gait   ? Brachial neuritis or radiculitis NOS   ? Degeneration of lumbar or lumbosacral intervertebral disc   ? Dysrhythmia   ? Essential hypertension, benign   ? Gouty arthropathy   ? Lumbago   ? Obesity   ? Osteoarthritis   ? Pure hypercholesterolemia   ? Unspecified hypothyroidism   ?  ?Family History  ?Problem Relation Age of Onset  ? Heart disease Father   ? Heart attack Father   ? Hypertension Mother   ? Alzheimer's disease Mother   ? Diabetes Sister   ? Cirrhosis Brother   ? Diabetes Sister   ? Diabetes Sister   ? Diabetes Brother   ? Heart attack Brother   ? Cancer Neg Hx   ? Kidney disease Neg Hx   ?  ?Past Surgical History:  ?Procedure Laterality Date  ? ABDOMINAL HYSTERECTOMY  03/13/2010  ? ANTERIOR CERVICAL DECOMP/DISCECTOMY FUSION N/A 10/19/2021  ? Procedure: C3-4, C4-5, C5-6 ANTERIOR CERVICAL DISCECTOMY FUSION, ALLOGRAFT, PLATE;  Surgeon: [name], MD;  Location: [facility];  Service: Orthopedics;  Laterality: N/A;  ?  BACK SURGERY    ? LUMBAR LAMINECTOMY  03/13/2010  ? right knee replacement  03/13/2010  ? ?Social History  ? ?Occupational History  ? Not on file  ?Tobacco Use  ? Smoking status: Never  ? Smokeless tobacco: Never  ?Vaping Use  ? Vaping Use: Never used  ?Substance and Sexual Activity  ? Alcohol use: Yes  ?  Comment: 1 glass wine/month  ? Drug use: No  ? Sexual activity: Never  ? ? ? ?

## 2021-10-27 NOTE — Discharge Summary (Signed)
? ?Patient ID: ?Claudia [name] ?MRN: [id number] ?DOB/AGE: 86/18/1935 86 y.o. ? ?Admit date: 10/19/2021 ?Discharge date: 10/20/2021 ? ?Admission Diagnoses:  ?Principal Problem: ?  Cervical spinal stenosis ?Active Problems: ?  Cervical stricture or stenosis ? ? ?Discharge Diagnoses:  ?Principal Problem: ?  Cervical spinal stenosis ?Active Problems: ?  Cervical stricture or stenosis ? status post Procedure(s): ?C3-4, C4-5, C5-6 ANTERIOR CERVICAL DISCECTOMY FUSION, ALLOGRAFT, PLATE ? ?Past Medical History:  ?Diagnosis Date  ? Abnormality of gait   ? Brachial neuritis or radiculitis NOS   ? Degeneration of lumbar or lumbosacral intervertebral disc   ? Dysrhythmia   ? Essential hypertension, benign   ? Gouty arthropathy   ? Lumbago   ? Obesity   ? Osteoarthritis   ? Pure hypercholesterolemia   ? Unspecified hypothyroidism   ? ? ?Surgeries: Procedure(s): ?C3-4, C4-5, C5-6 ANTERIOR CERVICAL DISCECTOMY FUSION, ALLOGRAFT, PLATE on 09/08/9700 ?  ?Consultants:  ? ?Discharged Condition: Improved ? ?Hospital Course: Claudia [name] is an 88 y.o. female who was admitted 10/19/2021 for operative treatment of Cervical spinal stenosis. Patient failed conservative treatments (please see the history and physical for the specifics) and had severe unremitting pain that affects sleep, daily activities and work/hobbies. After pre-op clearance, the patient was taken to the operating room on 10/19/2021 and underwent  Procedure(s): ?C3-4, C4-5, C5-6 ANTERIOR CERVICAL DISCECTOMY FUSION, ALLOGRAFT, PLATE.   ? ?Patient was given perioperative antibiotics:  ?Anti-infectives (From admission, onward)  ? ? Start     Dose/Rate Route Frequency Ordered Stop  ? 10/19/21 1130  fluconazole (DIFLUCAN) tablet 150 mg       ? 150 mg Oral  Once 10/19/21 1117 10/19/21 1405  ? 10/19/21 0600  ceFAZolin (ANCEF) IVPB 2g/100 mL premix       ? 2 g ?200 mL/hr over 30 Minutes Intravenous On call to O.R. 10/19/21 0550 10/19/21 0808  ? 10/19/21 0555  ceFAZolin (ANCEF)  2-4 GM/100ML-% IVPB  Status:  Discontinued       ?Note to Pharmacy: Odelia Gage E: cabinet override  ?    10/19/21 0555 10/19/21 0829  ? ?  ?  ? ?Patient was given sequential compression devices and early ambulation to prevent DVT.  ? ?Patient benefited maximally from hospital stay and there were no complications. At the time of discharge, the patient was urinating/moving their bowels without difficulty, tolerating a regular diet, pain is controlled with oral pain medications and they have been cleared by PT/OT.  ? ?Recent vital signs: No data found.  ? ?Recent laboratory studies: No results for input(s): WBC, HGB, HCT, PLT, NA, K, CL, CO2, BUN, CREATININE, GLUCOSE, INR, CALCIUM in the last 72 hours. ? ?Invalid input(s): PT, 2 ? ? ?Discharge Medications:   ?Allergies as of 10/20/2021   ? ?   Reactions  ? Lipitor [atorvastatin] Other (See Comments)  ? Muscle aches  ? Trileptal [oxcarbazepine] Other (See Comments)  ? confusion  ? ?  ? ?  ?Medication List  ?  ? ?TAKE these medications   ? ?diltiazem 30 MG tablet ?Commonly known as: CARDIZEM ?TAKE ONE TABLET EVERY 4 HOURS AS NEEDED FOR afib heart rate greater THAN 100 aslong as blood pressure less THAN 100 ?  ?HYDROcodone-acetaminophen 5-325 MG tablet ?Commonly known as: NORCO/VICODIN ?TAKE ONE TABLET BY MOUTH EVERY 8 HOURS BEFORE meals ?  ?linaclotide 290 MCG Caps capsule ?Commonly known as: Linzess ?Take 1 capsule (290 mcg total) by mouth daily before breakfast. ?  ?rivaroxaban 20 MG Tabs tablet ?Commonly  known as: XARELTO ?Take 1 tablet (20 mg total) by mouth daily with supper. ?  ?torsemide 20 MG tablet ?Commonly known as: DEMADEX ?Take 1 tablet (20 mg total) by mouth daily. ?  ?vitamin C 500 MG tablet ?Commonly known as: ASCORBIC ACID ?Take 500 mg by mouth daily. ?  ?vitamin E 200 UNIT capsule ?Take 200 Units by mouth daily. ?  ? ?  ? ? ?Diagnostic Studies: DG Cervical Spine 2 or 3 views ? ?Result Date: 10/19/2021 ?CLINICAL DATA:  ACDF EXAM: CERVICAL SPINE - 2-3  VIEW COMPARISON:  Radiograph 07/28/2021 FINDINGS: Intraoperative images during C3-C4, C4-C5, and C5-C6 ACDF. Intact hardware without evidence of immediate complication. IMPRESSION: Intraoperative images during C3-C6 ACDF. No evidence of immediate complication. Electronically Signed   By: Maurine Simmering M.D.   On: 10/19/2021 11:27  ? ?DG C-Arm 1-60 Min-No Report ? ?Result Date: 10/19/2021 ?Fluoroscopy was utilized by the requesting physician.  No radiographic interpretation.  ? ?DG C-Arm 1-60 Min-No Report ? ?Result Date: 10/19/2021 ?Fluoroscopy was utilized by the requesting physician.  No radiographic interpretation.  ? ?DG C-Arm 1-60 Min-No Report ? ?Result Date: 10/19/2021 ?Fluoroscopy was utilized by the requesting physician.  No radiographic interpretation.   ? ?Discharge Instructions   ? ? Incentive spirometry RT   Complete by: As directed ?  ? ?  ? ? ? Follow-up Information   ? ? [name], MD. Schedule an appointment as soon as possible for a visit today.   ?Specialty: Orthopedic Surgery ?Why: RETURN OFFICE WITH DR YATES ONE WEEK POSTOP.  CALL TO SCHEDULE APPOINTMENT ?Contact information: ?[address] ?[name] [address] ?[phone] ? ? ?  ?  ? ?  ?  ? ?  ? ? ?Discharge Plan:  discharge to home ? ?Disposition:  ? ? ? ?Signed: ?Benjiman Core  ?10/27/2021, 1:56 PM ? ? ? ?  ?

## 2021-10-28 ENCOUNTER — Telehealth: Payer: Self-pay | Admitting: Radiology

## 2021-10-28 NOTE — Telephone Encounter (Signed)
I called patient in regards to message about labwork. Unfortunately, Dr. Lorin Mercy did not see the message until she had left our office.  Nurse appointment made for Thursday, Oct 29, 2021 to have BMET drawn due to elevated creatinine and chronic Xarelto.  Patient will be here between 3:30 and 4:00pm.  She may leave after labs. ? ? ? ?Dr. Lorin Mercy,  ? ?It looks like you are seeing this patient today at 2 for post op follow up. Looks like she had a bump in her scr post op. She is due for a refill of Xarelto and based off of her labs from the hospital, she would need a lower dose. I was wondering if you wouldn't mind ordering a BMP for her today since she will be at your office so we can recheck her scr and I can rx her the appropriate dose. My hope is that her scr goes back to baseline.  ? ?Thanks  ?Melissa  ? ?Ramond Dial, Pharm.D, BCPS, CPP  ?[id number] [place], [place] [address]  ?Phone: [phone]; Fax: [phone]   ?

## 2021-10-29 ENCOUNTER — Ambulatory Visit (INDEPENDENT_AMBULATORY_CARE_PROVIDER_SITE_OTHER): Payer: PPO | Admitting: Orthopaedic Surgery

## 2021-10-29 ENCOUNTER — Other Ambulatory Visit: Payer: Self-pay

## 2021-10-29 DIAGNOSIS — Z981 Arthrodesis status: Secondary | ICD-10-CM

## 2021-10-29 DIAGNOSIS — R7989 Other specified abnormal findings of blood chemistry: Secondary | ICD-10-CM

## 2021-10-30 LAB — BASIC METABOLIC PANEL
BUN: 21 mg/dL (ref 7–25)
CO2: 28 mmol/L (ref 20–32)
Calcium: 10.5 mg/dL — ABNORMAL HIGH (ref 8.6–10.4)
Chloride: 104 mmol/L (ref 98–110)
Creat: 0.93 mg/dL (ref 0.60–0.95)
Glucose, Bld: 97 mg/dL (ref 65–99)
Potassium: 3.8 mmol/L (ref 3.5–5.3)
Sodium: 142 mmol/L (ref 135–146)

## 2021-10-30 MED ORDER — RIVAROXABAN 20 MG PO TABS: 20.0000 mg | ORAL_TABLET | Freq: Every day | ORAL | 1 refills | Status: DC

## 2021-10-30 NOTE — Telephone Encounter (Signed)
Actual BW Crcl is back to >50. Will send in rx for Xarelto '20mg'$ . ?

## 2021-10-31 DIAGNOSIS — R269 Unspecified abnormalities of gait and mobility: Secondary | ICD-10-CM | POA: Diagnosis not present

## 2021-11-02 ENCOUNTER — Ambulatory Visit: Payer: PPO | Admitting: Internal Medicine

## 2021-11-02 ENCOUNTER — Telehealth: Payer: Self-pay | Admitting: Orthopaedic Surgery

## 2021-11-02 NOTE — Telephone Encounter (Signed)
noted 

## 2021-11-02 NOTE — Telephone Encounter (Signed)
I called and spoke with Claudia [name] at [facility].  She is putting in email and advising that patient's wheelchair needs repair or replacement. She does not need a walker. [employer] should get this scheduled and call and set up with patient. I called patient and advised. ?

## 2021-11-02 NOTE — Telephone Encounter (Signed)
Pt called requesting a call back concerning her blood work from last week visit. Please call pt with results at [phone] ?

## 2021-11-02 NOTE — Telephone Encounter (Signed)
I spoke with patient.  She states that the tire came off on her wheelchair on Saturday. She called [employer] to have it fixed or replacement (they have already had to fix one of the legs) and was told they put a note in. She received a call stating they are bringing her a walker. Patient is unable to use walker.  Call for [employer] [phone]. ?

## 2021-11-02 NOTE — Telephone Encounter (Signed)
Please advise 

## 2021-11-02 NOTE — Telephone Encounter (Signed)
Claudia [name] Please call this pt about her replacement [facility]. She states [facility] sent her a walker and she cant walk with a walker. Pt states she need to go see her husband in hospice. Please call pt at [phone]. ?

## 2021-11-10 DIAGNOSIS — Z981 Arthrodesis status: Secondary | ICD-10-CM | POA: Insufficient documentation

## 2021-11-10 NOTE — Progress Notes (Signed)
Pt seen 2 days ago. See 10/27/21 note ?

## 2021-11-11 ENCOUNTER — Telehealth: Payer: Self-pay | Admitting: Orthopaedic Surgery

## 2021-11-11 NOTE — Telephone Encounter (Signed)
Can you help me with this? What would I need to order for [facility]? Just a HH Aide? ?

## 2021-11-11 NOTE — Telephone Encounter (Signed)
Claudia [name] is calling to request a order to for [place] and custodial care. She was having her sister help but she has injured her back. Ms. Brents feels she only needs someone to come out to her home 2-3x a week for a bout 2-3 weeks. Please advise ?

## 2021-11-11 NOTE — Telephone Encounter (Signed)
Please advise 

## 2021-11-12 NOTE — Telephone Encounter (Signed)
I spoke with patient and advised. She will call [facility] tomorrow and find out exactly what her benefits are. She still believes that she needs an order. I asked her to call me back and let me know if she needs me to do anything further for her.

## 2021-11-13 NOTE — Telephone Encounter (Signed)
I called [employer]. They will email me a form to complete.

## 2021-11-13 NOTE — Telephone Encounter (Signed)
[phone]-WIOXB number for [employer] for custodial care. Per patient, please send referral to them as her insurance will cover it.

## 2021-11-16 NOTE — Telephone Encounter (Signed)
Received form. Completed and faxed back to [phone] for [place].

## 2021-11-17 ENCOUNTER — Telehealth: Payer: Self-pay | Admitting: Orthopaedic Surgery

## 2021-11-17 ENCOUNTER — Ambulatory Visit: Payer: PPO

## 2021-11-17 DIAGNOSIS — E7800 Pure hypercholesterolemia, unspecified: Secondary | ICD-10-CM

## 2021-11-17 DIAGNOSIS — I480 Paroxysmal atrial fibrillation: Secondary | ICD-10-CM

## 2021-11-17 DIAGNOSIS — N1832 Chronic kidney disease, stage 3b: Secondary | ICD-10-CM

## 2021-11-17 DIAGNOSIS — I10 Essential (primary) hypertension: Secondary | ICD-10-CM

## 2021-11-17 NOTE — Patient Instructions (Signed)
Visit Information  Following are the goals we discussed today:   Manage My Medicine   Timeframe:  Long-Range Goal Priority:  Medium Start Date:   11/17/2021                     Expected End Date:   11/18/2022                    Follow Up Date 04/2022   - call for medicine refill 2 or 3 days before it runs out - call if I am sick and can't take my medicine - keep a list of all the medicines I take; vitamins and herbals too - learn to read medicine labels    Why is this important?   These steps will help you keep on track with your medicines.  Plan: Telephone follow up appointment with care management team member scheduled for:  6 months The patient has been provided with contact information for the care management team and has been advised to call with any health related questions or concerns.   Tomasa Blase, PharmD Clinical Pharmacist, [name]   Please call the care guide team at [phone] if you need to cancel or reschedule your appointment.   Patient verbalizes understanding of instructions and care plan provided today and agrees to view in [facility]. Active MyChart status and patient understanding of how to access instructions and care plan via MyChart confirmed with patient.

## 2021-11-17 NOTE — Telephone Encounter (Signed)
I called patient. Claudia [name] is coming out to see her.

## 2021-11-17 NOTE — Telephone Encounter (Signed)
Pt would really to speak with betsy about needing help with bathing.   CB [phone]

## 2021-11-17 NOTE — Progress Notes (Signed)
Chronic Care Management Pharmacy Note  11/17/2021 Name:  Claudia [name] MRN:  [id number] DOB:  May 17, 1934  Summary: -Pt endorses compliance with current medications, denies issues with medications  -notes that BP has been averaging 128-130/79-80 HR ~70 BPM - notes that she has used diltiazem within the last week - under different stressors with recent loss of husband and surgery -effective when taken  -feels that she is improving from surgery, but notes that since surgery she has been more constipated - straining to have a BM -Pt stopped rosuvastatin in December - reports that it had made her feel unwell  Recommendations/Changes made from today's visit: -Recommended for patient to trial docusate 50-$RemoveBeforeDEI'100mg'bxARFnwKRLqDfXkW$  daily for relief of constipation - should issues remain, she will reach out to office  -Pt to continue monitoring BP and HR, will reach out should either become uncontrolled -reasonable to consider lower dose of rosuvastatin such as $Remov'5mg'CzyFYD$  daily / $Remov'5mg'krCMAD$  TIW or a different statin such as pravastatin $RemoveBefore'40mg'qFXjGJtZNyfAH$  daily  -F/u in 6 months   Subjective: Claudia [name] is an 88 y.o. year old female who is a primary patient of Janith Lima, MD.  The CCM team was consulted for assistance with disease management and care coordination needs.    Engaged with patient by telephone for follow up visit in response to provider referral for pharmacy case management and/or care coordination services.   Consent to Services:  The patient was given information about Chronic Care Management services, agreed to services, and gave verbal consent prior to initiation of services.  Please see initial visit note for detailed documentation.   Patient Care Team: Janith Lima, MD as PCP - General (Internal Medicine) Skeet Latch, MD as PCP - Cardiology (Cardiology) Phylliss Bob, MD as Consulting Physician (Orthopedic Surgery) Alanda Slim, Neena Rhymes, MD as Consulting Physician (Ophthalmology) Charlton Haws, [facility] as Pharmacist (Pharmacist)   Recent office visits: 07/09/2021 - Dr. Ronnald Ramp - back pain - fentanyl discontinued  - referred to orthopedic surgery  06/25/2021 - Dr. Ronnald Ramp - ER follow up - back pain - edema - torsemide rx'd - MRI ordered f/u in 6 months  06/02/2021 - Dr. Ronnald Ramp - back pain - no changes to medications  - start fentanyl patch 35mcg q 3 days   Recent consult visits: 10/27/2021 - Dr. Lorin Mercy - Orthopedic surgery  - status post cervical spinal fusion  - x-rays ordered - complete in 5 weeks  10/15/2021 - Benjiman Core - PA - Ortho care- preop clearance has been given, confirmed Xarelto is on hold  09/07/2021 - Dr. Oval Linsey - Cardiology - echo ordered - continue torsemide - f/u in 6 months  08/21/2021 - Dr. Lorin Mercy - Orthopedic surgery - procedure discussed - will move forward and request cardiac clearance  08/07/2021 - Dr. Lorin Mercy - Orthopedic Surgery  07/28/2021 - Dr. Lorin Mercy - Orthopedic surgery - neck pain - PT ordered - left trochanteric injection given - MRI ordered - f/u after MRI is complete   Hospital visits: 07/01/2021 - ED visit - bilateral lower extremity edema - no concern for signs of emboli - advised to continue torsemide and follow up with PCP  06/21/2021 - ED visit - generalized weakness  - workup consistent with worsening congestive heart failure  - started on lasix $Remove'20mg'iOJUadd$  daily on discharge   Objective:  Lab Results  Component Value Date   CREATININE 0.93 10/29/2021   BUN 21 10/29/2021   GFR 48.56 (L) 05/05/2021   GFRNONAA 45 (L) 10/20/2021  GFRAA >60 04/04/2017   NA 142 10/29/2021   K 3.8 10/29/2021   CALCIUM 10.5 (H) 10/29/2021   CO2 28 10/29/2021   GLUCOSE 97 10/29/2021    Lab Results  Component Value Date/Time   HGBA1C 5.5 11/03/2020 11:32 AM   HGBA1C 5.8 (H) 08/24/2015 02:05 AM   GFR 48.56 (L) 05/05/2021 11:44 AM   GFR 40.70 (L) 11/04/2020 01:07 PM    Last diabetic Eye exam: No results found for: HMDIABEYEEXA  Last diabetic Foot exam: No results found  for: HMDIABFOOTEX   Lab Results  Component Value Date   CHOL 148 05/05/2021   HDL 54.40 05/05/2021   LDLCALC 77 05/05/2021   LDLDIRECT 158.9 09/05/2012   TRIG 85.0 05/05/2021   CHOLHDL 3 05/05/2021       Latest Ref Rng & Units 10/19/2021    6:30 AM 06/21/2021    1:44 PM 05/05/2021   11:44 AM  Hepatic Function  Total Protein 6.5 - 8.1 g/dL 5.6   6.5   6.7    Albumin 3.5 - 5.0 g/dL 3.3   3.7   4.1    AST 15 - 41 U/L $Remo'14   28   18    'pIQPQ$ ALT 0 - 44 U/L $Remo'11   23   10    'jIrrR$ Alk Phosphatase 38 - 126 U/L 40   51   58    Total Bilirubin 0.3 - 1.2 mg/dL 1.5   1.4   0.9    Bilirubin, Direct 0.0 - 0.3 mg/dL   0.2      Lab Results  Component Value Date/Time   TSH 4.92 05/05/2021 11:44 AM   TSH 2.88 11/04/2020 01:07 PM   FREET4 1.2 03/29/2016 04:41 PM   FREET4 0.90 01/01/2016 12:16 PM       Latest Ref Rng & Units 10/20/2021    5:21 AM 10/19/2021    6:30 AM 07/01/2021    6:37 PM  CBC  WBC 4.0 - 10.5 K/uL 7.9   4.9   4.0    Hemoglobin 12.0 - 15.0 g/dL 10.6   11.4   12.5    Hematocrit 36.0 - 46.0 % 32.2   34.9   39.3    Platelets 150 - 400 K/uL 191   194   243      Lab Results  Component Value Date/Time   VD25OH 23.5 08/10/2011 12:00 AM    Clinical ASCVD: No  The ASCVD Risk score (Arnett DK, et al., 2019) failed to calculate for the following reasons:   The 2019 ASCVD risk score is only valid for ages 31 to 65       06/02/2021    2:14 PM 05/05/2020    1:14 PM 06/13/2018    1:15 PM  Depression screen PHQ 2/9  Decreased Interest 0 0 0  Down, Depressed, Hopeless 0 0 0  PHQ - 2 Score 0 0 0     Social History   Tobacco Use  Smoking Status Never  Smokeless Tobacco Never   BP Readings from Last 3 Encounters:  10/27/21 (!) 143/88  10/20/21 (!) 161/83  10/15/21 (!) 147/71   Pulse Readings from Last 3 Encounters:  10/27/21 76  10/20/21 60  10/13/21 71   Wt Readings from Last 3 Encounters:  10/27/21 177 lb (80.3 kg)  10/19/21 177 lb (80.3 kg)  10/15/21 179 lb (81.2 kg)    BMI Readings from Last 3 Encounters:  10/27/21 25.40 kg/m  10/19/21 25.40 kg/m  10/15/21 25.68 kg/m  Assessment/Interventions: Review of patient past medical history, allergies, medications, health status, including review of consultants reports, laboratory and other test data, was performed as part of comprehensive evaluation and provision of chronic care management services.   SDOH:  (Social Determinants of Health) assessments and interventions performed: Yes  SDOH Screenings   Alcohol Screen: Not on file  Depression (PHQ2-9): Low Risk    PHQ-2 Score: 0  Financial Resource Strain: Not on file  Food Insecurity: Not on file  Housing: Not on file  Physical Activity: Not on file  Social Connections: Not on file  Stress: Not on file  Tobacco Use: Low Risk    Smoking Tobacco Use: Never   Smokeless Tobacco Use: Never   Passive Exposure: Not on file  Transportation Needs: Not on file    [place]  Allergies  Allergen Reactions   Lipitor [Atorvastatin] Other (See Comments)    Muscle aches   Trileptal [Oxcarbazepine] Other (See Comments)    confusion    Medications Reviewed Today     Reviewed by Tomasa Blase, [facility] (Pharmacist) on 11/17/21 at 1539  Med List Status: <None>   Medication Order Taking? Sig Documenting Provider Last Dose Status Informant  diltiazem (CARDIZEM) 30 MG tablet [id number] Yes TAKE ONE TABLET EVERY 4 HOURS AS NEEDED FOR afib heart rate greater THAN 100 aslong as blood pressure less THAN 100 Skeet Latch, MD Taking Active Self  HYDROcodone-acetaminophen (NORCO/VICODIN) 5-325 MG tablet [id number] Yes TAKE ONE TABLET BY MOUTH EVERY 8 HOURS BEFORE meals Janith Lima, MD Taking Active   linaclotide [facility]) 290 MCG CAPS capsule [id number] Yes Take 1 capsule (290 mcg total) by mouth daily before breakfast. Janith Lima, MD Taking Active   rivaroxaban (XARELTO) 20 MG TABS tablet [id number] Yes Take 1 tablet (20 mg total) by mouth daily with  supper. Skeet Latch, MD Taking Active   torsemide [facility]) 20 MG tablet [id number] Yes Take 1 tablet (20 mg total) by mouth daily. Janith Lima, MD Taking Active Self  vitamin C (ASCORBIC ACID) 500 MG tablet [id number] Yes Take 500 mg by mouth daily. [provider] Taking Active Self  vitamin E 200 UNIT capsule [id number] Yes Take 200 Units by mouth daily. [provider] Taking Active Self            Patient Active Problem List   Diagnosis Date Noted   S/P cervical spinal fusion 11/10/2021   Cervical stricture or stenosis 10/20/2021   Cervical spinal stenosis 10/19/2021   Meningioma ([place]) 08/24/2021   PAF (paroxysmal atrial fibrillation) ([place]) 11/03/2020   Stage 3b chronic kidney disease ([place]) 05/06/2020   Chronic left-sided low back pain without sciatica 12/25/2018   Spinal stenosis in cervical region 05/12/2016   Lower extremity edema 03/04/2016   Therapeutic opioid-induced constipation (OIC) 01/01/2016   Chronic idiopathic constipation 09/11/2013   Lumbosacral spondylosis without myelopathy 04/09/2013   Postlaminectomy syndrome, lumbar region 04/09/2013   Anticoagulation management encounter 02/01/2012   Neuropathy, peripheral 08/04/2011   Pure hypercholesterolemia 08/04/2011   Essential hypertension, benign 08/04/2011   Hypothyroidism 08/04/2011   DJD (degenerative joint disease) of knee 08/04/2011   Gout 08/04/2011    Immunization History  Administered Date(s) Administered   DTaP 01/01/2008   Fluad Quad(high Dose 65+) 03/30/2019, 05/05/2021   Influenza Split 04/06/2012   Influenza Whole 05/04/2011   Influenza, High Dose Seasonal PF 04/17/2013, 03/04/2016, 04/19/2017, 04/07/2018   Influenza,inj,Quad PF,6+ Mos 04/11/2014, 04/14/2015   Influenza-Unspecified 04/15/2020   PFIZER(Purple Top)SARS-COV-2 Vaccination 07/30/2019, 08/25/2019,  03/15/2020, 11/19/2020   Pneumococcal Conjugate-13 04/02/2008, 10/08/2013   Pneumococcal Polysaccharide-23  09/02/2015, 11/03/2020   Tdap 01/01/2008, 08/23/2017   Zoster Recombinat (Shingrix) 01/03/2017, 04/02/2017   Zoster, Live 08/03/2006    Conditions to be addressed/monitored:  Hypertension, Hyperlipidemia, Atrial Fibrillation, Chronic Kidney Disease and Osteoarthritis  Care Plan : [place]  Updates made by Tomasa Blase, RPH since 11/17/2021 12:00 AM     Problem: Hypertension, Hyperlipidemia, Atrial Fibrillation, Chronic Kidney Disease and Osteoarthritis   Priority: High     Long-Range Goal: Disease management   Start Date: 09/16/2020  Expected End Date: 11/18/2022  This Visit's Progress: On track  Recent Progress: On track  Priority: High  Note:   Current Barriers:  Unable to independently monitor therapeutic efficacy  Pharmacist Clinical Goal(s):  Patient will achieve adherence to monitoring guidelines and medication adherence to achieve therapeutic efficacy  Interventions: 1:1 collaboration with Janith Lima, MD regarding development and update of comprehensive plan of care as evidenced by provider attestation and co-signature Inter-disciplinary care team collaboration (see longitudinal plan of care) Comprehensive medication review performed; medication list updated in electronic medical record  Hypertension / CKD stage 3a (BP goal <130/80) -Controlled - home BP is at goal - averaging 120-130's/70-80's - HR ~70 BPM BP Readings from Last 3 Encounters:  10/27/21 (!) 143/88  10/20/21 (!) 161/83  10/15/21 (!) 147/71  -Current treatment: Torsemide 20mg  daily  -Medications previously tried: telmisartan/HCTZ, indapamide, furosemide -Current home readings: 126/68 -Current exercise habits: limited due to back pain -Denies hypotensive/hypertensive symptoms -Educated on BP goals and benefits of medications for prevention of heart attack, stroke and kidney damage; -Counseled to monitor BP at home weekly -Recommended to continue current medication and stay  hydrated  Atrial Fibrillation (Goal: prevent stroke and major bleeding) -Controlled  -New onset 04/2020. Low Afib burden per monitor. -CHADSVASC: 4 -Current treatment: Rate control: Diltiazem 30 mg q4h PRN HR > 100 Anticoagulation: Xarelto 20mg  daily - dosing appropriate based on most recent CrCl (55.42mL/min) -Recommended to continue current medication -home BP is at goal - averaging 120-130's/70-80's - HR ~70 BPM  Hyperlipidemia: (LDL goal < 100) -Not ideally controlled-  pt has stopped rosuvastatin - 05/2022 - reports that it made her feel unwell  Lab Results  Component Value Date   LDLCALC 77 05/05/2021  -Current treatment: N/a -Educated on Cholesterol goals; Benefits of statin for ASCVD risk reduction; -Will reach out to pcp about trial of lower dose of crestor - 5mg  daily or 5mg  TIW, or possibly trial of pravastatin 40mg  daily   Chronic Pain (Goal: improve function) - DJD of knee, lumbosacral spondylosis. Back surgery 2012, and 09/2021, has been on opioids ever since -Improved - pt reports that she is improving gradually since most recent procedure -Current treatment  Hydrocodone-APAP 5-325 mg q6h PRN (#90/month) -Counseled on using lowest effective dose of hydrocodone to manage pain -Reports to constipation since most recent procedure - unrelieved by linzess - straining to have BM - advised for patient to trial docusate 50-100mg  daily   Health Maintenance -Vaccine gaps: covid booster  Patient Goals/Self-Care Activities Patient will:  - take medications as prescribed    Care Gaps: COVID booster   Medication Assistance: None required.  Patient affirms current coverage meets needs.  Patient's preferred pharmacy is:  [facility] - [place], [name] - [address] [name] [address] Phone: [phone] Fax: [phone]   Uses pill box? No - keeps bottles Pt  endorses 100% compliance  Care Plan and Follow  Up Patient Decision:  Patient agrees to Care Plan and Follow-up.  Plan: Telephone follow up appointment with care management team member scheduled for:  6 months  Tomasa Blase, PharmD Clinical Pharmacist, [place]

## 2021-11-18 ENCOUNTER — Other Ambulatory Visit: Payer: Self-pay | Admitting: Internal Medicine

## 2021-11-18 DIAGNOSIS — M961 Postlaminectomy syndrome, not elsewhere classified: Secondary | ICD-10-CM

## 2021-11-18 DIAGNOSIS — M47817 Spondylosis without myelopathy or radiculopathy, lumbosacral region: Secondary | ICD-10-CM

## 2021-11-18 DIAGNOSIS — M170 Bilateral primary osteoarthritis of knee: Secondary | ICD-10-CM

## 2021-11-22 DIAGNOSIS — M47817 Spondylosis without myelopathy or radiculopathy, lumbosacral region: Secondary | ICD-10-CM | POA: Diagnosis not present

## 2021-12-01 ENCOUNTER — Ambulatory Visit (INDEPENDENT_AMBULATORY_CARE_PROVIDER_SITE_OTHER): Payer: PPO | Admitting: Orthopaedic Surgery

## 2021-12-01 ENCOUNTER — Ambulatory Visit (INDEPENDENT_AMBULATORY_CARE_PROVIDER_SITE_OTHER): Payer: PPO

## 2021-12-01 VITALS — BP 136/67 | HR 55

## 2021-12-01 DIAGNOSIS — Z981 Arthrodesis status: Secondary | ICD-10-CM | POA: Diagnosis not present

## 2021-12-01 NOTE — Progress Notes (Signed)
Post-Op Visit Note   Patient: Claudia [name]           Date of Birth: [birth year]           MRN: [id number] Visit Date: 12/01/2021 PCP: Janith Lima, MD   Assessment & Plan: Postop three-level cervical fusion C3-4, C4-5, C5-6.  Patient's husband passed away since I last saw her with dimension problems and renal problems.  She has [employer] been coming out of her house and they have been taking her collar off when she takes a bath.  Her specific instructions told her to keep collar on at all times and she was post used extra collar wrapped in Saran wrap when she took a shower and this has not been followed.  Flexion-extension x-rays show motion at the bottom level with lack of graft incorporation.  Chief Complaint:  Chief Complaint  Patient presents with   Neck - Follow-up   Visit Diagnoses:  1. S/P cervical spinal fusion     Plan: New collar given she needs to keep it on at all times as instructed.  Albumin level is low 2.7 and 1 other level 1.7.  She needs to increase her protein uptake.  Since her husband passed she states she is lost 50 pounds over the last few months.  We discussed increase protein intake and also increase ambulation with her walker in her house to increase her walking distance and improve her lower extremity strength.  She has been taking hydrocodone from Dr. [name] and that narcotic may be causing some appetite suppression as well.  We discussed she needs improved nutrition to help with increasing muscle strength.  She has family in [place] was living alone in her house.  [employer] is coming in 20 hours/week currently.  Continue collar recheck 1 month repeat lateral flexion-extension x-rays on return we discussed that if her fusion does not heal and she is symptomatic then posterior fusion may be required.  We reviewed her scan before surgery which showed abnormality of the cord from compression and although her sensation in her hands are much  better than before surgery she still has tingling in her hands and still has weakness of her lower extremities.  Follow-Up Instructions: No follow-ups on file.   Orders:  Orders Placed This Encounter  Procedures   XR Cervical Spine 2 or 3 views   No orders of the defined types were placed in this encounter.   Imaging: No results found.  PMFS History: Patient Active Problem List   Diagnosis Date Noted   S/P cervical spinal fusion 11/10/2021   Cervical stricture or stenosis 10/20/2021   Cervical spinal stenosis 10/19/2021   Meningioma ([place]) 08/24/2021   PAF (paroxysmal atrial fibrillation) ([place]) 11/03/2020   Stage 3b chronic kidney disease ([place]) 05/06/2020   Chronic left-sided low back pain without sciatica 12/25/2018   Spinal stenosis in cervical region 05/12/2016   Lower extremity edema 03/04/2016   Therapeutic opioid-induced constipation (OIC) 01/01/2016   Chronic idiopathic constipation 09/11/2013   Lumbosacral spondylosis without myelopathy 04/09/2013   Postlaminectomy syndrome, lumbar region 04/09/2013   Anticoagulation management encounter 02/01/2012   Neuropathy, peripheral 08/04/2011   Pure hypercholesterolemia 08/04/2011   Essential hypertension, benign 08/04/2011   Hypothyroidism 08/04/2011   DJD (degenerative joint disease) of knee 08/04/2011   Gout 08/04/2011   Past Medical History:  Diagnosis Date   Abnormality of gait    Brachial neuritis or radiculitis NOS    Degeneration of lumbar or lumbosacral  intervertebral disc    Dysrhythmia    Essential hypertension, benign    Gouty arthropathy    Lumbago    Obesity    Osteoarthritis    Pure hypercholesterolemia    Unspecified hypothyroidism     Family History  Problem Relation Age of Onset   Heart disease Father    Heart attack Father    Hypertension Mother    Alzheimer's disease Mother    Diabetes Sister    Cirrhosis Brother    Diabetes Sister    Diabetes Sister    Diabetes Brother    Heart attack  Brother    Cancer Neg Hx    Kidney disease Neg Hx     Past Surgical History:  Procedure Laterality Date   ABDOMINAL HYSTERECTOMY  03/13/2010   ANTERIOR CERVICAL DECOMP/DISCECTOMY FUSION N/A 10/19/2021   Procedure: C3-4, C4-5, C5-6 ANTERIOR CERVICAL DISCECTOMY FUSION, ALLOGRAFT, PLATE;  Surgeon: [name], MD;  Location: [facility];  Service: Orthopedics;  Laterality: N/A;   BACK SURGERY     LUMBAR LAMINECTOMY  03/13/2010   right knee replacement  03/13/2010   Social History   Occupational History   Not on file  Tobacco Use   Smoking status: Never   Smokeless tobacco: Never  Vaping Use   Vaping Use: Never used  Substance and Sexual Activity   Alcohol use: Yes    Comment: 1 glass wine/month   Drug use: No   Sexual activity: Never

## 2021-12-01 NOTE — Progress Notes (Deleted)
Office Visit Note   Patient: Claudia [name]           Date of Birth: [birth year]           MRN: [id number] Visit Date: 12/01/2021              Requested by: Janith Lima, MD [address] [place],  [place] [address] PCP: Janith Lima, MD   Assessment & Plan: Visit Diagnoses:  1. S/P cervical spinal fusion     Plan: ***  Follow-Up Instructions: No follow-ups on file.   Orders:  Orders Placed This Encounter  Procedures   XR Cervical Spine 2 or 3 views   No orders of the defined types were placed in this encounter.     Procedures: No procedures performed   Clinical Data: No additional findings.   Subjective: Chief Complaint  Patient presents with   Neck - Follow-up    HPI  Review of Systems   Objective: Vital Signs: BP 136/67   Pulse (!) 55   Physical Exam  Ortho Exam  Specialty Comments:  No specialty comments available.  Imaging: No results found.   PMFS History: Patient Active Problem List   Diagnosis Date Noted   S/P cervical spinal fusion 11/10/2021   Cervical stricture or stenosis 10/20/2021   Cervical spinal stenosis 10/19/2021   Meningioma ([place]) 08/24/2021   PAF (paroxysmal atrial fibrillation) ([place]) 11/03/2020   Stage 3b chronic kidney disease ([place]) 05/06/2020   Chronic left-sided low back pain without sciatica 12/25/2018   Spinal stenosis in cervical region 05/12/2016   Lower extremity edema 03/04/2016   Therapeutic opioid-induced constipation (OIC) 01/01/2016   Chronic idiopathic constipation 09/11/2013   Lumbosacral spondylosis without myelopathy 04/09/2013   Postlaminectomy syndrome, lumbar region 04/09/2013   Anticoagulation management encounter 02/01/2012   Neuropathy, peripheral 08/04/2011   Pure hypercholesterolemia 08/04/2011   Essential hypertension, benign 08/04/2011   Hypothyroidism 08/04/2011   DJD (degenerative joint disease) of knee 08/04/2011   Gout 08/04/2011   Past Medical History:  Diagnosis  Date   Abnormality of gait    Brachial neuritis or radiculitis NOS    Degeneration of lumbar or lumbosacral intervertebral disc    Dysrhythmia    Essential hypertension, benign    Gouty arthropathy    Lumbago    Obesity    Osteoarthritis    Pure hypercholesterolemia    Unspecified hypothyroidism     Family History  Problem Relation Age of Onset   Heart disease Father    Heart attack Father    Hypertension Mother    Alzheimer's disease Mother    Diabetes Sister    Cirrhosis Brother    Diabetes Sister    Diabetes Sister    Diabetes Brother    Heart attack Brother    Cancer Neg Hx    Kidney disease Neg Hx     Past Surgical History:  Procedure Laterality Date   ABDOMINAL HYSTERECTOMY  03/13/2010   ANTERIOR CERVICAL DECOMP/DISCECTOMY FUSION N/A 10/19/2021   Procedure: C3-4, C4-5, C5-6 ANTERIOR CERVICAL DISCECTOMY FUSION, ALLOGRAFT, PLATE;  Surgeon: [name], MD;  Location: [facility];  Service: Orthopedics;  Laterality: N/A;   BACK SURGERY     LUMBAR LAMINECTOMY  03/13/2010   right knee replacement  03/13/2010   Social History   Occupational History   Not on file  Tobacco Use   Smoking status: Never   Smokeless tobacco: Never  Vaping Use   Vaping Use: Never used  Substance and  Sexual Activity   Alcohol use: Yes    Comment: 1 glass wine/month   Drug use: No   Sexual activity: Never

## 2021-12-07 ENCOUNTER — Telehealth: Payer: Self-pay | Admitting: Orthopaedic Surgery

## 2021-12-07 NOTE — Telephone Encounter (Signed)
Pt called and states that she needs to speak to someone about extending the home care. She states she can't change her bed or shower by herself  CB [phone]    Please call her

## 2021-12-08 ENCOUNTER — Other Ambulatory Visit: Payer: Self-pay | Admitting: Orthopaedic Surgery

## 2021-12-08 ENCOUNTER — Ambulatory Visit (INDEPENDENT_AMBULATORY_CARE_PROVIDER_SITE_OTHER): Payer: PPO

## 2021-12-08 VITALS — Ht 70.0 in | Wt 158.0 lb

## 2021-12-08 DIAGNOSIS — Z0000 Encounter for general adult medical examination without abnormal findings: Secondary | ICD-10-CM

## 2021-12-08 NOTE — Progress Notes (Signed)
I connected with Dynver Clemson today by telephone and verified that I am speaking with the correct person using two identifiers. Location patient: home Location provider: work Persons participating in the virtual visit: patient, provider.   I discussed the limitations, risks, security and privacy concerns of performing an evaluation and management service by telephone and the availability of in person appointments. I also discussed with the patient that there may be a patient responsible charge related to this service. The patient expressed understanding and verbally consented to this telephonic visit.    Interactive audio and video telecommunications were attempted between this provider and patient, however failed, due to patient having technical difficulties OR patient did not have access to video capability.  We continued and completed visit with audio only.  Some vital signs may be absent or patient reported.   Time Spent with patient on telephone encounter: 30 minutes  Subjective:   Claudia [name] is a 88 y.o. female who presents for Medicare Annual (Subsequent) preventive examination.  Review of Systems     Cardiac Risk Factors include: advanced age (>35mn, >>28women);family history of premature cardiovascular disease;hypertension     Objective:    Today's Vitals   12/08/21 1359  Weight: 158 lb (71.7 kg)  Height: '5\' 10"'$  (1.778 m)  PainSc: 0-No pain   Body mass index is 22.67 kg/m.     12/08/2021    2:03 PM 10/19/2021    6:06 AM 10/13/2021    2:16 PM 10/08/2020   11:08 AM 05/05/2020    2:11 PM 05/06/2016    9:20 AM 05/03/2016    6:30 PM  Advanced Directives  Does Patient Have a Medical Advance Directive? Yes Yes Yes Yes Yes No No  Type of Advance Directive Living will;Healthcare Power of [place] Living will;Healthcare Power of Attorney Living will;Healthcare Power of Attorney    Does  patient want to make changes to medical advance directive? No - Patient declined   No - Patient declined No - Patient declined    Copy of [place] Chart? No - copy requested No - copy requested   No - copy requested    Would patient like information on creating a medical advance directive?      No - patient declined information No - patient declined information    Current Medications (verified) Outpatient Encounter Medications as of 12/08/2021  Medication Sig   diltiazem (CARDIZEM) 30 MG tablet TAKE ONE TABLET EVERY 4 HOURS AS NEEDED FOR afib heart rate greater THAN 100 aslong as blood pressure less THAN 100   HYDROcodone-acetaminophen (NORCO/VICODIN) 5-325 MG tablet TAKE ONE TABLET BY MOUTH EVERY 8 HOURS BEFORE MEALS   linaclotide (LINZESS) 290 MCG CAPS capsule Take 1 capsule (290 mcg total) by mouth daily before breakfast.   rivaroxaban (XARELTO) 20 MG TABS tablet Take 1 tablet (20 mg total) by mouth daily with supper.   torsemide (DEMADEX) 20 MG tablet Take 1 tablet (20 mg total) by mouth daily.   vitamin C (ASCORBIC ACID) 500 MG tablet Take 500 mg by mouth daily.   vitamin E 200 UNIT capsule Take 200 Units by mouth daily.   No facility-administered encounter medications on file as of 12/08/2021.    Allergies (verified) Lipitor [atorvastatin] and Trileptal [oxcarbazepine]   History: Past Medical History:  Diagnosis Date   Abnormality of gait    Brachial neuritis or radiculitis NOS    Degeneration of lumbar or  lumbosacral intervertebral disc    Dysrhythmia    Essential hypertension, benign    Gouty arthropathy    Lumbago    Obesity    Osteoarthritis    Pure hypercholesterolemia    Unspecified hypothyroidism    Past Surgical History:  Procedure Laterality Date   ABDOMINAL HYSTERECTOMY  03/13/2010   ANTERIOR CERVICAL DECOMP/DISCECTOMY FUSION N/A 10/19/2021   Procedure: C3-4, C4-5, C5-6 ANTERIOR CERVICAL DISCECTOMY FUSION, ALLOGRAFT, PLATE;  Surgeon: [name], MD;  Location: [facility];  Service: Orthopedics;  Laterality: N/A;   BACK SURGERY     LUMBAR LAMINECTOMY  03/13/2010   right knee replacement  03/13/2010   Family History  Problem Relation Age of Onset   Heart disease Father    Heart attack Father    Hypertension Mother    Alzheimer's disease Mother    Diabetes Sister    Cirrhosis Brother    Diabetes Sister    Diabetes Sister    Diabetes Brother    Heart attack Brother    Cancer Neg Hx    Kidney disease Neg Hx    Social History   Socioeconomic History   Marital status: Married    Spouse name: Not on file   Number of children: Not on file   Years of education: Not on file   Highest education level: Not on file  Occupational History   Not on file  Tobacco Use   Smoking status: Never   Smokeless tobacco: Never  Vaping Use   Vaping Use: Never used  Substance and Sexual Activity   Alcohol use: Yes    Comment: 1 glass wine/month   Drug use: No   Sexual activity: Never  Other Topics Concern   Not on file  Social History Narrative   Not on file   Social Determinants of Health   Financial Resource Strain: Low Risk  (12/08/2021)   Overall Financial Resource Strain (CARDIA)    Difficulty of Paying Living Expenses: Not hard at all  Food Insecurity: No Food Insecurity (12/08/2021)   Hunger Vital Sign    Worried About Running Out of Food in the Last Year: Never true    Ran Out of Food in the Last Year: Never true  Transportation Needs: No Transportation Needs (12/08/2021)   PRAPARE - Hydrologist (Medical): No    Lack of Transportation (Non-Medical): No  Physical Activity: Inactive (12/08/2021)   Exercise Vital Sign    Days of Exercise per Week: 0 days    Minutes of Exercise per Session: 0 min  Stress: No Stress Concern Present (12/08/2021)   [place]    Feeling of Stress : Not at all  Social Connections: Moderately Isolated  (12/08/2021)   Social Connection and Isolation Panel [NHANES]    Frequency of Communication with Friends and Family: More than three times a week    Frequency of Social Gatherings with Friends and Family: Never    Attends Religious Services: 1 to 4 times per year    Active Member of [employer] or Organizations: No    Attends Archivist Meetings: Never    Marital Status: Widowed    Tobacco Counseling Counseling given: Not Answered   Clinical Intake:  Pre-visit preparation completed: Yes  Pain : No/denies pain Pain Score: 0-No pain     BMI - recorded: 22.67 Nutritional Status: BMI <19  Underweight Nutritional Risks: Unintentional weight loss Diabetes: No  How often  do you need to have someone help you when you read instructions, pamphlets, or other written materials from your doctor or pharmacy?: 1 - Never What is the last grade level you completed in school?: HSG; some college  Diabetic? no  Interpreter Needed?: No  Information entered by :: Lisette Abu, LPN.   Activities of Daily Living    12/08/2021    2:06 PM 10/13/2021    2:18 PM  In your present state of health, do you have any difficulty performing the following activities:  Hearing? 0   Vision? 0   Difficulty concentrating or making decisions? 0   Walking or climbing stairs? 1   Dressing or bathing? 0   Doing errands, shopping? 0 0  Preparing Food and eating ? N   Using the Toilet? N   In the past six months, have you accidently leaked urine? N   Do you have problems with loss of bowel control? N   Managing your Medications? N   Managing your Finances? N   Housekeeping or managing your Housekeeping? N     Patient Care Team: Janith Lima, MD as PCP - General (Internal Medicine) Skeet Latch, MD as PCP - Cardiology (Cardiology) Phylliss Bob, MD as Consulting Physician (Orthopedic Surgery) Alanda Slim Neena Rhymes, MD as Consulting Physician (Ophthalmology) Charlton Haws, [facility] as  Pharmacist (Pharmacist)  Indicate any recent Medical Services you may have received from other than [facility] providers in the past year (date may be approximate).     Assessment:   This is a routine wellness examination for [place].  Hearing/Vision screen Hearing Screening - Comments:: Patient denied any hearing difficulty.   No hearing aids.  Vision Screening - Comments:: Patient wears readers for fine print. Eye exam done by: Awanda Mink., MD.  Dietary issues and exercise activities discussed: Current Exercise Habits: The patient does not participate in regular exercise at present, Exercise limited by: neurologic condition(s);orthopedic condition(s);cardiac condition(s)   Goals Addressed             This Visit's Progress    Patient declined health goal at this time.        Depression Screen    12/08/2021    2:06 PM 06/02/2021    2:14 PM 05/05/2020    1:14 PM 06/13/2018    1:15 PM 04/19/2017   10:51 AM 09/03/2015    2:38 PM 05/13/2015   11:54 AM  PHQ 2/9 Scores  PHQ - 2 Score 0 0 0 0 0 0 0    Fall Risk    12/08/2021    2:04 PM 06/02/2021    2:13 PM 05/05/2020    1:14 PM 05/18/2019   12:34 PM 06/13/2018    1:15 PM  [place] in the past year? 0 0 0 0 0  Comment    Emmi Telephone Survey: data to providers prior to load   Number falls in past yr: 0  0  0  Injury with Fall? 0  0  0  Risk for fall due to : Other (Comment);Impaired mobility;Orthopedic patient      Risk for fall due to: Comment s/p back surgery      Follow up Falls evaluation completed;Falls prevention discussed    Falls evaluation completed    FALL RISK PREVENTION PERTAINING TO THE HOME:  Any stairs in or around the home? No  If so, are there any without handrails? No  Home free of loose throw rugs in walkways, pet beds, electrical  cords, etc? Yes  Adequate lighting in your home to reduce risk of falls? Yes   ASSISTIVE DEVICES UTILIZED TO PREVENT FALLS:  Life alert? No  Use of a  cane, walker or w/c? Yes  Grab bars in the bathroom? Yes  Shower chair or bench in shower? Yes  Elevated toilet seat or a handicapped toilet? Yes   TIMED UP AND GO:  Was the test performed? No .  Length of time to ambulate 10 feet: n/a sec.   Appearance of gait: Patient not evaluated for gait during this visit.  Cognitive Function:        12/08/2021    2:07 PM 05/05/2020    2:13 PM  6CIT Screen  What Year? 0 points 0 points  What month? 0 points 0 points  What time? 0 points 0 points  Count back from 20 0 points 0 points  Months in reverse 0 points 0 points  Repeat phrase 0 points 0 points  Total Score 0 points 0 points    Immunizations Immunization History  Administered Date(s) Administered   DTaP 01/01/2008   Fluad Quad(high Dose 65+) 03/30/2019, 05/05/2021   Influenza Split 04/06/2012   Influenza Whole 05/04/2011   Influenza, High Dose Seasonal PF 04/17/2013, 03/04/2016, 04/19/2017, 04/07/2018   Influenza,inj,Quad PF,6+ Mos 04/11/2014, 04/14/2015   Influenza-Unspecified 04/15/2020   PFIZER(Purple Top)SARS-COV-2 Vaccination 07/30/2019, 08/25/2019, 03/15/2020, 11/19/2020   Pneumococcal Conjugate-13 04/02/2008, 10/08/2013   Pneumococcal Polysaccharide-23 09/02/2015, 11/03/2020   Tdap 01/01/2008, 08/23/2017   Zoster Recombinat (Shingrix) 01/03/2017, 04/02/2017   Zoster, Live 08/03/2006    TDAP status: Up to date  Flu Vaccine status: Up to date  Pneumococcal vaccine status: Up to date  Covid-19 vaccine status: Completed vaccines  Qualifies for Shingles Vaccine? Yes   Zostavax completed Yes   Shingrix Completed?: Yes  Screening Tests Health Maintenance  Topic Date Due   COVID-19 Vaccine (5 - Pfizer series) 01/14/2021   INFLUENZA VACCINE  01/26/2022   TETANUS/TDAP  08/24/2027   Pneumonia Vaccine 43+ Years old  Completed   DEXA SCAN  Completed   Zoster Vaccines- Shingrix  Completed   HPV VACCINES  Aged Out    Health Maintenance  Health Maintenance Due   Topic Date Due   COVID-19 Vaccine (5 - Pfizer series) 01/14/2021    Colorectal cancer screening: No longer required.   Mammogram status: No longer required due to age/patient refusal.  Lung Cancer Screening: (Low Dose CT Chest recommended if Age 89-80 years, 30 pack-year currently smoking OR have quit w/in 15years.) does not qualify.   Lung Cancer Screening Referral: no  Additional Screening:  Hepatitis C Screening: does not qualify; Completed no  Vision Screening: Recommended annual ophthalmology exams for early detection of glaucoma and other disorders of the eye. Is the patient up to date with their annual eye exam?  Yes  Who is the provider or what is the name of the office in which the patient attends annual eye exams? Awanda Mink, MD. If pt is not established with a provider, would they like to be referred to a provider to establish care? No .   Dental Screening: Recommended annual dental exams for proper oral hygiene  Community Resource Referral / Chronic Care Management: CRR required this visit?  No   CCM required this visit?  No      Plan:     I have personally reviewed and noted the following in the patient's chart:   Medical and social history Use of alcohol, tobacco or  illicit drugs  Current medications and supplements including opioid prescriptions.  Functional ability and status Nutritional status Physical activity Advanced directives List of other physicians Hospitalizations, surgeries, and ER visits in previous 12 months Vitals Screenings to include cognitive, depression, and falls Referrals and appointments  In addition, I have reviewed and discussed with patient certain preventive protocols, quality metrics, and best practice recommendations. A written personalized care plan for preventive services as well as general preventive health recommendations were provided to patient.     Sheral Flow, LPN   09/24/760   Nurse Notes:   There were no vitals filed for this visit. Height and weight provided by patient during this visit. Medications reviewed with patient; yes opioid use noted.

## 2021-12-08 NOTE — Patient Instructions (Signed)
Claudia [name] , Thank you for taking time to come for your Medicare Wellness Visit. I appreciate your ongoing commitment to your health goals. Please review the following plan we discussed and let me know if I can assist you in the future.   Screening recommendations/referrals: Colonoscopy: Discontinued due to age 88: Discontinued due to age/patient refusal Bone Density: Discontinued due to age/patient refusal Recommended yearly ophthalmology/optometry visit for glaucoma screening and checkup Recommended yearly dental visit for hygiene and checkup  Vaccinations: Influenza vaccine: 05/05/2021 Pneumococcal vaccine: 10/08/2013, 11/03/2020 Tdap vaccine: 08/23/2017 Shingles vaccine: 01/03/2017, 04/02/2017   Covid-19: 07/30/2019, 08/25/2019, 03/15/2020, 11/19/2020  Advanced directives: Yes; Please bring a copy of your health care power of attorney and living will to the office at your convenience.  Conditions/risks identified: Yes  Next appointment: Please schedule your next Medicare Wellness Visit with your Nurse Health Advisor in 1 year by calling [phone].   Preventive Care 4 Years and Older, Female Preventive care refers to lifestyle choices and visits with your health care provider that can promote health and wellness. What does preventive care include? A yearly physical exam. This is also called an annual well check. Dental exams once or twice a year. Routine eye exams. Ask your health care provider how often you should have your eyes checked. Personal lifestyle choices, including: Daily care of your teeth and gums. Regular physical activity. Eating a healthy diet. Avoiding tobacco and drug use. Limiting alcohol use. Practicing safe sex. Taking low-dose aspirin every day. Taking vitamin and mineral supplements as recommended by your health care provider. What happens during an annual well check? The services and screenings done by your health care provider during your annual well  check will depend on your age, overall health, lifestyle risk factors, and family history of disease. Counseling  Your health care provider may ask you questions about your: Alcohol use. Tobacco use. Drug use. Emotional well-being. Home and relationship well-being. Sexual activity. Eating habits. History of falls. Memory and ability to understand (cognition). Work and work Statistician. Reproductive health. Screening  You may have the following tests or measurements: Height, weight, and BMI. Blood pressure. Lipid and cholesterol levels. These may be checked every 5 years, or more frequently if you are over 81 years old. Skin check. Lung cancer screening. You may have this screening every year starting at age 71 if you have a 30-pack-year history of smoking and currently smoke or have quit within the past 15 years. Fecal occult blood test (FOBT) of the stool. You may have this test every year starting at age 44. Flexible sigmoidoscopy or colonoscopy. You may have a sigmoidoscopy every 5 years or a colonoscopy every 10 years starting at age 73. Hepatitis C blood test. Hepatitis B blood test. Sexually transmitted disease (STD) testing. Diabetes screening. This is done by checking your blood sugar (glucose) after you have not eaten for a while (fasting). You may have this done every 1-3 years. Bone density scan. This is done to screen for osteoporosis. You may have this done starting at age 61. Mammogram. This may be done every 1-2 years. Talk to your health care provider about how often you should have regular mammograms. Talk with your health care provider about your test results, treatment options, and if necessary, the need for more tests. Vaccines  Your health care provider may recommend certain vaccines, such as: Influenza vaccine. This is recommended every year. Tetanus, diphtheria, and acellular pertussis (Tdap, Td) vaccine. You may need a Td booster every 10 years.  Zoster  vaccine. You may need this after age 38. Pneumococcal 13-valent conjugate (PCV13) vaccine. One dose is recommended after age 53. Pneumococcal polysaccharide (PPSV23) vaccine. One dose is recommended after age 32. Talk to your health care provider about which screenings and vaccines you need and how often you need them. This information is not intended to replace advice given to you by your health care provider. Make sure you discuss any questions you have with your health care provider. Document Released: 07/11/2015 Document Revised: 03/03/2016 Document Reviewed: 04/15/2015 Elsevier Interactive Patient Education  2017 [employer] Prevention in the Home Falls can cause injuries. They can happen to people of all ages. There are many things you can do to make your home safe and to help prevent falls. What can I do on the outside of my home? Regularly fix the edges of walkways and driveways and fix any cracks. Remove anything that might make you trip as you walk through a door, such as a raised step or threshold. Trim any bushes or trees on the path to your home. Use bright outdoor lighting. Clear any walking paths of anything that might make someone trip, such as rocks or tools. Regularly check to see if handrails are loose or broken. Make sure that both sides of any steps have handrails. Any raised decks and porches should have guardrails on the edges. Have any leaves, snow, or ice cleared regularly. Use sand or salt on walking paths during winter. Clean up any spills in your garage right away. This includes oil or grease spills. What can I do in the bathroom? Use night lights. Install grab bars by the toilet and in the tub and shower. Do not use towel bars as grab bars. Use non-skid mats or decals in the tub or shower. If you need to sit down in the shower, use a plastic, non-slip stool. Keep the floor dry. Clean up any water that spills on the floor as soon as it happens. Remove  soap buildup in the tub or shower regularly. Attach bath mats securely with double-sided non-slip rug tape. Do not have throw rugs and other things on the floor that can make you trip. What can I do in the bedroom? Use night lights. Make sure that you have a light by your bed that is easy to reach. Do not use any sheets or blankets that are too big for your bed. They should not hang down onto the floor. Have a firm chair that has side arms. You can use this for support while you get dressed. Do not have throw rugs and other things on the floor that can make you trip. What can I do in the kitchen? Clean up any spills right away. Avoid walking on wet floors. Keep items that you use a lot in easy-to-reach places. If you need to reach something above you, use a strong step stool that has a grab bar. Keep electrical cords out of the way. Do not use floor polish or wax that makes floors slippery. If you must use wax, use non-skid floor wax. Do not have throw rugs and other things on the floor that can make you trip. What can I do with my stairs? Do not leave any items on the stairs. Make sure that there are handrails on both sides of the stairs and use them. Fix handrails that are broken or loose. Make sure that handrails are as long as the stairways. Check any carpeting to make sure that  it is firmly attached to the stairs. Fix any carpet that is loose or worn. Avoid having throw rugs at the top or bottom of the stairs. If you do have throw rugs, attach them to the floor with carpet tape. Make sure that you have a light switch at the top of the stairs and the bottom of the stairs. If you do not have them, ask someone to add them for you. What else can I do to help prevent falls? Wear shoes that: Do not have high heels. Have rubber bottoms. Are comfortable and fit you well. Are closed at the toe. Do not wear sandals. If you use a stepladder: Make sure that it is fully opened. Do not climb a  closed stepladder. Make sure that both sides of the stepladder are locked into place. Ask someone to hold it for you, if possible. Clearly mark and make sure that you can see: Any grab bars or handrails. First and last steps. Where the edge of each step is. Use tools that help you move around (mobility aids) if they are needed. These include: Canes. Walkers. Scooters. Crutches. Turn on the lights when you go into a dark area. Replace any light bulbs as soon as they burn out. Set up your furniture so you have a clear path. Avoid moving your furniture around. If any of your floors are uneven, fix them. If there are any pets around you, be aware of where they are. Review your medicines with your doctor. Some medicines can make you feel dizzy. This can increase your chance of falling. Ask your doctor what other things that you can do to help prevent falls. This information is not intended to replace advice given to you by your health care provider. Make sure you discuss any questions you have with your health care provider. Document Released: 04/10/2009 Document Revised: 11/20/2015 Document Reviewed: 07/19/2014 Elsevier Interactive Patient Education  2017 [employer].

## 2021-12-09 NOTE — Telephone Encounter (Signed)
Faxed to provided number  

## 2021-12-09 NOTE — Telephone Encounter (Signed)
Fax [phone]

## 2021-12-10 ENCOUNTER — Other Ambulatory Visit: Payer: Self-pay | Admitting: Internal Medicine

## 2021-12-10 DIAGNOSIS — I10 Essential (primary) hypertension: Secondary | ICD-10-CM

## 2021-12-17 ENCOUNTER — Other Ambulatory Visit: Payer: Self-pay | Admitting: Internal Medicine

## 2021-12-17 DIAGNOSIS — M961 Postlaminectomy syndrome, not elsewhere classified: Secondary | ICD-10-CM

## 2021-12-17 DIAGNOSIS — M170 Bilateral primary osteoarthritis of knee: Secondary | ICD-10-CM

## 2021-12-17 DIAGNOSIS — M47817 Spondylosis without myelopathy or radiculopathy, lumbosacral region: Secondary | ICD-10-CM

## 2021-12-23 DIAGNOSIS — M47817 Spondylosis without myelopathy or radiculopathy, lumbosacral region: Secondary | ICD-10-CM | POA: Diagnosis not present

## 2021-12-24 ENCOUNTER — Ambulatory Visit: Payer: PPO | Admitting: Internal Medicine

## 2022-01-05 ENCOUNTER — Encounter: Payer: Self-pay | Admitting: Orthopaedic Surgery

## 2022-01-05 ENCOUNTER — Ambulatory Visit (INDEPENDENT_AMBULATORY_CARE_PROVIDER_SITE_OTHER): Payer: PPO

## 2022-01-05 ENCOUNTER — Ambulatory Visit (INDEPENDENT_AMBULATORY_CARE_PROVIDER_SITE_OTHER): Payer: PPO | Admitting: Orthopaedic Surgery

## 2022-01-05 VITALS — BP 164/71 | HR 71 | Ht 70.0 in | Wt 177.0 lb

## 2022-01-05 DIAGNOSIS — Z981 Arthrodesis status: Secondary | ICD-10-CM

## 2022-01-05 NOTE — Progress Notes (Signed)
Post-Op Visit Note   Patient: Claudia [name]           Date of Birth: 1933-11-29           MRN: [id number] Visit Date: 01/05/2022 PCP: Janith Lima, MD   Assessment & Plan: Follow-up three-level cervical fusion with myelopathic changes.  She is using her walker but states she is walking better still has some numbness in her hands but strength is improved and gait is improved.  She has history of atrial fibrillation, meningioma, stage III kidney disease previous lumbar laminectomy, hypertension, peripheral neuropathy in addition to the cervical stenosis and three-level fusion.  X-rays demonstrate bottom level may be healing a little slower than the others.  Chief Complaint:  Chief Complaint  Patient presents with   Neck - Routine Post Op   Visit Diagnoses:  1. S/P cervical spinal fusion     Plan: Check her back again in 3 months AP, right and left oblique x-rays cervical spine and lateral flexion-extension x-rays cervical spine on return visit.  She can discontinue her collar.  If she has increased pain she can reapply it and call and let us know.  She will continue to try to increase her walking distance and improve her strength and activities.  She has some help coming in part-time for the next 20 weeks to help.  Follow-Up Instructions: Return in about 3 months (around 04/07/2022).   Orders:  Orders Placed This Encounter  Procedures   XR Cervical Spine 2 or 3 views   No orders of the defined types were placed in this encounter.   Imaging: XR Cervical Spine 2 or 3 views  Result Date: 01/05/2022 AP and lateral flexion-extension cervical spine images are obtained and reviewed this shows 3 level cervical fusion C3-C6.  There appears to be slight motion at C5-6 but no loosening of the screws.  This is suggestive of pseudoarthrosis or delayed union. Impression: Post three-level cervical fusion.  X-rays suggest delayed graft incorporation at C5-6 level.   PMFS  History: Patient Active Problem List   Diagnosis Date Noted   S/P cervical spinal fusion 11/10/2021   Cervical stricture or stenosis 10/20/2021   Cervical spinal stenosis 10/19/2021   Meningioma ([place]) 08/24/2021   PAF (paroxysmal atrial fibrillation) ([place]) 11/03/2020   Stage 3b chronic kidney disease ([place]) 05/06/2020   Chronic left-sided low back pain without sciatica 12/25/2018   Spinal stenosis in cervical region 05/12/2016   Lower extremity edema 03/04/2016   Therapeutic opioid-induced constipation (OIC) 01/01/2016   Chronic idiopathic constipation 09/11/2013   Lumbosacral spondylosis without myelopathy 04/09/2013   Postlaminectomy syndrome, lumbar region 04/09/2013   Anticoagulation management encounter 02/01/2012   Neuropathy, peripheral 08/04/2011   Pure hypercholesterolemia 08/04/2011   Essential hypertension, benign 08/04/2011   Hypothyroidism 08/04/2011   DJD (degenerative joint disease) of knee 08/04/2011   Gout 08/04/2011   Past Medical History:  Diagnosis Date   Abnormality of gait    Brachial neuritis or radiculitis NOS    Degeneration of lumbar or lumbosacral intervertebral disc    Dysrhythmia    Essential hypertension, benign    Gouty arthropathy    Lumbago    Obesity    Osteoarthritis    Pure hypercholesterolemia    Unspecified hypothyroidism     Family History  Problem Relation Age of Onset   Heart disease Father    Heart attack Father    Hypertension Mother    Alzheimer's disease Mother    Diabetes Sister  Cirrhosis Brother    Diabetes Sister    Diabetes Sister    Diabetes Brother    Heart attack Brother    Cancer Neg Hx    Kidney disease Neg Hx     Past Surgical History:  Procedure Laterality Date   ABDOMINAL HYSTERECTOMY  03/13/2010   ANTERIOR CERVICAL DECOMP/DISCECTOMY FUSION N/A 10/19/2021   Procedure: C3-4, C4-5, C5-6 ANTERIOR CERVICAL DISCECTOMY FUSION, ALLOGRAFT, PLATE;  Surgeon: [name], MD;  Location: [facility];  Service:  Orthopedics;  Laterality: N/A;   BACK SURGERY     LUMBAR LAMINECTOMY  03/13/2010   right knee replacement  03/13/2010   Social History   Occupational History   Not on file  Tobacco Use   Smoking status: Never   Smokeless tobacco: Never  Vaping Use   Vaping Use: Never used  Substance and Sexual Activity   Alcohol use: Yes    Comment: 1 glass wine/month   Drug use: No   Sexual activity: Never

## 2022-01-08 ENCOUNTER — Telehealth: Payer: Self-pay | Admitting: Orthopaedic Surgery

## 2022-01-08 DIAGNOSIS — Z981 Arthrodesis status: Secondary | ICD-10-CM

## 2022-01-08 NOTE — Telephone Encounter (Signed)
Referral placed in chart. Pt was called and advised and stated understanding  

## 2022-01-08 NOTE — Telephone Encounter (Signed)
Patient called asked if she can be set up for (PT) Patient want to be set up for (PT) at [facility]. The number to contact patient is [phone]

## 2022-01-14 ENCOUNTER — Other Ambulatory Visit: Payer: Self-pay | Admitting: Internal Medicine

## 2022-01-14 DIAGNOSIS — M961 Postlaminectomy syndrome, not elsewhere classified: Secondary | ICD-10-CM

## 2022-01-14 DIAGNOSIS — M47817 Spondylosis without myelopathy or radiculopathy, lumbosacral region: Secondary | ICD-10-CM

## 2022-01-14 DIAGNOSIS — M170 Bilateral primary osteoarthritis of knee: Secondary | ICD-10-CM

## 2022-01-20 NOTE — Therapy (Signed)
OUTPATIENT PHYSICAL THERAPY EVALUATION   Patient Name: Claudia [name] MRN: [id number] DOB:Dec 21, 1933, 88 y.o., female Today's Date: 01/25/2022   PT End of Session - 01/25/22 1141     Visit Number 1    Number of Visits 20    Date for PT Re-Evaluation 04/05/22    Authorization Type [employer] $15 copay    PT Start Time 1144    PT Stop Time 1224    PT Time Calculation (min) 40 min    Activity Tolerance Patient tolerated treatment well    Behavior During Therapy WFL for tasks assessed/performed             Past Medical History:  Diagnosis Date   Abnormality of gait    Brachial neuritis or radiculitis NOS    Degeneration of lumbar or lumbosacral intervertebral disc    Dysrhythmia    Essential hypertension, benign    Gouty arthropathy    Lumbago    Obesity    Osteoarthritis    Pure hypercholesterolemia    Unspecified hypothyroidism    Past Surgical History:  Procedure Laterality Date   ABDOMINAL HYSTERECTOMY  03/13/2010   ANTERIOR CERVICAL DECOMP/DISCECTOMY FUSION N/A 10/19/2021   Procedure: C3-4, C4-5, C5-6 ANTERIOR CERVICAL DISCECTOMY FUSION, ALLOGRAFT, PLATE;  Surgeon: [name], MD;  Location: [facility];  Service: Orthopedics;  Laterality: N/A;   BACK SURGERY     LUMBAR LAMINECTOMY  03/13/2010   right knee replacement  03/13/2010   Patient Active Problem List   Diagnosis Date Noted   S/P cervical spinal fusion 11/10/2021   Cervical stricture or stenosis 10/20/2021   Cervical spinal stenosis 10/19/2021   Meningioma ([place]) 08/24/2021   PAF (paroxysmal atrial fibrillation) ([place]) 11/03/2020   Stage 3b chronic kidney disease ([place]) 05/06/2020   Chronic left-sided low back pain without sciatica 12/25/2018   Spinal stenosis in cervical region 05/12/2016   Lower extremity edema 03/04/2016   Therapeutic opioid-induced constipation (OIC) 01/01/2016   Chronic idiopathic constipation 09/11/2013   Lumbosacral spondylosis without myelopathy 04/09/2013   Postlaminectomy  syndrome, lumbar region 04/09/2013   Anticoagulation management encounter 02/01/2012   Neuropathy, peripheral 08/04/2011   Pure hypercholesterolemia 08/04/2011   Essential hypertension, benign 08/04/2011   Hypothyroidism 08/04/2011   DJD (degenerative joint disease) of knee 08/04/2011   Gout 08/04/2011    PCP: Janith Lima. MD  REFERRING PROVIDER: [name], MD  REFERRING DIAG: Z98.1 (ICD-10-CM) - S/P cervical spinal fusion  THERAPY DIAG:  Cervicalgia  Other abnormalities of gait and mobility  Muscle weakness (generalized)  Rationale for Evaluation and Treatment Rehabilitation  ONSET DATE: Dec 2022  SUBJECTIVE:  SUBJECTIVE STATEMENT: Pt indicated having cervical fusion in April.  Pt indicated having no real pain complaints in last week or so.  Had some general soreness for first day or two out of collar.   Pt indicated chief complaint of difficulty walking/balance.  Pt indicated she hasn't driven since Thanksgiving.  Husband recently passed away in Nov 14, 2022.  Pt indicated having fall around Nov/Dec that led to concerns of balance and having to use walker.    PERTINENT HISTORY:  History of 10/19/21 C3-4 C4-5 C5-6 ACDF with soft collar PMH bracial neuritis, abnormal gait, Degeneration of lumbar, HTN, gouty arthropathy, OA, lumbar laminectomy 2011, Rt TKA  PAIN:  No specific pain at this time.   PRECAUTIONS: None  WEIGHT BEARING RESTRICTIONS No  FALLS:  Has patient fallen in last 6 months? Maybe 1 fall in last 6 months.  Several falls within last year.   LIVING ENVIRONMENT: Lives with:lives alone Lives in: House/apartment Stairs: 3 steps to enter deck, rail on Lt going up.  No stairs in house.  Has following equipment at home: FWW, cane  OCCUPATION: retired   PLOF:  Independent  , cooking/cleaning, shopping/grocery store  [place] stronger, walking independently.    OBJECTIVE:   PATIENT SURVEYS:  01/25/2022 No foto - incorrect set up   COGNITION: 01/25/2022 Overall cognitive status: Within functional limits for tasks assessed   SENSATION: 01/25/2022 not tested today  POSTURE:  01/25/2022 rounded shoulders, forward head, increased thoracic kyphosis, and flexed trunk   PALPATION: 01/25/2022 no specific tenderness noted in screening for balance.    ROM:  ROM Right eval Left eval  Shoulder flexion    Shoulder extension    Shoulder abduction    Shoulder adduction    Shoulder extension    Shoulder internal rotation    Shoulder external rotation    Elbow flexion    Elbow extension                             (Blank rows = not tested)  MMT:  01/25/2022:  Seated resisted static testing hip abduction Rt: strong/painless. Lt moderate strength, painless (held MMT due to difficulty in bed mobility positioning MMT Right eval Left eval  Shoulder flexion    Shoulder extension    Shoulder abduction    Shoulder adduction    Shoulder extension    Shoulder internal rotation    Shoulder external rotation                        Hip flexion 5/5 4/5  Hip extension    Hip abduction    Knee flexion 5/5 5/5  Knee extension  5/5 5/5  Ankle DF 5/5 4 /5   (Blank rows = not tested)  CERVICAL SPECIAL TESTS:  01/25/2022 none performed   FUNCTIONAL TESTS:  01/25/2022   TUG:   71 seconds c FWW   [facility] PT Assessment - 01/25/22 0001       Standardized Balance Assessment   Standardized Balance Assessment Berg Balance Test      Berg Balance Test   Sit to Stand Able to stand using hands after several tries    Standing Unsupported Able to stand 2 minutes with supervision    Sitting with Back Unsupported but Feet Supported on Floor or Stool Able to sit safely and securely 2 minutes    Stand to Sit Uses backs of legs against chair  to control descent    Transfers Able to transfer safely, definite need of hands    Standing Unsupported with Eyes Closed Able to stand 3 seconds    Standing Unsupported with Feet Together Needs help to attain position but able to stand for 30 seconds with feet together    From Standing, Reach Forward with Outstretched Arm Can reach forward >5 cm safely (2")    From Standing Position, Pick up Object from Floor Unable to pick up and needs supervision    From Standing Position, Turn to Look Behind Over each Shoulder Needs assist to keep from losing balance and falling    Turn 360 Degrees Needs assistance while turning    Standing Unsupported, Alternately Place Feet on Step/Stool Needs assistance to keep from falling or unable to try    Standing Unsupported, One Foot in Front Needs help to step but can hold 15 seconds    Standing on One Leg Unable to try or needs assist to prevent fall    Total Score 21                 TODAY'S TREATMENT:  01/25/2022 Therex:  HEP instruction/performance c cues for techniques, handout provided.  Trial set performed of each for comprehension and symptom assessment.  See below for exercise list.   PATIENT EDUCATION:  Education details: HEP, POC, fall risk Person educated: Patient Education method: Explanation, Demonstration, Verbal cues, and Handouts Education comprehension: verbalized understanding, returned demonstration, and verbal cues required   HOME EXERCISE PROGRAM: Access Code: [email or web address] URL: [email or web address] Date: 01/25/2022 Prepared by: Scot Jun  Exercises - Seated March  - 1-2 x daily - 7 x weekly - 1-2 sets - 10 reps - Sit to Stand  - 2-3 x daily - 7 x weekly - 1 sets - 5-10 reps - Seated Long Arc Quad  - 2-3 x daily - 7 x weekly - 1-2 sets - 10 reps - 2 hold  ASSESSMENT:  CLINICAL IMPRESSION: Patient is a 88 y.o. who comes to clinic with complaints of history of cervical pain, s/p fusion in [place]  with general concerns and observed difficulty c mobility, strength and movement coordination deficits that impair their ability to perform usual daily and recreational functional activities without increase difficulty/symptoms at this time.  Patient to benefit from skilled PT services to address impairments and limitations to improve to previous level of function without restriction secondary to condition.    OBJECTIVE IMPAIRMENTS Abnormal gait, decreased activity tolerance, decreased balance, decreased endurance, decreased mobility, difficulty walking, decreased strength, impaired perceived functional ability, impaired flexibility, improper body mechanics, postural dysfunction, and pain.   ACTIVITY LIMITATIONS carrying, lifting, bending, standing, squatting, stairs, transfers, bed mobility, reach over head, and locomotion level  PARTICIPATION LIMITATIONS: meal prep, cleaning, interpersonal relationship, driving, shopping, and community activity  PERSONAL FACTORS   History of 10/19/21 C3-4 C4-5 C5-6 ACDF with soft collar PMH bracial neuritis, abnormal gait, Degeneration of lumbar, HTN, gouty arthropathy, OA, lumbar laminectomy 2011, Rt TKA  are also affecting patient's functional outcome.   REHAB POTENTIAL: Good  CLINICAL DECISION MAKING: Stable/uncomplicated  EVALUATION COMPLEXITY: Low   GOALS: Goals reviewed with patient? Yes  Short term PT Goals (target date for Short term goals are 3 weeks 02/15/2022) Patient will demonstrate independent use of home exercise program to maintain progress from in clinic treatments. Goal status: New   Long term PT goals (target dates for all long term goals are 10 weeks  04/05/2022 )  1. Patient  will demonstrate/report pain at worst less than or equal to 2/10 to facilitate minimal limitation in daily activity secondary to pain symptoms. Goal status: New  2. Patient will demonstrate independent use of home exercise program to facilitate ability to  maintain/progress functional gains from skilled physical therapy services. Goal status: New  3. Patient will demonstrate bilateral hip MMT 5/5 throughout to facilitate improved transfers, ambulation towards independence.  Goal status: New  4.  Patient will demonstrate BERG testing > or = 45 to indicate reduced fall risk.  Goal status: New  5.  Patient will demonstrate TUG c LRAD < or = 20 seconds to indicate reduced fall risk/ improved community ambulation.    Goal status: New  6.  Patient will demonstrate ability to ambulation c LRAD (SPC, independent) community distances > 300 ft.   Goal status: New    PLAN: PT FREQUENCY: 1-2x/week  PT DURATION: 10 weeks  PLANNED INTERVENTIONS: Therapeutic exercises, Therapeutic activity, Neuro Muscular re-education, Balance training, Gait training, Patient/Family education, Joint mobilization, Stair training, DME instructions, Dry Needling, Electrical stimulation, Cryotherapy, Moist heat, Taping, Ultrasound, Ionotophoresis '4mg'$ /ml Dexamethasone, and Manual therapy.  All included unless contraindicated  PLAN FOR NEXT SESSION: Progressive static balance, BLE strengthening, nustep for endurance.    Scot Jun, PT, DPT, OCS, ATC 01/25/22  1:31 PM

## 2022-01-22 DIAGNOSIS — M47817 Spondylosis without myelopathy or radiculopathy, lumbosacral region: Secondary | ICD-10-CM | POA: Diagnosis not present

## 2022-01-25 ENCOUNTER — Encounter: Payer: Self-pay | Admitting: Rehabilitative and Restorative Service Providers"

## 2022-01-25 ENCOUNTER — Ambulatory Visit: Payer: PPO | Admitting: Rehabilitative and Restorative Service Providers"

## 2022-01-25 ENCOUNTER — Other Ambulatory Visit: Payer: Self-pay

## 2022-01-25 DIAGNOSIS — M6281 Muscle weakness (generalized): Secondary | ICD-10-CM | POA: Diagnosis not present

## 2022-01-25 DIAGNOSIS — M542 Cervicalgia: Secondary | ICD-10-CM | POA: Diagnosis not present

## 2022-01-25 DIAGNOSIS — R2689 Other abnormalities of gait and mobility: Secondary | ICD-10-CM

## 2022-02-01 ENCOUNTER — Encounter: Payer: Self-pay | Admitting: Physical Therapy

## 2022-02-01 ENCOUNTER — Ambulatory Visit: Payer: PPO | Admitting: Physical Therapy

## 2022-02-01 DIAGNOSIS — R2689 Other abnormalities of gait and mobility: Secondary | ICD-10-CM | POA: Diagnosis not present

## 2022-02-01 DIAGNOSIS — M6281 Muscle weakness (generalized): Secondary | ICD-10-CM | POA: Diagnosis not present

## 2022-02-01 DIAGNOSIS — R293 Abnormal posture: Secondary | ICD-10-CM | POA: Diagnosis not present

## 2022-02-01 DIAGNOSIS — M542 Cervicalgia: Secondary | ICD-10-CM

## 2022-02-01 NOTE — Therapy (Signed)
OUTPATIENT PHYSICAL THERAPY TREATMENT NOTE   Patient Name: Claudia [name] MRN: [id number] DOB:February 23, 1934, 88 y.o., female Today's Date: 02/01/2022   END OF SESSION:   PT End of Session - 02/01/22 1309     Visit Number 2    Number of Visits 20    Date for PT Re-Evaluation 04/05/22    Authorization Type [employer] $15 copay    PT Start Time 1308    PT Stop Time 1346    PT Time Calculation (min) 38 min    Activity Tolerance Patient tolerated treatment well    Behavior During Therapy WFL for tasks assessed/performed             Past Medical History:  Diagnosis Date   Abnormality of gait    Brachial neuritis or radiculitis NOS    Degeneration of lumbar or lumbosacral intervertebral disc    Dysrhythmia    Essential hypertension, benign    Gouty arthropathy    Lumbago    Obesity    Osteoarthritis    Pure hypercholesterolemia    Unspecified hypothyroidism    Past Surgical History:  Procedure Laterality Date   ABDOMINAL HYSTERECTOMY  03/13/2010   ANTERIOR CERVICAL DECOMP/DISCECTOMY FUSION N/A 10/19/2021   Procedure: C3-4, C4-5, C5-6 ANTERIOR CERVICAL DISCECTOMY FUSION, ALLOGRAFT, PLATE;  Surgeon: [name], MD;  Location: [facility];  Service: Orthopedics;  Laterality: N/A;   BACK SURGERY     LUMBAR LAMINECTOMY  03/13/2010   right knee replacement  03/13/2010   Patient Active Problem List   Diagnosis Date Noted   S/P cervical spinal fusion 11/10/2021   Cervical stricture or stenosis 10/20/2021   Cervical spinal stenosis 10/19/2021   Meningioma ([place]) 08/24/2021   PAF (paroxysmal atrial fibrillation) ([place]) 11/03/2020   Stage 3b chronic kidney disease ([place]) 05/06/2020   Chronic left-sided low back pain without sciatica 12/25/2018   Spinal stenosis in cervical region 05/12/2016   Lower extremity edema 03/04/2016   Therapeutic opioid-induced constipation (OIC) 01/01/2016   Chronic idiopathic constipation 09/11/2013   Lumbosacral spondylosis without myelopathy 04/09/2013    Postlaminectomy syndrome, lumbar region 04/09/2013   Anticoagulation management encounter 02/01/2012   Neuropathy, peripheral 08/04/2011   Pure hypercholesterolemia 08/04/2011   Essential hypertension, benign 08/04/2011   Hypothyroidism 08/04/2011   DJD (degenerative joint disease) of knee 08/04/2011   Gout 08/04/2011      THERAPY DIAG:  Other abnormalities of gait and mobility  Muscle weakness (generalized)  Cervicalgia  Abnormal posture  PCP: Janith Lima. MD   REFERRING PROVIDER: [name], MD   REFERRING DIAG: Z98.1 (ICD-10-CM) - S/P cervical spinal fusion    Rationale for Evaluation and Treatment Rehabilitation   ONSET DATE: Dec 2022   SUBJECTIVE:  SUBJECTIVE STATEMENT: She denies any pain, but complains of weakness left sided more than Right.   PERTINENT HISTORY:  History of 10/19/21 C3-4 C4-5 C5-6 ACDF with soft collar PMH bracial neuritis, abnormal gait, Degeneration of lumbar, HTN, gouty arthropathy, OA, lumbar laminectomy 2011, Rt TKA   PAIN:  No specific pain at this time.    PRECAUTIONS: None   WEIGHT BEARING RESTRICTIONS No   FALLS:  Has patient fallen in last 6 months? Maybe 1 fall in last 6 months.  Several falls within last year.    LIVING ENVIRONMENT: Lives with:lives alone Lives in: House/apartment Stairs: 3 steps to enter deck, rail on Lt going up.  No stairs in house.  Has following equipment at home: FWW, cane   OCCUPATION: retired    PLOF: Independent  , cooking/cleaning, shopping/grocery store   [place] stronger, walking independently.     OBJECTIVE:    PATIENT SURVEYS:  01/25/2022 No foto - incorrect set up     COGNITION: 01/25/2022 Overall cognitive status: Within functional limits for tasks assessed      SENSATION: 01/25/2022 not tested today   POSTURE:  01/25/2022 rounded shoulders, forward head, increased thoracic kyphosis, and flexed trunk    PALPATION: 01/25/2022 no specific tenderness noted in screening for balance.      ROM:   ROM Right eval Left eval  Shoulder flexion      Shoulder extension      Shoulder abduction      Shoulder adduction      Shoulder extension      Shoulder internal rotation      Shoulder external rotation      Elbow flexion      Elbow extension                                                 (Blank rows = not tested)   MMT:            01/25/2022:  Seated resisted static testing hip abduction Rt: strong/painless. Lt moderate strength, painless (held MMT due to difficulty in bed mobility positioning MMT Right eval Left eval  Shoulder flexion      Shoulder extension      Shoulder abduction      Shoulder adduction      Shoulder extension      Shoulder internal rotation      Shoulder external rotation                                         Hip flexion 5/5 4/5  Hip extension      Hip abduction      Knee flexion 5/5 5/5  Knee extension  5/5 5/5  Ankle DF 5/5 4 /5   (Blank rows = not tested)   CERVICAL SPECIAL TESTS:  01/25/2022 none performed     FUNCTIONAL TESTS:  01/25/2022        TUG:   71 seconds c FWW     [facility] PT Assessment - 01/25/22 0001                Standardized Balance Assessment    Standardized Balance Assessment Berg Balance Test          [facility] Balance Test  Sit to Stand Able to stand using hands after several tries     Standing Unsupported Able to stand 2 minutes with supervision     Sitting with Back Unsupported but Feet Supported on Floor or Stool Able to sit safely and securely 2 minutes     Stand to Sit Uses backs of legs against chair to control descent     Transfers Able to transfer safely, definite need of hands     Standing Unsupported with Eyes Closed Able to stand 3 seconds     Standing Unsupported  with Feet Together Needs help to attain position but able to stand for 30 seconds with feet together     From Standing, Reach Forward with Outstretched Arm Can reach forward >5 cm safely (2")     From Standing Position, Pick up Object from Floor Unable to pick up and needs supervision     From Standing Position, Turn to Look Behind Over each Shoulder Needs assist to keep from losing balance and falling     Turn 360 Degrees Needs assistance while turning     Standing Unsupported, Alternately Place Feet on Step/Stool Needs assistance to keep from falling or unable to try     Standing Unsupported, One Foot in Front Needs help to step but can hold 15 seconds     Standing on One Leg Unable to try or needs assist to prevent fall     Total Score 21                           TODAY'S TREATMENT:  02/01/22 -Seated LAQ X 15 bilat -Seated marches X15 bilat -Sit to stand 2X5 with UE support -Standing hip abductions with bilat UE support X10 -modified tandem balance in RW 20 sec X 2 bilat -Balance with feet apart in RW with head turns X10 bilat -Nu step L4 X 6 min UE/LE  01/25/2022 Therex:             HEP instruction/performance c cues for techniques, handout provided.  Trial set performed of each for comprehension and symptom assessment.  See below for exercise list.     PATIENT EDUCATION:  Education details: HEP, POC, fall risk Person educated: Patient Education method: Explanation, Demonstration, Verbal cues, and Handouts Education comprehension: verbalized understanding, returned demonstration, and verbal cues required     HOME EXERCISE PROGRAM: Access Code: [email or web address] URL: [email or web address] Date: 01/25/2022 Prepared by: Scot Jun   Exercises - Seated March  - 1-2 x daily - 7 x weekly - 1-2 sets - 10 reps - Sit to Stand  - 2-3 x daily - 7 x weekly - 1 sets - 5-10 reps - Seated Long Arc Quad  - 2-3 x daily - 7 x weekly - 1-2 sets - 10 reps - 2 hold    ASSESSMENT:   CLINICAL IMPRESSION: She shows good understanding of initial HEP. I progressed strength and balance program today to her tolerance level and we will monitor for soreness from this.      OBJECTIVE IMPAIRMENTS Abnormal gait, decreased activity tolerance, decreased balance, decreased endurance, decreased mobility, difficulty walking, decreased strength, impaired perceived functional ability, impaired flexibility, improper body mechanics, postural dysfunction, and pain.    ACTIVITY LIMITATIONS carrying, lifting, bending, standing, squatting, stairs, transfers, bed mobility, reach over head, and locomotion level   PARTICIPATION LIMITATIONS: meal prep, cleaning, interpersonal relationship, driving, shopping, and community activity   PERSONAL FACTORS   History of 10/19/21 C3-4  C4-5 C5-6 ACDF with soft collar PMH bracial neuritis, abnormal gait, Degeneration of lumbar, HTN, gouty arthropathy, OA, lumbar laminectomy 2011, Rt TKA  are also affecting patient's functional outcome.    REHAB POTENTIAL: Good   CLINICAL DECISION MAKING: Stable/uncomplicated   EVALUATION COMPLEXITY: Low     GOALS: Goals reviewed with patient? Yes   Short term PT Goals (target date for Short term goals are 3 weeks 02/15/2022) Patient will demonstrate independent use of home exercise program to maintain progress from in clinic treatments. Goal status: New   Long term PT goals (target dates for all long term goals are 10 weeks  04/05/2022 )   1. Patient will demonstrate/report pain at worst less than or equal to 2/10 to facilitate minimal limitation in daily activity secondary to pain symptoms. Goal status: New   2. Patient will demonstrate independent use of home exercise program to facilitate ability to maintain/progress functional gains from skilled physical therapy services. Goal status: New   3. Patient will demonstrate bilateral hip MMT 5/5 throughout to facilitate improved transfers, ambulation  towards independence.  Goal status: New   4.  Patient will demonstrate BERG testing > or = 45 to indicate reduced fall risk.  Goal status: New   5.  Patient will demonstrate TUG c LRAD < or = 20 seconds to indicate reduced fall risk/ improved community ambulation.    Goal status: New   6.  Patient will demonstrate ability to ambulation c LRAD (SPC, independent) community distances > 300 ft.   Goal status: New       PLAN: PT FREQUENCY: 1-2x/week   PT DURATION: 10 weeks   PLANNED INTERVENTIONS: Therapeutic exercises, Therapeutic activity, Neuro Muscular re-education, Balance training, Gait training, Patient/Family education, Joint mobilization, Stair training, DME instructions, Dry Needling, Electrical stimulation, Cryotherapy, Moist heat, Taping, Ultrasound, Ionotophoresis '4mg'$ /ml Dexamethasone, and Manual therapy.  All included unless contraindicated   PLAN FOR NEXT SESSION: Progressive static balance, BLE strengthening, nustep for endurance.    Debbe Odea, PT,DPT 02/01/2022, 1:14 PM

## 2022-02-03 ENCOUNTER — Telehealth: Payer: Self-pay | Admitting: Orthopaedic Surgery

## 2022-02-03 NOTE — Telephone Encounter (Signed)
Placard at my desk for Dr. Lorin Mercy to sign on Friday.

## 2022-02-03 NOTE — Telephone Encounter (Signed)
Patient came in today to drop off handicap form and also a note for Dr. Lorin Mercy, please advise letter is in bin on yates side at front desk

## 2022-02-05 NOTE — Telephone Encounter (Signed)
I called patient and advised, handicap tag paperwork at front for pick up. She states that Denyse Amass will be here to get it after work.

## 2022-02-10 ENCOUNTER — Ambulatory Visit: Payer: PPO | Admitting: Rehabilitative and Restorative Service Providers"

## 2022-02-10 ENCOUNTER — Encounter: Payer: Self-pay | Admitting: Rehabilitative and Restorative Service Providers"

## 2022-02-10 DIAGNOSIS — G8929 Other chronic pain: Secondary | ICD-10-CM | POA: Diagnosis not present

## 2022-02-10 DIAGNOSIS — M545 Low back pain, unspecified: Secondary | ICD-10-CM | POA: Diagnosis not present

## 2022-02-10 DIAGNOSIS — M6281 Muscle weakness (generalized): Secondary | ICD-10-CM

## 2022-02-10 DIAGNOSIS — R293 Abnormal posture: Secondary | ICD-10-CM

## 2022-02-10 DIAGNOSIS — R2689 Other abnormalities of gait and mobility: Secondary | ICD-10-CM

## 2022-02-10 DIAGNOSIS — M542 Cervicalgia: Secondary | ICD-10-CM

## 2022-02-10 NOTE — Therapy (Signed)
OUTPATIENT PHYSICAL THERAPY TREATMENT NOTE   Patient Name: Claudia [name] MRN: [id number] DOB:1933/08/12, 88 y.o., female Today's Date: 02/10/2022   END OF SESSION:   PT End of Session - 02/10/22 1606     Visit Number 3    Number of Visits 20    Date for PT Re-Evaluation 04/05/22    Authorization Type [employer] $15 copay    PT Start Time 1403    PT Stop Time 1443    PT Time Calculation (min) 40 min    Activity Tolerance Patient tolerated treatment well    Behavior During Therapy WFL for tasks assessed/performed              Past Medical History:  Diagnosis Date   Abnormality of gait    Brachial neuritis or radiculitis NOS    Degeneration of lumbar or lumbosacral intervertebral disc    Dysrhythmia    Essential hypertension, benign    Gouty arthropathy    Lumbago    Obesity    Osteoarthritis    Pure hypercholesterolemia    Unspecified hypothyroidism    Past Surgical History:  Procedure Laterality Date   ABDOMINAL HYSTERECTOMY  03/13/2010   ANTERIOR CERVICAL DECOMP/DISCECTOMY FUSION N/A 10/19/2021   Procedure: C3-4, C4-5, C5-6 ANTERIOR CERVICAL DISCECTOMY FUSION, ALLOGRAFT, PLATE;  Surgeon: [name], MD;  Location: [facility];  Service: Orthopedics;  Laterality: N/A;   BACK SURGERY     LUMBAR LAMINECTOMY  03/13/2010   right knee replacement  03/13/2010   Patient Active Problem List   Diagnosis Date Noted   S/P cervical spinal fusion 11/10/2021   Cervical stricture or stenosis 10/20/2021   Cervical spinal stenosis 10/19/2021   Meningioma ([place]) 08/24/2021   PAF (paroxysmal atrial fibrillation) ([place]) 11/03/2020   Stage 3b chronic kidney disease ([place]) 05/06/2020   Chronic left-sided low back pain without sciatica 12/25/2018   Spinal stenosis in cervical region 05/12/2016   Lower extremity edema 03/04/2016   Therapeutic opioid-induced constipation (OIC) 01/01/2016   Chronic idiopathic constipation 09/11/2013   Lumbosacral spondylosis without myelopathy  04/09/2013   Postlaminectomy syndrome, lumbar region 04/09/2013   Anticoagulation management encounter 02/01/2012   Neuropathy, peripheral 08/04/2011   Pure hypercholesterolemia 08/04/2011   Essential hypertension, benign 08/04/2011   Hypothyroidism 08/04/2011   DJD (degenerative joint disease) of knee 08/04/2011   Gout 08/04/2011      THERAPY DIAG:  Other abnormalities of gait and mobility  Muscle weakness (generalized)  Cervicalgia  Abnormal posture  Chronic bilateral low back pain without sciatica  PCP: Janith Lima. MD   REFERRING PROVIDER: [name], MD   REFERRING DIAG: Z98.1 (ICD-10-CM) - S/P cervical spinal fusion    Rationale for Evaluation and Treatment Rehabilitation   ONSET DATE: Dec 2022   SUBJECTIVE:  SUBJECTIVE STATEMENT: [name] notes constant B hand tingling.  She has been walking more with her walker and doing her exercises at home.  She would like to be able to get rid of the walker and walk with better posture, balance and endurance.   PERTINENT HISTORY:  History of 10/19/21 C3-4 C4-5 C5-6 ACDF with soft collar PMH bracial neuritis, abnormal gait, Degeneration of lumbar, HTN, gouty arthropathy, OA, lumbar laminectomy 2011, Rt TKA   PAIN:  No specific pain at this time.    PRECAUTIONS: None   WEIGHT BEARING RESTRICTIONS No   FALLS:  Has patient fallen in last 6 months? Maybe 1 fall in last 6 months.  Several falls within last year.    LIVING ENVIRONMENT: Lives with:lives alone Lives in: House/apartment Stairs: 3 steps to enter deck, rail on Lt going up.  No stairs in house.  Has following equipment at home: FWW, cane   OCCUPATION: retired    PLOF: Independent  , cooking/cleaning, shopping/grocery store   [place]  stronger, walking independently.     OBJECTIVE:    PATIENT SURVEYS:  01/25/2022 No foto - incorrect set up     COGNITION: 01/25/2022 Overall cognitive status: Within functional limits for tasks assessed     SENSATION: 01/25/2022 not tested today   POSTURE:  01/25/2022 rounded shoulders, forward head, increased thoracic kyphosis, and flexed trunk    PALPATION: 01/25/2022 no specific tenderness noted in screening for balance.      ROM:   ROM Right eval Left eval  Shoulder flexion      Shoulder extension      Shoulder abduction      Shoulder adduction      Shoulder extension      Shoulder internal rotation      Shoulder external rotation      Elbow flexion      Elbow extension                                                 (Blank rows = not tested)   MMT:            01/25/2022:  Seated resisted static testing hip abduction Rt: strong/painless. Lt moderate strength, painless (held MMT due to difficulty in bed mobility positioning MMT Right eval Left eval  Shoulder flexion      Shoulder extension      Shoulder abduction      Shoulder adduction      Shoulder extension      Shoulder internal rotation      Shoulder external rotation                                         Hip flexion 5/5 4/5  Hip extension      Hip abduction      Knee flexion 5/5 5/5  Knee extension  5/5 5/5  Ankle DF 5/5 4 /5   (Blank rows = not tested)   CERVICAL SPECIAL TESTS:  01/25/2022 none performed     FUNCTIONAL TESTS:  01/25/2022        TUG:   71 seconds c FWW     [facility] PT Assessment - 01/25/22 0001  Standardized Balance Assessment    Standardized Balance Assessment Berg Balance Test          Berg Balance Test    Sit to Stand Able to stand using hands after several tries     Standing Unsupported Able to stand 2 minutes with supervision     Sitting with Back Unsupported but Feet Supported on Floor or Stool Able to sit safely and securely 2 minutes     Stand  to Sit Uses backs of legs against chair to control descent     Transfers Able to transfer safely, definite need of hands     Standing Unsupported with Eyes Closed Able to stand 3 seconds     Standing Unsupported with Feet Together Needs help to attain position but able to stand for 30 seconds with feet together     From Standing, Reach Forward with Outstretched Arm Can reach forward >5 cm safely (2")     From Standing Position, Pick up Object from Floor Unable to pick up and needs supervision     From Standing Position, Turn to Look Behind Over each Shoulder Needs assist to keep from losing balance and falling     Turn 360 Degrees Needs assistance while turning     Standing Unsupported, Alternately Place Feet on Step/Stool Needs assistance to keep from falling or unable to try     Standing Unsupported, One Foot in Front Needs help to step but can hold 15 seconds     Standing on One Leg Unable to try or needs assist to prevent fall     Total Score 21                           TODAY'S TREATMENT:  02/10/22 Shoulder blade pinches 5X 5 seconds Standing hip abduction in parallel bars 10X 3 seconds B Alternating hip hike in walker 10X 3 seconds  Functional Activities for sit to stand, stairs, gait: Sit to stand with perfect posture at the top and slow eccentrics 2 sets of 5  Dynamic gait with postural cues in the parallel bars 4 laps  Neuromuscular re-education: Tandem static balance R/L and L/R with cues for step-strategy 5X B; Feet together balance with no hands working on step-strategy 5X   02/01/22 -Seated LAQ X 15 bilat -Seated marches X15 bilat -Sit to stand 2X5 with UE support -Standing hip abductions with bilat UE support X10 -modified tandem balance in RW 20 sec X 2 bilat -Balance with feet apart in RW with head turns X10 bilat -Nu step L4 X 6 min UE/LE   01/25/2022 Therex:             HEP instruction/performance c cues for techniques, handout provided.  Trial set  performed of each for comprehension and symptom assessment.  See below for exercise list.     PATIENT EDUCATION:  Education details: HEP, POC, fall risk Person educated: Patient Education method: Explanation, Demonstration, Verbal cues, and Handouts Education comprehension: verbalized understanding, returned demonstration, and verbal cues required     HOME EXERCISE PROGRAM: Access Code: [email or web address] URL: [email or web address] Date: 01/25/2022 Prepared by: Scot Jun   Exercises - Seated March  - 1-2 x daily - 7 x weekly - 1-2 sets - 10 reps - Sit to Stand  - 2-3 x daily - 7 x weekly - 1 sets - 5-10 reps - Seated Long Arc Quad  - 2-3 x daily - 7 x weekly - 1-2 sets -  10 reps - 2 hold   ASSESSMENT:   CLINICAL IMPRESSION: Celesta will benefit from supervised PT to improve her posture, leg strength, gait quality, endurance, reduce her falls risk and improve independence.  We spent a lot of time working on step strategy today as she is very quick to grab with her hands with a loss of balance.  Continue general postural and leg strengthening along with balance and functional activities to meet LTGs.     OBJECTIVE IMPAIRMENTS Abnormal gait, decreased activity tolerance, decreased balance, decreased endurance, decreased mobility, difficulty walking, decreased strength, impaired perceived functional ability, impaired flexibility, improper body mechanics, postural dysfunction, and pain.    ACTIVITY LIMITATIONS carrying, lifting, bending, standing, squatting, stairs, transfers, bed mobility, reach over head, and locomotion level   PARTICIPATION LIMITATIONS: meal prep, cleaning, interpersonal relationship, driving, shopping, and community activity   PERSONAL FACTORS   History of 10/19/21 C3-4 C4-5 C5-6 ACDF with soft collar PMH bracial neuritis, abnormal gait, Degeneration of lumbar, HTN, gouty arthropathy, OA, lumbar laminectomy 2011, Rt TKA  are also affecting patient's  functional outcome.    REHAB POTENTIAL: Good   CLINICAL DECISION MAKING: Stable/uncomplicated   EVALUATION COMPLEXITY: Low     GOALS: Goals reviewed with patient? Yes   Short term PT Goals (target date for Short term goals are 3 weeks 02/15/2022) Patient will demonstrate independent use of home exercise program to maintain progress from in clinic treatments. Goal status: On Going 02/10/2022   Long term PT goals (target dates for all long term goals are 10 weeks  04/05/2022 )   1. Patient will demonstrate/report pain at worst less than or equal to 2/10 to facilitate minimal limitation in daily activity secondary to pain symptoms. Goal status: New   2. Patient will demonstrate independent use of home exercise program to facilitate ability to maintain/progress functional gains from skilled physical therapy services. Goal status: New   3. Patient will demonstrate bilateral hip MMT 5/5 throughout to facilitate improved transfers, ambulation towards independence.  Goal status: New   4.  Patient will demonstrate BERG testing > or = 45 to indicate reduced fall risk.  Goal status: New   5.  Patient will demonstrate TUG c LRAD < or = 20 seconds to indicate reduced fall risk/ improved community ambulation.    Goal status: New   6.  Patient will demonstrate ability to ambulation c LRAD (SPC, independent) community distances > 300 ft.   Goal status: New       PLAN: PT FREQUENCY: 1-2x/week   PT DURATION: 10 weeks   PLANNED INTERVENTIONS: Therapeutic exercises, Therapeutic activity, Neuro Muscular re-education, Balance training, Gait training, Patient/Family education, Joint mobilization, Stair training, DME instructions, Dry Needling, Electrical stimulation, Cryotherapy, Moist heat, Taping, Ultrasound, Ionotophoresis '4mg'$ /ml Dexamethasone, and Manual therapy.  All included unless contraindicated   PLAN FOR NEXT SESSION: Progressive static balance, BLE strengthening, nustep for endurance.     Farley Ly, PT, MPT 02/10/2022, 4:57 PM

## 2022-02-15 ENCOUNTER — Encounter: Payer: Self-pay | Admitting: Physical Therapy

## 2022-02-15 ENCOUNTER — Encounter: Payer: PPO | Admitting: Physical Therapy

## 2022-02-15 ENCOUNTER — Ambulatory Visit (INDEPENDENT_AMBULATORY_CARE_PROVIDER_SITE_OTHER): Payer: PPO | Admitting: Physical Therapy

## 2022-02-15 ENCOUNTER — Ambulatory Visit (INDEPENDENT_AMBULATORY_CARE_PROVIDER_SITE_OTHER): Payer: PPO | Admitting: Internal Medicine

## 2022-02-15 ENCOUNTER — Encounter: Payer: Self-pay | Admitting: Internal Medicine

## 2022-02-15 VITALS — BP 156/70 | HR 65 | Temp 98.1°F | Ht 70.0 in | Wt 159.0 lb

## 2022-02-15 DIAGNOSIS — M961 Postlaminectomy syndrome, not elsewhere classified: Secondary | ICD-10-CM | POA: Diagnosis not present

## 2022-02-15 DIAGNOSIS — M545 Low back pain, unspecified: Secondary | ICD-10-CM | POA: Diagnosis not present

## 2022-02-15 DIAGNOSIS — E538 Deficiency of other specified B group vitamins: Secondary | ICD-10-CM | POA: Diagnosis not present

## 2022-02-15 DIAGNOSIS — G63 Polyneuropathy in diseases classified elsewhere: Secondary | ICD-10-CM

## 2022-02-15 DIAGNOSIS — R2689 Other abnormalities of gait and mobility: Secondary | ICD-10-CM | POA: Diagnosis not present

## 2022-02-15 DIAGNOSIS — I10 Essential (primary) hypertension: Secondary | ICD-10-CM

## 2022-02-15 DIAGNOSIS — M6281 Muscle weakness (generalized): Secondary | ICD-10-CM

## 2022-02-15 DIAGNOSIS — D539 Nutritional anemia, unspecified: Secondary | ICD-10-CM | POA: Diagnosis not present

## 2022-02-15 DIAGNOSIS — M47817 Spondylosis without myelopathy or radiculopathy, lumbosacral region: Secondary | ICD-10-CM

## 2022-02-15 DIAGNOSIS — M542 Cervicalgia: Secondary | ICD-10-CM

## 2022-02-15 DIAGNOSIS — N1832 Chronic kidney disease, stage 3b: Secondary | ICD-10-CM | POA: Diagnosis not present

## 2022-02-15 DIAGNOSIS — M170 Bilateral primary osteoarthritis of knee: Secondary | ICD-10-CM | POA: Diagnosis not present

## 2022-02-15 DIAGNOSIS — R293 Abnormal posture: Secondary | ICD-10-CM

## 2022-02-15 DIAGNOSIS — G8929 Other chronic pain: Secondary | ICD-10-CM

## 2022-02-15 LAB — BASIC METABOLIC PANEL
BUN: 30 mg/dL — ABNORMAL HIGH (ref 6–23)
CO2: 30 mEq/L (ref 19–32)
Calcium: 10.6 mg/dL — ABNORMAL HIGH (ref 8.4–10.5)
Chloride: 100 mEq/L (ref 96–112)
Creatinine, Ser: 1.22 mg/dL — ABNORMAL HIGH (ref 0.40–1.20)
GFR: 40.33 mL/min — ABNORMAL LOW (ref >60.00)
Glucose, Bld: 100 mg/dL — ABNORMAL HIGH (ref 70–99)
Potassium: 4.6 mEq/L (ref 3.5–5.1)
Sodium: 142 mEq/L (ref 135–145)

## 2022-02-15 LAB — CBC WITH DIFFERENTIAL/PLATELET
Basophils Absolute: 0 K/uL (ref 0.0–0.1)
Basophils Relative: 0.7 % (ref 0.0–3.0)
Eosinophils Absolute: 0.1 K/uL (ref 0.0–0.7)
Eosinophils Relative: 2.4 % (ref 0.0–5.0)
HCT: 36.5 % (ref 36.0–46.0)
Hemoglobin: 12 g/dL (ref 12.0–15.0)
Lymphocytes Relative: 36.3 % (ref 12.0–46.0)
Lymphs Abs: 1.7 K/uL (ref 0.7–4.0)
MCHC: 32.8 g/dL (ref 30.0–36.0)
MCV: 88.4 fl (ref 78.0–100.0)
Monocytes Absolute: 0.5 K/uL (ref 0.1–1.0)
Monocytes Relative: 10.5 % (ref 3.0–12.0)
Neutro Abs: 2.3 K/uL (ref 1.4–7.7)
Neutrophils Relative %: 50.1 % (ref 43.0–77.0)
Platelets: 208 K/uL (ref 150.0–400.0)
RBC: 4.13 Mil/uL (ref 3.87–5.11)
RDW: 13.8 % (ref 11.5–15.5)
WBC: 4.6 K/uL (ref 4.0–10.5)

## 2022-02-15 LAB — IBC + FERRITIN
Ferritin: 174.6 ng/mL (ref 10.0–291.0)
Iron: 110 ug/dL (ref 42–145)
Saturation Ratios: 32.6 % (ref 20.0–50.0)
TIBC: 337.4 ug/dL (ref 250.0–450.0)
Transferrin: 241 mg/dL (ref 212.0–360.0)

## 2022-02-15 LAB — FOLATE: Folate: 9.8 ng/mL (ref >5.9)

## 2022-02-15 LAB — VITAMIN B12: Vitamin B-12: 183 pg/mL — ABNORMAL LOW (ref 211–911)

## 2022-02-15 NOTE — Progress Notes (Unsigned)
Subjective:  Patient ID: Claudia [name], female    DOB: 04/22/1934  Age: 88 y.o. MRN: [id number]  CC: Anemia and Hypertension   HPI Claudia [name] presents for f/up -  She recently underwent C-spine fusion but continues to complain of numbness and tingling in her upper extremities.   Outpatient Medications Prior to Visit  Medication Sig Dispense Refill   diltiazem (CARDIZEM) 30 MG tablet TAKE ONE TABLET EVERY 4 HOURS AS NEEDED FOR afib heart rate greater THAN 100 aslong as blood pressure less THAN 100 90 tablet 1   linaclotide (LINZESS) 290 MCG CAPS capsule Take 1 capsule (290 mcg total) by mouth daily before breakfast. 90 capsule 1   rivaroxaban (XARELTO) 20 MG TABS tablet Take 1 tablet (20 mg total) by mouth daily with supper. 90 tablet 1   torsemide (DEMADEX) 20 MG tablet TAKE ONE TABLET BY MOUTH ONCE DAILY 90 tablet 1   vitamin C (ASCORBIC ACID) 500 MG tablet Take 500 mg by mouth daily.     vitamin E 200 UNIT capsule Take 200 Units by mouth daily.     HYDROcodone-acetaminophen (NORCO/VICODIN) 5-325 MG tablet TAKE ONE TABLET BY MOUTH EVERY 8 HOURS BEFORE MEALS 90 tablet 0   No facility-administered medications prior to visit.    ROS Review of Systems  Constitutional:  Negative for diaphoresis and fatigue.  HENT: Negative.    Eyes: Negative.   Respiratory:  Negative for cough, chest tightness, shortness of breath and wheezing.   Cardiovascular:  Negative for chest pain, palpitations and leg swelling.  Gastrointestinal:  Negative for abdominal pain, constipation and diarrhea.  Endocrine: Negative.   Genitourinary: Negative.  Negative for difficulty urinating.  Musculoskeletal:  Positive for arthralgias, back pain and neck pain. Negative for joint swelling and myalgias.  Skin: Negative.   Neurological:  Positive for numbness. Negative for dizziness and weakness.  Hematological:  Negative for adenopathy. Does not bruise/bleed easily.  Psychiatric/Behavioral: Negative.       Objective:  BP (!) 156/70 (BP Location: Left Arm, Patient Position: Sitting, Cuff Size: Large)   Pulse 65   Temp 98.1 F (36.7 C) (Oral)   Ht '5\' 10"'$  (1.778 m)   Wt 159 lb (72.1 kg)   SpO2 97%   BMI 22.81 kg/m   BP Readings from Last 3 Encounters:  02/15/22 (!) 156/70  01/05/22 (!) 164/71  12/01/21 136/67    Wt Readings from Last 3 Encounters:  02/15/22 159 lb (72.1 kg)  01/05/22 177 lb (80.3 kg)  12/08/21 158 lb (71.7 kg)    Physical Exam HENT:     Mouth/Throat:     Mouth: Mucous membranes are moist.  Eyes:     General: No scleral icterus.    Conjunctiva/sclera: Conjunctivae normal.  Cardiovascular:     Rate and Rhythm: Normal rate and regular rhythm.  Pulmonary:     Effort: Pulmonary effort is normal.     Breath sounds: No stridor. No wheezing, rhonchi or rales.  Abdominal:     General: Abdomen is flat.     Palpations: There is no mass.     Tenderness: There is no abdominal tenderness. There is no guarding.     Hernia: No hernia is present.  Musculoskeletal:        General: Normal range of motion.     Cervical back: Neck supple.     Right lower leg: No edema.     Left lower leg: No edema.  Skin:    General: Skin is  warm.  Neurological:     General: No focal deficit present.     Mental Status: She is alert. Mental status is at baseline.     Lab Results  Component Value Date   WBC 4.6 02/15/2022   HGB 12.0 02/15/2022   HCT 36.5 02/15/2022   PLT 208.0 02/15/2022   GLUCOSE 100 (H) 02/15/2022   CHOL 148 05/05/2021   TRIG 85.0 05/05/2021   HDL 54.40 05/05/2021   LDLDIRECT 158.9 09/05/2012   LDLCALC 77 05/05/2021   ALT 11 10/19/2021   AST 14 (L) 10/19/2021   NA 142 02/15/2022   K 4.6 02/15/2022   CL 100 02/15/2022   CREATININE 1.22 (H) 02/15/2022   BUN 30 (H) 02/15/2022   CO2 30 02/15/2022   TSH 4.92 05/05/2021   INR 1.00 04/04/2017   HGBA1C 5.5 11/03/2020    DG Cervical Spine 2 or 3 views  Result Date: 10/19/2021 CLINICAL DATA:  ACDF  EXAM: CERVICAL SPINE - 2-3 VIEW COMPARISON:  Radiograph 07/28/2021 FINDINGS: Intraoperative images during C3-C4, C4-C5, and C5-C6 ACDF. Intact hardware without evidence of immediate complication. IMPRESSION: Intraoperative images during C3-C6 ACDF. No evidence of immediate complication. Electronically Signed   By: Maurine Simmering M.D.   On: 10/19/2021 11:27   DG C-Arm 1-60 Min-No Report  Result Date: 10/19/2021 Fluoroscopy was utilized by the requesting physician.  No radiographic interpretation.   DG C-Arm 1-60 Min-No Report  Result Date: 10/19/2021 Fluoroscopy was utilized by the requesting physician.  No radiographic interpretation.   DG C-Arm 1-60 Min-No Report  Result Date: 10/19/2021 Fluoroscopy was utilized by the requesting physician.  No radiographic interpretation.    Assessment & Plan:   Claudia [name] was seen today for anemia and hypertension.  Diagnoses and all orders for this visit:  Essential hypertension, benign- Her blood pressure is well controlled. -     Basic metabolic panel; Future -     Basic metabolic panel  Stage 3b chronic kidney disease ([place])- Her renal function is stable. -     Basic metabolic panel; Future -     Basic metabolic panel  Deficiency anemia- Will evaluate for vitamin deficiencies. -     Vitamin B12; Future -     IBC + Ferritin; Future -     CBC with Differential/Platelet; Future -     Folate; Future -     Zinc; Future -     Vitamin B1; Future -     Vitamin B1 -     Zinc -     Folate -     CBC with Differential/Platelet -     IBC + Ferritin -     Vitamin B12  Vitamin B12 deficiency neuropathy ([place])- I have asked her to start parenteral B12 replacement therapy.  Hypercalcemia  Primary osteoarthritis of both knees -     HYDROcodone-acetaminophen (NORCO/VICODIN) 5-325 MG tablet; Take 1 tablet by mouth every 6 (six) hours as needed for moderate pain.  Postlaminectomy syndrome, lumbar region -     HYDROcodone-acetaminophen (NORCO/VICODIN)  5-325 MG tablet; Take 1 tablet by mouth every 6 (six) hours as needed for moderate pain.  Lumbosacral spondylosis without myelopathy -     HYDROcodone-acetaminophen (NORCO/VICODIN) 5-325 MG tablet; Take 1 tablet by mouth every 6 (six) hours as needed for moderate pain.   I have changed Claudia [name] HYDROcodone-acetaminophen. I am also having her maintain her vitamin E, ascorbic acid, diltiazem, linaclotide, rivaroxaban, and torsemide.  Meds ordered this encounter  Medications  HYDROcodone-acetaminophen (NORCO/VICODIN) 5-325 MG tablet    Sig: Take 1 tablet by mouth every 6 (six) hours as needed for moderate pain.    Dispense:  90 tablet    Refill:  0     Follow-up: Return in about 3 months (around 05/18/2022).  [name], MD

## 2022-02-15 NOTE — Therapy (Addendum)
OUTPATIENT PHYSICAL THERAPY TREATMENT NOTE   Patient Name: Claudia [name] MRN: [id number] DOB:[birth year], 88 y.o., female Today's Date: 02/15/2022   END OF SESSION:   PT End of Session - 02/15/22 1057     Visit Number 4    Number of Visits 20    Date for PT Re-Evaluation 04/05/22    Authorization Type [employer] $15 copay    PT Start Time 1059    PT Stop Time 1139    PT Time Calculation (min) 40 min    Activity Tolerance Patient tolerated treatment well    Behavior During Therapy WFL for tasks assessed/performed              Past Medical History:  Diagnosis Date   Abnormality of gait    Brachial neuritis or radiculitis NOS    Degeneration of lumbar or lumbosacral intervertebral disc    Dysrhythmia    Essential hypertension, benign    Gouty arthropathy    Lumbago    Obesity    Osteoarthritis    Pure hypercholesterolemia    Unspecified hypothyroidism    Past Surgical History:  Procedure Laterality Date   ABDOMINAL HYSTERECTOMY  03/13/2010   ANTERIOR CERVICAL DECOMP/DISCECTOMY FUSION N/A 10/19/2021   Procedure: C3-4, C4-5, C5-6 ANTERIOR CERVICAL DISCECTOMY FUSION, ALLOGRAFT, PLATE;  Surgeon: Claudia Killings, MD;  Location: [facility];  Service: Orthopedics;  Laterality: N/A;   BACK SURGERY     LUMBAR LAMINECTOMY  03/13/2010   right knee replacement  03/13/2010   Patient Active Problem List   Diagnosis Date Noted   S/P cervical spinal fusion 11/10/2021   Cervical stricture or stenosis 10/20/2021   Cervical spinal stenosis 10/19/2021   Meningioma ([place]) 08/24/2021   PAF (paroxysmal atrial fibrillation) ([place]) 11/03/2020   Stage 3b chronic kidney disease ([place]) 05/06/2020   Chronic left-sided low back pain without sciatica 12/25/2018   Spinal stenosis in cervical region 05/12/2016   Lower extremity edema 03/04/2016   Therapeutic opioid-induced constipation ([facility]) 01/01/2016   Chronic idiopathic constipation 09/11/2013   Lumbosacral spondylosis without myelopathy  04/09/2013   Postlaminectomy syndrome, lumbar region 04/09/2013   Anticoagulation management encounter 02/01/2012   Neuropathy, peripheral 08/04/2011   Pure hypercholesterolemia 08/04/2011   Essential hypertension, benign 08/04/2011   Hypothyroidism 08/04/2011   DJD (degenerative joint disease) of knee 08/04/2011   Gout 08/04/2011      THERAPY DIAG:  Other abnormalities of gait and mobility  Muscle weakness (generalized)  Cervicalgia  Abnormal posture  Chronic bilateral low back pain without sciatica  PCP: Claudia [name]. MD   REFERRING PROVIDER: [name], MD   REFERRING DIAG: Z98.1 (ICD-10-CM) - S/P cervical spinal fusion    Rationale for Evaluation and Treatment Rehabilitation   ONSET DATE: Dec 2022   SUBJECTIVE:  SUBJECTIVE STATEMENT: Claudia [name] notes not too much pain today or soreness   PERTINENT HISTORY:  History of 10/19/21 C3-4 C4-5 C5-6 ACDF with soft collar PMH bracial neuritis, abnormal gait, Degeneration of lumbar, HTN, gouty arthropathy, OA, lumbar laminectomy 2011, Rt TKA   PAIN:  No specific pain at this time.    PRECAUTIONS: None   WEIGHT BEARING RESTRICTIONS No   FALLS:  Has patient fallen in last 6 months? Maybe 1 fall in last 6 months.  Several falls within last year.    LIVING ENVIRONMENT: Lives with:lives alone Lives in: House/apartment Stairs: 3 steps to enter deck, rail on Lt going up.  No stairs in house.  Has following equipment at home: FWW, cane   OCCUPATION: retired    PLOF: Independent  , cooking/cleaning, shopping/grocery store   [place] stronger, walking independently.     OBJECTIVE:    PATIENT SURVEYS:  01/25/2022 No foto - incorrect set up     COGNITION: 01/25/2022 Overall cognitive status: Within  functional limits for tasks assessed     SENSATION: 01/25/2022 not tested today   POSTURE:  01/25/2022 rounded shoulders, forward head, increased thoracic kyphosis, and flexed trunk    PALPATION: 01/25/2022 no specific tenderness noted in screening for balance.      ROM:   ROM Right eval Left eval  Shoulder flexion      Shoulder extension      Shoulder abduction      Shoulder adduction      Shoulder extension      Shoulder internal rotation      Shoulder external rotation      Elbow flexion      Elbow extension                                                 (Blank rows = not tested)   MMT:            01/25/2022:  Seated resisted static testing hip abduction Rt: strong/painless. Lt moderate strength, painless (held MMT due to difficulty in bed mobility positioning MMT Right eval Left eval  Shoulder flexion      Shoulder extension      Shoulder abduction      Shoulder adduction      Shoulder extension      Shoulder internal rotation      Shoulder external rotation                                         Hip flexion 5/5 4/5  Hip extension      Hip abduction      Knee flexion 5/5 5/5  Knee extension  5/5 5/5  Ankle DF 5/5 4 /5   (Blank rows = not tested)   CERVICAL SPECIAL TESTS:  01/25/2022 none performed     FUNCTIONAL TESTS:  01/25/2022        TUG:   71 seconds c FWW     [facility] PT Assessment - 01/25/22 0001                Standardized Balance Assessment    Standardized Balance Assessment Berg Balance Test          Berg Balance Test    Sit  to Stand Able to stand using hands after several tries     Standing Unsupported Able to stand 2 minutes with supervision     Sitting with Back Unsupported but Feet Supported on Floor or Stool Able to sit safely and securely 2 minutes     Stand to Sit Uses backs of legs against chair to control descent     Transfers Able to transfer safely, definite need of hands     Standing Unsupported with Eyes Closed Able to  stand 3 seconds     Standing Unsupported with Feet Together Needs help to attain position but able to stand for 30 seconds with feet together     From Standing, Reach Forward with Outstretched Arm Can reach forward >5 cm safely (2")     From Standing Position, Pick up Object from Floor Unable to pick up and needs supervision     From Standing Position, Turn to Look Behind Over each Shoulder Needs assist to keep from losing balance and falling     Turn 360 Degrees Needs assistance while turning     Standing Unsupported, Alternately Place Feet on Step/Stool Needs assistance to keep from falling or unable to try     Standing Unsupported, One Foot in Front Needs help to step but can hold 15 seconds     Standing on One Leg Unable to try or needs assist to prevent fall     Total Score 21                           TODAY'S TREATMENT:  02/15/22 Nu step L4 X 7 min UE/LE Standing rows red X15 in RW Standing hip abduction X10 bilat with UE support Standing hip marches X 10 bilat with UE support Seated LAQ 2# 2X10 bilat  Functional Activities for sit to stand, stairs, gait: Sit to stand with perfect posture at the top and slow eccentrics 2 sets of 5  [place] at counter top 3 round trips with bilat UE support Walking with one UE support at counter top 3 round trips Ambulation in RW 150 feet with supervision  Neuromuscular re-education:  Tandem static balance R/L and L/R modified 20 sec X 3 Balance with feet together and head turns X 10 bilat Balance with fee together and head nods up/down X 10 Balance on airex pad 30 sec X 4  02/10/22 Shoulder blade pinches 5X 5 seconds Standing hip abduction in parallel bars 10X 3 seconds B Alternating hip hike in walker 10X 3 seconds  Functional Activities for sit to stand, stairs, gait: Sit to stand with perfect posture at the top and slow eccentrics 2 sets of 5  Dynamic gait with postural cues in the parallel bars 4 laps  Neuromuscular  re-education: Tandem static balance R/L and L/R with cues for step-strategy 5X B; Feet together balance with no hands working on step-strategy 5X   02/01/22 -Seated LAQ X 15 bilat -Seated marches X15 bilat -Sit to stand 2X5 with UE support -Standing hip abductions with bilat UE support X10 -modified tandem balance in RW 20 sec X 2 bilat -Balance with feet apart in RW with head turns X10 bilat -Nu step L4 X 6 min UE/LE   01/25/2022 Therex:             HEP instruction/performance c cues for techniques, handout provided.  Trial set performed of each for comprehension and symptom assessment.  See below for exercise list.  PATIENT EDUCATION:  Education details: HEP, POC, fall risk Person educated: Patient Education method: Consulting civil engineer, Demonstration, Verbal cues, and Handouts Education comprehension: verbalized understanding, returned demonstration, and verbal cues required     HOME EXERCISE PROGRAM: Access Code: [email or web address] URL: [email or web address] Date: 01/25/2022 Prepared by: Scot Jun   Exercises - Seated March  - 1-2 x daily - 7 x weekly - 1-2 sets - 10 reps - Sit to Stand  - 2-3 x daily - 7 x weekly - 1 sets - 5-10 reps - Seated Long Arc Quad  - 2-3 x daily - 7 x weekly - 1-2 sets - 10 reps - 2 hold   ASSESSMENT:   CLINICAL IMPRESSION: She had fair overall tolerance to exercise program progression today but needs rest breaks. She is not limited by pain but limited by fatigue, general weakness, and balance that we will continue to work to improve with PT as able.      OBJECTIVE IMPAIRMENTS Abnormal gait, decreased activity tolerance, decreased balance, decreased endurance, decreased mobility, difficulty walking, decreased strength, impaired perceived functional ability, impaired flexibility, improper body mechanics, postural dysfunction, and pain.    ACTIVITY LIMITATIONS carrying, lifting, bending, standing, squatting, stairs, transfers, bed mobility,  reach over head, and locomotion level   PARTICIPATION LIMITATIONS: meal prep, cleaning, interpersonal relationship, driving, shopping, and community activity   PERSONAL FACTORS   History of 10/19/21 C3-4 C4-5 C5-6 ACDF with soft collar PMH bracial neuritis, abnormal gait, Degeneration of lumbar, HTN, gouty arthropathy, OA, lumbar laminectomy 2011, Rt TKA  are also affecting patient's functional outcome.    REHAB POTENTIAL: Good   CLINICAL DECISION MAKING: Stable/uncomplicated   EVALUATION COMPLEXITY: Low     GOALS: Goals reviewed with patient? Yes   Short term PT Goals (target date for Short term goals are 3 weeks 02/15/2022) Patient will demonstrate independent use of home exercise program to maintain progress from in clinic treatments. Goal status: On Going 02/10/2022   Long term PT goals (target dates for all long term goals are 10 weeks  04/05/2022 )   1. Patient will demonstrate/report pain at worst less than or equal to 2/10 to facilitate minimal limitation in daily activity secondary to pain symptoms. Goal status: New   2. Patient will demonstrate independent use of home exercise program to facilitate ability to maintain/progress functional gains from skilled physical therapy services. Goal status: New   3. Patient will demonstrate bilateral hip MMT 5/5 throughout to facilitate improved transfers, ambulation towards independence.  Goal status: New   4.  Patient will demonstrate BERG testing > or = 45 to indicate reduced fall risk.  Goal status: New   5.  Patient will demonstrate TUG c LRAD < or = 20 seconds to indicate reduced fall risk/ improved community ambulation.    Goal status: New   6.  Patient will demonstrate ability to ambulation c LRAD (SPC, independent) community distances > 300 ft.   Goal status: New       PLAN: PT FREQUENCY: 1-2x/week   PT DURATION: 10 weeks   PLANNED INTERVENTIONS: Therapeutic exercises, Therapeutic activity, Neuro Muscular  re-education, Balance training, Gait training, Patient/Family education, Joint mobilization, Stair training, DME instructions, Dry Needling, Electrical stimulation, Cryotherapy, Moist heat, Taping, Ultrasound, Ionotophoresis '4mg'$ /ml Dexamethasone, and Manual therapy.  All included unless contraindicated   PLAN FOR NEXT SESSION: Progressive static balance, BLE strengthening, nustep for endurance.    Debbe Odea, PT, DPT 02/15/2022, 10:58 AM

## 2022-02-15 NOTE — Patient Instructions (Signed)

## 2022-02-17 ENCOUNTER — Other Ambulatory Visit: Payer: Self-pay | Admitting: Internal Medicine

## 2022-02-17 ENCOUNTER — Ambulatory Visit: Payer: PPO | Admitting: Internal Medicine

## 2022-02-17 DIAGNOSIS — M961 Postlaminectomy syndrome, not elsewhere classified: Secondary | ICD-10-CM

## 2022-02-17 DIAGNOSIS — M170 Bilateral primary osteoarthritis of knee: Secondary | ICD-10-CM

## 2022-02-17 DIAGNOSIS — M47817 Spondylosis without myelopathy or radiculopathy, lumbosacral region: Secondary | ICD-10-CM

## 2022-02-17 MED ORDER — HYDROCODONE-ACETAMINOPHEN 5-325 MG PO TABS: 1.0000 | ORAL_TABLET | Freq: Four times a day (QID) | ORAL | 0 refills | Status: DC | PRN

## 2022-02-18 ENCOUNTER — Encounter: Payer: PPO | Admitting: Rehabilitative and Restorative Service Providers"

## 2022-02-19 LAB — VITAMIN B1: Vitamin B1 (Thiamine): 12 nmol/L (ref 8–30)

## 2022-02-19 LAB — ZINC: Zinc: 63 ug/dL (ref 60–130)

## 2022-02-22 ENCOUNTER — Encounter: Payer: Self-pay | Admitting: Rehabilitative and Restorative Service Providers"

## 2022-02-22 ENCOUNTER — Ambulatory Visit (INDEPENDENT_AMBULATORY_CARE_PROVIDER_SITE_OTHER): Payer: PPO

## 2022-02-22 ENCOUNTER — Ambulatory Visit (INDEPENDENT_AMBULATORY_CARE_PROVIDER_SITE_OTHER): Payer: PPO | Admitting: Rehabilitative and Restorative Service Providers"

## 2022-02-22 DIAGNOSIS — E538 Deficiency of other specified B group vitamins: Secondary | ICD-10-CM

## 2022-02-22 DIAGNOSIS — R2689 Other abnormalities of gait and mobility: Secondary | ICD-10-CM | POA: Diagnosis not present

## 2022-02-22 DIAGNOSIS — M47817 Spondylosis without myelopathy or radiculopathy, lumbosacral region: Secondary | ICD-10-CM | POA: Diagnosis not present

## 2022-02-22 DIAGNOSIS — M6281 Muscle weakness (generalized): Secondary | ICD-10-CM | POA: Diagnosis not present

## 2022-02-22 DIAGNOSIS — G63 Polyneuropathy in diseases classified elsewhere: Secondary | ICD-10-CM | POA: Diagnosis not present

## 2022-02-22 DIAGNOSIS — M542 Cervicalgia: Secondary | ICD-10-CM | POA: Diagnosis not present

## 2022-02-22 MED ORDER — CYANOCOBALAMIN 1000 MCG/ML IJ SOLN
1000.0000 ug | INTRAMUSCULAR | Status: DC
  Administered 2022-02-22: 1000 ug via INTRAMUSCULAR

## 2022-02-22 NOTE — Progress Notes (Unsigned)
B12 given Please cosign 

## 2022-02-22 NOTE — Therapy (Signed)
OUTPATIENT PHYSICAL THERAPY TREATMENT NOTE   Patient Name: Claudia [name] MRN: [id number] DOB:1934-02-23, 88 y.o., female Today's Date: 02/22/2022   END OF SESSION:   PT End of Session - 02/22/22 1308     Visit Number 5    Number of Visits 20    Date for PT Re-Evaluation 04/05/22    Authorization Type [employer] $15 copay    Progress Note Due on Visit 10    PT Start Time 2500    PT Stop Time 1342    PT Time Calculation (min) 43 min    Activity Tolerance Patient tolerated treatment well    Behavior During Therapy WFL for tasks assessed/performed               Past Medical History:  Diagnosis Date   Abnormality of gait    Brachial neuritis or radiculitis NOS    Degeneration of lumbar or lumbosacral intervertebral disc    Dysrhythmia    Essential hypertension, benign    Gouty arthropathy    Lumbago    Obesity    Osteoarthritis    Pure hypercholesterolemia    Unspecified hypothyroidism    Past Surgical History:  Procedure Laterality Date   ABDOMINAL HYSTERECTOMY  03/13/2010   ANTERIOR CERVICAL DECOMP/DISCECTOMY FUSION N/A 10/19/2021   Procedure: C3-4, C4-5, C5-6 ANTERIOR CERVICAL DISCECTOMY FUSION, ALLOGRAFT, PLATE;  Surgeon: [name], MD;  Location: [facility];  Service: Orthopedics;  Laterality: N/A;   BACK SURGERY     LUMBAR LAMINECTOMY  03/13/2010   right knee replacement  03/13/2010   Patient Active Problem List   Diagnosis Date Noted   Deficiency anemia 02/15/2022   Vitamin B12 deficiency neuropathy ([place]) 02/15/2022   Hypercalcemia 02/15/2022   S/P cervical spinal fusion 11/10/2021   Cervical stricture or stenosis 10/20/2021   Cervical spinal stenosis 10/19/2021   Meningioma ([place]) 08/24/2021   PAF (paroxysmal atrial fibrillation) ([place]) 11/03/2020   Stage 3b chronic kidney disease ([place]) 05/06/2020   Chronic left-sided low back pain without sciatica 12/25/2018   Spinal stenosis in cervical region 05/12/2016   Therapeutic opioid-induced constipation  ([facility]) 01/01/2016   Chronic idiopathic constipation 09/11/2013   Lumbosacral spondylosis without myelopathy 04/09/2013   Postlaminectomy syndrome, lumbar region 04/09/2013   Anticoagulation management encounter 02/01/2012   Neuropathy, peripheral 08/04/2011   Pure hypercholesterolemia 08/04/2011   Essential hypertension, benign 08/04/2011   Hypothyroidism 08/04/2011   DJD (degenerative joint disease) of knee 08/04/2011   Gout 08/04/2011      THERAPY DIAG:  Cervicalgia  Other abnormalities of gait and mobility  Muscle weakness (generalized)  PCP: Janith Lima. MD   REFERRING PROVIDER: [name], MD   REFERRING DIAG: Z98.1 (ICD-10-CM) - S/P cervical spinal fusion    Rationale for Evaluation and Treatment Rehabilitation   ONSET DATE: Dec 2022   SUBJECTIVE:  SUBJECTIVE STATEMENT: She denied any pain upon arrival today.  Reported walking and getting some stronger than before.  No real complaints of fatigue after visit.    PERTINENT HISTORY:  History of 10/19/21 C3-4 C4-5 C5-6 ACDF with soft collar PMH bracial neuritis, abnormal gait, Degeneration of lumbar, HTN, gouty arthropathy, OA, lumbar laminectomy 2011, Rt TKA   PAIN:  No pain upon arrival.    PRECAUTIONS: None   WEIGHT BEARING RESTRICTIONS No   FALLS:  Has patient fallen in last 6 months? Maybe 1 fall in last 6 months.  Several falls within last year.    LIVING ENVIRONMENT: Lives with:lives alone Lives in: House/apartment Stairs: 3 steps to enter deck, rail on Lt going up.  No stairs in house.  Has following equipment at home: FWW, cane   OCCUPATION: retired    PLOF: Independent  , cooking/cleaning, shopping/grocery store   [place] stronger, walking independently.     OBJECTIVE:     PATIENT SURVEYS:  01/25/2022 No foto - incorrect set up    COGNITION: 01/25/2022 Overall cognitive status: Within functional limits for tasks assessed     SENSATION: 01/25/2022 not tested today   POSTURE:  01/25/2022 rounded shoulders, forward head, increased thoracic kyphosis, and flexed trunk    PALPATION: 01/25/2022 no specific tenderness noted in screening for balance.      ROM:   ROM Right eval Left eval  Shoulder flexion      Shoulder extension      Shoulder abduction      Shoulder adduction      Shoulder extension      Shoulder internal rotation      Shoulder external rotation      Elbow flexion      Elbow extension                                                 (Blank rows = not tested)   MMT:            01/25/2022:  Seated resisted static testing hip abduction Rt: strong/painless. Lt moderate strength, painless (held MMT due to difficulty in bed mobility positioning MMT Right 01/25/2022 Left 01/25/2022 Left 02/22/2022  Shoulder flexion       Shoulder extension       Shoulder abduction       Shoulder adduction       Shoulder extension       Shoulder internal rotation       Shoulder external rotation                                               Hip flexion 5/5 4/5 4/5  Hip extension       Hip abduction       Knee flexion 5/5 5/5   Knee extension  5/5 5/5   Ankle DF 5/5 4 /5    (Blank rows = not tested)   CERVICAL SPECIAL TESTS:  01/25/2022 none performed   FUNCTIONAL TESTS:  01/25/2022        TUG:   71 seconds c FWW     [facility] PT Assessment - 01/25/22 0001  Standardized Balance Assessment    Standardized Balance Assessment Berg Balance Test          Berg Balance Test    Sit to Stand Able to stand using hands after several tries     Standing Unsupported Able to stand 2 minutes with supervision     Sitting with Back Unsupported but Feet Supported on Floor or Stool Able to sit safely and securely 2 minutes     Stand to Sit Uses  backs of legs against chair to control descent     Transfers Able to transfer safely, definite need of hands     Standing Unsupported with Eyes Closed Able to stand 3 seconds     Standing Unsupported with Feet Together Needs help to attain position but able to stand for 30 seconds with feet together     From Standing, Reach Forward with Outstretched Arm Can reach forward >5 cm safely (2")     From Standing Position, Pick up Object from Floor Unable to pick up and needs supervision     From Standing Position, Turn to Look Behind Over each Shoulder Needs assist to keep from losing balance and falling     Turn 360 Degrees Needs assistance while turning     Standing Unsupported, Alternately Place Feet on Step/Stool Needs assistance to keep from falling or unable to try     Standing Unsupported, One Foot in [place] help to step but can hold 15 seconds     Standing on One Leg Unable to try or needs assist to prevent fall     Total Score 21                    TODAY'S TREATMENT:  02/22/2022 Therex: Nu step Lvl 5 10 mins UE/LE Seated marching unsupported back x 10 bilateral  TherActivity for sit to stand, stairs, gait: 20 inch chair sit to stand to sit 2 x 5 c slow lowering focus, no hand assist for lowering Ambulation c FWW 20 ft, 80 ft, 100 ft  Neuromuscular re-education:  BIG inspired stepping fwd x 10 bilateral, retro x 10 bilateral Tandem static balance modified, performed bilateral c CGA 30 sec x 1 bilateral Church pew anterior/posterior 2 mins c CGA   02/15/22 Nu step L4 X 7 min UE/LE Standing rows red X15 in RW Standing hip abduction X10 bilat with UE support Standing hip marches X 10 bilat with UE support Seated LAQ 2# 2X10 bilat  Functional Activities for sit to stand, stairs, gait: Sit to stand with perfect posture at the top and slow eccentrics 2 sets of 5  Sidestepping at counter top 3 round trips with bilat UE support Walking with one UE support at counter top 3  round trips Ambulation in RW 150 feet with supervision  Neuromuscular re-education:  Tandem static balance R/L and L/R modified 20 sec X 3 Balance with feet together and head turns X 10 bilat Balance with fee together and head nods up/down X 10 Balance on airex pad 30 sec X 4  02/10/22 Shoulder blade pinches 5X 5 seconds Standing hip abduction in parallel bars 10X 3 seconds B Alternating hip hike in walker 10X 3 seconds  Functional Activities for sit to stand, stairs, gait: Sit to stand with perfect posture at the top and slow eccentrics 2 sets of 5  Dynamic gait with postural cues in the parallel bars 4 laps  Neuromuscular re-education: Tandem static balance R/L and L/R with cues for step-strategy  5X B; Feet together balance with no hands working on step-strategy 5X        PATIENT EDUCATION:  Education details: HEP, POC, fall risk Person educated: Patient Education method: Consulting civil engineer, Media planner, Verbal cues, and Handouts Education comprehension: verbalized understanding, returned demonstration, and verbal cues required     HOME EXERCISE PROGRAM: Access Code: [email or web address] URL: [email or web address] Date: 01/25/2022 Prepared by: Scot Jun   Exercises - Seated March  - 1-2 x daily - 7 x weekly - 1-2 sets - 10 reps - Sit to Stand  - 2-3 x daily - 7 x weekly - 1 sets - 5-10 reps - Seated Long Arc Quad  - 2-3 x daily - 7 x weekly - 1-2 sets - 10 reps - 2 hold   ASSESSMENT:   CLINICAL IMPRESSION: Pt continued to present c necessity for skilled PT services to improve strength, balance and overall endurance for functional activity. Early improvements in static balance but still showing limitations in functional movement control that require assistive devices for ambulation.      OBJECTIVE IMPAIRMENTS Abnormal gait, decreased activity tolerance, decreased balance, decreased endurance, decreased mobility, difficulty walking, decreased strength, impaired  perceived functional ability, impaired flexibility, improper body mechanics, postural dysfunction, and pain.    ACTIVITY LIMITATIONS carrying, lifting, bending, standing, squatting, stairs, transfers, bed mobility, reach over head, and locomotion level   PARTICIPATION LIMITATIONS: meal prep, cleaning, interpersonal relationship, driving, shopping, and community activity   PERSONAL FACTORS   History of 10/19/21 C3-4 C4-5 C5-6 ACDF with soft collar PMH bracial neuritis, abnormal gait, Degeneration of lumbar, HTN, gouty arthropathy, OA, lumbar laminectomy 2011, Rt TKA  are also affecting patient's functional outcome.    REHAB POTENTIAL: Good   CLINICAL DECISION MAKING: Stable/uncomplicated   EVALUATION COMPLEXITY: Low     GOALS: Goals reviewed with patient? Yes   Short term PT Goals (target date for Short term goals are 3 weeks 02/15/2022) Patient will demonstrate independent use of home exercise program to maintain progress from in clinic treatments. Goal status: On Going 02/10/2022   Long term PT goals (target dates for all long term goals are 10 weeks  04/05/2022 )   1. Patient will demonstrate/report pain at worst less than or equal to 2/10 to facilitate minimal limitation in daily activity secondary to pain symptoms. Goal status: New   2. Patient will demonstrate independent use of home exercise program to facilitate ability to maintain/progress functional gains from skilled physical therapy services. Goal status: New   3. Patient will demonstrate bilateral hip MMT 5/5 throughout to facilitate improved transfers, ambulation towards independence.  Goal status: New   4.  Patient will demonstrate BERG testing > or = 45 to indicate reduced fall risk.  Goal status: New   5.  Patient will demonstrate TUG c LRAD < or = 20 seconds to indicate reduced fall risk/ improved community ambulation.    Goal status: New   6.  Patient will demonstrate ability to ambulation c LRAD (SPC,  independent) community distances > 300 ft.   Goal status: New       PLAN: PT FREQUENCY: 1-2x/week   PT DURATION: 10 weeks   PLANNED INTERVENTIONS: Therapeutic exercises, Therapeutic activity, Neuro Muscular re-education, Balance training, Gait training, Patient/Family education, Joint mobilization, Stair training, DME instructions, Dry Needling, Electrical stimulation, Cryotherapy, Moist heat, Taping, Ultrasound, Ionotophoresis '4mg'$ /ml Dexamethasone, and Manual therapy.  All included unless contraindicated   PLAN FOR NEXT SESSION: Static balance, functional stepping improvements for ambulation gains, LE strengthening.  Scot Jun, PT, DPT, OCS, ATC 02/22/22  1:46 PM

## 2022-02-25 ENCOUNTER — Encounter: Payer: PPO | Admitting: Rehabilitative and Restorative Service Providers"

## 2022-03-08 ENCOUNTER — Encounter: Payer: Self-pay | Admitting: Rehabilitative and Restorative Service Providers"

## 2022-03-08 ENCOUNTER — Ambulatory Visit (INDEPENDENT_AMBULATORY_CARE_PROVIDER_SITE_OTHER): Payer: PPO | Admitting: Rehabilitative and Restorative Service Providers"

## 2022-03-08 DIAGNOSIS — R2689 Other abnormalities of gait and mobility: Secondary | ICD-10-CM | POA: Diagnosis not present

## 2022-03-08 DIAGNOSIS — M6281 Muscle weakness (generalized): Secondary | ICD-10-CM | POA: Diagnosis not present

## 2022-03-08 DIAGNOSIS — M542 Cervicalgia: Secondary | ICD-10-CM

## 2022-03-08 NOTE — Therapy (Signed)
OUTPATIENT PHYSICAL THERAPY TREATMENT NOTE   Patient Name: Claudia [name] MRN: [id number] DOB:10-19-1933, 88 y.o., female Today's Date: 03/08/2022   END OF SESSION:   PT End of Session - 03/08/22 1255     Visit Number 6    Number of Visits 20    Date for PT Re-Evaluation 04/05/22    Authorization Type [employer] $15 copay    Progress Note Due on Visit 10    PT Start Time 1259    PT Stop Time 1342    PT Time Calculation (min) 43 min    Activity Tolerance Patient tolerated treatment well    Behavior During Therapy WFL for tasks assessed/performed                Past Medical History:  Diagnosis Date   Abnormality of gait    Brachial neuritis or radiculitis NOS    Degeneration of lumbar or lumbosacral intervertebral disc    Dysrhythmia    Essential hypertension, benign    Gouty arthropathy    Lumbago    Obesity    Osteoarthritis    Pure hypercholesterolemia    Unspecified hypothyroidism    Past Surgical History:  Procedure Laterality Date   ABDOMINAL HYSTERECTOMY  03/13/2010   ANTERIOR CERVICAL DECOMP/DISCECTOMY FUSION N/A 10/19/2021   Procedure: C3-4, C4-5, C5-6 ANTERIOR CERVICAL DISCECTOMY FUSION, ALLOGRAFT, PLATE;  Surgeon: [name], MD;  Location: [facility];  Service: Orthopedics;  Laterality: N/A;   BACK SURGERY     LUMBAR LAMINECTOMY  03/13/2010   right knee replacement  03/13/2010   Patient Active Problem List   Diagnosis Date Noted   Deficiency anemia 02/15/2022   Vitamin B12 deficiency neuropathy ([place]) 02/15/2022   Hypercalcemia 02/15/2022   S/P cervical spinal fusion 11/10/2021   Cervical stricture or stenosis 10/20/2021   Cervical spinal stenosis 10/19/2021   Meningioma ([place]) 08/24/2021   PAF (paroxysmal atrial fibrillation) ([place]) 11/03/2020   Stage 3b chronic kidney disease ([place]) 05/06/2020   Chronic left-sided low back pain without sciatica 12/25/2018   Spinal stenosis in cervical region 05/12/2016   Therapeutic opioid-induced constipation  ([facility]) 01/01/2016   Chronic idiopathic constipation 09/11/2013   Lumbosacral spondylosis without myelopathy 04/09/2013   Postlaminectomy syndrome, lumbar region 04/09/2013   Anticoagulation management encounter 02/01/2012   Neuropathy, peripheral 08/04/2011   Pure hypercholesterolemia 08/04/2011   Essential hypertension, benign 08/04/2011   Hypothyroidism 08/04/2011   DJD (degenerative joint disease) of knee 08/04/2011   Gout 08/04/2011      THERAPY DIAG:  Cervicalgia  Other abnormalities of gait and mobility  Muscle weakness (generalized)  PCP: Janith Lima. MD   REFERRING PROVIDER: [name], MD   REFERRING DIAG: Z98.1 (ICD-10-CM) - S/P cervical spinal fusion    Rationale for Evaluation and Treatment Rehabilitation   ONSET DATE: Dec 2022   SUBJECTIVE:  SUBJECTIVE STATEMENT: No complaints of pain reported.  Feeling like she can do more than before.    PERTINENT HISTORY:  History of 10/19/21 C3-4 C4-5 C5-6 ACDF with soft collar PMH bracial neuritis, abnormal gait, Degeneration of lumbar, HTN, gouty arthropathy, OA, lumbar laminectomy 2011, Rt TKA   PAIN:  No pain upon arrival.    PRECAUTIONS: None   WEIGHT BEARING RESTRICTIONS No   FALLS:  Has patient fallen in last 6 months? Maybe 1 fall in last 6 months.  Several falls within last year.    LIVING ENVIRONMENT: Lives with:lives alone Lives in: House/apartment Stairs: 3 steps to enter deck, rail on Lt going up.  No stairs in house.  Has following equipment at home: FWW, cane   OCCUPATION: retired    PLOF: Independent  , cooking/cleaning, shopping/grocery store   [place] stronger, walking independently.     OBJECTIVE:    PATIENT SURVEYS:  01/25/2022 No foto - incorrect set up     COGNITION: 01/25/2022 Overall cognitive status: Within functional limits for tasks assessed     SENSATION: 01/25/2022 not tested today   POSTURE:  01/25/2022 rounded shoulders, forward head, increased thoracic kyphosis, and flexed trunk    PALPATION: 01/25/2022 no specific tenderness noted in screening for balance.      ROM:   ROM Right eval Left eval  Shoulder flexion      Shoulder extension      Shoulder abduction      Shoulder adduction      Shoulder extension      Shoulder internal rotation      Shoulder external rotation      Elbow flexion      Elbow extension                                                 (Blank rows = not tested)   MMT:            01/25/2022:  Seated resisted static testing hip abduction Rt: strong/painless. Lt moderate strength, painless (held MMT due to difficulty in bed mobility positioning MMT Right 01/25/2022 Left 01/25/2022 Left 02/22/2022  Shoulder flexion       Shoulder extension       Shoulder abduction       Shoulder adduction       Shoulder extension       Shoulder internal rotation       Shoulder external rotation                                               Hip flexion 5/5 4/5 4/5  Hip extension       Hip abduction       Knee flexion 5/5 5/5   Knee extension  5/5 5/5   Ankle DF 5/5 4 /5    (Blank rows = not tested)   CERVICAL SPECIAL TESTS:  01/25/2022 none performed   FUNCTIONAL TESTS:  03/08/2022:   TUG:  41.76 seconds c FWW  01/25/2022        TUG:   71 seconds c FWW     [facility] PT Assessment - 01/25/22 0001  Standardized Balance Assessment    Standardized Balance Assessment Berg Balance Test          Berg Balance Test    Sit to Stand Able to stand using hands after several tries     Standing Unsupported Able to stand 2 minutes with supervision     Sitting with Back Unsupported but Feet Supported on Floor or Stool Able to sit safely and securely 2 minutes     Stand to Sit Uses backs of legs  against chair to control descent     Transfers Able to transfer safely, definite need of hands     Standing Unsupported with Eyes Closed Able to stand 3 seconds     Standing Unsupported with Feet Together Needs help to attain position but able to stand for 30 seconds with feet together     From Standing, Reach Forward with Outstretched Arm Can reach forward >5 cm safely (2")     From Standing Position, Pick up Object from Floor Unable to pick up and needs supervision     From Standing Position, Turn to Look Behind Over each Shoulder Needs assist to keep from losing balance and falling     Turn 360 Degrees Needs assistance while turning     Standing Unsupported, Alternately Place Feet on Step/Stool Needs assistance to keep from falling or unable to try     Standing Unsupported, One Foot in [place] help to step but can hold 15 seconds     Standing on One Leg Unable to try or needs assist to prevent fall     Total Score 21                    TODAY'S TREATMENT:  03/08/2022 Therex: Nu step Lvl 5 12 mins UE/LE Seated LAQ 3 lbs 2 x 10 bilateral   TherActivity for sit to stand, stairs, gait: 18 inch chair x5 c hands c slow lowering focus, no hand assist for lowering TUG x 1 Ambulation c FWW 50 ft x 2  Neuromuscular re-education:  Heel toe raises x 10 (cues for home use) Modified tandem stance in walker 15 seconds x 6 bilateral Side stepping bilateral hands on bar 10 ft x 2 each way   02/22/2022 Therex: Nu step Lvl 5 10 mins UE/LE Seated marching unsupported back x 10 bilateral  TherActivity for sit to stand, stairs, gait: 20 inch chair sit to stand to sit 2 x 5 c slow lowering focus, no hand assist for lowering Ambulation c FWW 20 ft, 80 ft, 100 ft  Neuromuscular re-education:  BIG inspired stepping fwd x 10 bilateral, retro x 10 bilateral Tandem static balance modified, performed bilateral c CGA 30 sec x 1 bilateral Church pew anterior/posterior 2 mins c  CGA   02/15/22 Nu step L4 X 7 min UE/LE Standing rows red X15 in RW Standing hip abduction X10 bilat with UE support Standing hip marches X 10 bilat with UE support Seated LAQ 2# 2X10 bilat  Functional Activities for sit to stand, stairs, gait: Sit to stand with perfect posture at the top and slow eccentrics 2 sets of 5  Sidestepping at counter top 3 round trips with bilat UE support Walking with one UE support at counter top 3 round trips Ambulation in RW 150 feet with supervision  Neuromuscular re-education:  Tandem static balance R/L and L/R modified 20 sec X 3 Balance with feet together and head turns X 10 bilat Balance with fee together and head  nods up/down X 10 Balance on airex pad 30 sec X 4   PATIENT EDUCATION:  03/08/2022 Education details: HEP progression Person educated: Patient Education method: Consulting civil engineer, Media planner, Verbal cues, and Handouts Education comprehension: verbalized understanding, returned demonstration, and verbal cues required     HOME EXERCISE PROGRAM: Access Code: [email or web address] URL: [email or web address] Date: 03/08/2022 Prepared by: Scot Jun  Exercises - Seated March  - 1-2 x daily - 7 x weekly - 1-2 sets - 10 reps - Sit to Stand  - 2-3 x daily - 7 x weekly - 1 sets - 5-10 reps - Seated Long Arc Quad  - 2-3 x daily - 7 x weekly - 1-2 sets - 10 reps - 2 hold - Standing Scapular Retraction  - 5 x daily - 7 x weekly - 1 sets - 5 reps - 5 second hold - Heel Toe Raises with Counter Support  - 1-2 x daily - 7 x weekly - 1-2 sets - 10 reps - Standing Tandem Balance with Counter Support  - 1-2 x daily - 7 x weekly - 1 sets - 10 reps   ASSESSMENT:   CLINICAL IMPRESSION: TUG improved as noted.  Continued range for improvement.  Continued reduced gait speed below community Ambulator in quality but improving.  Continued strengthening and balance improvements to help continue to help reduce fall risk.      OBJECTIVE IMPAIRMENTS  Abnormal gait, decreased activity tolerance, decreased balance, decreased endurance, decreased mobility, difficulty walking, decreased strength, impaired perceived functional ability, impaired flexibility, improper body mechanics, postural dysfunction, and pain.    ACTIVITY LIMITATIONS carrying, lifting, bending, standing, squatting, stairs, transfers, bed mobility, reach over head, and locomotion level   PARTICIPATION LIMITATIONS: meal prep, cleaning, interpersonal relationship, driving, shopping, and community activity   PERSONAL FACTORS   History of 10/19/21 C3-4 C4-5 C5-6 ACDF with soft collar PMH bracial neuritis, abnormal gait, Degeneration of lumbar, HTN, gouty arthropathy, OA, lumbar laminectomy 2011, Rt TKA  are also affecting patient's functional outcome.    REHAB POTENTIAL: Good   CLINICAL DECISION MAKING: Stable/uncomplicated   EVALUATION COMPLEXITY: Low     GOALS: Goals reviewed with patient? Yes   Short term PT Goals (target date for Short term goals are 3 weeks 02/15/2022) Patient will demonstrate independent use of home exercise program to maintain progress from in clinic treatments. Goal status: On Going 02/10/2022   Long term PT goals (target dates for all long term goals are 10 weeks  04/05/2022 )   1. Patient will demonstrate/report pain at worst less than or equal to 2/10 to facilitate minimal limitation in daily activity secondary to pain symptoms. Goal status: on going assessed 03/08/2022   2. Patient will demonstrate independent use of home exercise program to facilitate ability to maintain/progress functional gains from skilled physical therapy services. Goal status: on going assessed 03/08/2022   3. Patient will demonstrate bilateral hip MMT 5/5 throughout to facilitate improved transfers, ambulation towards independence.  Goal status: on going assessed 03/08/2022   4.  Patient will demonstrate BERG testing > or = 45 to indicate reduced fall risk.  Goal status: on  going assessed 03/08/2022   5.  Patient will demonstrate TUG c LRAD < or = 20 seconds to indicate reduced fall risk/ improved community ambulation.    Goal status: on going assessed 03/08/2022   6.  Patient will demonstrate ability to ambulation c LRAD (SPC, independent) community distances > 300 ft.   Goal status: on going assessed 03/08/2022  PLAN: PT FREQUENCY: 1-2x/week   PT DURATION: 10 weeks   PLANNED INTERVENTIONS: Therapeutic exercises, Therapeutic activity, Neuro Muscular re-education, Balance training, Gait training, Patient/Family education, Joint mobilization, Stair training, DME instructions, Dry Needling, Electrical stimulation, Cryotherapy, Moist heat, Taping, Ultrasound, Ionotophoresis '4mg'$ /ml Dexamethasone, and Manual therapy.  All included unless contraindicated   PLAN FOR NEXT SESSION: Continue strengthening and balance improvements, Check MMT.   Scot Jun, PT, DPT, OCS, ATC 03/08/22  1:41 PM

## 2022-03-11 ENCOUNTER — Encounter: Payer: Self-pay | Admitting: Rehabilitative and Restorative Service Providers"

## 2022-03-11 ENCOUNTER — Ambulatory Visit: Payer: PPO | Admitting: Rehabilitative and Restorative Service Providers"

## 2022-03-11 DIAGNOSIS — M542 Cervicalgia: Secondary | ICD-10-CM | POA: Diagnosis not present

## 2022-03-11 DIAGNOSIS — R2689 Other abnormalities of gait and mobility: Secondary | ICD-10-CM

## 2022-03-11 DIAGNOSIS — M6281 Muscle weakness (generalized): Secondary | ICD-10-CM | POA: Diagnosis not present

## 2022-03-11 NOTE — Therapy (Signed)
OUTPATIENT PHYSICAL THERAPY TREATMENT NOTE   Patient Name: ZONA PEDRO MRN: [id number] DOB:[birth year], 88 y.o., female Today's Date: 03/11/2022   END OF SESSION:   PT End of Session - 03/11/22 1523     Visit Number 7    Number of Visits 20    Date for PT Re-Evaluation 04/05/22    Authorization Type [employer] $15 copay    Progress Note Due on Visit 10    PT Start Time 1514    PT Stop Time 1553    PT Time Calculation (min) 39 min    Activity Tolerance Patient tolerated treatment well    Behavior During Therapy WFL for tasks assessed/performed                 Past Medical History:  Diagnosis Date   Abnormality of gait    Brachial neuritis or radiculitis NOS    Degeneration of lumbar or lumbosacral intervertebral disc    Dysrhythmia    Essential hypertension, benign    Gouty arthropathy    Lumbago    Obesity    Osteoarthritis    Pure hypercholesterolemia    Unspecified hypothyroidism    Past Surgical History:  Procedure Laterality Date   ABDOMINAL HYSTERECTOMY  03/13/2010   ANTERIOR CERVICAL DECOMP/DISCECTOMY FUSION N/A 10/19/2021   Procedure: C3-4, C4-5, C5-6 ANTERIOR CERVICAL DISCECTOMY FUSION, ALLOGRAFT, PLATE;  Surgeon: [name], MD;  Location: [facility];  Service: Orthopedics;  Laterality: N/A;   BACK SURGERY     LUMBAR LAMINECTOMY  03/13/2010   right knee replacement  03/13/2010   Patient Active Problem List   Diagnosis Date Noted   Deficiency anemia 02/15/2022   Vitamin B12 deficiency neuropathy ([place]) 02/15/2022   Hypercalcemia 02/15/2022   S/P cervical spinal fusion 11/10/2021   Cervical stricture or stenosis 10/20/2021   Cervical spinal stenosis 10/19/2021   Meningioma ([place]) 08/24/2021   PAF (paroxysmal atrial fibrillation) ([place]) 11/03/2020   Stage 3b chronic kidney disease ([place]) 05/06/2020   Chronic left-sided low back pain without sciatica 12/25/2018   Spinal stenosis in cervical region 05/12/2016   Therapeutic opioid-induced  constipation ([facility]) 01/01/2016   Chronic idiopathic constipation 09/11/2013   Lumbosacral spondylosis without myelopathy 04/09/2013   Postlaminectomy syndrome, lumbar region 04/09/2013   Anticoagulation management encounter 02/01/2012   Neuropathy, peripheral 08/04/2011   Pure hypercholesterolemia 08/04/2011   Essential hypertension, benign 08/04/2011   Hypothyroidism 08/04/2011   DJD (degenerative joint disease) of knee 08/04/2011   Gout 08/04/2011      THERAPY DIAG:  Cervicalgia  Other abnormalities of gait and mobility  Muscle weakness (generalized)  PCP: Janith Lima. MD   REFERRING PROVIDER: [name], MD   REFERRING DIAG: Z98.1 (ICD-10-CM) - S/P cervical spinal fusion    Rationale for Evaluation and Treatment Rehabilitation   ONSET DATE: Dec 2022   SUBJECTIVE:  SUBJECTIVE STATEMENT: No complaints of pain reported. Doing ok today.    PERTINENT HISTORY:  History of 10/19/21 C3-4 C4-5 C5-6 ACDF with soft collar PMH bracial neuritis, abnormal gait, Degeneration of lumbar, HTN, gouty arthropathy, OA, lumbar laminectomy 2011, Rt TKA   PAIN:  No pain upon arrival.    PRECAUTIONS: None   WEIGHT BEARING RESTRICTIONS No   FALLS:  Has patient fallen in last 6 months? Maybe 1 fall in last 6 months.  Several falls within last year.    LIVING ENVIRONMENT: Lives with:lives alone Lives in: House/apartment Stairs: 3 steps to enter deck, rail on Lt going up.  No stairs in house.  Has following equipment at home: FWW, cane   OCCUPATION: retired    PLOF: Independent  , cooking/cleaning, shopping/grocery store   [place] stronger, walking independently.     OBJECTIVE:    PATIENT SURVEYS:  01/25/2022 No foto - incorrect set up    COGNITION: 01/25/2022  Overall cognitive status: Within functional limits for tasks assessed     SENSATION: 01/25/2022 not tested today   POSTURE:  01/25/2022 rounded shoulders, forward head, increased thoracic kyphosis, and flexed trunk    PALPATION: 01/25/2022 no specific tenderness noted in screening for balance.      ROM:   ROM Right eval Left eval  Shoulder flexion      Shoulder extension      Shoulder abduction      Shoulder adduction      Shoulder extension      Shoulder internal rotation      Shoulder external rotation      Elbow flexion      Elbow extension                                                 (Blank rows = not tested)   MMT:            01/25/2022:  Seated resisted static testing hip abduction Rt: strong/painless. Lt moderate strength, painless (held MMT due to difficulty in bed mobility positioning MMT Right 01/25/2022 Left 01/25/2022 Left 02/22/2022 Left 03/11/2022  Shoulder flexion        Shoulder extension        Shoulder abduction        Shoulder adduction        Shoulder extension        Shoulder internal rotation        Shoulder external rotation                                                     Hip flexion 5/5 4/5 4/5 4/5  Hip extension        Hip abduction        Knee flexion 5/5 5/5    Knee extension  5/5 5/5    Ankle DF 5/5 4/5  4/5   (Blank rows = not tested)   CERVICAL SPECIAL TESTS:  01/25/2022 none performed   FUNCTIONAL TESTS:  03/08/2022:   TUG:  41.76 seconds c FWW  01/25/2022        TUG:   71 seconds c FWW     [facility] PT Assessment - 01/25/22  0001                Standardized Balance Assessment    Standardized Balance Assessment Berg Balance Test          Berg Balance Test    Sit to Stand Able to stand using hands after several tries     Standing Unsupported Able to stand 2 minutes with supervision     Sitting with Back Unsupported but Feet Supported on Floor or Stool Able to sit safely and securely 2 minutes     Stand to Sit Uses  backs of legs against chair to control descent     Transfers Able to transfer safely, definite need of hands     Standing Unsupported with Eyes Closed Able to stand 3 seconds     Standing Unsupported with Feet Together Needs help to attain position but able to stand for 30 seconds with feet together     From Standing, Reach Forward with Outstretched Arm Can reach forward >5 cm safely (2")     From Standing Position, Pick up Object from Floor Unable to pick up and needs supervision     From Standing Position, Turn to Look Behind Over each Shoulder Needs assist to keep from losing balance and falling     Turn 360 Degrees Needs assistance while turning     Standing Unsupported, Alternately Place Feet on Step/Stool Needs assistance to keep from falling or unable to try     Standing Unsupported, One Foot in Front Needs help to step but can hold 15 seconds     Standing on One Leg Unable to try or needs assist to prevent fall     Total Score 21                    TODAY'S TREATMENT:  03/11/2022 Therex: Nu step Lvl 5 12 mins UE/LE Seated marching alternating unsupported x 10 Rt Seated Lt Quad set 5 sec hold x 10  FWW ambulation 100 ft x 2 c supervision   Neuromuscular re-education:  BIG inspired stepping fwd c CGA with single arm on rail x 10 bilateral (slow movement pattern required additional time for completion Alt foot tapping on 4 inch step x 10 bilateral with variable single hand and double hand support c CGA  03/08/2022 Therex: Nu step Lvl 5 12 mins UE/LE Seated LAQ 3 lbs 2 x 10 bilateral   TherActivity for sit to stand, stairs, gait: 18 inch chair x5 c hands c slow lowering focus, no hand assist for lowering TUG x 1 Ambulation c FWW 50 ft x 2  Neuromuscular re-education:  Heel toe raises x 10 (cues for home use) Modified tandem stance in walker 15 seconds x 6 bilateral Side stepping bilateral hands on bar 10 ft x 2 each way   02/22/2022 Therex: Nu step Lvl 5 10 mins  UE/LE Seated marching unsupported back x 10 bilateral  TherActivity for sit to stand, stairs, gait: 20 inch chair sit to stand to sit 2 x 5 c slow lowering focus, no hand assist for lowering Ambulation c FWW 20 ft, 80 ft, 100 ft  Neuromuscular re-education:  BIG inspired stepping fwd x 10 bilateral, retro x 10 bilateral Tandem static balance modified, performed bilateral c CGA 30 sec x 1 bilateral [place] pew anterior/posterior 2 mins c CGA   PATIENT EDUCATION:  03/08/2022 Education details: HEP progression Person educated: Patient Education method: Consulting civil engineer, Media planner, Verbal cues, and Handouts Education comprehension: verbalized understanding, returned  demonstration, and verbal cues required     HOME EXERCISE PROGRAM: Access Code: [email or web address] URL: [email or web address] Date: 03/08/2022 Prepared by: Scot Jun  Exercises - Seated March  - 1-2 x daily - 7 x weekly - 1-2 sets - 10 reps - Sit to Stand  - 2-3 x daily - 7 x weekly - 1 sets - 5-10 reps - Seated Long Arc Quad  - 2-3 x daily - 7 x weekly - 1-2 sets - 10 reps - 2 hold - Standing Scapular Retraction  - 5 x daily - 7 x weekly - 1 sets - 5 reps - 5 second hold - Heel Toe Raises with Counter Support  - 1-2 x daily - 7 x weekly - 1-2 sets - 10 reps - Standing Tandem Balance with Counter Support  - 1-2 x daily - 7 x weekly - 1 sets - 10 reps   ASSESSMENT:   CLINICAL IMPRESSION: Lt leg strength deficits still greatly impacting functional movement patterns at this time.  Making some early gains to improve balance in dynamic movements but continued help required to continue progression.      OBJECTIVE IMPAIRMENTS Abnormal gait, decreased activity tolerance, decreased balance, decreased endurance, decreased mobility, difficulty walking, decreased strength, impaired perceived functional ability, impaired flexibility, improper body mechanics, postural dysfunction, and pain.    ACTIVITY LIMITATIONS carrying,  lifting, bending, standing, squatting, stairs, transfers, bed mobility, reach over head, and locomotion level   PARTICIPATION LIMITATIONS: meal prep, cleaning, interpersonal relationship, driving, shopping, and community activity   PERSONAL FACTORS   History of 10/19/21 C3-4 C4-5 C5-6 ACDF with soft collar PMH bracial neuritis, abnormal gait, Degeneration of lumbar, HTN, gouty arthropathy, OA, lumbar laminectomy 2011, Rt TKA  are also affecting patient's functional outcome.    REHAB POTENTIAL: Good   CLINICAL DECISION MAKING: Stable/uncomplicated   EVALUATION COMPLEXITY: Low     GOALS: Goals reviewed with patient? Yes   Short term PT Goals (target date for Short term goals are 3 weeks 02/15/2022) Patient will demonstrate independent use of home exercise program to maintain progress from in clinic treatments. Goal status: MET   Long term PT goals (target dates for all long term goals are 10 weeks  04/05/2022 )   1. Patient will demonstrate/report pain at worst less than or equal to 2/10 to facilitate minimal limitation in daily activity secondary to pain symptoms. Goal status: on going assessed 03/08/2022   2. Patient will demonstrate independent use of home exercise program to facilitate ability to maintain/progress functional gains from skilled physical therapy services. Goal status: on going assessed 03/08/2022   3. Patient will demonstrate bilateral hip MMT 5/5 throughout to facilitate improved transfers, ambulation towards independence.  Goal status: on going assessed 03/08/2022   4.  Patient will demonstrate BERG testing > or = 45 to indicate reduced fall risk.  Goal status: on going assessed 03/08/2022   5.  Patient will demonstrate TUG c LRAD < or = 20 seconds to indicate reduced fall risk/ improved community ambulation.    Goal status: on going assessed 03/08/2022   6.  Patient will demonstrate ability to ambulation c LRAD (SPC, independent) community distances > 300 ft.   Goal  status: on going assessed 03/08/2022       PLAN: PT FREQUENCY: 1-2x/week   PT DURATION: 10 weeks   PLANNED INTERVENTIONS: Therapeutic exercises, Therapeutic activity, Neuro Muscular re-education, Balance training, Gait training, Patient/Family education, Joint mobilization, Stair training, DME instructions, Dry Needling, Electrical stimulation, Cryotherapy, Moist heat, Taping,  Ultrasound, Ionotophoresis 4mg /ml Dexamethasone, and Manual therapy.  All included unless contraindicated   PLAN FOR NEXT SESSION: Continue strengthening and balance improvements to improve ambulation.   Scot Jun, PT, DPT, OCS, ATC 03/11/22  3:59 PM

## 2022-03-12 ENCOUNTER — Telehealth: Payer: Self-pay

## 2022-03-12 NOTE — Telephone Encounter (Signed)
Pt was advised that the RSV vaccine was recommended per Dr. Ronnald Ramp.

## 2022-03-12 NOTE — Telephone Encounter (Signed)
Pt is wanting to get the recommendation of Dr. Ronnald Ramp for the whether or not he thinks she should get the RSV vaccine.  Please advise  She has an appt on Monday 03/15/22 and need to cx if its not recommended as soon as possible.

## 2022-03-15 ENCOUNTER — Encounter: Payer: Self-pay | Admitting: Rehabilitative and Restorative Service Providers"

## 2022-03-15 ENCOUNTER — Ambulatory Visit (INDEPENDENT_AMBULATORY_CARE_PROVIDER_SITE_OTHER): Payer: PPO | Admitting: Rehabilitative and Restorative Service Providers"

## 2022-03-15 DIAGNOSIS — M6281 Muscle weakness (generalized): Secondary | ICD-10-CM | POA: Diagnosis not present

## 2022-03-15 DIAGNOSIS — R293 Abnormal posture: Secondary | ICD-10-CM | POA: Diagnosis not present

## 2022-03-15 DIAGNOSIS — R2689 Other abnormalities of gait and mobility: Secondary | ICD-10-CM | POA: Diagnosis not present

## 2022-03-15 DIAGNOSIS — M542 Cervicalgia: Secondary | ICD-10-CM

## 2022-03-15 NOTE — Therapy (Signed)
OUTPATIENT PHYSICAL THERAPY TREATMENT NOTE   Patient Name: Claudia [name] MRN: [id number] DOB:11-10-1933, 88 y.o., female Today's Date: 03/15/2022   END OF SESSION:   PT End of Session - 03/15/22 1308     Visit Number 8    Number of Visits 20    Date for PT Re-Evaluation 04/05/22    Authorization Type [employer] $15 copay    Progress Note Due on Visit 10    PT Start Time 1302    PT Stop Time 1344    PT Time Calculation (min) 42 min    Activity Tolerance Patient tolerated treatment well    Behavior During Therapy WFL for tasks assessed/performed                  Past Medical History:  Diagnosis Date   Abnormality of gait    Brachial neuritis or radiculitis NOS    Degeneration of lumbar or lumbosacral intervertebral disc    Dysrhythmia    Essential hypertension, benign    Gouty arthropathy    Lumbago    Obesity    Osteoarthritis    Pure hypercholesterolemia    Unspecified hypothyroidism    Past Surgical History:  Procedure Laterality Date   ABDOMINAL HYSTERECTOMY  03/13/2010   ANTERIOR CERVICAL DECOMP/DISCECTOMY FUSION N/A 10/19/2021   Procedure: C3-4, C4-5, C5-6 ANTERIOR CERVICAL DISCECTOMY FUSION, ALLOGRAFT, PLATE;  Surgeon: [name], MD;  Location: [facility];  Service: Orthopedics;  Laterality: N/A;   BACK SURGERY     LUMBAR LAMINECTOMY  03/13/2010   right knee replacement  03/13/2010   Patient Active Problem List   Diagnosis Date Noted   Deficiency anemia 02/15/2022   Vitamin B12 deficiency neuropathy ([place]) 02/15/2022   Hypercalcemia 02/15/2022   S/P cervical spinal fusion 11/10/2021   Cervical stricture or stenosis 10/20/2021   Cervical spinal stenosis 10/19/2021   Meningioma ([place]) 08/24/2021   PAF (paroxysmal atrial fibrillation) ([place]) 11/03/2020   Stage 3b chronic kidney disease ([place]) 05/06/2020   Chronic left-sided low back pain without sciatica 12/25/2018   Spinal stenosis in cervical region 05/12/2016   Therapeutic opioid-induced  constipation ([facility]) 01/01/2016   Chronic idiopathic constipation 09/11/2013   Lumbosacral spondylosis without myelopathy 04/09/2013   Postlaminectomy syndrome, lumbar region 04/09/2013   Anticoagulation management encounter 02/01/2012   Neuropathy, peripheral 08/04/2011   Pure hypercholesterolemia 08/04/2011   Essential hypertension, benign 08/04/2011   Hypothyroidism 08/04/2011   DJD (degenerative joint disease) of knee 08/04/2011   Gout 08/04/2011      THERAPY DIAG:  Cervicalgia  Other abnormalities of gait and mobility  Muscle weakness (generalized)  Abnormal posture  PCP: Janith Lima. MD   REFERRING PROVIDER: [name], MD   REFERRING DIAG: Z98.1 (ICD-10-CM) - S/P cervical spinal fusion    Rationale for Evaluation and Treatment Rehabilitation   ONSET DATE: Dec 2022   SUBJECTIVE:  SUBJECTIVE STATEMENT: Pt indicated feeling stiffness in Lt knee after sitting this morning.  Pt indicated no other pain complaints.     PERTINENT HISTORY:  History of 10/19/21 C3-4 C4-5 C5-6 ACDF with soft collar PMH bracial neuritis, abnormal gait, Degeneration of lumbar, HTN, gouty arthropathy, OA, lumbar laminectomy 2011, Rt TKA   PAIN:  No pain upon arrival.    PRECAUTIONS: None   WEIGHT BEARING RESTRICTIONS No   FALLS:  Has patient fallen in last 6 months? Maybe 1 fall in last 6 months.  Several falls within last year.    LIVING ENVIRONMENT: Lives with:lives alone Lives in: House/apartment Stairs: 3 steps to enter deck, rail on Lt going up.  No stairs in house.  Has following equipment at home: FWW, cane   OCCUPATION: retired    PLOF: Independent  , cooking/cleaning, shopping/grocery store   [place] stronger, walking independently.     OBJECTIVE:     PATIENT SURVEYS:  01/25/2022 No foto - incorrect set up    COGNITION: 01/25/2022 Overall cognitive status: Within functional limits for tasks assessed     SENSATION: 01/25/2022 not tested today   POSTURE:  01/25/2022 rounded shoulders, forward head, increased thoracic kyphosis, and flexed trunk    PALPATION: 01/25/2022 no specific tenderness noted in screening for balance.       MMT:            01/25/2022:  Seated resisted static testing hip abduction Rt: strong/painless. Lt moderate strength, painless (held MMT due to difficulty in bed mobility positioning MMT Right 01/25/2022 Left 01/25/2022 Left 02/22/2022 Left 03/11/2022  Shoulder flexion        Shoulder extension        Shoulder abduction        Shoulder adduction        Shoulder extension        Shoulder internal rotation        Shoulder external rotation                                                     Hip flexion 5/5 4/5 4/5 4/5  Hip extension        Hip abduction        Knee flexion 5/5 5/5    Knee extension  5/5 5/5    Ankle DF 5/5 4/5  4/5   (Blank rows = not tested)   CERVICAL SPECIAL TESTS:  01/25/2022 none performed   FUNCTIONAL TESTS:  03/08/2022:   TUG:  41.76 seconds c FWW  01/25/2022        TUG:   71 seconds c FWW     [facility] PT Assessment - 01/25/22 0001                Standardized Balance Assessment    Standardized Balance Assessment Berg Balance Test          Berg Balance Test    Sit to Stand Able to stand using hands after several tries     Standing Unsupported Able to stand 2 minutes with supervision     Sitting with Back Unsupported but Feet Supported on Floor or Stool Able to sit safely and securely 2 minutes     Stand to Sit Uses backs of legs against chair to control descent     Transfers Able  to transfer safely, definite need of hands     Standing Unsupported with Eyes Closed Able to stand 3 seconds     Standing Unsupported with Feet Together Needs help to attain position but able to  stand for 30 seconds with feet together     From Standing, Reach Forward with Outstretched Arm Can reach forward >5 cm safely (2")     From Standing Position, Pick up Object from Floor Unable to pick up and needs supervision     From Standing Position, Turn to Look Behind Over each Shoulder Needs assist to keep from losing balance and falling     Turn 360 Degrees Needs assistance while turning     Standing Unsupported, Alternately Place Feet on Step/Stool Needs assistance to keep from falling or unable to try     Standing Unsupported, One Foot in [place] help to step but can hold 15 seconds     Standing on One Leg Unable to try or needs assist to prevent fall     Total Score 21                    TODAY'S TREATMENT:  03/15/2022 Therex: Seated LAQ 2 x 10 bilateral 3 lb weight Seated Lt quad set 5 sec hold x 10  Nu step Lvl 6 8 mins UE/LE  Neuromuscular re-education:  BIG inspired stepping fwd c CGA with single arm on rail x 10 bilateral (slow movement pattern required additional time for completion - improved compared to last visit) BIG inspired stepping reverse c CGA with single arm on rail x 10 bilateral (slow movement pattern required additional time for completion) Alt foot tapping on 4 inch step x 10 bilateral with variable single hand and double hand support c CGA  03/11/2022 Therex: Nu step Lvl 5 12 mins UE/LE Seated marching alternating unsupported x 10 Rt Seated Lt Quad set 5 sec hold x 10  FWW ambulation 100 ft x 2 c supervision   Neuromuscular re-education:  BIG inspired stepping fwd c CGA with single arm on rail x 10 bilateral (slow movement pattern required additional time for completion Alt foot tapping on 4 inch step x 10 bilateral with variable single hand and double hand support c CGA  03/08/2022 Therex: Nu step Lvl 5 12 mins UE/LE Seated LAQ 3 lbs 2 x 10 bilateral   TherActivity for sit to stand, stairs, gait: 18 inch chair x5 c hands c slow lowering  focus, no hand assist for lowering TUG x 1 Ambulation c FWW 50 ft x 2  Neuromuscular re-education:  Heel toe raises x 10 (cues for home use) Modified tandem stance in walker 15 seconds x 6 bilateral Side stepping bilateral hands on bar 10 ft x 2 each way   PATIENT EDUCATION:  03/08/2022 Education details: HEP progression Person educated: Patient Education method: Consulting civil engineer, Media planner, Verbal cues, and Handouts Education comprehension: verbalized understanding, returned demonstration, and verbal cues required     HOME EXERCISE PROGRAM: Access Code: [email or web address] URL: [email or web address] Date: 03/08/2022 Prepared by: Scot Jun  Exercises - Seated March  - 1-2 x daily - 7 x weekly - 1-2 sets - 10 reps - Sit to Stand  - 2-3 x daily - 7 x weekly - 1 sets - 5-10 reps - Seated Long Arc Quad  - 2-3 x daily - 7 x weekly - 1-2 sets - 10 reps - 2 hold - Standing Scapular Retraction  - 5 x daily - 7 x weekly -  1 sets - 5 reps - 5 second hold - Heel Toe Raises with Counter Support  - 1-2 x daily - 7 x weekly - 1-2 sets - 10 reps - Standing Tandem Balance with Counter Support  - 1-2 x daily - 7 x weekly - 1 sets - 10 reps   ASSESSMENT:   CLINICAL IMPRESSION: Ability to move Lt leg in functional movement still a big concern and limitation for safe and effective progressive mobility.  Pt has continued to show slow but steady improvement in quality of movements when compared to previous performance.      OBJECTIVE IMPAIRMENTS Abnormal gait, decreased activity tolerance, decreased balance, decreased endurance, decreased mobility, difficulty walking, decreased strength, impaired perceived functional ability, impaired flexibility, improper body mechanics, postural dysfunction, and pain.    ACTIVITY LIMITATIONS carrying, lifting, bending, standing, squatting, stairs, transfers, bed mobility, reach over head, and locomotion level   PARTICIPATION LIMITATIONS: meal prep,  cleaning, interpersonal relationship, driving, shopping, and community activity   PERSONAL FACTORS   History of 10/19/21 C3-4 C4-5 C5-6 ACDF with soft collar PMH bracial neuritis, abnormal gait, Degeneration of lumbar, HTN, gouty arthropathy, OA, lumbar laminectomy 2011, Rt TKA  are also affecting patient's functional outcome.    REHAB POTENTIAL: Good   CLINICAL DECISION MAKING: Stable/uncomplicated   EVALUATION COMPLEXITY: Low     GOALS: Goals reviewed with patient? Yes   Short term PT Goals (target date for Short term goals are 3 weeks 02/15/2022) Patient will demonstrate independent use of home exercise program to maintain progress from in clinic treatments. Goal status: MET   Long term PT goals (target dates for all long term goals are 10 weeks  04/05/2022 )   1. Patient will demonstrate/report pain at worst less than or equal to 2/10 to facilitate minimal limitation in daily activity secondary to pain symptoms. Goal status: on going assessed 03/08/2022   2. Patient will demonstrate independent use of home exercise program to facilitate ability to maintain/progress functional gains from skilled physical therapy services. Goal status: on going assessed 03/08/2022   3. Patient will demonstrate bilateral hip MMT 5/5 throughout to facilitate improved transfers, ambulation towards independence.  Goal status: on going assessed 03/08/2022   4.  Patient will demonstrate BERG testing > or = 45 to indicate reduced fall risk.  Goal status: on going assessed 03/08/2022   5.  Patient will demonstrate TUG c LRAD < or = 20 seconds to indicate reduced fall risk/ improved community ambulation.    Goal status: on going assessed 03/08/2022   6.  Patient will demonstrate ability to ambulation c LRAD (SPC, independent) community distances > 300 ft.   Goal status: on going assessed 03/08/2022       PLAN: PT FREQUENCY: 1-2x/week   PT DURATION: 10 weeks   PLANNED INTERVENTIONS: Therapeutic exercises,  Therapeutic activity, Neuro Muscular re-education, Balance training, Gait training, Patient/Family education, Joint mobilization, Stair training, DME instructions, Dry Needling, Electrical stimulation, Cryotherapy, Moist heat, Taping, Ultrasound, Ionotophoresis 80m/ml Dexamethasone, and Manual therapy.  All included unless contraindicated   PLAN FOR NEXT SESSION: Progress note in next visit or two with BERG reassessment.    MScot Jun PT, DPT, OCS, ATC 03/15/22  1:39 PM

## 2022-03-18 ENCOUNTER — Encounter: Payer: Self-pay | Admitting: Rehabilitative and Restorative Service Providers"

## 2022-03-18 ENCOUNTER — Other Ambulatory Visit: Payer: Self-pay | Admitting: Internal Medicine

## 2022-03-18 ENCOUNTER — Ambulatory Visit (INDEPENDENT_AMBULATORY_CARE_PROVIDER_SITE_OTHER): Payer: PPO | Admitting: Rehabilitative and Restorative Service Providers"

## 2022-03-18 DIAGNOSIS — M542 Cervicalgia: Secondary | ICD-10-CM

## 2022-03-18 DIAGNOSIS — M545 Low back pain, unspecified: Secondary | ICD-10-CM | POA: Diagnosis not present

## 2022-03-18 DIAGNOSIS — M961 Postlaminectomy syndrome, not elsewhere classified: Secondary | ICD-10-CM

## 2022-03-18 DIAGNOSIS — M170 Bilateral primary osteoarthritis of knee: Secondary | ICD-10-CM

## 2022-03-18 DIAGNOSIS — M6281 Muscle weakness (generalized): Secondary | ICD-10-CM | POA: Diagnosis not present

## 2022-03-18 DIAGNOSIS — G8929 Other chronic pain: Secondary | ICD-10-CM | POA: Diagnosis not present

## 2022-03-18 DIAGNOSIS — R293 Abnormal posture: Secondary | ICD-10-CM

## 2022-03-18 DIAGNOSIS — R2689 Other abnormalities of gait and mobility: Secondary | ICD-10-CM | POA: Diagnosis not present

## 2022-03-18 DIAGNOSIS — M47817 Spondylosis without myelopathy or radiculopathy, lumbosacral region: Secondary | ICD-10-CM

## 2022-03-18 NOTE — Therapy (Signed)
OUTPATIENT PHYSICAL THERAPY TREATMENT NOTE   Patient Name: Claudia [name] MRN: [id number] DOB:1933/10/19, 88 y.o., female Today's Date: 03/18/2022   END OF SESSION:   PT End of Session - 03/18/22 1605     Visit Number 9    Number of Visits 20    Date for PT Re-Evaluation 04/05/22    Authorization Type [employer] $15 copay    Progress Note Due on Visit 10    PT Start Time 7579    PT Stop Time 1636    PT Time Calculation (min) 41 min    Activity Tolerance Patient tolerated treatment well;Patient limited by fatigue    Behavior During Therapy WFL for tasks assessed/performed                   Past Medical History:  Diagnosis Date   Abnormality of gait    Brachial neuritis or radiculitis NOS    Degeneration of lumbar or lumbosacral intervertebral disc    Dysrhythmia    Essential hypertension, benign    Gouty arthropathy    Lumbago    Obesity    Osteoarthritis    Pure hypercholesterolemia    Unspecified hypothyroidism    Past Surgical History:  Procedure Laterality Date   ABDOMINAL HYSTERECTOMY  03/13/2010   ANTERIOR CERVICAL DECOMP/DISCECTOMY FUSION N/A 10/19/2021   Procedure: C3-4, C4-5, C5-6 ANTERIOR CERVICAL DISCECTOMY FUSION, ALLOGRAFT, PLATE;  Surgeon: [name], MD;  Location: [facility];  Service: Orthopedics;  Laterality: N/A;   BACK SURGERY     LUMBAR LAMINECTOMY  03/13/2010   right knee replacement  03/13/2010   Patient Active Problem List   Diagnosis Date Noted   Deficiency anemia 02/15/2022   Vitamin B12 deficiency neuropathy ([place]) 02/15/2022   Hypercalcemia 02/15/2022   S/P cervical spinal fusion 11/10/2021   Cervical stricture or stenosis 10/20/2021   Cervical spinal stenosis 10/19/2021   Meningioma ([place]) 08/24/2021   PAF (paroxysmal atrial fibrillation) ([place]) 11/03/2020   Stage 3b chronic kidney disease ([place]) 05/06/2020   Chronic left-sided low back pain without sciatica 12/25/2018   Spinal stenosis in cervical region 05/12/2016    Therapeutic opioid-induced constipation ([facility]) 01/01/2016   Chronic idiopathic constipation 09/11/2013   Lumbosacral spondylosis without myelopathy 04/09/2013   Postlaminectomy syndrome, lumbar region 04/09/2013   Anticoagulation management encounter 02/01/2012   Neuropathy, peripheral 08/04/2011   Pure hypercholesterolemia 08/04/2011   Essential hypertension, benign 08/04/2011   Hypothyroidism 08/04/2011   DJD (degenerative joint disease) of knee 08/04/2011   Gout 08/04/2011      THERAPY DIAG:  Cervicalgia  Other abnormalities of gait and mobility  Muscle weakness (generalized)  Abnormal posture  Chronic bilateral low back pain without sciatica  PCP: Janith Lima. MD   REFERRING PROVIDER: [name], MD   REFERRING DIAG: Z98.1 (ICD-10-CM) - S/P cervical spinal fusion    Rationale for Evaluation and Treatment Rehabilitation   ONSET DATE: Dec 2022   SUBJECTIVE:  SUBJECTIVE STATEMENT: No complaints of pain.  Reported tired at times.    PERTINENT HISTORY:  History of 10/19/21 C3-4 C4-5 C5-6 ACDF with soft collar PMH bracial neuritis, abnormal gait, Degeneration of lumbar, HTN, gouty arthropathy, OA, lumbar laminectomy 2011, Rt TKA   PAIN:  No pain upon arrival.    PRECAUTIONS: None   WEIGHT BEARING RESTRICTIONS No   FALLS:  Has patient fallen in last 6 months? Maybe 1 fall in last 6 months.  Several falls within last year.    LIVING ENVIRONMENT: Lives with:lives alone Lives in: House/apartment Stairs: 3 steps to enter deck, rail on Lt going up.  No stairs in house.  Has following equipment at home: FWW, cane   OCCUPATION: retired    PLOF: Independent  , cooking/cleaning, shopping/grocery store   [place] stronger, walking independently.      OBJECTIVE:    PATIENT SURVEYS:  01/25/2022 No foto - incorrect set up    COGNITION: 01/25/2022 Overall cognitive status: Within functional limits for tasks assessed     SENSATION: 01/25/2022 not tested today   POSTURE:  01/25/2022 rounded shoulders, forward head, increased thoracic kyphosis, and flexed trunk    PALPATION: 01/25/2022 no specific tenderness noted in screening for balance.       MMT:            01/25/2022:  Seated resisted static testing hip abduction Rt: strong/painless. Lt moderate strength, painless (held MMT due to difficulty in bed mobility positioning MMT Right 01/25/2022 Left 01/25/2022 Left 02/22/2022 Left 03/11/2022  Shoulder flexion        Shoulder extension        Shoulder abduction        Shoulder adduction        Shoulder extension        Shoulder internal rotation        Shoulder external rotation                                                     Hip flexion 5/5 4/5 4/5 4/5  Hip extension        Hip abduction        Knee flexion 5/5 5/5    Knee extension  5/5 5/5    Ankle DF 5/5 4/5  4/5   (Blank rows = not tested)   CERVICAL SPECIAL TESTS:  01/25/2022 none performed   FUNCTIONAL TESTS:  03/08/2022:   TUG:  41.76 seconds c FWW  01/25/2022        TUG:   71 seconds c FWW     [facility] PT Assessment - 01/25/22 0001                Standardized Balance Assessment    Standardized Balance Assessment Berg Balance Test          Berg Balance Test    Sit to Stand Able to stand using hands after several tries     Standing Unsupported Able to stand 2 minutes with supervision     Sitting with Back Unsupported but Feet Supported on Floor or Stool Able to sit safely and securely 2 minutes     Stand to Sit Uses backs of legs against chair to control descent     Transfers Able to transfer safely, definite need of hands  Standing Unsupported with Eyes Closed Able to stand 3 seconds     Standing Unsupported with Feet Together Needs help to attain  position but able to stand for 30 seconds with feet together     From Standing, Reach Forward with Outstretched Arm Can reach forward >5 cm safely (2")     From Standing Position, Pick up Object from Floor Unable to pick up and needs supervision     From Standing Position, Turn to Look Behind Over each Shoulder Needs assist to keep from losing balance and falling     Turn 360 Degrees Needs assistance while turning     Standing Unsupported, Alternately Place Feet on Step/Stool Needs assistance to keep from falling or unable to try     Standing Unsupported, One Foot in Front Needs help to step but can hold 15 seconds     Standing on One Leg Unable to try or needs assist to prevent fall     Total Score 21                 GAIT ASSESSMENT: 03/18/2022:  FWW into clinic c mod independent, decreased gait speed.  SPC use in Rt UE c CGA 33 ft today c cues for sequencing.    TODAY'S TREATMENT:  03/18/2022: Therex: Nustep Lvl 6 2.5 mins, lvl 5 9.5 mins (12 mins total) UE/LE Seated LAQ 3 lbs x 15 bilateral Seated clam shell blue band x 15 each LE (given for HEP)   TherActivity: SPC use c instruction of sequencing during performance c CGA.  Cane use in Rt UE.  Ambulation 33 ft prior to fatigue.  Performed again later in visit c CGA, sequencing provided verbally 35 ft Repetitive fwd stepping practice c cane in Rt UE c verbal cues (leading with Lt leg) x 10 Sit to stand to sit c control focus 23 inch table height 2 x 5  03/15/2022 Therex: Seated LAQ 2 x 10 bilateral 3 lb weight Seated Lt quad set 5 sec hold x 10  Nu step Lvl 6 8 mins UE/LE  Neuromuscular re-education:  BIG inspired stepping fwd c CGA with single arm on rail x 10 bilateral (slow movement pattern required additional time for completion - improved compared to last visit) BIG inspired stepping reverse c CGA with single arm on rail x 10 bilateral (slow movement pattern required additional time for completion) Alt foot tapping on 4  inch step x 10 bilateral with variable single hand and double hand support c CGA  03/11/2022 Therex: Nu step Lvl 5 12 mins UE/LE Seated marching alternating unsupported x 10 Rt Seated Lt Quad set 5 sec hold x 10  FWW ambulation 100 ft x 2 c supervision   Neuromuscular re-education:  BIG inspired stepping fwd c CGA with single arm on rail x 10 bilateral (slow movement pattern required additional time for completion Alt foot tapping on 4 inch step x 10 bilateral with variable single hand and double hand support c CGA  03/08/2022 Therex: Nu step Lvl 5 12 mins UE/LE Seated LAQ 3 lbs 2 x 10 bilateral   TherActivity for sit to stand, stairs, gait: 18 inch chair x5 c hands c slow lowering focus, no hand assist for lowering TUG x 1 Ambulation c FWW 50 ft x 2  Neuromuscular re-education:  Heel toe raises x 10 (cues for home use) Modified tandem stance in walker 15 seconds x 6 bilateral Side stepping bilateral hands on bar 10 ft x 2 each way  PATIENT EDUCATION:  03/08/2022 Education details: HEP progression Person educated: Patient Education method: Consulting civil engineer, Media planner, Verbal cues, and Handouts Education comprehension: verbalized understanding, returned demonstration, and verbal cues required     HOME EXERCISE PROGRAM: Access Code: [email or web address] URL: [email or web address] Date: 03/08/2022 Prepared by: Scot Jun  Exercises - Seated March  - 1-2 x daily - 7 x weekly - 1-2 sets - 10 reps - Sit to Stand  - 2-3 x daily - 7 x weekly - 1 sets - 5-10 reps - Seated Long Arc Quad  - 2-3 x daily - 7 x weekly - 1-2 sets - 10 reps - 2 hold - Standing Scapular Retraction  - 5 x daily - 7 x weekly - 1 sets - 5 reps - 5 second hold - Heel Toe Raises with Counter Support  - 1-2 x daily - 7 x weekly - 1-2 sets - 10 reps - Standing Tandem Balance with Counter Support  - 1-2 x daily - 7 x weekly - 1 sets - 10 reps   ASSESSMENT:   CLINICAL IMPRESSION: Discussed with  patient about goals for therapy and relative status (progress made but not achieved).  Introduced use of cane with CGA to min A at times. Reassessment next visit for full balance assessment and LTG updates.  Continued skilled PT services indicated at this time due to impairments.      OBJECTIVE IMPAIRMENTS Abnormal gait, decreased activity tolerance, decreased balance, decreased endurance, decreased mobility, difficulty walking, decreased strength, impaired perceived functional ability, impaired flexibility, improper body mechanics, postural dysfunction, and pain.    ACTIVITY LIMITATIONS carrying, lifting, bending, standing, squatting, stairs, transfers, bed mobility, reach over head, and locomotion level   PARTICIPATION LIMITATIONS: meal prep, cleaning, interpersonal relationship, driving, shopping, and community activity   PERSONAL FACTORS   History of 10/19/21 C3-4 C4-5 C5-6 ACDF with soft collar PMH bracial neuritis, abnormal gait, Degeneration of lumbar, HTN, gouty arthropathy, OA, lumbar laminectomy 2011, Rt TKA  are also affecting patient's functional outcome.    REHAB POTENTIAL: Good   CLINICAL DECISION MAKING: Stable/uncomplicated   EVALUATION COMPLEXITY: Low     GOALS: Goals reviewed with patient? Yes   Short term PT Goals (target date for Short term goals are 3 weeks 02/15/2022) Patient will demonstrate independent use of home exercise program to maintain progress from in clinic treatments. Goal status: MET   Long term PT goals (target dates for all long term goals are 10 weeks  04/05/2022 )   1. Patient will demonstrate/report pain at worst less than or equal to 2/10 to facilitate minimal limitation in daily activity secondary to pain symptoms. Goal status: on going assessed 03/08/2022   2. Patient will demonstrate independent use of home exercise program to facilitate ability to maintain/progress functional gains from skilled physical therapy services. Goal status: on going  assessed 03/08/2022   3. Patient will demonstrate bilateral hip MMT 5/5 throughout to facilitate improved transfers, ambulation towards independence.  Goal status: on going assessed 03/08/2022   4.  Patient will demonstrate BERG testing > or = 45 to indicate reduced fall risk.  Goal status: on going assessed 03/08/2022   5.  Patient will demonstrate TUG c LRAD < or = 20 seconds to indicate reduced fall risk/ improved community ambulation.    Goal status: on going assessed 03/08/2022   6.  Patient will demonstrate ability to ambulation c LRAD (SPC, independent) community distances > 300 ft.   Goal status: on going assessed 03/08/2022  PLAN: PT FREQUENCY: 1-2x/week   PT DURATION: 10 weeks   PLANNED INTERVENTIONS: Therapeutic exercises, Therapeutic activity, Neuro Muscular re-education, Balance training, Gait training, Patient/Family education, Joint mobilization, Stair training, DME instructions, Dry Needling, Electrical stimulation, Cryotherapy, Moist heat, Taping, Ultrasound, Ionotophoresis 36m/ml Dexamethasone, and Manual therapy.  All included unless contraindicated   PLAN FOR NEXT SESSION: Progress note c BERG.    MScot Jun PT, DPT, OCS, ATC 03/18/22  4:42 PM

## 2022-03-22 ENCOUNTER — Ambulatory Visit (INDEPENDENT_AMBULATORY_CARE_PROVIDER_SITE_OTHER): Payer: PPO

## 2022-03-22 ENCOUNTER — Encounter: Payer: Self-pay | Admitting: Physical Therapy

## 2022-03-22 ENCOUNTER — Ambulatory Visit (INDEPENDENT_AMBULATORY_CARE_PROVIDER_SITE_OTHER): Payer: PPO | Admitting: Physical Therapy

## 2022-03-22 DIAGNOSIS — R2689 Other abnormalities of gait and mobility: Secondary | ICD-10-CM | POA: Diagnosis not present

## 2022-03-22 DIAGNOSIS — G8929 Other chronic pain: Secondary | ICD-10-CM | POA: Diagnosis not present

## 2022-03-22 DIAGNOSIS — Z23 Encounter for immunization: Secondary | ICD-10-CM | POA: Diagnosis not present

## 2022-03-22 DIAGNOSIS — M6281 Muscle weakness (generalized): Secondary | ICD-10-CM

## 2022-03-22 DIAGNOSIS — R293 Abnormal posture: Secondary | ICD-10-CM | POA: Diagnosis not present

## 2022-03-22 DIAGNOSIS — G63 Polyneuropathy in diseases classified elsewhere: Secondary | ICD-10-CM | POA: Diagnosis not present

## 2022-03-22 DIAGNOSIS — E538 Deficiency of other specified B group vitamins: Secondary | ICD-10-CM

## 2022-03-22 DIAGNOSIS — M545 Low back pain, unspecified: Secondary | ICD-10-CM | POA: Diagnosis not present

## 2022-03-22 DIAGNOSIS — M542 Cervicalgia: Secondary | ICD-10-CM

## 2022-03-22 MED ORDER — CYANOCOBALAMIN 1000 MCG/ML IJ SOLN
1000.0000 ug | Freq: Once | INTRAMUSCULAR | Status: AC
  Administered 2022-03-22: 1000 ug via INTRAMUSCULAR

## 2022-03-22 NOTE — Progress Notes (Signed)
After obtaining consent, and per orders of Dr. Ronnald Ramp, injection of the Flu and B12 given in the right deltoid  by Marrian Salvage. Patient tolerated well and instructed to report any adverse reaction to me immediately.

## 2022-03-22 NOTE — Therapy (Signed)
OUTPATIENT PHYSICAL THERAPY TREATMENT NOTE/Progress note Progress Note reporting period 01/25/22 to 03/22/22  See below for objective and subjective measurements relating to patients progress with PT.    Patient Name: Claudia [name] MRN: [id number] DOB:July 07, 1933, 88 y.o., female Today's Date: 03/22/2022   END OF SESSION:   PT End of Session - 03/22/22 1258     Visit Number 10    Number of Visits 20    Date for PT Re-Evaluation 04/05/22    Authorization Type [employer] $15 copay    Progress Note Due on Visit 20    PT Start Time 1258    PT Stop Time 1340    PT Time Calculation (min) 42 min    Activity Tolerance Patient tolerated treatment well;Patient limited by fatigue    Behavior During Therapy WFL for tasks assessed/performed                   Past Medical History:  Diagnosis Date   Abnormality of gait    Brachial neuritis or radiculitis NOS    Degeneration of lumbar or lumbosacral intervertebral disc    Dysrhythmia    Essential hypertension, benign    Gouty arthropathy    Lumbago    Obesity    Osteoarthritis    Pure hypercholesterolemia    Unspecified hypothyroidism    Past Surgical History:  Procedure Laterality Date   ABDOMINAL HYSTERECTOMY  03/13/2010   ANTERIOR CERVICAL DECOMP/DISCECTOMY FUSION N/A 10/19/2021   Procedure: C3-4, C4-5, C5-6 ANTERIOR CERVICAL DISCECTOMY FUSION, ALLOGRAFT, PLATE;  Surgeon: [name], MD;  Location: [facility];  Service: Orthopedics;  Laterality: N/A;   BACK SURGERY     LUMBAR LAMINECTOMY  03/13/2010   right knee replacement  03/13/2010   Patient Active Problem List   Diagnosis Date Noted   Deficiency anemia 02/15/2022   Vitamin B12 deficiency neuropathy ([place]) 02/15/2022   Hypercalcemia 02/15/2022   S/P cervical spinal fusion 11/10/2021   Cervical stricture or stenosis 10/20/2021   Cervical spinal stenosis 10/19/2021   Meningioma ([place]) 08/24/2021   PAF (paroxysmal atrial fibrillation) ([place]) 11/03/2020   Stage 3b  chronic kidney disease ([place]) 05/06/2020   Chronic left-sided low back pain without sciatica 12/25/2018   Spinal stenosis in cervical region 05/12/2016   Therapeutic opioid-induced constipation ([facility]) 01/01/2016   Chronic idiopathic constipation 09/11/2013   Lumbosacral spondylosis without myelopathy 04/09/2013   Postlaminectomy syndrome, lumbar region 04/09/2013   Anticoagulation management encounter 02/01/2012   Neuropathy, peripheral 08/04/2011   Pure hypercholesterolemia 08/04/2011   Essential hypertension, benign 08/04/2011   Hypothyroidism 08/04/2011   DJD (degenerative joint disease) of knee 08/04/2011   Gout 08/04/2011      THERAPY DIAG:  Cervicalgia  Other abnormalities of gait and mobility  Muscle weakness (generalized)  Abnormal posture  Chronic bilateral low back pain without sciatica  PCP: Janith Lima. MD   REFERRING PROVIDER: [name], MD   REFERRING DIAG: Z98.1 (ICD-10-CM) - S/P cervical spinal fusion    Rationale for Evaluation and Treatment Rehabilitation   ONSET DATE: Dec 2022   SUBJECTIVE:  SUBJECTIVE STATEMENT: No complaints of pain upon arrival, She feels her endurance is improving some and her balance, she takes some steps without her RW but has it close in front of her. She feels she has improved 30% with PT up to this point.   PERTINENT HISTORY:  History of 10/19/21 C3-4 C4-5 C5-6 ACDF with soft collar PMH bracial neuritis, abnormal gait, Degeneration of lumbar, HTN, gouty arthropathy, OA, lumbar laminectomy 2011, Rt TKA   PAIN:  No pain upon arrival.    PRECAUTIONS: None   WEIGHT BEARING RESTRICTIONS No   FALLS:  Has patient fallen in last 6 months? Maybe 1 fall in last 6 months.  Several falls within last year.    LIVING  ENVIRONMENT: Lives with:lives alone Lives in: House/apartment Stairs: 3 steps to enter deck, rail on Lt going up.  No stairs in house.  Has following equipment at home: FWW, cane   OCCUPATION: retired    PLOF: Independent  , cooking/cleaning, shopping/grocery store   [place] stronger, walking independently.     OBJECTIVE:    PATIENT SURVEYS:  01/25/2022 No foto - incorrect set up    COGNITION: 01/25/2022 Overall cognitive status: Within functional limits for tasks assessed     SENSATION: 01/25/2022 not tested today   POSTURE:  01/25/2022 rounded shoulders, forward head, increased thoracic kyphosis, and flexed trunk    PALPATION: 01/25/2022 no specific tenderness noted in screening for balance.       MMT:            01/25/2022:  Seated resisted static testing hip abduction Rt: strong/painless. Lt moderate strength, painless (held MMT due to difficulty in bed mobility positioning MMT Right 01/25/2022 Left 01/25/2022 Left 02/22/2022 Left 03/11/2022 Left 03/16/22  Shoulder flexion         Shoulder extension         Shoulder abduction         Shoulder adduction         Shoulder extension         Shoulder internal rotation         Shoulder external rotation                                                           Hip flexion 5/5 4/5 4/5 4/5 4/5  Hip extension         Hip abduction         Knee flexion 5/5 5/5     Knee extension  5/5 5/5     Ankle DF 5/5 4/5  4/5 4+/5   (Blank rows = not tested)   CERVICAL SPECIAL TESTS:  01/25/2022 none performed   FUNCTIONAL TESTS:  03/22/22: TUG 44 seconds C FWW (done at very end of session)  03/08/2022:   TUG:  41.76 seconds c FWW  01/25/2022        TUG:   71 seconds c FWW     [facility] PT Assessment - 01/25/22 0001                Standardized Balance Assessment    Standardized Balance Assessment Berg Balance Test          Berg Balance Test    Sit to Stand Able to stand using hands after several tries  Standing  Unsupported Able to stand 2 minutes with supervision     Sitting with Back Unsupported but Feet Supported on Floor or Stool Able to sit safely and securely 2 minutes     Stand to Sit Uses backs of legs against chair to control descent     Transfers Able to transfer safely, definite need of hands     Standing Unsupported with Eyes Closed Able to stand 3 seconds     Standing Unsupported with Feet Together Needs help to attain position but able to stand for 30 seconds with feet together     From Standing, Reach Forward with Outstretched Arm Can reach forward >5 cm safely (2")     From Standing Position, Pick up Object from Floor Unable to pick up and needs supervision     From Standing Position, Turn to Look Behind Over each Shoulder Needs assist to keep from losing balance and falling     Turn 360 Degrees Needs assistance while turning     Standing Unsupported, Alternately Place Feet on Step/Stool Needs assistance to keep from falling or unable to try     Standing Unsupported, One Foot in Front Needs help to step but can hold 15 seconds     Standing on One Leg Unable to try or needs assist to prevent fall     Total Score 21               [facility] PT Assessment - 03/22/22 0001       Standardized Balance Assessment   Standardized Balance Assessment Berg Balance Test      Berg Balance Test   Sit to Stand Able to stand  independently using hands    Standing Unsupported Able to stand 2 minutes with supervision    Sitting with Back Unsupported but Feet Supported on Floor or Stool Able to sit safely and securely 2 minutes    Stand to Sit Controls descent by using hands    Transfers Able to transfer safely, definite need of hands    Standing Unsupported with Eyes Closed Able to stand 10 seconds with supervision    Standing Unsupported with Feet Together Able to place feet together independently and stand for 1 minute with supervision    From Standing, Reach Forward with Outstretched Arm Can reach  forward >12 cm safely (5")    From Standing Position, Pick up Object from Floor Able to pick up shoe, needs supervision    From Standing Position, Turn to Look Behind Over each Shoulder Turn sideways only but maintains balance    Turn 360 Degrees Needs close supervision or verbal cueing    Standing Unsupported, Alternately Place Feet on Step/Stool Needs assistance to keep from falling or unable to try    Standing Unsupported, One Foot in Front Needs help to step but can hold 15 seconds    Standing on One Leg Unable to try or needs assist to prevent fall    Total Score 32                GAIT ASSESSMENT: 03/18/2022:  FWW into clinic c mod independent, decreased gait speed.  SPC use in Rt UE c CGA 33 ft today c cues for sequencing.    TODAY'S TREATMENT:  03/22/22 -Nu step L6 X 3 min, then L5 for another 7 min (10 min total) -BERG physical performance test, see above for details -  03/18/2022: Therex: Nustep Lvl 6 2.5 mins, lvl 5 9.5 mins (12 mins  total) UE/LE Seated LAQ 3 lbs x 15 bilateral Seated clam shell blue band x 15 each LE (given for HEP)   TherActivity: SPC use c instruction of sequencing during performance c CGA.  Cane use in Rt UE.  Ambulation 33 ft prior to fatigue.  Performed again later in visit c CGA, sequencing provided verbally 35 ft Repetitive fwd stepping practice c cane in Rt UE c verbal cues (leading with Lt leg) x 10 Sit to stand to sit c control focus 23 inch table height 2 x 5  03/15/2022 Therex: Seated LAQ 2 x 10 bilateral 3 lb weight Seated Lt quad set 5 sec hold x 10  Nu step Lvl 6 8 mins UE/LE  Neuromuscular re-education:  BIG inspired stepping fwd c CGA with single arm on rail x 10 bilateral (slow movement pattern required additional time for completion - improved compared to last visit) BIG inspired stepping reverse c CGA with single arm on rail x 10 bilateral (slow movement pattern required additional time for completion) Alt foot tapping on 4  inch step x 10 bilateral with variable single hand and double hand support c CGA   PATIENT EDUCATION:  03/08/2022 Education details: HEP progression Person educated: Patient Education method: Consulting civil engineer, Media planner, Verbal cues, and Handouts Education comprehension: verbalized understanding, returned demonstration, and verbal cues required     HOME EXERCISE PROGRAM: Access Code: [email or web address] URL: [email or web address] Date: 03/08/2022 Prepared by: Scot Jun  Exercises - Seated March  - 1-2 x daily - 7 x weekly - 1-2 sets - 10 reps - Sit to Stand  - 2-3 x daily - 7 x weekly - 1 sets - 5-10 reps - Seated Long Arc Quad  - 2-3 x daily - 7 x weekly - 1-2 sets - 10 reps - 2 hold - Standing Scapular Retraction  - 5 x daily - 7 x weekly - 1 sets - 5 reps - 5 second hold - Heel Toe Raises with Counter Support  - 1-2 x daily - 7 x weekly - 1-2 sets - 10 reps - Standing Tandem Balance with Counter Support  - 1-2 x daily - 7 x weekly - 1 sets - 10 reps   ASSESSMENT:   CLINICAL IMPRESSION: She has made overall progress with PT, see updated goals and measurements above. She still has significant leg weakness Lt more weaker than Right, as well as gait and balance deficits limiting her functional abilities. PT recommending to continue current plan of care as she remains motivated and is making progress.      OBJECTIVE IMPAIRMENTS Abnormal gait, decreased activity tolerance, decreased balance, decreased endurance, decreased mobility, difficulty walking, decreased strength, impaired perceived functional ability, impaired flexibility, improper body mechanics, postural dysfunction, and pain.    ACTIVITY LIMITATIONS carrying, lifting, bending, standing, squatting, stairs, transfers, bed mobility, reach over head, and locomotion level   PARTICIPATION LIMITATIONS: meal prep, cleaning, interpersonal relationship, driving, shopping, and community activity   PERSONAL FACTORS   History of  10/19/21 C3-4 C4-5 C5-6 ACDF with soft collar PMH bracial neuritis, abnormal gait, Degeneration of lumbar, HTN, gouty arthropathy, OA, lumbar laminectomy 2011, Rt TKA  are also affecting patient's functional outcome.    REHAB POTENTIAL: Good   CLINICAL DECISION MAKING: Stable/uncomplicated   EVALUATION COMPLEXITY: Low     GOALS: Goals reviewed with patient? Yes   Short term PT Goals (target date for Short term goals are 3 weeks 02/15/2022) Patient will demonstrate independent use of home exercise program to maintain progress from in  clinic treatments. Goal status: MET   Long term PT goals (target dates for all long term goals are 10 weeks  04/05/2022 )   1. Patient will demonstrate/report pain at worst less than or equal to 2/10 to facilitate minimal limitation in daily activity secondary to pain symptoms. Goal status: on going assessed 03/08/2022   2. Patient will demonstrate independent use of home exercise program to facilitate ability to maintain/progress functional gains from skilled physical therapy services. Goal status: on going assessed 03/08/2022   3. Patient will demonstrate bilateral hip MMT 5/5 throughout to facilitate improved transfers, ambulation towards independence.  Goal status: on going assessed 03/08/2022   4.  Patient will demonstrate BERG testing > or = 45 to indicate reduced fall risk.  Goal status: on going assessed 03/08/2022   5.  Patient will demonstrate TUG c LRAD < or = 20 seconds to indicate reduced fall risk/ improved community ambulation.    Goal status: on going assessed 03/08/2022   6.  Patient will demonstrate ability to ambulation c LRAD (SPC, independent) community distances > 300 ft.   Goal status: on going assessed 03/08/2022       PLAN: PT FREQUENCY: 1-2x/week   PT DURATION: 10 weeks   PLANNED INTERVENTIONS: Therapeutic exercises, Therapeutic activity, Neuro Muscular re-education, Balance training, Gait training, Patient/Family education,  Joint mobilization, Stair training, DME instructions, Dry Needling, Electrical stimulation, Cryotherapy, Moist heat, Taping, Ultrasound, Ionotophoresis 40m/ml Dexamethasone, and Manual therapy.  All included unless contraindicated   PLAN FOR NEXT SESSION: leg strength, gait, balance, endurance as able.   BElsie Ra PT, DPT 03/22/22 12:59 PM

## 2022-03-25 ENCOUNTER — Ambulatory Visit: Payer: PPO | Admitting: Rehabilitative and Restorative Service Providers"

## 2022-03-25 ENCOUNTER — Encounter: Payer: Self-pay | Admitting: Rehabilitative and Restorative Service Providers"

## 2022-03-25 DIAGNOSIS — R293 Abnormal posture: Secondary | ICD-10-CM

## 2022-03-25 DIAGNOSIS — M47817 Spondylosis without myelopathy or radiculopathy, lumbosacral region: Secondary | ICD-10-CM | POA: Diagnosis not present

## 2022-03-25 DIAGNOSIS — M542 Cervicalgia: Secondary | ICD-10-CM | POA: Diagnosis not present

## 2022-03-25 DIAGNOSIS — R2689 Other abnormalities of gait and mobility: Secondary | ICD-10-CM

## 2022-03-25 DIAGNOSIS — M6281 Muscle weakness (generalized): Secondary | ICD-10-CM | POA: Diagnosis not present

## 2022-03-25 NOTE — Therapy (Signed)
OUTPATIENT PHYSICAL THERAPY TREATMENT NOTE  Patient Name: Claudia [name] MRN: [id number] DOB:1933-09-21, 88 y.o., female Today's Date: 03/25/2022   END OF SESSION:   PT End of Session - 03/25/22 1611     Visit Number 11    Number of Visits 20    Date for PT Re-Evaluation 04/05/22    Authorization Type [employer] $15 copay    Progress Note Due on Visit 20    PT Start Time 1600    PT Stop Time 1639    PT Time Calculation (min) 39 min    Activity Tolerance Patient tolerated treatment well;Patient limited by fatigue    Behavior During Therapy WFL for tasks assessed/performed                    Past Medical History:  Diagnosis Date   Abnormality of gait    Brachial neuritis or radiculitis NOS    Degeneration of lumbar or lumbosacral intervertebral disc    Dysrhythmia    Essential hypertension, benign    Gouty arthropathy    Lumbago    Obesity    Osteoarthritis    Pure hypercholesterolemia    Unspecified hypothyroidism    Past Surgical History:  Procedure Laterality Date   ABDOMINAL HYSTERECTOMY  03/13/2010   ANTERIOR CERVICAL DECOMP/DISCECTOMY FUSION N/A 10/19/2021   Procedure: C3-4, C4-5, C5-6 ANTERIOR CERVICAL DISCECTOMY FUSION, ALLOGRAFT, PLATE;  Surgeon: [name], MD;  Location: [facility];  Service: Orthopedics;  Laterality: N/A;   BACK SURGERY     LUMBAR LAMINECTOMY  03/13/2010   right knee replacement  03/13/2010   Patient Active Problem List   Diagnosis Date Noted   Deficiency anemia 02/15/2022   Vitamin B12 deficiency neuropathy ([place]) 02/15/2022   Hypercalcemia 02/15/2022   S/P cervical spinal fusion 11/10/2021   Cervical stricture or stenosis 10/20/2021   Cervical spinal stenosis 10/19/2021   Meningioma ([place]) 08/24/2021   PAF (paroxysmal atrial fibrillation) ([place]) 11/03/2020   Stage 3b chronic kidney disease ([place]) 05/06/2020   Chronic left-sided low back pain without sciatica 12/25/2018   Spinal stenosis in cervical region 05/12/2016    Therapeutic opioid-induced constipation ([facility]) 01/01/2016   Chronic idiopathic constipation 09/11/2013   Lumbosacral spondylosis without myelopathy 04/09/2013   Postlaminectomy syndrome, lumbar region 04/09/2013   Anticoagulation management encounter 02/01/2012   Neuropathy, peripheral 08/04/2011   Pure hypercholesterolemia 08/04/2011   Essential hypertension, benign 08/04/2011   Hypothyroidism 08/04/2011   DJD (degenerative joint disease) of knee 08/04/2011   Gout 08/04/2011      THERAPY DIAG:  Cervicalgia  Other abnormalities of gait and mobility  Muscle weakness (generalized)  Abnormal posture  PCP: Janith Lima. MD   REFERRING PROVIDER: [name], MD   REFERRING DIAG: Z98.1 (ICD-10-CM) - S/P cervical spinal fusion    Rationale for Evaluation and Treatment Rehabilitation   ONSET DATE: Dec 2022   SUBJECTIVE:  SUBJECTIVE STATEMENT: Pt indicated no specific changes, better or worse, since last visit.    PERTINENT HISTORY:  History of 10/19/21 C3-4 C4-5 C5-6 ACDF with soft collar PMH bracial neuritis, abnormal gait, Degeneration of lumbar, HTN, gouty arthropathy, OA, lumbar laminectomy 2011, Rt TKA   PAIN:  No pain upon arrival.    PRECAUTIONS: None   WEIGHT BEARING RESTRICTIONS No   FALLS:  Has patient fallen in last 6 months? Maybe 1 fall in last 6 months.  Several falls within last year.    LIVING ENVIRONMENT: Lives with:lives alone Lives in: House/apartment Stairs: 3 steps to enter deck, rail on Lt going up.  No stairs in house.  Has following equipment at home: FWW, cane   OCCUPATION: retired    PLOF: Independent  , cooking/cleaning, shopping/grocery store   [place] stronger, walking independently.     OBJECTIVE:    PATIENT SURVEYS:   01/25/2022 No foto - incorrect set up    COGNITION: 01/25/2022 Overall cognitive status: Within functional limits for tasks assessed     SENSATION: 01/25/2022 not tested today   POSTURE:  01/25/2022 rounded shoulders, forward head, increased thoracic kyphosis, and flexed trunk    PALPATION: 01/25/2022 no specific tenderness noted in screening for balance.       MMT:            01/25/2022:  Seated resisted static testing hip abduction Rt: strong/painless. Lt moderate strength, painless (held MMT due to difficulty in bed mobility positioning MMT Right 01/25/2022 Left 01/25/2022 Left 02/22/2022 Left 03/11/2022 Left 03/16/22  Shoulder flexion         Shoulder extension         Shoulder abduction         Shoulder adduction         Shoulder extension         Shoulder internal rotation         Shoulder external rotation                                                           Hip flexion 5/5 4/5 4/5 4/5 4/5  Hip extension         Hip abduction         Knee flexion 5/5 5/5     Knee extension  5/5 5/5     Ankle DF 5/5 4/5  4/5 4+/5   (Blank rows = not tested)   CERVICAL SPECIAL TESTS:  01/25/2022 none performed   FUNCTIONAL TESTS:  03/22/22: TUG 44 seconds C FWW (done at very end of session)    03/22/2022  Standardized Balance Assessment  Standardized Balance Assessment Berg Balance Test  Berg Balance Test  Sit to Stand 3  Standing Unsupported 3  Sitting with Back Unsupported but Feet Supported on Floor or Stool 4  Stand to Sit 3  Transfers 3  Standing Unsupported with Eyes Closed 3  Standing Unsupported with Feet Together 3  From Standing, Reach Forward with Outstretched Arm 3  From Standing Position, Pick up Object from Floor 3  From Standing Position, Turn to Look Behind Over each Shoulder 2  Turn 360 Degrees 1  Standing Unsupported, Alternately Place Feet on Step/Stool 0  Standing Unsupported, One Foot in Front 1  Standing on One Leg 0  Total Score 32    03/08/2022:   TUG:  41.76 seconds c FWW  01/25/2022        TUG:   71 seconds c FWW     [facility] PT Assessment - 01/25/22 0001                Standardized Balance Assessment    Standardized Balance Assessment Berg Balance Test          Berg Balance Test    Sit to Stand Able to stand using hands after several tries     Standing Unsupported Able to stand 2 minutes with supervision     Sitting with Back Unsupported but Feet Supported on Floor or Stool Able to sit safely and securely 2 minutes     Stand to Sit Uses backs of legs against chair to control descent     Transfers Able to transfer safely, definite need of hands     Standing Unsupported with Eyes Closed Able to stand 3 seconds     Standing Unsupported with Feet Together Needs help to attain position but able to stand for 30 seconds with feet together     From Standing, Reach Forward with Outstretched Arm Can reach forward >5 cm safely (2")     From Standing Position, Pick up Object from Floor Unable to pick up and needs supervision     From Standing Position, Turn to Look Behind Over each Shoulder Needs assist to keep from losing balance and falling     Turn 360 Degrees Needs assistance while turning     Standing Unsupported, Alternately Place Feet on Step/Stool Needs assistance to keep from falling or unable to try     Standing Unsupported, One Foot in Front Needs help to step but can hold 15 seconds     Standing on One Leg Unable to try or needs assist to prevent fall     Total Score 21                GAIT ASSESSMENT: 03/18/2022:  FWW into clinic c mod independent, decreased gait speed.  SPC use in Rt UE c CGA 33 ft today c cues for sequencing.    TODAY'S TREATMENT:  03/25/2022: Therex: Nustep Lvl 6 3.5 mins, Lvl 5 6.5 mins Lateral stepping c SPC in Rt UE, occasional Lt hand on bar, CGA 6 ft Lt and Rt x 3 each way Standing PF x 10 bilateral assist on bar with hands Standing DF x 10 bilateral assist on bar with  hands Standing marching toe tap on platform of treadmill x 5 bilateral CGA /min A with Rt hand on bar   TherActivity Repetitive fwd stepping practice c cane in Rt UE c verbal cues (leading with Lt leg) x 10 SPC use ambulation 30 ft c SBA c verbal cues for sequencing Leg press Double leg 43 lbs x 10 (cues to avoid hyperextension Lt knee), single leg Rt 2 x 10 37 lbs, single leg Lt 2 x 10 25 lbs   03/22/22 -Nu step L6 X 3 min, then L5 for another 7 min (10 min total) -BERG physical performance test, see above for details -  03/18/2022: Therex: Nustep Lvl 6 2.5 mins, lvl 5 9.5 mins (12 mins total) UE/LE Seated LAQ 3 lbs x 15 bilateral Seated clam shell blue band x 15 each LE (given for HEP)   TherActivity: SPC use c instruction of sequencing during performance c CGA.  Cane use in Rt UE.  Ambulation  33 ft prior to fatigue.  Performed again later in visit c CGA, sequencing provided verbally 35 ft Repetitive fwd stepping practice c cane in Rt UE c verbal cues (leading with Lt leg) x 10 Sit to stand to sit c control focus 23 inch table height 2 x 5  03/15/2022 Therex: Seated LAQ 2 x 10 bilateral 3 lb weight Seated Lt quad set 5 sec hold x 10  Nu step Lvl 6 8 mins UE/LE  Neuromuscular re-education:  BIG inspired stepping fwd c CGA with single arm on rail x 10 bilateral (slow movement pattern required additional time for completion - improved compared to last visit) BIG inspired stepping reverse c CGA with single arm on rail x 10 bilateral (slow movement pattern required additional time for completion) Alt foot tapping on 4 inch step x 10 bilateral with variable single hand and double hand support c CGA   PATIENT EDUCATION:  03/08/2022 Education details: HEP progression Person educated: Patient Education method: Consulting civil engineer, Media planner, Verbal cues, and Handouts Education comprehension: verbalized understanding, returned demonstration, and verbal cues required     HOME EXERCISE  PROGRAM: Access Code: [email or web address] URL: [email or web address] Date: 03/08/2022 Prepared by: Scot Jun  Exercises - Seated March  - 1-2 x daily - 7 x weekly - 1-2 sets - 10 reps - Sit to Stand  - 2-3 x daily - 7 x weekly - 1 sets - 5-10 reps - Seated Long Arc Quad  - 2-3 x daily - 7 x weekly - 1-2 sets - 10 reps - 2 hold - Standing Scapular Retraction  - 5 x daily - 7 x weekly - 1 sets - 5 reps - 5 second hold - Heel Toe Raises with Counter Support  - 1-2 x daily - 7 x weekly - 1-2 sets - 10 reps - Standing Tandem Balance with Counter Support  - 1-2 x daily - 7 x weekly - 1 sets - 10 reps   ASSESSMENT:   CLINICAL IMPRESSION: Slow but steady overall progression in tolerance to interventions with improving time standing and resistance difficulty changes on certain interventions while in clinic.  Quad and Lt leg weakness continue to impact functional movements, c times of difficulty lifting Lt leg and hyperextension moments at knee noted.      OBJECTIVE IMPAIRMENTS Abnormal gait, decreased activity tolerance, decreased balance, decreased endurance, decreased mobility, difficulty walking, decreased strength, impaired perceived functional ability, impaired flexibility, improper body mechanics, postural dysfunction, and pain.    ACTIVITY LIMITATIONS carrying, lifting, bending, standing, squatting, stairs, transfers, bed mobility, reach over head, and locomotion level   PARTICIPATION LIMITATIONS: meal prep, cleaning, interpersonal relationship, driving, shopping, and community activity   PERSONAL FACTORS   History of 10/19/21 C3-4 C4-5 C5-6 ACDF with soft collar PMH bracial neuritis, abnormal gait, Degeneration of lumbar, HTN, gouty arthropathy, OA, lumbar laminectomy 2011, Rt TKA  are also affecting patient's functional outcome.    REHAB POTENTIAL: Good   CLINICAL DECISION MAKING: Stable/uncomplicated   EVALUATION COMPLEXITY: Low     GOALS: Goals reviewed with patient?  Yes   Short term PT Goals (target date for Short term goals are 3 weeks 02/15/2022) Patient will demonstrate independent use of home exercise program to maintain progress from in clinic treatments. Goal status: MET   Long term PT goals (target dates for all long term goals are 10 weeks  04/05/2022 )   1. Patient will demonstrate/report pain at worst less than or equal to 2/10 to facilitate minimal limitation in daily activity  secondary to pain symptoms. Goal status: on going assessed 03/25/2022   2. Patient will demonstrate independent use of home exercise program to facilitate ability to maintain/progress functional gains from skilled physical therapy services. Goal status: on going assessed 03/25/2022   3. Patient will demonstrate bilateral hip MMT 5/5 throughout to facilitate improved transfers, ambulation towards independence.  Goal status:on going assessed 03/25/2022   4.  Patient will demonstrate BERG testing > or = 45 to indicate reduced fall risk.  Goal status: on going assessed 03/25/2022   5.  Patient will demonstrate TUG c LRAD < or = 20 seconds to indicate reduced fall risk/ improved community ambulation.    Goal status: on going assessed 03/25/2022   6.  Patient will demonstrate ability to ambulation c LRAD (SPC, independent) community distances > 300 ft.   Goal status: on going assessed 03/25/2022       PLAN: PT FREQUENCY: 1-2x/week   PT DURATION: 10 weeks   PLANNED INTERVENTIONS: Therapeutic exercises, Therapeutic activity, Neuro Muscular re-education, Balance training, Gait training, Patient/Family education, Joint mobilization, Stair training, DME instructions, Dry Needling, Electrical stimulation, Cryotherapy, Moist heat, Taping, Ultrasound, Ionotophoresis 4mg /ml Dexamethasone, and Manual therapy.  All included unless contraindicated   PLAN FOR NEXT SESSION: Leg press loading, balance interventions.   Scot Jun, PT, DPT, OCS, ATC 03/25/22  4:39 PM

## 2022-03-29 ENCOUNTER — Ambulatory Visit (INDEPENDENT_AMBULATORY_CARE_PROVIDER_SITE_OTHER): Payer: PPO | Admitting: Physical Therapy

## 2022-03-29 ENCOUNTER — Encounter: Payer: Self-pay | Admitting: Physical Therapy

## 2022-03-29 DIAGNOSIS — M6281 Muscle weakness (generalized): Secondary | ICD-10-CM

## 2022-03-29 DIAGNOSIS — G8929 Other chronic pain: Secondary | ICD-10-CM

## 2022-03-29 DIAGNOSIS — M545 Low back pain, unspecified: Secondary | ICD-10-CM | POA: Diagnosis not present

## 2022-03-29 DIAGNOSIS — R2689 Other abnormalities of gait and mobility: Secondary | ICD-10-CM | POA: Diagnosis not present

## 2022-03-29 DIAGNOSIS — M542 Cervicalgia: Secondary | ICD-10-CM

## 2022-03-29 DIAGNOSIS — R293 Abnormal posture: Secondary | ICD-10-CM

## 2022-03-29 NOTE — Therapy (Signed)
OUTPATIENT PHYSICAL THERAPY TREATMENT NOTE  Patient Name: CHANIQUA BRISBY MRN: [id number] DOB:Nov 08, 1933, 88 y.o., female Today's Date: 03/29/2022   END OF SESSION:   PT End of Session - 03/29/22 1309     Visit Number 12    Number of Visits 20    Date for PT Re-Evaluation 04/05/22    Authorization Type [employer] $15 copay    Progress Note Due on Visit 20    PT Start Time 1300    PT Stop Time 1344    PT Time Calculation (min) 44 min    Activity Tolerance Patient tolerated treatment well;Patient limited by fatigue    Behavior During Therapy WFL for tasks assessed/performed                    Past Medical History:  Diagnosis Date   Abnormality of gait    Brachial neuritis or radiculitis NOS    Degeneration of lumbar or lumbosacral intervertebral disc    Dysrhythmia    Essential hypertension, benign    Gouty arthropathy    Lumbago    Obesity    Osteoarthritis    Pure hypercholesterolemia    Unspecified hypothyroidism    Past Surgical History:  Procedure Laterality Date   ABDOMINAL HYSTERECTOMY  03/13/2010   ANTERIOR CERVICAL DECOMP/DISCECTOMY FUSION N/A 10/19/2021   Procedure: C3-4, C4-5, C5-6 ANTERIOR CERVICAL DISCECTOMY FUSION, ALLOGRAFT, PLATE;  Surgeon: [name], MD;  Location: [facility];  Service: Orthopedics;  Laterality: N/A;   BACK SURGERY     LUMBAR LAMINECTOMY  03/13/2010   right knee replacement  03/13/2010   Patient Active Problem List   Diagnosis Date Noted   Deficiency anemia 02/15/2022   Vitamin B12 deficiency neuropathy ([place]) 02/15/2022   Hypercalcemia 02/15/2022   S/P cervical spinal fusion 11/10/2021   Cervical stricture or stenosis 10/20/2021   Cervical spinal stenosis 10/19/2021   Meningioma ([place]) 08/24/2021   PAF (paroxysmal atrial fibrillation) ([place]) 11/03/2020   Stage 3b chronic kidney disease ([place]) 05/06/2020   Chronic left-sided low back pain without sciatica 12/25/2018   Spinal stenosis in cervical region 05/12/2016    Therapeutic opioid-induced constipation ([facility]) 01/01/2016   Chronic idiopathic constipation 09/11/2013   Lumbosacral spondylosis without myelopathy 04/09/2013   Postlaminectomy syndrome, lumbar region 04/09/2013   Anticoagulation management encounter 02/01/2012   Neuropathy, peripheral 08/04/2011   Pure hypercholesterolemia 08/04/2011   Essential hypertension, benign 08/04/2011   Hypothyroidism 08/04/2011   DJD (degenerative joint disease) of knee 08/04/2011   Gout 08/04/2011      THERAPY DIAG:  Cervicalgia  Other abnormalities of gait and mobility  Muscle weakness (generalized)  Abnormal posture  Chronic bilateral low back pain without sciatica  PCP: Janith Lima. MD   REFERRING PROVIDER: [name], MD   REFERRING DIAG: Z98.1 (ICD-10-CM) - S/P cervical spinal fusion    Rationale for Evaluation and Treatment Rehabilitation   ONSET DATE: Dec 2022   SUBJECTIVE:  SUBJECTIVE STATEMENT: Pt indicated no pain upon arrival, has been working on her exercises and trying to stand more for cooking and ADL's   PERTINENT HISTORY:  History of 10/19/21 C3-4 C4-5 C5-6 ACDF with soft collar PMH bracial neuritis, abnormal gait, Degeneration of lumbar, HTN, gouty arthropathy, OA, lumbar laminectomy 2011, Rt TKA   PAIN:  No pain upon arrival.    PRECAUTIONS: None   WEIGHT BEARING RESTRICTIONS No   FALLS:  Has patient fallen in last 6 months? Maybe 1 fall in last 6 months.  Several falls within last year.    LIVING ENVIRONMENT: Lives with:lives alone Lives in: House/apartment Stairs: 3 steps to enter deck, rail on Lt going up.  No stairs in house.  Has following equipment at home: FWW, cane   OCCUPATION: retired    PLOF: Independent  , cooking/cleaning, shopping/grocery  store   [place] stronger, walking independently.     OBJECTIVE:    PATIENT SURVEYS:  01/25/2022 No foto - incorrect set up    COGNITION: 01/25/2022 Overall cognitive status: Within functional limits for tasks assessed     SENSATION: 01/25/2022 not tested today   POSTURE:  01/25/2022 rounded shoulders, forward head, increased thoracic kyphosis, and flexed trunk    PALPATION: 01/25/2022 no specific tenderness noted in screening for balance.       MMT:            01/25/2022:  Seated resisted static testing hip abduction Rt: strong/painless. Lt moderate strength, painless (held MMT due to difficulty in bed mobility positioning MMT Right 01/25/2022 Left 01/25/2022 Left 02/22/2022 Left 03/11/2022 Left 03/16/22  Shoulder flexion         Shoulder extension         Shoulder abduction         Shoulder adduction         Shoulder extension         Shoulder internal rotation         Shoulder external rotation                                                           Hip flexion 5/5 4/5 4/5 4/5 4/5  Hip extension         Hip abduction         Knee flexion 5/5 5/5     Knee extension  5/5 5/5     Ankle DF 5/5 4/5  4/5 4+/5   (Blank rows = not tested)   CERVICAL SPECIAL TESTS:  01/25/2022 none performed   FUNCTIONAL TESTS:  03/22/22: TUG 44 seconds C FWW (done at very end of session)    03/22/2022  Standardized Balance Assessment  Standardized Balance Assessment Berg Balance Test  Berg Balance Test  Sit to Stand 3  Standing Unsupported 3  Sitting with Back Unsupported but Feet Supported on Floor or Stool 4  Stand to Sit 3  Transfers 3  Standing Unsupported with Eyes Closed 3  Standing Unsupported with Feet Together 3  From Standing, Reach Forward with Outstretched Arm 3  From Standing Position, Pick up Object from Floor 3  From Standing Position, Turn to Look Behind Over each Shoulder 2  Turn 360 Degrees 1  Standing Unsupported, Alternately Place Feet on  Step/Stool 0  Standing Unsupported, One  Foot in [place] 1  Standing on One Leg 0  Total Score 32   03/08/2022:   TUG:  41.76 seconds c FWW  01/25/2022        TUG:   71 seconds c FWW     [facility] PT Assessment - 01/25/22 0001                Standardized Balance Assessment    Standardized Balance Assessment Berg Balance Test          Berg Balance Test    Sit to Stand Able to stand using hands after several tries     Standing Unsupported Able to stand 2 minutes with supervision     Sitting with Back Unsupported but Feet Supported on Floor or Stool Able to sit safely and securely 2 minutes     Stand to Sit Uses backs of legs against chair to control descent     Transfers Able to transfer safely, definite need of hands     Standing Unsupported with Eyes Closed Able to stand 3 seconds     Standing Unsupported with Feet Together Needs help to attain position but able to stand for 30 seconds with feet together     From Standing, Reach Forward with Outstretched Arm Can reach forward >5 cm safely (2")     From Standing Position, Pick up Object from Floor Unable to pick up and needs supervision     From Standing Position, Turn to Look Behind Over each Shoulder Needs assist to keep from losing balance and falling     Turn 360 Degrees Needs assistance while turning     Standing Unsupported, Alternately Place Feet on Step/Stool Needs assistance to keep from falling or unable to try     Standing Unsupported, One Foot in Front Needs help to step but can hold 15 seconds     Standing on One Leg Unable to try or needs assist to prevent fall     Total Score 21                GAIT ASSESSMENT: 03/18/2022:  FWW into clinic c mod independent, decreased gait speed.  SPC use in Rt UE c CGA 33 ft today c cues for sequencing.    TODAY'S TREATMENT:  03/29/2022: Therex: Nustep Lvl 6 4 mins, Lvl 5 6  mins Lateral stepping c SPC in Rt UE, occasional Lt hand on bar, CGA 6 ft Lt and Rt x 3 each way Standing PF x  10 bilateral assist on bar with hands Standing DF x 10 bilateral assist on bar with hands Standing marching toe tap on 6 inch step X 10 bilat with Rt hand on bar   TherActivity Repetitive fwd stepping practice c cane in Rt UE c verbal cues (leading with Lt leg) x 10 SPC use ambulation 30 ft X 2 c SBA c verbal cues for sequencing (long rest break in between sets) Leg press Double leg 43 lbs x 12 (cues to avoid hyperextension Lt knee), single leg Rt 10 37 lbs, single leg Lt 2 x 10 25 lbs    03/25/2022: Therex: Nustep Lvl 6 3.5 mins, Lvl 5 6.5 mins Lateral stepping c SPC in Rt UE, occasional Lt hand on bar, CGA 6 ft Lt and Rt x 3 each way Standing PF x 10 bilateral assist on bar with hands Standing DF x 10 bilateral assist on bar with hands Standing marching toe tap on platform of treadmill x 5 bilateral CGA /min  A with Rt hand on bar   TherActivity Repetitive fwd stepping practice c cane in Rt UE c verbal cues (leading with Lt leg) x 10 SPC use ambulation 30 ft c SBA c verbal cues for sequencing Leg press Double leg 43 lbs x 10 (cues to avoid hyperextension Lt knee), single leg Rt 2 x 10 37 lbs, single leg Lt 2 x 10 25 lbs   03/22/22 -Nu step L6 X 3 min, then L5 for another 7 min (10 min total) -BERG physical performance test, see above for details -  03/18/2022: Therex: Nustep Lvl 6 2.5 mins, lvl 5 9.5 mins (12 mins total) UE/LE Seated LAQ 3 lbs x 15 bilateral Seated clam shell blue band x 15 each LE (given for HEP)   TherActivity: SPC use c instruction of sequencing during performance c CGA.  Cane use in Rt UE.  Ambulation 33 ft prior to fatigue.  Performed again later in visit c CGA, sequencing provided verbally 35 ft Repetitive fwd stepping practice c cane in Rt UE c verbal cues (leading with Lt leg) x 10 Sit to stand to sit c control focus 23 inch table height 2 x 5  03/15/2022 Therex: Seated LAQ 2 x 10 bilateral 3 lb weight Seated Lt quad set 5 sec hold x 10  Nu step Lvl  6 8 mins UE/LE  Neuromuscular re-education:  BIG inspired stepping fwd c CGA with single arm on rail x 10 bilateral (slow movement pattern required additional time for completion - improved compared to last visit) BIG inspired stepping reverse c CGA with single arm on rail x 10 bilateral (slow movement pattern required additional time for completion) Alt foot tapping on 4 inch step x 10 bilateral with variable single hand and double hand support c CGA   PATIENT EDUCATION:  03/08/2022 Education details: HEP progression Person educated: Patient Education method: Consulting civil engineer, Media planner, Verbal cues, and Handouts Education comprehension: verbalized understanding, returned demonstration, and verbal cues required     HOME EXERCISE PROGRAM: Access Code: [email or web address] URL: [email or web address] Date: 03/08/2022 Prepared by: Scot Jun  Exercises - Seated March  - 1-2 x daily - 7 x weekly - 1-2 sets - 10 reps - Sit to Stand  - 2-3 x daily - 7 x weekly - 1 sets - 5-10 reps - Seated Long Arc Quad  - 2-3 x daily - 7 x weekly - 1-2 sets - 10 reps - 2 hold - Standing Scapular Retraction  - 5 x daily - 7 x weekly - 1 sets - 5 reps - 5 second hold - Heel Toe Raises with Counter Support  - 1-2 x daily - 7 x weekly - 1-2 sets - 10 reps - Standing Tandem Balance with Counter Support  - 1-2 x daily - 7 x weekly - 1 sets - 10 reps   ASSESSMENT:   CLINICAL IMPRESSION: PT continued to work to improve overall leg strength, functional endurance, and gait within her tolerance. PT allows for rest breaks for her as needed due to fatigue.  PT recommending to continue current plan of care.     OBJECTIVE IMPAIRMENTS Abnormal gait, decreased activity tolerance, decreased balance, decreased endurance, decreased mobility, difficulty walking, decreased strength, impaired perceived functional ability, impaired flexibility, improper body mechanics, postural dysfunction, and pain.    ACTIVITY  LIMITATIONS carrying, lifting, bending, standing, squatting, stairs, transfers, bed mobility, reach over head, and locomotion level   PARTICIPATION LIMITATIONS: meal prep, cleaning, interpersonal relationship, driving, shopping, and community activity  PERSONAL FACTORS   History of 10/19/21 C3-4 C4-5 C5-6 ACDF with soft collar PMH bracial neuritis, abnormal gait, Degeneration of lumbar, HTN, gouty arthropathy, OA, lumbar laminectomy 2011, Rt TKA  are also affecting patient's functional outcome.    REHAB POTENTIAL: Good   CLINICAL DECISION MAKING: Stable/uncomplicated   EVALUATION COMPLEXITY: Low     GOALS: Goals reviewed with patient? Yes   Short term PT Goals (target date for Short term goals are 3 weeks 02/15/2022) Patient will demonstrate independent use of home exercise program to maintain progress from in clinic treatments. Goal status: MET   Long term PT goals (target dates for all long term goals are 10 weeks  04/05/2022 )   1. Patient will demonstrate/report pain at worst less than or equal to 2/10 to facilitate minimal limitation in daily activity secondary to pain symptoms. Goal status: on going assessed 03/25/2022   2. Patient will demonstrate independent use of home exercise program to facilitate ability to maintain/progress functional gains from skilled physical therapy services. Goal status: on going assessed 03/25/2022   3. Patient will demonstrate bilateral hip MMT 5/5 throughout to facilitate improved transfers, ambulation towards independence.  Goal status:on going assessed 03/25/2022   4.  Patient will demonstrate BERG testing > or = 45 to indicate reduced fall risk.  Goal status: on going assessed 03/25/2022   5.  Patient will demonstrate TUG c LRAD < or = 20 seconds to indicate reduced fall risk/ improved community ambulation.    Goal status: on going assessed 03/25/2022   6.  Patient will demonstrate ability to ambulation c LRAD (SPC, independent) community  distances > 300 ft.   Goal status: on going assessed 03/25/2022       PLAN: PT FREQUENCY: 1-2x/week   PT DURATION: 10 weeks   PLANNED INTERVENTIONS: Therapeutic exercises, Therapeutic activity, Neuro Muscular re-education, Balance training, Gait training, Patient/Family education, Joint mobilization, Stair training, DME instructions, Dry Needling, Electrical stimulation, Cryotherapy, Moist heat, Taping, Ultrasound, Ionotophoresis 62m/ml Dexamethasone, and Manual therapy.  All included unless contraindicated   PLAN FOR NEXT SESSION: Leg press loading, balance interventions, gait   BElsie Ra PT, DPT 03/29/22 1:42 PM

## 2022-04-01 ENCOUNTER — Ambulatory Visit: Payer: PPO | Admitting: Rehabilitative and Restorative Service Providers"

## 2022-04-01 ENCOUNTER — Encounter: Payer: Self-pay | Admitting: Rehabilitative and Restorative Service Providers"

## 2022-04-01 DIAGNOSIS — R293 Abnormal posture: Secondary | ICD-10-CM

## 2022-04-01 DIAGNOSIS — M542 Cervicalgia: Secondary | ICD-10-CM | POA: Diagnosis not present

## 2022-04-01 DIAGNOSIS — M6281 Muscle weakness (generalized): Secondary | ICD-10-CM | POA: Diagnosis not present

## 2022-04-01 DIAGNOSIS — R2689 Other abnormalities of gait and mobility: Secondary | ICD-10-CM

## 2022-04-01 NOTE — Therapy (Signed)
OUTPATIENT PHYSICAL THERAPY TREATMENT NOTE  Patient Name: Claudia [name] MRN: [id number] DOB:10/20/1933, 88 y.o., female Today's Date: 04/01/2022   END OF SESSION:   PT End of Session - 04/01/22 1604     Visit Number 13    Number of Visits 20    Date for PT Re-Evaluation 04/05/22    Authorization Type [employer] $15 copay    Progress Note Due on Visit 20    PT Start Time 1558    PT Stop Time 1637    PT Time Calculation (min) 39 min    Activity Tolerance Patient tolerated treatment well;Patient limited by fatigue    Behavior During Therapy WFL for tasks assessed/performed                     Past Medical History:  Diagnosis Date   Abnormality of gait    Brachial neuritis or radiculitis NOS    Degeneration of lumbar or lumbosacral intervertebral disc    Dysrhythmia    Essential hypertension, benign    Gouty arthropathy    Lumbago    Obesity    Osteoarthritis    Pure hypercholesterolemia    Unspecified hypothyroidism    Past Surgical History:  Procedure Laterality Date   ABDOMINAL HYSTERECTOMY  03/13/2010   ANTERIOR CERVICAL DECOMP/DISCECTOMY FUSION N/A 10/19/2021   Procedure: C3-4, C4-5, C5-6 ANTERIOR CERVICAL DISCECTOMY FUSION, ALLOGRAFT, PLATE;  Surgeon: [name], MD;  Location: [facility];  Service: Orthopedics;  Laterality: N/A;   BACK SURGERY     LUMBAR LAMINECTOMY  03/13/2010   right knee replacement  03/13/2010   Patient Active Problem List   Diagnosis Date Noted   Deficiency anemia 02/15/2022   Vitamin B12 deficiency neuropathy ([place]) 02/15/2022   Hypercalcemia 02/15/2022   S/P cervical spinal fusion 11/10/2021   Cervical stricture or stenosis 10/20/2021   Cervical spinal stenosis 10/19/2021   Meningioma ([place]) 08/24/2021   PAF (paroxysmal atrial fibrillation) ([place]) 11/03/2020   Stage 3b chronic kidney disease ([place]) 05/06/2020   Chronic left-sided low back pain without sciatica 12/25/2018   Spinal stenosis in cervical region 05/12/2016    Therapeutic opioid-induced constipation ([facility]) 01/01/2016   Chronic idiopathic constipation 09/11/2013   Lumbosacral spondylosis without myelopathy 04/09/2013   Postlaminectomy syndrome, lumbar region 04/09/2013   Anticoagulation management encounter 02/01/2012   Neuropathy, peripheral 08/04/2011   Pure hypercholesterolemia 08/04/2011   Essential hypertension, benign 08/04/2011   Hypothyroidism 08/04/2011   DJD (degenerative joint disease) of knee 08/04/2011   Gout 08/04/2011      THERAPY DIAG:  Cervicalgia  Other abnormalities of gait and mobility  Muscle weakness (generalized)  Abnormal posture  PCP: Janith Lima. MD   REFERRING PROVIDER: [name], MD   REFERRING DIAG: Z98.1 (ICD-10-CM) - S/P cervical spinal fusion    Rationale for Evaluation and Treatment Rehabilitation   ONSET DATE: Dec 2022   SUBJECTIVE:  SUBJECTIVE STATEMENT: No pain to report.  Pt indicated she is doing better at home.  Pt indicated walking around a bit more at home.  Reported she is cooking more.    PERTINENT HISTORY:  History of 10/19/21 C3-4 C4-5 C5-6 ACDF with soft collar PMH bracial neuritis, abnormal gait, Degeneration of lumbar, HTN, gouty arthropathy, OA, lumbar laminectomy 2011, Rt TKA   PAIN:  No pain upon arrival.    PRECAUTIONS: None   WEIGHT BEARING RESTRICTIONS No   FALLS:  Has patient fallen in last 6 months? Maybe 1 fall in last 6 months.  Several falls within last year.    LIVING ENVIRONMENT: Lives with:lives alone Lives in: House/apartment Stairs: 3 steps to enter deck, rail on Lt going up.  No stairs in house.  Has following equipment at home: FWW, cane   OCCUPATION: retired    PLOF: Independent  , cooking/cleaning, shopping/grocery store   [place]  stronger, walking independently.     OBJECTIVE:    PATIENT SURVEYS:  01/25/2022 No foto - incorrect set up    COGNITION: 01/25/2022 Overall cognitive status: Within functional limits for tasks assessed     SENSATION: 01/25/2022 not tested today   POSTURE:  01/25/2022 rounded shoulders, forward head, increased thoracic kyphosis, and flexed trunk    PALPATION: 01/25/2022 no specific tenderness noted in screening for balance.       MMT:            01/25/2022:  Seated resisted static testing hip abduction Rt: strong/painless. Lt moderate strength, painless (held MMT due to difficulty in bed mobility positioning MMT Right 01/25/2022 Left 01/25/2022 Left 02/22/2022 Left 03/11/2022 Left 03/16/22 Left 04/01/2022  Shoulder flexion          Shoulder extension          Shoulder abduction          Shoulder adduction          Shoulder extension          Shoulder internal rotation          Shoulder external rotation                                Hip flexion 5/5 4/5 4/5 4/5 4/5 4+/5  Hip extension          Hip abduction          Knee flexion 5/5 5/5      Knee extension  5/5 5/5    4+/5  Ankle DF 5/5 4/5  4/5 4+/5    (Blank rows = not tested)   CERVICAL SPECIAL TESTS:  01/25/2022 none performed   FUNCTIONAL TESTS:  03/22/22: TUG 44 seconds C FWW (done at very end of session)   03/22/2022  Standardized Balance Assessment  Standardized Balance Assessment Berg Balance Test  Berg Balance Test  Sit to Stand 3  Standing Unsupported 3  Sitting with Back Unsupported but Feet Supported on Floor or Stool 4  Stand to Sit 3  Transfers 3  Standing Unsupported with Eyes Closed 3  Standing Unsupported with Feet Together 3  From Standing, Reach Forward with Outstretched Arm 3  From Standing Position, Pick up Object from Floor 3  From Standing Position, Turn to Look Behind Over each Shoulder 2  Turn 360 Degrees 1  Standing Unsupported, Alternately Place Feet on Step/Stool 0  Standing  Unsupported, One Foot in Front 1  Standing on One Leg 0  Total Score 32   03/08/2022:   TUG:  41.76 seconds c FWW  01/25/2022        TUG:   71 seconds c FWW     [facility] PT Assessment - 01/25/22 0001                Standardized Balance Assessment    Standardized Balance Assessment Berg Balance Test          Berg Balance Test    Sit to Stand Able to stand using hands after several tries     Standing Unsupported Able to stand 2 minutes with supervision     Sitting with Back Unsupported but Feet Supported on Floor or Stool Able to sit safely and securely 2 minutes     Stand to Sit Uses backs of legs against chair to control descent     Transfers Able to transfer safely, definite need of hands     Standing Unsupported with Eyes Closed Able to stand 3 seconds     Standing Unsupported with Feet Together Needs help to attain position but able to stand for 30 seconds with feet together     From Standing, Reach Forward with Outstretched Arm Can reach forward >5 cm safely (2")     From Standing Position, Pick up Object from Floor Unable to pick up and needs supervision     From Standing Position, Turn to Look Behind Over each Shoulder Needs assist to keep from losing balance and falling     Turn 360 Degrees Needs assistance while turning     Standing Unsupported, Alternately Place Feet on Step/Stool Needs assistance to keep from falling or unable to try     Standing Unsupported, One Foot in Front Needs help to step but can hold 15 seconds     Standing on One Leg Unable to try or needs assist to prevent fall     Total Score 21                GAIT ASSESSMENT: 03/18/2022:  FWW into clinic c mod independent, decreased gait speed.  SPC use in Rt UE c CGA 33 ft today c cues for sequencing.    TODAY'S TREATMENT:  04/01/2022: Therex: Nustep Lvl 6 3 mins, Lvl 5 7 mins Seated SLR x 10 bilateral slow focus Standing PF x 12 bilateral assist on bar with hands Standing DF x 12 bilateral assist on bar  with hands   Neuro Re-ed Feet together stance on foam eyes open 1 min x 2 c SBA Feet together eyes closed attempted on foam and on floor 10 sec each c min A required to prevent loss of balance Modified tandem stance 45 seconds x 2 bilateral c SBA to occasional min A  TherActivity Leg press Double leg 50 lbs x 12 (cues to avoid hyperextension Lt knee), single leg Rt x15 37 lbs, single leg Lt x15 25 lbs   03/29/2022: Therex: Nustep Lvl 6 4 mins, Lvl 5 6  mins Lateral stepping c SPC in Rt UE, occasional Lt hand on bar, CGA 6 ft Lt and Rt x 3 each way Standing PF x 10 bilateral assist on bar with hands Standing DF x 10 bilateral assist on bar with hands Standing marching toe tap on 6 inch step X 10 bilat with Rt hand on bar   TherActivity Repetitive fwd stepping practice c cane in Rt UE c verbal cues (leading with Lt leg) x 10 SPC  use ambulation 30 ft X 2 c SBA c verbal cues for sequencing (long rest break in between sets) Leg press Double leg 43 lbs x 12 (cues to avoid hyperextension Lt knee), single leg Rt 10 37 lbs, single leg Lt 2 x 10 25 lbs    03/25/2022: Therex: Nustep Lvl 6 3.5 mins, Lvl 5 6.5 mins Lateral stepping c SPC in Rt UE, occasional Lt hand on bar, CGA 6 ft Lt and Rt x 3 each way Standing PF x 10 bilateral assist on bar with hands Standing DF x 10 bilateral assist on bar with hands Standing marching toe tap on platform of treadmill x 5 bilateral CGA /min A with Rt hand on bar   TherActivity Repetitive fwd stepping practice c cane in Rt UE c verbal cues (leading with Lt leg) x 10 SPC use ambulation 30 ft c SBA c verbal cues for sequencing Leg press Double leg 43 lbs x 10 (cues to avoid hyperextension Lt knee), single leg Rt 2 x 10 37 lbs, single leg Lt 2 x 10 25 lbs   - PATIENT EDUCATION:  03/08/2022 Education details: HEP progression Person educated: Patient Education method: Consulting civil engineer, Media planner, Verbal cues, and Handouts Education comprehension:  verbalized understanding, returned demonstration, and verbal cues required     HOME EXERCISE PROGRAM: Access Code: [email or web address] URL: [email or web address] Date: 03/08/2022 Prepared by: Scot Jun  Exercises - Seated March  - 1-2 x daily - 7 x weekly - 1-2 sets - 10 reps - Sit to Stand  - 2-3 x daily - 7 x weekly - 1 sets - 5-10 reps - Seated Long Arc Quad  - 2-3 x daily - 7 x weekly - 1-2 sets - 10 reps - 2 hold - Standing Scapular Retraction  - 5 x daily - 7 x weekly - 1 sets - 5 reps - 5 second hold - Heel Toe Raises with Counter Support  - 1-2 x daily - 7 x weekly - 1-2 sets - 10 reps - Standing Tandem Balance with Counter Support  - 1-2 x daily - 7 x weekly - 1 sets - 10 reps   ASSESSMENT:   CLINICAL IMPRESSION: Continued quad deficits and general LE strength deficits impair functional movement and control in WB (ambulation, transfers).  Continued efforts on improved strength and balance control warranted at this time c skilled PT services.      OBJECTIVE IMPAIRMENTS Abnormal gait, decreased activity tolerance, decreased balance, decreased endurance, decreased mobility, difficulty walking, decreased strength, impaired perceived functional ability, impaired flexibility, improper body mechanics, postural dysfunction, and pain.    ACTIVITY LIMITATIONS carrying, lifting, bending, standing, squatting, stairs, transfers, bed mobility, reach over head, and locomotion level   PARTICIPATION LIMITATIONS: meal prep, cleaning, interpersonal relationship, driving, shopping, and community activity   PERSONAL FACTORS   History of 10/19/21 C3-4 C4-5 C5-6 ACDF with soft collar PMH bracial neuritis, abnormal gait, Degeneration of lumbar, HTN, gouty arthropathy, OA, lumbar laminectomy 2011, Rt TKA  are also affecting patient's functional outcome.    REHAB POTENTIAL: Good   CLINICAL DECISION MAKING: Stable/uncomplicated   EVALUATION COMPLEXITY: Low     GOALS: Goals reviewed with  patient? Yes   Short term PT Goals (target date for Short term goals are 3 weeks 02/15/2022) Patient will demonstrate independent use of home exercise program to maintain progress from in clinic treatments. Goal status: MET   Long term PT goals (target dates for all long term goals are 10 weeks  04/05/2022 )   1.  Patient will demonstrate/report pain at worst less than or equal to 2/10 to facilitate minimal limitation in daily activity secondary to pain symptoms. Goal status: on going assessed 03/25/2022   2. Patient will demonstrate independent use of home exercise program to facilitate ability to maintain/progress functional gains from skilled physical therapy services. Goal status: on going assessed 03/25/2022   3. Patient will demonstrate bilateral hip MMT 5/5 throughout to facilitate improved transfers, ambulation towards independence.  Goal status:on going assessed 03/25/2022   4.  Patient will demonstrate BERG testing > or = 45 to indicate reduced fall risk.  Goal status: on going assessed 03/25/2022   5.  Patient will demonstrate TUG c LRAD < or = 20 seconds to indicate reduced fall risk/ improved community ambulation.    Goal status: on going assessed 03/25/2022   6.  Patient will demonstrate ability to ambulation c LRAD (SPC, independent) community distances > 300 ft.   Goal status: on going assessed 03/25/2022       PLAN: PT FREQUENCY: 1-2x/week   PT DURATION: 10 weeks   PLANNED INTERVENTIONS: Therapeutic exercises, Therapeutic activity, Neuro Muscular re-education, Balance training, Gait training, Patient/Family education, Joint mobilization, Stair training, DME instructions, Dry Needling, Electrical stimulation, Cryotherapy, Moist heat, Taping, Ultrasound, Ionotophoresis 4mg /ml Dexamethasone, and Manual therapy.  All included unless contraindicated   PLAN FOR NEXT SESSION: Recert for extension of POC required (check date in comparison to next visit date).   Scot Jun,  PT, DPT, OCS, ATC 04/01/22  4:34 PM

## 2022-04-05 ENCOUNTER — Encounter: Payer: Self-pay | Admitting: Physical Therapy

## 2022-04-05 ENCOUNTER — Ambulatory Visit (INDEPENDENT_AMBULATORY_CARE_PROVIDER_SITE_OTHER): Payer: PPO | Admitting: Physical Therapy

## 2022-04-05 DIAGNOSIS — R293 Abnormal posture: Secondary | ICD-10-CM

## 2022-04-05 DIAGNOSIS — M542 Cervicalgia: Secondary | ICD-10-CM

## 2022-04-05 DIAGNOSIS — R2689 Other abnormalities of gait and mobility: Secondary | ICD-10-CM

## 2022-04-05 DIAGNOSIS — M6281 Muscle weakness (generalized): Secondary | ICD-10-CM

## 2022-04-05 NOTE — Therapy (Signed)
OUTPATIENT PHYSICAL THERAPY TREATMENT NOTE/Progress note/Recert Progress Note reporting period 03/22/22 to 04/05/22  See below for objective and subjective measurements relating to patients progress with PT.   Patient Name: Claudia [name] MRN: [id number] DOB:[birth year], 88 y.o., female Today's Date: 04/05/2022   END OF SESSION:   PT End of Session - 04/05/22 1305     Visit Number 14    Number of Visits 25    Date for PT Re-Evaluation 05/17/22    Authorization Type [employer] $15 copay    Progress Note Due on Visit 20    PT Start Time 1300    PT Stop Time 1340    PT Time Calculation (min) 40 min    Activity Tolerance Patient tolerated treatment well;Patient limited by fatigue    Behavior During Therapy WFL for tasks assessed/performed                     Past Medical History:  Diagnosis Date   Abnormality of gait    Brachial neuritis or radiculitis NOS    Degeneration of lumbar or lumbosacral intervertebral disc    Dysrhythmia    Essential hypertension, benign    Gouty arthropathy    Lumbago    Obesity    Osteoarthritis    Pure hypercholesterolemia    Unspecified hypothyroidism    Past Surgical History:  Procedure Laterality Date   ABDOMINAL HYSTERECTOMY  03/13/2010   ANTERIOR CERVICAL DECOMP/DISCECTOMY FUSION N/A 10/19/2021   Procedure: C3-4, C4-5, C5-6 ANTERIOR CERVICAL DISCECTOMY FUSION, ALLOGRAFT, PLATE;  Surgeon: [name], MD;  Location: [facility];  Service: Orthopedics;  Laterality: N/A;   BACK SURGERY     LUMBAR LAMINECTOMY  03/13/2010   right knee replacement  03/13/2010   Patient Active Problem List   Diagnosis Date Noted   Deficiency anemia 02/15/2022   Vitamin B12 deficiency neuropathy ([place]) 02/15/2022   Hypercalcemia 02/15/2022   S/P cervical spinal fusion 11/10/2021   Cervical stricture or stenosis 10/20/2021   Cervical spinal stenosis 10/19/2021   Meningioma ([place]) 08/24/2021   PAF (paroxysmal atrial fibrillation) ([place]) 11/03/2020    Stage 3b chronic kidney disease ([place]) 05/06/2020   Chronic left-sided low back pain without sciatica 12/25/2018   Spinal stenosis in cervical region 05/12/2016   Therapeutic opioid-induced constipation ([facility]) 01/01/2016   Chronic idiopathic constipation 09/11/2013   Lumbosacral spondylosis without myelopathy 04/09/2013   Postlaminectomy syndrome, lumbar region 04/09/2013   Anticoagulation management encounter 02/01/2012   Neuropathy, peripheral 08/04/2011   Pure hypercholesterolemia 08/04/2011   Essential hypertension, benign 08/04/2011   Hypothyroidism 08/04/2011   DJD (degenerative joint disease) of knee 08/04/2011   Gout 08/04/2011      THERAPY DIAG:  Cervicalgia  Other abnormalities of gait and mobility  Muscle weakness (generalized)  Abnormal posture  PCP: Janith Lima. MD   REFERRING PROVIDER: [name], MD   REFERRING DIAG: Z98.1 (ICD-10-CM) - S/P cervical spinal fusion    Rationale for Evaluation and Treatment Rehabilitation   ONSET DATE: Dec 2022   SUBJECTIVE:  SUBJECTIVE STATEMENT: No pain to report upon arrival, able to transfer easier due to now being able to lift her leg. She will see MD tommorow    PERTINENT HISTORY:  History of 10/19/21 C3-4 C4-5 C5-6 ACDF with soft collar PMH bracial neuritis, abnormal gait, Degeneration of lumbar, HTN, gouty arthropathy, OA, lumbar laminectomy 2011, Rt TKA   PAIN:  No pain upon arrival.    PRECAUTIONS: None   WEIGHT BEARING RESTRICTIONS No   FALLS:  Has patient fallen in last 6 months? Maybe 1 fall in last 6 months.  Several falls within last year.    LIVING ENVIRONMENT: Lives with:lives alone Lives in: House/apartment Stairs: 3 steps to enter deck, rail on Lt going up.  No stairs in house.  Has following  equipment at home: FWW, cane   OCCUPATION: retired    PLOF: Independent  , cooking/cleaning, shopping/grocery store   [place] stronger, walking independently.     OBJECTIVE:    PATIENT SURVEYS:  01/25/2022 No foto - incorrect set up    COGNITION: 01/25/2022 Overall cognitive status: Within functional limits for tasks assessed     SENSATION: 01/25/2022 not tested today   POSTURE:  01/25/2022 rounded shoulders, forward head, increased thoracic kyphosis, and flexed trunk    PALPATION: 01/25/2022 no specific tenderness noted in screening for balance.       MMT:            01/25/2022:  Seated resisted static testing hip abduction Rt: strong/painless. Lt moderate strength, painless (held MMT due to difficulty in bed mobility positioning MMT Right 01/25/2022 Left 01/25/2022 Left 02/22/2022 Left 03/11/2022 Left 03/16/22 Left 04/01/2022 Left 04/05/22  Shoulder flexion           Shoulder extension           Shoulder abduction           Shoulder adduction           Shoulder extension           Shoulder internal rotation           Shoulder external rotation                                   Hip flexion 5/5 4/5 4/5 4/5 4/5 4+/5 4+  Hip extension           Hip abduction           Knee flexion 5/5 5/5       Knee extension  5/5 5/5    4+/5 4+  Ankle DF 5/5 4/5  4/5 4+/5     (Blank rows = not tested)   CERVICAL SPECIAL TESTS:  01/25/2022 none performed   FUNCTIONAL TESTS:  04/05/22 TUG 38.5s seconds with FWW 03/22/22: TUG 44 seconds C FWW (done at very end of session) 03/08/2022:   TUG:  41.76 seconds c FWW 01/25/2022        TUG:   71 seconds c FWW   03/22/2022  Standardized Balance Assessment  Standardized Balance Assessment Berg Balance Test  Berg Balance Test  Sit to Stand 3  Standing Unsupported 3  Sitting with Back Unsupported but Feet Supported on Floor or Stool 4  Stand to Sit 3  Transfers 3  Standing Unsupported with Eyes Closed 3  Standing Unsupported with  Feet Together 3  From Standing, Reach Forward with Outstretched Arm 3  From Standing  Position, Pick up Object from Floor 3  From Standing Position, Turn to Look Behind Over each Shoulder 2  Turn 360 Degrees 1  Standing Unsupported, Alternately Place Feet on Step/Stool 0  Standing Unsupported, One Foot in Front 1  Standing on One Leg 0  Total Score 32        [facility] PT Assessment - 01/25/22 0001                Standardized Balance Assessment    Standardized Balance Assessment Berg Balance Test          Berg Balance Test    Sit to Stand Able to stand using hands after several tries     Standing Unsupported Able to stand 2 minutes with supervision     Sitting with Back Unsupported but Feet Supported on Floor or Stool Able to sit safely and securely 2 minutes     Stand to Sit Uses backs of legs against chair to control descent     Transfers Able to transfer safely, definite need of hands     Standing Unsupported with Eyes Closed Able to stand 3 seconds     Standing Unsupported with Feet Together Needs help to attain position but able to stand for 30 seconds with feet together     From Standing, Reach Forward with Outstretched Arm Can reach forward >5 cm safely (2")     From Standing Position, Pick up Object from Floor Unable to pick up and needs supervision     From Standing Position, Turn to Look Behind Over each Shoulder Needs assist to keep from losing balance and falling     Turn 360 Degrees Needs assistance while turning     Standing Unsupported, Alternately Place Feet on Step/Stool Needs assistance to keep from falling or unable to try     Standing Unsupported, One Foot in Front Needs help to step but can hold 15 seconds     Standing on One Leg Unable to try or needs assist to prevent fall     Total Score 21                GAIT ASSESSMENT: 03/18/2022:  FWW into clinic c mod independent, decreased gait speed.  SPC use in Rt UE c CGA 33 ft today c cues for sequencing.     TODAY'S TREATMENT:  04/05/2022: Therex: Nustep Lvl 6 3 mins, Lvl 5 8 mins Seated SLR 2 x 10 bilateral slow focus   Neuro Re-ed Feet together stance  eyes open with head turns X 10 each sie Feet together eyes closed 15 sec each X 5 c min A required to prevent loss of balance Modified tandem stance 45 seconds x 2 bilateral c SBA to occasional min A  TherActivity Leg press Double leg 50 lbs x 15 (cues to avoid hyperextension Lt knee), single leg Rt x15 37 lbs, single leg Lt x15 25 lbs  TUG test, see above for details  04/01/2022: Therex: Nustep Lvl 6 3 mins, Lvl 5 7 mins Seated SLR x 10 bilateral slow focus Standing PF x 12 bilateral assist on bar with hands Standing DF x 12 bilateral assist on bar with hands   Neuro Re-ed Feet together stance on foam eyes open 1 min x 2 c SBA Feet together eyes closed attempted on foam and on floor 10 sec each c min A required to prevent loss of balance Modified tandem stance 45 seconds x 2 bilateral c SBA to occasional min A  TherActivity Leg press Double leg 50 lbs x 12 (cues to avoid hyperextension Lt knee), single leg Rt x15 37 lbs, single leg Lt x15 25 lbs    - PATIENT EDUCATION:  03/08/2022 Education details: HEP progression Person educated: Patient Education method: Consulting civil engineer, Media planner, Verbal cues, and Handouts Education comprehension: verbalized understanding, returned demonstration, and verbal cues required     HOME EXERCISE PROGRAM: Access Code: [email or web address] URL: [email or web address] Date: 03/08/2022 Prepared by: Scot Jun  Exercises - Seated March  - 1-2 x daily - 7 x weekly - 1-2 sets - 10 reps - Sit to Stand  - 2-3 x daily - 7 x weekly - 1 sets - 5-10 reps - Seated Long Arc Quad  - 2-3 x daily - 7 x weekly - 1-2 sets - 10 reps - 2 hold - Standing Scapular Retraction  - 5 x daily - 7 x weekly - 1 sets - 5 reps - 5 second hold - Heel Toe Raises with Counter Support  - 1-2 x daily - 7 x weekly - 1-2  sets - 10 reps - Standing Tandem Balance with Counter Support  - 1-2 x daily - 7 x weekly - 1 sets - 10 reps   ASSESSMENT:   CLINICAL IMPRESSION: Recert today as PT plan of care date had expired. Progress note as she will follow up with MD this week. She is making slow progress but this is expected based on her limitations and conditions. PT recommending up to another 6 weeks to improve strength and balance control with skilled PT services to improve functional standing activity, ambulation, and reduce risk of falling. She has made progress toward her long term PT goals but has not yet met them.      OBJECTIVE IMPAIRMENTS Abnormal gait, decreased activity tolerance, decreased balance, decreased endurance, decreased mobility, difficulty walking, decreased strength, impaired perceived functional ability, impaired flexibility, improper body mechanics, postural dysfunction, and pain.    ACTIVITY LIMITATIONS carrying, lifting, bending, standing, squatting, stairs, transfers, bed mobility, reach over head, and locomotion level   PARTICIPATION LIMITATIONS: meal prep, cleaning, interpersonal relationship, driving, shopping, and community activity   PERSONAL FACTORS   History of 10/19/21 C3-4 C4-5 C5-6 ACDF with soft collar PMH bracial neuritis, abnormal gait, Degeneration of lumbar, HTN, gouty arthropathy, OA, lumbar laminectomy 2011, Rt TKA  are also affecting patient's functional outcome.    REHAB POTENTIAL: Good   CLINICAL DECISION MAKING: Stable/uncomplicated   EVALUATION COMPLEXITY: Low     GOALS: Goals reviewed with patient? Yes   Short term PT Goals (target date for Short term goals are 3 weeks 02/15/2022) Patient will demonstrate independent use of home exercise program to maintain progress from in clinic treatments. Goal status: MET   Long term PT goals (target dates for all long term goals are 10 weeks  05/17/2022 )   1. Patient will demonstrate/report pain at worst less than or  equal to 2/10 to facilitate minimal limitation in daily activity secondary to pain symptoms. Goal status: MET 04/05/22   2. Patient will demonstrate independent use of home exercise program to facilitate ability to maintain/progress functional gains from skilled physical therapy services. Goal status: on going assessed 04/05/2022   3. Patient will demonstrate bilateral hip MMT 5/5 throughout to facilitate improved transfers, ambulation towards independence.  Goal status:on going assessed 04/05/22   4.  Patient will demonstrate BERG testing > or = 45 to indicate reduced fall risk.  Goal status: on going assessed 04/05/2022   5.  Patient  will demonstrate TUG c LRAD < or = 20 seconds to indicate reduced fall risk/ improved community ambulation.    Goal status: on going assessed 10/92023   6.  Patient will demonstrate ability to ambulation c LRAD (SPC, independent) community distances > 300 ft.   Goal status: on going assessed 04/05/22       PLAN: PT FREQUENCY: 1-2x/week   PT DURATION: 6 more  weeks from recert   PLANNED INTERVENTIONS: Therapeutic exercises, Therapeutic activity, Neuro Muscular re-education, Balance training, Gait training, Patient/Family education, Joint mobilization, Stair training, DME instructions, Dry Needling, Electrical stimulation, Cryotherapy, Moist heat, Taping, Ultrasound, Ionotophoresis 100m/ml Dexamethasone, and Manual therapy.  All included unless contraindicated   PLAN FOR NEXT SESSION: work toward long term goals, leg strength, balance, gait  BElsie Ra PT, DPT 04/05/22 1:48 PM

## 2022-04-08 ENCOUNTER — Ambulatory Visit: Payer: PPO | Admitting: Rehabilitative and Restorative Service Providers"

## 2022-04-08 ENCOUNTER — Encounter: Payer: Self-pay | Admitting: Rehabilitative and Restorative Service Providers"

## 2022-04-08 DIAGNOSIS — M6281 Muscle weakness (generalized): Secondary | ICD-10-CM

## 2022-04-08 DIAGNOSIS — R293 Abnormal posture: Secondary | ICD-10-CM

## 2022-04-08 DIAGNOSIS — M542 Cervicalgia: Secondary | ICD-10-CM

## 2022-04-08 DIAGNOSIS — R2689 Other abnormalities of gait and mobility: Secondary | ICD-10-CM | POA: Diagnosis not present

## 2022-04-08 NOTE — Therapy (Signed)
OUTPATIENT PHYSICAL THERAPY TREATMENT NOTE Progress Note reporting period 03/22/22 to 04/05/22  See below for objective and subjective measurements relating to patients progress with PT.   Patient Name: Claudia [name] MRN: [id number] DOB:June 27, 1934, 88 y.o., female Today's Date: 04/08/2022   END OF SESSION:   PT End of Session - 04/08/22 1609     Visit Number 15    Number of Visits 25    Date for PT Re-Evaluation 05/17/22    Authorization Type [employer] $15 copay    Progress Note Due on Visit 24   did last one at #14   PT Start Time 1554    PT Stop Time 1634    PT Time Calculation (min) 40 min    Activity Tolerance Patient tolerated treatment well    Behavior During Therapy WFL for tasks assessed/performed                      Past Medical History:  Diagnosis Date   Abnormality of gait    Brachial neuritis or radiculitis NOS    Degeneration of lumbar or lumbosacral intervertebral disc    Dysrhythmia    Essential hypertension, benign    Gouty arthropathy    Lumbago    Obesity    Osteoarthritis    Pure hypercholesterolemia    Unspecified hypothyroidism    Past Surgical History:  Procedure Laterality Date   ABDOMINAL HYSTERECTOMY  03/13/2010   ANTERIOR CERVICAL DECOMP/DISCECTOMY FUSION N/A 10/19/2021   Procedure: C3-4, C4-5, C5-6 ANTERIOR CERVICAL DISCECTOMY FUSION, ALLOGRAFT, PLATE;  Surgeon: [name], MD;  Location: [place];  Service: Orthopedics;  Laterality: N/A;   BACK SURGERY     LUMBAR LAMINECTOMY  03/13/2010   right knee replacement  03/13/2010   Patient Active Problem List   Diagnosis Date Noted   Deficiency anemia 02/15/2022   Vitamin B12 deficiency neuropathy ([place]) 02/15/2022   Hypercalcemia 02/15/2022   S/P cervical spinal fusion 11/10/2021   Cervical stricture or stenosis 10/20/2021   Cervical spinal stenosis 10/19/2021   Meningioma ([place]) 08/24/2021   PAF (paroxysmal atrial fibrillation) ([place]) 11/03/2020   Stage 3b chronic kidney  disease ([place]) 05/06/2020   Chronic left-sided low back pain without sciatica 12/25/2018   Spinal stenosis in cervical region 05/12/2016   Therapeutic opioid-induced constipation ([facility]) 01/01/2016   Chronic idiopathic constipation 09/11/2013   Lumbosacral spondylosis without myelopathy 04/09/2013   Postlaminectomy syndrome, lumbar region 04/09/2013   Anticoagulation management encounter 02/01/2012   Neuropathy, peripheral 08/04/2011   Pure hypercholesterolemia 08/04/2011   Essential hypertension, benign 08/04/2011   Hypothyroidism 08/04/2011   DJD (degenerative joint disease) of knee 08/04/2011   Gout 08/04/2011      THERAPY DIAG:  Cervicalgia  Other abnormalities of gait and mobility  Muscle weakness (generalized)  Abnormal posture  PCP: Janith Lima. MD   REFERRING PROVIDER: [name], MD   REFERRING DIAG: Z98.1 (ICD-10-CM) - S/P cervical spinal fusion    Rationale for Evaluation and Treatment Rehabilitation   ONSET DATE: Dec 2022   SUBJECTIVE:  SUBJECTIVE STATEMENT: Pt. Indicated feeling no complaints of pain still.  Felt like she is getting around a bit better overall.     PERTINENT HISTORY:  History of 10/19/21 C3-4 C4-5 C5-6 ACDF with soft collar PMH bracial neuritis, abnormal gait, Degeneration of lumbar, HTN, gouty arthropathy, OA, lumbar laminectomy 2011, Rt TKA   PAIN:  No pain upon arrival.    PRECAUTIONS: None   WEIGHT BEARING RESTRICTIONS No   FALLS:  Has patient fallen in last 6 months? Maybe 1 fall in last 6 months.  Several falls within last year.    LIVING ENVIRONMENT: Lives with:lives alone Lives in: House/apartment Stairs: 3 steps to enter deck, rail on Lt going up.  No stairs in house.  Has following equipment at home: FWW, cane    OCCUPATION: retired    PLOF: Independent  , cooking/cleaning, shopping/grocery store   [place] stronger, walking independently.     OBJECTIVE:    PATIENT SURVEYS:  01/25/2022 No foto - incorrect set up    COGNITION: 01/25/2022 Overall cognitive status: Within functional limits for tasks assessed     SENSATION: 01/25/2022 not tested today   POSTURE:  01/25/2022 rounded shoulders, forward head, increased thoracic kyphosis, and flexed trunk    PALPATION: 01/25/2022 no specific tenderness noted in screening for balance.       MMT:            01/25/2022:  Seated resisted static testing hip abduction Rt: strong/painless. Lt moderate strength, painless (held MMT due to difficulty in bed mobility positioning MMT Right 01/25/2022 Left 01/25/2022 Left 02/22/2022 Left 03/11/2022 Left 03/16/22 Left 04/01/2022 Left 04/05/22  Shoulder flexion           Shoulder extension           Shoulder abduction           Shoulder adduction           Shoulder extension           Shoulder internal rotation           Shoulder external rotation                                   Hip flexion 5/5 4/5 4/5 4/5 4/5 4+/5 4+  Hip extension           Hip abduction           Knee flexion 5/5 5/5       Knee extension  5/5 5/5    4+/5 4+  Ankle DF 5/5 4/5  4/5 4+/5     (Blank rows = not tested)   CERVICAL SPECIAL TESTS:  01/25/2022 none performed   FUNCTIONAL TESTS:  04/05/22 TUG 38.5s seconds with FWW 03/22/22: TUG 44 seconds C FWW (done at very end of session) 03/08/2022:   TUG:  41.76 seconds c FWW 01/25/2022        TUG:   71 seconds c FWW   03/22/2022  Standardized Balance Assessment  Standardized Balance Assessment Berg Balance Test  Berg Balance Test  Sit to Stand 3  Standing Unsupported 3  Sitting with Back Unsupported but Feet Supported on Floor or Stool 4  Stand to Sit 3  Transfers 3  Standing Unsupported with Eyes Closed 3  Standing Unsupported with Feet Together 3  From Standing,  Reach Forward with Outstretched Arm 3  From Standing Position, Pick up Object  from Floor 3  From Standing Position, Turn to Look Behind Over each Shoulder 2  Turn 360 Degrees 1  Standing Unsupported, Alternately Place Feet on Step/Stool 0  Standing Unsupported, One Foot in Front 1  Standing on One Leg 0  Total Score 32        [facility] PT Assessment - 01/25/22 0001                Standardized Balance Assessment    Standardized Balance Assessment Berg Balance Test          Berg Balance Test    Sit to Stand Able to stand using hands after several tries     Standing Unsupported Able to stand 2 minutes with supervision     Sitting with Back Unsupported but Feet Supported on Floor or Stool Able to sit safely and securely 2 minutes     Stand to Sit Uses backs of legs against chair to control descent     Transfers Able to transfer safely, definite need of hands     Standing Unsupported with Eyes Closed Able to stand 3 seconds     Standing Unsupported with Feet Together Needs help to attain position but able to stand for 30 seconds with feet together     From Standing, Reach Forward with Outstretched Arm Can reach forward >5 cm safely (2")     From Standing Position, Pick up Object from Floor Unable to pick up and needs supervision     From Standing Position, Turn to Look Behind Over each Shoulder Needs assist to keep from losing balance and falling     Turn 360 Degrees Needs assistance while turning     Standing Unsupported, Alternately Place Feet on Step/Stool Needs assistance to keep from falling or unable to try     Standing Unsupported, One Foot in Front Needs help to step but can hold 15 seconds     Standing on One Leg Unable to try or needs assist to prevent fall     Total Score 21                GAIT ASSESSMENT: 03/18/2022:  FWW into clinic c mod independent, decreased gait speed.  SPC use in Rt UE c CGA 33 ft today c cues for sequencing.    TODAY'S TREATMENT:   04/08/2022 Therex: Nustep lvl 6 4 mins, Lvl 5 6 mins (10 mins total) Leg press Double leg 50 lbs x 15, single leg Lt 31 lbs x 15, single leg Rt 37 lbs x 15  Sit to stand to sit 20 inch chair s UE assist x 10  Neuro Re-ed Standing feet together eyes open 2 mins c SBA, eyes closed 20 seconds min A required to prevent loss of balance Fwd BIG inspired stepping c target on ground for distance x 15 bilateral c SBA in min A and single hand on walker (rt hand)   04/05/2022: Therex: Nustep Lvl 6 3 mins, Lvl 5 8 mins Seated SLR 2 x 10 bilateral slow focus   Neuro Re-ed Feet together stance  eyes open with head turns X 10 each sie Feet together eyes closed 15 sec each X 5 c min A required to prevent loss of balance Modified tandem stance 45 seconds x 2 bilateral c SBA to occasional min A  TherActivity Leg press Double leg 50 lbs x 15 (cues to avoid hyperextension Lt knee), single leg Rt x15 37 lbs, single leg Lt x15 25 lbs  TUG  test, see above for details  04/01/2022: Therex: Nustep Lvl 6 3 mins, Lvl 5 7 mins Seated SLR x 10 bilateral slow focus Standing PF x 12 bilateral assist on bar with hands Standing DF x 12 bilateral assist on bar with hands   Neuro Re-ed Feet together stance on foam eyes open 1 min x 2 c SBA Feet together eyes closed attempted on foam and on floor 10 sec each c min A required to prevent loss of balance Modified tandem stance 45 seconds x 2 bilateral c SBA to occasional min A  TherActivity Leg press Double leg 50 lbs x 12 (cues to avoid hyperextension Lt knee), single leg Rt x15 37 lbs, single leg Lt x15 25 lbs    PATIENT EDUCATION:  03/08/2022 Education details: HEP progression Person educated: Patient Education method: Programmer, multimedia, Facilities manager, Verbal cues, and Handouts Education comprehension: verbalized understanding, returned demonstration, and verbal cues required     HOME EXERCISE PROGRAM: Access Code: [email or web address] URL:  [email or web address] Date: 03/08/2022 Prepared by: Chyrel Masson  Exercises - Seated March  - 1-2 x daily - 7 x weekly - 1-2 sets - 10 reps - Sit to Stand  - 2-3 x daily - 7 x weekly - 1 sets - 5-10 reps - Seated Long Arc Quad  - 2-3 x daily - 7 x weekly - 1-2 sets - 10 reps - 2 hold - Standing Scapular Retraction  - 5 x daily - 7 x weekly - 1 sets - 5 reps - 5 second hold - Heel Toe Raises with Counter Support  - 1-2 x daily - 7 x weekly - 1-2 sets - 10 reps - Standing Tandem Balance with Counter Support  - 1-2 x daily - 7 x weekly - 1 sets - 10 reps   ASSESSMENT:   CLINICAL IMPRESSION: Pt to continue on focus on improved strengthening for bilateral leg (Lt specifically) as well as interventions to encourage improved gait speed/step length for improved ambulation quality.  Continued skilled PT services medically necessary at this time.    OBJECTIVE IMPAIRMENTS Abnormal gait, decreased activity tolerance, decreased balance, decreased endurance, decreased mobility, difficulty walking, decreased strength, impaired perceived functional ability, impaired flexibility, improper body mechanics, postural dysfunction, and pain.    ACTIVITY LIMITATIONS carrying, lifting, bending, standing, squatting, stairs, transfers, bed mobility, reach over head, and locomotion level   PARTICIPATION LIMITATIONS: meal prep, cleaning, interpersonal relationship, driving, shopping, and community activity   PERSONAL FACTORS   History of 10/19/21 C3-4 C4-5 C5-6 ACDF with soft collar PMH bracial neuritis, abnormal gait, Degeneration of lumbar, HTN, gouty arthropathy, OA, lumbar laminectomy 2011, Rt TKA  are also affecting patient's functional outcome.    REHAB POTENTIAL: Good   CLINICAL DECISION MAKING: Stable/uncomplicated   EVALUATION COMPLEXITY: Low     GOALS: Goals reviewed with patient? Yes   Short term PT Goals (target date for Short term goals are 3 weeks 02/15/2022) Patient will  demonstrate independent use of home exercise program to maintain progress from in clinic treatments. Goal status: MET   Long term PT goals (target dates for all long term goals are 10 weeks  05/17/2022 )   1. Patient will demonstrate/report pain at worst less than or equal to 2/10 to facilitate minimal limitation in daily activity secondary to pain symptoms. Goal status: MET 04/05/22   2. Patient will demonstrate independent use of home exercise program to facilitate ability to maintain/progress functional gains from skilled physical therapy services. Goal status: on going assessed 04/05/2022  3. Patient will demonstrate bilateral hip MMT 5/5 throughout to facilitate improved transfers, ambulation towards independence.  Goal status:on going assessed 04/05/22   4.  Patient will demonstrate BERG testing > or = 45 to indicate reduced fall risk.  Goal status: on going assessed 04/05/2022   5.  Patient will demonstrate TUG c LRAD < or = 20 seconds to indicate reduced fall risk/ improved community ambulation.    Goal status: on going assessed 10/92023   6.  Patient will demonstrate ability to ambulation c LRAD (SPC, independent) community distances > 300 ft.   Goal status: on going assessed 04/05/2022       PLAN: PT FREQUENCY: 1-2x/week   PT DURATION: 6 more  weeks from recert   PLANNED INTERVENTIONS: Therapeutic exercises, Therapeutic activity, Neuro Muscular re-education, Balance training, Gait training, Patient/Family education, Joint mobilization, Stair training, DME instructions, Dry Needling, Electrical stimulation, Cryotherapy, Moist heat, Taping, Ultrasound, Ionotophoresis 4mg /ml Dexamethasone, and Manual therapy.  All included unless contraindicated   PLAN FOR NEXT SESSION: Continue Lt quad strengthening, functional movement pattern improvements.   Scot Jun, PT, DPT, OCS, ATC 04/08/22  4:33 PM

## 2022-04-09 ENCOUNTER — Ambulatory Visit: Payer: PPO | Admitting: Orthopaedic Surgery

## 2022-04-12 ENCOUNTER — Ambulatory Visit (INDEPENDENT_AMBULATORY_CARE_PROVIDER_SITE_OTHER): Payer: PPO | Admitting: Physical Therapy

## 2022-04-12 ENCOUNTER — Encounter: Payer: Self-pay | Admitting: Physical Therapy

## 2022-04-12 DIAGNOSIS — R293 Abnormal posture: Secondary | ICD-10-CM

## 2022-04-12 DIAGNOSIS — M6281 Muscle weakness (generalized): Secondary | ICD-10-CM

## 2022-04-12 DIAGNOSIS — R2689 Other abnormalities of gait and mobility: Secondary | ICD-10-CM | POA: Diagnosis not present

## 2022-04-12 DIAGNOSIS — M542 Cervicalgia: Secondary | ICD-10-CM | POA: Diagnosis not present

## 2022-04-12 NOTE — Therapy (Signed)
OUTPATIENT PHYSICAL THERAPY TREATMENT NOTE  Patient Name: Claudia [name] MRN: [id number] DOB:May 31, 1934, 88 y.o., female Today's Date: 04/12/2022   END OF SESSION:   PT End of Session - 04/12/22 1307     Visit Number 16    Number of Visits 25    Date for PT Re-Evaluation 05/17/22    Authorization Type [employer] $15 copay    Progress Note Due on Visit 24   did last one at #14   PT Start Time 1300    PT Stop Time 1345    PT Time Calculation (min) 45 min    Activity Tolerance Patient tolerated treatment well    Behavior During Therapy WFL for tasks assessed/performed                      Past Medical History:  Diagnosis Date   Abnormality of gait    Brachial neuritis or radiculitis NOS    Degeneration of lumbar or lumbosacral intervertebral disc    Dysrhythmia    Essential hypertension, benign    Gouty arthropathy    Lumbago    Obesity    Osteoarthritis    Pure hypercholesterolemia    Unspecified hypothyroidism    Past Surgical History:  Procedure Laterality Date   ABDOMINAL HYSTERECTOMY  03/13/2010   ANTERIOR CERVICAL DECOMP/DISCECTOMY FUSION N/A 10/19/2021   Procedure: C3-4, C4-5, C5-6 ANTERIOR CERVICAL DISCECTOMY FUSION, ALLOGRAFT, PLATE;  Surgeon: [name], MD;  Location: [facility];  Service: Orthopedics;  Laterality: N/A;   BACK SURGERY     LUMBAR LAMINECTOMY  03/13/2010   right knee replacement  03/13/2010   Patient Active Problem List   Diagnosis Date Noted   Deficiency anemia 02/15/2022   Vitamin B12 deficiency neuropathy ([place]) 02/15/2022   Hypercalcemia 02/15/2022   S/P cervical spinal fusion 11/10/2021   Cervical stricture or stenosis 10/20/2021   Cervical spinal stenosis 10/19/2021   Meningioma ([place]) 08/24/2021   PAF (paroxysmal atrial fibrillation) ([place]) 11/03/2020   Stage 3b chronic kidney disease ([place]) 05/06/2020   Chronic left-sided low back pain without sciatica 12/25/2018   Spinal stenosis in cervical region 05/12/2016    Therapeutic opioid-induced constipation ([facility]) 01/01/2016   Chronic idiopathic constipation 09/11/2013   Lumbosacral spondylosis without myelopathy 04/09/2013   Postlaminectomy syndrome, lumbar region 04/09/2013   Anticoagulation management encounter 02/01/2012   Neuropathy, peripheral 08/04/2011   Pure hypercholesterolemia 08/04/2011   Essential hypertension, benign 08/04/2011   Hypothyroidism 08/04/2011   DJD (degenerative joint disease) of knee 08/04/2011   Gout 08/04/2011      THERAPY DIAG:  Cervicalgia  Other abnormalities of gait and mobility  Muscle weakness (generalized)  Abnormal posture  PCP: Janith Lima. MD   REFERRING PROVIDER: [name], MD   REFERRING DIAG: Z98.1 (ICD-10-CM) - S/P cervical spinal fusion    Rationale for Evaluation and Treatment Rehabilitation   ONSET DATE: Dec 2022   SUBJECTIVE:  SUBJECTIVE STATEMENT: She denies pain, feels overall she is walking a little better.   PERTINENT HISTORY:  History of 10/19/21 C3-4 C4-5 C5-6 ACDF with soft collar PMH bracial neuritis, abnormal gait, Degeneration of lumbar, HTN, gouty arthropathy, OA, lumbar laminectomy 2011, Rt TKA   PAIN:  No pain upon arrival.    PRECAUTIONS: None   WEIGHT BEARING RESTRICTIONS No   FALLS:  Has patient fallen in last 6 months? Maybe 1 fall in last 6 months.  Several falls within last year.    LIVING ENVIRONMENT: Lives with:lives alone Lives in: House/apartment Stairs: 3 steps to enter deck, rail on Lt going up.  No stairs in house.  Has following equipment at home: FWW, cane   OCCUPATION: retired    PLOF: Independent  , cooking/cleaning, shopping/grocery store   [place] stronger, walking independently.     OBJECTIVE:    PATIENT SURVEYS:   01/25/2022 No foto - incorrect set up    COGNITION: 01/25/2022 Overall cognitive status: Within functional limits for tasks assessed     SENSATION: 01/25/2022 not tested today   POSTURE:  01/25/2022 rounded shoulders, forward head, increased thoracic kyphosis, and flexed trunk    PALPATION: 01/25/2022 no specific tenderness noted in screening for balance.       MMT:            01/25/2022:  Seated resisted static testing hip abduction Rt: strong/painless. Lt moderate strength, painless (held MMT due to difficulty in bed mobility positioning MMT Right 01/25/2022 Left 01/25/2022 Left 02/22/2022 Left 03/11/2022 Left 03/16/22 Left 04/01/2022 Left 04/05/22  Shoulder flexion           Shoulder extension           Shoulder abduction           Shoulder adduction           Shoulder extension           Shoulder internal rotation           Shoulder external rotation                                   Hip flexion 5/5 4/5 4/5 4/5 4/5 4+/5 4+  Hip extension           Hip abduction           Knee flexion 5/5 5/5       Knee extension  5/5 5/5    4+/5 4+  Ankle DF 5/5 4/5  4/5 4+/5     (Blank rows = not tested)   CERVICAL SPECIAL TESTS:  01/25/2022 none performed   FUNCTIONAL TESTS:  04/05/22 TUG 38.5s seconds with FWW 03/22/22: TUG 44 seconds C FWW (done at very end of session) 03/08/2022:   TUG:  41.76 seconds c FWW 01/25/2022        TUG:   71 seconds c FWW   03/22/2022  Standardized Balance Assessment  Standardized Balance Assessment Berg Balance Test  Berg Balance Test  Sit to Stand 3  Standing Unsupported 3  Sitting with Back Unsupported but Feet Supported on Floor or Stool 4  Stand to Sit 3  Transfers 3  Standing Unsupported with Eyes Closed 3  Standing Unsupported with Feet Together 3  From Standing, Reach Forward with Outstretched Arm 3  From Standing Position, Pick up Object from Floor 3  From Standing Position, Turn to Look Behind  Over each Shoulder 2  Turn 360 Degrees 1   Standing Unsupported, Alternately Place Feet on Step/Stool 0  Standing Unsupported, One Foot in Front 1  Standing on One Leg 0  Total Score 32        [facility] PT Assessment - 01/25/22 0001                Standardized Balance Assessment    Standardized Balance Assessment Berg Balance Test          Berg Balance Test    Sit to Stand Able to stand using hands after several tries     Standing Unsupported Able to stand 2 minutes with supervision     Sitting with Back Unsupported but Feet Supported on Floor or Stool Able to sit safely and securely 2 minutes     Stand to Sit Uses backs of legs against chair to control descent     Transfers Able to transfer safely, definite need of hands     Standing Unsupported with Eyes Closed Able to stand 3 seconds     Standing Unsupported with Feet Together Needs help to attain position but able to stand for 30 seconds with feet together     From Standing, Reach Forward with Outstretched Arm Can reach forward >5 cm safely (2")     From Standing Position, Pick up Object from Floor Unable to pick up and needs supervision     From Standing Position, Turn to Look Behind Over each Shoulder Needs assist to keep from losing balance and falling     Turn 360 Degrees Needs assistance while turning     Standing Unsupported, Alternately Place Feet on Step/Stool Needs assistance to keep from falling or unable to try     Standing Unsupported, One Foot in Front Needs help to step but can hold 15 seconds     Standing on One Leg Unable to try or needs assist to prevent fall     Total Score 21                GAIT ASSESSMENT: 03/18/2022:  FWW into clinic c mod independent, decreased gait speed.  SPC use in Rt UE c CGA 33 ft today c cues for sequencing.    TODAY'S TREATMENT:  04/12/2022 Therex: Nustep lvl 6 4 mins, Lvl 5 6 mins (10 mins total) Leg press Double leg 56 lbs 3x 10, single leg Lt 31 lbs x 15, single leg Rt 37 lbs x 15 Seated SLR X 10 bilat Seated LAQ X  10 bilat Sit to stand to sit 20 inch chair s UE assist x 10  Neuro Re-ed Standing feet together eyes open 2 mins c SBA, eyes closed 20 seconds X 3 min A required to prevent loss of balance Fwd BIG inspired stepping c target on ground for distance x 10 bilateral c SBA in min A and single hand on walker (rt hand)  04/08/2022 Therex: Nustep lvl 6 4 mins, Lvl 5 6 mins (10 mins total) Leg press Double leg 50 lbs x 15, single leg Lt 31 lbs x 15, single leg Rt 37 lbs x 15  Sit to stand to sit 20 inch chair s UE assist x 10  Neuro Re-ed Standing feet together eyes open 2 mins c SBA, eyes closed 20 seconds min A required to prevent loss of balance Fwd BIG inspired stepping c target on ground for distance x 15 bilateral c SBA in min A and single hand on walker (rt  hand)   04/05/2022: Therex: Nustep Lvl 6 3 mins, Lvl 5 8 mins Seated SLR 2 x 10 bilateral slow focus   Neuro Re-ed Feet together stance  eyes open with head turns X 10 each sie Feet together eyes closed 15 sec each X 5 c min A required to prevent loss of balance Modified tandem stance 45 seconds x 2 bilateral c SBA to occasional min A  TherActivity Leg press Double leg 50 lbs x 15 (cues to avoid hyperextension Lt knee), single leg Rt x15 37 lbs, single leg Lt x15 25 lbs  TUG test, see above for details    PATIENT EDUCATION:  03/08/2022 Education details: HEP progression Person educated: Patient Education method: Consulting civil engineer, Media planner, Verbal cues, and Handouts Education comprehension: verbalized understanding, returned demonstration, and verbal cues required     HOME EXERCISE PROGRAM: Access Code: [email or web address] URL: [email or web address] Date: 03/08/2022 Prepared by: Scot Jun  Exercises - Seated March  - 1-2 x daily - 7 x weekly - 1-2 sets - 10 reps - Sit to Stand  - 2-3 x daily - 7 x weekly - 1 sets - 5-10 reps - Seated Long Arc Quad  - 2-3 x daily - 7 x weekly - 1-2 sets - 10 reps - 2 hold -  Standing Scapular Retraction  - 5 x daily - 7 x weekly - 1 sets - 5 reps - 5 second hold - Heel Toe Raises with Counter Support  - 1-2 x daily - 7 x weekly - 1-2 sets - 10 reps - Standing Tandem Balance with Counter Support  - 1-2 x daily - 7 x weekly - 1 sets - 10 reps   ASSESSMENT:   CLINICAL IMPRESSION: We continued to work to improve leg strength and abilities for gait and balance with good overall tolerance. She is slowly improving with PT and she relays she was able to drive a short distance with supervision. Continued skilled PT services medically necessary at this time.    OBJECTIVE IMPAIRMENTS Abnormal gait, decreased activity tolerance, decreased balance, decreased endurance, decreased mobility, difficulty walking, decreased strength, impaired perceived functional ability, impaired flexibility, improper body mechanics, postural dysfunction, and pain.    ACTIVITY LIMITATIONS carrying, lifting, bending, standing, squatting, stairs, transfers, bed mobility, reach over head, and locomotion level   PARTICIPATION LIMITATIONS: meal prep, cleaning, interpersonal relationship, driving, shopping, and community activity   PERSONAL FACTORS   History of 10/19/21 C3-4 C4-5 C5-6 ACDF with soft collar PMH bracial neuritis, abnormal gait, Degeneration of lumbar, HTN, gouty arthropathy, OA, lumbar laminectomy 2011, Rt TKA  are also affecting patient's functional outcome.    REHAB POTENTIAL: Good   CLINICAL DECISION MAKING: Stable/uncomplicated   EVALUATION COMPLEXITY: Low     GOALS: Goals reviewed with patient? Yes   Short term PT Goals (target date for Short term goals are 3 weeks 02/15/2022) Patient will demonstrate independent use of home exercise program to maintain progress from in clinic treatments. Goal status: MET   Long term PT goals (target dates for all long term goals are 10 weeks  05/17/2022 )   1. Patient will demonstrate/report pain at worst less than or equal to 2/10 to  facilitate minimal limitation in daily activity secondary to pain symptoms. Goal status: MET 04/05/22   2. Patient will demonstrate independent use of home exercise program to facilitate ability to maintain/progress functional gains from skilled physical therapy services. Goal status: on going assessed 04/05/2022   3. Patient will demonstrate bilateral hip MMT 5/5 throughout to  facilitate improved transfers, ambulation towards independence.  Goal status:on going assessed 04/05/22   4.  Patient will demonstrate BERG testing > or = 45 to indicate reduced fall risk.  Goal status: on going assessed 04/05/2022   5.  Patient will demonstrate TUG c LRAD < or = 20 seconds to indicate reduced fall risk/ improved community ambulation.    Goal status: on going assessed 10/92023   6.  Patient will demonstrate ability to ambulation c LRAD (SPC, independent) community distances > 300 ft.   Goal status: on going assessed 04/05/2022       PLAN: PT FREQUENCY: 1-2x/week   PT DURATION: 6 more  weeks from recert   PLANNED INTERVENTIONS: Therapeutic exercises, Therapeutic activity, Neuro Muscular re-education, Balance training, Gait training, Patient/Family education, Joint mobilization, Stair training, DME instructions, Dry Needling, Electrical stimulation, Cryotherapy, Moist heat, Taping, Ultrasound, Ionotophoresis 4mg /ml Dexamethasone, and Manual therapy.  All included unless contraindicated   PLAN FOR NEXT SESSION: Continue Lt quad strengthening, functional movement pattern improvements.   Elsie Ra, PT, DPT 04/12/22 1:41 PM

## 2022-04-14 ENCOUNTER — Other Ambulatory Visit: Payer: Self-pay | Admitting: Internal Medicine

## 2022-04-14 DIAGNOSIS — M47817 Spondylosis without myelopathy or radiculopathy, lumbosacral region: Secondary | ICD-10-CM

## 2022-04-14 DIAGNOSIS — M170 Bilateral primary osteoarthritis of knee: Secondary | ICD-10-CM

## 2022-04-14 DIAGNOSIS — M961 Postlaminectomy syndrome, not elsewhere classified: Secondary | ICD-10-CM

## 2022-04-15 ENCOUNTER — Encounter: Payer: Self-pay | Admitting: Physical Therapy

## 2022-04-15 ENCOUNTER — Ambulatory Visit (INDEPENDENT_AMBULATORY_CARE_PROVIDER_SITE_OTHER): Payer: PPO | Admitting: Physical Therapy

## 2022-04-15 DIAGNOSIS — R293 Abnormal posture: Secondary | ICD-10-CM

## 2022-04-15 DIAGNOSIS — M542 Cervicalgia: Secondary | ICD-10-CM | POA: Diagnosis not present

## 2022-04-15 DIAGNOSIS — R2689 Other abnormalities of gait and mobility: Secondary | ICD-10-CM | POA: Diagnosis not present

## 2022-04-15 DIAGNOSIS — M6281 Muscle weakness (generalized): Secondary | ICD-10-CM

## 2022-04-15 NOTE — Therapy (Signed)
OUTPATIENT PHYSICAL THERAPY TREATMENT NOTE  Patient Name: Claudia [name] MRN: [id number] DOB:Apr 24, 1934, 88 y.o., female Today's Date: 04/15/2022   END OF SESSION:   PT End of Session - 04/15/22 1606     Visit Number 17    Number of Visits 25    Date for PT Re-Evaluation 05/17/22    Authorization Type [employer] $15 copay    Progress Note Due on Visit 24   did last one at #14   PT Start Time 1555    PT Stop Time 1640    PT Time Calculation (min) 45 min    Activity Tolerance Patient tolerated treatment well    Behavior During Therapy WFL for tasks assessed/performed                      Past Medical History:  Diagnosis Date   Abnormality of gait    Brachial neuritis or radiculitis NOS    Degeneration of lumbar or lumbosacral intervertebral disc    Dysrhythmia    Essential hypertension, benign    Gouty arthropathy    Lumbago    Obesity    Osteoarthritis    Pure hypercholesterolemia    Unspecified hypothyroidism    Past Surgical History:  Procedure Laterality Date   ABDOMINAL HYSTERECTOMY  03/13/2010   ANTERIOR CERVICAL DECOMP/DISCECTOMY FUSION N/A 10/19/2021   Procedure: C3-4, C4-5, C5-6 ANTERIOR CERVICAL DISCECTOMY FUSION, ALLOGRAFT, PLATE;  Surgeon: [name], MD;  Location: [facility];  Service: Orthopedics;  Laterality: N/A;   BACK SURGERY     LUMBAR LAMINECTOMY  03/13/2010   right knee replacement  03/13/2010   Patient Active Problem List   Diagnosis Date Noted   Deficiency anemia 02/15/2022   Vitamin B12 deficiency neuropathy ([place]) 02/15/2022   Hypercalcemia 02/15/2022   S/P cervical spinal fusion 11/10/2021   Cervical stricture or stenosis 10/20/2021   Cervical spinal stenosis 10/19/2021   Meningioma ([place]) 08/24/2021   PAF (paroxysmal atrial fibrillation) ([place]) 11/03/2020   Stage 3b chronic kidney disease ([place]) 05/06/2020   Chronic left-sided low back pain without sciatica 12/25/2018   Spinal stenosis in cervical region 05/12/2016    Therapeutic opioid-induced constipation ([facility]) 01/01/2016   Chronic idiopathic constipation 09/11/2013   Lumbosacral spondylosis without myelopathy 04/09/2013   Postlaminectomy syndrome, lumbar region 04/09/2013   Anticoagulation management encounter 02/01/2012   Neuropathy, peripheral 08/04/2011   Pure hypercholesterolemia 08/04/2011   Essential hypertension, benign 08/04/2011   Hypothyroidism 08/04/2011   DJD (degenerative joint disease) of knee 08/04/2011   Gout 08/04/2011      THERAPY DIAG:  Cervicalgia  Other abnormalities of gait and mobility  Muscle weakness (generalized)  Abnormal posture  PCP: Janith Lima. MD   REFERRING PROVIDER: [name], MD   REFERRING DIAG: Z98.1 (ICD-10-CM) - S/P cervical spinal fusion    Rationale for Evaluation and Treatment Rehabilitation   ONSET DATE: Dec 2022   SUBJECTIVE:  SUBJECTIVE STATEMENT: She denies pain, feels she can lift her left leg better.   PERTINENT HISTORY:  History of 10/19/21 C3-4 C4-5 C5-6 ACDF with soft collar PMH bracial neuritis, abnormal gait, Degeneration of lumbar, HTN, gouty arthropathy, OA, lumbar laminectomy 2011, Rt TKA   PAIN:  No pain upon arrival.    PRECAUTIONS: None   WEIGHT BEARING RESTRICTIONS No   FALLS:  Has patient fallen in last 6 months? Maybe 1 fall in last 6 months.  Several falls within last year.    LIVING ENVIRONMENT: Lives with:lives alone Lives in: House/apartment Stairs: 3 steps to enter deck, rail on Lt going up.  No stairs in house.  Has following equipment at home: FWW, cane   OCCUPATION: retired    PLOF: Independent  , cooking/cleaning, shopping/grocery store   [place] stronger, walking independently.     OBJECTIVE:    PATIENT SURVEYS:  01/25/2022  No foto - incorrect set up    COGNITION: 01/25/2022 Overall cognitive status: Within functional limits for tasks assessed     SENSATION: 01/25/2022 not tested today   POSTURE:  01/25/2022 rounded shoulders, forward head, increased thoracic kyphosis, and flexed trunk    PALPATION: 01/25/2022 no specific tenderness noted in screening for balance.       MMT:            01/25/2022:  Seated resisted static testing hip abduction Rt: strong/painless. Lt moderate strength, painless (held MMT due to difficulty in bed mobility positioning MMT Right 01/25/2022 Left 01/25/2022 Left 02/22/2022 Left 03/11/2022 Left 03/16/22 Left 04/01/2022 Left 04/05/22  Shoulder flexion           Shoulder extension           Shoulder abduction           Shoulder adduction           Shoulder extension           Shoulder internal rotation           Shoulder external rotation                                   Hip flexion 5/5 4/5 4/5 4/5 4/5 4+/5 4+  Hip extension           Hip abduction           Knee flexion 5/5 5/5       Knee extension  5/5 5/5    4+/5 4+  Ankle DF 5/5 4/5  4/5 4+/5     (Blank rows = not tested)   CERVICAL SPECIAL TESTS:  01/25/2022 none performed   FUNCTIONAL TESTS:  04/05/22 TUG 38.5s seconds with FWW 03/22/22: TUG 44 seconds C FWW (done at very end of session) 03/08/2022:   TUG:  41.76 seconds c FWW 01/25/2022        TUG:   71 seconds c FWW   03/22/2022  Standardized Balance Assessment  Standardized Balance Assessment Berg Balance Test  Berg Balance Test  Sit to Stand 3  Standing Unsupported 3  Sitting with Back Unsupported but Feet Supported on Floor or Stool 4  Stand to Sit 3  Transfers 3  Standing Unsupported with Eyes Closed 3  Standing Unsupported with Feet Together 3  From Standing, Reach Forward with Outstretched Arm 3  From Standing Position, Pick up Object from Floor 3  From Standing Position, Turn to Look Behind  Over each Shoulder 2  Turn 360 Degrees 1  Standing  Unsupported, Alternately Place Feet on Step/Stool 0  Standing Unsupported, One Foot in Front 1  Standing on One Leg 0  Total Score 32        [facility] PT Assessment - 01/25/22 0001                Standardized Balance Assessment    Standardized Balance Assessment Berg Balance Test          Berg Balance Test    Sit to Stand Able to stand using hands after several tries     Standing Unsupported Able to stand 2 minutes with supervision     Sitting with Back Unsupported but Feet Supported on Floor or Stool Able to sit safely and securely 2 minutes     Stand to Sit Uses backs of legs against chair to control descent     Transfers Able to transfer safely, definite need of hands     Standing Unsupported with Eyes Closed Able to stand 3 seconds     Standing Unsupported with Feet Together Needs help to attain position but able to stand for 30 seconds with feet together     From Standing, Reach Forward with Outstretched Arm Can reach forward >5 cm safely (2")     From Standing Position, Pick up Object from Floor Unable to pick up and needs supervision     From Standing Position, Turn to Look Behind Over each Shoulder Needs assist to keep from losing balance and falling     Turn 360 Degrees Needs assistance while turning     Standing Unsupported, Alternately Place Feet on Step/Stool Needs assistance to keep from falling or unable to try     Standing Unsupported, One Foot in Front Needs help to step but can hold 15 seconds     Standing on One Leg Unable to try or needs assist to prevent fall     Total Score 21                GAIT ASSESSMENT: 03/18/2022:  FWW into clinic c mod independent, decreased gait speed.  SPC use in Rt UE c CGA 33 ft today c cues for sequencing.    TODAY'S TREATMENT:  04/15/2022 Therex: Nustep lvl 6 4 mins, Lvl 5 6 mins (10 mins total) Recumbent bike L2 X 4 min Leg press Double leg 56 lbs 3x 10, single leg Lt 31 lbs x 15, single leg Rt 37 lbs x 15 Seated SLR X 10  bilat Seated marches X 10 bilat Seated LAQ X 10 bilat Sit to stand to sit 22.5 inches without UE assist x 10  Neuro Re-ed Standing inside RW with feet apart and eyes closed 20 seconds X 4 min A required to prevent loss of balance Fwd BIG inspired stepping c target on ground for distance x 10 bilateral c SBA in min A and single hand on walker (rt hand)  04/12/2022 Therex: Nustep lvl 6 4 mins, Lvl 5 6 mins (10 mins total) Leg press Double leg 56 lbs 3x 10, single leg Lt 31 lbs x 15, single leg Rt 37 lbs x 15 Seated SLR X 10 bilat Seated LAQ X 10 bilat Sit to stand to sit 20 inch chair s UE assist x 10  Neuro Re-ed Standing feet together eyes open 2 mins c SBA, eyes closed 20 seconds X 3 min A required to prevent loss of balance Fwd BIG inspired stepping c  target on ground for distance x 10 bilateral c SBA in min A and single hand on walker (rt hand)  04/08/2022 Therex: Nustep lvl 6 4 mins, Lvl 5 6 mins (10 mins total) Leg press Double leg 50 lbs x 15, single leg Lt 31 lbs x 15, single leg Rt 37 lbs x 15  Sit to stand to sit 20 inch chair s UE assist x 10  Neuro Re-ed Standing feet together eyes open 2 mins c SBA, eyes closed 20 seconds min A required to prevent loss of balance Fwd BIG inspired stepping c target on ground for distance x 15 bilateral c SBA in min A and single hand on walker (rt hand)      PATIENT EDUCATION:  03/08/2022 Education details: HEP progression Person educated: Patient Education method: Consulting civil engineer, Media planner, Verbal cues, and Handouts Education comprehension: verbalized understanding, returned demonstration, and verbal cues required     HOME EXERCISE PROGRAM: Access Code: [email or web address] URL: [email or web address] Date: 03/08/2022 Prepared by: Scot Jun  Exercises - Seated March  - 1-2 x daily - 7 x weekly - 1-2 sets - 10 reps - Sit to Stand  - 2-3 x daily - 7 x weekly - 1 sets - 5-10 reps - Seated Long Arc Quad  - 2-3 x daily  - 7 x weekly - 1-2 sets - 10 reps - 2 hold - Standing Scapular Retraction  - 5 x daily - 7 x weekly - 1 sets - 5 reps - 5 second hold - Heel Toe Raises with Counter Support  - 1-2 x daily - 7 x weekly - 1-2 sets - 10 reps - Standing Tandem Balance with Counter Support  - 1-2 x daily - 7 x weekly - 1 sets - 10 reps   ASSESSMENT:   CLINICAL IMPRESSION: I did notice she was able to lift her left leg better to get on/off exercise machines today. We will continue to work to improve her strength, balance, and standing tolerance for ADL's and ambulation as tolerated.    OBJECTIVE IMPAIRMENTS Abnormal gait, decreased activity tolerance, decreased balance, decreased endurance, decreased mobility, difficulty walking, decreased strength, impaired perceived functional ability, impaired flexibility, improper body mechanics, postural dysfunction, and pain.    ACTIVITY LIMITATIONS carrying, lifting, bending, standing, squatting, stairs, transfers, bed mobility, reach over head, and locomotion level   PARTICIPATION LIMITATIONS: meal prep, cleaning, interpersonal relationship, driving, shopping, and community activity   PERSONAL FACTORS   History of 10/19/21 C3-4 C4-5 C5-6 ACDF with soft collar PMH bracial neuritis, abnormal gait, Degeneration of lumbar, HTN, gouty arthropathy, OA, lumbar laminectomy 2011, Rt TKA  are also affecting patient's functional outcome.    REHAB POTENTIAL: Good   CLINICAL DECISION MAKING: Stable/uncomplicated   EVALUATION COMPLEXITY: Low     GOALS: Goals reviewed with patient? Yes   Short term PT Goals (target date for Short term goals are 3 weeks 02/15/2022) Patient will demonstrate independent use of home exercise program to maintain progress from in clinic treatments. Goal status: MET   Long term PT goals (target dates for all long term goals are 10 weeks  05/17/2022 )   1. Patient will demonstrate/report pain at worst less than or equal to 2/10 to facilitate minimal  limitation in daily activity secondary to pain symptoms. Goal status: MET 04/05/22   2. Patient will demonstrate independent use of home exercise program to facilitate ability to maintain/progress functional gains from skilled physical therapy services. Goal status: on going assessed 04/05/2022   3. Patient will  demonstrate bilateral hip MMT 5/5 throughout to facilitate improved transfers, ambulation towards independence.  Goal status:on going assessed 04/05/22   4.  Patient will demonstrate BERG testing > or = 45 to indicate reduced fall risk.  Goal status: on going assessed 04/05/2022   5.  Patient will demonstrate TUG c LRAD < or = 20 seconds to indicate reduced fall risk/ improved community ambulation.    Goal status: on going assessed 10/92023   6.  Patient will demonstrate ability to ambulation c LRAD (SPC, independent) community distances > 300 ft.   Goal status: on going assessed 04/05/2022       PLAN: PT FREQUENCY: 1-2x/week   PT DURATION: 6 more  weeks from recert   PLANNED INTERVENTIONS: Therapeutic exercises, Therapeutic activity, Neuro Muscular re-education, Balance training, Gait training, Patient/Family education, Joint mobilization, Stair training, DME instructions, Dry Needling, Electrical stimulation, Cryotherapy, Moist heat, Taping, Ultrasound, Ionotophoresis 4mg /ml Dexamethasone, and Manual therapy.  All included unless contraindicated   PLAN FOR NEXT SESSION: Continue Lt quad strengthening, functional movement pattern improvements.   Elsie Ra, PT, DPT 04/15/22 4:07 PM

## 2022-04-19 ENCOUNTER — Encounter: Payer: Self-pay | Admitting: Physical Therapy

## 2022-04-19 ENCOUNTER — Ambulatory Visit: Payer: PPO | Admitting: Physical Therapy

## 2022-04-19 DIAGNOSIS — M545 Low back pain, unspecified: Secondary | ICD-10-CM

## 2022-04-19 DIAGNOSIS — R293 Abnormal posture: Secondary | ICD-10-CM

## 2022-04-19 DIAGNOSIS — M6281 Muscle weakness (generalized): Secondary | ICD-10-CM | POA: Diagnosis not present

## 2022-04-19 DIAGNOSIS — G8929 Other chronic pain: Secondary | ICD-10-CM

## 2022-04-19 DIAGNOSIS — R2689 Other abnormalities of gait and mobility: Secondary | ICD-10-CM

## 2022-04-19 DIAGNOSIS — M542 Cervicalgia: Secondary | ICD-10-CM | POA: Diagnosis not present

## 2022-04-19 NOTE — Therapy (Signed)
OUTPATIENT PHYSICAL THERAPY TREATMENT NOTE  Patient Name: Claudia [name] MRN: [id number] DOB:07-Jul-1933, 88 y.o., female Today's Date: 04/19/2022   END OF SESSION:   PT End of Session - 04/19/22 1307     Visit Number 18    Number of Visits 25    Date for PT Re-Evaluation 05/17/22    Authorization Type [employer] $15 copay    Progress Note Due on Visit 24   did last one at #14   PT Start Time 1300    PT Stop Time 1344    PT Time Calculation (min) 44 min    Activity Tolerance Patient tolerated treatment well    Behavior During Therapy WFL for tasks assessed/performed                      Past Medical History:  Diagnosis Date   Abnormality of gait    Brachial neuritis or radiculitis NOS    Degeneration of lumbar or lumbosacral intervertebral disc    Dysrhythmia    Essential hypertension, benign    Gouty arthropathy    Lumbago    Obesity    Osteoarthritis    Pure hypercholesterolemia    Unspecified hypothyroidism    Past Surgical History:  Procedure Laterality Date   ABDOMINAL HYSTERECTOMY  03/13/2010   ANTERIOR CERVICAL DECOMP/DISCECTOMY FUSION N/A 10/19/2021   Procedure: C3-4, C4-5, C5-6 ANTERIOR CERVICAL DISCECTOMY FUSION, ALLOGRAFT, PLATE;  Surgeon: [name], MD;  Location: [facility];  Service: Orthopedics;  Laterality: N/A;   BACK SURGERY     LUMBAR LAMINECTOMY  03/13/2010   right knee replacement  03/13/2010   Patient Active Problem List   Diagnosis Date Noted   Deficiency anemia 02/15/2022   Vitamin B12 deficiency neuropathy ([place]) 02/15/2022   Hypercalcemia 02/15/2022   S/P cervical spinal fusion 11/10/2021   Cervical stricture or stenosis 10/20/2021   Cervical spinal stenosis 10/19/2021   Meningioma ([place]) 08/24/2021   PAF (paroxysmal atrial fibrillation) ([place]) 11/03/2020   Stage 3b chronic kidney disease ([place]) 05/06/2020   Chronic left-sided low back pain without sciatica 12/25/2018   Spinal stenosis in cervical region 05/12/2016    Therapeutic opioid-induced constipation ([facility]) 01/01/2016   Chronic idiopathic constipation 09/11/2013   Lumbosacral spondylosis without myelopathy 04/09/2013   Postlaminectomy syndrome, lumbar region 04/09/2013   Anticoagulation management encounter 02/01/2012   Neuropathy, peripheral 08/04/2011   Pure hypercholesterolemia 08/04/2011   Essential hypertension, benign 08/04/2011   Hypothyroidism 08/04/2011   DJD (degenerative joint disease) of knee 08/04/2011   Gout 08/04/2011      THERAPY DIAG:  Cervicalgia  Other abnormalities of gait and mobility  Muscle weakness (generalized)  Abnormal posture  Chronic bilateral low back pain without sciatica  PCP: Janith Lima. MD   REFERRING PROVIDER: [name], MD   REFERRING DIAG: Z98.1 (ICD-10-CM) - S/P cervical spinal fusion    Rationale for Evaluation and Treatment Rehabilitation   ONSET DATE: Dec 2022   SUBJECTIVE:  SUBJECTIVE STATEMENT: She denies pain, feels she needs more PT as she can see some improvement.   PERTINENT HISTORY:  History of 10/19/21 C3-4 C4-5 C5-6 ACDF with soft collar PMH bracial neuritis, abnormal gait, Degeneration of lumbar, HTN, gouty arthropathy, OA, lumbar laminectomy 2011, Rt TKA   PAIN:  No pain upon arrival.    PRECAUTIONS: None   WEIGHT BEARING RESTRICTIONS No   FALLS:  Has patient fallen in last 6 months? Maybe 1 fall in last 6 months.  Several falls within last year.    LIVING ENVIRONMENT: Lives with:lives alone Lives in: House/apartment Stairs: 3 steps to enter deck, rail on Lt going up.  No stairs in house.  Has following equipment at home: FWW, cane   OCCUPATION: retired    PLOF: Independent  , cooking/cleaning, shopping/grocery store   [place] stronger,  walking independently.     OBJECTIVE:    PATIENT SURVEYS:  01/25/2022 No foto - incorrect set up    COGNITION: 01/25/2022 Overall cognitive status: Within functional limits for tasks assessed     SENSATION: 01/25/2022 not tested today   POSTURE:  01/25/2022 rounded shoulders, forward head, increased thoracic kyphosis, and flexed trunk    PALPATION: 01/25/2022 no specific tenderness noted in screening for balance.       MMT:            01/25/2022:  Seated resisted static testing hip abduction Rt: strong/painless. Lt moderate strength, painless (held MMT due to difficulty in bed mobility positioning MMT Right 01/25/2022 Left 01/25/2022 Left 02/22/2022 Left 03/11/2022 Left 03/16/22 Left 04/01/2022 Left 04/05/22  Shoulder flexion           Shoulder extension           Shoulder abduction           Shoulder adduction           Shoulder extension           Shoulder internal rotation           Shoulder external rotation                                   Hip flexion 5/5 4/5 4/5 4/5 4/5 4+/5 4+  Hip extension           Hip abduction           Knee flexion 5/5 5/5       Knee extension  5/5 5/5    4+/5 4+  Ankle DF 5/5 4/5  4/5 4+/5     (Blank rows = not tested)   CERVICAL SPECIAL TESTS:  01/25/2022 none performed   FUNCTIONAL TESTS:  04/05/22 TUG 38.5s seconds with FWW 03/22/22: TUG 44 seconds C FWW (done at very end of session) 03/08/2022:   TUG:  41.76 seconds c FWW 01/25/2022        TUG:   71 seconds c FWW   03/22/2022  Standardized Balance Assessment  Standardized Balance Assessment Berg Balance Test  Berg Balance Test  Sit to Stand 3  Standing Unsupported 3  Sitting with Back Unsupported but Feet Supported on Floor or Stool 4  Stand to Sit 3  Transfers 3  Standing Unsupported with Eyes Closed 3  Standing Unsupported with Feet Together 3  From Standing, Reach Forward with Outstretched Arm 3  From Standing Position, Pick up Object from Floor 3  From Standing Position, Turn  to Look Behind Over each Shoulder 2  Turn 360 Degrees 1  Standing Unsupported, Alternately Place Feet on Step/Stool 0  Standing Unsupported, One Foot in Front 1  Standing on One Leg 0  Total Score 32        [facility] PT Assessment - 01/25/22 0001                Standardized Balance Assessment    Standardized Balance Assessment Berg Balance Test          Berg Balance Test    Sit to Stand Able to stand using hands after several tries     Standing Unsupported Able to stand 2 minutes with supervision     Sitting with Back Unsupported but Feet Supported on Floor or Stool Able to sit safely and securely 2 minutes     Stand to Sit Uses backs of legs against chair to control descent     Transfers Able to transfer safely, definite need of hands     Standing Unsupported with Eyes Closed Able to stand 3 seconds     Standing Unsupported with Feet Together Needs help to attain position but able to stand for 30 seconds with feet together     From Standing, Reach Forward with Outstretched Arm Can reach forward >5 cm safely (2")     From Standing Position, Pick up Object from Floor Unable to pick up and needs supervision     From Standing Position, Turn to Look Behind Over each Shoulder Needs assist to keep from losing balance and falling     Turn 360 Degrees Needs assistance while turning     Standing Unsupported, Alternately Place Feet on Step/Stool Needs assistance to keep from falling or unable to try     Standing Unsupported, One Foot in Front Needs help to step but can hold 15 seconds     Standing on One Leg Unable to try or needs assist to prevent fall     Total Score 21                GAIT ASSESSMENT: 03/18/2022:  FWW into clinic c mod independent, decreased gait speed.  SPC use in Rt UE c CGA 33 ft today c cues for sequencing.    TODAY'S TREATMENT:  04/19/2022 Therex: Nustep lvl 6 4 mins, Lvl 5 6 mins (10 mins total) Recumbent bike L3 for 3 min and L2 for 3 min (6 min total) Leg  press Double leg 56 lbs 3x 10, single leg Lt 31 lbs 2 x 15, single leg Rt 37 lbs 2 x 15 Seated SLR X 10 bilat Seated marches X 10 bilat Seated LAQ X 15 bilat Sit to stand to sit 22.5 inches without UE assist x 10  Neuro Re-ed Standing inside RW with feet apart and eyes closed 20 seconds X 4 min A required to prevent loss of balance Fwd BIG inspired stepping c target on ground for distance x 10 bilateral c SBA in min A and single hand on walker (rt hand)  04/15/2022 Therex: Nustep lvl 6 4 mins, Lvl 5 6 mins (10 mins total) Recumbent bike L2 X 4 min Leg press Double leg 56 lbs 3x 10, single leg Lt 31 lbs x 15, single leg Rt 37 lbs x 15 Seated SLR X 10 bilat Seated marches X 10 bilat Seated LAQ X 10 bilat Sit to stand to sit 22.5 inches without UE assist x 10  Neuro Re-ed Standing inside RW with feet apart  and eyes closed 20 seconds X 4 min A required to prevent loss of balance Fwd BIG inspired stepping c target on ground for distance x 10 bilateral c SBA in min A and single hand on walker (rt hand)       PATIENT EDUCATION:  03/08/2022 Education details: HEP progression Person educated: Patient Education method: Consulting civil engineer, Media planner, Verbal cues, and Handouts Education comprehension: verbalized understanding, returned demonstration, and verbal cues required     HOME EXERCISE PROGRAM: Access Code: [email or web address] URL: [email or web address] Date: 03/08/2022 Prepared by: Scot Jun  Exercises - Seated March  - 1-2 x daily - 7 x weekly - 1-2 sets - 10 reps - Sit to Stand  - 2-3 x daily - 7 x weekly - 1 sets - 5-10 reps - Seated Long Arc Quad  - 2-3 x daily - 7 x weekly - 1-2 sets - 10 reps - 2 hold - Standing Scapular Retraction  - 5 x daily - 7 x weekly - 1 sets - 5 reps - 5 second hold - Heel Toe Raises with Counter Support  - 1-2 x daily - 7 x weekly - 1-2 sets - 10 reps - Standing Tandem Balance with Counter Support  - 1-2 x daily - 7 x weekly - 1 sets -  10 reps   ASSESSMENT:   CLINICAL IMPRESSION: She only had one PT visit left scheduled so I encouraged her to make up some more as her PT plan of care is still current and she is making some progress thus further PT is recommended.    OBJECTIVE IMPAIRMENTS Abnormal gait, decreased activity tolerance, decreased balance, decreased endurance, decreased mobility, difficulty walking, decreased strength, impaired perceived functional ability, impaired flexibility, improper body mechanics, postural dysfunction, and pain.    ACTIVITY LIMITATIONS carrying, lifting, bending, standing, squatting, stairs, transfers, bed mobility, reach over head, and locomotion level   PARTICIPATION LIMITATIONS: meal prep, cleaning, interpersonal relationship, driving, shopping, and community activity   PERSONAL FACTORS   History of 10/19/21 C3-4 C4-5 C5-6 ACDF with soft collar PMH bracial neuritis, abnormal gait, Degeneration of lumbar, HTN, gouty arthropathy, OA, lumbar laminectomy 2011, Rt TKA  are also affecting patient's functional outcome.    REHAB POTENTIAL: Good   CLINICAL DECISION MAKING: Stable/uncomplicated   EVALUATION COMPLEXITY: Low     GOALS: Goals reviewed with patient? Yes   Short term PT Goals (target date for Short term goals are 3 weeks 02/15/2022) Patient will demonstrate independent use of home exercise program to maintain progress from in clinic treatments. Goal status: MET   Long term PT goals (target dates for all long term goals are 10 weeks  05/17/2022 )   1. Patient will demonstrate/report pain at worst less than or equal to 2/10 to facilitate minimal limitation in daily activity secondary to pain symptoms. Goal status: MET 04/05/22   2. Patient will demonstrate independent use of home exercise program to facilitate ability to maintain/progress functional gains from skilled physical therapy services. Goal status: on going assessed 04/05/2022   3. Patient will demonstrate bilateral hip  MMT 5/5 throughout to facilitate improved transfers, ambulation towards independence.  Goal status:on going assessed 04/05/22   4.  Patient will demonstrate BERG testing > or = 45 to indicate reduced fall risk.  Goal status: on going assessed 04/05/2022   5.  Patient will demonstrate TUG c LRAD < or = 20 seconds to indicate reduced fall risk/ improved community ambulation.    Goal status: on going assessed 10/92023   6.  Patient will demonstrate ability to ambulation c LRAD (SPC, independent) community distances > 300 ft.   Goal status: on going assessed 04/05/2022       PLAN: PT FREQUENCY: 1-2x/week   PT DURATION: 6 more  weeks from recert   PLANNED INTERVENTIONS: Therapeutic exercises, Therapeutic activity, Neuro Muscular re-education, Balance training, Gait training, Patient/Family education, Joint mobilization, Stair training, DME instructions, Dry Needling, Electrical stimulation, Cryotherapy, Moist heat, Taping, Ultrasound, Ionotophoresis 4mg /ml Dexamethasone, and Manual therapy.  All included unless contraindicated   PLAN FOR NEXT SESSION: Continue Lt quad strengthening, functional movement pattern improvements.   Elsie Ra, PT, DPT 04/19/22 1:08 PM

## 2022-04-22 ENCOUNTER — Encounter: Payer: Self-pay | Admitting: Physical Therapy

## 2022-04-22 ENCOUNTER — Ambulatory Visit (INDEPENDENT_AMBULATORY_CARE_PROVIDER_SITE_OTHER): Payer: PPO | Admitting: Physical Therapy

## 2022-04-22 DIAGNOSIS — G8929 Other chronic pain: Secondary | ICD-10-CM

## 2022-04-22 DIAGNOSIS — M545 Low back pain, unspecified: Secondary | ICD-10-CM | POA: Diagnosis not present

## 2022-04-22 DIAGNOSIS — R293 Abnormal posture: Secondary | ICD-10-CM

## 2022-04-22 DIAGNOSIS — M542 Cervicalgia: Secondary | ICD-10-CM

## 2022-04-22 DIAGNOSIS — R2689 Other abnormalities of gait and mobility: Secondary | ICD-10-CM

## 2022-04-22 DIAGNOSIS — M6281 Muscle weakness (generalized): Secondary | ICD-10-CM

## 2022-04-22 NOTE — Therapy (Signed)
OUTPATIENT PHYSICAL THERAPY TREATMENT NOTE  Patient Name: BARI LEIB MRN: [id number] DOB:01-10-1934, 88 y.o., female Today's Date: 04/22/2022   END OF SESSION:   PT End of Session - 04/22/22 1608     Visit Number 19    Number of Visits 25    Date for PT Re-Evaluation 05/17/22    Authorization Type [employer] $15 copay    Progress Note Due on Visit 24   did last one at #14   PT Start Time 1555    PT Stop Time 1640    PT Time Calculation (min) 45 min    Activity Tolerance Patient tolerated treatment well    Behavior During Therapy WFL for tasks assessed/performed                      Past Medical History:  Diagnosis Date   Abnormality of gait    Brachial neuritis or radiculitis NOS    Degeneration of lumbar or lumbosacral intervertebral disc    Dysrhythmia    Essential hypertension, benign    Gouty arthropathy    Lumbago    Obesity    Osteoarthritis    Pure hypercholesterolemia    Unspecified hypothyroidism    Past Surgical History:  Procedure Laterality Date   ABDOMINAL HYSTERECTOMY  03/13/2010   ANTERIOR CERVICAL DECOMP/DISCECTOMY FUSION N/A 10/19/2021   Procedure: C3-4, C4-5, C5-6 ANTERIOR CERVICAL DISCECTOMY FUSION, ALLOGRAFT, PLATE;  Surgeon: [name], MD;  Location: [facility];  Service: Orthopedics;  Laterality: N/A;   BACK SURGERY     LUMBAR LAMINECTOMY  03/13/2010   right knee replacement  03/13/2010   Patient Active Problem List   Diagnosis Date Noted   Deficiency anemia 02/15/2022   Vitamin B12 deficiency neuropathy ([place]) 02/15/2022   Hypercalcemia 02/15/2022   S/P cervical spinal fusion 11/10/2021   Cervical stricture or stenosis 10/20/2021   Cervical spinal stenosis 10/19/2021   Meningioma ([place]) 08/24/2021   PAF (paroxysmal atrial fibrillation) ([place]) 11/03/2020   Stage 3b chronic kidney disease ([place]) 05/06/2020   Chronic left-sided low back pain without sciatica 12/25/2018   Spinal stenosis in cervical region 05/12/2016    Therapeutic opioid-induced constipation (OIC) 01/01/2016   Chronic idiopathic constipation 09/11/2013   Lumbosacral spondylosis without myelopathy 04/09/2013   Postlaminectomy syndrome, lumbar region 04/09/2013   Anticoagulation management encounter 02/01/2012   Neuropathy, peripheral 08/04/2011   Pure hypercholesterolemia 08/04/2011   Essential hypertension, benign 08/04/2011   Hypothyroidism 08/04/2011   DJD (degenerative joint disease) of knee 08/04/2011   Gout 08/04/2011      THERAPY DIAG:  Cervicalgia  Other abnormalities of gait and mobility  Muscle weakness (generalized)  Abnormal posture  Chronic bilateral low back pain without sciatica  PCP: Janith Lima. MD   REFERRING PROVIDER: [name], MD   REFERRING DIAG: Z98.1 (ICD-10-CM) - S/P cervical spinal fusion    Rationale for Evaluation and Treatment Rehabilitation   ONSET DATE: Dec 2022   SUBJECTIVE:  SUBJECTIVE STATEMENT: No pain just difficulty picking her legs up but this is getting better with PT.   PERTINENT HISTORY:  History of 10/19/21 C3-4 C4-5 C5-6 ACDF with soft collar PMH bracial neuritis, abnormal gait, Degeneration of lumbar, HTN, gouty arthropathy, OA, lumbar laminectomy 2011, Rt TKA   PAIN:  No pain upon arrival.    PRECAUTIONS: None   WEIGHT BEARING RESTRICTIONS No   FALLS:  Has patient fallen in last 6 months? Maybe 1 fall in last 6 months.  Several falls within last year.    LIVING ENVIRONMENT: Lives with:lives alone Lives in: House/apartment Stairs: 3 steps to enter deck, rail on Lt going up.  No stairs in house.  Has following equipment at home: FWW, cane   OCCUPATION: retired    PLOF: Independent  , cooking/cleaning, shopping/grocery store   [place] stronger,  walking independently.     OBJECTIVE:    PATIENT SURVEYS:  01/25/2022 No foto - incorrect set up    COGNITION: 01/25/2022 Overall cognitive status: Within functional limits for tasks assessed     SENSATION: 01/25/2022 not tested today   POSTURE:  01/25/2022 rounded shoulders, forward head, increased thoracic kyphosis, and flexed trunk    PALPATION: 01/25/2022 no specific tenderness noted in screening for balance.       MMT:            01/25/2022:  Seated resisted static testing hip abduction Rt: strong/painless. Lt moderate strength, painless (held MMT due to difficulty in bed mobility positioning MMT Right 01/25/2022 Left 01/25/2022 Left 02/22/2022 Left 03/11/2022 Left 03/16/22 Left 04/01/2022 Left 04/05/22  Shoulder flexion           Shoulder extension           Shoulder abduction           Shoulder adduction           Shoulder extension           Shoulder internal rotation           Shoulder external rotation                                   Hip flexion 5/5 4/5 4/5 4/5 4/5 4+/5 4+  Hip extension           Hip abduction           Knee flexion 5/5 5/5       Knee extension  5/5 5/5    4+/5 4+  Ankle DF 5/5 4/5  4/5 4+/5     (Blank rows = not tested)   CERVICAL SPECIAL TESTS:  01/25/2022 none performed   FUNCTIONAL TESTS:  04/05/22 TUG 38.5s seconds with FWW 03/22/22: TUG 44 seconds C FWW (done at very end of session) 03/08/2022:   TUG:  41.76 seconds c FWW 01/25/2022        TUG:   71 seconds c FWW   03/22/2022  Standardized Balance Assessment  Standardized Balance Assessment Berg Balance Test  Berg Balance Test  Sit to Stand 3  Standing Unsupported 3  Sitting with Back Unsupported but Feet Supported on Floor or Stool 4  Stand to Sit 3  Transfers 3  Standing Unsupported with Eyes Closed 3  Standing Unsupported with Feet Together 3  From Standing, Reach Forward with Outstretched Arm 3  From Standing Position, Pick up Object from Floor 3  From Standing Position,  Turn  to Look Behind Over each Shoulder 2  Turn 360 Degrees 1  Standing Unsupported, Alternately Place Feet on Step/Stool 0  Standing Unsupported, One Foot in Front 1  Standing on One Leg 0  Total Score 32        [facility] PT Assessment - 01/25/22 0001                Standardized Balance Assessment    Standardized Balance Assessment Berg Balance Test          Berg Balance Test    Sit to Stand Able to stand using hands after several tries     Standing Unsupported Able to stand 2 minutes with supervision     Sitting with Back Unsupported but Feet Supported on Floor or Stool Able to sit safely and securely 2 minutes     Stand to Sit Uses backs of legs against chair to control descent     Transfers Able to transfer safely, definite need of hands     Standing Unsupported with Eyes Closed Able to stand 3 seconds     Standing Unsupported with Feet Together Needs help to attain position but able to stand for 30 seconds with feet together     From Standing, Reach Forward with Outstretched Arm Can reach forward >5 cm safely (2")     From Standing Position, Pick up Object from Floor Unable to pick up and needs supervision     From Standing Position, Turn to Look Behind Over each Shoulder Needs assist to keep from losing balance and falling     Turn 360 Degrees Needs assistance while turning     Standing Unsupported, Alternately Place Feet on Step/Stool Needs assistance to keep from falling or unable to try     Standing Unsupported, One Foot in Front Needs help to step but can hold 15 seconds     Standing on One Leg Unable to try or needs assist to prevent fall     Total Score 21                GAIT ASSESSMENT: 03/18/2022:  FWW into clinic c mod independent, decreased gait speed.  SPC use in Rt UE c CGA 33 ft today c cues for sequencing.    TODAY'S TREATMENT:  04/22/2022 Therex: Nustep lvl 6 4 mins, Lvl 5 6 mins (10 mins total) Recumbent bike L3 for 4 min and L2 for 3 min (7 min total) Leg  press Double leg 62# 2X15, single leg Lt 31 lbs 2 x 10, single leg Rt 37 lbs 2 x 10 Seated SLR X 10 bilat Seated marches 1# X 10 bilat Seated LAQ 1# 2X 10 bilat Sit to stand to sit 22.5 inches without UE assist x 10  Neuro Re-ed Standing inside RW with feet apart and eyes closed 20 seconds X 4 min A required to prevent loss of balance Fwd BIG inspired stepping c target on ground for distance x 10 bilateral c SBA in min A and single hand on walker (rt hand)      PATIENT EDUCATION:  03/08/2022 Education details: HEP progression Person educated: Patient Education method: Consulting civil engineer, Media planner, Verbal cues, and Handouts Education comprehension: verbalized understanding, returned demonstration, and verbal cues required     HOME EXERCISE PROGRAM: Access Code: [email or web address] URL: [email or web address] Date: 03/08/2022 Prepared by: Scot Jun  Exercises - Seated March  - 1-2 x daily - 7 x weekly - 1-2 sets - 10 reps - Sit to Stand  -  2-3 x daily - 7 x weekly - 1 sets - 5-10 reps - Seated Long Arc Quad  - 2-3 x daily - 7 x weekly - 1-2 sets - 10 reps - 2 hold - Standing Scapular Retraction  - 5 x daily - 7 x weekly - 1 sets - 5 reps - 5 second hold - Heel Toe Raises with Counter Support  - 1-2 x daily - 7 x weekly - 1-2 sets - 10 reps - Standing Tandem Balance with Counter Support  - 1-2 x daily - 7 x weekly - 1 sets - 10 reps   ASSESSMENT:   CLINICAL IMPRESSION: She was able to progress her strength program some without any complaints. Continued skilled PT indicated to improve leg strength and balance.     OBJECTIVE IMPAIRMENTS Abnormal gait, decreased activity tolerance, decreased balance, decreased endurance, decreased mobility, difficulty walking, decreased strength, impaired perceived functional ability, impaired flexibility, improper body mechanics, postural dysfunction, and pain.    ACTIVITY LIMITATIONS carrying, lifting, bending, standing, squatting, stairs,  transfers, bed mobility, reach over head, and locomotion level   PARTICIPATION LIMITATIONS: meal prep, cleaning, interpersonal relationship, driving, shopping, and community activity   PERSONAL FACTORS   History of 10/19/21 C3-4 C4-5 C5-6 ACDF with soft collar PMH bracial neuritis, abnormal gait, Degeneration of lumbar, HTN, gouty arthropathy, OA, lumbar laminectomy 2011, Rt TKA  are also affecting patient's functional outcome.    REHAB POTENTIAL: Good   CLINICAL DECISION MAKING: Stable/uncomplicated   EVALUATION COMPLEXITY: Low     GOALS: Goals reviewed with patient? Yes   Short term PT Goals (target date for Short term goals are 3 weeks 02/15/2022) Patient will demonstrate independent use of home exercise program to maintain progress from in clinic treatments. Goal status: MET   Long term PT goals (target dates for all long term goals are 10 weeks  05/17/2022 )   1. Patient will demonstrate/report pain at worst less than or equal to 2/10 to facilitate minimal limitation in daily activity secondary to pain symptoms. Goal status: MET 04/05/22   2. Patient will demonstrate independent use of home exercise program to facilitate ability to maintain/progress functional gains from skilled physical therapy services. Goal status: on going assessed 04/05/2022   3. Patient will demonstrate bilateral hip MMT 5/5 throughout to facilitate improved transfers, ambulation towards independence.  Goal status:on going assessed 04/05/22   4.  Patient will demonstrate BERG testing > or = 45 to indicate reduced fall risk.  Goal status: on going assessed 04/05/2022   5.  Patient will demonstrate TUG c LRAD < or = 20 seconds to indicate reduced fall risk/ improved community ambulation.    Goal status: on going assessed 10/92023   6.  Patient will demonstrate ability to ambulation c LRAD (SPC, independent) community distances > 300 ft.   Goal status: on going assessed 04/05/2022       PLAN: PT FREQUENCY:  1-2x/week   PT DURATION: 6 more  weeks from recert   PLANNED INTERVENTIONS: Therapeutic exercises, Therapeutic activity, Neuro Muscular re-education, Balance training, Gait training, Patient/Family education, Joint mobilization, Stair training, DME instructions, Dry Needling, Electrical stimulation, Cryotherapy, Moist heat, Taping, Ultrasound, Ionotophoresis 4mg /ml Dexamethasone, and Manual therapy.  All included unless contraindicated   PLAN FOR NEXT SESSION: Continue Lt quad strengthening, functional movement pattern improvements.   Elsie Ra, PT, DPT 04/22/22 4:08 PM

## 2022-04-24 DIAGNOSIS — M47817 Spondylosis without myelopathy or radiculopathy, lumbosacral region: Secondary | ICD-10-CM | POA: Diagnosis not present

## 2022-04-26 ENCOUNTER — Ambulatory Visit (INDEPENDENT_AMBULATORY_CARE_PROVIDER_SITE_OTHER): Payer: PPO

## 2022-04-26 DIAGNOSIS — E538 Deficiency of other specified B group vitamins: Secondary | ICD-10-CM

## 2022-04-26 MED ORDER — CYANOCOBALAMIN 1000 MCG/ML IJ SOLN
1000.0000 ug | Freq: Once | INTRAMUSCULAR | Status: AC
  Administered 2022-04-26: 1000 ug via INTRAMUSCULAR

## 2022-04-26 NOTE — Progress Notes (Signed)
After obtaining consent, and per orders of Dr. Ronnald Ramp, injection of B12 given in the left deltoid  by Marrian Salvage. Patient instructed to report any adverse reaction to me immediately.

## 2022-04-27 ENCOUNTER — Encounter: Payer: PPO | Admitting: Rehabilitative and Restorative Service Providers"

## 2022-04-29 ENCOUNTER — Encounter: Payer: PPO | Admitting: Rehabilitative and Restorative Service Providers"

## 2022-04-30 ENCOUNTER — Ambulatory Visit (INDEPENDENT_AMBULATORY_CARE_PROVIDER_SITE_OTHER): Payer: PPO

## 2022-04-30 ENCOUNTER — Ambulatory Visit (INDEPENDENT_AMBULATORY_CARE_PROVIDER_SITE_OTHER): Payer: PPO | Admitting: Orthopaedic Surgery

## 2022-04-30 DIAGNOSIS — Z981 Arthrodesis status: Secondary | ICD-10-CM | POA: Diagnosis not present

## 2022-04-30 NOTE — Progress Notes (Signed)
Office Visit Note   Patient: Claudia [name]           Date of Birth: May 30, 1934           MRN: [id number] Visit Date: 04/30/2022              Requested by: Janith Lima, MD [address] [place],  [place] [address] PCP: Janith Lima, MD   Assessment & Plan: Visit Diagnoses:  1. S/P cervical spinal fusion     Plan: Improvement in myelopathy and mobility post cervical fusion decompression.  Recheck 6 months.  She is continuing her therapy and strengthening.  Follow-Up Instructions: No follow-ups on file.   Orders:  Orders Placed This Encounter  Procedures   XR Cervical Spine With Flex & Extend   No orders of the defined types were placed in this encounter.     Procedures: No procedures performed   Clinical Data: No additional findings.   Subjective: Chief Complaint  Patient presents with   Neck - Follow-up    HPI patient with three-level cervical decompression and fusion with greater than a year 9 ambulation with cord myelopathy.  She can stand now and she has been standing and cooking some she is walking in the house using her walker.  Still has tingling in her fingers she thinks is gotten somewhat better since his surgery but still has some upper extremity weakness and numbness.  She is happy with her progress with therapy.  Review of Systems updated unchanged   Objective: Vital Signs: There were no vitals taken for this visit.  Physical Exam Constitutional:      Appearance: She is well-developed.  HENT:     Head: Normocephalic.     Right Ear: External ear normal.     Left Ear: External ear normal. There is no impacted cerumen.  Eyes:     Pupils: Pupils are equal, round, and reactive to light.  Neck:     Thyroid: No thyromegaly.     Trachea: No tracheal deviation.  Cardiovascular:     Rate and Rhythm: Normal rate.  Pulmonary:     Effort: Pulmonary effort is normal.  Abdominal:     Palpations: Abdomen is soft.  Musculoskeletal:      Cervical back: No rigidity.  Skin:    General: Skin is warm and dry.  Neurological:     Mental Status: She is alert and oriented to person, place, and time.  Psychiatric:        Behavior: Behavior normal.     Ortho Exam patient has a decreased sensation in the fingers fairly symmetrical.  She can do a straight leg raise improvement strength lower extremities.  Patient still has some weakness of ankle dorsiflexion left and right.  Specialty Comments:  No specialty comments available.  Imaging: No results found.   PMFS History: Patient Active Problem List   Diagnosis Date Noted   Deficiency anemia 02/15/2022   Vitamin B12 deficiency neuropathy ([place]) 02/15/2022   Hypercalcemia 02/15/2022   S/P cervical spinal fusion 11/10/2021   Cervical stricture or stenosis 10/20/2021   Cervical spinal stenosis 10/19/2021   Meningioma ([place]) 08/24/2021   PAF (paroxysmal atrial fibrillation) ([place]) 11/03/2020   Stage 3b chronic kidney disease ([place]) 05/06/2020   Chronic left-sided low back pain without sciatica 12/25/2018   Spinal stenosis in cervical region 05/12/2016   Therapeutic opioid-induced constipation (OIC) 01/01/2016   Chronic idiopathic constipation 09/11/2013   Lumbosacral spondylosis without myelopathy 04/09/2013   Postlaminectomy  syndrome, lumbar region 04/09/2013   Anticoagulation management encounter 02/01/2012   Neuropathy, peripheral 08/04/2011   Pure hypercholesterolemia 08/04/2011   Essential hypertension, benign 08/04/2011   Hypothyroidism 08/04/2011   DJD (degenerative joint disease) of knee 08/04/2011   Gout 08/04/2011   Past Medical History:  Diagnosis Date   Abnormality of gait    Brachial neuritis or radiculitis NOS    Degeneration of lumbar or lumbosacral intervertebral disc    Dysrhythmia    Essential hypertension, benign    Gouty arthropathy    Lumbago    Obesity    Osteoarthritis    Pure hypercholesterolemia    Unspecified hypothyroidism     Family  History  Problem Relation Age of Onset   Heart disease Father    Heart attack Father    Hypertension Mother    Alzheimer's disease Mother    Diabetes Sister    Cirrhosis Brother    Diabetes Sister    Diabetes Sister    Diabetes Brother    Heart attack Brother    Cancer Neg Hx    Kidney disease Neg Hx     Past Surgical History:  Procedure Laterality Date   ABDOMINAL HYSTERECTOMY  03/13/2010   ANTERIOR CERVICAL DECOMP/DISCECTOMY FUSION N/A 10/19/2021   Procedure: C3-4, C4-5, C5-6 ANTERIOR CERVICAL DISCECTOMY FUSION, ALLOGRAFT, PLATE;  Surgeon: [name], MD;  Location: [facility];  Service: Orthopedics;  Laterality: N/A;   BACK SURGERY     LUMBAR LAMINECTOMY  03/13/2010   right knee replacement  03/13/2010   Social History   Occupational History   Not on file  Tobacco Use   Smoking status: Never   Smokeless tobacco: Never  Vaping Use   Vaping Use: Never used  Substance and Sexual Activity   Alcohol use: Yes    Comment: 1 glass wine/month   Drug use: No   Sexual activity: Never

## 2022-05-03 ENCOUNTER — Encounter: Payer: PPO | Admitting: Rehabilitative and Restorative Service Providers"

## 2022-05-03 ENCOUNTER — Telehealth: Payer: Self-pay | Admitting: Internal Medicine

## 2022-05-03 NOTE — Telephone Encounter (Signed)
Pt has been informed that PCP stated liquid B12 is ok for her to take.

## 2022-05-03 NOTE — Telephone Encounter (Signed)
Patient would like to know if she should take Liquid B12 5000 MCG by [facility] - She said that she starts getting weak after about the 2nd week after she has her B12 injection.  Please advise.

## 2022-05-05 ENCOUNTER — Other Ambulatory Visit: Payer: Self-pay | Admitting: Cardiovascular Disease

## 2022-05-05 ENCOUNTER — Encounter: Payer: PPO | Admitting: Rehabilitative and Restorative Service Providers"

## 2022-05-05 NOTE — Telephone Encounter (Signed)
Xarelto '20mg'$  refill request received. Pt is 88 years old, weight-72.1kg, Crea-1.22 on 02/15/2022, last seen by Dr. Oval Linsey on 09/07/2021, Diagnosis-Afib, CrCl- 37.68 mL/min.   10/20/2021 note states pt's dose should be in Xarelto '15mg'$  and on 10/30/2021 note BW was >50 so Xarelto '20mg'$  was sent.   Will send message to pharmacist regarding dose.

## 2022-05-06 ENCOUNTER — Ambulatory Visit: Payer: PPO | Admitting: Rehabilitative and Restorative Service Providers"

## 2022-05-06 ENCOUNTER — Encounter: Payer: Self-pay | Admitting: Rehabilitative and Restorative Service Providers"

## 2022-05-06 DIAGNOSIS — R2689 Other abnormalities of gait and mobility: Secondary | ICD-10-CM | POA: Diagnosis not present

## 2022-05-06 DIAGNOSIS — M542 Cervicalgia: Secondary | ICD-10-CM

## 2022-05-06 DIAGNOSIS — M6281 Muscle weakness (generalized): Secondary | ICD-10-CM | POA: Diagnosis not present

## 2022-05-06 NOTE — Therapy (Signed)
OUTPATIENT PHYSICAL THERAPY TREATMENT NOTE  Patient Name: Claudia [name] MRN: [id number] DOB:1933-12-31, 88 y.o., female Today's Date: 05/06/2022   END OF SESSION:   PT End of Session - 05/06/22 1558     Visit Number 20    Number of Visits 25    Date for PT Re-Evaluation 05/17/22    Authorization Type [employer] $15 copay    Progress Note Due on Visit 24   did last one at #14   PT Start Time 1554    PT Stop Time 1633    PT Time Calculation (min) 39 min    Activity Tolerance Patient tolerated treatment well    Behavior During Therapy WFL for tasks assessed/performed                       Past Medical History:  Diagnosis Date   Abnormality of gait    Brachial neuritis or radiculitis NOS    Degeneration of lumbar or lumbosacral intervertebral disc    Dysrhythmia    Essential hypertension, benign    Gouty arthropathy    Lumbago    Obesity    Osteoarthritis    Pure hypercholesterolemia    Unspecified hypothyroidism    Past Surgical History:  Procedure Laterality Date   ABDOMINAL HYSTERECTOMY  03/13/2010   ANTERIOR CERVICAL DECOMP/DISCECTOMY FUSION N/A 10/19/2021   Procedure: C3-4, C4-5, C5-6 ANTERIOR CERVICAL DISCECTOMY FUSION, ALLOGRAFT, PLATE;  Surgeon: [name], MD;  Location: [facility];  Service: Orthopedics;  Laterality: N/A;   BACK SURGERY     LUMBAR LAMINECTOMY  03/13/2010   right knee replacement  03/13/2010   Patient Active Problem List   Diagnosis Date Noted   Deficiency anemia 02/15/2022   Vitamin B12 deficiency neuropathy ([place]) 02/15/2022   Hypercalcemia 02/15/2022   S/P cervical spinal fusion 11/10/2021   Cervical stricture or stenosis 10/20/2021   Cervical spinal stenosis 10/19/2021   Meningioma ([place]) 08/24/2021   PAF (paroxysmal atrial fibrillation) ([place]) 11/03/2020   Stage 3b chronic kidney disease ([place]) 05/06/2020   Chronic left-sided low back pain without sciatica 12/25/2018   Spinal stenosis in cervical region 05/12/2016    Therapeutic opioid-induced constipation ([facility]) 01/01/2016   Chronic idiopathic constipation 09/11/2013   Lumbosacral spondylosis without myelopathy 04/09/2013   Postlaminectomy syndrome, lumbar region 04/09/2013   Anticoagulation management encounter 02/01/2012   Neuropathy, peripheral 08/04/2011   Pure hypercholesterolemia 08/04/2011   Essential hypertension, benign 08/04/2011   Hypothyroidism 08/04/2011   DJD (degenerative joint disease) of knee 08/04/2011   Gout 08/04/2011      THERAPY DIAG:  Cervicalgia  Other abnormalities of gait and mobility  Muscle weakness (generalized)  PCP: Janith Lima. MD   REFERRING PROVIDER: [name], MD   REFERRING DIAG: Z98.1 (ICD-10-CM) - S/P cervical spinal fusion    Rationale for Evaluation and Treatment Rehabilitation   ONSET DATE: Dec 2022   SUBJECTIVE:  SUBJECTIVE STATEMENT: Pt indicated having some complaints in Lt shoulder from using arm to pushup with.  Pt indicated no particular pain complaints.  Pt indicated she has been working on exercise at home.    PERTINENT HISTORY:  History of 10/19/21 C3-4 C4-5 C5-6 ACDF with soft collar PMH bracial neuritis, abnormal gait, Degeneration of lumbar, HTN, gouty arthropathy, OA, lumbar laminectomy 2011, Rt TKA   PAIN:  No pain upon arrival.    PRECAUTIONS: None   WEIGHT BEARING RESTRICTIONS No   FALLS:  Has patient fallen in last 6 months? Maybe 1 fall in last 6 months.  Several falls within last year.    LIVING ENVIRONMENT: Lives with:lives alone Lives in: House/apartment Stairs: 3 steps to enter deck, rail on Lt going up.  No stairs in house.  Has following equipment at home: FWW, cane   OCCUPATION: retired    PLOF: Independent  , cooking/cleaning, shopping/grocery store    [place] stronger, walking independently.     OBJECTIVE:    PATIENT SURVEYS:  01/25/2022 No foto - incorrect set up    COGNITION: 01/25/2022 Overall cognitive status: Within functional limits for tasks assessed     SENSATION: 01/25/2022 not tested today   POSTURE:  01/25/2022 rounded shoulders, forward head, increased thoracic kyphosis, and flexed trunk    PALPATION: 01/25/2022 no specific tenderness noted in screening for balance.       MMT:            01/25/2022:  Seated resisted static testing hip abduction Rt: strong/painless. Lt moderate strength, painless (held MMT due to difficulty in bed mobility positioning MMT Right 01/25/2022 Left 01/25/2022 Left 02/22/2022 Left 03/11/2022 Left 03/16/22 Left 04/01/2022 Left 04/05/22  Shoulder flexion           Shoulder extension           Shoulder abduction           Shoulder adduction           Shoulder extension           Shoulder internal rotation           Shoulder external rotation                                   Hip flexion 5/5 4/5 4/5 4/5 4/5 4+/5 4+  Hip extension           Hip abduction           Knee flexion 5/5 5/5       Knee extension  5/5 5/5    4+/5 4+  Ankle DF 5/5 4/5  4/5 4+/5     (Blank rows = not tested)   CERVICAL SPECIAL TESTS:  01/25/2022 none performed   FUNCTIONAL TESTS:  05/06/2022: TUG c FWW:  37 seconds at end of session  04/05/22 TUG 38.5s seconds with FWW 03/22/22: TUG 44 seconds C FWW (done at very end of session) 03/08/2022:   TUG:  41.76 seconds c FWW 01/25/2022        TUG:   71 seconds c FWW   03/22/2022  Standardized Balance Assessment  Standardized Balance Assessment Berg Balance Test  Berg Balance Test  Sit to Stand 3  Standing Unsupported 3  Sitting with Back Unsupported but Feet Supported on Floor or Stool 4  Stand to Sit 3  Transfers 3  Standing Unsupported with Eyes Closed 3  Standing Unsupported with Feet Together 3  From Standing, Reach Forward with Outstretched Arm  3  From Standing Position, Pick up Object from Floor 3  From Standing Position, Turn to Look Behind Over each Shoulder 2  Turn 360 Degrees 1  Standing Unsupported, Alternately Place Feet on Step/Stool 0  Standing Unsupported, One Foot in Front 1  Standing on One Leg 0  Total Score 32        [facility] PT Assessment - 01/25/22 0001                Standardized Balance Assessment    Standardized Balance Assessment Berg Balance Test          Berg Balance Test    Sit to Stand Able to stand using hands after several tries     Standing Unsupported Able to stand 2 minutes with supervision     Sitting with Back Unsupported but Feet Supported on Floor or Stool Able to sit safely and securely 2 minutes     Stand to Sit Uses backs of legs against chair to control descent     Transfers Able to transfer safely, definite need of hands     Standing Unsupported with Eyes Closed Able to stand 3 seconds     Standing Unsupported with Feet Together Needs help to attain position but able to stand for 30 seconds with feet together     From Standing, Reach Forward with Outstretched Arm Can reach forward >5 cm safely (2")     From Standing Position, Pick up Object from Floor Unable to pick up and needs supervision     From Standing Position, Turn to Look Behind Over each Shoulder Needs assist to keep from losing balance and falling     Turn 360 Degrees Needs assistance while turning     Standing Unsupported, Alternately Place Feet on Step/Stool Needs assistance to keep from falling or unable to try     Standing Unsupported, One Foot in Front Needs help to step but can hold 15 seconds     Standing on One Leg Unable to try or needs assist to prevent fall     Total Score 21                GAIT ASSESSMENT: 03/18/2022:  FWW into clinic c mod independent, decreased gait speed.  SPC use in Rt UE c CGA 33 ft today c cues for sequencing.    TODAY'S TREATMENT:  05/06/2022: Nustep Lvl 6 4 mins, lvl 5 8 mins  UE/LE Seated LAQ 2 lbs x 15 bilateral Seated SLR x 10 bilateral   HEP review c handout and cues for reminder of techniques  TherActivity (to improve walking, curb/step navigation) Leg press Double leg 56 lbs 2 x 15 slow focus to avoid hyperextension Lt knee, single leg Lt 31 lbs 2 x 15, single leg Rt 37 lbs 2 x 15 Step up 4 inch fwd x 10 bilateral c bilateral hand assist and CGA TUG x 1    04/19/2022 Therex: Nustep lvl 6 4 mins, Lvl 5 6 mins (10 mins total) Recumbent bike L3 for 3 min and L2 for 3 min (6 min total) Leg press Double leg 56 lbs 3x 10, single leg Lt 31 lbs 2 x 15, single leg Rt 37 lbs 2 x 15 Seated SLR X 10 bilat Seated marches X 10 bilat Seated LAQ X 15 bilat Sit to stand to sit 22.5 inches without UE assist x 10  Neuro Re-ed  Standing inside RW with feet apart and eyes closed 20 seconds X 4 min A required to prevent loss of balance Fwd BIG inspired stepping c target on ground for distance x 10 bilateral c SBA in min A and single hand on walker (rt hand)  04/15/2022 Therex: Nustep lvl 6 4 mins, Lvl 5 6 mins (10 mins total) Recumbent bike L2 X 4 min Leg press Double leg 56 lbs 3x 10, single leg Lt 31 lbs x 15, single leg Rt 37 lbs x 15 Seated SLR X 10 bilat Seated marches X 10 bilat Seated LAQ X 10 bilat Sit to stand to sit 22.5 inches without UE assist x 10  Neuro Re-ed Standing inside RW with feet apart and eyes closed 20 seconds X 4 min A required to prevent loss of balance Fwd BIG inspired stepping c target on ground for distance x 10 bilateral c SBA in min A and single hand on walker (rt hand)       PATIENT EDUCATION:  05/06/2022 Education details: HEP update Person educated: Patient Education method: Consulting civil engineer, Demonstration, Verbal cues, and Handouts Education comprehension: verbalized understanding, returned demonstration, and verbal cues required     HOME EXERCISE PROGRAM: Access Code: [email or web address] URL:  [email or web address] Date: 05/06/2022 Prepared by: Scot Jun  Exercises - Seated March  - 1-2 x daily - 7 x weekly - 1-2 sets - 10 reps - Sit to Stand  - 2-3 x daily - 7 x weekly - 1 sets - 5-10 reps - Seated Long Arc Quad  - 2-3 x daily - 7 x weekly - 1-2 sets - 10 reps - 2 hold - Standing Scapular Retraction  - 5 x daily - 7 x weekly - 1 sets - 5 reps - 5 second hold - Heel Toe Raises with Counter Support  - 1-2 x daily - 7 x weekly - 1-2 sets - 10 reps - Standing Tandem Balance with Counter Support  - 1-2 x daily - 7 x weekly - 1 sets - 10 reps - Seated Straight Leg Heel Taps  - 1-2 x daily - 7 x weekly - 1-2 sets - 5-10 reps - Seated Quad Set (Mirrored)  - 1-2 x daily - 7 x weekly - 1 sets - 10 reps - 5 hold - Small Range Straight Leg Raise  - 1-2 x daily - 7 x weekly - 1-2 sets - 10-15 reps   ASSESSMENT:   CLINICAL IMPRESSION: Lt and Rt leg strengthening to continue to improve functional mobility and control (Lt more impairment than Rt).  Pt does continue to show some improvement steadily in ability to perform movement in clinic at better quality than previous.  Focus at this time on improving HEP plan to allow continued strengthening at home.  Extended time in POC required c higher visit number due to original presentation level.    OBJECTIVE IMPAIRMENTS Abnormal gait, decreased activity tolerance, decreased balance, decreased endurance, decreased mobility, difficulty walking, decreased strength, impaired perceived functional ability, impaired flexibility, improper body mechanics, postural dysfunction, and pain.    ACTIVITY LIMITATIONS carrying, lifting, bending, standing, squatting, stairs, transfers, bed mobility, reach over head, and locomotion level   PARTICIPATION LIMITATIONS: meal prep, cleaning, interpersonal relationship, driving, shopping, and community activity   PERSONAL FACTORS   History of 10/19/21 C3-4 C4-5 C5-6 ACDF with soft collar PMH bracial  neuritis, abnormal gait, Degeneration of lumbar, HTN, gouty arthropathy, OA, lumbar laminectomy 2011, Rt TKA  are also affecting patient's functional outcome.  REHAB POTENTIAL: Good   CLINICAL DECISION MAKING: Stable/uncomplicated   EVALUATION COMPLEXITY: Low     GOALS: Goals reviewed with patient? Yes   Short term PT Goals (target date for Short term goals are 3 weeks 02/15/2022) Patient will demonstrate independent use of home exercise program to maintain progress from in clinic treatments. Goal status: MET   Long term PT goals (target dates for all long term goals are 10 weeks  05/17/2022 )   1. Patient will demonstrate/report pain at worst less than or equal to 2/10 to facilitate minimal limitation in daily activity secondary to pain symptoms. Goal status: MET 04/05/22   2. Patient will demonstrate independent use of home exercise program to facilitate ability to maintain/progress functional gains from skilled physical therapy services. Goal status: on going assessed 04/05/2022   3. Patient will demonstrate bilateral hip MMT 5/5 throughout to facilitate improved transfers, ambulation towards independence.  Goal status:on going assessed 04/05/22   4.  Patient will demonstrate BERG testing > or = 45 to indicate reduced fall risk.  Goal status: on going assessed 04/05/2022   5.  Patient will demonstrate TUG c LRAD < or = 20 seconds to indicate reduced fall risk/ improved community ambulation.    Goal status: on going assessed 10/92023   6.  Patient will demonstrate ability to ambulation c LRAD (SPC, independent) community distances > 300 ft.   Goal status: on going assessed 04/05/2022       PLAN: PT FREQUENCY: 1-2x/week   PT DURATION: 6 more  weeks from recert   PLANNED INTERVENTIONS: Therapeutic exercises, Therapeutic activity, Neuro Muscular re-education, Balance training, Gait training, Patient/Family education, Joint mobilization, Stair training, DME instructions, Dry  Needling, Electrical stimulation, Cryotherapy, Moist heat, Taping, Ultrasound, Ionotophoresis 41m/ml Dexamethasone, and Manual therapy.  All included unless contraindicated   PLAN FOR NEXT SESSION: Progressive WB strengthening, specifically Lt leg.  Balance improvements.     MScot Jun PT, DPT, OCS, ATC 05/06/22  4:34 PM

## 2022-05-10 ENCOUNTER — Encounter: Payer: PPO | Admitting: Rehabilitative and Restorative Service Providers"

## 2022-05-10 ENCOUNTER — Ambulatory Visit (INDEPENDENT_AMBULATORY_CARE_PROVIDER_SITE_OTHER): Payer: PPO | Admitting: Physical Therapy

## 2022-05-10 ENCOUNTER — Encounter: Payer: Self-pay | Admitting: Physical Therapy

## 2022-05-10 DIAGNOSIS — M542 Cervicalgia: Secondary | ICD-10-CM

## 2022-05-10 DIAGNOSIS — R2689 Other abnormalities of gait and mobility: Secondary | ICD-10-CM

## 2022-05-10 DIAGNOSIS — M6281 Muscle weakness (generalized): Secondary | ICD-10-CM

## 2022-05-10 NOTE — Therapy (Signed)
OUTPATIENT PHYSICAL THERAPY TREATMENT NOTE  Patient Name: Claudia [name] MRN: [id number] DOB:[birth year], 88 y.o., female Today's Date: 05/10/2022   END OF SESSION:   PT End of Session - 05/10/22 1307     Visit Number 21    Number of Visits 25    Date for PT Re-Evaluation 05/17/22    Authorization Type [employer] $15 copay    Progress Note Due on Visit 24   did last one at #14   PT Start Time 1300    PT Stop Time 1340    PT Time Calculation (min) 40 min    Activity Tolerance Patient tolerated treatment well    Behavior During Therapy WFL for tasks assessed/performed             Past Medical History:  Diagnosis Date   Abnormality of gait    Brachial neuritis or radiculitis NOS    Degeneration of lumbar or lumbosacral intervertebral disc    Dysrhythmia    Essential hypertension, benign    Gouty arthropathy    Lumbago    Obesity    Osteoarthritis    Pure hypercholesterolemia    Unspecified hypothyroidism    Past Surgical History:  Procedure Laterality Date   ABDOMINAL HYSTERECTOMY  03/13/2010   ANTERIOR CERVICAL DECOMP/DISCECTOMY FUSION N/A 10/19/2021   Procedure: C3-4, C4-5, C5-6 ANTERIOR CERVICAL DISCECTOMY FUSION, ALLOGRAFT, PLATE;  Surgeon: [name], MD;  Location: [facility];  Service: Orthopedics;  Laterality: N/A;   BACK SURGERY     LUMBAR LAMINECTOMY  03/13/2010   right knee replacement  03/13/2010   Patient Active Problem List   Diagnosis Date Noted   Deficiency anemia 02/15/2022   Vitamin B12 deficiency neuropathy ([place]) 02/15/2022   Hypercalcemia 02/15/2022   S/P cervical spinal fusion 11/10/2021   Cervical stricture or stenosis 10/20/2021   Cervical spinal stenosis 10/19/2021   Meningioma ([place]) 08/24/2021   PAF (paroxysmal atrial fibrillation) ([place]) 11/03/2020   Stage 3b chronic kidney disease ([place]) 05/06/2020   Chronic left-sided low back pain without sciatica 12/25/2018   Spinal stenosis in cervical region 05/12/2016   Therapeutic  opioid-induced constipation ([facility]) 01/01/2016   Chronic idiopathic constipation 09/11/2013   Lumbosacral spondylosis without myelopathy 04/09/2013   Postlaminectomy syndrome, lumbar region 04/09/2013   Anticoagulation management encounter 02/01/2012   Neuropathy, peripheral 08/04/2011   Pure hypercholesterolemia 08/04/2011   Essential hypertension, benign 08/04/2011   Hypothyroidism 08/04/2011   DJD (degenerative joint disease) of knee 08/04/2011   Gout 08/04/2011      THERAPY DIAG:  Cervicalgia  Other abnormalities of gait and mobility  Muscle weakness (generalized)  PCP: Janith Lima. MD   REFERRING PROVIDER: [name], MD   REFERRING DIAG: Z98.1 (ICD-10-CM) - S/P cervical spinal fusion    Rationale for Evaluation and Treatment Rehabilitation   ONSET DATE: Dec 2022   SUBJECTIVE:  SUBJECTIVE STATEMENT: She denies pain upon arrival.    PERTINENT HISTORY:  History of 10/19/21 C3-4 C4-5 C5-6 ACDF with soft collar PMH bracial neuritis, abnormal gait, Degeneration of lumbar, HTN, gouty arthropathy, OA, lumbar laminectomy 2011, Rt TKA   PAIN:  No pain upon arrival.    PRECAUTIONS: None   WEIGHT BEARING RESTRICTIONS No   FALLS:  Has patient fallen in last 6 months? Maybe 1 fall in last 6 months.  Several falls within last year.    LIVING ENVIRONMENT: Lives with:lives alone Lives in: House/apartment Stairs: 3 steps to enter deck, rail on Lt going up.  No stairs in house.  Has following equipment at home: FWW, cane   OCCUPATION: retired    PLOF: Independent  , cooking/cleaning, shopping/grocery store   [place] stronger, walking independently.     OBJECTIVE:    PATIENT SURVEYS:  01/25/2022 No foto - incorrect set up    COGNITION: 01/25/2022  Overall cognitive status: Within functional limits for tasks assessed     SENSATION: 01/25/2022 not tested today   POSTURE:  01/25/2022 rounded shoulders, forward head, increased thoracic kyphosis, and flexed trunk    PALPATION: 01/25/2022 no specific tenderness noted in screening for balance.       MMT:            01/25/2022:  Seated resisted static testing hip abduction Rt: strong/painless. Lt moderate strength, painless (held MMT due to difficulty in bed mobility positioning MMT Right 01/25/2022 Left 01/25/2022 Left 02/22/2022 Left 03/11/2022 Left 03/16/22 Left 04/01/2022 Left 04/05/22  Shoulder flexion           Shoulder extension           Shoulder abduction           Shoulder adduction           Shoulder extension           Shoulder internal rotation           Shoulder external rotation                                   Hip flexion 5/5 4/5 4/5 4/5 4/5 4+/5 4+  Hip extension           Hip abduction           Knee flexion 5/5 5/5       Knee extension  5/5 5/5    4+/5 4+  Ankle DF 5/5 4/5  4/5 4+/5     (Blank rows = not tested)   CERVICAL SPECIAL TESTS:  01/25/2022 none performed   FUNCTIONAL TESTS:  05/06/2022: TUG c FWW:  37 seconds at end of session  04/05/22 TUG 38.5s seconds with FWW 03/22/22: TUG 44 seconds C FWW (done at very end of session) 03/08/2022:   TUG:  41.76 seconds c FWW 01/25/2022        TUG:   71 seconds c FWW   03/22/2022  Standardized Balance Assessment  Standardized Balance Assessment Berg Balance Test  Berg Balance Test  Sit to Stand 3  Standing Unsupported 3  Sitting with Back Unsupported but Feet Supported on Floor or Stool 4  Stand to Sit 3  Transfers 3  Standing Unsupported with Eyes Closed 3  Standing Unsupported with Feet Together 3  From Standing, Reach Forward with Outstretched Arm 3  From Standing Position, Pick up Object from Floor 3  From Standing Position, Turn to Look Behind Over each Shoulder 2  Turn 360 Degrees 1  Standing  Unsupported, Alternately Place Feet on Step/Stool 0  Standing Unsupported, One Foot in Front 1  Standing on One Leg 0  Total Score 32        [facility] PT Assessment - 01/25/22 0001                Standardized Balance Assessment    Standardized Balance Assessment Berg Balance Test          Berg Balance Test    Sit to Stand Able to stand using hands after several tries     Standing Unsupported Able to stand 2 minutes with supervision     Sitting with Back Unsupported but Feet Supported on Floor or Stool Able to sit safely and securely 2 minutes     Stand to Sit Uses backs of legs against chair to control descent     Transfers Able to transfer safely, definite need of hands     Standing Unsupported with Eyes Closed Able to stand 3 seconds     Standing Unsupported with Feet Together Needs help to attain position but able to stand for 30 seconds with feet together     From Standing, Reach Forward with Outstretched Arm Can reach forward >5 cm safely (2")     From Standing Position, Pick up Object from Floor Unable to pick up and needs supervision     From Standing Position, Turn to Look Behind Over each Shoulder Needs assist to keep from losing balance and falling     Turn 360 Degrees Needs assistance while turning     Standing Unsupported, Alternately Place Feet on Step/Stool Needs assistance to keep from falling or unable to try     Standing Unsupported, One Foot in Front Needs help to step but can hold 15 seconds     Standing on One Leg Unable to try or needs assist to prevent fall     Total Score 21                GAIT ASSESSMENT: 03/18/2022:  FWW into clinic c mod independent, decreased gait speed.  SPC use in Rt UE c CGA 33 ft today c cues for sequencing.    TODAY'S TREATMENT:  05/10/2022 Therex: Step up 4 inch fwd x 10 bilateral c bilateral hand assis Recumbent bike L2 for 3 min (8 min total) Leg press Double leg 56 lbs 3x 10, single leg Lt 31 lbs 2 x 15, single leg Rt 37 lbs 2  x 15 Seated SLR X 10 bilat Seated marches X 10 bilat Seated LAQ 2# X 15 bilat Sit to stand to sit 22.5 inches without UE assist x 10   05/06/2022: Nustep Lvl 6 4 mins, lvl 5 8 mins UE/LE Seated LAQ 2 lbs x 15 bilateral Seated SLR x 10 bilateral   HEP review c handout and cues for reminder of techniques  TherActivity (to improve walking, curb/step navigation) Leg press Double leg 56 lbs 2 x 15 slow focus to avoid hyperextension Lt knee, single leg Lt 31 lbs 2 x 15, single leg Rt 37 lbs 2 x 15 Step up 4 inch fwd x 10 bilateral c bilateral hand assist and CGA TUG x 1    PATIENT EDUCATION:  05/06/2022 Education details: HEP update Person educated: Patient Education method: Consulting civil engineer, Demonstration, Verbal cues, and Handouts Education comprehension: verbalized understanding, returned demonstration, and verbal cues required  HOME EXERCISE PROGRAM: Access Code: [email or web address] URL: [email or web address] Date: 05/06/2022 Prepared by: Scot Jun  Exercises - Seated March  - 1-2 x daily - 7 x weekly - 1-2 sets - 10 reps - Sit to Stand  - 2-3 x daily - 7 x weekly - 1 sets - 5-10 reps - Seated Long Arc Quad  - 2-3 x daily - 7 x weekly - 1-2 sets - 10 reps - 2 hold - Standing Scapular Retraction  - 5 x daily - 7 x weekly - 1 sets - 5 reps - 5 second hold - Heel Toe Raises with Counter Support  - 1-2 x daily - 7 x weekly - 1-2 sets - 10 reps - Standing Tandem Balance with Counter Support  - 1-2 x daily - 7 x weekly - 1 sets - 10 reps - Seated Straight Leg Heel Taps  - 1-2 x daily - 7 x weekly - 1-2 sets - 5-10 reps - Seated Quad Set (Mirrored)  - 1-2 x daily - 7 x weekly - 1 sets - 10 reps - 5 hold - Small Range Straight Leg Raise  - 1-2 x daily - 7 x weekly - 1-2 sets - 10-15 reps   ASSESSMENT:   CLINICAL IMPRESSION: We continued to work to improve her leg strength, and functional standing endurance for ADL's. Progress has been slow but we expected this based on her  PMH and her functional presentation initially. She will continue to benefit from PT.    OBJECTIVE IMPAIRMENTS Abnormal gait, decreased activity tolerance, decreased balance, decreased endurance, decreased mobility, difficulty walking, decreased strength, impaired perceived functional ability, impaired flexibility, improper body mechanics, postural dysfunction, and pain.    ACTIVITY LIMITATIONS carrying, lifting, bending, standing, squatting, stairs, transfers, bed mobility, reach over head, and locomotion level   PARTICIPATION LIMITATIONS: meal prep, cleaning, interpersonal relationship, driving, shopping, and community activity   PERSONAL FACTORS   History of 10/19/21 C3-4 C4-5 C5-6 ACDF with soft collar PMH bracial neuritis, abnormal gait, Degeneration of lumbar, HTN, gouty arthropathy, OA, lumbar laminectomy 2011, Rt TKA  are also affecting patient's functional outcome.    REHAB POTENTIAL: Good   CLINICAL DECISION MAKING: Stable/uncomplicated   EVALUATION COMPLEXITY: Low     GOALS: Goals reviewed with patient? Yes   Short term PT Goals (target date for Short term goals are 3 weeks 02/15/2022) Patient will demonstrate independent use of home exercise program to maintain progress from in clinic treatments. Goal status: MET   Long term PT goals (target dates for all long term goals are 10 weeks  05/17/2022 )   1. Patient will demonstrate/report pain at worst less than or equal to 2/10 to facilitate minimal limitation in daily activity secondary to pain symptoms. Goal status: MET 04/05/22   2. Patient will demonstrate independent use of home exercise program to facilitate ability to maintain/progress functional gains from skilled physical therapy services. Goal status: on going assessed 04/05/2022   3. Patient will demonstrate bilateral hip MMT 5/5 throughout to facilitate improved transfers, ambulation towards independence.  Goal status:on going assessed 04/05/22   4.  Patient will  demonstrate BERG testing > or = 45 to indicate reduced fall risk.  Goal status: on going assessed 04/05/2022   5.  Patient will demonstrate TUG c LRAD < or = 20 seconds to indicate reduced fall risk/ improved community ambulation.    Goal status: on going assessed 10/92023   6.  Patient will demonstrate ability to ambulation c LRAD (SPC, independent) community  distances > 300 ft.   Goal status: on going assessed 04/05/2022       PLAN: PT FREQUENCY: 1-2x/week   PT DURATION: 6 more  weeks from recert   PLANNED INTERVENTIONS: Therapeutic exercises, Therapeutic activity, Neuro Muscular re-education, Balance training, Gait training, Patient/Family education, Joint mobilization, Stair training, DME instructions, Dry Needling, Electrical stimulation, Cryotherapy, Moist heat, Taping, Ultrasound, Ionotophoresis 79m/ml Dexamethasone, and Manual therapy.  All included unless contraindicated   PLAN FOR NEXT SESSION: Progressive WB strengthening, specifically Lt leg.  Balance improvements.     BElsie Ra PT, DPT 05/10/22 1:31 PM

## 2022-05-12 ENCOUNTER — Encounter: Payer: PPO | Admitting: Rehabilitative and Restorative Service Providers"

## 2022-05-13 ENCOUNTER — Encounter: Payer: Self-pay | Admitting: Physical Therapy

## 2022-05-13 ENCOUNTER — Ambulatory Visit: Payer: PPO | Admitting: Physical Therapy

## 2022-05-13 DIAGNOSIS — R293 Abnormal posture: Secondary | ICD-10-CM

## 2022-05-13 DIAGNOSIS — R2689 Other abnormalities of gait and mobility: Secondary | ICD-10-CM

## 2022-05-13 DIAGNOSIS — M6281 Muscle weakness (generalized): Secondary | ICD-10-CM

## 2022-05-13 DIAGNOSIS — M545 Low back pain, unspecified: Secondary | ICD-10-CM | POA: Diagnosis not present

## 2022-05-13 DIAGNOSIS — M542 Cervicalgia: Secondary | ICD-10-CM | POA: Diagnosis not present

## 2022-05-13 DIAGNOSIS — G8929 Other chronic pain: Secondary | ICD-10-CM | POA: Diagnosis not present

## 2022-05-13 NOTE — Therapy (Signed)
OUTPATIENT PHYSICAL THERAPY TREATMENT NOTE  Patient Name: Claudia [name] MRN: [id number] DOB:[birth year], 88 y.o., female Today's Date: 05/13/2022   END OF SESSION:   PT End of Session - 05/13/22 1607     Visit Number 22    Number of Visits 25    Date for PT Re-Evaluation 05/17/22    Authorization Type [employer] $15 copay    Progress Note Due on Visit 24   did last one at #14   PT Start Time 1601    PT Stop Time 1641    PT Time Calculation (min) 40 min    Activity Tolerance Patient tolerated treatment well    Behavior During Therapy WFL for tasks assessed/performed             Past Medical History:  Diagnosis Date   Abnormality of gait    Brachial neuritis or radiculitis NOS    Degeneration of lumbar or lumbosacral intervertebral disc    Dysrhythmia    Essential hypertension, benign    Gouty arthropathy    Lumbago    Obesity    Osteoarthritis    Pure hypercholesterolemia    Unspecified hypothyroidism    Past Surgical History:  Procedure Laterality Date   ABDOMINAL HYSTERECTOMY  03/13/2010   ANTERIOR CERVICAL DECOMP/DISCECTOMY FUSION N/A 10/19/2021   Procedure: C3-4, C4-5, C5-6 ANTERIOR CERVICAL DISCECTOMY FUSION, ALLOGRAFT, PLATE;  Surgeon: [name], MD;  Location: [facility];  Service: Orthopedics;  Laterality: N/A;   BACK SURGERY     LUMBAR LAMINECTOMY  03/13/2010   right knee replacement  03/13/2010   Patient Active Problem List   Diagnosis Date Noted   Deficiency anemia 02/15/2022   Vitamin B12 deficiency neuropathy ([place]) 02/15/2022   Hypercalcemia 02/15/2022   S/P cervical spinal fusion 11/10/2021   Cervical stricture or stenosis 10/20/2021   Cervical spinal stenosis 10/19/2021   Meningioma ([place]) 08/24/2021   PAF (paroxysmal atrial fibrillation) ([place]) 11/03/2020   Stage 3b chronic kidney disease ([place]) 05/06/2020   Chronic left-sided low back pain without sciatica 12/25/2018   Spinal stenosis in cervical region 05/12/2016   Therapeutic  opioid-induced constipation ([facility]) 01/01/2016   Chronic idiopathic constipation 09/11/2013   Lumbosacral spondylosis without myelopathy 04/09/2013   Postlaminectomy syndrome, lumbar region 04/09/2013   Anticoagulation management encounter 02/01/2012   Neuropathy, peripheral 08/04/2011   Pure hypercholesterolemia 08/04/2011   Essential hypertension, benign 08/04/2011   Hypothyroidism 08/04/2011   DJD (degenerative joint disease) of knee 08/04/2011   Gout 08/04/2011      THERAPY DIAG:  Cervicalgia  Other abnormalities of gait and mobility  Muscle weakness (generalized)  Abnormal posture  Chronic bilateral low back pain without sciatica  PCP: Janith Lima. MD   REFERRING PROVIDER: [name], MD   REFERRING DIAG: Z98.1 (ICD-10-CM) - S/P cervical spinal fusion    Rationale for Evaluation and Treatment Rehabilitation   ONSET DATE: Dec 2022   SUBJECTIVE:  SUBJECTIVE STATEMENT: She denies pain upon arrival. She likes the bike and feels it is helping her legs get stronger. She feels like her balance is improving some.   PERTINENT HISTORY:  History of 10/19/21 C3-4 C4-5 C5-6 ACDF with soft collar PMH bracial neuritis, abnormal gait, Degeneration of lumbar, HTN, gouty arthropathy, OA, lumbar laminectomy 2011, Rt TKA   PAIN:  No pain upon arrival.    PRECAUTIONS: None   WEIGHT BEARING RESTRICTIONS No   FALLS:  Has patient fallen in last 6 months? Maybe 1 fall in last 6 months.  Several falls within last year.    LIVING ENVIRONMENT: Lives with:lives alone Lives in: House/apartment Stairs: 3 steps to enter deck, rail on Lt going up.  No stairs in house.  Has following equipment at home: FWW, cane   OCCUPATION: retired    PLOF: Independent  , cooking/cleaning,  shopping/grocery store   [place] stronger, walking independently.     OBJECTIVE:    PATIENT SURVEYS:  01/25/2022 No foto - incorrect set up    COGNITION: 01/25/2022 Overall cognitive status: Within functional limits for tasks assessed     SENSATION: 01/25/2022 not tested today   POSTURE:  01/25/2022 rounded shoulders, forward head, increased thoracic kyphosis, and flexed trunk    PALPATION: 01/25/2022 no specific tenderness noted in screening for balance.       MMT:            01/25/2022:  Seated resisted static testing hip abduction Rt: strong/painless. Lt moderate strength, painless (held MMT due to difficulty in bed mobility positioning MMT Right 01/25/2022 Left 01/25/2022 Left 02/22/2022 Left 03/11/2022 Left 03/16/22 Left 04/01/2022 Left 04/05/22  Shoulder flexion           Shoulder extension           Shoulder abduction           Shoulder adduction           Shoulder extension           Shoulder internal rotation           Shoulder external rotation                                   Hip flexion 5/5 4/5 4/5 4/5 4/5 4+/5 4+  Hip extension           Hip abduction           Knee flexion 5/5 5/5       Knee extension  5/5 5/5    4+/5 4+  Ankle DF 5/5 4/5  4/5 4+/5     (Blank rows = not tested)   CERVICAL SPECIAL TESTS:  01/25/2022 none performed   FUNCTIONAL TESTS:  05/06/2022: TUG c FWW:  37 seconds at end of session  04/05/22 TUG 38.5s seconds with FWW 03/22/22: TUG 44 seconds C FWW (done at very end of session) 03/08/2022:   TUG:  41.76 seconds c FWW 01/25/2022        TUG:   71 seconds c FWW   03/22/2022  Standardized Balance Assessment  Standardized Balance Assessment Berg Balance Test  Berg Balance Test  Sit to Stand 3  Standing Unsupported 3  Sitting with Back Unsupported but Feet Supported on Floor or Stool 4  Stand to Sit 3  Transfers 3  Standing Unsupported with Eyes Closed 3  Standing Unsupported with Feet Together 3  From Standing, Reach  Forward with Outstretched Arm 3  From Standing Position, Pick up Object from Floor 3  From Standing Position, Turn to Look Behind Over each Shoulder 2  Turn 360 Degrees 1  Standing Unsupported, Alternately Place Feet on Step/Stool 0  Standing Unsupported, One Foot in Front 1  Standing on One Leg 0  Total Score 32        [facility] PT Assessment - 01/25/22 0001                Standardized Balance Assessment    Standardized Balance Assessment Berg Balance Test          Berg Balance Test    Sit to Stand Able to stand using hands after several tries     Standing Unsupported Able to stand 2 minutes with supervision     Sitting with Back Unsupported but Feet Supported on Floor or Stool Able to sit safely and securely 2 minutes     Stand to Sit Uses backs of legs against chair to control descent     Transfers Able to transfer safely, definite need of hands     Standing Unsupported with Eyes Closed Able to stand 3 seconds     Standing Unsupported with Feet Together Needs help to attain position but able to stand for 30 seconds with feet together     From Standing, Reach Forward with Outstretched Arm Can reach forward >5 cm safely (2")     From Standing Position, Pick up Object from Floor Unable to pick up and needs supervision     From Standing Position, Turn to Look Behind Over each Shoulder Needs assist to keep from losing balance and falling     Turn 360 Degrees Needs assistance while turning     Standing Unsupported, Alternately Place Feet on Step/Stool Needs assistance to keep from falling or unable to try     Standing Unsupported, One Foot in Front Needs help to step but can hold 15 seconds     Standing on One Leg Unable to try or needs assist to prevent fall     Total Score 21                GAIT ASSESSMENT: 03/18/2022:  FWW into clinic c mod independent, decreased gait speed.  SPC use in Rt UE c CGA 33 ft today c cues for sequencing.    TODAY'S TREATMENT:   05/13/2022 Therex: Step up 4 inch fwd x 10 bilateral c bilateral hand assis Recumbent bike L2-1 (11 min total) Leg press Double leg 62 lbs 3x 10, single leg Lt 31 lbs 2 x 15, single leg Rt 37 lbs 2 x 15 Balance on foam pad with feet apart 20 sec X 3 Tandem balance modified 20 sec X 2 bilat Seated SLR X 10 bilat Seated LAQ 2.5# X 15 bilat Sit to stand with UE support from chair X 5    PATIENT EDUCATION:  05/06/2022 Education details: HEP update Person educated: Patient Education method: Consulting civil engineer, Media planner, Verbal cues, and Handouts Education comprehension: verbalized understanding, returned demonstration, and verbal cues required     HOME EXERCISE PROGRAM: Access Code: [email or web address] URL: [email or web address] Date: 05/06/2022 Prepared by: Scot Jun  Exercises - Seated March  - 1-2 x daily - 7 x weekly - 1-2 sets - 10 reps - Sit to Stand  - 2-3 x daily - 7 x weekly - 1 sets - 5-10 reps - Seated Long Arc Quad  - 2-3 x daily -  7 x weekly - 1-2 sets - 10 reps - 2 hold - Standing Scapular Retraction  - 5 x daily - 7 x weekly - 1 sets - 5 reps - 5 second hold - Heel Toe Raises with Counter Support  - 1-2 x daily - 7 x weekly - 1-2 sets - 10 reps - Standing Tandem Balance with Counter Support  - 1-2 x daily - 7 x weekly - 1 sets - 10 reps - Seated Straight Leg Heel Taps  - 1-2 x daily - 7 x weekly - 1-2 sets - 5-10 reps - Seated Quad Set (Mirrored)  - 1-2 x daily - 7 x weekly - 1 sets - 10 reps - 5 hold - Small Range Straight Leg Raise  - 1-2 x daily - 7 x weekly - 1-2 sets - 10-15 reps   ASSESSMENT:   CLINICAL IMPRESSION: She had good tolerance to strength and balance progressions but is still limited by weakness and requires UE support for gait that PT will continue to work to maximize in her remaining sessions.   OBJECTIVE IMPAIRMENTS Abnormal gait, decreased activity tolerance, decreased balance, decreased endurance, decreased mobility, difficulty walking,  decreased strength, impaired perceived functional ability, impaired flexibility, improper body mechanics, postural dysfunction, and pain.    ACTIVITY LIMITATIONS carrying, lifting, bending, standing, squatting, stairs, transfers, bed mobility, reach over head, and locomotion level   PARTICIPATION LIMITATIONS: meal prep, cleaning, interpersonal relationship, driving, shopping, and community activity   PERSONAL FACTORS   History of 10/19/21 C3-4 C4-5 C5-6 ACDF with soft collar PMH bracial neuritis, abnormal gait, Degeneration of lumbar, HTN, gouty arthropathy, OA, lumbar laminectomy 2011, Rt TKA  are also affecting patient's functional outcome.    REHAB POTENTIAL: Good   CLINICAL DECISION MAKING: Stable/uncomplicated   EVALUATION COMPLEXITY: Low     GOALS: Goals reviewed with patient? Yes   Short term PT Goals (target date for Short term goals are 3 weeks 02/15/2022) Patient will demonstrate independent use of home exercise program to maintain progress from in clinic treatments. Goal status: MET   Long term PT goals (target dates for all long term goals are 10 weeks  05/17/2022 )   1. Patient will demonstrate/report pain at worst less than or equal to 2/10 to facilitate minimal limitation in daily activity secondary to pain symptoms. Goal status: MET 04/05/22   2. Patient will demonstrate independent use of home exercise program to facilitate ability to maintain/progress functional gains from skilled physical therapy services. Goal status: on going assessed 04/05/2022   3. Patient will demonstrate bilateral hip MMT 5/5 throughout to facilitate improved transfers, ambulation towards independence.  Goal status:on going assessed 04/05/22   4.  Patient will demonstrate BERG testing > or = 45 to indicate reduced fall risk.  Goal status: on going assessed 04/05/2022   5.  Patient will demonstrate TUG c LRAD < or = 20 seconds to indicate reduced fall risk/ improved community ambulation.    Goal  status: on going assessed 10/92023   6.  Patient will demonstrate ability to ambulation c LRAD (SPC, independent) community distances > 300 ft.   Goal status: on going assessed 04/05/2022       PLAN: PT FREQUENCY: 1-2x/week   PT DURATION: 6 more  weeks from recert   PLANNED INTERVENTIONS: Therapeutic exercises, Therapeutic activity, Neuro Muscular re-education, Balance training, Gait training, Patient/Family education, Joint mobilization, Stair training, DME instructions, Dry Needling, Electrical stimulation, Cryotherapy, Moist heat, Taping, Ultrasound, Ionotophoresis 35m/ml Dexamethasone, and Manual therapy.  All included unless contraindicated   PLAN FOR NEXT SESSION: She will need recert after 63/94/32. Progressive WB strengthening, specifically Lt leg.  Balance improvements.     Elsie Ra, PT, DPT 05/13/22 4:28 PM

## 2022-05-14 ENCOUNTER — Other Ambulatory Visit: Payer: Self-pay | Admitting: Internal Medicine

## 2022-05-14 DIAGNOSIS — M47817 Spondylosis without myelopathy or radiculopathy, lumbosacral region: Secondary | ICD-10-CM

## 2022-05-14 DIAGNOSIS — M170 Bilateral primary osteoarthritis of knee: Secondary | ICD-10-CM

## 2022-05-14 DIAGNOSIS — M961 Postlaminectomy syndrome, not elsewhere classified: Secondary | ICD-10-CM

## 2022-05-17 ENCOUNTER — Encounter: Payer: PPO | Admitting: Rehabilitative and Restorative Service Providers"

## 2022-05-17 ENCOUNTER — Encounter: Payer: Self-pay | Admitting: Physical Therapy

## 2022-05-17 ENCOUNTER — Ambulatory Visit (INDEPENDENT_AMBULATORY_CARE_PROVIDER_SITE_OTHER): Payer: PPO | Admitting: Physical Therapy

## 2022-05-17 DIAGNOSIS — M6281 Muscle weakness (generalized): Secondary | ICD-10-CM

## 2022-05-17 DIAGNOSIS — R2689 Other abnormalities of gait and mobility: Secondary | ICD-10-CM

## 2022-05-17 DIAGNOSIS — M542 Cervicalgia: Secondary | ICD-10-CM

## 2022-05-17 DIAGNOSIS — R293 Abnormal posture: Secondary | ICD-10-CM | POA: Diagnosis not present

## 2022-05-17 NOTE — Therapy (Signed)
OUTPATIENT PHYSICAL THERAPY TREATMENT NOTE  Patient Name: Claudia [name] MRN: [id number] DOB:Dec 23, 1933, 88 y.o., female Today's Date: 05/17/2022   END OF SESSION:   PT End of Session - 05/17/22 1306     Visit Number 23    Number of Visits 25    Date for PT Re-Evaluation 05/17/22    Authorization Type [employer] $15 copay    Progress Note Due on Visit 24   did last one at #14   PT Start Time 1300    PT Stop Time 1345    PT Time Calculation (min) 45 min    Activity Tolerance Patient tolerated treatment well    Behavior During Therapy WFL for tasks assessed/performed             Past Medical History:  Diagnosis Date   Abnormality of gait    Brachial neuritis or radiculitis NOS    Degeneration of lumbar or lumbosacral intervertebral disc    Dysrhythmia    Essential hypertension, benign    Gouty arthropathy    Lumbago    Obesity    Osteoarthritis    Pure hypercholesterolemia    Unspecified hypothyroidism    Past Surgical History:  Procedure Laterality Date   ABDOMINAL HYSTERECTOMY  03/13/2010   ANTERIOR CERVICAL DECOMP/DISCECTOMY FUSION N/A 10/19/2021   Procedure: C3-4, C4-5, C5-6 ANTERIOR CERVICAL DISCECTOMY FUSION, ALLOGRAFT, PLATE;  Surgeon: [name], MD;  Location: [place];  Service: Orthopedics;  Laterality: N/A;   BACK SURGERY     LUMBAR LAMINECTOMY  03/13/2010   right knee replacement  03/13/2010   Patient Active Problem List   Diagnosis Date Noted   Deficiency anemia 02/15/2022   Vitamin B12 deficiency neuropathy ([place]) 02/15/2022   Hypercalcemia 02/15/2022   S/P cervical spinal fusion 11/10/2021   Cervical stricture or stenosis 10/20/2021   Cervical spinal stenosis 10/19/2021   Meningioma ([place]) 08/24/2021   PAF (paroxysmal atrial fibrillation) ([place]) 11/03/2020   Stage 3b chronic kidney disease ([place]) 05/06/2020   Chronic left-sided low back pain without sciatica 12/25/2018   Spinal stenosis in cervical region 05/12/2016   Therapeutic  opioid-induced constipation ([facility]) 01/01/2016   Chronic idiopathic constipation 09/11/2013   Lumbosacral spondylosis without myelopathy 04/09/2013   Postlaminectomy syndrome, lumbar region 04/09/2013   Anticoagulation management encounter 02/01/2012   Neuropathy, peripheral 08/04/2011   Pure hypercholesterolemia 08/04/2011   Essential hypertension, benign 08/04/2011   Hypothyroidism 08/04/2011   DJD (degenerative joint disease) of knee 08/04/2011   Gout 08/04/2011      THERAPY DIAG:  Cervicalgia  Other abnormalities of gait and mobility  Muscle weakness (generalized)  Abnormal posture  PCP: Janith Lima. MD   REFERRING PROVIDER: [name], MD   REFERRING DIAG: Z98.1 (ICD-10-CM) - S/P cervical spinal fusion    Rationale for Evaluation and Treatment Rehabilitation   ONSET DATE: Dec 2022   SUBJECTIVE:  SUBJECTIVE STATEMENT: She is not having pain, wants to know if she can try to start walking some at home with [facility].   PERTINENT HISTORY:  History of 10/19/21 C3-4 C4-5 C5-6 ACDF with soft collar PMH bracial neuritis, abnormal gait, Degeneration of lumbar, HTN, gouty arthropathy, OA, lumbar laminectomy 2011, Rt TKA   PAIN:  No pain upon arrival.    PRECAUTIONS: None   WEIGHT BEARING RESTRICTIONS No   FALLS:  Has patient fallen in last 6 months? Maybe 1 fall in last 6 months.  Several falls within last year.    LIVING ENVIRONMENT: Lives with:lives alone Lives in: House/apartment Stairs: 3 steps to enter deck, rail on Lt going up.  No stairs in house.  Has following equipment at home: FWW, cane   OCCUPATION: retired    PLOF: Independent  , cooking/cleaning, shopping/grocery store   [place] stronger, walking independently.     OBJECTIVE:     PATIENT SURVEYS:  01/25/2022 No foto - incorrect set up    COGNITION: 01/25/2022 Overall cognitive status: Within functional limits for tasks assessed     SENSATION: 01/25/2022 not tested today   POSTURE:  01/25/2022 rounded shoulders, forward head, increased thoracic kyphosis, and flexed trunk    PALPATION: 01/25/2022 no specific tenderness noted in screening for balance.       MMT:            01/25/2022:  Seated resisted static testing hip abduction Rt: strong/painless. Lt moderate strength, painless (held MMT due to difficulty in bed mobility positioning MMT Right 01/25/2022 Left 01/25/2022 Left 02/22/2022 Left 03/11/2022 Left 03/16/22 Left 04/01/2022 Left 04/05/22  Shoulder flexion           Shoulder extension           Shoulder abduction           Shoulder adduction           Shoulder extension           Shoulder internal rotation           Shoulder external rotation                                   Hip flexion 5/5 4/5 4/5 4/5 4/5 4+/5 4+  Hip extension           Hip abduction           Knee flexion 5/5 5/5       Knee extension  5/5 5/5    4+/5 4+  Ankle DF 5/5 4/5  4/5 4+/5     (Blank rows = not tested)   CERVICAL SPECIAL TESTS:  01/25/2022 none performed   FUNCTIONAL TESTS:  05/06/2022: TUG c FWW:  37 seconds at end of session  04/05/22 TUG 38.5s seconds with FWW 03/22/22: TUG 44 seconds C FWW (done at very end of session) 03/08/2022:   TUG:  41.76 seconds c FWW 01/25/2022        TUG:   71 seconds c FWW   03/22/2022  Standardized Balance Assessment  Standardized Balance Assessment Berg Balance Test  Berg Balance Test  Sit to Stand 3  Standing Unsupported 3  Sitting with Back Unsupported but Feet Supported on Floor or Stool 4  Stand to Sit 3  Transfers 3  Standing Unsupported with Eyes Closed 3  Standing Unsupported with Feet Together 3  From Standing, Reach Forward with  Outstretched Arm 3  From Standing Position, Pick up Object from Floor 3  From Standing  Position, Turn to Look Behind Over each Shoulder 2  Turn 360 Degrees 1  Standing Unsupported, Alternately Place Feet on Step/Stool 0  Standing Unsupported, One Foot in Front 1  Standing on One Leg 0  Total Score 32        [facility] PT Assessment - 01/25/22 0001                Standardized Balance Assessment    Standardized Balance Assessment Berg Balance Test          Berg Balance Test    Sit to Stand Able to stand using hands after several tries     Standing Unsupported Able to stand 2 minutes with supervision     Sitting with Back Unsupported but Feet Supported on Floor or Stool Able to sit safely and securely 2 minutes     Stand to Sit Uses backs of legs against chair to control descent     Transfers Able to transfer safely, definite need of hands     Standing Unsupported with Eyes Closed Able to stand 3 seconds     Standing Unsupported with Feet Together Needs help to attain position but able to stand for 30 seconds with feet together     From Standing, Reach Forward with Outstretched Arm Can reach forward >5 cm safely (2")     From Standing Position, Pick up Object from Floor Unable to pick up and needs supervision     From Standing Position, Turn to Look Behind Over each Shoulder Needs assist to keep from losing balance and falling     Turn 360 Degrees Needs assistance while turning     Standing Unsupported, Alternately Place Feet on Step/Stool Needs assistance to keep from falling or unable to try     Standing Unsupported, One Foot in Front Needs help to step but can hold 15 seconds     Standing on One Leg Unable to try or needs assist to prevent fall     Total Score 21                GAIT ASSESSMENT: 03/18/2022:  FWW into clinic c mod independent, decreased gait speed.  SPC use in Rt UE c CGA 33 ft today c cues for sequencing.    TODAY'S TREATMENT:  05/17/2022 Therex: Nu step L5 X 6 min UE/LE Gait with SPC in Rt UE, at counter top 3 round trips, began with light UE  support from left on counter top and progressed to no UE support with close supervision, then ambulated 25 feet X 2 with SPC and hand held assist Step up 4 inch fwd x 10 bilateral c bilateral hand assis Recumbent bike L2-1 (11 min total) Leg press Double leg 62 lbs 2 x 15, single leg Lt 31 lbs 2 x 15, single leg Rt 37 lbs 2 x 15 Seated LAQ 2.5# 2 X 15 bilat     PATIENT EDUCATION:  05/06/2022 Education details: HEP update Person educated: Patient Education method: Consulting civil engineer, Demonstration, Verbal cues, and Handouts Education comprehension: verbalized understanding, returned demonstration, and verbal cues required     HOME EXERCISE PROGRAM: Access Code: [email or web address] URL: [email or web address] Date: 05/06/2022 Prepared by: Scot Jun  Exercises - Seated March  - 1-2 x daily - 7 x weekly - 1-2 sets - 10 reps - Sit to Stand  - 2-3 x daily - 7 x weekly - 1  sets - 5-10 reps - Seated Long Arc Quad  - 2-3 x daily - 7 x weekly - 1-2 sets - 10 reps - 2 hold - Standing Scapular Retraction  - 5 x daily - 7 x weekly - 1 sets - 5 reps - 5 second hold - Heel Toe Raises with Counter Support  - 1-2 x daily - 7 x weekly - 1-2 sets - 10 reps - Standing Tandem Balance with Counter Support  - 1-2 x daily - 7 x weekly - 1 sets - 10 reps - Seated Straight Leg Heel Taps  - 1-2 x daily - 7 x weekly - 1-2 sets - 5-10 reps - Seated Quad Set (Mirrored)  - 1-2 x daily - 7 x weekly - 1 sets - 10 reps - 5 hold - Small Range Straight Leg Raise  - 1-2 x daily - 7 x weekly - 1-2 sets - 10-15 reps   ASSESSMENT:   CLINICAL IMPRESSION: We tried walking with SPC but she is more unsteady with this so I don't recommend she try this at home unless she has something close by to grab such as wall or counter top or she has someone there with her. I showed her how to perform this at home beside counter top so she is welcome to add that at home if she feels safe to do so.    OBJECTIVE IMPAIRMENTS Abnormal  gait, decreased activity tolerance, decreased balance, decreased endurance, decreased mobility, difficulty walking, decreased strength, impaired perceived functional ability, impaired flexibility, improper body mechanics, postural dysfunction, and pain.    ACTIVITY LIMITATIONS carrying, lifting, bending, standing, squatting, stairs, transfers, bed mobility, reach over head, and locomotion level   PARTICIPATION LIMITATIONS: meal prep, cleaning, interpersonal relationship, driving, shopping, and community activity   PERSONAL FACTORS   History of 10/19/21 C3-4 C4-5 C5-6 ACDF with soft collar PMH bracial neuritis, abnormal gait, Degeneration of lumbar, HTN, gouty arthropathy, OA, lumbar laminectomy 2011, Rt TKA  are also affecting patient's functional outcome.    REHAB POTENTIAL: Good   CLINICAL DECISION MAKING: Stable/uncomplicated   EVALUATION COMPLEXITY: Low     GOALS: Goals reviewed with patient? Yes   Short term PT Goals (target date for Short term goals are 3 weeks 02/15/2022) Patient will demonstrate independent use of home exercise program to maintain progress from in clinic treatments. Goal status: MET   Long term PT goals (target dates for all long term goals are 10 weeks  05/17/2022 )   1. Patient will demonstrate/report pain at worst less than or equal to 2/10 to facilitate minimal limitation in daily activity secondary to pain symptoms. Goal status: MET 04/05/22   2. Patient will demonstrate independent use of home exercise program to facilitate ability to maintain/progress functional gains from skilled physical therapy services. Goal status: on going assessed 04/05/2022   3. Patient will demonstrate bilateral hip MMT 5/5 throughout to facilitate improved transfers, ambulation towards independence.  Goal status:on going assessed 04/05/22   4.  Patient will demonstrate BERG testing > or = 45 to indicate reduced fall risk.  Goal status: on going assessed 04/05/2022   5.  Patient  will demonstrate TUG c LRAD < or = 20 seconds to indicate reduced fall risk/ improved community ambulation.    Goal status: on going assessed 10/92023   6.  Patient will demonstrate ability to ambulation c LRAD (SPC, independent) community distances > 300 ft.   Goal status: on going assessed 04/05/2022  PLAN: PT FREQUENCY: 1-2x/week   PT DURATION: 6 more  weeks from recert   PLANNED INTERVENTIONS: Therapeutic exercises, Therapeutic activity, Neuro Muscular re-education, Balance training, Gait training, Patient/Family education, Joint mobilization, Stair training, DME instructions, Dry Needling, Electrical stimulation, Cryotherapy, Moist heat, Taping, Ultrasound, Ionotophoresis 59m/ml Dexamethasone, and Manual therapy.  All included unless contraindicated   PLAN FOR NEXT SESSION: She will need recert and progress note next visit. Gait training with LRAD as able. Progressive WB strengthening, specifically Lt leg.  Balance improvements.     BElsie Ra PT, DPT 05/17/22 2:00 PM

## 2022-05-18 NOTE — Telephone Encounter (Signed)
Patient should be on Xarelto '15mg'$ 

## 2022-05-19 ENCOUNTER — Telehealth: Payer: PPO

## 2022-05-24 ENCOUNTER — Encounter: Payer: PPO | Admitting: Rehabilitative and Restorative Service Providers"

## 2022-05-24 ENCOUNTER — Ambulatory Visit (INDEPENDENT_AMBULATORY_CARE_PROVIDER_SITE_OTHER): Payer: PPO | Admitting: Physical Therapy

## 2022-05-24 ENCOUNTER — Encounter: Payer: Self-pay | Admitting: Physical Therapy

## 2022-05-24 DIAGNOSIS — R2689 Other abnormalities of gait and mobility: Secondary | ICD-10-CM

## 2022-05-24 DIAGNOSIS — M6281 Muscle weakness (generalized): Secondary | ICD-10-CM | POA: Diagnosis not present

## 2022-05-24 DIAGNOSIS — M542 Cervicalgia: Secondary | ICD-10-CM | POA: Diagnosis not present

## 2022-05-24 DIAGNOSIS — R293 Abnormal posture: Secondary | ICD-10-CM | POA: Diagnosis not present

## 2022-05-24 NOTE — Therapy (Signed)
OUTPATIENT PHYSICAL THERAPY TREATMENT NOTE/Progress note/RECERT Progress Note reporting period 04/05/22 to 05/24/22  See below for objective and subjective measurements relating to patients progress with PT.    Patient Name: Claudia [name] MRN: [id number] DOB:27-Apr-1934, 88 y.o., female Today's Date: 05/24/2022   END OF SESSION:   PT End of Session - 05/24/22 1329     Visit Number 24    Number of Visits 40    Date for PT Re-Evaluation 07/19/22    Authorization Type [employer] $15 copay    Progress Note Due on Visit 34   did last one at #14   PT Start Time 1300    PT Stop Time 1345    PT Time Calculation (min) 45 min    Activity Tolerance Patient tolerated treatment well    Behavior During Therapy WFL for tasks assessed/performed              Past Medical History:  Diagnosis Date   Abnormality of gait    Brachial neuritis or radiculitis NOS    Degeneration of lumbar or lumbosacral intervertebral disc    Dysrhythmia    Essential hypertension, benign    Gouty arthropathy    Lumbago    Obesity    Osteoarthritis    Pure hypercholesterolemia    Unspecified hypothyroidism    Past Surgical History:  Procedure Laterality Date   ABDOMINAL HYSTERECTOMY  03/13/2010   ANTERIOR CERVICAL DECOMP/DISCECTOMY FUSION N/A 10/19/2021   Procedure: C3-4, C4-5, C5-6 ANTERIOR CERVICAL DISCECTOMY FUSION, ALLOGRAFT, PLATE;  Surgeon: [name], MD;  Location: [place];  Service: Orthopedics;  Laterality: N/A;   BACK SURGERY     LUMBAR LAMINECTOMY  03/13/2010   right knee replacement  03/13/2010   Patient Active Problem List   Diagnosis Date Noted   Deficiency anemia 02/15/2022   Vitamin B12 deficiency neuropathy ([place]) 02/15/2022   Hypercalcemia 02/15/2022   S/P cervical spinal fusion 11/10/2021   Cervical stricture or stenosis 10/20/2021   Cervical spinal stenosis 10/19/2021   Meningioma ([place]) 08/24/2021   PAF (paroxysmal atrial fibrillation) ([place]) 11/03/2020   Stage 3b chronic  kidney disease ([place]) 05/06/2020   Chronic left-sided low back pain without sciatica 12/25/2018   Spinal stenosis in cervical region 05/12/2016   Therapeutic opioid-induced constipation ([facility]) 01/01/2016   Chronic idiopathic constipation 09/11/2013   Lumbosacral spondylosis without myelopathy 04/09/2013   Postlaminectomy syndrome, lumbar region 04/09/2013   Anticoagulation management encounter 02/01/2012   Neuropathy, peripheral 08/04/2011   Pure hypercholesterolemia 08/04/2011   Essential hypertension, benign 08/04/2011   Hypothyroidism 08/04/2011   DJD (degenerative joint disease) of knee 08/04/2011   Gout 08/04/2011      THERAPY DIAG:  Cervicalgia  Other abnormalities of gait and mobility  Muscle weakness (generalized)  Abnormal posture  PCP: Janith Lima. MD   REFERRING PROVIDER: [name], MD   REFERRING DIAG: Z98.1 (ICD-10-CM) - S/P cervical spinal fusion    Rationale for Evaluation and Treatment Rehabilitation   ONSET DATE: Dec 2022   SUBJECTIVE:  SUBJECTIVE STATEMENT: She has not had any pain, she still feels like she needs PT for balance and leg strength. She feels she is slowly improving with PT   PERTINENT HISTORY:  History of 10/19/21 C3-4 C4-5 C5-6 ACDF with soft collar PMH bracial neuritis, abnormal gait, Degeneration of lumbar, HTN, gouty arthropathy, OA, lumbar laminectomy 2011, Rt TKA   PAIN:  No pain upon arrival.    PRECAUTIONS: None   WEIGHT BEARING RESTRICTIONS No   FALLS:  Has patient fallen in last 6 months? Maybe 1 fall in last 6 months.  Several falls within last year.    LIVING ENVIRONMENT: Lives with:lives alone Lives in: House/apartment Stairs: 3 steps to enter deck, rail on Lt going up.  No stairs in house.  Has following  equipment at home: FWW, cane   OCCUPATION: retired    PLOF: Independent  , cooking/cleaning, shopping/grocery store   [place] stronger, walking independently.     OBJECTIVE:    PATIENT SURVEYS:  01/25/2022 No foto - incorrect set up    COGNITION: 01/25/2022 Overall cognitive status: Within functional limits for tasks assessed     SENSATION: 01/25/2022 not tested today   POSTURE:  01/25/2022 rounded shoulders, forward head, increased thoracic kyphosis, and flexed trunk    PALPATION: 01/25/2022 no specific tenderness noted in screening for balance.       MMT:            01/25/2022:  Seated resisted static testing hip abduction Rt: strong/painless. Lt moderate strength, painless (held MMT due to difficulty in bed mobility positioning MMT Right 01/25/2022 Left 01/25/2022 Left 02/22/2022 Left 03/11/2022 Left 03/16/22 Left 04/01/2022 Left 04/05/22 Left/Right 05/24/22  Shoulder flexion            Shoulder extension            Shoulder abduction            Shoulder adduction            Shoulder extension            Shoulder internal rotation            Shoulder external rotation                                      Hip flexion 5/5 4/5 4/5 4/5 4/5 4+/5 4+ 4+/4+  Hip extension            Hip abduction            Knee flexion 5/5 5/5        Knee extension  5/5 5/5    4+/5 4+ 4+/4+  Ankle DF 5/5 4/5  4/5 4+/5      (Blank rows = not tested)   CERVICAL SPECIAL TESTS:  01/25/2022 none performed   FUNCTIONAL TESTS:  05/24/22: TUG c FWW:  36 seconds  05/06/2022: TUG c FWW:  37 seconds at end of session  04/05/22 TUG 38.5s seconds with FWW 03/22/22: TUG 44 seconds C FWW (done at very end of session) 03/08/2022:   TUG:  41.76 seconds c FWW 01/25/2022        TUG:   71 seconds c FWW    [facility] PT Assessment - 05/24/22 0001       Standardized Balance Assessment   Standardized Balance Assessment Berg Balance Test      Berg Balance Test   Sit  to Stand Able to stand   independently using hands    Standing Unsupported Able to stand 2 minutes with supervision    Sitting with Back Unsupported but Feet Supported on Floor or Stool Able to sit safely and securely 2 minutes    Stand to Sit Controls descent by using hands    Transfers Able to transfer safely, definite need of hands    Standing Unsupported with Eyes Closed Able to stand 10 seconds with supervision    Standing Unsupported with Feet Together Able to place feet together independently and stand for 1 minute with supervision    From Standing, Reach Forward with Outstretched Arm Can reach forward >12 cm safely (5")    From Standing Position, Pick up Object from [place] to pick up shoe, needs supervision    From Standing Position, Turn to Look Behind Over each Shoulder Turn sideways only but maintains balance    Turn 360 Degrees Needs close supervision or verbal cueing    Standing Unsupported, Alternately Place Feet on Step/Stool Needs assistance to keep from falling or unable to try    Standing Unsupported, One Foot in Front Needs help to step but can hold 15 seconds    Standing on One Leg Unable to try or needs assist to prevent fall    Total Score 32                   [facility] PT Assessment - 01/25/22 0001                Standardized Balance Assessment    Standardized Balance Assessment Berg Balance Test          Berg Balance Test    Sit to Stand Able to stand using hands after several tries     Standing Unsupported Able to stand 2 minutes with supervision     Sitting with Back Unsupported but Feet Supported on Floor or Stool Able to sit safely and securely 2 minutes     Stand to Sit Uses backs of legs against chair to control descent     Transfers Able to transfer safely, definite need of hands     Standing Unsupported with Eyes Closed Able to stand 3 seconds     Standing Unsupported with Feet Together Needs help to attain position but able to stand for 30 seconds with feet together      From Standing, Reach Forward with Outstretched Arm Can reach forward >5 cm safely (2")     From Standing Position, Pick up Object from Floor Unable to pick up and needs supervision     From Standing Position, Turn to Look Behind Over each Shoulder Needs assist to keep from losing balance and falling     Turn 360 Degrees Needs assistance while turning     Standing Unsupported, Alternately Place Feet on Step/Stool Needs assistance to keep from falling or unable to try     Standing Unsupported, One Foot in Front Needs help to step but can hold 15 seconds     Standing on One Leg Unable to try or needs assist to prevent fall     Total Score 21                GAIT ASSESSMENT: 03/18/2022:  FWW into clinic c mod independent, decreased gait speed.  SPC use in Rt UE c CGA 33 ft today c cues for sequencing.    TODAY'S TREATMENT:  05/24/2022 Therex:  Updated BERG balance  test, TUG test, and MMT, see above for   Details  Max tolerated gait with RW 160 feet mod I Gait with SPC in Rt UE and HHA left UE ambulated 25 feet X 2  Recumbent bike L2-1 (5 min total) Leg press Double leg 75 lbs 2 x 10, single leg Lt 37 lbs 2 x 10 bilat Seated LAQ 2.5# 2 X 15 bilat     PATIENT EDUCATION:  05/06/2022 Education details: HEP update Person educated: Patient Education method: Consulting civil engineer, Demonstration, Verbal cues, and Handouts Education comprehension: verbalized understanding, returned demonstration, and verbal cues required     HOME EXERCISE PROGRAM: Access Code: [email or web address] URL: [email or web address] Date: 05/06/2022 Prepared by: Scot Jun  Exercises - Seated March  - 1-2 x daily - 7 x weekly - 1-2 sets - 10 reps - Sit to Stand  - 2-3 x daily - 7 x weekly - 1 sets - 5-10 reps - Seated Long Arc Quad  - 2-3 x daily - 7 x weekly - 1-2 sets - 10 reps - 2 hold - Standing Scapular Retraction  - 5 x daily - 7 x weekly - 1 sets - 5 reps - 5 second hold - Heel Toe Raises with Counter  Support  - 1-2 x daily - 7 x weekly - 1-2 sets - 10 reps - Standing Tandem Balance with Counter Support  - 1-2 x daily - 7 x weekly - 1 sets - 10 reps - Seated Straight Leg Heel Taps  - 1-2 x daily - 7 x weekly - 1-2 sets - 5-10 reps - Seated Quad Set (Mirrored)  - 1-2 x daily - 7 x weekly - 1 sets - 10 reps - 5 hold - Small Range Straight Leg Raise  - 1-2 x daily - 7 x weekly - 1-2 sets - 10-15 reps   ASSESSMENT:   CLINICAL IMPRESSION: Progress note reflects some slow but functional progress overall in her gait speed and leg strength. We are trying to progress her some from RW to [facility] but has unsteadiness and difficulty with this requiring hand held assist. She has not yet met PT goals so PT recommending to extend her PT plan of care up to another 8 weeks to maximize her functional abilities for ADL's and reduce her risk of falling.   OBJECTIVE IMPAIRMENTS Abnormal gait, decreased activity tolerance, decreased balance, decreased endurance, decreased mobility, difficulty walking, decreased strength, impaired perceived functional ability, impaired flexibility, improper body mechanics, postural dysfunction, and pain.    ACTIVITY LIMITATIONS carrying, lifting, bending, standing, squatting, stairs, transfers, bed mobility, reach over head, and locomotion level   PARTICIPATION LIMITATIONS: meal prep, cleaning, interpersonal relationship, driving, shopping, and community activity   PERSONAL FACTORS   History of 10/19/21 C3-4 C4-5 C5-6 ACDF with soft collar PMH bracial neuritis, abnormal gait, Degeneration of lumbar, HTN, gouty arthropathy, OA, lumbar laminectomy 2011, Rt TKA  are also affecting patient's functional outcome.    REHAB POTENTIAL: Good   CLINICAL DECISION MAKING: Stable/uncomplicated   EVALUATION COMPLEXITY: Low     GOALS: Goals reviewed with patient? Yes   Short term PT Goals (target date for Short term goals are 4 weeks 06/23/22) Patient will demonstrate independent use of home  exercise program to maintain progress from in clinic treatments. Goal status: MET   Long term PT goals (target dates for all long term goals are 10 weeks  07/21/2022 )   1. Patient will demonstrate/report pain at worst less than or equal to 2/10 to facilitate minimal  limitation in daily activity secondary to pain symptoms. Goal status: MET 04/05/22   2. Patient will demonstrate independent use of home exercise program to facilitate ability to maintain/progress functional gains from skilled physical therapy services. Goal status: MET 05/24/22   3. Patient will demonstrate bilateral hip MMT 5/5 throughout to facilitate improved transfers, ambulation towards independence.  Goal status:on going assessed 04/05/22   4.  Patient will demonstrate BERG testing > or = 45 to indicate reduced fall risk.  Goal status: on going assessed, score of 32 on 05/24/22   5.  Patient will demonstrate TUG c LRAD < or = 20 seconds to indicate reduced fall risk/ improved community ambulation.    Goal status: on going, now 36 seconds 05/24/22   6.  Patient will demonstrate ability to ambulation c LRAD (SPC, independent) community distances > 300 ft.   Goal status: on going, made it 160 feet with RW on 05/24/22       PLAN: PT FREQUENCY: 1-2x/week   PT DURATION: 6 more  weeks from recert   PLANNED INTERVENTIONS: Therapeutic exercises, Therapeutic activity, Neuro Muscular re-education, Balance training, Gait training, Patient/Family education, Joint mobilization, Stair training, DME instructions, Dry Needling, Electrical stimulation, Cryotherapy, Moist heat, Taping, Ultrasound, Ionotophoresis 21m/ml Dexamethasone, and Manual therapy.  All included unless contraindicated   PLAN FOR NEXT SESSION: Look to see if MD signed off on recert. Gait training with LRAD as able. Progressive WB strengthening, specifically Lt leg.  Balance improvements.     BElsie Ra PT, DPT 05/24/22 1:55 PM

## 2022-05-25 DIAGNOSIS — M47817 Spondylosis without myelopathy or radiculopathy, lumbosacral region: Secondary | ICD-10-CM | POA: Diagnosis not present

## 2022-05-27 ENCOUNTER — Encounter: Payer: Self-pay | Admitting: Rehabilitative and Restorative Service Providers"

## 2022-05-27 ENCOUNTER — Ambulatory Visit (INDEPENDENT_AMBULATORY_CARE_PROVIDER_SITE_OTHER): Payer: PPO | Admitting: Rehabilitative and Restorative Service Providers"

## 2022-05-27 DIAGNOSIS — R293 Abnormal posture: Secondary | ICD-10-CM | POA: Diagnosis not present

## 2022-05-27 DIAGNOSIS — R2689 Other abnormalities of gait and mobility: Secondary | ICD-10-CM | POA: Diagnosis not present

## 2022-05-27 DIAGNOSIS — M6281 Muscle weakness (generalized): Secondary | ICD-10-CM

## 2022-05-27 DIAGNOSIS — M542 Cervicalgia: Secondary | ICD-10-CM | POA: Diagnosis not present

## 2022-05-27 NOTE — Therapy (Signed)
OUTPATIENT PHYSICAL THERAPY TREATMENT NOTE    Patient Name: Claudia [name] MRN: [id number] DOB:1934/02/15, 88 y.o., female Today's Date: 05/27/2022   END OF SESSION:   PT End of Session - 05/27/22 1618     Visit Number 25    Number of Visits 40    Date for PT Re-Evaluation 07/19/22    Authorization Type [employer] $15 copay    Progress Note Due on Visit 34   did last one at #14   PT Start Time 1558    PT Stop Time 1638    PT Time Calculation (min) 40 min    Equipment Utilized During Treatment Gait belt    Activity Tolerance Patient tolerated treatment well    Behavior During Therapy WFL for tasks assessed/performed               Past Medical History:  Diagnosis Date   Abnormality of gait    Brachial neuritis or radiculitis NOS    Degeneration of lumbar or lumbosacral intervertebral disc    Dysrhythmia    Essential hypertension, benign    Gouty arthropathy    Lumbago    Obesity    Osteoarthritis    Pure hypercholesterolemia    Unspecified hypothyroidism    Past Surgical History:  Procedure Laterality Date   ABDOMINAL HYSTERECTOMY  03/13/2010   ANTERIOR CERVICAL DECOMP/DISCECTOMY FUSION N/A 10/19/2021   Procedure: C3-4, C4-5, C5-6 ANTERIOR CERVICAL DISCECTOMY FUSION, ALLOGRAFT, PLATE;  Surgeon: [name], MD;  Location: [facility];  Service: Orthopedics;  Laterality: N/A;   BACK SURGERY     LUMBAR LAMINECTOMY  03/13/2010   right knee replacement  03/13/2010   Patient Active Problem List   Diagnosis Date Noted   Deficiency anemia 02/15/2022   Vitamin B12 deficiency neuropathy ([place]) 02/15/2022   Hypercalcemia 02/15/2022   S/P cervical spinal fusion 11/10/2021   Cervical stricture or stenosis 10/20/2021   Cervical spinal stenosis 10/19/2021   Meningioma ([place]) 08/24/2021   PAF (paroxysmal atrial fibrillation) ([place]) 11/03/2020   Stage 3b chronic kidney disease ([place]) 05/06/2020   Chronic left-sided low back pain without sciatica 12/25/2018   Spinal stenosis  in cervical region 05/12/2016   Therapeutic opioid-induced constipation (OIC) 01/01/2016   Chronic idiopathic constipation 09/11/2013   Lumbosacral spondylosis without myelopathy 04/09/2013   Postlaminectomy syndrome, lumbar region 04/09/2013   Anticoagulation management encounter 02/01/2012   Neuropathy, peripheral 08/04/2011   Pure hypercholesterolemia 08/04/2011   Essential hypertension, benign 08/04/2011   Hypothyroidism 08/04/2011   DJD (degenerative joint disease) of knee 08/04/2011   Gout 08/04/2011      THERAPY DIAG:  Cervicalgia  Other abnormalities of gait and mobility  Muscle weakness (generalized)  Abnormal posture  PCP: Janith Lima. MD   REFERRING PROVIDER: [name], MD   REFERRING DIAG: Z98.1 (ICD-10-CM) - S/P cervical spinal fusion    Rationale for Evaluation and Treatment Rehabilitation   ONSET DATE: Dec 2022   SUBJECTIVE:  SUBJECTIVE STATEMENT: She has not had any pain, she still feels like she needs PT for balance and leg strength. She feels she is slowly improving with PT   PERTINENT HISTORY:  History of 10/19/21 C3-4 C4-5 C5-6 ACDF with soft collar PMH bracial neuritis, abnormal gait, Degeneration of lumbar, HTN, gouty arthropathy, OA, lumbar laminectomy 2011, Rt TKA   PAIN:  No pain upon arrival.    PRECAUTIONS: None   WEIGHT BEARING RESTRICTIONS No   FALLS:  Has patient fallen in last 6 months? Maybe 1 fall in last 6 months.  Several falls within last year.    LIVING ENVIRONMENT: Lives with:lives alone Lives in: House/apartment Stairs: 3 steps to enter deck, rail on Lt going up.  No stairs in house.  Has following equipment at home: FWW, cane   OCCUPATION: retired    PLOF: Independent  , cooking/cleaning, shopping/grocery store    [place] stronger, walking independently.     OBJECTIVE:    PATIENT SURVEYS:  01/25/2022 No foto - incorrect set up    COGNITION: 01/25/2022 Overall cognitive status: Within functional limits for tasks assessed     SENSATION: 01/25/2022 not tested today   POSTURE:  01/25/2022 rounded shoulders, forward head, increased thoracic kyphosis, and flexed trunk    PALPATION: 01/25/2022 no specific tenderness noted in screening for balance.       MMT:            01/25/2022:  Seated resisted static testing hip abduction Rt: strong/painless. Lt moderate strength, painless (held MMT due to difficulty in bed mobility positioning MMT Right 01/25/2022 Left 01/25/2022 Left 02/22/2022 Left 03/11/2022 Left 03/16/22 Left 04/01/2022 Left 04/05/22 Left/Right 05/24/22  Shoulder flexion            Shoulder extension            Shoulder abduction            Shoulder adduction            Shoulder extension            Shoulder internal rotation            Shoulder external rotation                                      Hip flexion 5/5 4/5 4/5 4/5 4/5 4+/5 4+ 4+/4+  Hip extension            Hip abduction            Knee flexion 5/5 5/5        Knee extension  5/5 5/5    4+/5 4+ 4+/4+  Ankle DF 5/5 4/5  4/5 4+/5      (Blank rows = not tested)   CERVICAL SPECIAL TESTS:  01/25/2022 none performed   FUNCTIONAL TESTS:  05/24/22: TUG c FWW:  36 seconds  05/06/2022: TUG c FWW:  37 seconds at end of session  04/05/22 TUG 38.5s seconds with FWW 03/22/22: TUG 44 seconds C FWW (done at very end of session) 03/08/2022:   TUG:  41.76 seconds c FWW 01/25/2022        TUG:   71 seconds c FWW            [facility] PT Assessment - 01/25/22 0001                Standardized Balance Assessment  Standardized Balance Assessment Berg Balance Test          Berg Balance Test    Sit to Stand Able to stand using hands after several tries     Standing Unsupported Able to stand 2 minutes with supervision      Sitting with Back Unsupported but Feet Supported on Floor or Stool Able to sit safely and securely 2 minutes     Stand to Sit Uses backs of legs against chair to control descent     Transfers Able to transfer safely, definite need of hands     Standing Unsupported with Eyes Closed Able to stand 3 seconds     Standing Unsupported with Feet Together Needs help to attain position but able to stand for 30 seconds with feet together     From Standing, Reach Forward with Outstretched Arm Can reach forward >5 cm safely (2")     From Standing Position, Pick up Object from Floor Unable to pick up and needs supervision     From Standing Position, Turn to Look Behind Over each Shoulder Needs assist to keep from losing balance and falling     Turn 360 Degrees Needs assistance while turning     Standing Unsupported, Alternately Place Feet on Step/Stool Needs assistance to keep from falling or unable to try     Standing Unsupported, One Foot in Front Needs help to step but can hold 15 seconds     Standing on One Leg Unable to try or needs assist to prevent fall     Total Score 21                GAIT ASSESSMENT: 03/18/2022:  FWW into clinic c mod independent, decreased gait speed.  SPC use in Rt UE c CGA 33 ft today c cues for sequencing.    TODAY'S TREATMENT:  05/27/2022: Therex: Recumbent bike Lv1 2 - 5 mins, lvl 1 5 mins Leg press Double leg  2 x 10 75 lbs   , single leg 37 lbs 2 x 12 bilateral Step over 4 inch step c weight shift fwd/back (BIG inspired stepping) x 10 bilateral c single hand rail use and CGA Seated LAQ 2 x 15 bilateral 2.5 lbs  Sit to stand to sit c CGA to occasional min A , no UE use  18 inch chair x 5  Additional time required to perform activity to allow proper resting to prevent fatigue as well as encouragement for slow control movement focus.    05/24/2022 Therex:  Updated BERG balance test, TUG test, and MMT, see above for   Details  Max tolerated gait with RW 160  feet mod I Gait with SPC in Rt UE and HHA left UE ambulated 25 feet X 2  Recumbent bike L2-1 (5 min total) Leg press Double leg 75 lbs 2 x 10, single leg Lt 37 lbs 2 x 10 bilat Seated LAQ 2.5# 2 X 15 bilat     PATIENT EDUCATION:  05/06/2022 Education details: HEP update Person educated: Patient Education method: Consulting civil engineer, Demonstration, Verbal cues, and Handouts Education comprehension: verbalized understanding, returned demonstration, and verbal cues required     HOME EXERCISE PROGRAM: Access Code: [email or web address] URL: [email or web address] Date: 05/06/2022 Prepared by: Scot Jun  Exercises - Seated March  - 1-2 x daily - 7 x weekly - 1-2 sets - 10 reps - Sit to Stand  - 2-3 x daily - 7 x weekly - 1 sets - 5-10 reps - Seated Long  Arc Quad  - 2-3 x daily - 7 x weekly - 1-2 sets - 10 reps - 2 hold - Standing Scapular Retraction  - 5 x daily - 7 x weekly - 1 sets - 5 reps - 5 second hold - Heel Toe Raises with Counter Support  - 1-2 x daily - 7 x weekly - 1-2 sets - 10 reps - Standing Tandem Balance with Counter Support  - 1-2 x daily - 7 x weekly - 1 sets - 10 reps - Seated Straight Leg Heel Taps  - 1-2 x daily - 7 x weekly - 1-2 sets - 5-10 reps - Seated Quad Set (Mirrored)  - 1-2 x daily - 7 x weekly - 1 sets - 10 reps - 5 hold - Small Range Straight Leg Raise  - 1-2 x daily - 7 x weekly - 1-2 sets - 10-15 reps   ASSESSMENT:   CLINICAL IMPRESSION:  Pt continued to present c reduced gait speed, step lengths in part due to general weakness and balance concerns.  Time spent today on techniques to improve confident and distance in step lengths to help improve gait speed.    OBJECTIVE IMPAIRMENTS Abnormal gait, decreased activity tolerance, decreased balance, decreased endurance, decreased mobility, difficulty walking, decreased strength, impaired perceived functional ability, impaired flexibility, improper body mechanics, postural dysfunction, and pain.    ACTIVITY  LIMITATIONS carrying, lifting, bending, standing, squatting, stairs, transfers, bed mobility, reach over head, and locomotion level   PARTICIPATION LIMITATIONS: meal prep, cleaning, interpersonal relationship, driving, shopping, and community activity   PERSONAL FACTORS   History of 10/19/21 C3-4 C4-5 C5-6 ACDF with soft collar PMH bracial neuritis, abnormal gait, Degeneration of lumbar, HTN, gouty arthropathy, OA, lumbar laminectomy 2011, Rt TKA  are also affecting patient's functional outcome.    REHAB POTENTIAL: Good   CLINICAL DECISION MAKING: Stable/uncomplicated   EVALUATION COMPLEXITY: Low     GOALS: Goals reviewed with patient? Yes   Short term PT Goals (target date for Short term goals are 4 weeks 06/23/22) Patient will demonstrate independent use of home exercise program to maintain progress from in clinic treatments. Goal status: MET   Long term PT goals (target dates for all long term goals are 10 weeks  07/21/2022 )   1. Patient will demonstrate/report pain at worst less than or equal to 2/10 to facilitate minimal limitation in daily activity secondary to pain symptoms. Goal status: MET 04/05/22   2. Patient will demonstrate independent use of home exercise program to facilitate ability to maintain/progress functional gains from skilled physical therapy services. Goal status: MET 05/24/22   3. Patient will demonstrate bilateral hip MMT 5/5 throughout to facilitate improved transfers, ambulation towards independence.  Goal status:on going assessed 04/05/22   4.  Patient will demonstrate BERG testing > or = 45 to indicate reduced fall risk.  Goal status: on going assessed, score of 32 on 05/24/22   5.  Patient will demonstrate TUG c LRAD < or = 20 seconds to indicate reduced fall risk/ improved community ambulation.    Goal status: on going, now 36 seconds 05/24/22   6.  Patient will demonstrate ability to ambulation c LRAD (SPC, independent) community distances > 300 ft.    Goal status: on going, made it 160 feet with RW on 05/24/22       PLAN: PT FREQUENCY: 1-2x/week   PT DURATION: 6 more  weeks from recert   PLANNED INTERVENTIONS: Therapeutic exercises, Therapeutic activity, Neuro Muscular re-education, Balance training, Gait  training, Patient/Family education, Joint mobilization, Stair training, DME instructions, Dry Needling, Electrical stimulation, Cryotherapy, Moist heat, Taping, Ultrasound, Ionotophoresis 22m/ml Dexamethasone, and Manual therapy.  All included unless contraindicated   PLAN FOR NEXT SESSION: Gait training with LRAD as able. Progressive WB strengthening, specifically Lt leg, step length improvements.     MScot Jun PT, DPT, OCS, ATC 05/27/22  4:36 PM

## 2022-05-31 ENCOUNTER — Encounter: Payer: Self-pay | Admitting: Rehabilitative and Restorative Service Providers"

## 2022-05-31 ENCOUNTER — Ambulatory Visit (INDEPENDENT_AMBULATORY_CARE_PROVIDER_SITE_OTHER): Payer: PPO | Admitting: Rehabilitative and Restorative Service Providers"

## 2022-05-31 ENCOUNTER — Ambulatory Visit (INDEPENDENT_AMBULATORY_CARE_PROVIDER_SITE_OTHER): Payer: PPO

## 2022-05-31 DIAGNOSIS — R293 Abnormal posture: Secondary | ICD-10-CM

## 2022-05-31 DIAGNOSIS — R2689 Other abnormalities of gait and mobility: Secondary | ICD-10-CM | POA: Diagnosis not present

## 2022-05-31 DIAGNOSIS — M542 Cervicalgia: Secondary | ICD-10-CM

## 2022-05-31 DIAGNOSIS — E538 Deficiency of other specified B group vitamins: Secondary | ICD-10-CM | POA: Diagnosis not present

## 2022-05-31 DIAGNOSIS — M6281 Muscle weakness (generalized): Secondary | ICD-10-CM | POA: Diagnosis not present

## 2022-05-31 MED ORDER — CYANOCOBALAMIN 1000 MCG/ML IJ SOLN
1000.0000 ug | Freq: Once | INTRAMUSCULAR | Status: AC
  Administered 2022-05-31: 1000 ug via INTRAMUSCULAR

## 2022-05-31 NOTE — Therapy (Signed)
OUTPATIENT PHYSICAL THERAPY TREATMENT NOTE    Patient Name: Claudia [name] MRN: [id number] DOB:[birth year], 88 y.o., female Today's Date: 05/31/2022   END OF SESSION:   PT End of Session - 05/31/22 1307     Visit Number 26    Number of Visits 40    Date for PT Re-Evaluation 07/19/22    Authorization Type [employer] $15 copay    Progress Note Due on Visit 34   did last one at #14   PT Start Time 1300    PT Stop Time 1329    PT Time Calculation (min) 29 min    Equipment Utilized During Treatment --    Activity Tolerance Patient tolerated treatment well    Behavior During Therapy WFL for tasks assessed/performed                Past Medical History:  Diagnosis Date   Abnormality of gait    Brachial neuritis or radiculitis NOS    Degeneration of lumbar or lumbosacral intervertebral disc    Dysrhythmia    Essential hypertension, benign    Gouty arthropathy    Lumbago    Obesity    Osteoarthritis    Pure hypercholesterolemia    Unspecified hypothyroidism    Past Surgical History:  Procedure Laterality Date   ABDOMINAL HYSTERECTOMY  03/13/2010   ANTERIOR CERVICAL DECOMP/DISCECTOMY FUSION N/A 10/19/2021   Procedure: C3-4, C4-5, C5-6 ANTERIOR CERVICAL DISCECTOMY FUSION, ALLOGRAFT, PLATE;  Surgeon: [name], MD;  Location: [facility];  Service: Orthopedics;  Laterality: N/A;   BACK SURGERY     LUMBAR LAMINECTOMY  03/13/2010   right knee replacement  03/13/2010   Patient Active Problem List   Diagnosis Date Noted   Deficiency anemia 02/15/2022   Vitamin B12 deficiency neuropathy ([place]) 02/15/2022   Hypercalcemia 02/15/2022   S/P cervical spinal fusion 11/10/2021   Cervical stricture or stenosis 10/20/2021   Cervical spinal stenosis 10/19/2021   Meningioma ([place]) 08/24/2021   PAF (paroxysmal atrial fibrillation) ([place]) 11/03/2020   Stage 3b chronic kidney disease ([place]) 05/06/2020   Chronic left-sided low back pain without sciatica 12/25/2018   Spinal stenosis in  cervical region 05/12/2016   Therapeutic opioid-induced constipation (OIC) 01/01/2016   Chronic idiopathic constipation 09/11/2013   Lumbosacral spondylosis without myelopathy 04/09/2013   Postlaminectomy syndrome, lumbar region 04/09/2013   Anticoagulation management encounter 02/01/2012   Neuropathy, peripheral 08/04/2011   Pure hypercholesterolemia 08/04/2011   Essential hypertension, benign 08/04/2011   Hypothyroidism 08/04/2011   DJD (degenerative joint disease) of knee 08/04/2011   Gout 08/04/2011      THERAPY DIAG:  Cervicalgia  Other abnormalities of gait and mobility  Muscle weakness (generalized)  Abnormal posture  PCP: Janith Lima. MD   REFERRING PROVIDER: [name], MD   REFERRING DIAG: Z98.1 (ICD-10-CM) - S/P cervical spinal fusion    Rationale for Evaluation and Treatment Rehabilitation   ONSET DATE: Dec 2022   SUBJECTIVE:  SUBJECTIVE STATEMENT: Pt indicated she had to get to another MD visit so she had to leave earlier today.    PERTINENT HISTORY:  History of 10/19/21 C3-4 C4-5 C5-6 ACDF with soft collar PMH bracial neuritis, abnormal gait, Degeneration of lumbar, HTN, gouty arthropathy, OA, lumbar laminectomy 2011, Rt TKA   PAIN:  No pain upon arrival.    PRECAUTIONS: None   WEIGHT BEARING RESTRICTIONS No   FALLS:  Has patient fallen in last 6 months? Maybe 1 fall in last 6 months.  Several falls within last year.    LIVING ENVIRONMENT: Lives with:lives alone Lives in: House/apartment Stairs: 3 steps to enter deck, rail on Lt going up.  No stairs in house.  Has following equipment at home: FWW, cane   OCCUPATION: retired    PLOF: Independent  , cooking/cleaning, shopping/grocery store   [place] stronger, walking  independently.     OBJECTIVE:    PATIENT SURVEYS:  01/25/2022 No foto - incorrect set up    COGNITION: 01/25/2022 Overall cognitive status: Within functional limits for tasks assessed     SENSATION: 01/25/2022 not tested today   POSTURE:  01/25/2022 rounded shoulders, forward head, increased thoracic kyphosis, and flexed trunk    PALPATION: 01/25/2022 no specific tenderness noted in screening for balance.       MMT:            01/25/2022:  Seated resisted static testing hip abduction Rt: strong/painless. Lt moderate strength, painless (held MMT due to difficulty in bed mobility positioning MMT Right 01/25/2022 Left 01/25/2022 Left 02/22/2022 Left 03/11/2022 Left 03/16/22 Left 04/01/2022 Left 04/05/22 Left/Right 05/24/22  Shoulder flexion            Shoulder extension            Shoulder abduction            Shoulder adduction            Shoulder extension            Shoulder internal rotation            Shoulder external rotation                                      Hip flexion 5/5 4/5 4/5 4/5 4/5 4+/5 4+ 4+/4+  Hip extension            Hip abduction            Knee flexion 5/5 5/5        Knee extension  5/5 5/5    4+/5 4+ 4+/4+  Ankle DF 5/5 4/5  4/5 4+/5      (Blank rows = not tested)   CERVICAL SPECIAL TESTS:  01/25/2022 none performed   FUNCTIONAL TESTS:  05/24/22: TUG c FWW:  36 seconds  05/06/2022: TUG c FWW:  37 seconds at end of session  04/05/22 TUG 38.5s seconds with FWW 03/22/22: TUG 44 seconds C FWW (done at very end of session) 03/08/2022:   TUG:  41.76 seconds c FWW 01/25/2022        TUG:   71 seconds c FWW            [facility] PT Assessment - 01/25/22 0001                Standardized Balance Assessment    Standardized Balance Assessment Berg Balance Test  Berg Balance Test    Sit to Stand Able to stand using hands after several tries     Standing Unsupported Able to stand 2 minutes with supervision     Sitting with Back Unsupported but  Feet Supported on Floor or Stool Able to sit safely and securely 2 minutes     Stand to Sit Uses backs of legs against chair to control descent     Transfers Able to transfer safely, definite need of hands     Standing Unsupported with Eyes Closed Able to stand 3 seconds     Standing Unsupported with Feet Together Needs help to attain position but able to stand for 30 seconds with feet together     From Standing, Reach Forward with Outstretched Arm Can reach forward >5 cm safely (2")     From Standing Position, Pick up Object from Floor Unable to pick up and needs supervision     From Standing Position, Turn to Look Behind Over each Shoulder Needs assist to keep from losing balance and falling     Turn 360 Degrees Needs assistance while turning     Standing Unsupported, Alternately Place Feet on Step/Stool Needs assistance to keep from falling or unable to try     Standing Unsupported, One Foot in Front Needs help to step but can hold 15 seconds     Standing on One Leg Unable to try or needs assist to prevent fall     Total Score 21                GAIT ASSESSMENT: 03/18/2022:  FWW into clinic c mod independent, decreased gait speed.  SPC use in Rt UE c CGA 33 ft today c cues for sequencing.    TODAY'S TREATMENT:  05/31/2022: Therex: Recumbent bike Lvl 1 6 mins Leg press - double leg 75 lbs 2 x 12, single leg 37 lbs 2 x 12 bilateral  Standing Green band TKE Lt leg 5 sec hold x 12  Seated SLR 2 x 5 bilateral c cues for home use.    05/27/2022: Therex: Recumbent bike Lv1 2 - 5 mins, lvl 1 5 mins Leg press Double leg  2 x 10 75 lbs   , single leg 37 lbs 2 x 12 bilateral Step over 4 inch step c weight shift fwd/back (BIG inspired stepping) x 10 bilateral c single hand rail use and CGA Seated LAQ 2 x 15 bilateral 2.5 lbs  Sit to stand to sit c CGA to occasional min A , no UE use  18 inch chair x 5  Additional time required to perform activity to allow proper resting to prevent fatigue  as well as encouragement for slow control movement focus.     PATIENT EDUCATION:  05/06/2022 Education details: HEP update Person educated: Patient Education method: Consulting civil engineer, Demonstration, Verbal cues, and Handouts Education comprehension: verbalized understanding, returned demonstration, and verbal cues required     HOME EXERCISE PROGRAM: Access Code: [email or web address] URL: [email or web address] Date: 05/06/2022 Prepared by: Scot Jun  Exercises - Seated March  - 1-2 x daily - 7 x weekly - 1-2 sets - 10 reps - Sit to Stand  - 2-3 x daily - 7 x weekly - 1 sets - 5-10 reps - Seated Long Arc Quad  - 2-3 x daily - 7 x weekly - 1-2 sets - 10 reps - 2 hold - Standing Scapular Retraction  - 5 x daily - 7 x weekly - 1 sets - 5 reps -  5 second hold - Heel Toe Raises with Counter Support  - 1-2 x daily - 7 x weekly - 1-2 sets - 10 reps - Standing Tandem Balance with Counter Support  - 1-2 x daily - 7 x weekly - 1 sets - 10 reps - Seated Straight Leg Heel Taps  - 1-2 x daily - 7 x weekly - 1-2 sets - 5-10 reps - Seated Quad Set (Mirrored)  - 1-2 x daily - 7 x weekly - 1 sets - 10 reps - 5 hold - Small Range Straight Leg Raise  - 1-2 x daily - 7 x weekly - 1-2 sets - 10-15 reps   ASSESSMENT:   CLINICAL IMPRESSION:  Shortened intervention time today to allow travel to next MD.  Left leg strength , specifically quad strength still shows large impact on control in daily activity due to weakness. Medical necessity for continued skilled PT services indicated to progress strength and control to reduce fall risk and improve gait speed.    OBJECTIVE IMPAIRMENTS Abnormal gait, decreased activity tolerance, decreased balance, decreased endurance, decreased mobility, difficulty walking, decreased strength, impaired perceived functional ability, impaired flexibility, improper body mechanics, postural dysfunction, and pain.    ACTIVITY LIMITATIONS carrying, lifting, bending, standing,  squatting, stairs, transfers, bed mobility, reach over head, and locomotion level   PARTICIPATION LIMITATIONS: meal prep, cleaning, interpersonal relationship, driving, shopping, and community activity   PERSONAL FACTORS   History of 10/19/21 C3-4 C4-5 C5-6 ACDF with soft collar PMH bracial neuritis, abnormal gait, Degeneration of lumbar, HTN, gouty arthropathy, OA, lumbar laminectomy 2011, Rt TKA  are also affecting patient's functional outcome.    REHAB POTENTIAL: Good   CLINICAL DECISION MAKING: Stable/uncomplicated   EVALUATION COMPLEXITY: Low     GOALS: Goals reviewed with patient? Yes   Short term PT Goals (target date for Short term goals are 4 weeks 06/23/22) Patient will demonstrate independent use of home exercise program to maintain progress from in clinic treatments. Goal status: MET   Long term PT goals (target dates for all long term goals are 10 weeks  07/21/2022 )   1. Patient will demonstrate/report pain at worst less than or equal to 2/10 to facilitate minimal limitation in daily activity secondary to pain symptoms. Goal status: MET 04/05/22   2. Patient will demonstrate independent use of home exercise program to facilitate ability to maintain/progress functional gains from skilled physical therapy services. Goal status: MET 05/24/22   3. Patient will demonstrate bilateral hip MMT 5/5 throughout to facilitate improved transfers, ambulation towards independence.  Goal status:on going assessed 04/05/22   4.  Patient will demonstrate BERG testing > or = 45 to indicate reduced fall risk.  Goal status: on going assessed, score of 32 on 05/24/22   5.  Patient will demonstrate TUG c LRAD < or = 20 seconds to indicate reduced fall risk/ improved community ambulation.    Goal status: on going, now 36 seconds 05/24/22   6.  Patient will demonstrate ability to ambulation c LRAD (SPC, independent) community distances > 300 ft.   Goal status: on going, made it 160 feet with RW  on 05/24/22       PLAN: PT FREQUENCY: 1-2x/week   PT DURATION: 6 more  weeks from recert   PLANNED INTERVENTIONS: Therapeutic exercises, Therapeutic activity, Neuro Muscular re-education, Balance training, Gait training, Patient/Family education, Joint mobilization, Stair training, DME instructions, Dry Needling, Electrical stimulation, Cryotherapy, Moist heat, Taping, Ultrasound, Ionotophoresis 23m/ml Dexamethasone, and Manual therapy.  All included  unless contraindicated   PLAN FOR NEXT SESSION: SPC use in clinic as able.  Continue strengthening plan and movement coordination control.    Scot Jun, PT, DPT, OCS, ATC 05/31/22  1:29 PM

## 2022-05-31 NOTE — Progress Notes (Signed)
After obtaining consent, and per orders of Dr. Jones, injection of B12 was given in the left deltoid by Jhalen Eley P Oluwademilade Kellett. Patient instructed to report any adverse reaction to me immediately.  

## 2022-06-03 ENCOUNTER — Encounter: Payer: Self-pay | Admitting: Rehabilitative and Restorative Service Providers"

## 2022-06-03 ENCOUNTER — Ambulatory Visit (INDEPENDENT_AMBULATORY_CARE_PROVIDER_SITE_OTHER): Payer: PPO | Admitting: Rehabilitative and Restorative Service Providers"

## 2022-06-03 DIAGNOSIS — R2689 Other abnormalities of gait and mobility: Secondary | ICD-10-CM | POA: Diagnosis not present

## 2022-06-03 DIAGNOSIS — M6281 Muscle weakness (generalized): Secondary | ICD-10-CM

## 2022-06-03 DIAGNOSIS — M542 Cervicalgia: Secondary | ICD-10-CM

## 2022-06-03 DIAGNOSIS — R293 Abnormal posture: Secondary | ICD-10-CM | POA: Diagnosis not present

## 2022-06-03 NOTE — Therapy (Signed)
OUTPATIENT PHYSICAL THERAPY TREATMENT NOTE    Patient Name: Claudia [name] MRN: [id number] DOB:[birth year], 88 y.o., female Today's Date: 06/03/2022   END OF SESSION:   PT End of Session - 06/03/22 1605     Visit Number 27    Number of Visits 40    Date for PT Re-Evaluation 07/19/22    Authorization Type [employer] $15 copay    Progress Note Due on Visit 34   did last one at #14   PT Start Time 1558    PT Stop Time 1637    PT Time Calculation (min) 39 min    Equipment Utilized During Treatment Gait belt    Activity Tolerance Patient tolerated treatment well;Patient limited by fatigue    Behavior During Therapy WFL for tasks assessed/performed                 Past Medical History:  Diagnosis Date   Abnormality of gait    Brachial neuritis or radiculitis NOS    Degeneration of lumbar or lumbosacral intervertebral disc    Dysrhythmia    Essential hypertension, benign    Gouty arthropathy    Lumbago    Obesity    Osteoarthritis    Pure hypercholesterolemia    Unspecified hypothyroidism    Past Surgical History:  Procedure Laterality Date   ABDOMINAL HYSTERECTOMY  03/13/2010   ANTERIOR CERVICAL DECOMP/DISCECTOMY FUSION N/A 10/19/2021   Procedure: C3-4, C4-5, C5-6 ANTERIOR CERVICAL DISCECTOMY FUSION, ALLOGRAFT, PLATE;  Surgeon: [name], MD;  Location: [facility];  Service: Orthopedics;  Laterality: N/A;   BACK SURGERY     LUMBAR LAMINECTOMY  03/13/2010   right knee replacement  03/13/2010   Patient Active Problem List   Diagnosis Date Noted   Deficiency anemia 02/15/2022   Vitamin B12 deficiency neuropathy ([place]) 02/15/2022   Hypercalcemia 02/15/2022   S/P cervical spinal fusion 11/10/2021   Cervical stricture or stenosis 10/20/2021   Cervical spinal stenosis 10/19/2021   Meningioma ([place]) 08/24/2021   PAF (paroxysmal atrial fibrillation) ([place]) 11/03/2020   Stage 3b chronic kidney disease ([place]) 05/06/2020   Chronic left-sided low back pain without sciatica  12/25/2018   Spinal stenosis in cervical region 05/12/2016   Therapeutic opioid-induced constipation (OIC) 01/01/2016   Chronic idiopathic constipation 09/11/2013   Lumbosacral spondylosis without myelopathy 04/09/2013   Postlaminectomy syndrome, lumbar region 04/09/2013   Anticoagulation management encounter 02/01/2012   Neuropathy, peripheral 08/04/2011   Pure hypercholesterolemia 08/04/2011   Essential hypertension, benign 08/04/2011   Hypothyroidism 08/04/2011   DJD (degenerative joint disease) of knee 08/04/2011   Gout 08/04/2011      THERAPY DIAG:  Cervicalgia  Other abnormalities of gait and mobility  Muscle weakness (generalized)  Abnormal posture  PCP: Janith Lima. MD   REFERRING PROVIDER: [name], MD   REFERRING DIAG: Z98.1 (ICD-10-CM) - S/P cervical spinal fusion    Rationale for Evaluation and Treatment Rehabilitation   ONSET DATE: Dec 2022   SUBJECTIVE:  SUBJECTIVE STATEMENT: Pt indicated she has been doing well.  She reported doing her stuff at home.  No pain to report.    PERTINENT HISTORY:  History of 10/19/21 C3-4 C4-5 C5-6 ACDF with soft collar PMH bracial neuritis, abnormal gait, Degeneration of lumbar, HTN, gouty arthropathy, OA, lumbar laminectomy 2011, Rt TKA   PAIN:  No pain upon arrival.    PRECAUTIONS: None   WEIGHT BEARING RESTRICTIONS No   FALLS:  Has patient fallen in last 6 months? Maybe 1 fall in last 6 months.  Several falls within last year.    LIVING ENVIRONMENT: Lives with:lives alone Lives in: House/apartment Stairs: 3 steps to enter deck, rail on Lt going up.  No stairs in house.  Has following equipment at home: FWW, cane   OCCUPATION: retired    PLOF: Independent  , cooking/cleaning, shopping/grocery store    [place] stronger, walking independently.     OBJECTIVE:    PATIENT SURVEYS:  01/25/2022 No foto - incorrect set up    COGNITION: 01/25/2022 Overall cognitive status: Within functional limits for tasks assessed     SENSATION: 01/25/2022 not tested today   POSTURE:  01/25/2022 rounded shoulders, forward head, increased thoracic kyphosis, and flexed trunk    PALPATION: 01/25/2022 no specific tenderness noted in screening for balance.       MMT:            01/25/2022:  Seated resisted static testing hip abduction Rt: strong/painless. Lt moderate strength, painless (held MMT due to difficulty in bed mobility positioning MMT Right 01/25/2022 Left 01/25/2022 Left 02/22/2022 Left 03/11/2022 Left 03/16/22 Left 04/01/2022 Left 04/05/22 Left/Right 05/24/22  Shoulder flexion            Shoulder extension            Shoulder abduction            Shoulder adduction            Shoulder extension            Shoulder internal rotation            Shoulder external rotation                                      Hip flexion 5/5 4/5 4/5 4/5 4/5 4+/5 4+ 4+/4+  Hip extension            Hip abduction            Knee flexion 5/5 5/5        Knee extension  5/5 5/5    4+/5 4+ 4+/4+  Ankle DF 5/5 4/5  4/5 4+/5      (Blank rows = not tested)   CERVICAL SPECIAL TESTS:  01/25/2022 none performed   FUNCTIONAL TESTS:  06/03/2022:  TUG c FWW:  34.95 seconds, 32.84 seconds  05/24/22: TUG c FWW:  36 seconds  05/06/2022: TUG c FWW:  37 seconds at end of session  04/05/22 TUG 38.5s seconds with FWW 03/22/22: TUG 44 seconds C FWW (done at very end of session) 03/08/2022:   TUG:  41.76 seconds c FWW 01/25/2022        TUG:   71 seconds c FWW           [facility] PT Assessment - 01/25/22 0001  Standardized Balance Assessment    Standardized Balance Assessment Berg Balance Test          Berg Balance Test    Sit to Stand Able to stand using hands after several tries     Standing  Unsupported Able to stand 2 minutes with supervision     Sitting with Back Unsupported but Feet Supported on Floor or Stool Able to sit safely and securely 2 minutes     Stand to Sit Uses backs of legs against chair to control descent     Transfers Able to transfer safely, definite need of hands     Standing Unsupported with Eyes Closed Able to stand 3 seconds     Standing Unsupported with Feet Together Needs help to attain position but able to stand for 30 seconds with feet together     From Standing, Reach Forward with Outstretched Arm Can reach forward >5 cm safely (2")     From Standing Position, Pick up Object from Floor Unable to pick up and needs supervision     From Standing Position, Turn to Look Behind Over each Shoulder Needs assist to keep from losing balance and falling     Turn 360 Degrees Needs assistance while turning     Standing Unsupported, Alternately Place Feet on Step/Stool Needs assistance to keep from falling or unable to try     Standing Unsupported, One Foot in Front Needs help to step but can hold 15 seconds     Standing on One Leg Unable to try or needs assist to prevent fall     Total Score 21                GAIT ASSESSMENT: 03/18/2022:  FWW into clinic c mod independent, decreased gait speed.  SPC use in Rt UE c CGA 33 ft today c cues for sequencing.    TODAY'S TREATMENT:  06/03/2022: Therex: Recumbent bike Lvl 1-2 10 mins  TUG c FWW x 2 Leg press - double leg 75 lbs x15, single leg 37 lbs 2 x 12 bilateral with clinician guarding of hyperextension Seated LAQ bilateral 3 lbs 2 x 15 bilateral  Neuro Re-ed Alt 4 inch step toe tapping c CGA and single hand on bar anteriorly   TherActivity: Ambulation in clinic between activities c SPC use in Rt UE, gait belt c CGA.   Distances of 30 ft, 20 ft., 60 ft Fwd/back stepping 5 ft to simulate bathroom movements.   05/31/2022: Therex: Recumbent bike Lvl 1 6 mins Leg press - double leg 75 lbs 2 x 12, single leg 37  lbs 2 x 12 bilateral  Standing Green band TKE Lt leg 5 sec hold x 12  Seated SLR 2 x 5 bilateral c cues for home use.    05/27/2022: Therex: Recumbent bike Lv1 2 - 5 mins, lvl 1 5 mins Leg press Double leg  2 x 10 75 lbs   , single leg 37 lbs 2 x 12 bilateral Step over 4 inch step c weight shift fwd/back (BIG inspired stepping) x 10 bilateral c single hand rail use and CGA Seated LAQ 2 x 15 bilateral 2.5 lbs  Sit to stand to sit c CGA to occasional min A , no UE use  18 inch chair x 5  Additional time required to perform activity to allow proper resting to prevent fatigue as well as encouragement for slow control movement focus.     PATIENT EDUCATION:  05/06/2022 Education details: HEP update  Person educated: Patient Education method: Explanation, Demonstration, Verbal cues, and Handouts Education comprehension: verbalized understanding, returned demonstration, and verbal cues required     HOME EXERCISE PROGRAM: Access Code: [email or web address] URL: [email or web address] Date: 05/06/2022 Prepared by: Scot Jun  Exercises - Seated March  - 1-2 x daily - 7 x weekly - 1-2 sets - 10 reps - Sit to Stand  - 2-3 x daily - 7 x weekly - 1 sets - 5-10 reps - Seated Long Arc Quad  - 2-3 x daily - 7 x weekly - 1-2 sets - 10 reps - 2 hold - Standing Scapular Retraction  - 5 x daily - 7 x weekly - 1 sets - 5 reps - 5 second hold - Heel Toe Raises with Counter Support  - 1-2 x daily - 7 x weekly - 1-2 sets - 10 reps - Standing Tandem Balance with Counter Support  - 1-2 x daily - 7 x weekly - 1 sets - 10 reps - Seated Straight Leg Heel Taps  - 1-2 x daily - 7 x weekly - 1-2 sets - 5-10 reps - Seated Quad Set (Mirrored)  - 1-2 x daily - 7 x weekly - 1 sets - 10 reps - 5 hold - Small Range Straight Leg Raise  - 1-2 x daily - 7 x weekly - 1-2 sets - 10-15 reps   ASSESSMENT:   CLINICAL IMPRESSION:  Continued improved tolerance and performance on strengthening intervention.  Ambulation  c SPC still requires CGA but showing some mild improvements in quality.  Step lengths still reduced impacting gait speed c SPC and FWW.    OBJECTIVE IMPAIRMENTS Abnormal gait, decreased activity tolerance, decreased balance, decreased endurance, decreased mobility, difficulty walking, decreased strength, impaired perceived functional ability, impaired flexibility, improper body mechanics, postural dysfunction, and pain.    ACTIVITY LIMITATIONS carrying, lifting, bending, standing, squatting, stairs, transfers, bed mobility, reach over head, and locomotion level   PARTICIPATION LIMITATIONS: meal prep, cleaning, interpersonal relationship, driving, shopping, and community activity   PERSONAL FACTORS   History of 10/19/21 C3-4 C4-5 C5-6 ACDF with soft collar PMH bracial neuritis, abnormal gait, Degeneration of lumbar, HTN, gouty arthropathy, OA, lumbar laminectomy 2011, Rt TKA  are also affecting patient's functional outcome.    REHAB POTENTIAL: Good   CLINICAL DECISION MAKING: Stable/uncomplicated   EVALUATION COMPLEXITY: Low     GOALS: Goals reviewed with patient? Yes   Short term PT Goals (target date for Short term goals are 4 weeks 06/23/22) Patient will demonstrate independent use of home exercise program to maintain progress from in clinic treatments. Goal status: MET   Long term PT goals (target dates for all long term goals are 10 weeks  07/21/2022 )   1. Patient will demonstrate/report pain at worst less than or equal to 2/10 to facilitate minimal limitation in daily activity secondary to pain symptoms. Goal status: MET 04/05/22   2. Patient will demonstrate independent use of home exercise program to facilitate ability to maintain/progress functional gains from skilled physical therapy services. Goal status: MET 05/24/22   3. Patient will demonstrate bilateral hip MMT 5/5 throughout to facilitate improved transfers, ambulation towards independence.  Goal status:on going assessed  04/05/22   4.  Patient will demonstrate BERG testing > or = 45 to indicate reduced fall risk.  Goal status: on going assessed, score of 32 on 05/24/22   5.  Patient will demonstrate TUG c LRAD < or = 20 seconds to indicate reduced fall risk/ improved community ambulation.  Goal status: on going, now 36 seconds 05/24/22   6.  Patient will demonstrate ability to ambulation c LRAD (SPC, independent) community distances > 300 ft.   Goal status: on going, made it 160 feet with RW on 05/24/22       PLAN: PT FREQUENCY: 1-2x/week   PT DURATION: 6 more  weeks from recert   PLANNED INTERVENTIONS: Therapeutic exercises, Therapeutic activity, Neuro Muscular re-education, Balance training, Gait training, Patient/Family education, Joint mobilization, Stair training, DME instructions, Dry Needling, Electrical stimulation, Cryotherapy, Moist heat, Taping, Ultrasound, Ionotophoresis 85m/ml Dexamethasone, and Manual therapy.  All included unless contraindicated   PLAN FOR NEXT SESSION: SPC use in clinic as able.  Strengthening, balance, dynamic balance.     MScot Jun PT, DPT, OCS, ATC 06/03/22  4:39 PM

## 2022-06-10 ENCOUNTER — Encounter: Payer: PPO | Admitting: Physical Therapy

## 2022-06-11 ENCOUNTER — Other Ambulatory Visit: Payer: Self-pay | Admitting: Internal Medicine

## 2022-06-11 DIAGNOSIS — I10 Essential (primary) hypertension: Secondary | ICD-10-CM

## 2022-06-11 DIAGNOSIS — M961 Postlaminectomy syndrome, not elsewhere classified: Secondary | ICD-10-CM

## 2022-06-11 DIAGNOSIS — M170 Bilateral primary osteoarthritis of knee: Secondary | ICD-10-CM

## 2022-06-11 DIAGNOSIS — M47817 Spondylosis without myelopathy or radiculopathy, lumbosacral region: Secondary | ICD-10-CM

## 2022-06-14 ENCOUNTER — Ambulatory Visit (INDEPENDENT_AMBULATORY_CARE_PROVIDER_SITE_OTHER): Payer: PPO | Admitting: Internal Medicine

## 2022-06-14 ENCOUNTER — Encounter: Payer: Self-pay | Admitting: Internal Medicine

## 2022-06-14 VITALS — BP 152/78 | HR 57 | Temp 98.1°F | Resp 16 | Ht 70.0 in | Wt 165.0 lb

## 2022-06-14 DIAGNOSIS — N1832 Chronic kidney disease, stage 3b: Secondary | ICD-10-CM | POA: Diagnosis not present

## 2022-06-14 DIAGNOSIS — E039 Hypothyroidism, unspecified: Secondary | ICD-10-CM

## 2022-06-14 DIAGNOSIS — I480 Paroxysmal atrial fibrillation: Secondary | ICD-10-CM

## 2022-06-14 DIAGNOSIS — I10 Essential (primary) hypertension: Secondary | ICD-10-CM

## 2022-06-14 DIAGNOSIS — Z0001 Encounter for general adult medical examination with abnormal findings: Secondary | ICD-10-CM | POA: Diagnosis not present

## 2022-06-14 DIAGNOSIS — E559 Vitamin D deficiency, unspecified: Secondary | ICD-10-CM

## 2022-06-14 DIAGNOSIS — G63 Polyneuropathy in diseases classified elsewhere: Secondary | ICD-10-CM | POA: Diagnosis not present

## 2022-06-14 DIAGNOSIS — E538 Deficiency of other specified B group vitamins: Secondary | ICD-10-CM

## 2022-06-14 LAB — CBC WITH DIFFERENTIAL/PLATELET
Basophils Absolute: 0 10*3/uL (ref 0.0–0.1)
Basophils Relative: 0.9 % (ref 0.0–3.0)
Eosinophils Absolute: 0.1 10*3/uL (ref 0.0–0.7)
Eosinophils Relative: 3.3 % (ref 0.0–5.0)
HCT: 35.8 % — ABNORMAL LOW (ref 36.0–46.0)
Hemoglobin: 12 g/dL (ref 12.0–15.0)
Lymphocytes Relative: 41.2 % (ref 12.0–46.0)
Lymphs Abs: 1.8 10*3/uL (ref 0.7–4.0)
MCHC: 33.5 g/dL (ref 30.0–36.0)
MCV: 87 fl (ref 78.0–100.0)
Monocytes Absolute: 0.4 10*3/uL (ref 0.1–1.0)
Monocytes Relative: 10.1 % (ref 3.0–12.0)
Neutro Abs: 2 10*3/uL (ref 1.4–7.7)
Neutrophils Relative %: 44.5 % (ref 43.0–77.0)
Platelets: 215 10*3/uL (ref 150.0–400.0)
RBC: 4.11 Mil/uL (ref 3.87–5.11)
RDW: 13.9 % (ref 11.5–15.5)
WBC: 4.4 10*3/uL (ref 4.0–10.5)

## 2022-06-14 LAB — BASIC METABOLIC PANEL
BUN: 23 mg/dL (ref 6–23)
CO2: 28 mEq/L (ref 19–32)
Calcium: 10.2 mg/dL (ref 8.4–10.5)
Chloride: 106 mEq/L (ref 96–112)
Creatinine, Ser: 1.05 mg/dL (ref 0.40–1.20)
GFR: 48.18 mL/min — ABNORMAL LOW (ref >60.00)
Glucose, Bld: 99 mg/dL (ref 70–99)
Potassium: 4.3 mEq/L (ref 3.5–5.1)
Sodium: 142 mEq/L (ref 135–145)

## 2022-06-14 LAB — FOLATE: Folate: 9.4 ng/mL (ref >5.9)

## 2022-06-14 LAB — VITAMIN D 25 HYDROXY (VIT D DEFICIENCY, FRACTURES): VITD: 12.17 ng/mL — ABNORMAL LOW (ref 30.00–100.00)

## 2022-06-14 LAB — TSH: TSH: 3.94 u[IU]/mL (ref 0.35–5.50)

## 2022-06-14 NOTE — Patient Instructions (Signed)

## 2022-06-14 NOTE — Progress Notes (Signed)
Subjective:  Patient ID: Claudia [name], female    DOB: Jan 11, 1934  Age: 88 y.o. MRN: [id number]  CC: Annual Exam and Hypertension   HPI Claudia [name] presents for a CPX and f/up -  She denies chest pain, shortness of breath, diaphoresis, or edema.  Outpatient Medications Prior to Visit  Medication Sig Dispense Refill   Rivaroxaban (XARELTO) 15 MG TABS tablet Take 1 tablet (15 mg total) by mouth daily with supper. Dose decrease 30 tablet 2   ABRYSVO 120 MCG/0.5ML injection      COMIRNATY syringe      diltiazem (CARDIZEM) 30 MG tablet TAKE ONE TABLET EVERY 4 HOURS AS NEEDED FOR afib heart rate greater THAN 100 aslong as blood pressure less THAN 100 90 tablet 1   HYDROcodone-acetaminophen (NORCO/VICODIN) 5-325 MG tablet TAKE ONE TABLET BY MOUTH EVERY 6 HOURS AS NEEDED FOR PAIN 90 tablet 0   linaclotide (LINZESS) 290 MCG CAPS capsule Take 1 capsule (290 mcg total) by mouth daily before breakfast. 90 capsule 1   torsemide (DEMADEX) 20 MG tablet TAKE ONE TABLET BY MOUTH ONCE DAILY 90 tablet 1   vitamin C (ASCORBIC ACID) 500 MG tablet Take 500 mg by mouth daily.     vitamin E 200 UNIT capsule Take 200 Units by mouth daily.     Facility-Administered Medications Prior to Visit  Medication Dose Route Frequency Provider Last Rate Last Admin   cyanocobalamin (VITAMIN B12) injection 1,000 mcg  1,000 mcg Intramuscular Q30 days Hoyt Koch, MD   1,000 mcg at 02/22/22 1414    ROS Review of Systems  Constitutional:  Negative for appetite change, diaphoresis, fatigue and unexpected weight change.  HENT: Negative.    Eyes: Negative.   Respiratory:  Negative for cough, chest tightness, shortness of breath and wheezing.   Cardiovascular:  Negative for chest pain, palpitations and leg swelling.  Gastrointestinal: Negative.  Negative for abdominal pain, constipation, diarrhea, nausea and vomiting.  Endocrine: Negative.   Genitourinary: Negative.  Negative for difficulty urinating.   Musculoskeletal:  Positive for arthralgias, back pain and neck pain. Negative for joint swelling.  Skin: Negative.   Neurological: Negative.  Negative for dizziness, weakness and numbness.  Hematological:  Negative for adenopathy. Does not bruise/bleed easily.  Psychiatric/Behavioral:  Positive for confusion and decreased concentration.     Objective:  BP (!) 152/78 (BP Location: Left Arm, Patient Position: Sitting, Cuff Size: Normal)   Pulse (!) 57   Temp 98.1 F (36.7 C) (Oral)   Resp 16   Ht '5\' 10"'$  (1.778 m)   Wt 165 lb (74.8 kg)   SpO2 97%   BMI 23.68 kg/m   BP Readings from Last 3 Encounters:  06/14/22 (!) 152/78  02/15/22 (!) 156/70  01/05/22 (!) 164/71    Wt Readings from Last 3 Encounters:  06/14/22 165 lb (74.8 kg)  02/15/22 159 lb (72.1 kg)  01/05/22 177 lb (80.3 kg)    Physical Exam Vitals reviewed.  HENT:     Mouth/Throat:     Mouth: Mucous membranes are moist.  Eyes:     General: No scleral icterus.    Conjunctiva/sclera: Conjunctivae normal.  Cardiovascular:     Rate and Rhythm: Normal rate and regular rhythm.     Heart sounds: No murmur heard. Pulmonary:     Breath sounds: No stridor. No wheezing, rhonchi or rales.  Abdominal:     General: Abdomen is flat.     Palpations: There is no mass.  Tenderness: There is no abdominal tenderness. There is no guarding.     Hernia: No hernia is present.  Musculoskeletal:        General: Normal range of motion.     Cervical back: Neck supple.     Right lower leg: No edema.     Left lower leg: No edema.  Lymphadenopathy:     Cervical: No cervical adenopathy.  Skin:    General: Skin is warm and dry.  Neurological:     General: No focal deficit present.     Mental Status: She is alert.  Psychiatric:        Mood and Affect: Mood normal.        Behavior: Behavior normal.     Lab Results  Component Value Date   WBC 4.4 06/14/2022   HGB 12.0 06/14/2022   HCT 35.8 (L) 06/14/2022   PLT 215.0  06/14/2022   GLUCOSE 99 06/14/2022   CHOL 148 05/05/2021   TRIG 85.0 05/05/2021   HDL 54.40 05/05/2021   LDLDIRECT 158.9 09/05/2012   LDLCALC 77 05/05/2021   ALT 11 10/19/2021   AST 14 (L) 10/19/2021   NA 142 06/14/2022   K 4.3 06/14/2022   CL 106 06/14/2022   CREATININE 1.05 06/14/2022   BUN 23 06/14/2022   CO2 28 06/14/2022   TSH 3.94 06/14/2022   INR 1.00 04/04/2017   HGBA1C 5.5 11/03/2020    DG Cervical Spine 2 or 3 views  Result Date: 10/19/2021 CLINICAL DATA:  ACDF EXAM: CERVICAL SPINE - 2-3 VIEW COMPARISON:  Radiograph 07/28/2021 FINDINGS: Intraoperative images during C3-C4, C4-C5, and C5-C6 ACDF. Intact hardware without evidence of immediate complication. IMPRESSION: Intraoperative images during C3-C6 ACDF. No evidence of immediate complication. Electronically Signed   By: Maurine Simmering M.D.   On: 10/19/2021 11:27   DG C-Arm 1-60 Min-No Report  Result Date: 10/19/2021 Fluoroscopy was utilized by the requesting physician.  No radiographic interpretation.   DG C-Arm 1-60 Min-No Report  Result Date: 10/19/2021 Fluoroscopy was utilized by the requesting physician.  No radiographic interpretation.   DG C-Arm 1-60 Min-No Report  Result Date: 10/19/2021 Fluoroscopy was utilized by the requesting physician.  No radiographic interpretation.    Assessment & Plan:   [name] was seen today for annual exam and hypertension.  Diagnoses and all orders for this visit:  Vitamin B12 deficiency neuropathy ([place])- He is doing well on parenteral B12 replacement therapy. -     CBC with Differential/Platelet; Future -     Folate; Future -     Folate -     CBC with Differential/Platelet  Essential hypertension, benign- Her blood pressure is well-controlled. -     Basic metabolic panel; Future -     TSH; Future -     TSH -     Basic metabolic panel  PAF (paroxysmal atrial fibrillation) ([place])- She has good rate and rhythm control. -     TSH; Future -     TSH  Acquired  hypothyroidism- She is euthyroid. -     TSH; Future -     TSH  Stage 3b chronic kidney disease ([place])- Her renal function is stable. -     Basic metabolic panel; Future -     Basic metabolic panel  Hypercalcemia- I think this is caused by vitamin D deficiency. -     Basic metabolic panel; Future -     VITAMIN D 25 Hydroxy (Vit-D Deficiency, Fractures); Future -     PTH,  intact and calcium; Future -     PTH, intact and calcium -     VITAMIN D 25 Hydroxy (Vit-D Deficiency, Fractures) -     Basic metabolic panel  Vitamin D deficiency disease -     Cholecalciferol 50 MCG (2000 UT) TABS; Take 1 tablet (2,000 Units total) by mouth daily.  Encounter for general adult medical examination with abnormal findings- Exam completed, labs reviewed, vaccines reviewed and updated, no cancer screenings indicated.   I have discontinued Myiesha Edgar. Kho's vitamin E, Comirnaty, and Abrysvo. I am also having her start on Cholecalciferol. Additionally, I am having her maintain her ascorbic acid, diltiazem, linaclotide, Rivaroxaban, torsemide, and HYDROcodone-acetaminophen. We will continue to administer cyanocobalamin.  Meds ordered this encounter  Medications   Cholecalciferol 50 MCG (2000 UT) TABS    Sig: Take 1 tablet (2,000 Units total) by mouth daily.    Dispense:  90 tablet    Refill:  0     Follow-up: Return in about 6 months (around 12/14/2022).  [name], MD

## 2022-06-15 MED ORDER — CHOLECALCIFEROL 50 MCG (2000 UT) PO TABS: 1.0000 | ORAL_TABLET | Freq: Every day | ORAL | 0 refills | Status: DC

## 2022-06-16 LAB — PTH, INTACT AND CALCIUM
Calcium: 10 mg/dL (ref 8.6–10.4)
PTH: 70 pg/mL (ref 16–77)

## 2022-06-17 ENCOUNTER — Encounter: Payer: PPO | Admitting: Rehabilitative and Restorative Service Providers"

## 2022-06-21 DIAGNOSIS — E559 Vitamin D deficiency, unspecified: Secondary | ICD-10-CM | POA: Insufficient documentation

## 2022-06-21 DIAGNOSIS — Z0001 Encounter for general adult medical examination with abnormal findings: Secondary | ICD-10-CM | POA: Insufficient documentation

## 2022-06-24 ENCOUNTER — Ambulatory Visit (INDEPENDENT_AMBULATORY_CARE_PROVIDER_SITE_OTHER): Payer: PPO | Admitting: Rehabilitative and Restorative Service Providers"

## 2022-06-24 ENCOUNTER — Encounter: Payer: Self-pay | Admitting: Rehabilitative and Restorative Service Providers"

## 2022-06-24 DIAGNOSIS — M47817 Spondylosis without myelopathy or radiculopathy, lumbosacral region: Secondary | ICD-10-CM | POA: Diagnosis not present

## 2022-06-24 DIAGNOSIS — R2689 Other abnormalities of gait and mobility: Secondary | ICD-10-CM

## 2022-06-24 DIAGNOSIS — M542 Cervicalgia: Secondary | ICD-10-CM

## 2022-06-24 DIAGNOSIS — M6281 Muscle weakness (generalized): Secondary | ICD-10-CM | POA: Diagnosis not present

## 2022-06-24 DIAGNOSIS — R293 Abnormal posture: Secondary | ICD-10-CM

## 2022-06-24 NOTE — Therapy (Signed)
OUTPATIENT PHYSICAL THERAPY TREATMENT NOTE    Patient Name: Claudia [name] MRN: [id number] DOB:[birth year], 88 y.o., female Today's Date: 06/24/2022   END OF SESSION:   PT End of Session - 06/24/22 1609     Visit Number 28    Number of Visits 40    Date for PT Re-Evaluation 07/19/22    Authorization Type [employer] $15 copay    Progress Note Due on Visit 34   did last one at #14   PT Start Time 1552    PT Stop Time 1634    PT Time Calculation (min) 42 min    Equipment Utilized During Treatment Gait belt    Activity Tolerance Patient tolerated treatment well    Behavior During Therapy WFL for tasks assessed/performed                  Past Medical History:  Diagnosis Date   Abnormality of gait    Brachial neuritis or radiculitis NOS    Degeneration of lumbar or lumbosacral intervertebral disc    Dysrhythmia    Essential hypertension, benign    Gouty arthropathy    Lumbago    Obesity    Osteoarthritis    Pure hypercholesterolemia    Unspecified hypothyroidism    Past Surgical History:  Procedure Laterality Date   ABDOMINAL HYSTERECTOMY  03/13/2010   ANTERIOR CERVICAL DECOMP/DISCECTOMY FUSION N/A 10/19/2021   Procedure: C3-4, C4-5, C5-6 ANTERIOR CERVICAL DISCECTOMY FUSION, ALLOGRAFT, PLATE;  Surgeon: [name], MD;  Location: [facility];  Service: Orthopedics;  Laterality: N/A;   BACK SURGERY     LUMBAR LAMINECTOMY  03/13/2010   right knee replacement  03/13/2010   Patient Active Problem List   Diagnosis Date Noted   Vitamin D deficiency disease 06/21/2022   Encounter for general adult medical examination with abnormal findings 06/21/2022   Vitamin B12 deficiency neuropathy ([place]) 02/15/2022   Hypercalcemia 02/15/2022   Cervical stricture or stenosis 10/20/2021   Cervical spinal stenosis 10/19/2021   Meningioma ([place]) 08/24/2021   PAF (paroxysmal atrial fibrillation) ([place]) 11/03/2020   Stage 3b chronic kidney disease ([place]) 05/06/2020   Spinal stenosis in  cervical region 05/12/2016   Therapeutic opioid-induced constipation ([facility]) 01/01/2016   Chronic idiopathic constipation 09/11/2013   Lumbosacral spondylosis without myelopathy 04/09/2013   Postlaminectomy syndrome, lumbar region 04/09/2013   Anticoagulation management encounter 02/01/2012   Neuropathy, peripheral 08/04/2011   Pure hypercholesterolemia 08/04/2011   Essential hypertension, benign 08/04/2011   Hypothyroidism 08/04/2011   DJD (degenerative joint disease) of knee 08/04/2011   Gout 08/04/2011      THERAPY DIAG:  Cervicalgia  Other abnormalities of gait and mobility  Muscle weakness (generalized)  Abnormal posture  PCP: Janith Lima. MD   REFERRING PROVIDER: [name], MD   REFERRING DIAG: Z98.1 (ICD-10-CM) - S/P cervical spinal fusion    Rationale for Evaluation and Treatment Rehabilitation   ONSET DATE: Dec 2022   SUBJECTIVE:  SUBJECTIVE STATEMENT: She indicated she was able to driver herself.  Still uncertain about going up/down stairs alone.    PERTINENT HISTORY:  History of 10/19/21 C3-4 C4-5 C5-6 ACDF with soft collar PMH bracial neuritis, abnormal gait, Degeneration of lumbar, HTN, gouty arthropathy, OA, lumbar laminectomy 2011, Rt TKA   PAIN:  No pain upon arrival.    PRECAUTIONS: None   WEIGHT BEARING RESTRICTIONS No   FALLS:  Has patient fallen in last 6 months? Maybe 1 fall in last 6 months.  Several falls within last year.    LIVING ENVIRONMENT: Lives with:lives alone Lives in: House/apartment Stairs: 3 steps to enter deck, rail on Lt going up.  No stairs in house.  Has following equipment at home: FWW, cane   OCCUPATION: retired    PLOF: Independent  , cooking/cleaning, shopping/grocery store   [place] stronger,  walking independently.     OBJECTIVE:    PATIENT SURVEYS:  01/25/2022 No foto - incorrect set up    COGNITION: 01/25/2022 Overall cognitive status: Within functional limits for tasks assessed     SENSATION: 01/25/2022 not tested today   POSTURE:  01/25/2022 rounded shoulders, forward head, increased thoracic kyphosis, and flexed trunk    PALPATION: 01/25/2022 no specific tenderness noted in screening for balance.       MMT: 01/25/2022:  Seated resisted static testing hip abduction Rt: strong/painless. Lt moderate strength, painless (held MMT due to difficulty in bed mobility positioning MMT Right 01/25/2022 Left 01/25/2022 Left 02/22/2022 Left 03/11/2022 Left 03/16/22 Left 04/01/2022 Left 04/05/22 Left/Right 05/24/22  Shoulder flexion            Shoulder extension            Shoulder abduction            Shoulder adduction            Shoulder extension            Shoulder internal rotation            Shoulder external rotation                                      Hip flexion 5/5 4/5 4/5 4/5 4/5 4+/5 4+ 4+/4+  Hip extension            Hip abduction            Knee flexion 5/5 5/5        Knee extension  5/5 5/5    4+/5 4+ 4+/4+  Ankle DF 5/5 4/5  4/5 4+/5      (Blank rows = not tested)   CERVICAL SPECIAL TESTS:  01/25/2022 none performed   FUNCTIONAL TESTS:    06/24/22 0001  Berg Balance Test  Sit to Stand 3  Standing Unsupported 3  Sitting with Back Unsupported but Feet Supported on Floor or Stool 4  Stand to Sit 3  Transfers 3  Standing Unsupported with Eyes Closed 3  Standing Unsupported with Feet Together 3  From Standing, Reach Forward with Outstretched Arm 3  From Standing Position, Pick up Object from Floor 3  From Standing Position, Turn to Look Behind Over each Shoulder 2  Turn 360 Degrees 1  Standing Unsupported, Alternately Place Feet on Step/Stool 0  Standing Unsupported, One Foot in Front 2  Standing on One Leg 0  Total Score 33  06/03/2022:   TUG c FWW:  34.95 seconds, 32.84 seconds  05/24/22: TUG c FWW:  36 seconds  05/06/2022: TUG c FWW:  37 seconds at end of session  04/05/22 TUG 38.5s seconds with FWW 03/22/22: TUG 44 seconds C FWW (done at very end of session) 03/08/2022:   TUG:  41.76 seconds c FWW 01/25/2022        TUG:   71 seconds c FWW   [facility] PT Assessment - 06/24/22 0001       Berg Balance Test   Sit to Stand Able to stand  independently using hands    Standing Unsupported Able to stand 2 minutes with supervision    Sitting with Back Unsupported but Feet Supported on Floor or Stool Able to sit safely and securely 2 minutes    Stand to Sit Controls descent by using hands    Transfers Able to transfer safely, definite need of hands    Standing Unsupported with Eyes Closed Able to stand 10 seconds with supervision    Standing Unsupported with Feet Together Able to place feet together independently and stand for 1 minute with supervision    From Standing, Reach Forward with Outstretched Arm Can reach forward >12 cm safely (5")    From Standing Position, Pick up Object from Floor Able to pick up shoe, needs supervision    From Standing Position, Turn to Look Behind Over each Shoulder Turn sideways only but maintains balance    Turn 360 Degrees Needs close supervision or verbal cueing    Standing Unsupported, Alternately Place Feet on Step/Stool Needs assistance to keep from falling or unable to try    Standing Unsupported, One Foot in Front Able to take small step independently and hold 30 seconds    Standing on One Leg Unable to try or needs assist to prevent fall    Total Score 33               [facility] PT Assessment - 01/25/22 0001                Standardized Balance Assessment    Standardized Balance Assessment Berg Balance Test          Berg Balance Test    Sit to Stand Able to stand using hands after several tries     Standing Unsupported Able to stand 2 minutes with supervision     Sitting with Back  Unsupported but Feet Supported on Floor or Stool Able to sit safely and securely 2 minutes     Stand to Sit Uses backs of legs against chair to control descent     Transfers Able to transfer safely, definite need of hands     Standing Unsupported with Eyes Closed Able to stand 3 seconds     Standing Unsupported with Feet Together Needs help to attain position but able to stand for 30 seconds with feet together     From Standing, Reach Forward with Outstretched Arm Can reach forward >5 cm safely (2")     From Standing Position, Pick up Object from Floor Unable to pick up and needs supervision     From Standing Position, Turn to Look Behind Over each Shoulder Needs assist to keep from losing balance and falling     Turn 360 Degrees Needs assistance while turning     Standing Unsupported, Alternately Place Feet on Step/Stool Needs assistance to keep from falling or unable to try     Standing Unsupported, One Foot  in [place] help to step but can hold 15 seconds     Standing on One Leg Unable to try or needs assist to prevent fall     Total Score 21                GAIT ASSESSMENT: 03/18/2022:  FWW into clinic c mod independent, decreased gait speed.  SPC use in Rt UE c CGA 33 ft today c cues for sequencing.    TODAY'S TREATMENT:  06/24/2022: Therex: Recumbent bike Lvl 2 10 mins  Leg press - double leg 75 lbs 2 x15, single leg 37 lbs 2 x 12 bilateral with clinician guarding of hyperextension Seated LAQ bilateral 4 lbs 2 x 10 bilateral (Lt movement limited due to weight - 3 lbs next visit)  Physical Performance Testing Berg balance testing as listed above with time spent in performance, cues and education. Additional time required to perform due to fatigue, movement pattern speeds.   06/03/2022: Therex: Recumbent bike Lvl 1-2 10 mins  TUG c FWW x 2 Leg press - double leg 75 lbs x15, single leg 37 lbs 2 x 12 bilateral with clinician guarding of hyperextension Seated LAQ bilateral 3 lbs  2 x 15 bilateral  Neuro Re-ed Alt 4 inch step toe tapping c CGA and single hand on bar anteriorly   TherActivity: Ambulation in clinic between activities c SPC use in Rt UE, gait belt c CGA.   Distances of 30 ft, 20 ft., 60 ft Fwd/back stepping 5 ft to simulate bathroom movements.   05/31/2022: Therex: Recumbent bike Lvl 1 6 mins Leg press - double leg 75 lbs 2 x 12, single leg 37 lbs 2 x 12 bilateral  Standing Green band TKE Lt leg 5 sec hold x 12  Seated SLR 2 x 5 bilateral c cues for home use.     PATIENT EDUCATION:  05/06/2022 Education details: HEP update Person educated: Patient Education method: Consulting civil engineer, Demonstration, Verbal cues, and Handouts Education comprehension: verbalized understanding, returned demonstration, and verbal cues required     HOME EXERCISE PROGRAM: Access Code: [email or web address] URL: [email or web address] Date: 05/06/2022 Prepared by: Scot Jun  Exercises - Seated March  - 1-2 x daily - 7 x weekly - 1-2 sets - 10 reps - Sit to Stand  - 2-3 x daily - 7 x weekly - 1 sets - 5-10 reps - Seated Long Arc Quad  - 2-3 x daily - 7 x weekly - 1-2 sets - 10 reps - 2 hold - Standing Scapular Retraction  - 5 x daily - 7 x weekly - 1 sets - 5 reps - 5 second hold - Heel Toe Raises with Counter Support  - 1-2 x daily - 7 x weekly - 1-2 sets - 10 reps - Standing Tandem Balance with Counter Support  - 1-2 x daily - 7 x weekly - 1 sets - 10 reps - Seated Straight Leg Heel Taps  - 1-2 x daily - 7 x weekly - 1-2 sets - 5-10 reps - Seated Quad Set (Mirrored)  - 1-2 x daily - 7 x weekly - 1 sets - 10 reps - 5 hold - Small Range Straight Leg Raise  - 1-2 x daily - 7 x weekly - 1-2 sets - 10-15 reps   ASSESSMENT:   CLINICAL IMPRESSION:  Berg testing score progressive slowly but making gains.  Difficulty in WB on Lt leg and unsupported balance noted.  Pt expressed desire to improve ability to perform stairs to exit  house.  Will plan on work in future visits  as able.    OBJECTIVE IMPAIRMENTS Abnormal gait, decreased activity tolerance, decreased balance, decreased endurance, decreased mobility, difficulty walking, decreased strength, impaired perceived functional ability, impaired flexibility, improper body mechanics, postural dysfunction, and pain.    ACTIVITY LIMITATIONS carrying, lifting, bending, standing, squatting, stairs, transfers, bed mobility, reach over head, and locomotion level   PARTICIPATION LIMITATIONS: meal prep, cleaning, interpersonal relationship, driving, shopping, and community activity   PERSONAL FACTORS   History of 10/19/21 C3-4 C4-5 C5-6 ACDF with soft collar PMH bracial neuritis, abnormal gait, Degeneration of lumbar, HTN, gouty arthropathy, OA, lumbar laminectomy 2011, Rt TKA  are also affecting patient's functional outcome.    REHAB POTENTIAL: Good   CLINICAL DECISION MAKING: Stable/uncomplicated   EVALUATION COMPLEXITY: Low     GOALS: Goals reviewed with patient? Yes   Short term PT Goals (target date for Short term goals are 4 weeks 06/23/22) Patient will demonstrate independent use of home exercise program to maintain progress from in clinic treatments. Goal status: MET   Long term PT goals (target dates for all long term goals are 10 weeks  07/21/2022 )   1. Patient will demonstrate/report pain at worst less than or equal to 2/10 to facilitate minimal limitation in daily activity secondary to pain symptoms. Goal status: MET 04/05/22   2. Patient will demonstrate independent use of home exercise program to facilitate ability to maintain/progress functional gains from skilled physical therapy services. Goal status: MET 05/24/22   3. Patient will demonstrate bilateral hip MMT 5/5 throughout to facilitate improved transfers, ambulation towards independence.  Goal status:on going 06/24/2022   4.  Patient will demonstrate BERG testing > or = 45 to indicate reduced fall risk.  Goal status: on going 06/24/2022    5.  Patient will demonstrate TUG c LRAD < or = 20 seconds to indicate reduced fall risk/ improved community ambulation.    Goal status: on going 06/24/2022   6.  Patient will demonstrate ability to ambulation c LRAD (SPC, independent) community distances > 300 ft.   Goal status: on going 06/24/2022       PLAN: PT FREQUENCY: 1-2x/week   PT DURATION: 6 more  weeks from recert   PLANNED INTERVENTIONS: Therapeutic exercises, Therapeutic activity, Neuro Muscular re-education, Balance training, Gait training, Patient/Family education, Joint mobilization, Stair training, DME instructions, Dry Needling, Electrical stimulation, Cryotherapy, Moist heat, Taping, Ultrasound, Ionotophoresis 77m/ml Dexamethasone, and Manual therapy.  All included unless contraindicated   PLAN FOR NEXT SESSION:  Stair navigation training to mimic home with one handrail.    MScot Jun PT, DPT, OCS, ATC 06/24/22  4:34 PM

## 2022-07-01 ENCOUNTER — Encounter: Payer: Self-pay | Admitting: Physical Therapy

## 2022-07-01 ENCOUNTER — Ambulatory Visit (INDEPENDENT_AMBULATORY_CARE_PROVIDER_SITE_OTHER): Payer: PPO | Admitting: Physical Therapy

## 2022-07-01 DIAGNOSIS — G8929 Other chronic pain: Secondary | ICD-10-CM | POA: Diagnosis not present

## 2022-07-01 DIAGNOSIS — M542 Cervicalgia: Secondary | ICD-10-CM | POA: Diagnosis not present

## 2022-07-01 DIAGNOSIS — M545 Low back pain, unspecified: Secondary | ICD-10-CM

## 2022-07-01 DIAGNOSIS — R2689 Other abnormalities of gait and mobility: Secondary | ICD-10-CM

## 2022-07-01 DIAGNOSIS — R293 Abnormal posture: Secondary | ICD-10-CM | POA: Diagnosis not present

## 2022-07-01 DIAGNOSIS — M6281 Muscle weakness (generalized): Secondary | ICD-10-CM | POA: Diagnosis not present

## 2022-07-01 NOTE — Therapy (Signed)
OUTPATIENT PHYSICAL THERAPY TREATMENT NOTE    Patient Name: Claudia [name] MRN: [id number] DOB:06-06-1934, 89 y.o., female Today's Date: 07/01/2022   END OF SESSION:   PT End of Session - 07/01/22 1554     Visit Number 29    Number of Visits 40    Date for PT Re-Evaluation 07/19/22    Authorization Type [employer] $15 copay    Progress Note Due on Visit 34   did last one at #24   PT Start Time 1555    PT Stop Time 1640    PT Time Calculation (min) 45 min    Equipment Utilized During Treatment Gait belt    Activity Tolerance Patient tolerated treatment well    Behavior During Therapy WFL for tasks assessed/performed                  Past Medical History:  Diagnosis Date   Abnormality of gait    Brachial neuritis or radiculitis NOS    Degeneration of lumbar or lumbosacral intervertebral disc    Dysrhythmia    Essential hypertension, benign    Gouty arthropathy    Lumbago    Obesity    Osteoarthritis    Pure hypercholesterolemia    Unspecified hypothyroidism    Past Surgical History:  Procedure Laterality Date   ABDOMINAL HYSTERECTOMY  03/13/2010   ANTERIOR CERVICAL DECOMP/DISCECTOMY FUSION N/A 10/19/2021   Procedure: C3-4, C4-5, C5-6 ANTERIOR CERVICAL DISCECTOMY FUSION, ALLOGRAFT, PLATE;  Surgeon: [name], MD;  Location: [place];  Service: Orthopedics;  Laterality: N/A;   BACK SURGERY     LUMBAR LAMINECTOMY  03/13/2010   right knee replacement  03/13/2010   Patient Active Problem List   Diagnosis Date Noted   Vitamin D deficiency disease 06/21/2022   Encounter for general adult medical examination with abnormal findings 06/21/2022   Vitamin B12 deficiency neuropathy ([place]) 02/15/2022   Hypercalcemia 02/15/2022   Cervical stricture or stenosis 10/20/2021   Cervical spinal stenosis 10/19/2021   Meningioma ([place]) 08/24/2021   PAF (paroxysmal atrial fibrillation) ([place]) 11/03/2020   Stage 3b chronic kidney disease ([place]) 05/06/2020   Spinal stenosis in  cervical region 05/12/2016   Therapeutic opioid-induced constipation ([facility]) 01/01/2016   Chronic idiopathic constipation 09/11/2013   Lumbosacral spondylosis without myelopathy 04/09/2013   Postlaminectomy syndrome, lumbar region 04/09/2013   Anticoagulation management encounter 02/01/2012   Neuropathy, peripheral 08/04/2011   Pure hypercholesterolemia 08/04/2011   Essential hypertension, benign 08/04/2011   Hypothyroidism 08/04/2011   DJD (degenerative joint disease) of knee 08/04/2011   Gout 08/04/2011      THERAPY DIAG:  Cervicalgia  Other abnormalities of gait and mobility  Muscle weakness (generalized)  Abnormal posture  Chronic bilateral low back pain without sciatica  PCP: Janith Lima. MD   REFERRING PROVIDER: [name], MD   REFERRING DIAG: Z98.1 (ICD-10-CM) - S/P cervical spinal fusion    Rationale for Evaluation and Treatment Rehabilitation   ONSET DATE: Dec 2022   SUBJECTIVE:  SUBJECTIVE STATEMENT: She indicated she again drove herself today. She relays she would like to have 1pm appointments now that she can drive herself. She says the RW is starting to bother her shoulder some.     PERTINENT HISTORY:  History of 10/19/21 C3-4 C4-5 C5-6 ACDF with soft collar PMH bracial neuritis, abnormal gait, Degeneration of lumbar, HTN, gouty arthropathy, OA, lumbar laminectomy 2011, Rt TKA   PAIN:  No pain upon arrival.    PRECAUTIONS: None   WEIGHT BEARING RESTRICTIONS No   FALLS:  Has patient fallen in last 6 months? Maybe 1 fall in last 6 months.  Several falls within last year.    LIVING ENVIRONMENT: Lives with:lives alone Lives in: House/apartment Stairs: 3 steps to enter deck, rail on Lt going up.  No stairs in house.  Has following equipment at  home: FWW, cane   OCCUPATION: retired    PLOF: Independent  , cooking/cleaning, shopping/grocery store   [place] stronger, walking independently.     OBJECTIVE:    PATIENT SURVEYS:  01/25/2022 No foto - incorrect set up    COGNITION: 01/25/2022 Overall cognitive status: Within functional limits for tasks assessed     SENSATION: 01/25/2022 not tested today   POSTURE:  01/25/2022 rounded shoulders, forward head, increased thoracic kyphosis, and flexed trunk    PALPATION: 01/25/2022 no specific tenderness noted in screening for balance.       MMT: 01/25/2022:  Seated resisted static testing hip abduction Rt: strong/painless. Lt moderate strength, painless (held MMT due to difficulty in bed mobility positioning MMT Right 01/25/2022 Left 01/25/2022 Left 02/22/2022 Left 03/11/2022 Left 03/16/22 Left 04/01/2022 Left 04/05/22 Left/Right 05/24/22  Shoulder flexion            Shoulder extension            Shoulder abduction            Shoulder adduction            Shoulder extension            Shoulder internal rotation            Shoulder external rotation                                      Hip flexion 5/5 4/5 4/5 4/5 4/5 4+/5 4+ 4+/4+  Hip extension            Hip abduction            Knee flexion 5/5 5/5        Knee extension  5/5 5/5    4+/5 4+ 4+/4+  Ankle DF 5/5 4/5  4/5 4+/5      (Blank rows = not tested)   CERVICAL SPECIAL TESTS:  01/25/2022 none performed   FUNCTIONAL TESTS:    06/24/22 0001  Berg Balance Test  Sit to Stand 3  Standing Unsupported 3  Sitting with Back Unsupported but Feet Supported on Floor or Stool 4  Stand to Sit 3  Transfers 3  Standing Unsupported with Eyes Closed 3  Standing Unsupported with Feet Together 3  From Standing, Reach Forward with Outstretched Arm 3  From Standing Position, Pick up Object from Floor 3  From Standing Position, Turn to Look Behind Over each Shoulder 2  Turn 360 Degrees 1  Standing Unsupported,  Alternately Place Feet on Step/Stool 0  Standing Unsupported,  One Foot in [place] 2  Standing on One Leg 0  Total Score 33     06/03/2022:  TUG c FWW:  34.95 seconds, 32.84 seconds  05/24/22: TUG c FWW:  36 seconds  05/06/2022: TUG c FWW:  37 seconds at end of session  04/05/22 TUG 38.5s seconds with FWW 03/22/22: TUG 44 seconds C FWW (done at very end of session) 03/08/2022:   TUG:  41.76 seconds c FWW 01/25/2022        TUG:   71 seconds c FWW       [facility] PT Assessment - 01/25/22 0001                Standardized Balance Assessment    Standardized Balance Assessment Berg Balance Test          Berg Balance Test    Sit to Stand Able to stand using hands after several tries     Standing Unsupported Able to stand 2 minutes with supervision     Sitting with Back Unsupported but Feet Supported on Floor or Stool Able to sit safely and securely 2 minutes     Stand to Sit Uses backs of legs against chair to control descent     Transfers Able to transfer safely, definite need of hands     Standing Unsupported with Eyes Closed Able to stand 3 seconds     Standing Unsupported with Feet Together Needs help to attain position but able to stand for 30 seconds with feet together     From Standing, Reach Forward with Outstretched Arm Can reach forward >5 cm safely (2")     From Standing Position, Pick up Object from Floor Unable to pick up and needs supervision     From Standing Position, Turn to Look Behind Over each Shoulder Needs assist to keep from losing balance and falling     Turn 360 Degrees Needs assistance while turning     Standing Unsupported, Alternately Place Feet on Step/Stool Needs assistance to keep from falling or unable to try     Standing Unsupported, One Foot in Front Needs help to step but can hold 15 seconds     Standing on One Leg Unable to try or needs assist to prevent fall     Total Score 21                GAIT ASSESSMENT: 03/18/2022:  FWW into clinic c mod  independent, decreased gait speed.  SPC use in Rt UE c CGA 33 ft today c cues for sequencing.    TODAY'S TREATMENT:  07/01/2022: Therex: Recumbent bike seat # 7 Lvl 1-2 10 mins  Leg press - double leg 75 lbs x15, single leg 37 lbs 2 x 12 bilateral with clinician guarding of hyperextension Seated LAQ  seated on airex pad, bilateral 3 lbs 2 x 15 bilateral    TherActivity: Ambulation in clinic between activities c SPC use in Rt UE, gait belt c CGA.   Distances of 60 feet  X2, 30 feet, 20 feet. Stairs in clinic hall 3 steps using bilat Handrails step to pattern down leading with left and up leading with Rt. Then progressed to one handrail and SPC for 3 steps same technique with gait belt and CGA, cues and demo for technique. (Next time work with stairs with RW possibly)  06/24/2022: Therex: Recumbent bike Lvl 2 10 mins  Leg press - double leg 75 lbs 2 x15, single leg 37 lbs 2 x 12  bilateral with clinician guarding of hyperextension Seated LAQ bilateral 4 lbs 2 x 10 bilateral (Lt movement limited due to weight - 3 lbs next visit)  Physical Performance Testing Berg balance testing as listed above with time spent in performance, cues and education. Additional time required to perform due to fatigue, movement pattern speeds.   06/03/2022: Therex: Recumbent bike Lvl 1-2 10 mins  TUG c FWW x 2 Leg press - double leg 75 lbs x15, single leg 37 lbs 2 x 12 bilateral with clinician guarding of hyperextension Seated LAQ bilateral 3 lbs 2 x 15 bilateral  Neuro Re-ed Alt 4 inch step toe tapping c CGA and single hand on bar anteriorly   TherActivity: Ambulation in clinic between activities c SPC use in Rt UE, gait belt c CGA.   Distances of 30 ft, 20 ft., 60 ft Fwd/back stepping 5 ft to simulate bathroom movements.   05/31/2022: Therex: Recumbent bike Lvl 1 6 mins Leg press - double leg 75 lbs 2 x 12, single leg 37 lbs 2 x 12 bilateral  Standing Green band TKE Lt leg 5 sec hold x 12  Seated SLR 2 x  5 bilateral c cues for home use.     PATIENT EDUCATION:  05/06/2022 Education details: HEP update Person educated: Patient Education method: Consulting civil engineer, Demonstration, Verbal cues, and Handouts Education comprehension: verbalized understanding, returned demonstration, and verbal cues required     HOME EXERCISE PROGRAM: Access Code: [email or web address] URL: [email or web address] Date: 05/06/2022 Prepared by: Scot Jun  Exercises - Seated March  - 1-2 x daily - 7 x weekly - 1-2 sets - 10 reps - Sit to Stand  - 2-3 x daily - 7 x weekly - 1 sets - 5-10 reps - Seated Long Arc Quad  - 2-3 x daily - 7 x weekly - 1-2 sets - 10 reps - 2 hold - Standing Scapular Retraction  - 5 x daily - 7 x weekly - 1 sets - 5 reps - 5 second hold - Heel Toe Raises with Counter Support  - 1-2 x daily - 7 x weekly - 1-2 sets - 10 reps - Standing Tandem Balance with Counter Support  - 1-2 x daily - 7 x weekly - 1 sets - 10 reps - Seated Straight Leg Heel Taps  - 1-2 x daily - 7 x weekly - 1-2 sets - 5-10 reps - Seated Quad Set (Mirrored)  - 1-2 x daily - 7 x weekly - 1 sets - 10 reps - 5 hold - Small Range Straight Leg Raise  - 1-2 x daily - 7 x weekly - 1-2 sets - 10-15 reps   ASSESSMENT:   CLINICAL IMPRESSION:  Worked on stairs this time and she was able to progress to performing 3 steps with one handrail and with SPC in other hand. For complete independence she will need to learn how to do with RW so we will try this next time when her legs are not so tired.   OBJECTIVE IMPAIRMENTS Abnormal gait, decreased activity tolerance, decreased balance, decreased endurance, decreased mobility, difficulty walking, decreased strength, impaired perceived functional ability, impaired flexibility, improper body mechanics, postural dysfunction, and pain.    ACTIVITY LIMITATIONS carrying, lifting, bending, standing, squatting, stairs, transfers, bed mobility, reach over head, and locomotion level    PARTICIPATION LIMITATIONS: meal prep, cleaning, interpersonal relationship, driving, shopping, and community activity   PERSONAL FACTORS   History of 10/19/21 C3-4 C4-5 C5-6 ACDF with soft collar PMH bracial neuritis, abnormal gait, Degeneration  of lumbar, HTN, gouty arthropathy, OA, lumbar laminectomy 2011, Rt TKA  are also affecting patient's functional outcome.    REHAB POTENTIAL: Good   CLINICAL DECISION MAKING: Stable/uncomplicated   EVALUATION COMPLEXITY: Low     GOALS: Goals reviewed with patient? Yes   Short term PT Goals (target date for Short term goals are 4 weeks 06/23/22) Patient will demonstrate independent use of home exercise program to maintain progress from in clinic treatments. Goal status: MET   Long term PT goals (target dates for all long term goals are 10 weeks  07/21/2022 )   1. Patient will demonstrate/report pain at worst less than or equal to 2/10 to facilitate minimal limitation in daily activity secondary to pain symptoms. Goal status: MET 04/05/22   2. Patient will demonstrate independent use of home exercise program to facilitate ability to maintain/progress functional gains from skilled physical therapy services. Goal status: MET 05/24/22   3. Patient will demonstrate bilateral hip MMT 5/5 throughout to facilitate improved transfers, ambulation towards independence.  Goal status:on going 06/24/2022   4.  Patient will demonstrate BERG testing > or = 45 to indicate reduced fall risk.  Goal status: on going 06/24/2022   5.  Patient will demonstrate TUG c LRAD < or = 20 seconds to indicate reduced fall risk/ improved community ambulation.    Goal status: on going 06/24/2022   6.  Patient will demonstrate ability to ambulation c LRAD (SPC, independent) community distances > 300 ft.   Goal status: on going 06/24/2022       PLAN: PT FREQUENCY: 1-2x/week   PT DURATION: 6 more  weeks from recert   PLANNED INTERVENTIONS: Therapeutic exercises,  Therapeutic activity, Neuro Muscular re-education, Balance training, Gait training, Patient/Family education, Joint mobilization, Stair training, DME instructions, Dry Needling, Electrical stimulation, Cryotherapy, Moist heat, Taping, Ultrasound, Ionotophoresis 9m/ml Dexamethasone, and Manual therapy.  All included unless contraindicated   PLAN FOR NEXT SESSION:  Stair navigation training with RW to mimic home with one handrail.   BElsie Ra PT, DPT 07/01/22 4:40 PM

## 2022-07-02 ENCOUNTER — Ambulatory Visit: Payer: PPO

## 2022-07-05 ENCOUNTER — Ambulatory Visit (INDEPENDENT_AMBULATORY_CARE_PROVIDER_SITE_OTHER): Payer: PPO | Admitting: Physical Therapy

## 2022-07-05 ENCOUNTER — Ambulatory Visit (INDEPENDENT_AMBULATORY_CARE_PROVIDER_SITE_OTHER): Payer: PPO

## 2022-07-05 ENCOUNTER — Encounter: Payer: Self-pay | Admitting: Physical Therapy

## 2022-07-05 ENCOUNTER — Encounter: Payer: PPO | Admitting: Physical Therapy

## 2022-07-05 DIAGNOSIS — M545 Low back pain, unspecified: Secondary | ICD-10-CM | POA: Diagnosis not present

## 2022-07-05 DIAGNOSIS — R2689 Other abnormalities of gait and mobility: Secondary | ICD-10-CM | POA: Diagnosis not present

## 2022-07-05 DIAGNOSIS — G8929 Other chronic pain: Secondary | ICD-10-CM | POA: Diagnosis not present

## 2022-07-05 DIAGNOSIS — E538 Deficiency of other specified B group vitamins: Secondary | ICD-10-CM

## 2022-07-05 DIAGNOSIS — M542 Cervicalgia: Secondary | ICD-10-CM

## 2022-07-05 DIAGNOSIS — R293 Abnormal posture: Secondary | ICD-10-CM | POA: Diagnosis not present

## 2022-07-05 DIAGNOSIS — M6281 Muscle weakness (generalized): Secondary | ICD-10-CM | POA: Diagnosis not present

## 2022-07-05 MED ORDER — CYANOCOBALAMIN 1000 MCG/ML IJ SOLN
1000.0000 ug | Freq: Once | INTRAMUSCULAR | Status: AC
  Administered 2022-07-05: 1000 ug via INTRAMUSCULAR

## 2022-07-05 NOTE — Therapy (Signed)
OUTPATIENT PHYSICAL THERAPY TREATMENT NOTE    Patient Name: Claudia [name] MRN: [id number] DOB:05-21-1934, 89 y.o., female Today's Date: 07/05/2022   END OF SESSION:   PT End of Session - 07/05/22 1343     Visit Number 30    Number of Visits 40    Date for PT Re-Evaluation 07/19/22    Authorization Type [employer] $15 copay    Progress Note Due on Visit 34   did last one at #24   PT Start Time 1300    PT Stop Time 1338    PT Time Calculation (min) 38 min    Equipment Utilized During Treatment Gait belt    Activity Tolerance Patient tolerated treatment well    Behavior During Therapy WFL for tasks assessed/performed                   Past Medical History:  Diagnosis Date   Abnormality of gait    Brachial neuritis or radiculitis NOS    Degeneration of lumbar or lumbosacral intervertebral disc    Dysrhythmia    Essential hypertension, benign    Gouty arthropathy    Lumbago    Obesity    Osteoarthritis    Pure hypercholesterolemia    Unspecified hypothyroidism    Past Surgical History:  Procedure Laterality Date   ABDOMINAL HYSTERECTOMY  03/13/2010   ANTERIOR CERVICAL DECOMP/DISCECTOMY FUSION N/A 10/19/2021   Procedure: C3-4, C4-5, C5-6 ANTERIOR CERVICAL DISCECTOMY FUSION, ALLOGRAFT, PLATE;  Surgeon: [name], MD;  Location: [facility];  Service: Orthopedics;  Laterality: N/A;   BACK SURGERY     LUMBAR LAMINECTOMY  03/13/2010   right knee replacement  03/13/2010   Patient Active Problem List   Diagnosis Date Noted   Vitamin D deficiency disease 06/21/2022   Encounter for general adult medical examination with abnormal findings 06/21/2022   Vitamin B12 deficiency neuropathy ([place]) 02/15/2022   Hypercalcemia 02/15/2022   Cervical stricture or stenosis 10/20/2021   Cervical spinal stenosis 10/19/2021   Meningioma ([place]) 08/24/2021   PAF (paroxysmal atrial fibrillation) ([place]) 11/03/2020   Stage 3b chronic kidney disease ([place]) 05/06/2020   Spinal stenosis in  cervical region 05/12/2016   Therapeutic opioid-induced constipation ([facility]) 01/01/2016   Chronic idiopathic constipation 09/11/2013   Lumbosacral spondylosis without myelopathy 04/09/2013   Postlaminectomy syndrome, lumbar region 04/09/2013   Anticoagulation management encounter 02/01/2012   Neuropathy, peripheral 08/04/2011   Pure hypercholesterolemia 08/04/2011   Essential hypertension, benign 08/04/2011   Hypothyroidism 08/04/2011   DJD (degenerative joint disease) of knee 08/04/2011   Gout 08/04/2011      THERAPY DIAG:  Cervicalgia  Other abnormalities of gait and mobility  Muscle weakness (generalized)  Abnormal posture  Chronic bilateral low back pain without sciatica  PCP: Janith Lima. MD   REFERRING PROVIDER: [name], MD   REFERRING DIAG: Z98.1 (ICD-10-CM) - S/P cervical spinal fusion    Rationale for Evaluation and Treatment Rehabilitation   ONSET DATE: Dec 2022   SUBJECTIVE:  SUBJECTIVE STATEMENT: She indicates she needs to leave a little early for another MD appointment. She denies pain. She wants to work on stairs so she can do this independently and not have to rely on her neighbor's help     PERTINENT HISTORY:  History of 10/19/21 C3-4 C4-5 C5-6 ACDF with soft collar PMH bracial neuritis, abnormal gait, Degeneration of lumbar, HTN, gouty arthropathy, OA, lumbar laminectomy 2011, Rt TKA   PAIN:  No pain upon arrival.    PRECAUTIONS: None   WEIGHT BEARING RESTRICTIONS No   FALLS:  Has patient fallen in last 6 months? Maybe 1 fall in last 6 months.  Several falls within last year.    LIVING ENVIRONMENT: Lives with:lives alone Lives in: House/apartment Stairs: 3 steps to enter deck, rail on Lt going up.  No stairs in house.  Has following  equipment at home: FWW, cane   OCCUPATION: retired    PLOF: Independent  , cooking/cleaning, shopping/grocery store   [place] stronger, walking independently.     OBJECTIVE:    PATIENT SURVEYS:  01/25/2022 No foto - incorrect set up    COGNITION: 01/25/2022 Overall cognitive status: Within functional limits for tasks assessed     SENSATION: 01/25/2022 not tested today   POSTURE:  01/25/2022 rounded shoulders, forward head, increased thoracic kyphosis, and flexed trunk    PALPATION: 01/25/2022 no specific tenderness noted in screening for balance.       MMT: 01/25/2022:  Seated resisted static testing hip abduction Rt: strong/painless. Lt moderate strength, painless (held MMT due to difficulty in bed mobility positioning MMT Right 01/25/2022 Left 01/25/2022 Left 02/22/2022 Left 03/11/2022 Left 03/16/22 Left 04/01/2022 Left 04/05/22 Left/Right 05/24/22  Shoulder flexion            Shoulder extension            Shoulder abduction            Shoulder adduction            Shoulder extension            Shoulder internal rotation            Shoulder external rotation                                      Hip flexion 5/5 4/5 4/5 4/5 4/5 4+/5 4+ 4+/4+  Hip extension            Hip abduction            Knee flexion 5/5 5/5        Knee extension  5/5 5/5    4+/5 4+ 4+/4+  Ankle DF 5/5 4/5  4/5 4+/5      (Blank rows = not tested)   CERVICAL SPECIAL TESTS:  01/25/2022 none performed   FUNCTIONAL TESTS:    06/24/22 0001  Berg Balance Test  Sit to Stand 3  Standing Unsupported 3  Sitting with Back Unsupported but Feet Supported on Floor or Stool 4  Stand to Sit 3  Transfers 3  Standing Unsupported with Eyes Closed 3  Standing Unsupported with Feet Together 3  From Standing, Reach Forward with Outstretched Arm 3  From Standing Position, Pick up Object from Floor 3  From Standing Position, Turn to Look Behind Over each Shoulder 2  Turn 360 Degrees 1  Standing  Unsupported, Alternately Place Feet on Step/Stool  0  Standing Unsupported, One Foot in [place] 2  Standing on One Leg 0  Total Score 33     06/03/2022:  TUG c FWW:  34.95 seconds, 32.84 seconds  05/24/22: TUG c FWW:  36 seconds  05/06/2022: TUG c FWW:  37 seconds at end of session  04/05/22 TUG 38.5s seconds with FWW 03/22/22: TUG 44 seconds C FWW (done at very end of session) 03/08/2022:   TUG:  41.76 seconds c FWW 01/25/2022        TUG:   71 seconds c FWW       [facility] PT Assessment - 01/25/22 0001                Standardized Balance Assessment    Standardized Balance Assessment Berg Balance Test          Berg Balance Test    Sit to Stand Able to stand using hands after several tries     Standing Unsupported Able to stand 2 minutes with supervision     Sitting with Back Unsupported but Feet Supported on Floor or Stool Able to sit safely and securely 2 minutes     Stand to Sit Uses backs of legs against chair to control descent     Transfers Able to transfer safely, definite need of hands     Standing Unsupported with Eyes Closed Able to stand 3 seconds     Standing Unsupported with Feet Together Needs help to attain position but able to stand for 30 seconds with feet together     From Standing, Reach Forward with Outstretched Arm Can reach forward >5 cm safely (2")     From Standing Position, Pick up Object from Floor Unable to pick up and needs supervision     From Standing Position, Turn to Look Behind Over each Shoulder Needs assist to keep from losing balance and falling     Turn 360 Degrees Needs assistance while turning     Standing Unsupported, Alternately Place Feet on Step/Stool Needs assistance to keep from falling or unable to try     Standing Unsupported, One Foot in Front Needs help to step but can hold 15 seconds     Standing on One Leg Unable to try or needs assist to prevent fall     Total Score 21                GAIT ASSESSMENT: 03/18/2022:  FWW into clinic c  mod independent, decreased gait speed.  SPC use in Rt UE c CGA 33 ft today c cues for sequencing.    TODAY'S TREATMENT:  07/01/2022: Therex: Recumbent bike seat # 8 Lvl 1-2 10 mins  Leg press - double leg 75 lbs 2x15, single leg 37 lbs 2 x 12 bilateral with clinician guarding of hyperextension Seated LAQ  seated on airex pad, bilateral 3 lbs 2 x 15 bilateral    TherActivity: Ambulation in clinic between activities c SPC use in Rt UE, gait belt c CGA.   Distances of 75 feet, 30 feet  X2.  Ambulation with RW 75 feet X 2 Stairs in clinic hall with gait belt and CGA for  2 steps using one Handrail  on the Right, step to pattern down leading with left and up leading with Rt Using RW in left hand that was folded up. Pefrormed 2 more steps using Rt handrail going up/down lateral holding RW around wrists  06/24/2022: Therex: Recumbent bike Lvl 2 10 mins  Leg press -  double leg 75 lbs 2 x15, single leg 37 lbs 2 x 12 bilateral with clinician guarding of hyperextension Seated LAQ bilateral 4 lbs 2 x 10 bilateral (Lt movement limited due to weight - 3 lbs next visit)  Physical Performance Testing Berg balance testing as listed above with time spent in performance, cues and education. Additional time required to perform due to fatigue, movement pattern speeds.   06/03/2022: Therex: Recumbent bike Lvl 1-2 10 mins  TUG c FWW x 2 Leg press - double leg 75 lbs x15, single leg 37 lbs 2 x 12 bilateral with clinician guarding of hyperextension Seated LAQ bilateral 3 lbs 2 x 15 bilateral  Neuro Re-ed Alt 4 inch step toe tapping c CGA and single hand on bar anteriorly   TherActivity: Ambulation in clinic between activities c SPC use in Rt UE, gait belt c CGA.   Distances of 30 ft, 20 ft., 60 ft Fwd/back stepping 5 ft to simulate bathroom movements.       PATIENT EDUCATION:  05/06/2022 Education details: HEP update Person educated: Patient Education method: Consulting civil engineer, Demonstration, Verbal cues,  and Handouts Education comprehension: verbalized understanding, returned demonstration, and verbal cues required     HOME EXERCISE PROGRAM: Access Code: [email or web address] URL: [email or web address] Date: 05/06/2022 Prepared by: Scot Jun  Exercises - Seated March  - 1-2 x daily - 7 x weekly - 1-2 sets - 10 reps - Sit to Stand  - 2-3 x daily - 7 x weekly - 1 sets - 5-10 reps - Seated Long Arc Quad  - 2-3 x daily - 7 x weekly - 1-2 sets - 10 reps - 2 hold - Standing Scapular Retraction  - 5 x daily - 7 x weekly - 1 sets - 5 reps - 5 second hold - Heel Toe Raises with Counter Support  - 1-2 x daily - 7 x weekly - 1-2 sets - 10 reps - Standing Tandem Balance with Counter Support  - 1-2 x daily - 7 x weekly - 1 sets - 10 reps - Seated Straight Leg Heel Taps  - 1-2 x daily - 7 x weekly - 1-2 sets - 5-10 reps - Seated Quad Set (Mirrored)  - 1-2 x daily - 7 x weekly - 1 sets - 10 reps - 5 hold - Small Range Straight Leg Raise  - 1-2 x daily - 7 x weekly - 1-2 sets - 10-15 reps   ASSESSMENT:   CLINICAL IMPRESSION:  We again worked on stairs with strategies she can use with her RW so she can be more independent with this. We also continued to work on gait with SPC inside the clinic but PT would still recommend RW for outside of clinic until gait stability improves. She will continue to benefit from skilled PT to address her remaining functional deficits.    OBJECTIVE IMPAIRMENTS Abnormal gait, decreased activity tolerance, decreased balance, decreased endurance, decreased mobility, difficulty walking, decreased strength, impaired perceived functional ability, impaired flexibility, improper body mechanics, postural dysfunction, and pain.    ACTIVITY LIMITATIONS carrying, lifting, bending, standing, squatting, stairs, transfers, bed mobility, reach over head, and locomotion level   PARTICIPATION LIMITATIONS: meal prep, cleaning, interpersonal relationship, driving, shopping, and community  activity   PERSONAL FACTORS   History of 10/19/21 C3-4 C4-5 C5-6 ACDF with soft collar PMH bracial neuritis, abnormal gait, Degeneration of lumbar, HTN, gouty arthropathy, OA, lumbar laminectomy 2011, Rt TKA  are also affecting patient's functional outcome.    REHAB POTENTIAL: Good  CLINICAL DECISION MAKING: Stable/uncomplicated   EVALUATION COMPLEXITY: Low     GOALS: Goals reviewed with patient? Yes   Short term PT Goals (target date for Short term goals are 4 weeks 06/23/22) Patient will demonstrate independent use of home exercise program to maintain progress from in clinic treatments. Goal status: MET   Long term PT goals (target dates for all long term goals are 10 weeks  07/21/2022 )   1. Patient will demonstrate/report pain at worst less than or equal to 2/10 to facilitate minimal limitation in daily activity secondary to pain symptoms. Goal status: MET 04/05/22   2. Patient will demonstrate independent use of home exercise program to facilitate ability to maintain/progress functional gains from skilled physical therapy services. Goal status: MET 05/24/22   3. Patient will demonstrate bilateral hip MMT 5/5 throughout to facilitate improved transfers, ambulation towards independence.  Goal status:on going 06/24/2022   4.  Patient will demonstrate BERG testing > or = 45 to indicate reduced fall risk.  Goal status: on going 06/24/2022   5.  Patient will demonstrate TUG c LRAD < or = 20 seconds to indicate reduced fall risk/ improved community ambulation.    Goal status: on going 06/24/2022   6.  Patient will demonstrate ability to ambulation c LRAD (SPC, independent) community distances > 300 ft.   Goal status: on going 06/24/2022       PLAN: PT FREQUENCY: 1-2x/week   PT DURATION: 6 more  weeks from recert   PLANNED INTERVENTIONS: Therapeutic exercises, Therapeutic activity, Neuro Muscular re-education, Balance training, Gait training, Patient/Family education, Joint  mobilization, Stair training, DME instructions, Dry Needling, Electrical stimulation, Cryotherapy, Moist heat, Taping, Ultrasound, Ionotophoresis '4mg'$ /ml Dexamethasone, and Manual therapy.  All included unless contraindicated   PLAN FOR NEXT SESSION:  Stair navigation training with RW to mimic home with one handrail. Gait and balance with SPC as able, leg strength as tolerated. Will need progress note at visit [place], PT, DPT 07/05/22 2:02 PM

## 2022-07-05 NOTE — Progress Notes (Signed)
After obtaining consent, and per orders of Dr. Jones, injection of B12 given by Vega Stare P Tequan Redmon. Patient instructed to report any adverse reaction to me immediately.  

## 2022-07-07 ENCOUNTER — Ambulatory Visit: Payer: PPO

## 2022-07-08 ENCOUNTER — Ambulatory Visit (INDEPENDENT_AMBULATORY_CARE_PROVIDER_SITE_OTHER): Payer: PPO | Admitting: Rehabilitative and Restorative Service Providers"

## 2022-07-08 ENCOUNTER — Encounter: Payer: Self-pay | Admitting: Rehabilitative and Restorative Service Providers"

## 2022-07-08 DIAGNOSIS — R293 Abnormal posture: Secondary | ICD-10-CM

## 2022-07-08 DIAGNOSIS — R2689 Other abnormalities of gait and mobility: Secondary | ICD-10-CM | POA: Diagnosis not present

## 2022-07-08 DIAGNOSIS — M6281 Muscle weakness (generalized): Secondary | ICD-10-CM

## 2022-07-08 DIAGNOSIS — M542 Cervicalgia: Secondary | ICD-10-CM | POA: Diagnosis not present

## 2022-07-08 NOTE — Therapy (Signed)
OUTPATIENT PHYSICAL THERAPY TREATMENT NOTE    Patient Name: Claudia [name] MRN: [id number] DOB:[birth year], 89 y.o., female Today's Date: 07/09/2022   END OF SESSION:   PT End of Session - 07/08/22 1610     Visit Number 31    Number of Visits 40    Date for PT Re-Evaluation 07/19/22    Authorization Type [employer] $15 copay    Progress Note Due on Visit 34   did last one at #24   PT Start Time 1555    PT Stop Time 1648    PT Time Calculation (min) 53 min    Equipment Utilized During Treatment Gait belt    Activity Tolerance Patient tolerated treatment well    Behavior During Therapy WFL for tasks assessed/performed                    Past Medical History:  Diagnosis Date   Abnormality of gait    Brachial neuritis or radiculitis NOS    Degeneration of lumbar or lumbosacral intervertebral disc    Dysrhythmia    Essential hypertension, benign    Gouty arthropathy    Lumbago    Obesity    Osteoarthritis    Pure hypercholesterolemia    Unspecified hypothyroidism    Past Surgical History:  Procedure Laterality Date   ABDOMINAL HYSTERECTOMY  03/13/2010   ANTERIOR CERVICAL DECOMP/DISCECTOMY FUSION N/A 10/19/2021   Procedure: C3-4, C4-5, C5-6 ANTERIOR CERVICAL DISCECTOMY FUSION, ALLOGRAFT, PLATE;  Surgeon: [name], MD;  Location: [facility];  Service: Orthopedics;  Laterality: N/A;   BACK SURGERY     LUMBAR LAMINECTOMY  03/13/2010   right knee replacement  03/13/2010   Patient Active Problem List   Diagnosis Date Noted   Vitamin D deficiency disease 06/21/2022   Encounter for general adult medical examination with abnormal findings 06/21/2022   Vitamin B12 deficiency neuropathy ([place]) 02/15/2022   Hypercalcemia 02/15/2022   Cervical stricture or stenosis 10/20/2021   Cervical spinal stenosis 10/19/2021   Meningioma ([place]) 08/24/2021   PAF (paroxysmal atrial fibrillation) ([place]) 11/03/2020   Stage 3b chronic kidney disease ([place]) 05/06/2020   Spinal stenosis  in cervical region 05/12/2016   Therapeutic opioid-induced constipation ([facility]) 01/01/2016   Chronic idiopathic constipation 09/11/2013   Lumbosacral spondylosis without myelopathy 04/09/2013   Postlaminectomy syndrome, lumbar region 04/09/2013   Anticoagulation management encounter 02/01/2012   Neuropathy, peripheral 08/04/2011   Pure hypercholesterolemia 08/04/2011   Essential hypertension, benign 08/04/2011   Hypothyroidism 08/04/2011   DJD (degenerative joint disease) of knee 08/04/2011   Gout 08/04/2011      THERAPY DIAG:  Cervicalgia  Other abnormalities of gait and mobility  Muscle weakness (generalized)  Abnormal posture  PCP: Janith Lima. MD   REFERRING PROVIDER: [name], MD   REFERRING DIAG: Z98.1 (ICD-10-CM) - S/P cervical spinal fusion    Rationale for Evaluation and Treatment Rehabilitation   ONSET DATE: Dec 2022   SUBJECTIVE:  SUBJECTIVE STATEMENT: Pt. Indicated feeling tired after last visit.  She indicated she wasn't sure about using the walker on steps.  Felt more comfortable with cane.  Does some walking c cane.    PERTINENT HISTORY:  History of 10/19/21 C3-4 C4-5 C5-6 ACDF with soft collar PMH bracial neuritis, abnormal gait, Degeneration of lumbar, HTN, gouty arthropathy, OA, lumbar laminectomy 2011, Rt TKA   PAIN:  No pain upon arrival.    PRECAUTIONS: None   WEIGHT BEARING RESTRICTIONS No   FALLS:  Has patient fallen in last 6 months? Maybe 1 fall in last 6 months.  Several falls within last year.    LIVING ENVIRONMENT: Lives with:lives alone Lives in: House/apartment Stairs: 3 steps to enter deck, rail on Lt going up.  No stairs in house.  Has following equipment at home: FWW, cane   OCCUPATION: retired    PLOF: Independent  ,  cooking/cleaning, shopping/grocery store   [place] stronger, walking independently.     OBJECTIVE:    PATIENT SURVEYS:  01/25/2022 No foto - incorrect set up    COGNITION: 01/25/2022 Overall cognitive status: Within functional limits for tasks assessed     SENSATION: 01/25/2022 not tested today   POSTURE:  01/25/2022 rounded shoulders, forward head, increased thoracic kyphosis, and flexed trunk    PALPATION: 01/25/2022 no specific tenderness noted in screening for balance.       MMT: 01/25/2022:  Seated resisted static testing hip abduction Rt: strong/painless. Lt moderate strength, painless (held MMT due to difficulty in bed mobility positioning MMT Right 01/25/2022 Left 01/25/2022 Left 02/22/2022 Left 03/11/2022 Left 03/16/22 Left 04/01/2022 Left 04/05/22 Left/Right 05/24/22  Shoulder flexion            Shoulder extension            Shoulder abduction            Shoulder adduction            Shoulder extension            Shoulder internal rotation            Shoulder external rotation                                      Hip flexion 5/5 4/5 4/5 4/5 4/5 4+/5 4+ 4+/4+  Hip extension            Hip abduction            Knee flexion 5/5 5/5        Knee extension  5/5 5/5    4+/5 4+ 4+/4+  Ankle DF 5/5 4/5  4/5 4+/5      (Blank rows = not tested)   CERVICAL SPECIAL TESTS:  01/25/2022 none performed   FUNCTIONAL TESTS:    06/24/22 0001  Berg Balance Test  Sit to Stand 3  Standing Unsupported 3  Sitting with Back Unsupported but Feet Supported on Floor or Stool 4  Stand to Sit 3  Transfers 3  Standing Unsupported with Eyes Closed 3  Standing Unsupported with Feet Together 3  From Standing, Reach Forward with Outstretched Arm 3  From Standing Position, Pick up Object from Floor 3  From Standing Position, Turn to Look Behind Over each Shoulder 2  Turn 360 Degrees 1  Standing Unsupported, Alternately Place Feet on Step/Stool 0  Standing Unsupported, One  Foot in  Front 2  Standing on One Leg 0  Total Score 33     06/03/2022:  TUG c FWW:  34.95 seconds, 32.84 seconds  05/24/22: TUG c FWW:  36 seconds  05/06/2022: TUG c FWW:  37 seconds at end of session  04/05/22 TUG 38.5s seconds with FWW 03/22/22: TUG 44 seconds C FWW (done at very end of session) 03/08/2022:   TUG:  41.76 seconds c FWW 01/25/2022        TUG:   71 seconds c FWW       [facility] PT Assessment - 01/25/22 0001                Standardized Balance Assessment    Standardized Balance Assessment Berg Balance Test          Berg Balance Test    Sit to Stand Able to stand using hands after several tries     Standing Unsupported Able to stand 2 minutes with supervision     Sitting with Back Unsupported but Feet Supported on Floor or Stool Able to sit safely and securely 2 minutes     Stand to Sit Uses backs of legs against chair to control descent     Transfers Able to transfer safely, definite need of hands     Standing Unsupported with Eyes Closed Able to stand 3 seconds     Standing Unsupported with Feet Together Needs help to attain position but able to stand for 30 seconds with feet together     From Standing, Reach Forward with Outstretched Arm Can reach forward >5 cm safely (2")     From Standing Position, Pick up Object from Floor Unable to pick up and needs supervision     From Standing Position, Turn to Look Behind Over each Shoulder Needs assist to keep from losing balance and falling     Turn 360 Degrees Needs assistance while turning     Standing Unsupported, Alternately Place Feet on Step/Stool Needs assistance to keep from falling or unable to try     Standing Unsupported, One Foot in Front Needs help to step but can hold 15 seconds     Standing on One Leg Unable to try or needs assist to prevent fall     Total Score 21                GAIT ASSESSMENT: 07/09/2022:  Ambulation mod independent c FWW with marked reduced gait speed, limited in distance due to  fatigue (household distances).  Able to perform ambulation c SPC c CGA to occasional SBA within clinic distances < 100 ft.   03/18/2022:  FWW into clinic c mod independent, decreased gait speed.  SPC use in Rt UE c CGA 33 ft today c cues for sequencing.    TODAY'S TREATMENT:  07/08/2022: Therex: Recumbent bike seat # 8 Lvl 1-2 10 mins  Leg press - double leg 75 lbs 2x15, single leg 37 lbs 2 x 12 bilateral with clinician guarding of hyperextension Seated LAQ  seated on airex pad, bilateral 3 lbs 2 x 15 bilateral   TherActivity: Ambulation in clinic c FWW 100 ft x 2 c supervision.  Ambulation c SPC in Rt UE 60 ft, 80 ft c elevator ride.  FWW ambulation out of clinic into parking lot 100 ft c supervision for car transfer. Gait belt present with cane usage.   Stair navigation training within clinic to mimic household stair (2 stairs c Lt handrail ascending).  Performed 2 rounds  with use of SPC and CGA c verbal cues for sequencing.  Performed 2 rounds with use of FWW folded in Rt hand going up and Lt going down.  CGA primarily with one instance of moderate assist required to prevent loss of balance.  Improved tolerance and techniques were self reported by patient compared to last visit.   07/01/2022: Therex: Recumbent bike seat # 8 Lvl 1-2 10 mins  Leg press - double leg 75 lbs 2x15, single leg 37 lbs 2 x 12 bilateral with clinician guarding of hyperextension Seated LAQ  seated on airex pad, bilateral 3 lbs 2 x 15 bilateral    TherActivity: Ambulation in clinic between activities c SPC use in Rt UE, gait belt c CGA.   Distances of 75 feet, 30 feet  X2.  Ambulation with RW 75 feet X 2 Stairs in clinic hall with gait belt and CGA for  2 steps using one Handrail  on the Right, step to pattern down leading with left and up leading with Rt Using RW in left hand that was folded up. Pefrormed 2 more steps using Rt handrail going up/down lateral holding RW around wrists  06/24/2022: Therex: Recumbent  bike Lvl 2 10 mins  Leg press - double leg 75 lbs 2 x15, single leg 37 lbs 2 x 12 bilateral with clinician guarding of hyperextension Seated LAQ bilateral 4 lbs 2 x 10 bilateral (Lt movement limited due to weight - 3 lbs next visit)  Physical Performance Testing Berg balance testing as listed above with time spent in performance, cues and education. Additional time required to perform due to fatigue, movement pattern speeds.   PATIENT EDUCATION:  05/06/2022 Education details: HEP update Person educated: Patient Education method: Consulting civil engineer, Demonstration, Verbal cues, and Handouts Education comprehension: verbalized understanding, returned demonstration, and verbal cues required     HOME EXERCISE PROGRAM: Access Code: [email or web address] URL: [email or web address] Date: 05/06/2022 Prepared by: Scot Jun  Exercises - Seated March  - 1-2 x daily - 7 x weekly - 1-2 sets - 10 reps - Sit to Stand  - 2-3 x daily - 7 x weekly - 1 sets - 5-10 reps - Seated Long Arc Quad  - 2-3 x daily - 7 x weekly - 1-2 sets - 10 reps - 2 hold - Standing Scapular Retraction  - 5 x daily - 7 x weekly - 1 sets - 5 reps - 5 second hold - Heel Toe Raises with Counter Support  - 1-2 x daily - 7 x weekly - 1-2 sets - 10 reps - Standing Tandem Balance with Counter Support  - 1-2 x daily - 7 x weekly - 1 sets - 10 reps - Seated Straight Leg Heel Taps  - 1-2 x daily - 7 x weekly - 1-2 sets - 5-10 reps - Seated Quad Set (Mirrored)  - 1-2 x daily - 7 x weekly - 1 sets - 10 reps - 5 hold - Small Range Straight Leg Raise  - 1-2 x daily - 7 x weekly - 1-2 sets - 10-15 reps   ASSESSMENT:   CLINICAL IMPRESSION:  Pt continued to demonstrate necessity for various levels of guarding for SPC use and stair navigation.  Continued strengthening, specifically for Lt leg as well as continued training and reps on use stair navigation to improve independence for home use.   OBJECTIVE IMPAIRMENTS Abnormal gait, decreased  activity tolerance, decreased balance, decreased endurance, decreased mobility, difficulty walking, decreased strength, impaired perceived functional ability, impaired flexibility, improper body  mechanics, postural dysfunction, and pain.    ACTIVITY LIMITATIONS carrying, lifting, bending, standing, squatting, stairs, transfers, bed mobility, reach over head, and locomotion level   PARTICIPATION LIMITATIONS: meal prep, cleaning, interpersonal relationship, driving, shopping, and community activity   PERSONAL FACTORS   History of 10/19/21 C3-4 C4-5 C5-6 ACDF with soft collar PMH bracial neuritis, abnormal gait, Degeneration of lumbar, HTN, gouty arthropathy, OA, lumbar laminectomy 2011, Rt TKA  are also affecting patient's functional outcome.    REHAB POTENTIAL: Good   CLINICAL DECISION MAKING: Stable/uncomplicated   EVALUATION COMPLEXITY: Low     GOALS: Goals reviewed with patient? Yes   Short term PT Goals (target date for Short term goals are 4 weeks 06/23/22) Patient will demonstrate independent use of home exercise program to maintain progress from in clinic treatments. Goal status: MET   Long term PT goals (target dates for all long term goals are 10 weeks  07/21/2022 )   1. Patient will demonstrate/report pain at worst less than or equal to 2/10 to facilitate minimal limitation in daily activity secondary to pain symptoms. Goal status: MET 04/05/22   2. Patient will demonstrate independent use of home exercise program to facilitate ability to maintain/progress functional gains from skilled physical therapy services. Goal status: MET 05/24/22   3. Patient will demonstrate bilateral hip MMT 5/5 throughout to facilitate improved transfers, ambulation towards independence.  Goal status:on going 06/24/2022   4.  Patient will demonstrate BERG testing > or = 45 to indicate reduced fall risk.  Goal status: on going 06/24/2022   5.  Patient will demonstrate TUG c LRAD < or = 20 seconds  to indicate reduced fall risk/ improved community ambulation.    Goal status: on going 06/24/2022   6.  Patient will demonstrate ability to ambulation c LRAD (SPC, independent) community distances > 300 ft.   Goal status: on going 06/24/2022       PLAN: PT FREQUENCY: 1-2x/week   PT DURATION: 6 more  weeks from recert   PLANNED INTERVENTIONS: Therapeutic exercises, Therapeutic activity, Neuro Muscular re-education, Balance training, Gait training, Patient/Family education, Joint mobilization, Stair training, DME instructions, Dry Needling, Electrical stimulation, Cryotherapy, Moist heat, Taping, Ultrasound, Ionotophoresis '4mg'$ /ml Dexamethasone, and Manual therapy.  All included unless contraindicated   PLAN FOR NEXT SESSION:  Stair navigation training with RW to mimic home with one handrail. Gait and balance with SPC as able, leg strength as tolerated.   Scot Jun, PT, DPT, OCS, ATC 07/09/22  7:54 AM

## 2022-07-09 ENCOUNTER — Encounter: Payer: Self-pay | Admitting: Rehabilitative and Restorative Service Providers"

## 2022-07-12 ENCOUNTER — Ambulatory Visit (INDEPENDENT_AMBULATORY_CARE_PROVIDER_SITE_OTHER): Payer: PPO | Admitting: Rehabilitative and Restorative Service Providers"

## 2022-07-12 ENCOUNTER — Encounter: Payer: Self-pay | Admitting: Rehabilitative and Restorative Service Providers"

## 2022-07-12 DIAGNOSIS — M545 Low back pain, unspecified: Secondary | ICD-10-CM | POA: Diagnosis not present

## 2022-07-12 DIAGNOSIS — M6281 Muscle weakness (generalized): Secondary | ICD-10-CM

## 2022-07-12 DIAGNOSIS — R293 Abnormal posture: Secondary | ICD-10-CM

## 2022-07-12 DIAGNOSIS — G8929 Other chronic pain: Secondary | ICD-10-CM

## 2022-07-12 DIAGNOSIS — R2689 Other abnormalities of gait and mobility: Secondary | ICD-10-CM

## 2022-07-12 DIAGNOSIS — M542 Cervicalgia: Secondary | ICD-10-CM | POA: Diagnosis not present

## 2022-07-12 NOTE — Therapy (Signed)
OUTPATIENT PHYSICAL THERAPY TREATMENT NOTE    Patient Name: Claudia [name] MRN: [id number] DOB:1933-11-27, 89 y.o., female Today's Date: 07/12/2022   END OF SESSION:   PT End of Session - 07/12/22 1255     Visit Number 32    Number of Visits 40    Date for PT Re-Evaluation 07/19/22    Authorization Type [employer] $15 copay    Progress Note Due on Visit 34   did last one at #24   PT Start Time 1256    PT Stop Time 1340    PT Time Calculation (min) 44 min    Equipment Utilized During Treatment Gait belt    Activity Tolerance Patient tolerated treatment well    Behavior During Therapy WFL for tasks assessed/performed              Past Medical History:  Diagnosis Date   Abnormality of gait    Brachial neuritis or radiculitis NOS    Degeneration of lumbar or lumbosacral intervertebral disc    Dysrhythmia    Essential hypertension, benign    Gouty arthropathy    Lumbago    Obesity    Osteoarthritis    Pure hypercholesterolemia    Unspecified hypothyroidism    Past Surgical History:  Procedure Laterality Date   ABDOMINAL HYSTERECTOMY  03/13/2010   ANTERIOR CERVICAL DECOMP/DISCECTOMY FUSION N/A 10/19/2021   Procedure: C3-4, C4-5, C5-6 ANTERIOR CERVICAL DISCECTOMY FUSION, ALLOGRAFT, PLATE;  Surgeon: [name], MD;  Location: [facility];  Service: Orthopedics;  Laterality: N/A;   BACK SURGERY     LUMBAR LAMINECTOMY  03/13/2010   right knee replacement  03/13/2010   Patient Active Problem List   Diagnosis Date Noted   Vitamin D deficiency disease 06/21/2022   Encounter for general adult medical examination with abnormal findings 06/21/2022   Vitamin B12 deficiency neuropathy ([place]) 02/15/2022   Hypercalcemia 02/15/2022   Cervical stricture or stenosis 10/20/2021   Cervical spinal stenosis 10/19/2021   Meningioma ([place]) 08/24/2021   PAF (paroxysmal atrial fibrillation) ([place]) 11/03/2020   Stage 3b chronic kidney disease ([place]) 05/06/2020   Spinal stenosis in cervical  region 05/12/2016   Therapeutic opioid-induced constipation ([facility]) 01/01/2016   Chronic idiopathic constipation 09/11/2013   Lumbosacral spondylosis without myelopathy 04/09/2013   Postlaminectomy syndrome, lumbar region 04/09/2013   Anticoagulation management encounter 02/01/2012   Neuropathy, peripheral 08/04/2011   Pure hypercholesterolemia 08/04/2011   Essential hypertension, benign 08/04/2011   Hypothyroidism 08/04/2011   DJD (degenerative joint disease) of knee 08/04/2011   Gout 08/04/2011      THERAPY DIAG:  Cervicalgia  Other abnormalities of gait and mobility  Muscle weakness (generalized)  Abnormal posture  Chronic bilateral low back pain without sciatica  PCP: Janith Lima. MD   REFERRING PROVIDER: [name], MD   REFERRING DIAG: Z98.1 (ICD-10-CM) - S/P cervical spinal fusion    Rationale for Evaluation and Treatment Rehabilitation   ONSET DATE: Dec 2022   SUBJECTIVE:  SUBJECTIVE STATEMENT: She indicated no pain complaints today.  No other specific reports from the weekend.  She reported fatigue after last visit.    PERTINENT HISTORY:  History of 10/19/21 C3-4 C4-5 C5-6 ACDF with soft collar PMH bracial neuritis, abnormal gait, Degeneration of lumbar, HTN, gouty arthropathy, OA, lumbar laminectomy 2011, Rt TKA   PAIN:  No pain upon arrival.    PRECAUTIONS: None   WEIGHT BEARING RESTRICTIONS No   FALLS:  Has patient fallen in last 6 months? Maybe 1 fall in last 6 months.  Several falls within last year.    LIVING ENVIRONMENT: Lives with:lives alone Lives in: House/apartment Stairs: 3 steps to enter deck, rail on Lt going up.  No stairs in house.  Has following equipment at home: FWW, cane   OCCUPATION: retired    PLOF: Independent  ,  cooking/cleaning, shopping/grocery store   [place] stronger, walking independently.     OBJECTIVE:    PATIENT SURVEYS:  01/25/2022 No foto - incorrect set up    COGNITION: 01/25/2022 Overall cognitive status: Within functional limits for tasks assessed     SENSATION: 01/25/2022 not tested today   POSTURE:  01/25/2022 rounded shoulders, forward head, increased thoracic kyphosis, and flexed trunk    PALPATION: 01/25/2022 no specific tenderness noted in screening for balance.       MMT: 01/25/2022:  Seated resisted static testing hip abduction Rt: strong/painless. Lt moderate strength, painless (held MMT due to difficulty in bed mobility positioning MMT Right 01/25/2022 Left 01/25/2022 Left 02/22/2022 Left 03/11/2022 Left 03/16/22 Left 04/01/2022 Left 04/05/22 Left/Right 05/24/22  Shoulder flexion            Shoulder extension            Shoulder abduction            Shoulder adduction            Shoulder extension            Shoulder internal rotation            Shoulder external rotation                                      Hip flexion 5/5 4/5 4/5 4/5 4/5 4+/5 4+ 4+/4+  Hip extension            Hip abduction            Knee flexion 5/5 5/5        Knee extension  5/5 5/5    4+/5 4+ 4+/4+  Ankle DF 5/5 4/5  4/5 4+/5      (Blank rows = not tested)   CERVICAL SPECIAL TESTS:  01/25/2022 none performed   FUNCTIONAL TESTS:    06/24/22 0001  Berg Balance Test  Sit to Stand 3  Standing Unsupported 3  Sitting with Back Unsupported but Feet Supported on Floor or Stool 4  Stand to Sit 3  Transfers 3  Standing Unsupported with Eyes Closed 3  Standing Unsupported with Feet Together 3  From Standing, Reach Forward with Outstretched Arm 3  From Standing Position, Pick up Object from Floor 3  From Standing Position, Turn to Look Behind Over each Shoulder 2  Turn 360 Degrees 1  Standing Unsupported, Alternately Place Feet on Step/Stool 0  Standing Unsupported, One  Foot in Front 2  Standing on One Leg 0  Total  Score 33  (previously 21 on 01/25/2022     06/03/2022:  TUG c FWW:  34.95 seconds, 32.84 seconds  05/24/22: TUG c FWW:  36 seconds  05/06/2022: TUG c FWW:  37 seconds at end of session  04/05/22 TUG 38.5s seconds with FWW 03/22/22: TUG 44 seconds C FWW (done at very end of session) 03/08/2022:   TUG:  41.76 seconds c FWW 01/25/2022        TUG:   71 seconds c FWW       [facility] PT Assessment - 01/25/22 0001                Standardized Balance Assessment    Standardized Balance Assessment Berg Balance Test          Berg Balance Test    Sit to Stand Able to stand using hands after several tries     Standing Unsupported Able to stand 2 minutes with supervision     Sitting with Back Unsupported but Feet Supported on Floor or Stool Able to sit safely and securely 2 minutes     Stand to Sit Uses backs of legs against chair to control descent     Transfers Able to transfer safely, definite need of hands     Standing Unsupported with Eyes Closed Able to stand 3 seconds     Standing Unsupported with Feet Together Needs help to attain position but able to stand for 30 seconds with feet together     From Standing, Reach Forward with Outstretched Arm Can reach forward >5 cm safely (2")     From Standing Position, Pick up Object from Floor Unable to pick up and needs supervision     From Standing Position, Turn to Look Behind Over each Shoulder Needs assist to keep from losing balance and falling     Turn 360 Degrees Needs assistance while turning     Standing Unsupported, Alternately Place Feet on Step/Stool Needs assistance to keep from falling or unable to try     Standing Unsupported, One Foot in Front Needs help to step but can hold 15 seconds     Standing on One Leg Unable to try or needs assist to prevent fall     Total Score 21                GAIT ASSESSMENT: 07/09/2022:  Ambulation mod independent c FWW with marked reduced gait speed,  limited in distance due to fatigue (household distances).  Able to perform ambulation c SPC c CGA to occasional SBA within clinic distances < 100 ft.   03/18/2022:  FWW into clinic c mod independent, decreased gait speed.  SPC use in Rt UE c CGA 33 ft today c cues for sequencing.    TODAY'S TREATMENT:  07/12/2022: Therex: Recumbent bike seat # 8 Lvl 1-2 8 mins  Leg press -single leg 37 lbs 2 x 15 bilateral with clinician guarding of hyperextension   TherActivity: Ambulation in clinic c CGA c SPC in Rt UE several distances on level surfaces:  distances include 30 ft,  153f, 50 ft.  FWW 100 ft modified independence.   Ramp and curb stepping (2 inch x 2, 4 inch heights) c FWW c CGA c light verbal cues for walker placement for safety 2 rounds  Stair navigation training within clinic to mimic household stair (2 stairs c Lt handrail ascending).  Performed 2 rounds with use of SPC and CGA c verbal cues for sequencing.  Performed 2 rounds  with use of FWW folded in Rt hand going up and Lt going down.  CGA primarily.    07/08/2022: Therex: Recumbent bike seat # 8 Lvl 1-2 10 mins  Leg press - double leg 75 lbs 2x15, single leg 37 lbs 2 x 12 bilateral with clinician guarding of hyperextension Seated LAQ  seated on airex pad, bilateral 3 lbs 2 x 15 bilateral   TherActivity: Ambulation in clinic c FWW 100 ft x 2 c supervision.  Ambulation c SPC in Rt UE 60 ft, 80 ft c elevator ride.  FWW ambulation out of clinic into parking lot 100 ft c supervision for car transfer. Gait belt present with cane usage.   Stair navigation training within clinic to mimic household stair (2 stairs c Lt handrail ascending).  Performed 2 rounds with use of SPC and CGA c verbal cues for sequencing.  Performed 2 rounds with use of FWW folded in Rt hand going up and Lt going down.  CGA primarily with one instance of moderate assist required to prevent loss of balance.  Improved tolerance and techniques were self reported by patient  compared to last visit.   07/01/2022: Therex: Recumbent bike seat # 8 Lvl 1-2 10 mins  Leg press - double leg 75 lbs 2x15, single leg 37 lbs 2 x 12 bilateral with clinician guarding of hyperextension Seated LAQ  seated on airex pad, bilateral 3 lbs 2 x 15 bilateral    TherActivity: Ambulation in clinic between activities c SPC use in Rt UE, gait belt c CGA.   Distances of 75 feet, 30 feet  X2.  Ambulation with RW 75 feet X 2 Stairs in clinic hall with gait belt and CGA for  2 steps using one Handrail  on the Right, step to pattern down leading with left and up leading with Rt Using RW in left hand that was folded up. Pefrormed 2 more steps using Rt handrail going up/down lateral holding RW around wrists   PATIENT EDUCATION:  05/06/2022 Education details: HEP update Person educated: Patient Education method: Consulting civil engineer, Demonstration, Verbal cues, and Handouts Education comprehension: verbalized understanding, returned demonstration, and verbal cues required     HOME EXERCISE PROGRAM: Access Code: [email or web address] URL: [email or web address] Date: 05/06/2022 Prepared by: Scot Jun  Exercises - Seated March  - 1-2 x daily - 7 x weekly - 1-2 sets - 10 reps - Sit to Stand  - 2-3 x daily - 7 x weekly - 1 sets - 5-10 reps - Seated Long Arc Quad  - 2-3 x daily - 7 x weekly - 1-2 sets - 10 reps - 2 hold - Standing Scapular Retraction  - 5 x daily - 7 x weekly - 1 sets - 5 reps - 5 second hold - Heel Toe Raises with Counter Support  - 1-2 x daily - 7 x weekly - 1-2 sets - 10 reps - Standing Tandem Balance with Counter Support  - 1-2 x daily - 7 x weekly - 1 sets - 10 reps - Seated Straight Leg Heel Taps  - 1-2 x daily - 7 x weekly - 1-2 sets - 5-10 reps - Seated Quad Set (Mirrored)  - 1-2 x daily - 7 x weekly - 1 sets - 10 reps - 5 hold - Small Range Straight Leg Raise  - 1-2 x daily - 7 x weekly - 1-2 sets - 10-15 reps   ASSESSMENT:   CLINICAL IMPRESSION:  She showed  improvement in knowledge of sequencing and placement of  assistive devices on 2 step trials. Still showing necessity for CGA at minimal assistance level in guarding with activity (cane or FWW).  Medical necessity for continued skilled PT services indicated to improve to mod independent navigation in/out of house.   OBJECTIVE IMPAIRMENTS Abnormal gait, decreased activity tolerance, decreased balance, decreased endurance, decreased mobility, difficulty walking, decreased strength, impaired perceived functional ability, impaired flexibility, improper body mechanics, postural dysfunction, and pain.    ACTIVITY LIMITATIONS carrying, lifting, bending, standing, squatting, stairs, transfers, bed mobility, reach over head, and locomotion level   PARTICIPATION LIMITATIONS: meal prep, cleaning, interpersonal relationship, driving, shopping, and community activity   PERSONAL FACTORS   History of 10/19/21 C3-4 C4-5 C5-6 ACDF with soft collar PMH bracial neuritis, abnormal gait, Degeneration of lumbar, HTN, gouty arthropathy, OA, lumbar laminectomy 2011, Rt TKA  are also affecting patient's functional outcome.    REHAB POTENTIAL: Good   CLINICAL DECISION MAKING: Stable/uncomplicated   EVALUATION COMPLEXITY: Low     GOALS: Goals reviewed with patient? Yes   Short term PT Goals (target date for Short term goals are 4 weeks 06/23/22) Patient will demonstrate independent use of home exercise program to maintain progress from in clinic treatments. Goal status: MET   Long term PT goals (target dates for all long term goals are 10 weeks  07/21/2022 )   1. Patient will demonstrate/report pain at worst less than or equal to 2/10 to facilitate minimal limitation in daily activity secondary to pain symptoms. Goal status: MET 04/05/22   2. Patient will demonstrate independent use of home exercise program to facilitate ability to maintain/progress functional gains from skilled physical therapy services. Goal status:  MET 05/24/22   3. Patient will demonstrate bilateral hip MMT 5/5 throughout to facilitate improved transfers, ambulation towards independence.  Goal status:on going 06/24/2022   4.  Patient will demonstrate BERG testing > or = 45 to indicate reduced fall risk.  Goal status: on going 06/24/2022   5.  Patient will demonstrate TUG c LRAD < or = 20 seconds to indicate reduced fall risk/ improved community ambulation.    Goal status: on going 06/24/2022   6.  Patient will demonstrate ability to ambulation c LRAD (SPC, independent) community distances > 300 ft.   Goal status: on going 06/24/2022       PLAN: PT FREQUENCY: 1-2x/week   PT DURATION: 6 more  weeks from recert   PLANNED INTERVENTIONS: Therapeutic exercises, Therapeutic activity, Neuro Muscular re-education, Balance training, Gait training, Patient/Family education, Joint mobilization, Stair training, DME instructions, Dry Needling, Electrical stimulation, Cryotherapy, Moist heat, Taping, Ultrasound, Ionotophoresis '4mg'$ /ml Dexamethasone, and Manual therapy.  All included unless contraindicated   PLAN FOR NEXT SESSION:  Landscape architect.  Strengthening.  Recert upcoming around  07/19/2022  Scot Jun, PT, DPT, OCS, ATC 07/12/22  2:04 PM

## 2022-07-13 ENCOUNTER — Other Ambulatory Visit: Payer: Self-pay | Admitting: Internal Medicine

## 2022-07-13 DIAGNOSIS — M47817 Spondylosis without myelopathy or radiculopathy, lumbosacral region: Secondary | ICD-10-CM

## 2022-07-13 DIAGNOSIS — M961 Postlaminectomy syndrome, not elsewhere classified: Secondary | ICD-10-CM

## 2022-07-13 DIAGNOSIS — M170 Bilateral primary osteoarthritis of knee: Secondary | ICD-10-CM

## 2022-07-15 ENCOUNTER — Encounter: Payer: PPO | Admitting: Rehabilitative and Restorative Service Providers"

## 2022-07-16 ENCOUNTER — Other Ambulatory Visit: Payer: Self-pay | Admitting: Cardiovascular Disease

## 2022-07-16 DIAGNOSIS — I480 Paroxysmal atrial fibrillation: Secondary | ICD-10-CM

## 2022-07-16 MED ORDER — RIVAROXABAN 15 MG PO TABS: 15.0000 mg | ORAL_TABLET | Freq: Every day | ORAL | 5 refills | Status: DC

## 2022-07-16 NOTE — Telephone Encounter (Signed)
Please review for refill. Thank you! 

## 2022-07-16 NOTE — Telephone Encounter (Signed)
Xarelto '20mg'$  refill request received. Pt is 89 years old, weight-74.8kg, Crea-1.05 on 06/14/2022, last seen by Dr. Oval Linsey on 09/07/2021, Diagnosis-Afib, CrCl-45.41 mL/min; Dose is INAPPROPRIATE for '20mg'$  xarelto based on dosing criteria. Also, on 05/18/22 dose was changed per Pharm D note.   On 05/18/22 Xarelto '15mg'$  qd was sent to pharmacy as dose decreased was noted on refill. Will send in Xarelto '15mg'$  refill to requested pharmacy.

## 2022-07-21 ENCOUNTER — Encounter: Payer: Self-pay | Admitting: Physical Therapy

## 2022-07-21 ENCOUNTER — Ambulatory Visit (INDEPENDENT_AMBULATORY_CARE_PROVIDER_SITE_OTHER): Payer: PPO | Admitting: Physical Therapy

## 2022-07-21 DIAGNOSIS — R293 Abnormal posture: Secondary | ICD-10-CM

## 2022-07-21 DIAGNOSIS — M6281 Muscle weakness (generalized): Secondary | ICD-10-CM | POA: Diagnosis not present

## 2022-07-21 DIAGNOSIS — G8929 Other chronic pain: Secondary | ICD-10-CM | POA: Diagnosis not present

## 2022-07-21 DIAGNOSIS — M545 Low back pain, unspecified: Secondary | ICD-10-CM | POA: Diagnosis not present

## 2022-07-21 DIAGNOSIS — R2689 Other abnormalities of gait and mobility: Secondary | ICD-10-CM | POA: Diagnosis not present

## 2022-07-21 DIAGNOSIS — M542 Cervicalgia: Secondary | ICD-10-CM

## 2022-07-21 NOTE — Therapy (Signed)
OUTPATIENT PHYSICAL THERAPY TREATMENT NOTE/RECERT/PROGRESS note Progress Note reporting period date to date  See below for objective and subjective measurements relating to patients progress with PT.     Patient Name: Claudia [name] MRN: [id number] DOB:1933/11/18, 89 y.o., female Today's Date: 07/21/2022   END OF SESSION:   PT End of Session - 07/21/22 1304     Visit Number 33    Number of Visits 45    Date for PT Re-Evaluation 09/15/22    Authorization Type [employer] $15 copay    Progress Note Due on Visit 55   did last one at #33   PT Start Time 1300    PT Stop Time 1345    PT Time Calculation (min) 45 min    Equipment Utilized During Treatment Gait belt    Activity Tolerance Patient tolerated treatment well    Behavior During Therapy WFL for tasks assessed/performed              Past Medical History:  Diagnosis Date   Abnormality of gait    Brachial neuritis or radiculitis NOS    Degeneration of lumbar or lumbosacral intervertebral disc    Dysrhythmia    Essential hypertension, benign    Gouty arthropathy    Lumbago    Obesity    Osteoarthritis    Pure hypercholesterolemia    Unspecified hypothyroidism    Past Surgical History:  Procedure Laterality Date   ABDOMINAL HYSTERECTOMY  03/13/2010   ANTERIOR CERVICAL DECOMP/DISCECTOMY FUSION N/A 10/19/2021   Procedure: C3-4, C4-5, C5-6 ANTERIOR CERVICAL DISCECTOMY FUSION, ALLOGRAFT, PLATE;  Surgeon: [name], MD;  Location: [facility];  Service: Orthopedics;  Laterality: N/A;   BACK SURGERY     LUMBAR LAMINECTOMY  03/13/2010   right knee replacement  03/13/2010   Patient Active Problem List   Diagnosis Date Noted   Vitamin D deficiency disease 06/21/2022   Encounter for general adult medical examination with abnormal findings 06/21/2022   Vitamin B12 deficiency neuropathy ([place]) 02/15/2022   Hypercalcemia 02/15/2022   Cervical stricture or stenosis 10/20/2021   Cervical spinal stenosis 10/19/2021    Meningioma ([place]) 08/24/2021   PAF (paroxysmal atrial fibrillation) ([place]) 11/03/2020   Stage 3b chronic kidney disease ([place]) 05/06/2020   Spinal stenosis in cervical region 05/12/2016   Therapeutic opioid-induced constipation ([facility]) 01/01/2016   Chronic idiopathic constipation 09/11/2013   Lumbosacral spondylosis without myelopathy 04/09/2013   Postlaminectomy syndrome, lumbar region 04/09/2013   Anticoagulation management encounter 02/01/2012   Neuropathy, peripheral 08/04/2011   Pure hypercholesterolemia 08/04/2011   Essential hypertension, benign 08/04/2011   Hypothyroidism 08/04/2011   DJD (degenerative joint disease) of knee 08/04/2011   Gout 08/04/2011      THERAPY DIAG:  Cervicalgia  Other abnormalities of gait and mobility  Muscle weakness (generalized)  Abnormal posture  Chronic bilateral low back pain without sciatica  PCP: Janith Lima. MD   REFERRING PROVIDER: [name], MD   REFERRING DIAG: Z98.1 (ICD-10-CM) - S/P cervical spinal fusion    Rationale for Evaluation and Treatment Rehabilitation   ONSET DATE: Dec 2022   SUBJECTIVE:  SUBJECTIVE STATEMENT: She denies pain, she feels she is progressing and still hopes to become more independent and feels she needs to continue with PT.   PERTINENT HISTORY:  History of 10/19/21 C3-4 C4-5 C5-6 ACDF with soft collar PMH bracial neuritis, abnormal gait, Degeneration of lumbar, HTN, gouty arthropathy, OA, lumbar laminectomy 2011, Rt TKA   PAIN:  No pain upon arrival.    PRECAUTIONS: None   WEIGHT BEARING RESTRICTIONS No   FALLS:  Has patient fallen in last 6 months? Maybe 1 fall in last 6 months.  Several falls within last year.    LIVING ENVIRONMENT: Lives with:lives alone Lives in:  House/apartment Stairs: 3 steps to enter deck, rail on Lt going up.  No stairs in house.  Has following equipment at home: FWW, cane   OCCUPATION: retired    PLOF: Independent  , cooking/cleaning, shopping/grocery store   [place] stronger, walking independently.     OBJECTIVE:    PATIENT SURVEYS:  01/25/2022 No foto - incorrect set up    COGNITION: 01/25/2022 Overall cognitive status: Within functional limits for tasks assessed     SENSATION: 01/25/2022 not tested today   POSTURE:  01/25/2022 rounded shoulders, forward head, increased thoracic kyphosis, and flexed trunk    PALPATION: 01/25/2022 no specific tenderness noted in screening for balance.       MMT: 01/25/2022:  Seated resisted static testing hip abduction Rt: strong/painless. Lt moderate strength, painless (held MMT due to difficulty in bed mobility positioning MMT Right 01/25/2022 Left 01/25/2022 Left 02/22/2022 Left 03/11/2022 Left 03/16/22 Left 04/01/2022 Left 04/05/22 Left/Right 05/24/22 Left/Right 07/21/22  Shoulder flexion             Shoulder extension             Shoulder abduction             Shoulder adduction             Shoulder extension             Shoulder internal rotation             Shoulder external rotation                                         Hip flexion 5/5 4/5 4/5 4/5 4/5 4+/5 4+ 4+/4+ 4+/4+  Hip extension             Hip abduction             Knee flexion 5/5 5/5         Knee extension  5/5 5/5    4+/5 4+ 4+/4+ 4+/4+  Ankle DF 5/5 4/5  4/5 4+/5    5/5   (Blank rows = not tested)   CERVICAL SPECIAL TESTS:  01/25/2022 none performed   FUNCTIONAL TESTS:   07/21/22 TUG with FWW:  06/03/2022:  TUG c FWW:  34.95 seconds, 32.84 seconds  05/24/22: TUG c FWW:  36 seconds  05/06/2022: TUG c FWW:  37 seconds at end of session  04/05/22 TUG 38.5s seconds with FWW 03/22/22: TUG 44 seconds C FWW (done at very end of session) 03/08/2022:   TUG:  41.76 seconds c FWW 01/25/2022         TUG:   71 seconds c FWW      [facility] PT Assessment - 07/21/22 0001  Berg Balance Test   Sit to Stand Able to stand  independently using hands    Standing Unsupported Able to stand safely 2 minutes    Sitting with Back Unsupported but Feet Supported on Floor or Stool Able to sit safely and securely 2 minutes    Stand to Sit Controls descent by using hands    Transfers Able to transfer safely, definite need of hands    Standing Unsupported with Eyes Closed Able to stand 10 seconds with supervision    Standing Unsupported with Feet Together Able to place feet together independently and stand 1 minute safely    From Standing, Reach Forward with Outstretched Arm Can reach forward >12 cm safely (5")    From Standing Position, Pick up Object from Floor Able to pick up shoe, needs supervision    From Standing Position, Turn to Look Behind Over each Shoulder Looks behind one side only/other side shows less weight shift    Turn 360 Degrees Needs close supervision or verbal cueing    Standing Unsupported, Alternately Place Feet on Step/Stool Needs assistance to keep from falling or unable to try    Standing Unsupported, One Foot in Front Able to take small step independently and hold 30 seconds    Standing on One Leg Unable to try or needs assist to prevent fall    Total Score 36             06/24/22 0001  Berg Balance Test  Sit to Stand 3  Standing Unsupported 3  Sitting with Back Unsupported but Feet Supported on Floor or Stool 4  Stand to Sit 3  Transfers 3  Standing Unsupported with Eyes Closed 3  Standing Unsupported with Feet Together 3  From Standing, Reach Forward with Outstretched Arm 3  From Standing Position, Pick up Object from Floor 3  From Standing Position, Turn to Look Behind Over each Shoulder 2  Turn 360 Degrees 1  Standing Unsupported, Alternately Place Feet on Step/Stool 0  Standing Unsupported, One Foot in Front 2  Standing on One Leg 0  Total Score 33   (previously 21 on 01/25/2022       [facility] PT Assessment - 01/25/22 0001                Standardized Balance Assessment    Standardized Balance Assessment Berg Balance Test          Berg Balance Test    Sit to Stand Able to stand using hands after several tries     Standing Unsupported Able to stand 2 minutes with supervision     Sitting with Back Unsupported but Feet Supported on Floor or Stool Able to sit safely and securely 2 minutes     Stand to Sit Uses backs of legs against chair to control descent     Transfers Able to transfer safely, definite need of hands     Standing Unsupported with Eyes Closed Able to stand 3 seconds     Standing Unsupported with Feet Together Needs help to attain position but able to stand for 30 seconds with feet together     From Standing, Reach Forward with Outstretched Arm Can reach forward >5 cm safely (2")     From Standing Position, Pick up Object from Floor Unable to pick up and needs supervision     From Standing Position, Turn to Look Behind Over each Shoulder Needs assist to keep from losing balance and falling     Turn  360 Degrees Needs assistance while turning     Standing Unsupported, Alternately Place Feet on Step/Stool Needs assistance to keep from falling or unable to try     Standing Unsupported, One Foot in Front Needs help to step but can hold 15 seconds     Standing on One Leg Unable to try or needs assist to prevent fall     Total Score 21                GAIT ASSESSMENT: 07/09/2022:  Ambulation mod independent c FWW with marked reduced gait speed, limited in distance due to fatigue (household distances).  Able to perform ambulation c SPC c CGA to occasional SBA within clinic distances < 100 ft.   03/18/2022:  FWW into clinic c mod independent, decreased gait speed.  SPC use in Rt UE c CGA 33 ft today c cues for sequencing.    TODAY'S TREATMENT:  07/21/22 -Recumbent bike L3-2 X 8 min -TUG testing X2, MMT, BERG balance testing, see  above for details -Treadmill 0.5 MPH X 3 min with UE support bilat -up/down 3 stairs using one handrail and SPC with CGA/supervision -Ambulation 200 feet from clinic out to her car and independently putting RW into car with supervision  07/12/2022: Therex: Recumbent bike seat # 8 Lvl 1-2 8 mins  Leg press -single leg 37 lbs 2 x 15 bilateral with clinician guarding of hyperextension   TherActivity: Ambulation in clinic c CGA c SPC in Rt UE several distances on level surfaces:  distances include 30 ft,  133f, 50 ft.  FWW 100 ft modified independence.   Ramp and curb stepping (2 inch x 2, 4 inch heights) c FWW c CGA c light verbal cues for walker placement for safety 2 rounds  Stair navigation training within clinic to mimic household stair (2 stairs c Lt handrail ascending).  Performed 2 rounds with use of SPC and CGA c verbal cues for sequencing.  Performed 2 rounds with use of FWW folded in Rt hand going up and Lt going down.  CGA primarily.     PATIENT EDUCATION:  05/06/2022 Education details: HEP update Person educated: Patient Education method: EConsulting civil engineer Demonstration, Verbal cues, and Handouts Education comprehension: verbalized understanding, returned demonstration, and verbal cues required     HOME EXERCISE PROGRAM: Access Code: [email or web address]: [email or web address] Date: 05/06/2022 Prepared by: MScot Jun Exercises - Seated March  - 1-2 x daily - 7 x weekly - 1-2 sets - 10 reps - Sit to Stand  - 2-3 x daily - 7 x weekly - 1 sets - 5-10 reps - Seated Long Arc Quad  - 2-3 x daily - 7 x weekly - 1-2 sets - 10 reps - 2 hold - Standing Scapular Retraction  - 5 x daily - 7 x weekly - 1 sets - 5 reps - 5 second hold - Heel Toe Raises with Counter Support  - 1-2 x daily - 7 x weekly - 1-2 sets - 10 reps - Standing Tandem Balance with Counter Support  - 1-2 x daily - 7 x weekly - 1 sets - 10 reps - Seated Straight Leg Heel Taps  - 1-2 x daily - 7 x weekly - 1-2  sets - 5-10 reps - Seated Quad Set (Mirrored)  - 1-2 x daily - 7 x weekly - 1 sets - 10 reps - 5 hold - Small Range Straight Leg Raise  - 1-2 x daily - 7 x weekly - 1-2 sets -  10-15 reps   ASSESSMENT:   CLINICAL IMPRESSION:  Progress note reflects she is making functional progress in gait, leg strength, ambulation, stairs, and balance. She does still have significant deficits in this area and is not yet able to go up/down her stairs independently at home or walk without her RW independently. She has not yet met her PT goas and PT feels it remains Medical necessity for continued skilled PT services indicated to improve to mod independent navigation in/out of house.   OBJECTIVE IMPAIRMENTS Abnormal gait, decreased activity tolerance, decreased balance, decreased endurance, decreased mobility, difficulty walking, decreased strength, impaired perceived functional ability, impaired flexibility, improper body mechanics, postural dysfunction, and pain.    ACTIVITY LIMITATIONS carrying, lifting, bending, standing, squatting, stairs, transfers, bed mobility, reach over head, and locomotion level   PARTICIPATION LIMITATIONS: meal prep, cleaning, interpersonal relationship, driving, shopping, and community activity   PERSONAL FACTORS   History of 10/19/21 C3-4 C4-5 C5-6 ACDF with soft collar PMH bracial neuritis, abnormal gait, Degeneration of lumbar, HTN, gouty arthropathy, OA, lumbar laminectomy 2011, Rt TKA  are also affecting patient's functional outcome.    REHAB POTENTIAL: Good   CLINICAL DECISION MAKING: Stable/uncomplicated   EVALUATION COMPLEXITY: Low     GOALS: Goals reviewed with patient? Yes   Short term PT Goals (target date for Short term goals are 4 weeks 06/23/22) Patient will demonstrate independent use of home exercise program to maintain progress from in clinic treatments. Goal status: MET   Long term PT goals (target dates for all long term goals are 10 weeks  08/28/2022 )   1.  Patient will demonstrate/report pain at worst less than or equal to 2/10 to facilitate minimal limitation in daily activity secondary to pain symptoms. Goal status: MET 04/05/22   2. Patient will demonstrate independent use of home exercise program to facilitate ability to maintain/progress functional gains from skilled physical therapy services. Goal status: MET 05/24/22   3. Patient will demonstrate bilateral hip MMT 5/5 throughout to facilitate improved transfers, ambulation towards independence.  Goal status:on going 06/24/2022   4.  Patient will demonstrate BERG testing > or = 45 to indicate reduced fall risk.  Goal status: on going 06/24/2022   5.  Patient will demonstrate TUG c LRAD < or = 20 seconds to indicate reduced fall risk/ improved community ambulation.    Goal status: on going 07/21/22 37 sec    6.  Patient will demonstrate ability to ambulation c LRAD (SPC, independent) community distances > 300 ft.   Goal status: on going, can do this with RW but not yet with [facility] 07/21/22      PLAN: PT FREQUENCY: 1-2x/week   PT DURATION: 6 more  weeks from recert   PLANNED INTERVENTIONS: Therapeutic exercises, Therapeutic activity, Neuro Muscular re-education, Balance training, Gait training, Patient/Family education, Joint mobilization, Stair training, DME instructions, Dry Needling, Electrical stimulation, Cryotherapy, Moist heat, Taping, Ultrasound, Ionotophoresis '4mg'$ /ml Dexamethasone, and Manual therapy.  All included unless contraindicated   PLAN FOR NEXT SESSION:  Landscape architect.  Strengthening.  Treadmill, gait with SPC, balance  Elsie Ra, PT, DPT 07/21/22 3:17 PM

## 2022-07-22 ENCOUNTER — Encounter: Payer: PPO | Admitting: Rehabilitative and Restorative Service Providers"

## 2022-07-25 DIAGNOSIS — M47817 Spondylosis without myelopathy or radiculopathy, lumbosacral region: Secondary | ICD-10-CM | POA: Diagnosis not present

## 2022-07-26 ENCOUNTER — Encounter: Payer: Self-pay | Admitting: Rehabilitative and Restorative Service Providers"

## 2022-07-26 ENCOUNTER — Ambulatory Visit: Payer: PPO | Admitting: Rehabilitative and Restorative Service Providers"

## 2022-07-26 DIAGNOSIS — M542 Cervicalgia: Secondary | ICD-10-CM

## 2022-07-26 DIAGNOSIS — R293 Abnormal posture: Secondary | ICD-10-CM

## 2022-07-26 DIAGNOSIS — G8929 Other chronic pain: Secondary | ICD-10-CM | POA: Diagnosis not present

## 2022-07-26 DIAGNOSIS — R2689 Other abnormalities of gait and mobility: Secondary | ICD-10-CM

## 2022-07-26 DIAGNOSIS — M545 Low back pain, unspecified: Secondary | ICD-10-CM | POA: Diagnosis not present

## 2022-07-26 DIAGNOSIS — M6281 Muscle weakness (generalized): Secondary | ICD-10-CM

## 2022-07-26 NOTE — Therapy (Signed)
OUTPATIENT PHYSICAL THERAPY TREATMENT NOTE/RECERT/PROGRESS note Progress Note reporting period date to date  See below for objective and subjective measurements relating to patients progress with PT.     Patient Name: Claudia [name] MRN: [id number] DOB:31-Dec-1933, 89 y.o., female Today's Date: 07/26/2022   END OF SESSION:   PT End of Session - 07/26/22 1258     Visit Number 34    Number of Visits 45    Date for PT Re-Evaluation 09/15/22    Authorization Type [employer] $15 copay    Progress Note Due on Visit 21   did last one at #33   PT Start Time 1258    PT Stop Time 1338    PT Time Calculation (min) 40 min    Equipment Utilized During Treatment Gait belt    Activity Tolerance Patient tolerated treatment well    Behavior During Therapy WFL for tasks assessed/performed               Past Medical History:  Diagnosis Date   Abnormality of gait    Brachial neuritis or radiculitis NOS    Degeneration of lumbar or lumbosacral intervertebral disc    Dysrhythmia    Essential hypertension, benign    Gouty arthropathy    Lumbago    Obesity    Osteoarthritis    Pure hypercholesterolemia    Unspecified hypothyroidism    Past Surgical History:  Procedure Laterality Date   ABDOMINAL HYSTERECTOMY  03/13/2010   ANTERIOR CERVICAL DECOMP/DISCECTOMY FUSION N/A 10/19/2021   Procedure: C3-4, C4-5, C5-6 ANTERIOR CERVICAL DISCECTOMY FUSION, ALLOGRAFT, PLATE;  Surgeon: [name], MD;  Location: [facility];  Service: Orthopedics;  Laterality: N/A;   BACK SURGERY     LUMBAR LAMINECTOMY  03/13/2010   right knee replacement  03/13/2010   Patient Active Problem List   Diagnosis Date Noted   Vitamin D deficiency disease 06/21/2022   Encounter for general adult medical examination with abnormal findings 06/21/2022   Vitamin B12 deficiency neuropathy ([place]) 02/15/2022   Hypercalcemia 02/15/2022   Cervical stricture or stenosis 10/20/2021   Cervical spinal stenosis 10/19/2021    Meningioma ([place]) 08/24/2021   PAF (paroxysmal atrial fibrillation) ([place]) 11/03/2020   Stage 3b chronic kidney disease ([place]) 05/06/2020   Spinal stenosis in cervical region 05/12/2016   Therapeutic opioid-induced constipation ([facility]) 01/01/2016   Chronic idiopathic constipation 09/11/2013   Lumbosacral spondylosis without myelopathy 04/09/2013   Postlaminectomy syndrome, lumbar region 04/09/2013   Anticoagulation management encounter 02/01/2012   Neuropathy, peripheral 08/04/2011   Pure hypercholesterolemia 08/04/2011   Essential hypertension, benign 08/04/2011   Hypothyroidism 08/04/2011   DJD (degenerative joint disease) of knee 08/04/2011   Gout 08/04/2011      THERAPY DIAG:  Cervicalgia  Other abnormalities of gait and mobility  Muscle weakness (generalized)  Abnormal posture  Chronic bilateral low back pain without sciatica  PCP: Janith Lima. MD   REFERRING PROVIDER: [name], MD   REFERRING DIAG: Z98.1 (ICD-10-CM) - S/P cervical spinal fusion    Rationale for Evaluation and Treatment Rehabilitation   ONSET DATE: Dec 2022   SUBJECTIVE:  SUBJECTIVE STATEMENT: She indicated using cane to go up/down the stairs and has walker in car and in house.  Still has someone watching her but not helping her do it.    PERTINENT HISTORY:  History of 10/19/21 C3-4 C4-5 C5-6 ACDF with soft collar PMH bracial neuritis, abnormal gait, Degeneration of lumbar, HTN, gouty arthropathy, OA, lumbar laminectomy 2011, Rt TKA   PAIN:  No pain upon arrival.    PRECAUTIONS: None   WEIGHT BEARING RESTRICTIONS No   FALLS:  Has patient fallen in last 6 months? Maybe 1 fall in last 6 months.  Several falls within last year.    LIVING ENVIRONMENT: Lives with:lives alone Lives in:  House/apartment Stairs: 3 steps to enter deck, rail on Lt going up.  No stairs in house.  Has following equipment at home: FWW, cane   OCCUPATION: retired    PLOF: Independent  , cooking/cleaning, shopping/grocery store   [place] stronger, walking independently.     OBJECTIVE:    PATIENT SURVEYS:  01/25/2022 No foto - incorrect set up    COGNITION: 01/25/2022 Overall cognitive status: Within functional limits for tasks assessed     SENSATION: 01/25/2022 not tested today   POSTURE:  01/25/2022 rounded shoulders, forward head, increased thoracic kyphosis, and flexed trunk    PALPATION: 01/25/2022 no specific tenderness noted in screening for balance.       MMT: 01/25/2022:  Seated resisted static testing hip abduction Rt: strong/painless. Lt moderate strength, painless (held MMT due to difficulty in bed mobility positioning MMT Right 01/25/2022 Left 01/25/2022 Left 02/22/2022 Left 03/11/2022 Left 03/16/22 Left 04/01/2022 Left 04/05/22 Left/Right 05/24/22 Left/Right 07/21/22  Shoulder flexion             Shoulder extension             Shoulder abduction             Shoulder adduction             Shoulder extension             Shoulder internal rotation             Shoulder external rotation                                         Hip flexion 5/5 4/5 4/5 4/5 4/5 4+/5 4+ 4+/4+ 4+/4+  Hip extension             Hip abduction             Knee flexion 5/5 5/5         Knee extension  5/5 5/5    4+/5 4+ 4+/4+ 4+/4+  Ankle DF 5/5 4/5  4/5 4+/5    5/5   (Blank rows = not tested)   CERVICAL SPECIAL TESTS:  01/25/2022 none performed   FUNCTIONAL TESTS:   07/21/22 TUG with FWW:  06/03/2022:  TUG c FWW:  34.95 seconds, 32.84 seconds  05/24/22: TUG c FWW:  36 seconds  05/06/2022: TUG c FWW:  37 seconds at end of session  04/05/22 TUG 38.5s seconds with FWW 03/22/22: TUG 44 seconds C FWW (done at very end of session) 03/08/2022:   TUG:  41.76 seconds c FWW 01/25/2022         TUG:   71 seconds c FWW        06/24/22 0001  Berg Balance Test  Sit to Stand 3  Standing Unsupported 3  Sitting with Back Unsupported but Feet Supported on Floor or Stool 4  Stand to Sit 3  Transfers 3  Standing Unsupported with Eyes Closed 3  Standing Unsupported with Feet Together 3  From Standing, Reach Forward with Outstretched Arm 3  From Standing Position, Pick up Object from Floor 3  From Standing Position, Turn to Look Behind Over each Shoulder 2  Turn 360 Degrees 1  Standing Unsupported, Alternately Place Feet on Step/Stool 0  Standing Unsupported, One Foot in Front 2  Standing on One Leg 0  Total Score 33  (previously 21 on 01/25/2022       [facility] PT Assessment - 01/25/22 0001                Standardized Balance Assessment    Standardized Balance Assessment Berg Balance Test          Berg Balance Test    Sit to Stand Able to stand using hands after several tries     Standing Unsupported Able to stand 2 minutes with supervision     Sitting with Back Unsupported but Feet Supported on Floor or Stool Able to sit safely and securely 2 minutes     Stand to Sit Uses backs of legs against chair to control descent     Transfers Able to transfer safely, definite need of hands     Standing Unsupported with Eyes Closed Able to stand 3 seconds     Standing Unsupported with Feet Together Needs help to attain position but able to stand for 30 seconds with feet together     From Standing, Reach Forward with Outstretched Arm Can reach forward >5 cm safely (2")     From Standing Position, Pick up Object from Floor Unable to pick up and needs supervision     From Standing Position, Turn to Look Behind Over each Shoulder Needs assist to keep from losing balance and falling     Turn 360 Degrees Needs assistance while turning     Standing Unsupported, Alternately Place Feet on Step/Stool Needs assistance to keep from falling or unable to try     Standing Unsupported, One Foot in  Front Needs help to step but can hold 15 seconds     Standing on One Leg Unable to try or needs assist to prevent fall     Total Score 21                GAIT ASSESSMENT: 07/09/2022:  Ambulation mod independent c FWW with marked reduced gait speed, limited in distance due to fatigue (household distances).  Able to perform ambulation c SPC c CGA to occasional SBA within clinic distances < 100 ft.   03/18/2022:  FWW into clinic c mod independent, decreased gait speed.  SPC use in Rt UE c CGA 33 ft today c cues for sequencing.    TODAY'S TREATMENT:  07/26/2022: Therex: Recumbent bike lvl 2 10 mins  Leg press double leg 81 lbs x 20, single leg 37 lbs 2 x 15 bilateral Seated LAQ 3 lbs x 20 bilateral   TherActivity:   SPC ramp up/down x 2 c CGA gait belt c sequencing cues and up to min A at times.  Ramp up/down c 6 inch step training c FWW and CGA x 2 c cues for sequencing of legs in movements.  Additional time required due to slower movement pattern speeds and to ensure safety.  SPC ambulation within clinic on level surfaces c CGA 40 ft. 50 ft   07/21/22 -Recumbent bike L3-2 X 8 min -TUG testing X2, MMT, BERG balance testing, see above for details -Treadmill 0.5 MPH X 3 min with UE support bilat -up/down 3 stairs using one handrail and SPC with CGA/supervision -Ambulation 200 feet from clinic out to her car and independently putting RW into car with supervision  07/12/2022: Therex: Recumbent bike seat # 8 Lvl 1-2 8 mins  Leg press -single leg 37 lbs 2 x 15 bilateral with clinician guarding of hyperextension   TherActivity: Ambulation in clinic c CGA c SPC in Rt UE several distances on level surfaces:  distances include 30 ft,  160f, 50 ft.  FWW 100 ft modified independence.   Ramp and curb stepping (2 inch x 2, 4 inch heights) c FWW c CGA c light verbal cues for walker placement for safety 2 rounds  Stair navigation training within clinic to mimic household stair (2 stairs c Lt  handrail ascending).  Performed 2 rounds with use of SPC and CGA c verbal cues for sequencing.  Performed 2 rounds with use of FWW folded in Rt hand going up and Lt going down.  CGA primarily.     PATIENT EDUCATION:  05/06/2022 Education details: HEP update Person educated: Patient Education method: EConsulting civil engineer Demonstration, Verbal cues, and Handouts Education comprehension: verbalized understanding, returned demonstration, and verbal cues required     HOME EXERCISE PROGRAM: Access Code: [email or web address]: [email or web address] Date: 05/06/2022 Prepared by: MScot Jun Exercises - Seated March  - 1-2 x daily - 7 x weekly - 1-2 sets - 10 reps - Sit to Stand  - 2-3 x daily - 7 x weekly - 1 sets - 5-10 reps - Seated Long Arc Quad  - 2-3 x daily - 7 x weekly - 1-2 sets - 10 reps - 2 hold - Standing Scapular Retraction  - 5 x daily - 7 x weekly - 1 sets - 5 reps - 5 second hold - Heel Toe Raises with Counter Support  - 1-2 x daily - 7 x weekly - 1-2 sets - 10 reps - Standing Tandem Balance with Counter Support  - 1-2 x daily - 7 x weekly - 1 sets - 10 reps - Seated Straight Leg Heel Taps  - 1-2 x daily - 7 x weekly - 1-2 sets - 5-10 reps - Seated Quad Set (Mirrored)  - 1-2 x daily - 7 x weekly - 1 sets - 10 reps - 5 hold - Small Range Straight Leg Raise  - 1-2 x daily - 7 x weekly - 1-2 sets - 10-15 reps   ASSESSMENT:   CLINICAL IMPRESSION:  Instability and fall risk continued with SPC mobility at this but slowly improving in confidence.  FWW ramp and curb navigation close to SBA level (CGA still performed today).  Cues for sequencing in movement patterns still necessary.  Medical necessity for continued skilled PT services indicated to continue towards goals of improved independence and safety.   OBJECTIVE IMPAIRMENTS Abnormal gait, decreased activity tolerance, decreased balance, decreased endurance, decreased mobility, difficulty walking, decreased strength, impaired  perceived functional ability, impaired flexibility, improper body mechanics, postural dysfunction, and pain.    ACTIVITY LIMITATIONS carrying, lifting, bending, standing, squatting, stairs, transfers, bed mobility, reach over head, and locomotion level   PARTICIPATION LIMITATIONS: meal prep, cleaning, interpersonal relationship, driving, shopping, and community activity   PERSONAL FACTORS   History of 10/19/21 C3-4 C4-5 C5-6  ACDF with soft collar PMH bracial neuritis, abnormal gait, Degeneration of lumbar, HTN, gouty arthropathy, OA, lumbar laminectomy 2011, Rt TKA  are also affecting patient's functional outcome.    REHAB POTENTIAL: Good   CLINICAL DECISION MAKING: Stable/uncomplicated   EVALUATION COMPLEXITY: Low     GOALS: Goals reviewed with patient? Yes   Short term PT Goals (target date for Short term goals are 4 weeks 06/23/22) Patient will demonstrate independent use of home exercise program to maintain progress from in clinic treatments. Goal status: MET   Long term PT goals (target dates for all long term goals are 10 weeks  08/28/2022 )   1. Patient will demonstrate/report pain at worst less than or equal to 2/10 to facilitate minimal limitation in daily activity secondary to pain symptoms. Goal status: MET 04/05/22   2. Patient will demonstrate independent use of home exercise program to facilitate ability to maintain/progress functional gains from skilled physical therapy services. Goal status: MET 05/24/22   3. Patient will demonstrate bilateral hip MMT 5/5 throughout to facilitate improved transfers, ambulation towards independence.  Goal status:on going 06/24/2022   4.  Patient will demonstrate BERG testing > or = 45 to indicate reduced fall risk.  Goal status: on going 06/24/2022   5.  Patient will demonstrate TUG c LRAD < or = 20 seconds to indicate reduced fall risk/ improved community ambulation.    Goal status: on going 07/21/22 37 sec    6.  Patient will  demonstrate ability to ambulation c LRAD (SPC, independent) community distances > 300 ft.   Goal status: on going, can do this with RW but not yet with [facility] 07/21/22      PLAN: PT FREQUENCY: 1-2x/week   PT DURATION: 6 more  weeks from recert   PLANNED INTERVENTIONS: Therapeutic exercises, Therapeutic activity, Neuro Muscular re-education, Balance training, Gait training, Patient/Family education, Joint mobilization, Stair training, DME instructions, Dry Needling, Electrical stimulation, Cryotherapy, Moist heat, Taping, Ultrasound, Ionotophoresis '4mg'$ /ml Dexamethasone, and Manual therapy.  All included unless contraindicated   PLAN FOR NEXT SESSION:  Treadmill alternate visits with bike, [facility] training, strengthening, stair navigation.   Scot Jun, PT, DPT, OCS, ATC 07/26/22  1:39 PM

## 2022-07-28 ENCOUNTER — Encounter: Payer: Self-pay | Admitting: Physical Therapy

## 2022-07-28 ENCOUNTER — Ambulatory Visit (INDEPENDENT_AMBULATORY_CARE_PROVIDER_SITE_OTHER): Payer: PPO | Admitting: Physical Therapy

## 2022-07-28 DIAGNOSIS — M6281 Muscle weakness (generalized): Secondary | ICD-10-CM

## 2022-07-28 DIAGNOSIS — R293 Abnormal posture: Secondary | ICD-10-CM

## 2022-07-28 DIAGNOSIS — M545 Low back pain, unspecified: Secondary | ICD-10-CM

## 2022-07-28 DIAGNOSIS — M542 Cervicalgia: Secondary | ICD-10-CM

## 2022-07-28 DIAGNOSIS — G8929 Other chronic pain: Secondary | ICD-10-CM

## 2022-07-28 DIAGNOSIS — R2689 Other abnormalities of gait and mobility: Secondary | ICD-10-CM

## 2022-07-28 NOTE — Therapy (Signed)
OUTPATIENT PHYSICAL THERAPY TREATMENT NOTE    Patient Name: Claudia [name] MRN: [id number] DOB:Mar 20, 1934, 89 y.o., female Today's Date: 07/28/2022   END OF SESSION:   PT End of Session - 07/28/22 1313     Visit Number 35    Number of Visits 45    Date for PT Re-Evaluation 09/15/22    Authorization Type [employer] $15 copay    Progress Note Due on Visit 47   did last one at #33   PT Start Time 1300    PT Stop Time 1342    PT Time Calculation (min) 42 min    Equipment Utilized During Treatment Gait belt    Activity Tolerance Patient tolerated treatment well    Behavior During Therapy WFL for tasks assessed/performed               Past Medical History:  Diagnosis Date   Abnormality of gait    Brachial neuritis or radiculitis NOS    Degeneration of lumbar or lumbosacral intervertebral disc    Dysrhythmia    Essential hypertension, benign    Gouty arthropathy    Lumbago    Obesity    Osteoarthritis    Pure hypercholesterolemia    Unspecified hypothyroidism    Past Surgical History:  Procedure Laterality Date   ABDOMINAL HYSTERECTOMY  03/13/2010   ANTERIOR CERVICAL DECOMP/DISCECTOMY FUSION N/A 10/19/2021   Procedure: C3-4, C4-5, C5-6 ANTERIOR CERVICAL DISCECTOMY FUSION, ALLOGRAFT, PLATE;  Surgeon: [name], MD;  Location: [facility];  Service: Orthopedics;  Laterality: N/A;   BACK SURGERY     LUMBAR LAMINECTOMY  03/13/2010   right knee replacement  03/13/2010   Patient Active Problem List   Diagnosis Date Noted   Vitamin D deficiency disease 06/21/2022   Encounter for general adult medical examination with abnormal findings 06/21/2022   Vitamin B12 deficiency neuropathy ([place]) 02/15/2022   Hypercalcemia 02/15/2022   Cervical stricture or stenosis 10/20/2021   Cervical spinal stenosis 10/19/2021   Meningioma ([place]) 08/24/2021   PAF (paroxysmal atrial fibrillation) ([place]) 11/03/2020   Stage 3b chronic kidney disease ([place]) 05/06/2020   Spinal stenosis in  cervical region 05/12/2016   Therapeutic opioid-induced constipation ([facility]) 01/01/2016   Chronic idiopathic constipation 09/11/2013   Lumbosacral spondylosis without myelopathy 04/09/2013   Postlaminectomy syndrome, lumbar region 04/09/2013   Anticoagulation management encounter 02/01/2012   Neuropathy, peripheral 08/04/2011   Pure hypercholesterolemia 08/04/2011   Essential hypertension, benign 08/04/2011   Hypothyroidism 08/04/2011   DJD (degenerative joint disease) of knee 08/04/2011   Gout 08/04/2011      THERAPY DIAG:  Cervicalgia  Other abnormalities of gait and mobility  Muscle weakness (generalized)  Abnormal posture  Chronic bilateral low back pain without sciatica  PCP: Janith Lima. MD   REFERRING PROVIDER: [name], MD   REFERRING DIAG: Z98.1 (ICD-10-CM) - S/P cervical spinal fusion    Rationale for Evaluation and Treatment Rehabilitation   ONSET DATE: Dec 2022   SUBJECTIVE:  SUBJECTIVE STATEMENT: She says she is walking some in the house with the [facility] instead of RW. She denies pain today    PERTINENT HISTORY:  History of 10/19/21 C3-4 C4-5 C5-6 ACDF with soft collar PMH bracial neuritis, abnormal gait, Degeneration of lumbar, HTN, gouty arthropathy, OA, lumbar laminectomy 2011, Rt TKA   PAIN:  No pain upon arrival.    PRECAUTIONS: None   WEIGHT BEARING RESTRICTIONS No   FALLS:  Has patient fallen in last 6 months? Maybe 1 fall in last 6 months.  Several falls within last year.    LIVING ENVIRONMENT: Lives with:lives alone Lives in: House/apartment Stairs: 3 steps to enter deck, rail on Lt going up.  No stairs in house.  Has following equipment at home: FWW, cane   OCCUPATION: retired    PLOF: Independent  , cooking/cleaning,  shopping/grocery store   [place] stronger, walking independently.     OBJECTIVE:    PATIENT SURVEYS:  01/25/2022 No foto - incorrect set up    COGNITION: 01/25/2022 Overall cognitive status: Within functional limits for tasks assessed     SENSATION: 01/25/2022 not tested today   POSTURE:  01/25/2022 rounded shoulders, forward head, increased thoracic kyphosis, and flexed trunk    PALPATION: 01/25/2022 no specific tenderness noted in screening for balance.       MMT: 01/25/2022:  Seated resisted static testing hip abduction Rt: strong/painless. Lt moderate strength, painless (held MMT due to difficulty in bed mobility positioning MMT Right 01/25/2022 Left 01/25/2022 Left 02/22/2022 Left 03/11/2022 Left 03/16/22 Left 04/01/2022 Left 04/05/22 Left/Right 05/24/22 Left/Right 07/21/22  Shoulder flexion             Shoulder extension             Shoulder abduction             Shoulder adduction             Shoulder extension             Shoulder internal rotation             Shoulder external rotation                                         Hip flexion 5/5 4/5 4/5 4/5 4/5 4+/5 4+ 4+/4+ 4+/4+  Hip extension             Hip abduction             Knee flexion 5/5 5/5         Knee extension  5/5 5/5    4+/5 4+ 4+/4+ 4+/4+  Ankle DF 5/5 4/5  4/5 4+/5    5/5   (Blank rows = not tested)   CERVICAL SPECIAL TESTS:  01/25/2022 none performed   FUNCTIONAL TESTS:   07/21/22 TUG with FWW:  06/03/2022:  TUG c FWW:  34.95 seconds, 32.84 seconds  05/24/22: TUG c FWW:  36 seconds  05/06/2022: TUG c FWW:  37 seconds at end of session  04/05/22 TUG 38.5s seconds with FWW 03/22/22: TUG 44 seconds C FWW (done at very end of session) 03/08/2022:   TUG:  41.76 seconds c FWW 01/25/2022        TUG:   71 seconds c FWW        06/24/22 0001  Berg Balance Test  Sit to Stand 3  Standing  Unsupported 3  Sitting with Back Unsupported but Feet Supported on Floor or Stool 4  Stand to Sit 3   Transfers 3  Standing Unsupported with Eyes Closed 3  Standing Unsupported with Feet Together 3  From Standing, Reach Forward with Outstretched Arm 3  From Standing Position, Pick up Object from Floor 3  From Standing Position, Turn to Look Behind Over each Shoulder 2  Turn 360 Degrees 1  Standing Unsupported, Alternately Place Feet on Step/Stool 0  Standing Unsupported, One Foot in Front 2  Standing on One Leg 0  Total Score 33  (previously 21 on 01/25/2022       [facility] PT Assessment - 01/25/22 0001                Standardized Balance Assessment    Standardized Balance Assessment Berg Balance Test          Berg Balance Test    Sit to Stand Able to stand using hands after several tries     Standing Unsupported Able to stand 2 minutes with supervision     Sitting with Back Unsupported but Feet Supported on Floor or Stool Able to sit safely and securely 2 minutes     Stand to Sit Uses backs of legs against chair to control descent     Transfers Able to transfer safely, definite need of hands     Standing Unsupported with Eyes Closed Able to stand 3 seconds     Standing Unsupported with Feet Together Needs help to attain position but able to stand for 30 seconds with feet together     From Standing, Reach Forward with Outstretched Arm Can reach forward >5 cm safely (2")     From Standing Position, Pick up Object from Floor Unable to pick up and needs supervision     From Standing Position, Turn to Look Behind Over each Shoulder Needs assist to keep from losing balance and falling     Turn 360 Degrees Needs assistance while turning     Standing Unsupported, Alternately Place Feet on Step/Stool Needs assistance to keep from falling or unable to try     Standing Unsupported, One Foot in Front Needs help to step but can hold 15 seconds     Standing on One Leg Unable to try or needs assist to prevent fall     Total Score 21                GAIT ASSESSMENT: 07/09/2022:  Ambulation mod  independent c FWW with marked reduced gait speed, limited in distance due to fatigue (household distances).  Able to perform ambulation c SPC c CGA to occasional SBA within clinic distances < 100 ft.   03/18/2022:  FWW into clinic c mod independent, decreased gait speed.  SPC use in Rt UE c CGA 33 ft today c cues for sequencing.    TODAY'S TREATMENT:  07/28/2022: Therex: Recumbent bike lvl 3-1 10 mins  Leg press double leg 87 lbs 2x15,  single leg 37 lbs 2 x 15 bilateral   TherActivity:   -Treadmill 0.5 MPH X 5 min with UE support bilat and supervision -SPC ambulation obstacle course weaving through 4 cones X 2, up/down ramp  x 2 c CGA gait belt, up/down in front over 6 inch step (stepping up with stronger leg and down with weaker leg using one UE support and cane in other hand. She needed 3 seated rest breaks to complete (75 feet in total and overall  CGA to min A at times with ramp  07/26/2022: Therex: Recumbent bike lvl 2 10 mins  Leg press double leg 81 lbs x 20, single leg 37 lbs 2 x 15 bilateral Seated LAQ 3 lbs x 20 bilateral   TherActivity:   SPC ramp up/down x 2 c CGA gait belt c sequencing cues and up to min A at times.  Ramp up/down c 6 inch step training c FWW and CGA x 2 c cues for sequencing of legs in movements.  Additional time required due to slower movement pattern speeds and to ensure safety.  SPC ambulation within clinic on level surfaces c CGA 40 ft. 50 ft   PATIENT EDUCATION:  05/06/2022 Education details: HEP update Person educated: Patient Education method: Consulting civil engineer, Demonstration, Verbal cues, and Handouts Education comprehension: verbalized understanding, returned demonstration, and verbal cues required     HOME EXERCISE PROGRAM: Access Code: [email or web address] URL: [email or web address] Date: 05/06/2022 Prepared by: Scot Jun  Exercises - Seated March  - 1-2 x daily - 7 x weekly - 1-2 sets - 10 reps - Sit to Stand  - 2-3 x daily - 7 x  weekly - 1 sets - 5-10 reps - Seated Long Arc Quad  - 2-3 x daily - 7 x weekly - 1-2 sets - 10 reps - 2 hold - Standing Scapular Retraction  - 5 x daily - 7 x weekly - 1 sets - 5 reps - 5 second hold - Heel Toe Raises with Counter Support  - 1-2 x daily - 7 x weekly - 1-2 sets - 10 reps - Standing Tandem Balance with Counter Support  - 1-2 x daily - 7 x weekly - 1 sets - 10 reps - Seated Straight Leg Heel Taps  - 1-2 x daily - 7 x weekly - 1-2 sets - 5-10 reps - Seated Quad Set (Mirrored)  - 1-2 x daily - 7 x weekly - 1 sets - 10 reps - 5 hold - Small Range Straight Leg Raise  - 1-2 x daily - 7 x weekly - 1-2 sets - 10-15 reps   ASSESSMENT:   CLINICAL IMPRESSION:  Session focused on overall endurance, leg strength, and ambulation with SPC adding in obstacle course challenges of weaving through cones, up/down ramp, and navigating 6 inch step. She does still have gait instability with these challenges and needs min A at time but has progressed to mostly CGA. She requires rest breaks due to fatigue. Medical necessity for continued skilled PT services indicated to continue towards goals of improved independence and safety.   OBJECTIVE IMPAIRMENTS Abnormal gait, decreased activity tolerance, decreased balance, decreased endurance, decreased mobility, difficulty walking, decreased strength, impaired perceived functional ability, impaired flexibility, improper body mechanics, postural dysfunction, and pain.    ACTIVITY LIMITATIONS carrying, lifting, bending, standing, squatting, stairs, transfers, bed mobility, reach over head, and locomotion level   PARTICIPATION LIMITATIONS: meal prep, cleaning, interpersonal relationship, driving, shopping, and community activity   PERSONAL FACTORS   History of 10/19/21 C3-4 C4-5 C5-6 ACDF with soft collar PMH bracial neuritis, abnormal gait, Degeneration of lumbar, HTN, gouty arthropathy, OA, lumbar laminectomy 2011, Rt TKA  are also affecting patient's functional  outcome.    REHAB POTENTIAL: Good   CLINICAL DECISION MAKING: Stable/uncomplicated   EVALUATION COMPLEXITY: Low     GOALS: Goals reviewed with patient? Yes   Short term PT Goals (target date for Short term goals are 4 weeks 06/23/22) Patient will demonstrate independent use of home exercise program to maintain  progress from in clinic treatments. Goal status: MET   Long term PT goals (target dates for all long term goals are 10 weeks  08/28/2022 )   1. Patient will demonstrate/report pain at worst less than or equal to 2/10 to facilitate minimal limitation in daily activity secondary to pain symptoms. Goal status: MET 04/05/22   2. Patient will demonstrate independent use of home exercise program to facilitate ability to maintain/progress functional gains from skilled physical therapy services. Goal status: MET 05/24/22   3. Patient will demonstrate bilateral hip MMT 5/5 throughout to facilitate improved transfers, ambulation towards independence.  Goal status:on going 06/24/2022   4.  Patient will demonstrate BERG testing > or = 45 to indicate reduced fall risk.  Goal status: on going 06/24/2022   5.  Patient will demonstrate TUG c LRAD < or = 20 seconds to indicate reduced fall risk/ improved community ambulation.    Goal status: on going 07/21/22 37 sec    6.  Patient will demonstrate ability to ambulation c LRAD (SPC, independent) community distances > 300 ft.   Goal status: on going, can do this with RW but not yet with [facility] 07/21/22      PLAN: PT FREQUENCY: 1-2x/week   PT DURATION: 6 more  weeks from recert   PLANNED INTERVENTIONS: Therapeutic exercises, Therapeutic activity, Neuro Muscular re-education, Balance training, Gait training, Patient/Family education, Joint mobilization, Stair training, DME instructions, Dry Needling, Electrical stimulation, Cryotherapy, Moist heat, Taping, Ultrasound, Ionotophoresis '4mg'$ /ml Dexamethasone, and Manual therapy.  All included unless  contraindicated   PLAN FOR NEXT SESSION:  Treadmill alternate visits with bike, [facility] training, strengthening, stair navigation.   Elsie Ra, PT, DPT 07/28/22 1:54 PM

## 2022-08-02 ENCOUNTER — Encounter: Payer: Self-pay | Admitting: Physical Therapy

## 2022-08-02 ENCOUNTER — Ambulatory Visit (INDEPENDENT_AMBULATORY_CARE_PROVIDER_SITE_OTHER): Payer: PPO | Admitting: Physical Therapy

## 2022-08-02 DIAGNOSIS — G8929 Other chronic pain: Secondary | ICD-10-CM

## 2022-08-02 DIAGNOSIS — M542 Cervicalgia: Secondary | ICD-10-CM | POA: Diagnosis not present

## 2022-08-02 DIAGNOSIS — M6281 Muscle weakness (generalized): Secondary | ICD-10-CM | POA: Diagnosis not present

## 2022-08-02 DIAGNOSIS — R293 Abnormal posture: Secondary | ICD-10-CM

## 2022-08-02 DIAGNOSIS — R2689 Other abnormalities of gait and mobility: Secondary | ICD-10-CM | POA: Diagnosis not present

## 2022-08-02 DIAGNOSIS — M545 Low back pain, unspecified: Secondary | ICD-10-CM | POA: Diagnosis not present

## 2022-08-02 NOTE — Therapy (Signed)
OUTPATIENT PHYSICAL THERAPY TREATMENT NOTE    Patient Name: Claudia [name] MRN: [id number] DOB:1934-03-29, 89 y.o., female Today's Date: 08/02/2022   END OF SESSION:   PT End of Session - 08/02/22 1309     Visit Number 36    Number of Visits 45    Date for PT Re-Evaluation 09/15/22    Authorization Type [employer] $15 copay    Progress Note Due on Visit 51   did last one at #33   PT Start Time 1301    PT Stop Time 1344    PT Time Calculation (min) 43 min    Equipment Utilized During Treatment Gait belt    Activity Tolerance Patient tolerated treatment well    Behavior During Therapy WFL for tasks assessed/performed               Past Medical History:  Diagnosis Date   Abnormality of gait    Brachial neuritis or radiculitis NOS    Degeneration of lumbar or lumbosacral intervertebral disc    Dysrhythmia    Essential hypertension, benign    Gouty arthropathy    Lumbago    Obesity    Osteoarthritis    Pure hypercholesterolemia    Unspecified hypothyroidism    Past Surgical History:  Procedure Laterality Date   ABDOMINAL HYSTERECTOMY  03/13/2010   ANTERIOR CERVICAL DECOMP/DISCECTOMY FUSION N/A 10/19/2021   Procedure: C3-4, C4-5, C5-6 ANTERIOR CERVICAL DISCECTOMY FUSION, ALLOGRAFT, PLATE;  Surgeon: [name], MD;  Location: [facility];  Service: Orthopedics;  Laterality: N/A;   BACK SURGERY     LUMBAR LAMINECTOMY  03/13/2010   right knee replacement  03/13/2010   Patient Active Problem List   Diagnosis Date Noted   Vitamin D deficiency disease 06/21/2022   Encounter for general adult medical examination with abnormal findings 06/21/2022   Vitamin B12 deficiency neuropathy ([place]) 02/15/2022   Hypercalcemia 02/15/2022   Cervical stricture or stenosis 10/20/2021   Cervical spinal stenosis 10/19/2021   Meningioma ([place]) 08/24/2021   PAF (paroxysmal atrial fibrillation) ([place]) 11/03/2020   Stage 3b chronic kidney disease ([place]) 05/06/2020   Spinal stenosis in cervical  region 05/12/2016   Therapeutic opioid-induced constipation ([facility]) 01/01/2016   Chronic idiopathic constipation 09/11/2013   Lumbosacral spondylosis without myelopathy 04/09/2013   Postlaminectomy syndrome, lumbar region 04/09/2013   Anticoagulation management encounter 02/01/2012   Neuropathy, peripheral 08/04/2011   Pure hypercholesterolemia 08/04/2011   Essential hypertension, benign 08/04/2011   Hypothyroidism 08/04/2011   DJD (degenerative joint disease) of knee 08/04/2011   Gout 08/04/2011      THERAPY DIAG:  Cervicalgia  Other abnormalities of gait and mobility  Muscle weakness (generalized)  Abnormal posture  Chronic bilateral low back pain without sciatica  PCP: Janith Lima. MD   REFERRING PROVIDER: [name], MD   REFERRING DIAG: Z98.1 (ICD-10-CM) - S/P cervical spinal fusion    Rationale for Evaluation and Treatment Rehabilitation   ONSET DATE: Dec 2022   SUBJECTIVE:  SUBJECTIVE STATEMENT: She denies pain, wants to continue to get stronger and try to progress to [facility] vs RW for ambulation   PERTINENT HISTORY:  History of 10/19/21 C3-4 C4-5 C5-6 ACDF with soft collar PMH bracial neuritis, abnormal gait, Degeneration of lumbar, HTN, gouty arthropathy, OA, lumbar laminectomy 2011, Rt TKA   PAIN:  No pain upon arrival.    PRECAUTIONS: None   WEIGHT BEARING RESTRICTIONS No   FALLS:  Has patient fallen in last 6 months? Maybe 1 fall in last 6 months.  Several falls within last year.    LIVING ENVIRONMENT: Lives with:lives alone Lives in: House/apartment Stairs: 3 steps to enter deck, rail on Lt going up.  No stairs in house.  Has following equipment at home: FWW, cane   OCCUPATION: retired    PLOF: Independent  , cooking/cleaning, shopping/grocery  store   [place] stronger, walking independently.     OBJECTIVE:    PATIENT SURVEYS:  01/25/2022 No foto - incorrect set up    COGNITION: 01/25/2022 Overall cognitive status: Within functional limits for tasks assessed     SENSATION: 01/25/2022 not tested today   POSTURE:  01/25/2022 rounded shoulders, forward head, increased thoracic kyphosis, and flexed trunk    PALPATION: 01/25/2022 no specific tenderness noted in screening for balance.       MMT: 01/25/2022:  Seated resisted static testing hip abduction Rt: strong/painless. Lt moderate strength, painless (held MMT due to difficulty in bed mobility positioning MMT Right 01/25/2022 Left 01/25/2022 Left 02/22/2022 Left 03/11/2022 Left 03/16/22 Left 04/01/2022 Left 04/05/22 Left/Right 05/24/22 Left/Right 07/21/22  Shoulder flexion             Shoulder extension             Shoulder abduction             Shoulder adduction             Shoulder extension             Shoulder internal rotation             Shoulder external rotation                                         Hip flexion 5/5 4/5 4/5 4/5 4/5 4+/5 4+ 4+/4+ 4+/4+  Hip extension             Hip abduction             Knee flexion 5/5 5/5         Knee extension  5/5 5/5    4+/5 4+ 4+/4+ 4+/4+  Ankle DF 5/5 4/5  4/5 4+/5    5/5   (Blank rows = not tested)   CERVICAL SPECIAL TESTS:  01/25/2022 none performed   FUNCTIONAL TESTS:   07/21/22 TUG with FWW:  06/03/2022:  TUG c FWW:  34.95 seconds, 32.84 seconds  05/24/22: TUG c FWW:  36 seconds  05/06/2022: TUG c FWW:  37 seconds at end of session  04/05/22 TUG 38.5s seconds with FWW 03/22/22: TUG 44 seconds C FWW (done at very end of session) 03/08/2022:   TUG:  41.76 seconds c FWW 01/25/2022        TUG:   71 seconds c FWW        06/24/22 0001  Berg Balance Test  Sit to Stand 3  Standing Unsupported  3  Sitting with Back Unsupported but Feet Supported on Floor or Stool 4  Stand to Sit 3  Transfers 3   Standing Unsupported with Eyes Closed 3  Standing Unsupported with Feet Together 3  From Standing, Reach Forward with Outstretched Arm 3  From Standing Position, Pick up Object from Floor 3  From Standing Position, Turn to Look Behind Over each Shoulder 2  Turn 360 Degrees 1  Standing Unsupported, Alternately Place Feet on Step/Stool 0  Standing Unsupported, One Foot in Front 2  Standing on One Leg 0  Total Score 33  (previously 21 on 01/25/2022       [facility] PT Assessment - 01/25/22 0001                Standardized Balance Assessment    Standardized Balance Assessment Berg Balance Test          Berg Balance Test    Sit to Stand Able to stand using hands after several tries     Standing Unsupported Able to stand 2 minutes with supervision     Sitting with Back Unsupported but Feet Supported on Floor or Stool Able to sit safely and securely 2 minutes     Stand to Sit Uses backs of legs against chair to control descent     Transfers Able to transfer safely, definite need of hands     Standing Unsupported with Eyes Closed Able to stand 3 seconds     Standing Unsupported with Feet Together Needs help to attain position but able to stand for 30 seconds with feet together     From Standing, Reach Forward with Outstretched Arm Can reach forward >5 cm safely (2")     From Standing Position, Pick up Object from Floor Unable to pick up and needs supervision     From Standing Position, Turn to Look Behind Over each Shoulder Needs assist to keep from losing balance and falling     Turn 360 Degrees Needs assistance while turning     Standing Unsupported, Alternately Place Feet on Step/Stool Needs assistance to keep from falling or unable to try     Standing Unsupported, One Foot in Front Needs help to step but can hold 15 seconds     Standing on One Leg Unable to try or needs assist to prevent fall     Total Score 21                GAIT ASSESSMENT: 07/09/2022:  Ambulation mod independent c  FWW with marked reduced gait speed, limited in distance due to fatigue (household distances).  Able to perform ambulation c SPC c CGA to occasional SBA within clinic distances < 100 ft.   03/18/2022:  FWW into clinic c mod independent, decreased gait speed.  SPC use in Rt UE c CGA 33 ft today c cues for sequencing.    TODAY'S TREATMENT:  08/02/2022: Therex: Recumbent bike lvl 3-1 10 mins  Leg press double leg 87 lbs 2x15,  single leg 37 lbs 2 x 15 bilateral Leg extension machine 5# DL 3X10 Leg curl machine 15# DL  3X10   TherActivity:   -Treadmill (deferred today as had some back pain with it) -SPC ambulation obstacle weaving through 4 cones X 2 -SPC ambulation after sit to stand and walking 15 feet, 180 deg turn then walking back, 180 deg turn and sit down, performed 2 reps of this after seated rest break  07/28/2022: Therex: Recumbent bike lvl 3-1 10 mins  Leg press double leg 87 lbs 2x15,  single leg 37 lbs 2 x 15 bilateral   TherActivity:   -Treadmill 0.5 MPH X 5 min with UE support bilat and supervision -SPC ambulation obstacle course weaving through 4 cones X 2, up/down ramp  x 2 c CGA gait belt, up/down in front over 6 inch step (stepping up with stronger leg and down with weaker leg using one UE support and cane in other hand. She needed 3 seated rest breaks to complete (75 feet in total and overall CGA to min A at times with ramp  07/26/2022: Therex: Recumbent bike lvl 2 10 mins  Leg press double leg 81 lbs x 20, single leg 37 lbs 2 x 15 bilateral Seated LAQ 3 lbs x 20 bilateral   TherActivity:   SPC ramp up/down x 2 c CGA gait belt c sequencing cues and up to min A at times.  Ramp up/down c 6 inch step training c FWW and CGA x 2 c cues for sequencing of legs in movements.  Additional time required due to slower movement pattern speeds and to ensure safety.  SPC ambulation within clinic on level surfaces c CGA 40 ft. 50 ft   PATIENT EDUCATION:  05/06/2022 Education  details: HEP update Person educated: Patient Education method: Consulting civil engineer, Demonstration, Verbal cues, and Handouts Education comprehension: verbalized understanding, returned demonstration, and verbal cues required     HOME EXERCISE PROGRAM: Access Code: [email or web address] URL: [email or web address] Date: 05/06/2022 Prepared by: Scot Jun  Exercises - Seated March  - 1-2 x daily - 7 x weekly - 1-2 sets - 10 reps - Sit to Stand  - 2-3 x daily - 7 x weekly - 1 sets - 5-10 reps - Seated Long Arc Quad  - 2-3 x daily - 7 x weekly - 1-2 sets - 10 reps - 2 hold - Standing Scapular Retraction  - 5 x daily - 7 x weekly - 1 sets - 5 reps - 5 second hold - Heel Toe Raises with Counter Support  - 1-2 x daily - 7 x weekly - 1-2 sets - 10 reps - Standing Tandem Balance with Counter Support  - 1-2 x daily - 7 x weekly - 1 sets - 10 reps - Seated Straight Leg Heel Taps  - 1-2 x daily - 7 x weekly - 1-2 sets - 5-10 reps - Seated Quad Set (Mirrored)  - 1-2 x daily - 7 x weekly - 1 sets - 10 reps - 5 hold - Small Range Straight Leg Raise  - 1-2 x daily - 7 x weekly - 1-2 sets - 10-15 reps   ASSESSMENT:   CLINICAL IMPRESSION:  Continued to work on leg strength, functional endurance and ambulation with SPC. She requires overall CGA with with this and min A for more dynamic balance challenges. PT recommending to continue with skilled PT which remains medically necessary to improve her functional status for independent ADL's.  OBJECTIVE IMPAIRMENTS Abnormal gait, decreased activity tolerance, decreased balance, decreased endurance, decreased mobility, difficulty walking, decreased strength, impaired perceived functional ability, impaired flexibility, improper body mechanics, postural dysfunction, and pain.    ACTIVITY LIMITATIONS carrying, lifting, bending, standing, squatting, stairs, transfers, bed mobility, reach over head, and locomotion level   PARTICIPATION LIMITATIONS: meal prep, cleaning,  interpersonal relationship, driving, shopping, and community activity   PERSONAL FACTORS   History of 10/19/21 C3-4 C4-5 C5-6 ACDF with soft collar PMH bracial neuritis, abnormal gait, Degeneration of lumbar, HTN, gouty arthropathy, OA, lumbar  laminectomy 2011, Rt TKA  are also affecting patient's functional outcome.    REHAB POTENTIAL: Good   CLINICAL DECISION MAKING: Stable/uncomplicated   EVALUATION COMPLEXITY: Low     GOALS: Goals reviewed with patient? Yes   Short term PT Goals (target date for Short term goals are 4 weeks 06/23/22) Patient will demonstrate independent use of home exercise program to maintain progress from in clinic treatments. Goal status: MET   Long term PT goals (target dates for all long term goals are 10 weeks  08/28/2022 )   1. Patient will demonstrate/report pain at worst less than or equal to 2/10 to facilitate minimal limitation in daily activity secondary to pain symptoms. Goal status: MET 04/05/22   2. Patient will demonstrate independent use of home exercise program to facilitate ability to maintain/progress functional gains from skilled physical therapy services. Goal status: MET 05/24/22   3. Patient will demonstrate bilateral hip MMT 5/5 throughout to facilitate improved transfers, ambulation towards independence.  Goal status:on going 06/24/2022   4.  Patient will demonstrate BERG testing > or = 45 to indicate reduced fall risk.  Goal status: on going 06/24/2022   5.  Patient will demonstrate TUG c LRAD < or = 20 seconds to indicate reduced fall risk/ improved community ambulation.    Goal status: on going 07/21/22 37 sec    6.  Patient will demonstrate ability to ambulation c LRAD (SPC, independent) community distances > 300 ft.   Goal status: on going, can do this with RW but not yet with [facility] 07/21/22      PLAN: PT FREQUENCY: 1-2x/week   PT DURATION: 6 more  weeks from recert   PLANNED INTERVENTIONS: Therapeutic exercises, Therapeutic  activity, Neuro Muscular re-education, Balance training, Gait training, Patient/Family education, Joint mobilization, Stair training, DME instructions, Dry Needling, Electrical stimulation, Cryotherapy, Moist heat, Taping, Ultrasound, Ionotophoresis '4mg'$ /ml Dexamethasone, and Manual therapy.  All included unless contraindicated   PLAN FOR NEXT SESSION:  Treadmill or bike, [facility] training, strengthening, stair navigation.   Elsie Ra, PT, DPT 08/02/22 1:10 PM

## 2022-08-04 ENCOUNTER — Ambulatory Visit (INDEPENDENT_AMBULATORY_CARE_PROVIDER_SITE_OTHER): Payer: HMO | Admitting: Physical Therapy

## 2022-08-04 ENCOUNTER — Encounter: Payer: Self-pay | Admitting: Physical Therapy

## 2022-08-04 DIAGNOSIS — R293 Abnormal posture: Secondary | ICD-10-CM

## 2022-08-04 DIAGNOSIS — M6281 Muscle weakness (generalized): Secondary | ICD-10-CM | POA: Diagnosis not present

## 2022-08-04 DIAGNOSIS — M542 Cervicalgia: Secondary | ICD-10-CM

## 2022-08-04 DIAGNOSIS — G8929 Other chronic pain: Secondary | ICD-10-CM

## 2022-08-04 DIAGNOSIS — R2689 Other abnormalities of gait and mobility: Secondary | ICD-10-CM | POA: Diagnosis not present

## 2022-08-04 DIAGNOSIS — M545 Low back pain, unspecified: Secondary | ICD-10-CM | POA: Diagnosis not present

## 2022-08-04 NOTE — Therapy (Signed)
OUTPATIENT PHYSICAL THERAPY TREATMENT NOTE    Patient Name: Claudia [name] MRN: [id number] DOB:12-25-1933, 89 y.o., female Today's Date: 08/04/2022   END OF SESSION:   PT End of Session - 08/04/22 1301     Visit Number 37    Number of Visits 45    Date for PT Re-Evaluation 09/15/22    Authorization Type [employer] $15 copay    Progress Note Due on Visit 34   did last one at #33   PT Start Time 1301    PT Stop Time 1345    PT Time Calculation (min) 44 min    Equipment Utilized During Treatment Gait belt    Activity Tolerance Patient tolerated treatment well    Behavior During Therapy WFL for tasks assessed/performed               Past Medical History:  Diagnosis Date   Abnormality of gait    Brachial neuritis or radiculitis NOS    Degeneration of lumbar or lumbosacral intervertebral disc    Dysrhythmia    Essential hypertension, benign    Gouty arthropathy    Lumbago    Obesity    Osteoarthritis    Pure hypercholesterolemia    Unspecified hypothyroidism    Past Surgical History:  Procedure Laterality Date   ABDOMINAL HYSTERECTOMY  03/13/2010   ANTERIOR CERVICAL DECOMP/DISCECTOMY FUSION N/A 10/19/2021   Procedure: C3-4, C4-5, C5-6 ANTERIOR CERVICAL DISCECTOMY FUSION, ALLOGRAFT, PLATE;  Surgeon: [name], MD;  Location: [facility];  Service: Orthopedics;  Laterality: N/A;   BACK SURGERY     LUMBAR LAMINECTOMY  03/13/2010   right knee replacement  03/13/2010   Patient Active Problem List   Diagnosis Date Noted   Vitamin D deficiency disease 06/21/2022   Encounter for general adult medical examination with abnormal findings 06/21/2022   Vitamin B12 deficiency neuropathy ([place]) 02/15/2022   Hypercalcemia 02/15/2022   Cervical stricture or stenosis 10/20/2021   Cervical spinal stenosis 10/19/2021   Meningioma ([place]) 08/24/2021   PAF (paroxysmal atrial fibrillation) ([place]) 11/03/2020   Stage 3b chronic kidney disease ([place]) 05/06/2020   Spinal stenosis in cervical  region 05/12/2016   Therapeutic opioid-induced constipation (OIC) 01/01/2016   Chronic idiopathic constipation 09/11/2013   Lumbosacral spondylosis without myelopathy 04/09/2013   Postlaminectomy syndrome, lumbar region 04/09/2013   Anticoagulation management encounter 02/01/2012   Neuropathy, peripheral 08/04/2011   Pure hypercholesterolemia 08/04/2011   Essential hypertension, benign 08/04/2011   Hypothyroidism 08/04/2011   DJD (degenerative joint disease) of knee 08/04/2011   Gout 08/04/2011      THERAPY DIAG:  Cervicalgia  Other abnormalities of gait and mobility  Muscle weakness (generalized)  Abnormal posture  Chronic bilateral low back pain without sciatica  PCP: Janith Lima. MD   REFERRING PROVIDER: [name], MD   REFERRING DIAG: Z98.1 (ICD-10-CM) - S/P cervical spinal fusion    Rationale for Evaluation and Treatment Rehabilitation   ONSET DATE: Dec 2022   SUBJECTIVE:  SUBJECTIVE STATEMENT: She does not report any pain today, but said she thinks the weight on the leg press hurt her back after last session.    PERTINENT HISTORY:  History of 10/19/21 C3-4 C4-5 C5-6 ACDF with soft collar PMH bracial neuritis, abnormal gait, Degeneration of lumbar, HTN, gouty arthropathy, OA, lumbar laminectomy 2011, Rt TKA   PAIN:  No pain upon arrival.    PRECAUTIONS: None   WEIGHT BEARING RESTRICTIONS No   FALLS:  Has patient fallen in last 6 months? Maybe 1 fall in last 6 months.  Several falls within last year.    LIVING ENVIRONMENT: Lives with:lives alone Lives in: House/apartment Stairs: 3 steps to enter deck, rail on Lt going up.  No stairs in house.  Has following equipment at home: FWW, cane   OCCUPATION: retired    PLOF: Independent  ,  cooking/cleaning, shopping/grocery store   [place] stronger, walking independently.     OBJECTIVE:    PATIENT SURVEYS:  01/25/2022 No foto - incorrect set up    COGNITION: 01/25/2022 Overall cognitive status: Within functional limits for tasks assessed     SENSATION: 01/25/2022 not tested today   POSTURE:  01/25/2022 rounded shoulders, forward head, increased thoracic kyphosis, and flexed trunk    PALPATION: 01/25/2022 no specific tenderness noted in screening for balance.       MMT: 01/25/2022:  Seated resisted static testing hip abduction Rt: strong/painless. Lt moderate strength, painless (held MMT due to difficulty in bed mobility positioning MMT Right 01/25/2022 Left 01/25/2022 Left 02/22/2022 Left 03/11/2022 Left 03/16/22 Left 04/01/2022 Left 04/05/22 Left/Right 05/24/22 Left/Right 07/21/22  Shoulder flexion             Shoulder extension             Shoulder abduction             Shoulder adduction             Shoulder extension             Shoulder internal rotation             Shoulder external rotation                                         Hip flexion 5/5 4/5 4/5 4/5 4/5 4+/5 4+ 4+/4+ 4+/4+  Hip extension             Hip abduction             Knee flexion 5/5 5/5         Knee extension  5/5 5/5    4+/5 4+ 4+/4+ 4+/4+  Ankle DF 5/5 4/5  4/5 4+/5    5/5   (Blank rows = not tested)   CERVICAL SPECIAL TESTS:  01/25/2022 none performed   FUNCTIONAL TESTS:   07/21/22 TUG with FWW:  06/03/2022:  TUG c FWW:  34.95 seconds, 32.84 seconds  05/24/22: TUG c FWW:  36 seconds  05/06/2022: TUG c FWW:  37 seconds at end of session  04/05/22 TUG 38.5s seconds with FWW 03/22/22: TUG 44 seconds C FWW (done at very end of session) 03/08/2022:   TUG:  41.76 seconds c FWW 01/25/2022        TUG:   71 seconds c FWW        06/24/22 0001  Berg Balance Test  Sit to  Stand 3  Standing Unsupported 3  Sitting with Back Unsupported but Feet Supported on Floor or Stool  4  Stand to Sit 3  Transfers 3  Standing Unsupported with Eyes Closed 3  Standing Unsupported with Feet Together 3  From Standing, Reach Forward with Outstretched Arm 3  From Standing Position, Pick up Object from Floor 3  From Standing Position, Turn to Look Behind Over each Shoulder 2  Turn 360 Degrees 1  Standing Unsupported, Alternately Place Feet on Step/Stool 0  Standing Unsupported, One Foot in Front 2  Standing on One Leg 0  Total Score 33  (previously 21 on 01/25/2022       [facility] PT Assessment - 01/25/22 0001                Standardized Balance Assessment    Standardized Balance Assessment Berg Balance Test          Berg Balance Test    Sit to Stand Able to stand using hands after several tries     Standing Unsupported Able to stand 2 minutes with supervision     Sitting with Back Unsupported but Feet Supported on Floor or Stool Able to sit safely and securely 2 minutes     Stand to Sit Uses backs of legs against chair to control descent     Transfers Able to transfer safely, definite need of hands     Standing Unsupported with Eyes Closed Able to stand 3 seconds     Standing Unsupported with Feet Together Needs help to attain position but able to stand for 30 seconds with feet together     From Standing, Reach Forward with Outstretched Arm Can reach forward >5 cm safely (2")     From Standing Position, Pick up Object from Floor Unable to pick up and needs supervision     From Standing Position, Turn to Look Behind Over each Shoulder Needs assist to keep from losing balance and falling     Turn 360 Degrees Needs assistance while turning     Standing Unsupported, Alternately Place Feet on Step/Stool Needs assistance to keep from falling or unable to try     Standing Unsupported, One Foot in Front Needs help to step but can hold 15 seconds     Standing on One Leg Unable to try or needs assist to prevent fall     Total Score 21                GAIT  ASSESSMENT: 07/09/2022:  Ambulation mod independent c FWW with marked reduced gait speed, limited in distance due to fatigue (household distances).  Able to perform ambulation c SPC c CGA to occasional SBA within clinic distances < 100 ft.   03/18/2022:  FWW into clinic c mod independent, decreased gait speed.  SPC use in Rt UE c CGA 33 ft today c cues for sequencing.    TODAY'S TREATMENT:  08/04/22 Therex: -Recumbent bike lvl 3-1 10 mins  -Leg press double leg 81# 2x15, single leg 31# 2 x 15 bilateral -Leg extension machine 5# DL 3X10 -Leg curl machine 15# DL  3X10  TherActivity:   -Treadmill 0.6 MPH X 2 min, 0.5 MPH x 1 min with UE support bilat and supervision -SPC ambulation with gait belt throughout clinic around 100 ft total. She needed 1 seated rest break in between to complete (CGA to min A once)  08/02/2022: Therex: Recumbent bike lvl 3-1 10 mins  Leg press double leg 87  lbs 2x15,  single leg 37 lbs 2 x 15 bilateral Leg extension machine 5# DL 3X10 Leg curl machine 15# DL  3X10   TherActivity:   -Treadmill (deferred today as had some back pain with it) -SPC ambulation obstacle weaving through 4 cones X 2 -SPC ambulation after sit to stand and walking 15 feet, 180 deg turn then walking back, 180 deg turn and sit down, performed 2 reps of this after seated rest break  07/28/2022: Therex: Recumbent bike lvl 3-1 10 mins  Leg press double leg 87 lbs 2x15,  single leg 37 lbs 2 x 15 bilateral   TherActivity:   -Treadmill 0.5 MPH X 5 min with UE support bilat and supervision -SPC ambulation obstacle course weaving through 4 cones X 2, up/down ramp  x 2 c CGA gait belt, up/down in front over 6 inch step (stepping up with stronger leg and down with weaker leg using one UE support and cane in other hand. She needed 3 seated rest breaks to complete (75 feet in total and overall CGA to min A at times with ramp   PATIENT EDUCATION:  05/06/2022 Education details: HEP update Person  educated: Patient Education method: Consulting civil engineer, Demonstration, Verbal cues, and Handouts Education comprehension: verbalized understanding, returned demonstration, and verbal cues required     HOME EXERCISE PROGRAM: Access Code: [email or web address] URL: [email or web address] Date: 05/06/2022 Prepared by: Scot Jun  Exercises - Seated March  - 1-2 x daily - 7 x weekly - 1-2 sets - 10 reps - Sit to Stand  - 2-3 x daily - 7 x weekly - 1 sets - 5-10 reps - Seated Long Arc Quad  - 2-3 x daily - 7 x weekly - 1-2 sets - 10 reps - 2 hold - Standing Scapular Retraction  - 5 x daily - 7 x weekly - 1 sets - 5 reps - 5 second hold - Heel Toe Raises with Counter Support  - 1-2 x daily - 7 x weekly - 1-2 sets - 10 reps - Standing Tandem Balance with Counter Support  - 1-2 x daily - 7 x weekly - 1 sets - 10 reps - Seated Straight Leg Heel Taps  - 1-2 x daily - 7 x weekly - 1-2 sets - 5-10 reps - Seated Quad Set (Mirrored)  - 1-2 x daily - 7 x weekly - 1 sets - 10 reps - 5 hold - Small Range Straight Leg Raise  - 1-2 x daily - 7 x weekly - 1-2 sets - 10-15 reps   ASSESSMENT:   CLINICAL IMPRESSION:  She showed good tolerance to treatment session today most notably increasing her treadmill speed slightly. She requires overall CGA with with this and only needed min A once to catch her balance after going sitting to standing during ambulation with SPC in clinic. She will continue to benefit from skilled PT to improve her functional ability and increase independence with ADLs.   OBJECTIVE IMPAIRMENTS Abnormal gait, decreased activity tolerance, decreased balance, decreased endurance, decreased mobility, difficulty walking, decreased strength, impaired perceived functional ability, impaired flexibility, improper body mechanics, postural dysfunction, and pain.    ACTIVITY LIMITATIONS carrying, lifting, bending, standing, squatting, stairs, transfers, bed mobility, reach over head, and locomotion  level   PARTICIPATION LIMITATIONS: meal prep, cleaning, interpersonal relationship, driving, shopping, and community activity   PERSONAL FACTORS   History of 10/19/21 C3-4 C4-5 C5-6 ACDF with soft collar PMH bracial neuritis, abnormal gait, Degeneration of lumbar, HTN, gouty arthropathy, OA, lumbar laminectomy 2011,  Rt TKA  are also affecting patient's functional outcome.    REHAB POTENTIAL: Good   CLINICAL DECISION MAKING: Stable/uncomplicated   EVALUATION COMPLEXITY: Low     GOALS: Goals reviewed with patient? Yes   Short term PT Goals (target date for Short term goals are 4 weeks 06/23/22) Patient will demonstrate independent use of home exercise program to maintain progress from in clinic treatments. Goal status: MET   Long term PT goals (target dates for all long term goals are 10 weeks  08/28/2022 )   1. Patient will demonstrate/report pain at worst less than or equal to 2/10 to facilitate minimal limitation in daily activity secondary to pain symptoms. Goal status: MET 04/05/22   2. Patient will demonstrate independent use of home exercise program to facilitate ability to maintain/progress functional gains from skilled physical therapy services. Goal status: MET 05/24/22   3. Patient will demonstrate bilateral hip MMT 5/5 throughout to facilitate improved transfers, ambulation towards independence.  Goal status:on going 06/24/2022   4.  Patient will demonstrate BERG testing > or = 45 to indicate reduced fall risk.  Goal status: on going 06/24/2022   5.  Patient will demonstrate TUG c LRAD < or = 20 seconds to indicate reduced fall risk/ improved community ambulation.    Goal status: on going 07/21/22 37 sec    6.  Patient will demonstrate ability to ambulation c LRAD (SPC, independent) community distances > 300 ft.   Goal status: on going, can do this with RW but not yet with [facility] 07/21/22      PLAN: PT FREQUENCY: 1-2x/week   PT DURATION: 6 more  weeks from recert    PLANNED INTERVENTIONS: Therapeutic exercises, Therapeutic activity, Neuro Muscular re-education, Balance training, Gait training, Patient/Family education, Joint mobilization, Stair training, DME instructions, Dry Needling, Electrical stimulation, Cryotherapy, Moist heat, Taping, Ultrasound, Ionotophoresis '4mg'$ /ml Dexamethasone, and Manual therapy.  All included unless contraindicated   PLAN FOR NEXT SESSION:  Treadmill or bike, [facility] training, strengthening, stair navigation.   Donnie Gedeon, SPT  08/04/22 1:57 PM

## 2022-08-05 ENCOUNTER — Encounter (HOSPITAL_COMMUNITY): Payer: Self-pay | Admitting: *Deleted

## 2022-08-09 ENCOUNTER — Ambulatory Visit (INDEPENDENT_AMBULATORY_CARE_PROVIDER_SITE_OTHER): Payer: HMO

## 2022-08-09 DIAGNOSIS — E538 Deficiency of other specified B group vitamins: Secondary | ICD-10-CM | POA: Diagnosis not present

## 2022-08-09 MED ORDER — CYANOCOBALAMIN 1000 MCG/ML IJ SOLN
1000.0000 ug | Freq: Once | INTRAMUSCULAR | Status: AC
  Administered 2022-08-09: 1000 ug via INTRAMUSCULAR

## 2022-08-09 NOTE — Progress Notes (Cosign Needed)
After obtaining consent, and per orders of Dr. Ronnald Ramp, injection of B12 given by Marrian Salvage. Patient instructed to report any adverse reaction to me immediately.

## 2022-08-10 ENCOUNTER — Encounter: Payer: Self-pay | Admitting: Physical Therapy

## 2022-08-10 ENCOUNTER — Ambulatory Visit (INDEPENDENT_AMBULATORY_CARE_PROVIDER_SITE_OTHER): Payer: HMO | Admitting: Physical Therapy

## 2022-08-10 DIAGNOSIS — M545 Low back pain, unspecified: Secondary | ICD-10-CM

## 2022-08-10 DIAGNOSIS — M542 Cervicalgia: Secondary | ICD-10-CM | POA: Diagnosis not present

## 2022-08-10 DIAGNOSIS — R293 Abnormal posture: Secondary | ICD-10-CM

## 2022-08-10 DIAGNOSIS — M6281 Muscle weakness (generalized): Secondary | ICD-10-CM | POA: Diagnosis not present

## 2022-08-10 DIAGNOSIS — R2689 Other abnormalities of gait and mobility: Secondary | ICD-10-CM

## 2022-08-10 DIAGNOSIS — G8929 Other chronic pain: Secondary | ICD-10-CM

## 2022-08-10 NOTE — Therapy (Signed)
OUTPATIENT PHYSICAL THERAPY TREATMENT NOTE    Patient Name: TYSHA SIDHU MRN: [id number] DOB:[birth year], 89 y.o., female Today's Date: 08/10/2022   END OF SESSION:   PT End of Session - 08/10/22 1301     Visit Number 38    Number of Visits 45    Date for PT Re-Evaluation 09/15/22    Authorization Type [employer] $15 copay    Progress Note Due on Visit 69   did last one at #33   PT Start Time 1301    PT Stop Time 1345    PT Time Calculation (min) 44 min    Equipment Utilized During Treatment Gait belt    Activity Tolerance Patient tolerated treatment well    Behavior During Therapy WFL for tasks assessed/performed               Past Medical History:  Diagnosis Date   Abnormality of gait    Brachial neuritis or radiculitis NOS    Degeneration of lumbar or lumbosacral intervertebral disc    Dysrhythmia    Essential hypertension, benign    Gouty arthropathy    Lumbago    Obesity    Osteoarthritis    Pure hypercholesterolemia    Unspecified hypothyroidism    Past Surgical History:  Procedure Laterality Date   ABDOMINAL HYSTERECTOMY  03/13/2010   ANTERIOR CERVICAL DECOMP/DISCECTOMY FUSION N/A 10/19/2021   Procedure: C3-4, C4-5, C5-6 ANTERIOR CERVICAL DISCECTOMY FUSION, ALLOGRAFT, PLATE;  Surgeon: [name], MD;  Location: [place];  Service: Orthopedics;  Laterality: N/A;   BACK SURGERY     LUMBAR LAMINECTOMY  03/13/2010   right knee replacement  03/13/2010   Patient Active Problem List   Diagnosis Date Noted   Vitamin D deficiency disease 06/21/2022   Encounter for general adult medical examination with abnormal findings 06/21/2022   Vitamin B12 deficiency neuropathy ([place]) 02/15/2022   Hypercalcemia 02/15/2022   Cervical stricture or stenosis 10/20/2021   Cervical spinal stenosis 10/19/2021   Meningioma ([place]) 08/24/2021   PAF (paroxysmal atrial fibrillation) ([place]) 11/03/2020   Stage 3b chronic kidney disease ([place]) 05/06/2020   Spinal stenosis in  cervical region 05/12/2016   Therapeutic opioid-induced constipation ([facility]) 01/01/2016   Chronic idiopathic constipation 09/11/2013   Lumbosacral spondylosis without myelopathy 04/09/2013   Postlaminectomy syndrome, lumbar region 04/09/2013   Anticoagulation management encounter 02/01/2012   Neuropathy, peripheral 08/04/2011   Pure hypercholesterolemia 08/04/2011   Essential hypertension, benign 08/04/2011   Hypothyroidism 08/04/2011   DJD (degenerative joint disease) of knee 08/04/2011   Gout 08/04/2011      THERAPY DIAG:  Cervicalgia  Other abnormalities of gait and mobility  Muscle weakness (generalized)  Abnormal posture  Chronic bilateral low back pain without sciatica  PCP: Janith Lima. MD   REFERRING PROVIDER: [name], MD   REFERRING DIAG: Z98.1 (ICD-10-CM) - S/P cervical spinal fusion    Rationale for Evaluation and Treatment Rehabilitation   ONSET DATE: Dec 2022   SUBJECTIVE:  SUBJECTIVE STATEMENT: She reports no pain today and no pain after last session like previously.    PERTINENT HISTORY:  History of 10/19/21 C3-4 C4-5 C5-6 ACDF with soft collar PMH bracial neuritis, abnormal gait, Degeneration of lumbar, HTN, gouty arthropathy, OA, lumbar laminectomy 2011, Rt TKA   PAIN:  No pain upon arrival.    PRECAUTIONS: None   WEIGHT BEARING RESTRICTIONS No   FALLS:  Has patient fallen in last 6 months? Maybe 1 fall in last 6 months.  Several falls within last year.    LIVING ENVIRONMENT: Lives with:lives alone Lives in: House/apartment Stairs: 3 steps to enter deck, rail on Lt going up.  No stairs in house.  Has following equipment at home: FWW, cane   OCCUPATION: retired    PLOF: Independent  , cooking/cleaning, shopping/grocery store    [place] stronger, walking independently.     OBJECTIVE:    PATIENT SURVEYS:  01/25/2022 No foto - incorrect set up    COGNITION: 01/25/2022 Overall cognitive status: Within functional limits for tasks assessed     SENSATION: 01/25/2022 not tested today   POSTURE:  01/25/2022 rounded shoulders, forward head, increased thoracic kyphosis, and flexed trunk    PALPATION: 01/25/2022 no specific tenderness noted in screening for balance.       MMT: 01/25/2022:  Seated resisted static testing hip abduction Rt: strong/painless. Lt moderate strength, painless (held MMT due to difficulty in bed mobility positioning MMT Right 01/25/2022 Left 01/25/2022 Left 02/22/2022 Left 03/11/2022 Left 03/16/22 Left 04/01/2022 Left 04/05/22 Left/Right 05/24/22 Left/Right 07/21/22  Shoulder flexion             Shoulder extension             Shoulder abduction             Shoulder adduction             Shoulder extension             Shoulder internal rotation             Shoulder external rotation                                         Hip flexion 5/5 4/5 4/5 4/5 4/5 4+/5 4+ 4+/4+ 4+/4+  Hip extension             Hip abduction             Knee flexion 5/5 5/5         Knee extension  5/5 5/5    4+/5 4+ 4+/4+ 4+/4+  Ankle DF 5/5 4/5  4/5 4+/5    5/5   (Blank rows = not tested)   CERVICAL SPECIAL TESTS:  01/25/2022 none performed   FUNCTIONAL TESTS:   07/21/22 TUG with FWW:  06/03/2022:  TUG c FWW:  34.95 seconds, 32.84 seconds  05/24/22: TUG c FWW:  36 seconds  05/06/2022: TUG c FWW:  37 seconds at end of session  04/05/22 TUG 38.5s seconds with FWW 03/22/22: TUG 44 seconds C FWW (done at very end of session) 03/08/2022:   TUG:  41.76 seconds c FWW 01/25/2022        TUG:   71 seconds c FWW        06/24/22 0001  Berg Balance Test  Sit to Stand 3  Standing Unsupported 3  Sitting with Back  Unsupported but Feet Supported on Floor or Stool 4  Stand to Sit 3  Transfers 3  Standing  Unsupported with Eyes Closed 3  Standing Unsupported with Feet Together 3  From Standing, Reach Forward with Outstretched Arm 3  From Standing Position, Pick up Object from Floor 3  From Standing Position, Turn to Look Behind Over each Shoulder 2  Turn 360 Degrees 1  Standing Unsupported, Alternately Place Feet on Step/Stool 0  Standing Unsupported, One Foot in Front 2  Standing on One Leg 0  Total Score 33  (previously 21 on 01/25/2022       [facility] PT Assessment - 01/25/22 0001                Standardized Balance Assessment    Standardized Balance Assessment Berg Balance Test          Berg Balance Test    Sit to Stand Able to stand using hands after several tries     Standing Unsupported Able to stand 2 minutes with supervision     Sitting with Back Unsupported but Feet Supported on Floor or Stool Able to sit safely and securely 2 minutes     Stand to Sit Uses backs of legs against chair to control descent     Transfers Able to transfer safely, definite need of hands     Standing Unsupported with Eyes Closed Able to stand 3 seconds     Standing Unsupported with Feet Together Needs help to attain position but able to stand for 30 seconds with feet together     From Standing, Reach Forward with Outstretched Arm Can reach forward >5 cm safely (2")     From Standing Position, Pick up Object from Floor Unable to pick up and needs supervision     From Standing Position, Turn to Look Behind Over each Shoulder Needs assist to keep from losing balance and falling     Turn 360 Degrees Needs assistance while turning     Standing Unsupported, Alternately Place Feet on Step/Stool Needs assistance to keep from falling or unable to try     Standing Unsupported, One Foot in Front Needs help to step but can hold 15 seconds     Standing on One Leg Unable to try or needs assist to prevent fall     Total Score 21                GAIT ASSESSMENT: 07/09/2022:  Ambulation mod independent c FWW with  marked reduced gait speed, limited in distance due to fatigue (household distances).  Able to perform ambulation c SPC c CGA to occasional SBA within clinic distances < 100 ft.   03/18/2022:  FWW into clinic c mod independent, decreased gait speed.  SPC use in Rt UE c CGA 33 ft today c cues for sequencing.    TODAY'S TREATMENT:  08/09/22 Therex: -Recumbent bike lvl 3-1 x10 min, seat 8  -Leg press double leg 81# 2x15, single leg 31# 2x15 bilateral -Leg extension machine 5# DL 3X10 -Leg curl machine 15# DL 3X10  TherActivity:   -SPC ambulation with gait belt throughout clinic around 100 ft total. She needed 1 seated rest break in between to complete (CGA) -Navigating up and down ramp with SPC (using UE support as well going up, min A overall)  08/04/22 Therex: -Recumbent bike lvl 3-1 10 mins  -Leg press double leg 81# 2x15, single leg 31# 2 x 15 bilateral -Leg extension machine 5# DL 3X10 -Leg  curl machine 15# DL  3X10  TherActivity:   -Treadmill 0.6 MPH X 2 min, 0.5 MPH x 1 min with UE support bilat and supervision -SPC ambulation with gait belt throughout clinic around 100 ft total. She needed 1 seated rest break in between to complete (CGA to min A once)  08/02/2022: Therex: Recumbent bike lvl 3-1 10 mins  Leg press double leg 87 lbs 2x15,  single leg 37 lbs 2 x 15 bilateral Leg extension machine 5# DL 3X10 Leg curl machine 15# DL  3X10   TherActivity:   -Treadmill (deferred today as had some back pain with it) -SPC ambulation obstacle weaving through 4 cones X 2 -SPC ambulation after sit to stand and walking 15 feet, 180 deg turn then walking back, 180 deg turn and sit down, performed 2 reps of this after seated rest break    PATIENT EDUCATION:  05/06/2022 Education details: HEP update Person educated: Patient Education method: Consulting civil engineer, Demonstration, Verbal cues, and Handouts Education comprehension: verbalized understanding, returned demonstration, and verbal cues  required     HOME EXERCISE PROGRAM: Access Code: [email or web address] URL: [email or web address] Date: 05/06/2022 Prepared by: Scot Jun  Exercises - Seated March  - 1-2 x daily - 7 x weekly - 1-2 sets - 10 reps - Sit to Stand  - 2-3 x daily - 7 x weekly - 1 sets - 5-10 reps - Seated Long Arc Quad  - 2-3 x daily - 7 x weekly - 1-2 sets - 10 reps - 2 hold - Standing Scapular Retraction  - 5 x daily - 7 x weekly - 1 sets - 5 reps - 5 second hold - Heel Toe Raises with Counter Support  - 1-2 x daily - 7 x weekly - 1-2 sets - 10 reps - Standing Tandem Balance with Counter Support  - 1-2 x daily - 7 x weekly - 1 sets - 10 reps - Seated Straight Leg Heel Taps  - 1-2 x daily - 7 x weekly - 1-2 sets - 5-10 reps - Seated Quad Set (Mirrored)  - 1-2 x daily - 7 x weekly - 1 sets - 10 reps - 5 hold - Small Range Straight Leg Raise  - 1-2 x daily - 7 x weekly - 1-2 sets - 10-15 reps   ASSESSMENT:   CLINICAL IMPRESSION:  She tolerated the treatment fair today. She was able to walk further before needing a rest break, but she was unable to complete step ups at end due to feeling weakness in legs. She will continue to benefit from skilled PT to address limitations of endurance, balance, and LE strength.   OBJECTIVE IMPAIRMENTS Abnormal gait, decreased activity tolerance, decreased balance, decreased endurance, decreased mobility, difficulty walking, decreased strength, impaired perceived functional ability, impaired flexibility, improper body mechanics, postural dysfunction, and pain.    ACTIVITY LIMITATIONS carrying, lifting, bending, standing, squatting, stairs, transfers, bed mobility, reach over head, and locomotion level   PARTICIPATION LIMITATIONS: meal prep, cleaning, interpersonal relationship, driving, shopping, and community activity   PERSONAL FACTORS   History of 10/19/21 C3-4 C4-5 C5-6 ACDF with soft collar PMH bracial neuritis, abnormal gait, Degeneration of lumbar, HTN, gouty  arthropathy, OA, lumbar laminectomy 2011, Rt TKA  are also affecting patient's functional outcome.    REHAB POTENTIAL: Good   CLINICAL DECISION MAKING: Stable/uncomplicated   EVALUATION COMPLEXITY: Low     GOALS: Goals reviewed with patient? Yes   Short term PT Goals (target date for Short term goals are 4 weeks 06/23/22)  Patient will demonstrate independent use of home exercise program to maintain progress from in clinic treatments. Goal status: MET   Long term PT goals (target dates for all long term goals are 10 weeks  08/28/2022 )   1. Patient will demonstrate/report pain at worst less than or equal to 2/10 to facilitate minimal limitation in daily activity secondary to pain symptoms. Goal status: MET 04/05/22   2. Patient will demonstrate independent use of home exercise program to facilitate ability to maintain/progress functional gains from skilled physical therapy services. Goal status: MET 05/24/22   3. Patient will demonstrate bilateral hip MMT 5/5 throughout to facilitate improved transfers, ambulation towards independence.  Goal status:on going 06/24/2022   4.  Patient will demonstrate BERG testing > or = 45 to indicate reduced fall risk.  Goal status: on going 06/24/2022   5.  Patient will demonstrate TUG c LRAD < or = 20 seconds to indicate reduced fall risk/ improved community ambulation.    Goal status: on going 07/21/22 37 sec    6.  Patient will demonstrate ability to ambulation c LRAD (SPC, independent) community distances > 300 ft.   Goal status: on going, can do this with RW but not yet with [facility] 07/21/22      PLAN: PT FREQUENCY: 1-2x/week   PT DURATION: 6 more  weeks from recert   PLANNED INTERVENTIONS: Therapeutic exercises, Therapeutic activity, Neuro Muscular re-education, Balance training, Gait training, Patient/Family education, Joint mobilization, Stair training, DME instructions, Dry Needling, Electrical stimulation, Cryotherapy, Moist heat, Taping,  Ultrasound, Ionotophoresis 58m/ml Dexamethasone, and Manual therapy.  All included unless contraindicated   PLAN FOR NEXT SESSION:  Treadmill or bike, [facility], strengthening, stair navigation or step up (towards beginning)   Elliett Guarisco, SPT  08/10/22 1:52 PM

## 2022-08-12 ENCOUNTER — Encounter: Payer: Self-pay | Admitting: Physical Therapy

## 2022-08-12 ENCOUNTER — Ambulatory Visit (INDEPENDENT_AMBULATORY_CARE_PROVIDER_SITE_OTHER): Payer: HMO | Admitting: Physical Therapy

## 2022-08-12 DIAGNOSIS — G8929 Other chronic pain: Secondary | ICD-10-CM

## 2022-08-12 DIAGNOSIS — M542 Cervicalgia: Secondary | ICD-10-CM | POA: Diagnosis not present

## 2022-08-12 DIAGNOSIS — M545 Low back pain, unspecified: Secondary | ICD-10-CM

## 2022-08-12 DIAGNOSIS — M6281 Muscle weakness (generalized): Secondary | ICD-10-CM | POA: Diagnosis not present

## 2022-08-12 DIAGNOSIS — R2689 Other abnormalities of gait and mobility: Secondary | ICD-10-CM | POA: Diagnosis not present

## 2022-08-12 DIAGNOSIS — R293 Abnormal posture: Secondary | ICD-10-CM | POA: Diagnosis not present

## 2022-08-12 NOTE — Therapy (Signed)
OUTPATIENT PHYSICAL THERAPY TREATMENT NOTE    Patient Name: Claudia [name] MRN: [id number] DOB:04-19-1934, 89 y.o., female Today's Date: 08/12/2022   END OF SESSION:   PT End of Session - 08/12/22 1436     Visit Number 39    Number of Visits 45    Date for PT Re-Evaluation 09/15/22    Authorization Type [employer] $15 copay    Progress Note Due on Visit 30   did last one at #33   PT Start Time 1341    PT Stop Time 1425    PT Time Calculation (min) 44 min    Equipment Utilized During Treatment Gait belt    Activity Tolerance Patient tolerated treatment well    Behavior During Therapy WFL for tasks assessed/performed                Past Medical History:  Diagnosis Date   Abnormality of gait    Brachial neuritis or radiculitis NOS    Degeneration of lumbar or lumbosacral intervertebral disc    Dysrhythmia    Essential hypertension, benign    Gouty arthropathy    Lumbago    Obesity    Osteoarthritis    Pure hypercholesterolemia    Unspecified hypothyroidism    Past Surgical History:  Procedure Laterality Date   ABDOMINAL HYSTERECTOMY  03/13/2010   ANTERIOR CERVICAL DECOMP/DISCECTOMY FUSION N/A 10/19/2021   Procedure: C3-4, C4-5, C5-6 ANTERIOR CERVICAL DISCECTOMY FUSION, ALLOGRAFT, PLATE;  Surgeon: [name], MD;  Location: [facility];  Service: Orthopedics;  Laterality: N/A;   BACK SURGERY     LUMBAR LAMINECTOMY  03/13/2010   right knee replacement  03/13/2010   Patient Active Problem List   Diagnosis Date Noted   Vitamin D deficiency disease 06/21/2022   Encounter for general adult medical examination with abnormal findings 06/21/2022   Vitamin B12 deficiency neuropathy ([place]) 02/15/2022   Hypercalcemia 02/15/2022   Cervical stricture or stenosis 10/20/2021   Cervical spinal stenosis 10/19/2021   Meningioma ([place]) 08/24/2021   PAF (paroxysmal atrial fibrillation) ([place]) 11/03/2020   Stage 3b chronic kidney disease ([place]) 05/06/2020   Spinal stenosis in  cervical region 05/12/2016   Therapeutic opioid-induced constipation ([facility]) 01/01/2016   Chronic idiopathic constipation 09/11/2013   Lumbosacral spondylosis without myelopathy 04/09/2013   Postlaminectomy syndrome, lumbar region 04/09/2013   Anticoagulation management encounter 02/01/2012   Neuropathy, peripheral 08/04/2011   Pure hypercholesterolemia 08/04/2011   Essential hypertension, benign 08/04/2011   Hypothyroidism 08/04/2011   DJD (degenerative joint disease) of knee 08/04/2011   Gout 08/04/2011      THERAPY DIAG:  Cervicalgia  Other abnormalities of gait and mobility  Muscle weakness (generalized)  Abnormal posture  Chronic bilateral low back pain without sciatica  PCP: Janith Lima. MD   REFERRING PROVIDER: [name], MD   REFERRING DIAG: Z98.1 (ICD-10-CM) - S/P cervical spinal fusion    Rationale for Evaluation and Treatment Rehabilitation   ONSET DATE: Dec 2022   SUBJECTIVE:  SUBJECTIVE STATEMENT: She said she feels good today and reports no pain.    PERTINENT HISTORY:  History of 10/19/21 C3-4 C4-5 C5-6 ACDF with soft collar PMH bracial neuritis, abnormal gait, Degeneration of lumbar, HTN, gouty arthropathy, OA, lumbar laminectomy 2011, Rt TKA   PAIN:  No pain upon arrival.    PRECAUTIONS: None   WEIGHT BEARING RESTRICTIONS No   FALLS:  Has patient fallen in last 6 months? Maybe 1 fall in last 6 months.  Several falls within last year.    LIVING ENVIRONMENT: Lives with:lives alone Lives in: House/apartment Stairs: 3 steps to enter deck, rail on Lt going up.  No stairs in house.  Has following equipment at home: FWW, cane   OCCUPATION: retired    PLOF: Independent  , cooking/cleaning, shopping/grocery store   Berry  stronger, walking independently.     OBJECTIVE:    PATIENT SURVEYS:  01/25/2022 No foto - incorrect set up    COGNITION: 01/25/2022 Overall cognitive status: Within functional limits for tasks assessed     SENSATION: 01/25/2022 not tested today   POSTURE:  01/25/2022 rounded shoulders, forward head, increased thoracic kyphosis, and flexed trunk    PALPATION: 01/25/2022 no specific tenderness noted in screening for balance.       MMT: 01/25/2022:  Seated resisted static testing hip abduction Rt: strong/painless. Lt moderate strength, painless (held MMT due to difficulty in bed mobility positioning MMT Right 01/25/2022 Left 01/25/2022 Left 02/22/2022 Left 03/11/2022 Left 03/16/22 Left 04/01/2022 Left 04/05/22 Left/Right 05/24/22 Left/Right 07/21/22  Shoulder flexion             Shoulder extension             Shoulder abduction             Shoulder adduction             Shoulder extension             Shoulder internal rotation             Shoulder external rotation                                         Hip flexion 5/5 4/5 4/5 4/5 4/5 4+/5 4+ 4+/4+ 4+/4+  Hip extension             Hip abduction             Knee flexion 5/5 5/5         Knee extension  5/5 5/5    4+/5 4+ 4+/4+ 4+/4+  Ankle DF 5/5 4/5  4/5 4+/5    5/5   (Blank rows = not tested)   CERVICAL SPECIAL TESTS:  01/25/2022 none performed   FUNCTIONAL TESTS:   07/21/22 TUG with FWW:  06/03/2022:  TUG c FWW:  34.95 seconds, 32.84 seconds  05/24/22: TUG c FWW:  36 seconds  05/06/2022: TUG c FWW:  37 seconds at end of session  04/05/22 TUG 38.5s seconds with FWW 03/22/22: TUG 44 seconds C FWW (done at very end of session) 03/08/2022:   TUG:  41.76 seconds c FWW 01/25/2022        TUG:   71 seconds c FWW        06/24/22 0001  Berg Balance Test  Sit to Stand 3  Standing Unsupported 3  Sitting with Back Unsupported but Feet  Supported on Floor or Stool 4  Stand to Sit 3  Transfers 3  Standing Unsupported with Eyes  Closed 3  Standing Unsupported with Feet Together 3  From Standing, Reach Forward with Outstretched Arm 3  From Standing Position, Pick up Object from Floor 3  From Standing Position, Turn to Look Behind Over each Shoulder 2  Turn 360 Degrees 1  Standing Unsupported, Alternately Place Feet on Step/Stool 0  Standing Unsupported, One Foot in Front 2  Standing on One Leg 0  Total Score 33  (previously 21 on 01/25/2022       [facility] PT Assessment - 01/25/22 0001                Standardized Balance Assessment    Standardized Balance Assessment Berg Balance Test          Berg Balance Test    Sit to Stand Able to stand using hands after several tries     Standing Unsupported Able to stand 2 minutes with supervision     Sitting with Back Unsupported but Feet Supported on Floor or Stool Able to sit safely and securely 2 minutes     Stand to Sit Uses backs of legs against chair to control descent     Transfers Able to transfer safely, definite need of hands     Standing Unsupported with Eyes Closed Able to stand 3 seconds     Standing Unsupported with Feet Together Needs help to attain position but able to stand for 30 seconds with feet together     From Standing, Reach Forward with Outstretched Arm Can reach forward >5 cm safely (2")     From Standing Position, Pick up Object from Floor Unable to pick up and needs supervision     From Standing Position, Turn to Look Behind Over each Shoulder Needs assist to keep from losing balance and falling     Turn 360 Degrees Needs assistance while turning     Standing Unsupported, Alternately Place Feet on Step/Stool Needs assistance to keep from falling or unable to try     Standing Unsupported, One Foot in Front Needs help to step but can hold 15 seconds     Standing on One Leg Unable to try or needs assist to prevent fall     Total Score 21                GAIT ASSESSMENT: 07/09/2022:  Ambulation mod independent c FWW with marked reduced gait  speed, limited in distance due to fatigue (household distances).  Able to perform ambulation c SPC c CGA to occasional SBA within clinic distances < 100 ft.   03/18/2022:  FWW into clinic c mod independent, decreased gait speed.  SPC use in Rt UE c CGA 33 ft today c cues for sequencing.    TODAY'S TREATMENT:  08/12/22 Therex: -Recumbent bike lvl 3-1 x10 min, seat 8  -Leg press double leg 81# 2x15, single leg 31# 2x15 bilateral -Leg extension machine 5# DL 3X10 -Leg curl machine 20# DL 3X10  TherActivity:   -SPC ambulation with gait belt throughout clinic around 170 ft total (other exercises completed in between) -Navigating up ramp and down two 4 1/4 in steps with SPC (min to mod A using my arm as support for left) -Navigating up and down 4 1/4 in step with SPC (min to mod A using my arm as support for left)   08/09/22 Therex: -Recumbent bike lvl 3-1 x10 min, seat 8  -  Leg press double leg 81# 2x15, single leg 31# 2x15 bilateral -Leg extension machine 5# DL 3X10 -Leg curl machine 15# DL 3X10  TherActivity:   -SPC ambulation with gait belt throughout clinic around 100 ft total. She needed 1 seated rest break in between to complete (CGA) -Navigating up and down ramp with SPC (using UE support as well going up, min A overall)  08/04/22 Therex: -Recumbent bike lvl 3-1 10 mins  -Leg press double leg 81# 2x15, single leg 31# 2 x 15 bilateral -Leg extension machine 5# DL 3X10 -Leg curl machine 15# DL  3X10  TherActivity:   -Treadmill 0.6 MPH X 2 min, 0.5 MPH x 1 min with UE support bilat and supervision -SPC ambulation with gait belt throughout clinic around 100 ft total. She needed 1 seated rest break in between to complete (CGA to min A once)   PATIENT EDUCATION:  05/06/2022 Education details: HEP update Person educated: Patient Education method: Consulting civil engineer, Demonstration, Verbal cues, and Handouts Education comprehension: verbalized understanding, returned demonstration, and verbal  cues required     HOME EXERCISE PROGRAM: Access Code: [email or web address] URL: [email or web address] Date: 05/06/2022 Prepared by: Scot Jun  Exercises - Seated March  - 1-2 x daily - 7 x weekly - 1-2 sets - 10 reps - Sit to Stand  - 2-3 x daily - 7 x weekly - 1 sets - 5-10 reps - Seated Long Arc Quad  - 2-3 x daily - 7 x weekly - 1-2 sets - 10 reps - 2 hold - Standing Scapular Retraction  - 5 x daily - 7 x weekly - 1 sets - 5 reps - 5 second hold - Heel Toe Raises with Counter Support  - 1-2 x daily - 7 x weekly - 1-2 sets - 10 reps - Standing Tandem Balance with Counter Support  - 1-2 x daily - 7 x weekly - 1 sets - 10 reps - Seated Straight Leg Heel Taps  - 1-2 x daily - 7 x weekly - 1-2 sets - 5-10 reps - Seated Quad Set (Mirrored)  - 1-2 x daily - 7 x weekly - 1 sets - 10 reps - 5 hold - Small Range Straight Leg Raise  - 1-2 x daily - 7 x weekly - 1-2 sets - 10-15 reps   ASSESSMENT:   CLINICAL IMPRESSION:  She showed good tolerance to treatment today. She was able to increase weight on the hamstring curl machine and walk further without getting fatigued as easily. However, her endurance and overall LE strength especially on the left still shows limitations and she will benefit from skilled PT to address these deficits.   OBJECTIVE IMPAIRMENTS Abnormal gait, decreased activity tolerance, decreased balance, decreased endurance, decreased mobility, difficulty walking, decreased strength, impaired perceived functional ability, impaired flexibility, improper body mechanics, postural dysfunction, and pain.    ACTIVITY LIMITATIONS carrying, lifting, bending, standing, squatting, stairs, transfers, bed mobility, reach over head, and locomotion level   PARTICIPATION LIMITATIONS: meal prep, cleaning, interpersonal relationship, driving, shopping, and community activity   PERSONAL FACTORS   History of 10/19/21 C3-4 C4-5 C5-6 ACDF with soft collar PMH bracial neuritis, abnormal gait,  Degeneration of lumbar, HTN, gouty arthropathy, OA, lumbar laminectomy 2011, Rt TKA  are also affecting patient's functional outcome.    REHAB POTENTIAL: Good   CLINICAL DECISION MAKING: Stable/uncomplicated   EVALUATION COMPLEXITY: Low     GOALS: Goals reviewed with patient? Yes   Short term PT Goals (target date for Short term goals are 4  weeks 06/23/22) Patient will demonstrate independent use of home exercise program to maintain progress from in clinic treatments. Goal status: MET   Long term PT goals (target dates for all long term goals are 10 weeks  08/28/2022 )   1. Patient will demonstrate/report pain at worst less than or equal to 2/10 to facilitate minimal limitation in daily activity secondary to pain symptoms. Goal status: MET 04/05/22   2. Patient will demonstrate independent use of home exercise program to facilitate ability to maintain/progress functional gains from skilled physical therapy services. Goal status: MET 05/24/22   3. Patient will demonstrate bilateral hip MMT 5/5 throughout to facilitate improved transfers, ambulation towards independence.  Goal status:on going 06/24/2022   4.  Patient will demonstrate BERG testing > or = 45 to indicate reduced fall risk.  Goal status: on going 06/24/2022   5.  Patient will demonstrate TUG c LRAD < or = 20 seconds to indicate reduced fall risk/ improved community ambulation.    Goal status: on going 07/21/22 37 sec    6.  Patient will demonstrate ability to ambulation c LRAD (SPC, independent) community distances > 300 ft.   Goal status: on going, can do this with RW but not yet with [facility] 07/21/22      PLAN: PT FREQUENCY: 1-2x/week   PT DURATION: 6 more  weeks from recert   PLANNED INTERVENTIONS: Therapeutic exercises, Therapeutic activity, Neuro Muscular re-education, Balance training, Gait training, Patient/Family education, Joint mobilization, Stair training, DME instructions, Dry Needling, Electrical stimulation,  Cryotherapy, Moist heat, Taping, Ultrasound, Ionotophoresis 48m/ml Dexamethasone, and Manual therapy.  All included unless contraindicated   PLAN FOR NEXT SESSION:  Treadmill or bike, [facility], strengthening, stair navigation or step up (towards beginning)   Caedyn Raygoza, SPT  08/12/22 2:38 PM

## 2022-08-16 ENCOUNTER — Ambulatory Visit (INDEPENDENT_AMBULATORY_CARE_PROVIDER_SITE_OTHER): Payer: HMO | Admitting: Physical Therapy

## 2022-08-16 ENCOUNTER — Encounter: Payer: Self-pay | Admitting: Physical Therapy

## 2022-08-16 DIAGNOSIS — R2689 Other abnormalities of gait and mobility: Secondary | ICD-10-CM | POA: Diagnosis not present

## 2022-08-16 DIAGNOSIS — M545 Low back pain, unspecified: Secondary | ICD-10-CM | POA: Diagnosis not present

## 2022-08-16 DIAGNOSIS — R293 Abnormal posture: Secondary | ICD-10-CM

## 2022-08-16 DIAGNOSIS — M6281 Muscle weakness (generalized): Secondary | ICD-10-CM

## 2022-08-16 DIAGNOSIS — M542 Cervicalgia: Secondary | ICD-10-CM | POA: Diagnosis not present

## 2022-08-16 DIAGNOSIS — G8929 Other chronic pain: Secondary | ICD-10-CM

## 2022-08-16 NOTE — Therapy (Signed)
OUTPATIENT PHYSICAL THERAPY TREATMENT NOTE    Patient Name: Claudia [name] MRN: [id number] DOB:1934-03-26, 89 y.o., female Today's Date: 08/16/2022   END OF SESSION:   PT End of Session - 08/16/22 1437     Visit Number 40    Number of Visits 45    Date for PT Re-Evaluation 09/15/22    Authorization Type [employer] $15 copay    Progress Note Due on Visit 44    PT Start Time 1429    PT Stop Time 1515    PT Time Calculation (min) 46 min    Equipment Utilized During Treatment Gait belt    Activity Tolerance Patient tolerated treatment well    Behavior During Therapy WFL for tasks assessed/performed                Past Medical History:  Diagnosis Date   Abnormality of gait    Brachial neuritis or radiculitis NOS    Degeneration of lumbar or lumbosacral intervertebral disc    Dysrhythmia    Essential hypertension, benign    Gouty arthropathy    Lumbago    Obesity    Osteoarthritis    Pure hypercholesterolemia    Unspecified hypothyroidism    Past Surgical History:  Procedure Laterality Date   ABDOMINAL HYSTERECTOMY  03/13/2010   ANTERIOR CERVICAL DECOMP/DISCECTOMY FUSION N/A 10/19/2021   Procedure: C3-4, C4-5, C5-6 ANTERIOR CERVICAL DISCECTOMY FUSION, ALLOGRAFT, PLATE;  Surgeon: [name], MD;  Location: [facility];  Service: Orthopedics;  Laterality: N/A;   BACK SURGERY     LUMBAR LAMINECTOMY  03/13/2010   right knee replacement  03/13/2010   Patient Active Problem List   Diagnosis Date Noted   Vitamin D deficiency disease 06/21/2022   Encounter for general adult medical examination with abnormal findings 06/21/2022   Vitamin B12 deficiency neuropathy ([place]) 02/15/2022   Hypercalcemia 02/15/2022   Cervical stricture or stenosis 10/20/2021   Cervical spinal stenosis 10/19/2021   Meningioma ([place]) 08/24/2021   PAF (paroxysmal atrial fibrillation) ([place]) 11/03/2020   Stage 3b chronic kidney disease ([place]) 05/06/2020   Spinal stenosis in cervical region 05/12/2016    Therapeutic opioid-induced constipation ([facility]) 01/01/2016   Chronic idiopathic constipation 09/11/2013   Lumbosacral spondylosis without myelopathy 04/09/2013   Postlaminectomy syndrome, lumbar region 04/09/2013   Anticoagulation management encounter 02/01/2012   Neuropathy, peripheral 08/04/2011   Pure hypercholesterolemia 08/04/2011   Essential hypertension, benign 08/04/2011   Hypothyroidism 08/04/2011   DJD (degenerative joint disease) of knee 08/04/2011   Gout 08/04/2011      THERAPY DIAG:  Cervicalgia  Other abnormalities of gait and mobility  Muscle weakness (generalized)  Abnormal posture  Chronic bilateral low back pain without sciatica  PCP: Janith Lima. MD   REFERRING PROVIDER: [name], MD   REFERRING DIAG: Z98.1 (ICD-10-CM) - S/P cervical spinal fusion    Rationale for Evaluation and Treatment Rehabilitation   ONSET DATE: Dec 2022   SUBJECTIVE:  SUBJECTIVE STATEMENT: She reports no pan today.   PERTINENT HISTORY:  History of 10/19/21 C3-4 C4-5 C5-6 ACDF with soft collar PMH bracial neuritis, abnormal gait, Degeneration of lumbar, HTN, gouty arthropathy, OA, lumbar laminectomy 2011, Rt TKA   PAIN:  No pain upon arrival.    PRECAUTIONS: None   WEIGHT BEARING RESTRICTIONS No   FALLS:  Has patient fallen in last 6 months? Maybe 1 fall in last 6 months.  Several falls within last year.    LIVING ENVIRONMENT: Lives with:lives alone Lives in: House/apartment Stairs: 3 steps to enter deck, rail on Lt going up.  No stairs in house.  Has following equipment at home: FWW, cane   OCCUPATION: retired    PLOF: Independent  , cooking/cleaning, shopping/grocery store   [place] stronger, walking independently.     OBJECTIVE:     PATIENT SURVEYS:  01/25/2022 No foto - incorrect set up    COGNITION: 01/25/2022 Overall cognitive status: Within functional limits for tasks assessed     SENSATION: 01/25/2022 not tested today   POSTURE:  01/25/2022 rounded shoulders, forward head, increased thoracic kyphosis, and flexed trunk    PALPATION: 01/25/2022 no specific tenderness noted in screening for balance.       MMT: 01/25/2022:  Seated resisted static testing hip abduction Rt: strong/painless. Lt moderate strength, painless (held MMT due to difficulty in bed mobility positioning MMT Right 01/25/2022 Left 01/25/2022 Left 02/22/2022 Left 03/11/2022 Left 03/16/22 Left 04/01/2022 Left 04/05/22 Left/Right 05/24/22 Left/Right 07/21/22  Shoulder flexion             Shoulder extension             Shoulder abduction             Shoulder adduction             Shoulder extension             Shoulder internal rotation             Shoulder external rotation                                         Hip flexion 5/5 4/5 4/5 4/5 4/5 4+/5 4+ 4+/4+ 4+/4+  Hip extension             Hip abduction             Knee flexion 5/5 5/5         Knee extension  5/5 5/5    4+/5 4+ 4+/4+ 4+/4+  Ankle DF 5/5 4/5  4/5 4+/5    5/5   (Blank rows = not tested)   CERVICAL SPECIAL TESTS:  01/25/2022 none performed   FUNCTIONAL TESTS:   07/21/22 TUG with FWW:  06/03/2022:  TUG c FWW:  34.95 seconds, 32.84 seconds  05/24/22: TUG c FWW:  36 seconds  05/06/2022: TUG c FWW:  37 seconds at end of session  04/05/22 TUG 38.5s seconds with FWW 03/22/22: TUG 44 seconds C FWW (done at very end of session) 03/08/2022:   TUG:  41.76 seconds c FWW 01/25/2022        TUG:   71 seconds c FWW        06/24/22 0001  Berg Balance Test  Sit to Stand 3  Standing Unsupported 3  Sitting with Back Unsupported but Feet Supported on Floor or Stool 4  Stand to Sit 3  Transfers 3  Standing Unsupported with Eyes Closed 3  Standing Unsupported with Feet Together 3   From Standing, Reach Forward with Outstretched Arm 3  From Standing Position, Pick up Object from Floor 3  From Standing Position, Turn to Look Behind Over each Shoulder 2  Turn 360 Degrees 1  Standing Unsupported, Alternately Place Feet on Step/Stool 0  Standing Unsupported, One Foot in Front 2  Standing on One Leg 0  Total Score 33  (previously 21 on 01/25/2022       [facility] PT Assessment - 01/25/22 0001                Standardized Balance Assessment    Standardized Balance Assessment Berg Balance Test          Berg Balance Test    Sit to Stand Able to stand using hands after several tries     Standing Unsupported Able to stand 2 minutes with supervision     Sitting with Back Unsupported but Feet Supported on Floor or Stool Able to sit safely and securely 2 minutes     Stand to Sit Uses backs of legs against chair to control descent     Transfers Able to transfer safely, definite need of hands     Standing Unsupported with Eyes Closed Able to stand 3 seconds     Standing Unsupported with Feet Together Needs help to attain position but able to stand for 30 seconds with feet together     From Standing, Reach Forward with Outstretched Arm Can reach forward >5 cm safely (2")     From Standing Position, Pick up Object from Floor Unable to pick up and needs supervision     From Standing Position, Turn to Look Behind Over each Shoulder Needs assist to keep from losing balance and falling     Turn 360 Degrees Needs assistance while turning     Standing Unsupported, Alternately Place Feet on Step/Stool Needs assistance to keep from falling or unable to try     Standing Unsupported, One Foot in Front Needs help to step but can hold 15 seconds     Standing on One Leg Unable to try or needs assist to prevent fall     Total Score 21                GAIT ASSESSMENT: 07/09/2022:  Ambulation mod independent c FWW with marked reduced gait speed, limited in distance due to fatigue (household  distances).  Able to perform ambulation c SPC c CGA to occasional SBA within clinic distances < 100 ft.   03/18/2022:  FWW into clinic c mod independent, decreased gait speed.  SPC use in Rt UE c CGA 33 ft today c cues for sequencing.    TODAY'S TREATMENT:  08/16/22 Therex: -Recumbent bike lvl 3-1 x10 min, seat 8  -Leg press double leg 81# 2x15, single leg 31# 2x15 bilateral -Leg extension machine 5# DL 3X10 -Leg curl machine 20# DL 3X10  TherActivity:   -Rollator ambulation with gait belt throughout clinic around 16f total with CGA (break halfway) -Sit to stands in rollator x5  08/12/22 Therex: -Recumbent bike lvl 3-1 x10 min, seat 8  -Leg press double leg 81# 2x15, single leg 31# 2x15 bilateral -Leg extension machine 5# DL 3X10 -Leg curl machine 20# DL 3X10  TherActivity:   -SPC ambulation with gait belt throughout clinic around 170 ft total (other exercises completed in between) -Navigating up ramp and  down two 4 1/4 in steps with SPC (min to mod A using my arm as support for left) -Navigating up and down 4 1/4 in step with SPC (min to mod A using my arm as support for left)   08/09/22 Therex: -Recumbent bike lvl 3-1 x10 min, seat 8  -Leg press double leg 81# 2x15, single leg 31# 2x15 bilateral -Leg extension machine 5# DL 3X10 -Leg curl machine 15# DL 3X10  TherActivity:   -SPC ambulation with gait belt throughout clinic around 100 ft total. She needed 1 seated rest break in between to complete (CGA) -Navigating up and down ramp with SPC (using UE support as well going up, min A overall)   PATIENT EDUCATION:  05/06/2022 Education details: HEP update Person educated: Patient Education method: Consulting civil engineer, Demonstration, Verbal cues, and Handouts Education comprehension: verbalized understanding, returned demonstration, and verbal cues required     HOME EXERCISE PROGRAM: Access Code: [email or web address] URL: [email or web address] Date: 05/06/2022 Prepared by:  Scot Jun  Exercises - Seated March  - 1-2 x daily - 7 x weekly - 1-2 sets - 10 reps - Sit to Stand  - 2-3 x daily - 7 x weekly - 1 sets - 5-10 reps - Seated Long Arc Quad  - 2-3 x daily - 7 x weekly - 1-2 sets - 10 reps - 2 hold - Standing Scapular Retraction  - 5 x daily - 7 x weekly - 1 sets - 5 reps - 5 second hold - Heel Toe Raises with Counter Support  - 1-2 x daily - 7 x weekly - 1-2 sets - 10 reps - Standing Tandem Balance with Counter Support  - 1-2 x daily - 7 x weekly - 1 sets - 10 reps - Seated Straight Leg Heel Taps  - 1-2 x daily - 7 x weekly - 1-2 sets - 5-10 reps - Seated Quad Set (Mirrored)  - 1-2 x daily - 7 x weekly - 1 sets - 10 reps - 5 hold - Small Range Straight Leg Raise  - 1-2 x daily - 7 x weekly - 1-2 sets - 10-15 reps   ASSESSMENT:   CLINICAL IMPRESSION:  She showed good tolerance with her treatment session today. She was interested in trying the rollator as she has one at home and she had good control with this walker. We practiced stopping, sitting, and standing up from sitting on the rollator seat which she showed good ability to complete these tasks safely. She was encouraged to bring her personal rollator in next session so we can get more practice and she feels more comfortable using it.   OBJECTIVE IMPAIRMENTS Abnormal gait, decreased activity tolerance, decreased balance, decreased endurance, decreased mobility, difficulty walking, decreased strength, impaired perceived functional ability, impaired flexibility, improper body mechanics, postural dysfunction, and pain.    ACTIVITY LIMITATIONS carrying, lifting, bending, standing, squatting, stairs, transfers, bed mobility, reach over head, and locomotion level   PARTICIPATION LIMITATIONS: meal prep, cleaning, interpersonal relationship, driving, shopping, and community activity   PERSONAL FACTORS   History of 10/19/21 C3-4 C4-5 C5-6 ACDF with soft collar PMH bracial neuritis, abnormal gait, Degeneration  of lumbar, HTN, gouty arthropathy, OA, lumbar laminectomy 2011, Rt TKA  are also affecting patient's functional outcome.    REHAB POTENTIAL: Good   CLINICAL DECISION MAKING: Stable/uncomplicated   EVALUATION COMPLEXITY: Low     GOALS: Goals reviewed with patient? Yes   Short term PT Goals (target date for Short term goals are 4 weeks 06/23/22) Patient will  demonstrate independent use of home exercise program to maintain progress from in clinic treatments. Goal status: MET   Long term PT goals (target dates for all long term goals are 10 weeks  08/28/2022 )   1. Patient will demonstrate/report pain at worst less than or equal to 2/10 to facilitate minimal limitation in daily activity secondary to pain symptoms. Goal status: MET 04/05/22   2. Patient will demonstrate independent use of home exercise program to facilitate ability to maintain/progress functional gains from skilled physical therapy services. Goal status: MET 05/24/22   3. Patient will demonstrate bilateral hip MMT 5/5 throughout to facilitate improved transfers, ambulation towards independence.  Goal status:on going 06/24/2022   4.  Patient will demonstrate BERG testing > or = 45 to indicate reduced fall risk.  Goal status: on going 06/24/2022   5.  Patient will demonstrate TUG c LRAD < or = 20 seconds to indicate reduced fall risk/ improved community ambulation.    Goal status: on going 07/21/22 37 sec    6.  Patient will demonstrate ability to ambulation c LRAD (SPC, independent) community distances > 300 ft.   Goal status: on going, can do this with RW but not yet with [facility] 07/21/22      PLAN: PT FREQUENCY: 1-2x/week   PT DURATION: 6 more  weeks from recert   PLANNED INTERVENTIONS: Therapeutic exercises, Therapeutic activity, Neuro Muscular re-education, Balance training, Gait training, Patient/Family education, Joint mobilization, Stair training, DME instructions, Dry Needling, Electrical stimulation, Cryotherapy,  Moist heat, Taping, Ultrasound, Ionotophoresis 51m/ml Dexamethasone, and Manual therapy.  All included unless contraindicated   PLAN FOR NEXT SESSION:  Treadmill or bike, practice with rollator, strengthening, stair navigation or step up (towards beginning)   Braiden Rodman, SPT  08/16/22 3:29 PM

## 2022-08-17 ENCOUNTER — Other Ambulatory Visit: Payer: Self-pay | Admitting: Internal Medicine

## 2022-08-17 DIAGNOSIS — M47817 Spondylosis without myelopathy or radiculopathy, lumbosacral region: Secondary | ICD-10-CM

## 2022-08-17 DIAGNOSIS — M961 Postlaminectomy syndrome, not elsewhere classified: Secondary | ICD-10-CM

## 2022-08-17 DIAGNOSIS — M170 Bilateral primary osteoarthritis of knee: Secondary | ICD-10-CM

## 2022-08-17 NOTE — Telephone Encounter (Signed)
In absence of PCP.Marland KitchenJohny [name]

## 2022-08-19 ENCOUNTER — Encounter: Payer: Self-pay | Admitting: Rehabilitative and Restorative Service Providers"

## 2022-08-19 ENCOUNTER — Ambulatory Visit (INDEPENDENT_AMBULATORY_CARE_PROVIDER_SITE_OTHER): Payer: HMO | Admitting: Rehabilitative and Restorative Service Providers"

## 2022-08-19 DIAGNOSIS — M6281 Muscle weakness (generalized): Secondary | ICD-10-CM | POA: Diagnosis not present

## 2022-08-19 DIAGNOSIS — M545 Low back pain, unspecified: Secondary | ICD-10-CM

## 2022-08-19 DIAGNOSIS — G8929 Other chronic pain: Secondary | ICD-10-CM | POA: Diagnosis not present

## 2022-08-19 DIAGNOSIS — R2689 Other abnormalities of gait and mobility: Secondary | ICD-10-CM

## 2022-08-19 DIAGNOSIS — M542 Cervicalgia: Secondary | ICD-10-CM | POA: Diagnosis not present

## 2022-08-19 DIAGNOSIS — R293 Abnormal posture: Secondary | ICD-10-CM

## 2022-08-19 NOTE — Therapy (Addendum)
OUTPATIENT PHYSICAL THERAPY TREATMENT NOTE    Patient Name: Claudia [name] MRN: [id number] DOB:[birth year], 89 y.o., female Today's Date: 08/19/2022   END OF SESSION:   PT End of Session - 08/19/22 1344     Visit Number 41    Number of Visits 45    Date for PT Re-Evaluation 09/15/22    Authorization Type [employer] $15 copay    Progress Note Due on Visit 55    PT Start Time 1344    PT Stop Time 1426    PT Time Calculation (min) 42 min    Equipment Utilized During Treatment Gait belt    Activity Tolerance Patient tolerated treatment well    Behavior During Therapy WFL for tasks assessed/performed             Past Medical History:  Diagnosis Date   Abnormality of gait    Brachial neuritis or radiculitis NOS    Degeneration of lumbar or lumbosacral intervertebral disc    Dysrhythmia    Essential hypertension, benign    Gouty arthropathy    Lumbago    Obesity    Osteoarthritis    Pure hypercholesterolemia    Unspecified hypothyroidism    Past Surgical History:  Procedure Laterality Date   ABDOMINAL HYSTERECTOMY  03/13/2010   ANTERIOR CERVICAL DECOMP/DISCECTOMY FUSION N/A 10/19/2021   Procedure: C3-4, C4-5, C5-6 ANTERIOR CERVICAL DISCECTOMY FUSION, ALLOGRAFT, PLATE;  Surgeon: [name], MD;  Location: [facility];  Service: Orthopedics;  Laterality: N/A;   BACK SURGERY     LUMBAR LAMINECTOMY  03/13/2010   right knee replacement  03/13/2010   Patient Active Problem List   Diagnosis Date Noted   Vitamin D deficiency disease 06/21/2022   Encounter for general adult medical examination with abnormal findings 06/21/2022   Vitamin B12 deficiency neuropathy ([place]) 02/15/2022   Hypercalcemia 02/15/2022   Cervical stricture or stenosis 10/20/2021   Cervical spinal stenosis 10/19/2021   Meningioma ([place]) 08/24/2021   PAF (paroxysmal atrial fibrillation) ([place]) 11/03/2020   Stage 3b chronic kidney disease ([place]) 05/06/2020   Spinal stenosis in cervical region 05/12/2016    Therapeutic opioid-induced constipation (OIC) 01/01/2016   Chronic idiopathic constipation 09/11/2013   Lumbosacral spondylosis without myelopathy 04/09/2013   Postlaminectomy syndrome, lumbar region 04/09/2013   Anticoagulation management encounter 02/01/2012   Neuropathy, peripheral 08/04/2011   Pure hypercholesterolemia 08/04/2011   Essential hypertension, benign 08/04/2011   Hypothyroidism 08/04/2011   DJD (degenerative joint disease) of knee 08/04/2011   Gout 08/04/2011      THERAPY DIAG:  Cervicalgia  Other abnormalities of gait and mobility  Muscle weakness (generalized)  Abnormal posture  Chronic bilateral low back pain without sciatica  PCP: Janith Lima. MD   REFERRING PROVIDER: [name], MD   REFERRING DIAG: Z98.1 (ICD-10-CM) - S/P cervical spinal fusion    Rationale for Evaluation and Treatment Rehabilitation   ONSET DATE: Dec 2022   SUBJECTIVE:  SUBJECTIVE STATEMENT: She indicated using rollator and cane about 50% /50% at home.  She indicated she only drives her self to clinic at this point.  Has helped when she goes shopping.    PERTINENT HISTORY:  History of 10/19/21 C3-4 C4-5 C5-6 ACDF with soft collar PMH bracial neuritis, abnormal gait, Degeneration of lumbar, HTN, gouty arthropathy, OA, lumbar laminectomy 2011, Rt TKA   PAIN:  No pain upon arrival.    PRECAUTIONS: None   WEIGHT BEARING RESTRICTIONS No   FALLS:  Has patient fallen in last 6 months? Maybe 1 fall in last 6 months.  Several falls within last year.    LIVING ENVIRONMENT: Lives with:lives alone Lives in: House/apartment Stairs: 3 steps to enter deck, rail on Lt going up.  No stairs in house.  Has following equipment at home: FWW, cane   OCCUPATION: retired    PLOF:  Independent  , cooking/cleaning, shopping/grocery store   [place] stronger, walking independently.     OBJECTIVE:    PATIENT SURVEYS:  01/25/2022 No foto - incorrect set up   COGNITION: 01/25/2022 Overall cognitive status: Within functional limits for tasks assessed     SENSATION: 01/25/2022 not tested today   POSTURE:  01/25/2022 rounded shoulders, forward head, increased thoracic kyphosis, and flexed trunk    PALPATION: 01/25/2022 no specific tenderness noted in screening for balance.      MMT: 01/25/2022:  Seated resisted static testing hip abduction Rt: strong/painless. Lt moderate strength, painless (held MMT due to difficulty in bed mobility positioning MMT Right 01/25/2022 Left 01/25/2022 Left 02/22/2022 Left 03/11/2022 Left 03/16/22 Left 04/01/2022 Left 04/05/22 Left/Right 05/24/22 Left/Right 07/21/22  Shoulder flexion             Shoulder extension             Shoulder abduction             Shoulder adduction             Shoulder extension             Shoulder internal rotation             Shoulder external rotation                                         Hip flexion 5/5 4/5 4/5 4/5 4/5 4+/5 4+ 4+/4+ 4+/4+  Hip extension             Hip abduction             Knee flexion 5/5 5/5         Knee extension  5/5 5/5    4+/5 4+ 4+/4+ 4+/4+  Ankle DF 5/5 4/5  4/5 4+/5    5/5   (Blank rows = not tested)   CERVICAL SPECIAL TESTS:  01/25/2022 none performed   FUNCTIONAL TESTS:   07/21/22 TUG with FWW:  06/03/2022:  TUG c FWW:  34.95 seconds, 32.84 seconds  05/24/22: TUG c FWW:  36 seconds  05/06/2022: TUG c FWW:  37 seconds at end of session  04/05/22 TUG 38.5s seconds with FWW 03/22/22: TUG 44 seconds C FWW (done at very end of session) 03/08/2022:   TUG:  41.76 seconds c FWW 01/25/2022        TUG:   71 seconds c FWW     06/24/22 0001  Berg Balance Test  Sit to Stand 3  Standing Unsupported 3  Sitting with Back Unsupported but Feet Supported on Floor or  Stool 4  Stand to Sit 3  Transfers 3  Standing Unsupported with Eyes Closed 3  Standing Unsupported with Feet Together 3  From Standing, Reach Forward with Outstretched Arm 3  From Standing Position, Pick up Object from Floor 3  From Standing Position, Turn to Look Behind Over each Shoulder 2  Turn 360 Degrees 1  Standing Unsupported, Alternately Place Feet on Step/Stool 0  Standing Unsupported, One Foot in Front 2  Standing on One Leg 0  Total Score 33  (previously 21 on 01/25/2022       [facility] PT Assessment - 01/25/22 0001                Standardized Balance Assessment    Standardized Balance Assessment Berg Balance Test          Berg Balance Test    Sit to Stand Able to stand using hands after several tries     Standing Unsupported Able to stand 2 minutes with supervision     Sitting with Back Unsupported but Feet Supported on Floor or Stool Able to sit safely and securely 2 minutes     Stand to Sit Uses backs of legs against chair to control descent     Transfers Able to transfer safely, definite need of hands     Standing Unsupported with Eyes Closed Able to stand 3 seconds     Standing Unsupported with Feet Together Needs help to attain position but able to stand for 30 seconds with feet together     From Standing, Reach Forward with Outstretched Arm Can reach forward >5 cm safely (2")     From Standing Position, Pick up Object from Floor Unable to pick up and needs supervision     From Standing Position, Turn to Look Behind Over each Shoulder Needs assist to keep from losing balance and falling     Turn 360 Degrees Needs assistance while turning     Standing Unsupported, Alternately Place Feet on Step/Stool Needs assistance to keep from falling or unable to try     Standing Unsupported, One Foot in Front Needs help to step but can hold 15 seconds     Standing on One Leg Unable to try or needs assist to prevent fall     Total Score 21                GAIT  ASSESSMENT: 08/19/2022:  Mod independent c FWW and rollator household distances in clinic.   07/09/2022:  Ambulation mod independent c FWW with marked reduced gait speed, limited in distance due to fatigue (household distances).  Able to perform ambulation c SPC c CGA to occasional SBA within clinic distances < 100 ft.   03/18/2022:  FWW into clinic c mod independent, decreased gait speed.  SPC use in Rt UE c CGA 33 ft today c cues for sequencing.    TODAY'S TREATMENT:  08/19/2022 Therex: Recumbent bike lvl 1-2 x10 min, seat 8  Leg press double leg 81# 2x15, single leg 31# x20 bilateral Leg extension machine Double leg up, single leg lowering 2 x 10 5 lbs  Leg curl machine double leg 20 lbs x 15  TherActivity:   Rollator ramp up/down and curb 4 inch step c CGA at times c cues for rollator positioning  - 1 ramp and 2 steps each way.  Additional time required for  education and performance.   08/16/2022 Therex: -Recumbent bike lvl 3-1 x10 min, seat 8  -Leg press double leg 81# 2x15, single leg 31# 2x15 bilateral -Leg extension machine 5# DL 3X10 -Leg curl machine 20# DL 3X10  TherActivity:   -Rollator ambulation with gait belt throughout clinic around 173f total with CGA (break halfway) -Sit to stands in rollator x5  08/12/2022 Therex: -Recumbent bike lvl 3-1 x10 min, seat 8  -Leg press double leg 81# 2x15, single leg 31# 2x15 bilateral -Leg extension machine 5# DL 3X10 -Leg curl machine 20# DL 3X10  TherActivity:   -SPC ambulation with gait belt throughout clinic around 170 ft total (other exercises completed in between) -Navigating up ramp and down two 4 1/4 in steps with SPC (min to mod A using my arm as support for left) -Navigating up and down 4 1/4 in step with SPC (min to mod A using my arm as support for left)     PATIENT EDUCATION:  05/06/2022 Education details: HEP update Person educated: Patient Education method: EConsulting civil engineer Demonstration, Verbal cues, and  Handouts Education comprehension: verbalized understanding, returned demonstration, and verbal cues required     HOME EXERCISE PROGRAM: Access Code: [email or web address]: [email or web address] Date: 05/06/2022 Prepared by: MScot Jun Exercises - Seated March  - 1-2 x daily - 7 x weekly - 1-2 sets - 10 reps - Sit to Stand  - 2-3 x daily - 7 x weekly - 1 sets - 5-10 reps - Seated Long Arc Quad  - 2-3 x daily - 7 x weekly - 1-2 sets - 10 reps - 2 hold - Standing Scapular Retraction  - 5 x daily - 7 x weekly - 1 sets - 5 reps - 5 second hold - Heel Toe Raises with Counter Support  - 1-2 x daily - 7 x weekly - 1-2 sets - 10 reps - Standing Tandem Balance with Counter Support  - 1-2 x daily - 7 x weekly - 1 sets - 10 reps - Seated Straight Leg Heel Taps  - 1-2 x daily - 7 x weekly - 1-2 sets - 5-10 reps - Seated Quad Set (Mirrored)  - 1-2 x daily - 7 x weekly - 1 sets - 10 reps - 5 hold - Small Range Straight Leg Raise  - 1-2 x daily - 7 x weekly - 1-2 sets - 10-15 reps   ASSESSMENT:   CLINICAL IMPRESSION:  Fair control on ramp and curb, in part impacted by strength deficits in Lt hand and its impact on ability to hold brakes in movements.  Pt to continue to benefit from functional movement endurance improvements, LE strengthening.   OBJECTIVE IMPAIRMENTS Abnormal gait, decreased activity tolerance, decreased balance, decreased endurance, decreased mobility, difficulty walking, decreased strength, impaired perceived functional ability, impaired flexibility, improper body mechanics, postural dysfunction, and pain.    ACTIVITY LIMITATIONS carrying, lifting, bending, standing, squatting, stairs, transfers, bed mobility, reach over head, and locomotion level   PARTICIPATION LIMITATIONS: meal prep, cleaning, interpersonal relationship, driving, shopping, and community activity   PERSONAL FACTORS   History of 10/19/21 C3-4 C4-5 C5-6 ACDF with soft collar PMH bracial neuritis, abnormal  gait, Degeneration of lumbar, HTN, gouty arthropathy, OA, lumbar laminectomy 2011, Rt TKA  are also affecting patient's functional outcome.    REHAB POTENTIAL: Good   CLINICAL DECISION MAKING: Stable/uncomplicated   EVALUATION COMPLEXITY: Low     GOALS: Goals reviewed with patient? Yes   Short term PT Goals (target date for Short term goals  are 4 weeks 06/23/22) Patient will demonstrate independent use of home exercise program to maintain progress from in clinic treatments. Goal status: MET   Long term PT goals (target dates for all long term goals are 10 weeks  08/28/2022 )   1. Patient will demonstrate/report pain at worst less than or equal to 2/10 to facilitate minimal limitation in daily activity secondary to pain symptoms. Goal status: MET 04/05/22   2. Patient will demonstrate independent use of home exercise program to facilitate ability to maintain/progress functional gains from skilled physical therapy services. Goal status: MET 05/24/22   3. Patient will demonstrate bilateral hip MMT 5/5 throughout to facilitate improved transfers, ambulation towards independence.  Goal status:on going 06/24/2022   4.  Patient will demonstrate BERG testing > or = 45 to indicate reduced fall risk.  Goal status: on going 06/24/2022   5.  Patient will demonstrate TUG c LRAD < or = 20 seconds to indicate reduced fall risk/ improved community ambulation.    Goal status: on going 07/21/22 37 sec    6.  Patient will demonstrate ability to ambulation c LRAD (SPC, independent) community distances > 300 ft.   Goal status: on going, can do this with RW but not yet with [facility] 07/21/22      PLAN: PT FREQUENCY: 1-2x/week   PT DURATION: 6 more  weeks from recert   PLANNED INTERVENTIONS: Therapeutic exercises, Therapeutic activity, Neuro Muscular re-education, Balance training, Gait training, Patient/Family education, Joint mobilization, Stair training, DME instructions, Dry Needling, Electrical  stimulation, Cryotherapy, Moist heat, Taping, Ultrasound, Ionotophoresis 63m/ml Dexamethasone, and Manual therapy.  All included unless contraindicated   PLAN FOR NEXT SESSION:  Rollator training continued in community environment activities.  UE strengthening ideas for grip and also for moving rollator into/out of car.     MScot Jun PT, DPT, OCS, ATC 08/19/22  2:49 PM

## 2022-08-23 ENCOUNTER — Ambulatory Visit (INDEPENDENT_AMBULATORY_CARE_PROVIDER_SITE_OTHER): Payer: HMO | Admitting: Physical Therapy

## 2022-08-23 ENCOUNTER — Encounter: Payer: Self-pay | Admitting: Physical Therapy

## 2022-08-23 DIAGNOSIS — M6281 Muscle weakness (generalized): Secondary | ICD-10-CM

## 2022-08-23 DIAGNOSIS — M545 Low back pain, unspecified: Secondary | ICD-10-CM | POA: Diagnosis not present

## 2022-08-23 DIAGNOSIS — M542 Cervicalgia: Secondary | ICD-10-CM

## 2022-08-23 DIAGNOSIS — R293 Abnormal posture: Secondary | ICD-10-CM

## 2022-08-23 DIAGNOSIS — R2689 Other abnormalities of gait and mobility: Secondary | ICD-10-CM | POA: Diagnosis not present

## 2022-08-23 DIAGNOSIS — G8929 Other chronic pain: Secondary | ICD-10-CM | POA: Diagnosis not present

## 2022-08-23 NOTE — Therapy (Signed)
as OUTPATIENT PHYSICAL THERAPY TREATMENT NOTE    Patient Name: Claudia [name] MRN: [id number] DOB:1934/06/27, 89 y.o., female Today's Date: 08/23/2022   END OF SESSION:   PT End of Session - 08/23/22 1303     Visit Number 42    Number of Visits 45    Date for PT Re-Evaluation 09/15/22    Authorization Type [employer] $15 copay    Progress Note Due on Visit 10    PT Start Time 1302    PT Stop Time 1345    PT Time Calculation (min) 43 min    Equipment Utilized During Treatment Gait belt    Activity Tolerance Patient tolerated treatment well    Behavior During Therapy WFL for tasks assessed/performed             Past Medical History:  Diagnosis Date   Abnormality of gait    Brachial neuritis or radiculitis NOS    Degeneration of lumbar or lumbosacral intervertebral disc    Dysrhythmia    Essential hypertension, benign    Gouty arthropathy    Lumbago    Obesity    Osteoarthritis    Pure hypercholesterolemia    Unspecified hypothyroidism    Past Surgical History:  Procedure Laterality Date   ABDOMINAL HYSTERECTOMY  03/13/2010   ANTERIOR CERVICAL DECOMP/DISCECTOMY FUSION N/A 10/19/2021   Procedure: C3-4, C4-5, C5-6 ANTERIOR CERVICAL DISCECTOMY FUSION, ALLOGRAFT, PLATE;  Surgeon: [name], MD;  Location: [facility];  Service: Orthopedics;  Laterality: N/A;   BACK SURGERY     LUMBAR LAMINECTOMY  03/13/2010   right knee replacement  03/13/2010   Patient Active Problem List   Diagnosis Date Noted   Vitamin D deficiency disease 06/21/2022   Encounter for general adult medical examination with abnormal findings 06/21/2022   Vitamin B12 deficiency neuropathy ([place]) 02/15/2022   Hypercalcemia 02/15/2022   Cervical stricture or stenosis 10/20/2021   Cervical spinal stenosis 10/19/2021   Meningioma ([place]) 08/24/2021   PAF (paroxysmal atrial fibrillation) ([place]) 11/03/2020   Stage 3b chronic kidney disease ([place]) 05/06/2020   Spinal stenosis in cervical region 05/12/2016    Therapeutic opioid-induced constipation ([facility]) 01/01/2016   Chronic idiopathic constipation 09/11/2013   Lumbosacral spondylosis without myelopathy 04/09/2013   Postlaminectomy syndrome, lumbar region 04/09/2013   Anticoagulation management encounter 02/01/2012   Neuropathy, peripheral 08/04/2011   Pure hypercholesterolemia 08/04/2011   Essential hypertension, benign 08/04/2011   Hypothyroidism 08/04/2011   DJD (degenerative joint disease) of knee 08/04/2011   Gout 08/04/2011      THERAPY DIAG:  Cervicalgia  Other abnormalities of gait and mobility  Muscle weakness (generalized)  Abnormal posture  Chronic bilateral low back pain without sciatica  PCP: Janith Lima. MD   REFERRING PROVIDER: [name], MD   REFERRING DIAG: Z98.1 (ICD-10-CM) - S/P cervical spinal fusion    Rationale for Evaluation and Treatment Rehabilitation   ONSET DATE: Dec 2022   SUBJECTIVE:  SUBJECTIVE STATEMENT: She reports she is doing well today. She said she has been using the rollator at home and it has been going well.   PERTINENT HISTORY:  History of 10/19/21 C3-4 C4-5 C5-6 ACDF with soft collar PMH bracial neuritis, abnormal gait, Degeneration of lumbar, HTN, gouty arthropathy, OA, lumbar laminectomy 2011, Rt TKA   PAIN:  No pain upon arrival.    PRECAUTIONS: None   WEIGHT BEARING RESTRICTIONS No   FALLS:  Has patient fallen in last 6 months? Maybe 1 fall in last 6 months.  Several falls within last year.    LIVING ENVIRONMENT: Lives with:lives alone Lives in: House/apartment Stairs: 3 steps to enter deck, rail on Lt going up.  No stairs in house.  Has following equipment at home: FWW, cane   OCCUPATION: retired    PLOF: Independent  , cooking/cleaning, shopping/grocery  store   [place] stronger, walking independently.     OBJECTIVE:    PATIENT SURVEYS:  01/25/2022 No foto - incorrect set up   COGNITION: 01/25/2022 Overall cognitive status: Within functional limits for tasks assessed     SENSATION: 01/25/2022 not tested today   POSTURE:  01/25/2022 rounded shoulders, forward head, increased thoracic kyphosis, and flexed trunk    PALPATION: 01/25/2022 no specific tenderness noted in screening for balance.      MMT: 01/25/2022:  Seated resisted static testing hip abduction Rt: strong/painless. Lt moderate strength, painless (held MMT due to difficulty in bed mobility positioning MMT Right 01/25/2022 Left 01/25/2022 Left 02/22/2022 Left 03/11/2022 Left 03/16/22 Left 04/01/2022 Left 04/05/22 Left/Right 05/24/22 Left/Right 07/21/22  Shoulder flexion             Shoulder extension             Shoulder abduction             Shoulder adduction             Shoulder extension             Shoulder internal rotation             Shoulder external rotation                                         Hip flexion 5/5 4/5 4/5 4/5 4/5 4+/5 4+ 4+/4+ 4+/4+  Hip extension             Hip abduction             Knee flexion 5/5 5/5         Knee extension  5/5 5/5    4+/5 4+ 4+/4+ 4+/4+  Ankle DF 5/5 4/5  4/5 4+/5    5/5   (Blank rows = not tested)   CERVICAL SPECIAL TESTS:  01/25/2022 none performed   FUNCTIONAL TESTS:   07/21/22 TUG with FWW:  06/03/2022:  TUG c FWW:  34.95 seconds, 32.84 seconds  05/24/22: TUG c FWW:  36 seconds  05/06/2022: TUG c FWW:  37 seconds at end of session  04/05/22 TUG 38.5s seconds with FWW 03/22/22: TUG 44 seconds C FWW (done at very end of session) 03/08/2022:   TUG:  41.76 seconds c FWW 01/25/2022        TUG:   71 seconds c FWW     06/24/22 0001  Berg Balance Test  Sit to Stand 3  Standing Unsupported 3  Sitting with Back Unsupported but Feet Supported on Floor or Stool 4  Stand to Sit 3  Transfers 3  Standing  Unsupported with Eyes Closed 3  Standing Unsupported with Feet Together 3  From Standing, Reach Forward with Outstretched Arm 3  From Standing Position, Pick up Object from Floor 3  From Standing Position, Turn to Look Behind Over each Shoulder 2  Turn 360 Degrees 1  Standing Unsupported, Alternately Place Feet on Step/Stool 0  Standing Unsupported, One Foot in Front 2  Standing on One Leg 0  Total Score 33  (previously 21 on 01/25/2022       [facility] PT Assessment - 01/25/22 0001                Standardized Balance Assessment    Standardized Balance Assessment Berg Balance Test          Berg Balance Test    Sit to Stand Able to stand using hands after several tries     Standing Unsupported Able to stand 2 minutes with supervision     Sitting with Back Unsupported but Feet Supported on Floor or Stool Able to sit safely and securely 2 minutes     Stand to Sit Uses backs of legs against chair to control descent     Transfers Able to transfer safely, definite need of hands     Standing Unsupported with Eyes Closed Able to stand 3 seconds     Standing Unsupported with Feet Together Needs help to attain position but able to stand for 30 seconds with feet together     From Standing, Reach Forward with Outstretched Arm Can reach forward >5 cm safely (2")     From Standing Position, Pick up Object from Floor Unable to pick up and needs supervision     From Standing Position, Turn to Look Behind Over each Shoulder Needs assist to keep from losing balance and falling     Turn 360 Degrees Needs assistance while turning     Standing Unsupported, Alternately Place Feet on Step/Stool Needs assistance to keep from falling or unable to try     Standing Unsupported, One Foot in Front Needs help to step but can hold 15 seconds     Standing on One Leg Unable to try or needs assist to prevent fall     Total Score 21                GAIT ASSESSMENT: 08/19/2022:  Mod independent c FWW and rollator  household distances in clinic.   07/09/2022:  Ambulation mod independent c FWW with marked reduced gait speed, limited in distance due to fatigue (household distances).  Able to perform ambulation c SPC c CGA to occasional SBA within clinic distances < 100 ft.   03/18/2022:  FWW into clinic c mod independent, decreased gait speed.  SPC use in Rt UE c CGA 33 ft today c cues for sequencing.    TODAY'S TREATMENT:  08/23/2022 Therex: Recumbent bike lvl 3-1 x10 min, seat 8  Leg press double leg 81# 2x15, single leg 31# x20 bilateral Leg extension machine Double leg up, single leg lowering 3 x 10 5 lbs  Leg curl machine double leg 20 lbs 3x10  TherActivity:   -Rollator ramp up/down with CGA  -Rollator up/down 4 inch step c CGA at times c cues for rollator positioning   - Rollator up ramp and down 2 steps with CGA  08/19/2022 Therex: Recumbent bike lvl 1-2 x10 min,  seat 8  Leg press double leg 81# 2x15, single leg 31# x20 bilateral Leg extension machine Double leg up, single leg lowering 2 x 10 5 lbs  Leg curl machine double leg 20 lbs x 15  TherActivity:   Rollator ramp up/down and curb 4 inch step c CGA at times c cues for rollator positioning  - 1 ramp and 2 steps each way.  Additional time required for education and performance.   08/16/2022 Therex: -Recumbent bike lvl 3-1 x10 min, seat 8  -Leg press double leg 81# 2x15, single leg 31# 2x15 bilateral -Leg extension machine 5# DL 3X10 -Leg curl machine 20# DL 3X10  TherActivity:   -Rollator ambulation with gait belt throughout clinic around 193f total with CGA (break halfway) -Sit to stands in rollator x5    PATIENT EDUCATION:  05/06/2022 Education details: HEP update Person educated: Patient Education method: EConsulting civil engineer Demonstration, Verbal cues, and Handouts Education comprehension: verbalized understanding, returned demonstration, and verbal cues required     HOME EXERCISE PROGRAM: Access Code: [email or web address]:  [email or web address] Date: 05/06/2022 Prepared by: MScot Jun Exercises - Seated March  - 1-2 x daily - 7 x weekly - 1-2 sets - 10 reps - Sit to Stand  - 2-3 x daily - 7 x weekly - 1 sets - 5-10 reps - Seated Long Arc Quad  - 2-3 x daily - 7 x weekly - 1-2 sets - 10 reps - 2 hold - Standing Scapular Retraction  - 5 x daily - 7 x weekly - 1 sets - 5 reps - 5 second hold - Heel Toe Raises with Counter Support  - 1-2 x daily - 7 x weekly - 1-2 sets - 10 reps - Standing Tandem Balance with Counter Support  - 1-2 x daily - 7 x weekly - 1 sets - 10 reps - Seated Straight Leg Heel Taps  - 1-2 x daily - 7 x weekly - 1-2 sets - 5-10 reps - Seated Quad Set (Mirrored)  - 1-2 x daily - 7 x weekly - 1 sets - 10 reps - 5 hold - Small Range Straight Leg Raise  - 1-2 x daily - 7 x weekly - 1-2 sets - 10-15 reps   ASSESSMENT:   CLINICAL IMPRESSION:  COscarhas a progress due next visit and we will assess her need for more PT. As of now we believe she would benefit from more skilled PT to address strength and endurance deficits. She has started using rollator more recently and while she has good control navigating in the clinic, but would benefit from skilled interventions to get more comfortable using rollator outside of the house.   OBJECTIVE IMPAIRMENTS Abnormal gait, decreased activity tolerance, decreased balance, decreased endurance, decreased mobility, difficulty walking, decreased strength, impaired perceived functional ability, impaired flexibility, improper body mechanics, postural dysfunction, and pain.    ACTIVITY LIMITATIONS carrying, lifting, bending, standing, squatting, stairs, transfers, bed mobility, reach over head, and locomotion level   PARTICIPATION LIMITATIONS: meal prep, cleaning, interpersonal relationship, driving, shopping, and community activity   PERSONAL FACTORS   History of 10/19/21 C3-4 C4-5 C5-6 ACDF with soft collar PMH bracial neuritis, abnormal gait,  Degeneration of lumbar, HTN, gouty arthropathy, OA, lumbar laminectomy 2011, Rt TKA  are also affecting patient's functional outcome.    REHAB POTENTIAL: Good   CLINICAL DECISION MAKING: Stable/uncomplicated   EVALUATION COMPLEXITY: Low     GOALS: Goals reviewed with patient? Yes   Short term PT Goals (target date for Short  term goals are 4 weeks 06/23/22) Patient will demonstrate independent use of home exercise program to maintain progress from in clinic treatments. Goal status: MET   Long term PT goals (target dates for all long term goals are 10 weeks  08/28/2022 )   1. Patient will demonstrate/report pain at worst less than or equal to 2/10 to facilitate minimal limitation in daily activity secondary to pain symptoms. Goal status: MET 04/05/22   2. Patient will demonstrate independent use of home exercise program to facilitate ability to maintain/progress functional gains from skilled physical therapy services. Goal status: MET 05/24/22   3. Patient will demonstrate bilateral hip MMT 5/5 throughout to facilitate improved transfers, ambulation towards independence.  Goal status:on going 06/24/2022   4.  Patient will demonstrate BERG testing > or = 45 to indicate reduced fall risk.  Goal status: on going 06/24/2022   5.  Patient will demonstrate TUG c LRAD < or = 20 seconds to indicate reduced fall risk/ improved community ambulation.    Goal status: on going 07/21/22 37 sec    6.  Patient will demonstrate ability to ambulation c LRAD (SPC, independent) community distances > 300 ft.   Goal status: on going, can do this with RW but not yet with [facility] 07/21/22      PLAN: PT FREQUENCY: 1-2x/week   PT DURATION: 6 more  weeks from recert   PLANNED INTERVENTIONS: Therapeutic exercises, Therapeutic activity, Neuro Muscular re-education, Balance training, Gait training, Patient/Family education, Joint mobilization, Stair training, DME instructions, Dry Needling, Electrical stimulation,  Cryotherapy, Moist heat, Taping, Ultrasound, Ionotophoresis '4mg'$ /ml Dexamethasone, and Manual therapy.  All included unless contraindicated   PLAN FOR NEXT SESSION:  Rollator training continued in community environment activities.  UE strengthening ideas for grip and also for moving rollator into/out of car. Progress note due next visit.     Giannie Soliday, SPT 08/23/22  1:51 PM

## 2022-08-25 DIAGNOSIS — M47817 Spondylosis without myelopathy or radiculopathy, lumbosacral region: Secondary | ICD-10-CM | POA: Diagnosis not present

## 2022-08-26 ENCOUNTER — Ambulatory Visit (INDEPENDENT_AMBULATORY_CARE_PROVIDER_SITE_OTHER): Payer: HMO | Admitting: Rehabilitative and Restorative Service Providers"

## 2022-08-26 ENCOUNTER — Encounter: Payer: Self-pay | Admitting: Rehabilitative and Restorative Service Providers"

## 2022-08-26 DIAGNOSIS — G8929 Other chronic pain: Secondary | ICD-10-CM | POA: Diagnosis not present

## 2022-08-26 DIAGNOSIS — R293 Abnormal posture: Secondary | ICD-10-CM | POA: Diagnosis not present

## 2022-08-26 DIAGNOSIS — R2689 Other abnormalities of gait and mobility: Secondary | ICD-10-CM

## 2022-08-26 DIAGNOSIS — M6281 Muscle weakness (generalized): Secondary | ICD-10-CM | POA: Diagnosis not present

## 2022-08-26 DIAGNOSIS — M545 Low back pain, unspecified: Secondary | ICD-10-CM

## 2022-08-26 DIAGNOSIS — M542 Cervicalgia: Secondary | ICD-10-CM

## 2022-08-26 NOTE — Progress Notes (Signed)
   08/26/22 0001  Berg Balance Test  Sit to Stand 3  Standing Unsupported 3  Sitting with Back Unsupported but Feet Supported on Floor or Stool 4  Stand to Sit 3  Transfers 3  Standing Unsupported with Eyes Closed 3  Standing Unsupported with Feet Together 3  From Standing, Reach Forward with Outstretched Arm 1  From Standing Position, Pick up Object from Floor 3  From Standing Position, Turn to Look Behind Over each Shoulder 2  Turn 360 Degrees 1  Standing Unsupported, Alternately Place Feet on Step/Stool 0  Standing Unsupported, One Foot in Front 2  Standing on One Leg 0  Total Score 31

## 2022-08-26 NOTE — Therapy (Signed)
OUTPATIENT PHYSICAL THERAPY TREATMENT NOTE /PROGRESS/ RECERT    Patient Name: Claudia [name] MRN: [id number] DOB:1934/06/22, 89 y.o., female Today's Date: 08/26/2022   Progress Note Reporting Period 07/21/2022 to 08/26/2022  See note below for Objective Data and Assessment of Progress/Goals.   END OF SESSION:   PT End of Session - 08/26/22 1256     Visit Number 43    Number of Visits 50    Date for PT Re-Evaluation 09/23/22    Authorization Type [employer] $15 copay    Progress Note Due on Visit 40    PT Start Time 1258    PT Stop Time 1338    PT Time Calculation (min) 40 min    Equipment Utilized During Treatment Gait belt    Activity Tolerance Patient tolerated treatment well    Behavior During Therapy WFL for tasks assessed/performed              Past Medical History:  Diagnosis Date   Abnormality of gait    Brachial neuritis or radiculitis NOS    Degeneration of lumbar or lumbosacral intervertebral disc    Dysrhythmia    Essential hypertension, benign    Gouty arthropathy    Lumbago    Obesity    Osteoarthritis    Pure hypercholesterolemia    Unspecified hypothyroidism    Past Surgical History:  Procedure Laterality Date   ABDOMINAL HYSTERECTOMY  03/13/2010   ANTERIOR CERVICAL DECOMP/DISCECTOMY FUSION N/A 10/19/2021   Procedure: C3-4, C4-5, C5-6 ANTERIOR CERVICAL DISCECTOMY FUSION, ALLOGRAFT, PLATE;  Surgeon: [name], MD;  Location: [facility];  Service: Orthopedics;  Laterality: N/A;   BACK SURGERY     LUMBAR LAMINECTOMY  03/13/2010   right knee replacement  03/13/2010   Patient Active Problem List   Diagnosis Date Noted   Vitamin D deficiency disease 06/21/2022   Encounter for general adult medical examination with abnormal findings 06/21/2022   Vitamin B12 deficiency neuropathy ([place]) 02/15/2022   Hypercalcemia 02/15/2022   Cervical stricture or stenosis 10/20/2021   Cervical spinal stenosis 10/19/2021   Meningioma ([place]) 08/24/2021   PAF  (paroxysmal atrial fibrillation) ([place]) 11/03/2020   Stage 3b chronic kidney disease ([place]) 05/06/2020   Spinal stenosis in cervical region 05/12/2016   Therapeutic opioid-induced constipation ([facility]) 01/01/2016   Chronic idiopathic constipation 09/11/2013   Lumbosacral spondylosis without myelopathy 04/09/2013   Postlaminectomy syndrome, lumbar region 04/09/2013   Anticoagulation management encounter 02/01/2012   Neuropathy, peripheral 08/04/2011   Pure hypercholesterolemia 08/04/2011   Essential hypertension, benign 08/04/2011   Hypothyroidism 08/04/2011   DJD (degenerative joint disease) of knee 08/04/2011   Gout 08/04/2011      THERAPY DIAG:  Cervicalgia  Other abnormalities of gait and mobility  Muscle weakness (generalized)  Abnormal posture  Chronic bilateral low back pain without sciatica  PCP: Janith Lima. MD   REFERRING PROVIDER: [name], MD   REFERRING DIAG: Z98.1 (ICD-10-CM) - S/P cervical spinal fusion    Rationale for Evaluation and Treatment Rehabilitation   ONSET DATE: Dec 2022   SUBJECTIVE:  SUBJECTIVE STATEMENT: She indicated no pain complaints.  She indicated she felt like she needs to continue to keep up with her improvements.    Global Rating of Change since evaluation:  +5 quite a bit better     PERTINENT HISTORY:  History of 10/19/21 C3-4 C4-5 C5-6 ACDF with soft collar PMH bracial neuritis, abnormal gait, Degeneration of lumbar, HTN, gouty arthropathy, OA, lumbar laminectomy 2011, Rt TKA   PAIN:  No pain upon arrival.    PRECAUTIONS: None   WEIGHT BEARING RESTRICTIONS No   FALLS:  Has patient fallen in last 6 months? Maybe 1 fall in last 6 months.  Several falls within last year.    LIVING ENVIRONMENT: Lives with:lives  alone Lives in: House/apartment Stairs: 3 steps to enter deck, rail on Lt going up.  No stairs in house.  Has following equipment at home: FWW, cane   OCCUPATION: retired    PLOF: Independent  , cooking/cleaning, shopping/grocery store   [place] stronger, walking independently.     OBJECTIVE:    PATIENT SURVEYS:  01/25/2022 No foto - incorrect set up   COGNITION: 01/25/2022 Overall cognitive status: Within functional limits for tasks assessed    SENSATION: 01/25/2022 not tested today   POSTURE:  01/25/2022 rounded shoulders, forward head, increased thoracic kyphosis, and flexed trunk    PALPATION: 01/25/2022 no specific tenderness noted in screening for balance.      MMT: 01/25/2022:  Seated resisted static testing hip abduction Rt: strong/painless. Lt moderate strength, painless (held MMT due to difficulty in bed mobility positioning MMT Right 01/25/2022 Left 01/25/2022 Left 02/22/2022 Left 03/11/2022 Left 03/16/22 Left 04/01/2022 Left 04/05/22 Left/Right 05/24/22 Left/Right 07/21/22 Right/ Left 08/26/2022  Shoulder flexion              Shoulder extension              Shoulder abduction              Shoulder adduction              Shoulder extension              Shoulder internal rotation              Shoulder external rotation                                            Hip flexion 5/5 4/5 4/5 4/5 4/5 4+/5 4+ 4+/4+ 4+/4+ 5  /  4+  Hip extension              Hip abduction              Knee flexion 5/5 5/5        5   /   5  Knee extension  5/5 5/5    4+/5 4+ 4+/4+ 4+/4+ 5   / 4+  Ankle DF 5/5 4/5  4/5 4+/5    5/5 5  /  4+   (Blank rows = not tested)   CERVICAL SPECIAL TESTS:  01/25/2022 none performed   FUNCTIONAL TESTS:  08/26/2022:   TUG with FWW: 33.1 seconds   TUG with rollator: 30.4 seconds   06/03/2022:  TUG c FWW:  34.95 seconds, 32.84 seconds 05/24/22: TUG c FWW:  36 seconds 05/06/2022: TUG c FWW:  37 seconds at end of session 04/05/22 TUG 38.5s  seconds with FWW 03/22/22: TUG 44 seconds C FWW (done at very end of session) 03/08/2022:   TUG:  41.76 seconds c FWW 01/25/2022        TUG:   71 seconds c FWW   [facility] PT Assessment - 08/26/22 0001       Berg Balance Test   Sit to Stand Able to stand  independently using hands    Standing Unsupported Able to stand 2 minutes with supervision    Sitting with Back Unsupported but Feet Supported on Floor or Stool Able to sit safely and securely 2 minutes    Stand to Sit Controls descent by using hands    Transfers Able to transfer safely, definite need of hands    Standing Unsupported with Eyes Closed Able to stand 10 seconds with supervision    Standing Unsupported with Feet Together Able to place feet together independently and stand for 1 minute with supervision    From Standing, Reach Forward with Outstretched Arm Reaches forward but needs supervision    From Standing Position, Pick up Object from Floor Able to pick up shoe, needs supervision    From Standing Position, Turn to Look Behind Over each Shoulder Turn sideways only but maintains balance    Turn 360 Degrees Needs close supervision or verbal cueing    Standing Unsupported, Alternately Place Feet on Step/Stool Needs assistance to keep from falling or unable to try    Standing Unsupported, One Foot in [place] to take small step independently and hold 30 seconds    Standing on One Leg Unable to try or needs assist to prevent fall    Total Score 31             08/26/22 0001  Berg Balance Test  Sit to Stand 3  Standing Unsupported 3  Sitting with Back Unsupported but Feet Supported on Floor or Stool 4  Stand to Sit 3  Transfers 3  Standing Unsupported with Eyes Closed 3  Standing Unsupported with Feet Together 3  From Standing, Reach Forward with Outstretched Arm 1  From Standing Position, Pick up Object from Floor 3  From Standing Position, Turn to Look Behind Over each Shoulder 2  Turn 360 Degrees 1  Standing  Unsupported, Alternately Place Feet on Step/Stool 0  Standing Unsupported, One Foot in Front 2  Standing on One Leg 0  Total Score 31       06/24/22 0001  Berg Balance Test  Sit to Stand 3  Standing Unsupported 3  Sitting with Back Unsupported but Feet Supported on Floor or Stool 4  Stand to Sit 3  Transfers 3  Standing Unsupported with Eyes Closed 3  Standing Unsupported with Feet Together 3  From Standing, Reach Forward with Outstretched Arm 3  From Standing Position, Pick up Object from Floor 3  From Standing Position, Turn to Look Behind Over each Shoulder 2  Turn 360 Degrees 1  Standing Unsupported, Alternately Place Feet on Step/Stool 0  Standing Unsupported, One Foot in Front 2  Standing on One Leg 0  Total Score 33  (previously 21 on 01/25/2022    01/25/2022 BERG 21    GAIT ASSESSMENT: 08/26/2022:  Mod independent c rollator : 250 ft prior to fatigue.   2 min walk test 125 ft c rollator  08/19/2022:  Mod independent c FWW and rollator household distances in clinic.   07/09/2022:  Ambulation mod independent c FWW with marked reduced gait speed, limited in distance due  to fatigue (household distances).  Able to perform ambulation c SPC c CGA to occasional SBA within clinic distances < 100 ft.   03/18/2022:  FWW into clinic c mod independent, decreased gait speed.  SPC use in Rt UE c CGA 33 ft today c cues for sequencing.    TODAY'S TREATMENT:  08/26/2022: Therex: Recumbent bike Lvl 1 10 mins seat 8 Sit to stand to sit x 10 18 inch chair c hand assist bilateral as necessary    Physical Performance Testing: Extended time for education, cues and performance of testing listed above including BERG balance testing, TUG with rollator and with FWW, 2 min walk test/distance walk testing.     08/23/2022 Therex: Recumbent bike lvl 3-1 x10 min, seat 8  Leg press double leg 81# 2x15, single leg 31# x20 bilateral Leg extension machine Double leg up, single leg lowering 3 x 10 5  lbs  Leg curl machine double leg 20 lbs 3x10  TherActivity:   -Rollator ramp up/down with CGA  -Rollator up/down 4 inch step c CGA at times c cues for rollator positioning   - Rollator up ramp and down 2 steps with CGA  08/19/2022 Therex: Recumbent bike lvl 1-2 x10 min, seat 8  Leg press double leg 81# 2x15, single leg 31# x20 bilateral Leg extension machine Double leg up, single leg lowering 2 x 10 5 lbs  Leg curl machine double leg 20 lbs x 15  TherActivity:   Rollator ramp up/down and curb 4 inch step c CGA at times c cues for rollator positioning  - 1 ramp and 2 steps each way.  Additional time required for education and performance.     PATIENT EDUCATION:  05/06/2022 Education details: HEP update Person educated: Patient Education method: Consulting civil engineer, Demonstration, Verbal cues, and Handouts Education comprehension: verbalized understanding, returned demonstration, and verbal cues required     HOME EXERCISE PROGRAM: Access Code: [email or web address] URL: [email or web address] Date: 05/06/2022 Prepared by: Scot Jun  Exercises - Seated March  - 1-2 x daily - 7 x weekly - 1-2 sets - 10 reps - Sit to Stand  - 2-3 x daily - 7 x weekly - 1 sets - 5-10 reps - Seated Long Arc Quad  - 2-3 x daily - 7 x weekly - 1-2 sets - 10 reps - 2 hold - Standing Scapular Retraction  - 5 x daily - 7 x weekly - 1 sets - 5 reps - 5 second hold - Heel Toe Raises with Counter Support  - 1-2 x daily - 7 x weekly - 1-2 sets - 10 reps - Standing Tandem Balance with Counter Support  - 1-2 x daily - 7 x weekly - 1 sets - 10 reps - Seated Straight Leg Heel Taps  - 1-2 x daily - 7 x weekly - 1-2 sets - 5-10 reps - Seated Quad Set (Mirrored)  - 1-2 x daily - 7 x weekly - 1 sets - 10 reps - 5 hold - Small Range Straight Leg Raise  - 1-2 x daily - 7 x weekly - 1-2 sets - 10-15 reps   ASSESSMENT:   CLINICAL IMPRESSION:  Pt has attended 10 visits since last progress note and 43 visits overall  during course of treatment.  She has reported global rating of change +5 at this time with improvements in her subjective reporting of difficulty c mobility at home. See objective data for updated information regarding current presentation.  Overall progress has been slower and incremental due to  medical history of condition but gains have been noted in stability/quality of movements and data gathered compared to evaluation.  Pt may continue to benefit from skilled PT services to continue to improve in fall risk reduction.   OBJECTIVE IMPAIRMENTS Abnormal gait, decreased activity tolerance, decreased balance, decreased endurance, decreased mobility, difficulty walking, decreased strength, impaired perceived functional ability, impaired flexibility, improper body mechanics, postural dysfunction, and pain.    ACTIVITY LIMITATIONS carrying, lifting, bending, standing, squatting, stairs, transfers, bed mobility, reach over head, and locomotion level   PARTICIPATION LIMITATIONS: meal prep, cleaning, interpersonal relationship, driving, shopping, and community activity   PERSONAL FACTORS   History of 10/19/21 C3-4 C4-5 C5-6 ACDF with soft collar PMH bracial neuritis, abnormal gait, Degeneration of lumbar, HTN, gouty arthropathy, OA, lumbar laminectomy 2011, Rt TKA  are also affecting patient's functional outcome.    REHAB POTENTIAL: Good   CLINICAL DECISION MAKING: Stable/uncomplicated   EVALUATION COMPLEXITY: Low     GOALS: Goals reviewed with patient? Yes   Short term PT Goals (target date for Short term goals are 4 weeks 06/23/22) Patient will demonstrate independent use of home exercise program to maintain progress from in clinic treatments. Goal status: MET   Long term PT goals (target dates for all long term goals are 4 weeks  09/23/2022 )   1. Patient will demonstrate/report pain at worst less than or equal to 2/10 to facilitate minimal limitation in daily activity secondary to pain  symptoms. Goal status: MET 04/05/22   2. Patient will demonstrate independent use of home exercise program to facilitate ability to maintain/progress functional gains from skilled physical therapy services. Goal status: MET 05/24/22   3. Patient will demonstrate bilateral hip MMT 5/5 throughout to facilitate improved transfers, ambulation towards independence.  Goal status: revised 08/26/2022   4.  Patient will demonstrate BERG testing > or = 40 to indicate reduced fall risk.  Goal status:  revised 08/26/2022   5.  Patient will demonstrate TUG c LRAD < or = 20 seconds to indicate reduced fall risk/ improved community ambulation.    Goal status:  revised 08/26/2022   6.  Patient will demonstrate ability to ambulation c LRAD (SPC, independent) community distances > 300 ft s rest  Goal status: revised 08/26/2022     PLAN: PT FREQUENCY: 1-2x/week   PT DURATION: 4 more  weeks from recert   PLANNED INTERVENTIONS: Therapeutic exercises, Therapeutic activity, Neuro Muscular re-education, Balance training, Gait training, Patient/Family education, Joint mobilization, Stair training, DME instructions, Dry Needling, Electrical stimulation, Cryotherapy, Moist heat, Taping, Ultrasound, Ionotophoresis '4mg'$ /ml Dexamethasone, and Manual therapy.  All included unless contraindicated   PLAN FOR NEXT SESSION:  Begin review of HEP , specifically balance exercises that would be safe at home.    Scot Jun, PT, DPT, OCS, ATC 08/26/22  2:09 PM

## 2022-08-30 ENCOUNTER — Encounter: Payer: Self-pay | Admitting: Rehabilitative and Restorative Service Providers"

## 2022-08-30 ENCOUNTER — Ambulatory Visit (INDEPENDENT_AMBULATORY_CARE_PROVIDER_SITE_OTHER): Payer: HMO | Admitting: Rehabilitative and Restorative Service Providers"

## 2022-08-30 DIAGNOSIS — R293 Abnormal posture: Secondary | ICD-10-CM | POA: Diagnosis not present

## 2022-08-30 DIAGNOSIS — M545 Low back pain, unspecified: Secondary | ICD-10-CM | POA: Diagnosis not present

## 2022-08-30 DIAGNOSIS — R2689 Other abnormalities of gait and mobility: Secondary | ICD-10-CM | POA: Diagnosis not present

## 2022-08-30 DIAGNOSIS — G8929 Other chronic pain: Secondary | ICD-10-CM

## 2022-08-30 DIAGNOSIS — M6281 Muscle weakness (generalized): Secondary | ICD-10-CM | POA: Diagnosis not present

## 2022-08-30 DIAGNOSIS — M542 Cervicalgia: Secondary | ICD-10-CM

## 2022-08-30 NOTE — Therapy (Signed)
OUTPATIENT PHYSICAL THERAPY TREATMENT NOTE    Patient Name: MAHOGONY HARRIER MRN: [id number] DOB:Dec 01, 1933, 89 y.o., female Today's Date: 08/30/2022   END OF SESSION:   PT End of Session - 08/30/22 1303     Visit Number 44    Number of Visits 50    Date for PT Re-Evaluation 09/23/22    Authorization Type [employer] $15 copay    Progress Note Due on Visit 69    PT Start Time 1258    PT Stop Time 1337    PT Time Calculation (min) 39 min    Equipment Utilized During Treatment Gait belt    Activity Tolerance Patient tolerated treatment well    Behavior During Therapy WFL for tasks assessed/performed               Past Medical History:  Diagnosis Date   Abnormality of gait    Brachial neuritis or radiculitis NOS    Degeneration of lumbar or lumbosacral intervertebral disc    Dysrhythmia    Essential hypertension, benign    Gouty arthropathy    Lumbago    Obesity    Osteoarthritis    Pure hypercholesterolemia    Unspecified hypothyroidism    Past Surgical History:  Procedure Laterality Date   ABDOMINAL HYSTERECTOMY  03/13/2010   ANTERIOR CERVICAL DECOMP/DISCECTOMY FUSION N/A 10/19/2021   Procedure: C3-4, C4-5, C5-6 ANTERIOR CERVICAL DISCECTOMY FUSION, ALLOGRAFT, PLATE;  Surgeon: [name], MD;  Location: [facility];  Service: Orthopedics;  Laterality: N/A;   BACK SURGERY     LUMBAR LAMINECTOMY  03/13/2010   right knee replacement  03/13/2010   Patient Active Problem List   Diagnosis Date Noted   Vitamin D deficiency disease 06/21/2022   Encounter for general adult medical examination with abnormal findings 06/21/2022   Vitamin B12 deficiency neuropathy ([place]) 02/15/2022   Hypercalcemia 02/15/2022   Cervical stricture or stenosis 10/20/2021   Cervical spinal stenosis 10/19/2021   Meningioma ([place]) 08/24/2021   PAF (paroxysmal atrial fibrillation) ([place]) 11/03/2020   Stage 3b chronic kidney disease ([place]) 05/06/2020   Spinal stenosis in cervical region 05/12/2016    Therapeutic opioid-induced constipation ([facility]) 01/01/2016   Chronic idiopathic constipation 09/11/2013   Lumbosacral spondylosis without myelopathy 04/09/2013   Postlaminectomy syndrome, lumbar region 04/09/2013   Anticoagulation management encounter 02/01/2012   Neuropathy, peripheral 08/04/2011   Pure hypercholesterolemia 08/04/2011   Essential hypertension, benign 08/04/2011   Hypothyroidism 08/04/2011   DJD (degenerative joint disease) of knee 08/04/2011   Gout 08/04/2011      THERAPY DIAG:  Cervicalgia  Other abnormalities of gait and mobility  Muscle weakness (generalized)  Abnormal posture  Chronic bilateral low back pain without sciatica  PCP: Janith Lima. MD   REFERRING PROVIDER: [name], MD   REFERRING DIAG: Z98.1 (ICD-10-CM) - S/P cervical spinal fusion    Rationale for Evaluation and Treatment Rehabilitation   ONSET DATE: Dec 2022   SUBJECTIVE:  SUBJECTIVE STATEMENT: She reported she was trying to walk more.  She reported getting up and moving around every 30 mins or so doing things around the house.  No specific complaint change since last visit.      PERTINENT HISTORY:  History of 10/19/21 C3-4 C4-5 C5-6 ACDF with soft collar PMH bracial neuritis, abnormal gait, Degeneration of lumbar, HTN, gouty arthropathy, OA, lumbar laminectomy 2011, Rt TKA   PAIN:  No pain upon arrival.    PRECAUTIONS: None   WEIGHT BEARING RESTRICTIONS No   FALLS:  Has patient fallen in last 6 months? Maybe 1 fall in last 6 months.  Several falls within last year.    LIVING ENVIRONMENT: Lives with:lives alone Lives in: House/apartment Stairs: 3 steps to enter deck, rail on Lt going up.  No stairs in house.  Has following equipment at home: FWW, cane   OCCUPATION:  retired    PLOF: Independent  , cooking/cleaning, shopping/grocery store   [place] stronger, walking independently.     OBJECTIVE:    PATIENT SURVEYS:  01/25/2022 No foto - incorrect set up   COGNITION: 01/25/2022 Overall cognitive status: Within functional limits for tasks assessed    SENSATION: 01/25/2022 not tested today   POSTURE:  01/25/2022 rounded shoulders, forward head, increased thoracic kyphosis, and flexed trunk    PALPATION: 01/25/2022 no specific tenderness noted in screening for balance.      MMT: 01/25/2022:  Seated resisted static testing hip abduction Rt: strong/painless. Lt moderate strength, painless (held MMT due to difficulty in bed mobility positioning MMT Right 01/25/2022 Left 01/25/2022 Left 02/22/2022 Left 03/11/2022 Left 03/16/22 Left 04/01/2022 Left 04/05/22 Left/Right 05/24/22 Left/Right 07/21/22 Right/ Left 08/26/2022  Shoulder flexion              Shoulder extension              Shoulder abduction              Shoulder adduction              Shoulder extension              Shoulder internal rotation              Shoulder external rotation                                            Hip flexion 5/5 4/5 4/5 4/5 4/5 4+/5 4+ 4+/4+ 4+/4+ 5  /  4+  Hip extension              Hip abduction              Knee flexion 5/5 5/5        5   /   5  Knee extension  5/5 5/5    4+/5 4+ 4+/4+ 4+/4+ 5   / 4+  Ankle DF 5/5 4/5  4/5 4+/5    5/5 5  /  4+   (Blank rows = not tested)   CERVICAL SPECIAL TESTS:  01/25/2022 none performed   FUNCTIONAL TESTS:  08/26/2022:   TUG with FWW: 33.1 seconds   TUG with rollator: 30.4 seconds   06/03/2022:  TUG c FWW:  34.95 seconds, 32.84 seconds 05/24/22: TUG c FWW:  36 seconds 05/06/2022: TUG c FWW:  37 seconds at end of session 04/05/22 TUG 38.5s seconds  with FWW 03/22/22: TUG 44 seconds C FWW (done at very end of session) 03/08/2022:   TUG:  41.76 seconds c FWW 01/25/2022        TUG:   71 seconds c FWW      08/26/22 0001  Berg Balance Test  Sit to Stand 3  Standing Unsupported 3  Sitting with Back Unsupported but Feet Supported on Floor or Stool 4  Stand to Sit 3  Transfers 3  Standing Unsupported with Eyes Closed 3  Standing Unsupported with Feet Together 3  From Standing, Reach Forward with Outstretched Arm 1  From Standing Position, Pick up Object from Floor 3  From Standing Position, Turn to Look Behind Over each Shoulder 2  Turn 360 Degrees 1  Standing Unsupported, Alternately Place Feet on Step/Stool 0  Standing Unsupported, One Foot in Front 2  Standing on One Leg 0  Total Score 31       06/24/22 0001  Berg Balance Test  Sit to Stand 3  Standing Unsupported 3  Sitting with Back Unsupported but Feet Supported on Floor or Stool 4  Stand to Sit 3  Transfers 3  Standing Unsupported with Eyes Closed 3  Standing Unsupported with Feet Together 3  From Standing, Reach Forward with Outstretched Arm 3  From Standing Position, Pick up Object from Floor 3  From Standing Position, Turn to Look Behind Over each Shoulder 2  Turn 360 Degrees 1  Standing Unsupported, Alternately Place Feet on Step/Stool 0  Standing Unsupported, One Foot in Front 2  Standing on One Leg 0  Total Score 33  (previously 21 on 01/25/2022    01/25/2022 BERG 21    GAIT ASSESSMENT: 08/26/2022:  Mod independent c rollator : 250 ft prior to fatigue.   2 min walk test 125 ft c rollator  08/19/2022:  Mod independent c FWW and rollator household distances in clinic.   07/09/2022:  Ambulation mod independent c FWW with marked reduced gait speed, limited in distance due to fatigue (household distances).  Able to perform ambulation c SPC c CGA to occasional SBA within clinic distances < 100 ft.   03/18/2022:  FWW into clinic c mod independent, decreased gait speed.  SPC use in Rt UE c CGA 33 ft today c cues for sequencing.    TODAY'S TREATMENT:  08/30/2022 Therex: Recumbent bike 10 mins lvl 1, seat 7 Seated green  band around knees clamshell with isometric hold opposite leg x 20 bilateral Seated marching green band around knees x 15 bilateral Seated LAQ 2-3 sec hold end range x 15 bilateral Sit to stand to sit with focus on slow lowering and reduced hand assist as possible x 5  18 inch chair c airex pad in it   Spent time c cues verbal, visual on HEP review and handout provided.   Neuro Re-ed Corner standing:  Alt heel/toe raises x 20 with bilateral hand assist on bars (cues for use Rt hand only at times) - cues for home use  Modified tandem stance 30 sec x 2 bilateral - cues for home use   08/26/2022: Therex: Recumbent bike Lvl 1 10 mins seat 8 Sit to stand to sit x 10 18 inch chair c hand assist bilateral as necessary    Physical Performance Testing: Extended time for education, cues and performance of testing listed above including BERG balance testing, TUG with rollator and with FWW, 2 min walk test/distance walk testing.     08/23/2022 Therex: Recumbent bike lvl  3-1 x10 min, seat 8  Leg press double leg 81# 2x15, single leg 31# x20 bilateral Leg extension machine Double leg up, single leg lowering 3 x 10 5 lbs  Leg curl machine double leg 20 lbs 3x10  TherActivity:   -Rollator ramp up/down with CGA  -Rollator up/down 4 inch step c CGA at times c cues for rollator positioning   - Rollator up ramp and down 2 steps with CGA  08/19/2022 Therex: Recumbent bike lvl 1-2 x10 min, seat 8  Leg press double leg 81# 2x15, single leg 31# x20 bilateral Leg extension machine Double leg up, single leg lowering 2 x 10 5 lbs  Leg curl machine double leg 20 lbs x 15  TherActivity:   Rollator ramp up/down and curb 4 inch step c CGA at times c cues for rollator positioning  - 1 ramp and 2 steps each way.  Additional time required for education and performance.     PATIENT EDUCATION:  08/30/2022 Education details: HEP update Person educated: Patient Education method: Consulting civil engineer, Demonstration,  Verbal cues, and Handouts Education comprehension: verbalized understanding, returned demonstration, and verbal cues required     HOME EXERCISE PROGRAM: Access Code: [email or web address] URL: [email or web address] Date: 08/30/2022 Prepared by: Scot Jun  Exercises - Seated March  - 1 x daily - 7 x weekly - 1-2 sets - 10 reps - Sit to Stand  - 1 x daily - 7 x weekly - 1-2 sets - 5-10 reps - Seated Long Arc Quad  - 1 x daily - 7 x weekly - 1-2 sets - 10 reps - 2 hold - Heel Toe Raises with Counter Support  - 1 x daily - 7 x weekly - 1-2 sets - 10 reps - Standing Tandem Balance with Counter Support  - 1 x daily - 7 x weekly - 1 sets - 3-5 reps - 20-30 hold - Seated Quad Set (Mirrored)  - 1-2 x daily - 7 x weekly - 1 sets - 10 reps - 5 hold - Seated Straight Leg Heel Taps  - 1-2 x daily - 7 x weekly - 1-2 sets - 5-10 reps - Seated Hip Abduction with Resistance  - 1-2 x daily - 7 x weekly - 3 sets - 10 reps   ASSESSMENT:   CLINICAL IMPRESSION:  Spent time today to review HEP to ensure good knowledge and techniques c focus on strengthening and static balance in safe manner.  Continued skilled PT services warranted to address Lt leg strength deficits and balance deficits to reduce fall risk.   OBJECTIVE IMPAIRMENTS Abnormal gait, decreased activity tolerance, decreased balance, decreased endurance, decreased mobility, difficulty walking, decreased strength, impaired perceived functional ability, impaired flexibility, improper body mechanics, postural dysfunction, and pain.    ACTIVITY LIMITATIONS carrying, lifting, bending, standing, squatting, stairs, transfers, bed mobility, reach over head, and locomotion level   PARTICIPATION LIMITATIONS: meal prep, cleaning, interpersonal relationship, driving, shopping, and community activity   PERSONAL FACTORS   History of 10/19/21 C3-4 C4-5 C5-6 ACDF with soft collar PMH bracial neuritis, abnormal gait, Degeneration of lumbar, HTN, gouty  arthropathy, OA, lumbar laminectomy 2011, Rt TKA  are also affecting patient's functional outcome.    REHAB POTENTIAL: Good   CLINICAL DECISION MAKING: Stable/uncomplicated   EVALUATION COMPLEXITY: Low     GOALS: Goals reviewed with patient? Yes   Short term PT Goals (target date for Short term goals are 4 weeks 06/23/22) Patient will demonstrate independent use of home exercise program to maintain progress from in  clinic treatments. Goal status: MET   Long term PT goals (target dates for all long term goals are 4 weeks  09/23/2022 )   1. Patient will demonstrate/report pain at worst less than or equal to 2/10 to facilitate minimal limitation in daily activity secondary to pain symptoms. Goal status: MET 04/05/22   2. Patient will demonstrate independent use of home exercise program to facilitate ability to maintain/progress functional gains from skilled physical therapy services. Goal status: MET 05/24/22   3. Patient will demonstrate bilateral hip MMT 5/5 throughout to facilitate improved transfers, ambulation towards independence.  Goal status: revised 08/26/2022   4.  Patient will demonstrate BERG testing > or = 40 to indicate reduced fall risk.  Goal status:  revised 08/26/2022   5.  Patient will demonstrate TUG c LRAD < or = 20 seconds to indicate reduced fall risk/ improved community ambulation.    Goal status:  revised 08/26/2022   6.  Patient will demonstrate ability to ambulation c LRAD (SPC, independent) community distances > 300 ft s rest  Goal status: revised 08/26/2022     PLAN: PT FREQUENCY: 1-2x/week   PT DURATION: 4 more  weeks from recert   PLANNED INTERVENTIONS: Therapeutic exercises, Therapeutic activity, Neuro Muscular re-education, Balance training, Gait training, Patient/Family education, Joint mobilization, Stair training, DME instructions, Dry Needling, Electrical stimulation, Cryotherapy, Moist heat, Taping, Ultrasound, Ionotophoresis '4mg'$ /ml  Dexamethasone, and Manual therapy.  All included unless contraindicated   PLAN FOR NEXT SESSION:  Resume in clinic strengthening (leg press, etc) as well as balance challenges to improve ambulation.    Scot Jun, PT, DPT, OCS, ATC 08/30/22  1:34 PM

## 2022-09-07 ENCOUNTER — Encounter: Payer: Self-pay | Admitting: Rehabilitative and Restorative Service Providers"

## 2022-09-07 ENCOUNTER — Ambulatory Visit (INDEPENDENT_AMBULATORY_CARE_PROVIDER_SITE_OTHER): Payer: HMO | Admitting: Rehabilitative and Restorative Service Providers"

## 2022-09-07 DIAGNOSIS — M6281 Muscle weakness (generalized): Secondary | ICD-10-CM

## 2022-09-07 DIAGNOSIS — M542 Cervicalgia: Secondary | ICD-10-CM

## 2022-09-07 DIAGNOSIS — R2689 Other abnormalities of gait and mobility: Secondary | ICD-10-CM | POA: Diagnosis not present

## 2022-09-07 DIAGNOSIS — R293 Abnormal posture: Secondary | ICD-10-CM

## 2022-09-07 DIAGNOSIS — G8929 Other chronic pain: Secondary | ICD-10-CM

## 2022-09-07 DIAGNOSIS — M545 Low back pain, unspecified: Secondary | ICD-10-CM | POA: Diagnosis not present

## 2022-09-07 NOTE — Therapy (Signed)
OUTPATIENT PHYSICAL THERAPY TREATMENT NOTE    Patient Name: Claudia [name] MRN: [id number] DOB:[birth year], 89 y.o., female Today's Date: 09/07/2022   END OF SESSION:   PT End of Session - 09/07/22 1350     Visit Number 45    Number of Visits 50    Date for PT Re-Evaluation 09/23/22    Authorization Type [employer] $15 copay    Progress Note Due on Visit 68    PT Start Time 1343    PT Stop Time 1422    PT Time Calculation (min) 39 min    Equipment Utilized During Treatment Gait belt    Activity Tolerance Patient tolerated treatment well    Behavior During Therapy WFL for tasks assessed/performed                Past Medical History:  Diagnosis Date   Abnormality of gait    Brachial neuritis or radiculitis NOS    Degeneration of lumbar or lumbosacral intervertebral disc    Dysrhythmia    Essential hypertension, benign    Gouty arthropathy    Lumbago    Obesity    Osteoarthritis    Pure hypercholesterolemia    Unspecified hypothyroidism    Past Surgical History:  Procedure Laterality Date   ABDOMINAL HYSTERECTOMY  03/13/2010   ANTERIOR CERVICAL DECOMP/DISCECTOMY FUSION N/A 10/19/2021   Procedure: C3-4, C4-5, C5-6 ANTERIOR CERVICAL DISCECTOMY FUSION, ALLOGRAFT, PLATE;  Surgeon: [name], MD;  Location: [facility];  Service: Orthopedics;  Laterality: N/A;   BACK SURGERY     LUMBAR LAMINECTOMY  03/13/2010   right knee replacement  03/13/2010   Patient Active Problem List   Diagnosis Date Noted   Vitamin D deficiency disease 06/21/2022   Encounter for general adult medical examination with abnormal findings 06/21/2022   Vitamin B12 deficiency neuropathy ([place]) 02/15/2022   Hypercalcemia 02/15/2022   Cervical stricture or stenosis 10/20/2021   Cervical spinal stenosis 10/19/2021   Meningioma ([place]) 08/24/2021   PAF (paroxysmal atrial fibrillation) ([place]) 11/03/2020   Stage 3b chronic kidney disease ([place]) 05/06/2020   Spinal stenosis in cervical region 05/12/2016    Therapeutic opioid-induced constipation ([facility]) 01/01/2016   Chronic idiopathic constipation 09/11/2013   Lumbosacral spondylosis without myelopathy 04/09/2013   Postlaminectomy syndrome, lumbar region 04/09/2013   Anticoagulation management encounter 02/01/2012   Neuropathy, peripheral 08/04/2011   Pure hypercholesterolemia 08/04/2011   Essential hypertension, benign 08/04/2011   Hypothyroidism 08/04/2011   DJD (degenerative joint disease) of knee 08/04/2011   Gout 08/04/2011      THERAPY DIAG:  Cervicalgia  Other abnormalities of gait and mobility  Muscle weakness (generalized)  Abnormal posture  Chronic bilateral low back pain without sciatica  PCP: Janith Lima. MD   REFERRING PROVIDER: [name], MD   REFERRING DIAG: Z98.1 (ICD-10-CM) - S/P cervical spinal fusion    Rationale for Evaluation and Treatment Rehabilitation   ONSET DATE: Dec 2022   SUBJECTIVE:  SUBJECTIVE STATEMENT: She reported she was trying to walk more.  She reported getting up and moving around every 30 mins or so doing things around the house.  No specific complaint change since last visit.      PERTINENT HISTORY:  History of 10/19/21 C3-4 C4-5 C5-6 ACDF with soft collar PMH bracial neuritis, abnormal gait, Degeneration of lumbar, HTN, gouty arthropathy, OA, lumbar laminectomy 2011, Rt TKA   PAIN:  No pain upon arrival.    PRECAUTIONS: None   WEIGHT BEARING RESTRICTIONS No   FALLS:  Has patient fallen in last 6 months? Maybe 1 fall in last 6 months.  Several falls within last year.    LIVING ENVIRONMENT: Lives with:lives alone Lives in: House/apartment Stairs: 3 steps to enter deck, rail on Lt going up.  No stairs in house.  Has following equipment at home: FWW, cane    OCCUPATION: retired    PLOF: Independent  , cooking/cleaning, shopping/grocery store   [place] stronger, walking independently.     OBJECTIVE:    PATIENT SURVEYS:  01/25/2022 No foto - incorrect set up   COGNITION: 01/25/2022 Overall cognitive status: Within functional limits for tasks assessed    SENSATION: 01/25/2022 not tested today   POSTURE:  01/25/2022 rounded shoulders, forward head, increased thoracic kyphosis, and flexed trunk    PALPATION: 01/25/2022 no specific tenderness noted in screening for balance.      MMT: 01/25/2022:  Seated resisted static testing hip abduction Rt: strong/painless. Lt moderate strength, painless (held MMT due to difficulty in bed mobility positioning MMT Right 01/25/2022 Left 01/25/2022 Left 02/22/2022 Left 03/11/2022 Left 03/16/22 Left 04/01/2022 Left 04/05/22 Left/Right 05/24/22 Left/Right 07/21/22 Right/ Left 08/26/2022  Shoulder flexion              Shoulder extension              Shoulder abduction              Shoulder adduction              Shoulder extension              Shoulder internal rotation              Shoulder external rotation                                            Hip flexion 5/5 4/5 4/5 4/5 4/5 4+/5 4+ 4+/4+ 4+/4+ 5  /  4+  Hip extension              Hip abduction              Knee flexion 5/5 5/5        5   /   5  Knee extension  5/5 5/5    4+/5 4+ 4+/4+ 4+/4+ 5   / 4+  Ankle DF 5/5 4/5  4/5 4+/5    5/5 5  /  4+   (Blank rows = not tested)   CERVICAL SPECIAL TESTS:  01/25/2022 none performed   FUNCTIONAL TESTS:  3/12/024:    TUG with rollator 30.6 seconds  08/26/2022:   TUG with FWW: 33.1 seconds   TUG with rollator: 30.4 seconds   06/03/2022:  TUG c FWW:  34.95 seconds, 32.84 seconds 05/24/22: TUG c FWW:  36 seconds 05/06/2022: TUG c FWW:  37 seconds at end of session 04/05/22 TUG 38.5s seconds with FWW 03/22/22: TUG 44 seconds C FWW (done at very end of session) 03/08/2022:   TUG:  41.76  seconds c FWW 01/25/2022        TUG:   71 seconds c FWW     08/26/22 0001  Berg Balance Test  Sit to Stand 3  Standing Unsupported 3  Sitting with Back Unsupported but Feet Supported on Floor or Stool 4  Stand to Sit 3  Transfers 3  Standing Unsupported with Eyes Closed 3  Standing Unsupported with Feet Together 3  From Standing, Reach Forward with Outstretched Arm 1  From Standing Position, Pick up Object from Floor 3  From Standing Position, Turn to Look Behind Over each Shoulder 2  Turn 360 Degrees 1  Standing Unsupported, Alternately Place Feet on Step/Stool 0  Standing Unsupported, One Foot in Front 2  Standing on One Leg 0  Total Score 31       06/24/22 0001  Berg Balance Test  Sit to Stand 3  Standing Unsupported 3  Sitting with Back Unsupported but Feet Supported on Floor or Stool 4  Stand to Sit 3  Transfers 3  Standing Unsupported with Eyes Closed 3  Standing Unsupported with Feet Together 3  From Standing, Reach Forward with Outstretched Arm 3  From Standing Position, Pick up Object from Floor 3  From Standing Position, Turn to Look Behind Over each Shoulder 2  Turn 360 Degrees 1  Standing Unsupported, Alternately Place Feet on Step/Stool 0  Standing Unsupported, One Foot in Front 2  Standing on One Leg 0  Total Score 33  (previously 21 on 01/25/2022    01/25/2022 BERG 21    GAIT ASSESSMENT: 08/26/2022:  Mod independent c rollator : 250 ft prior to fatigue.   2 min walk test 125 ft c rollator  08/19/2022:  Mod independent c FWW and rollator household distances in clinic.   07/09/2022:  Ambulation mod independent c FWW with marked reduced gait speed, limited in distance due to fatigue (household distances).  Able to perform ambulation c SPC c CGA to occasional SBA within clinic distances < 100 ft.   03/18/2022:  FWW into clinic c mod independent, decreased gait speed.  SPC use in Rt UE c CGA 33 ft today c cues for sequencing.    TODAY'S TREATMENT:   09/07/2022 Therex: Recumbent bike 12 mins lvl 2, seat 7 Forward step up in // bars with bilateral hand rail assist and CGA x 10 bilateral 4 inch step 6 inch hurdle step over and back in // bars with bilateral hand rail assist c CGA x 10 bilateral Leg press double leg 81 lbs x 20, single leg 31 lbs 2 x 15 bilateral  TUG x 1 c rollator    08/30/2022 Therex: Recumbent bike 10 mins lvl 1, seat 7 Seated green band around knees clamshell with isometric hold opposite leg x 20 bilateral Seated marching green band around knees x 15 bilateral Seated LAQ 2-3 sec hold end range x 15 bilateral Sit to stand to sit with focus on slow lowering and reduced hand assist as possible x 5  18 inch chair c airex pad in it   Spent time c cues verbal, visual on HEP review and handout provided.   Neuro Re-ed Corner standing:  Alt heel/toe raises x 20 with bilateral hand assist on bars (cues for use Rt hand only at times) - cues for home use  Modified tandem stance 30 sec x 2 bilateral - cues for home use   08/26/2022: Therex: Recumbent bike Lvl 1 10 mins seat 8 Sit to stand to sit x 10 18 inch chair c hand assist bilateral as necessary  Physical Performance Testing: Extended time for education, cues and performance of testing listed above including BERG balance testing, TUG with rollator and with FWW, 2 min walk test/distance walk testing.     08/23/2022 Therex: Recumbent bike lvl 3-1 x10 min, seat 8  Leg press double leg 81# 2x15, single leg 31# x20 bilateral Leg extension machine Double leg up, single leg lowering 3 x 10 5 lbs  Leg curl machine double leg 20 lbs 3x10  TherActivity:   -Rollator ramp up/down with CGA  -Rollator up/down 4 inch step c CGA at times c cues for rollator positioning   - Rollator up ramp and down 2 steps with CGA  08/19/2022 Therex: Recumbent bike lvl 1-2 x10 min, seat 8  Leg press double leg 81# 2x15, single leg 31# x20 bilateral Leg extension machine Double leg up,  single leg lowering 2 x 10 5 lbs  Leg curl machine double leg 20 lbs x 15  TherActivity:   Rollator ramp up/down and curb 4 inch step c CGA at times c cues for rollator positioning  - 1 ramp and 2 steps each way.  Additional time required for education and performance.     PATIENT EDUCATION:  08/30/2022 Education details: HEP update Person educated: Patient Education method: Consulting civil engineer, Demonstration, Verbal cues, and Handouts Education comprehension: verbalized understanding, returned demonstration, and verbal cues required     HOME EXERCISE PROGRAM: Access Code: [email or web address] URL: [email or web address] Date: 08/30/2022 Prepared by: Scot Jun  Exercises - Seated March  - 1 x daily - 7 x weekly - 1-2 sets - 10 reps - Sit to Stand  - 1 x daily - 7 x weekly - 1-2 sets - 5-10 reps - Seated Long Arc Quad  - 1 x daily - 7 x weekly - 1-2 sets - 10 reps - 2 hold - Heel Toe Raises with Counter Support  - 1 x daily - 7 x weekly - 1-2 sets - 10 reps - Standing Tandem Balance with Counter Support  - 1 x daily - 7 x weekly - 1 sets - 3-5 reps - 20-30 hold - Seated Quad Set (Mirrored)  - 1-2 x daily - 7 x weekly - 1 sets - 10 reps - 5 hold - Seated Straight Leg Heel Taps  - 1-2 x daily - 7 x weekly - 1-2 sets - 5-10 reps - Seated Hip Abduction with Resistance  - 1-2 x daily - 7 x weekly - 3 sets - 10 reps   ASSESSMENT:   CLINICAL IMPRESSION:  Pt demonstrated continued difficulty in dynamic balance /walking control with reduced hand assist on bars with noted deficits in Lt hip flexion/extension control /strength.  Presence of these impairments creates medical necessity for continued treatment.   OBJECTIVE IMPAIRMENTS Abnormal gait, decreased activity tolerance, decreased balance, decreased endurance, decreased mobility, difficulty walking, decreased strength, impaired perceived functional ability, impaired flexibility, improper body mechanics, postural dysfunction, and pain.     ACTIVITY LIMITATIONS carrying, lifting, bending, standing, squatting, stairs, transfers, bed mobility, reach over head, and locomotion level   PARTICIPATION LIMITATIONS: meal prep, cleaning, interpersonal relationship, driving, shopping, and community activity   PERSONAL FACTORS   History of 10/19/21 C3-4 C4-5 C5-6 ACDF with soft collar PMH bracial neuritis, abnormal gait, Degeneration  of lumbar, HTN, gouty arthropathy, OA, lumbar laminectomy 2011, Rt TKA  are also affecting patient's functional outcome.    REHAB POTENTIAL: Good   CLINICAL DECISION MAKING: Stable/uncomplicated   EVALUATION COMPLEXITY: Low     GOALS: Goals reviewed with patient? Yes   Short term PT Goals (target date for Short term goals are 4 weeks 06/23/22) Patient will demonstrate independent use of home exercise program to maintain progress from in clinic treatments. Goal status: MET   Long term PT goals (target dates for all long term goals are 4 weeks  09/23/2022 )   1. Patient will demonstrate/report pain at worst less than or equal to 2/10 to facilitate minimal limitation in daily activity secondary to pain symptoms. Goal status: MET 04/05/22   2. Patient will demonstrate independent use of home exercise program to facilitate ability to maintain/progress functional gains from skilled physical therapy services. Goal status: MET 05/24/22   3. Patient will demonstrate bilateral hip MMT 5/5 throughout to facilitate improved transfers, ambulation towards independence.  Goal status: revised 08/26/2022   4.  Patient will demonstrate BERG testing > or = 40 to indicate reduced fall risk.  Goal status:  revised 08/26/2022   5.  Patient will demonstrate TUG c LRAD < or = 20 seconds to indicate reduced fall risk/ improved community ambulation.    Goal status:  revised 08/26/2022   6.  Patient will demonstrate ability to ambulation c LRAD (SPC, independent) community distances > 300 ft s rest  Goal status: revised  08/26/2022     PLAN: PT FREQUENCY: 1-2x/week   PT DURATION: 4 more  weeks from recert   PLANNED INTERVENTIONS: Therapeutic exercises, Therapeutic activity, Neuro Muscular re-education, Balance training, Gait training, Patient/Family education, Joint mobilization, Stair training, DME instructions, Dry Needling, Electrical stimulation, Cryotherapy, Moist heat, Taping, Ultrasound, Ionotophoresis '4mg'$ /ml Dexamethasone, and Manual therapy.  All included unless contraindicated   PLAN FOR NEXT SESSION:  Resume in clinic strengthening (leg press, etc) as well as balance challenges to improve ambulation.    Scot Jun, PT, DPT, OCS, ATC 09/07/22  2:22 PM

## 2022-09-09 ENCOUNTER — Encounter: Payer: HMO | Admitting: Rehabilitative and Restorative Service Providers"

## 2022-09-10 ENCOUNTER — Other Ambulatory Visit: Payer: Self-pay | Admitting: *Deleted

## 2022-09-10 NOTE — Telephone Encounter (Signed)
Called the pharmacy since received a xarelto 20mg  daily refill from [facility] and the pt should be on Xarelto 15mg  daily. Spoke with Loma Sousa and she stated the 20mg  refill was in error and then pt has been picking up 15mg  xarelto. She states she will d/c xarelto 20mg  qd off the medlist

## 2022-09-13 ENCOUNTER — Telehealth: Payer: Self-pay | Admitting: Internal Medicine

## 2022-09-13 ENCOUNTER — Encounter (HOSPITAL_BASED_OUTPATIENT_CLINIC_OR_DEPARTMENT_OTHER): Payer: Self-pay | Admitting: Family

## 2022-09-13 ENCOUNTER — Ambulatory Visit: Payer: HMO

## 2022-09-13 ENCOUNTER — Other Ambulatory Visit: Payer: Self-pay | Admitting: Internal Medicine

## 2022-09-13 ENCOUNTER — Ambulatory Visit (INDEPENDENT_AMBULATORY_CARE_PROVIDER_SITE_OTHER): Payer: HMO | Admitting: Family

## 2022-09-13 VITALS — BP 132/64 | HR 63 | Ht 70.0 in | Wt 158.0 lb

## 2022-09-13 DIAGNOSIS — M170 Bilateral primary osteoarthritis of knee: Secondary | ICD-10-CM

## 2022-09-13 DIAGNOSIS — M47817 Spondylosis without myelopathy or radiculopathy, lumbosacral region: Secondary | ICD-10-CM

## 2022-09-13 DIAGNOSIS — I10 Essential (primary) hypertension: Secondary | ICD-10-CM | POA: Diagnosis not present

## 2022-09-13 DIAGNOSIS — D6859 Other primary thrombophilia: Secondary | ICD-10-CM | POA: Diagnosis not present

## 2022-09-13 DIAGNOSIS — I480 Paroxysmal atrial fibrillation: Secondary | ICD-10-CM

## 2022-09-13 DIAGNOSIS — M961 Postlaminectomy syndrome, not elsewhere classified: Secondary | ICD-10-CM

## 2022-09-13 DIAGNOSIS — K5904 Chronic idiopathic constipation: Secondary | ICD-10-CM

## 2022-09-13 MED ORDER — HYDROCODONE-ACETAMINOPHEN 5-325 MG PO TABS: 1.0000 | ORAL_TABLET | Freq: Four times a day (QID) | ORAL | 0 refills | Status: DC | PRN

## 2022-09-13 MED ORDER — LINACLOTIDE 290 MCG PO CAPS: 290.0000 ug | ORAL_CAPSULE | Freq: Every day | ORAL | 1 refills | Status: DC

## 2022-09-13 NOTE — Patient Instructions (Signed)
Medication Instructions:  Continue your current medications.  *If you need a refill on your cardiac medications before your next appointment, please call your pharmacy*   Lab Work/Testing/Procedures: Your EKG today looked good!   Follow-Up: At [facility], you and your health needs are our priority.  As part of our continuing mission to provide you with exceptional heart care, we have created designated Provider Care Teams.  These Care Teams include your primary Cardiologist (physician) and Advanced Practice Providers (APPs -  Physician Assistants and Nurse Practitioners) who all work together to provide you with the care you need, when you need it.  We recommend signing up for the patient portal called "MyChart".  Sign up information is provided on this After Visit Summary.  MyChart is used to connect with patients for Virtual Visits (Telemedicine).  Patients are able to view lab/test results, encounter notes, upcoming appointments, etc.  Non-urgent messages can be sent to your provider as well.   To learn more about what you can do with MyChart, go to [email or web address].    Your next appointment:   1 year(s)  Provider:   Buford Dresser, MD or Laurann Montana, NP    Other Instructions  Heart Healthy Diet Recommendations: A low-salt diet is recommended. Meats should be grilled, baked, or boiled. Avoid fried foods. Focus on lean protein sources like fish or chicken with vegetables and fruits. The American Heart Association is a Microbiologist!  American Heart Association Diet and Lifeystyle Recommendations   Exercise recommendations: The American Heart Association recommends 150 minutes of moderate intensity exercise weekly. Try 30 minutes of moderate intensity exercise 4-5 times per week. This could include walking, jogging, or swimming.

## 2022-09-13 NOTE — Telephone Encounter (Signed)
Prescription Request  09/13/2022  LOV: 06/14/2022  What is the name of the medication or equipment?  HYDROcodone-acetaminophen (NORCO/VICODIN) 5-325 MG tablet  linaclotide (LINZESS) 290 MCG CAPS capsule   Have you contacted your pharmacy to request a refill? No   Which pharmacy would you like this sent to?  [facility] - [place], [name] - [address] [name] [address] Phone: [phone] Fax: [phone]    Patient notified that their request is being sent to the clinical staff for review and that they should receive a response within 2 business days.   Please advise at Mobile [phone] (mobile)

## 2022-09-13 NOTE — Progress Notes (Signed)
Office Visit    Patient Name: Claudia [name] Date of Encounter: 09/13/2022  PCP:  Janith Lima, MD   [place]  Cardiologist:  Skeet Latch, MD  Advanced Practice Provider:  No care team member to display Electrophysiologist:  None      Chief Complaint    Claudia [name] is a 89 y.o. female presents today for annual follow-up of hypertension  Past Medical History    Past Medical History:  Diagnosis Date   Abnormality of gait    Brachial neuritis or radiculitis NOS    Degeneration of lumbar or lumbosacral intervertebral disc    Dysrhythmia    Essential hypertension, benign    Gouty arthropathy    Lumbago    Obesity    Osteoarthritis    Pure hypercholesterolemia    Unspecified hypothyroidism    Past Surgical History:  Procedure Laterality Date   ABDOMINAL HYSTERECTOMY  03/13/2010   ANTERIOR CERVICAL DECOMP/DISCECTOMY FUSION N/A 10/19/2021   Procedure: C3-4, C4-5, C5-6 ANTERIOR CERVICAL DISCECTOMY FUSION, ALLOGRAFT, PLATE;  Surgeon: [name], MD;  Location: [place];  Service: Orthopedics;  Laterality: N/A;   BACK SURGERY     LUMBAR LAMINECTOMY  03/13/2010   right knee replacement  03/13/2010    Allergies  Allergies  Allergen Reactions   Lipitor [Atorvastatin] Other (See Comments)    Muscle aches   Trileptal [Oxcarbazepine] Other (See Comments)    confusion    History of Present Illness    Claudia [name] is a 89 y.o. female with a hx of hypertension, PAF on Eliquis last seen 08/2021 by Dr. Oval Linsey.  Last seen March 2023 for preoperative clearance related to neck surgery and spinal stenosis. Myoview 09/17/21 was low risk. Echo 09/17/21 with normal LVEF, gr1DD, no significant valvular abnormalities.  Proceeded with neck surgery 0000000 without complication.  She did lose her husband 11/03/2021.  She presented for follow-up independently. Her mobility has proved post neck surgery but she still has numbness in her  bilateral hands for which she is still following with physical therapy. Blood pressure checked at home once per week 124/60. Reports no shortness of breath nor dyspnea on exertion. Reports no chest pain, pressure, or tightness. No edema, orthopnea, PND. Reports no palpitations.    EKGs/Labs/Other Studies Reviewed:   The following studies were reviewed today:  Cardiac Studies & Procedures     STRESS TESTS  MYOCARDIAL PERFUSION IMAGING 09/17/2021  Narrative   The study is normal. The study is low risk.   No ST deviation was noted.   LV perfusion is normal. There is no evidence of ischemia. There is no evidence of infarction.   Left ventricular function is normal. Nuclear stress EF: 67 %. The left ventricular ejection fraction is hyperdynamic (>65%). End diastolic cavity size is normal. End systolic cavity size is normal.   Prior study available for comparison from 08/24/2015. No changes compared to prior study.   ECHOCARDIOGRAM  ECHOCARDIOGRAM COMPLETE 09/17/2021  Narrative ECHOCARDIOGRAM REPORT    Patient Name:   Claudia [name] Date of Exam: 09/17/2021 Medical Rec #:  [id number]          Height:       70.0 in Accession #:    [id number]         Weight:       179.0 lb Date of Birth:  [birth year]          BSA:  1.991 m Patient Age:    84 years           BP:           124/66 mmHg Patient Gender: F                  HR:           50 bpm. Exam Location:  [facility]  Procedure: 2D Echo, 3D Echo, Cardiac Doppler, Color Doppler and Strain Analysis  Indications:    R60.0 Edema  History:        Patient has prior history of Echocardiogram examinations, most recent 03/30/2016. Arrythmias:Atrial Fibrillation, Signs/Symptoms:Edema; Risk Factors:Hypertension, Dyslipidemia and Family History of Coronary Artery Disease.  Sonographer:    Deliah Boston RDCS Referring Phys: [place]   1. Left ventricular ejection fraction, by estimation, is 60 to 65%. Left  ventricular ejection fraction by 3D volume is 70 %. The left ventricle has normal function. The left ventricle has no regional wall motion abnormalities. Left ventricular diastolic parameters are consistent with Grade I diastolic dysfunction (impaired relaxation). The average left ventricular global longitudinal strain is 23.2 %. The global longitudinal strain is normal. 2. Right ventricular systolic function is normal. The right ventricular size is normal. There is normal pulmonary artery systolic pressure. The estimated right ventricular systolic pressure is 123456 mmHg. 3. The mitral valve is normal in structure. Trivial mitral valve regurgitation. No evidence of mitral stenosis. 4. The aortic valve is normal in structure. Aortic valve regurgitation is trivial. No aortic stenosis is present. 5. The inferior vena cava is normal in size with greater than 50% respiratory variability, suggesting right atrial pressure of 3 mmHg.  FINDINGS Left Ventricle: Left ventricular ejection fraction, by estimation, is 60 to 65%. Left ventricular ejection fraction by 3D volume is 70 %. The left ventricle has normal function. The left ventricle has no regional wall motion abnormalities. The average left ventricular global longitudinal strain is 23.2 %. The global longitudinal strain is normal. The left ventricular internal cavity size was normal in size. There is no left ventricular hypertrophy. Left ventricular diastolic parameters are consistent with Grade I diastolic dysfunction (impaired relaxation). Normal left ventricular filling pressure.  Right Ventricle: The right ventricular size is normal. No increase in right ventricular wall thickness. Right ventricular systolic function is normal. There is normal pulmonary artery systolic pressure. The tricuspid regurgitant velocity is 2.66 m/s, and with an assumed right atrial pressure of 3 mmHg, the estimated right ventricular systolic pressure is 123456 mmHg.  Left  Atrium: Left atrial size was normal in size.  Right Atrium: Right atrial size was normal in size.  Pericardium: There is no evidence of pericardial effusion.  Mitral Valve: The mitral valve is normal in structure. Trivial mitral valve regurgitation. No evidence of mitral valve stenosis.  Tricuspid Valve: The tricuspid valve is normal in structure. Tricuspid valve regurgitation is trivial. No evidence of tricuspid stenosis.  Aortic Valve: The aortic valve is normal in structure. Aortic valve regurgitation is trivial. Aortic regurgitation PHT measures 485 msec. No aortic stenosis is present.  Pulmonic Valve: The pulmonic valve was grossly normal. Pulmonic valve regurgitation is mild. No evidence of pulmonic stenosis.  Aorta: The aortic root is normal in size and structure.  Venous: The inferior vena cava is normal in size with greater than 50% respiratory variability, suggesting right atrial pressure of 3 mmHg.  IAS/Shunts: No atrial level shunt detected by color flow Doppler.   LEFT VENTRICLE PLAX  2D LVIDd:         3.50 cm   Diastology LVIDs:         2.45 cm   LV e' medial:    5.55 cm/s LV PW:         1.10 cm   LV E/e' medial:  9.1 LV IVS:        0.70 cm   LV e' lateral:   9.79 cm/s LVOT diam:     2.90 cm   LV E/e' lateral: 5.1 LV SV:         174 LV SV Index:   88        2D Longitudinal Strain LVOT Area:     6.61 cm  2D Strain GLS (A2C):   23.6 % 2D Strain GLS (A3C):   21.4 % 2D Strain GLS (A4C):   24.7 % 2D Strain GLS Avg:     23.2 %  3D Volume EF: 3D EF:        70 % LV EDV:       118 ml LV ESV:       35 ml LV SV:        82 ml  RIGHT VENTRICLE RV Basal diam:  3.80 cm RV S prime:     9.36 cm/s TAPSE (M-mode): 3.1 cm  LEFT ATRIUM             Index        RIGHT ATRIUM           Index LA diam:        3.30 cm 1.66 cm/m   RA Area:     18.70 cm LA Vol (A2C):   54.8 ml 27.52 ml/m  RA Volume:   52.80 ml  26.52 ml/m LA Vol (A4C):   73.7 ml 37.01 ml/m LA Biplane Vol: 66.2  ml 33.25 ml/m AORTIC VALVE LVOT Vmax:   118.50 cm/s LVOT Vmean:  68.650 cm/s LVOT VTI:    0.264 m AI PHT:      485 msec  AORTA Ao Root diam: 3.40 cm Ao Asc diam:  3.70 cm  MITRAL VALVE               TRICUSPID VALVE MV Area (PHT): cm         TR Peak grad:   28.3 mmHg MV Decel Time: 414 msec    TR Vmax:        266.00 cm/s MV E velocity: 50.35 cm/s MV A velocity: 73.10 cm/s  SHUNTS MV E/A ratio:  0.69        Systemic VTI:  0.26 m Systemic Diam: 2.90 cm  Dani Gobble Croitoru MD Electronically signed by Sanda Klein MD Signature Date/Time: 09/17/2021/3:27:05 PM    Final    MONITORS  CARDIAC EVENT MONITOR 07/08/2020  Narrative 30 Day Event Monitor  Quality: Fair.  Baseline artifact. Predominant rhythm: sinus rhythm Average heart rate: 62 bpm Max heart rate: 197 bpm Min heart rate: 35 bpm Pauses >2.5 seconds: none  Episodes of atrial fibrillation up to 21 minutes Atrial runs Up to 13 beats NSVT PVCs and ventricular bigeminy  Tiffany C. Oval Linsey, MD, [facility] 07/08/2020 8:11 PM            EKG:  EKG is ordered today.  The ekg ordered today demonstrates NSR 63 bpm with no acute ST/T wave changes.  Recent Labs: 10/19/2021: ALT 11 06/14/2022: BUN 23; Creatinine, Ser 1.05; Hemoglobin 12.0; Platelets 215.0; Potassium 4.3; Sodium 142; TSH 3.94  Recent Lipid Panel    Component Value Date/Time   CHOL 148 05/05/2021 1144   TRIG 85.0 05/05/2021 1144   HDL 54.40 05/05/2021 1144   CHOLHDL 3 05/05/2021 1144   VLDL 17.0 05/05/2021 1144   LDLCALC 77 05/05/2021 1144   LDLDIRECT 158.9 09/05/2012 1038    Risk Assessment/Calculations:   CHA2DS2-VASc Score = 4   This indicates a 4.8% annual risk of stroke. The patient's score is based upon: CHF History: 0 HTN History: 1 Diabetes History: 0 Stroke History: 0 Vascular Disease History: 0 Age Score: 2 Gender Score: 1     Home Medications   Current Meds  Medication Sig   Cholecalciferol 50 MCG (2000 UT) TABS Take 1 tablet  (2,000 Units total) by mouth daily.   diltiazem (CARDIZEM) 30 MG tablet TAKE ONE TABLET EVERY 4 HOURS AS NEEDED FOR afib heart rate greater THAN 100 aslong as blood pressure less THAN 100   HYDROcodone-acetaminophen (NORCO/VICODIN) 5-325 MG tablet Take 1 tablet by mouth every 6 (six) hours as needed. for pain   linaclotide (LINZESS) 290 MCG CAPS capsule Take 1 capsule (290 mcg total) by mouth daily before breakfast.   Rivaroxaban (XARELTO) 15 MG TABS tablet Take 1 tablet (15 mg total) by mouth daily with supper.   torsemide (DEMADEX) 20 MG tablet TAKE ONE TABLET BY MOUTH ONCE DAILY   vitamin C (ASCORBIC ACID) 500 MG tablet Take 500 mg by mouth daily.   Current Facility-Administered Medications for the 09/13/22 encounter (Office Visit) with Loel Dubonnet, NP  Medication   cyanocobalamin (VITAMIN B12) injection 1,000 mcg     Review of Systems      All other systems reviewed and are otherwise negative except as noted above.  Physical Exam    VS:  BP 132/64   Pulse 63   Ht 5\' 10"  (1.778 m)   Wt 158 lb (71.7 kg)   BMI 22.67 kg/m  , BMI Body mass index is 22.67 kg/m.  Wt Readings from Last 3 Encounters:  09/13/22 158 lb (71.7 kg)  06/14/22 165 lb (74.8 kg)  02/15/22 159 lb (72.1 kg)     GEN: Well nourished, well developed, in no acute distress. HEENT: normal. Neck: Supple, no JVD, carotid bruits, or masses. Cardiac: RRR, no murmurs, rubs, or gallops. No clubbing, cyanosis, edema.  Radials/PT 2+ and equal bilaterally.  Respiratory:  Respirations regular and unlabored, clear to auscultation bilaterally. GI: Soft, nontender, nondistended. MS: No deformity or atrophy. Skin: Warm and dry, no rash. Neuro:  Strength and sensation are intact. Psych: Normal affect.  Assessment & Plan    Hypertension - BP well controlled at home. Only mildly elevated in clinic today 132/64. Continue current antihypertensive regimen.    PAF / Hypercoagulable state -continue sinus rhythm.  Denies  palpitations.  Continue Xarelto 50 mg daily.  Denies bleeding complications.         Disposition: Follow up in 1 year(s) with Skeet Latch, MD or APP.  Signed, Loel Dubonnet, NP 09/13/2022, 4:39 PM [facility]

## 2022-09-14 ENCOUNTER — Ambulatory Visit (INDEPENDENT_AMBULATORY_CARE_PROVIDER_SITE_OTHER): Payer: HMO | Admitting: Rehabilitative and Restorative Service Providers"

## 2022-09-14 ENCOUNTER — Encounter: Payer: Self-pay | Admitting: Rehabilitative and Restorative Service Providers"

## 2022-09-14 DIAGNOSIS — M545 Low back pain, unspecified: Secondary | ICD-10-CM

## 2022-09-14 DIAGNOSIS — M6281 Muscle weakness (generalized): Secondary | ICD-10-CM | POA: Diagnosis not present

## 2022-09-14 DIAGNOSIS — R2689 Other abnormalities of gait and mobility: Secondary | ICD-10-CM | POA: Diagnosis not present

## 2022-09-14 DIAGNOSIS — R293 Abnormal posture: Secondary | ICD-10-CM

## 2022-09-14 DIAGNOSIS — M542 Cervicalgia: Secondary | ICD-10-CM | POA: Diagnosis not present

## 2022-09-14 DIAGNOSIS — G8929 Other chronic pain: Secondary | ICD-10-CM | POA: Diagnosis not present

## 2022-09-14 NOTE — Therapy (Signed)
OUTPATIENT PHYSICAL THERAPY TREATMENT NOTE    Patient Name: Claudia [name] MRN: [id number] DOB:1934-05-13, 89 y.o., female Today's Date: 09/14/2022   END OF SESSION:   PT End of Session - 09/14/22 1258     Visit Number 46    Number of Visits 50    Date for PT Re-Evaluation 09/23/22    Authorization Type [employer] $15 copay    PT Start Time 1259    PT Stop Time 1338    PT Time Calculation (min) 39 min    Equipment Utilized During Treatment Gait belt    Activity Tolerance Patient tolerated treatment well    Behavior During Therapy WFL for tasks assessed/performed                 Past Medical History:  Diagnosis Date   Abnormality of gait    Brachial neuritis or radiculitis NOS    Degeneration of lumbar or lumbosacral intervertebral disc    Dysrhythmia    Essential hypertension, benign    Gouty arthropathy    Lumbago    Obesity    Osteoarthritis    Pure hypercholesterolemia    Unspecified hypothyroidism    Past Surgical History:  Procedure Laterality Date   ABDOMINAL HYSTERECTOMY  03/13/2010   ANTERIOR CERVICAL DECOMP/DISCECTOMY FUSION N/A 10/19/2021   Procedure: C3-4, C4-5, C5-6 ANTERIOR CERVICAL DISCECTOMY FUSION, ALLOGRAFT, PLATE;  Surgeon: [name], MD;  Location: [facility];  Service: Orthopedics;  Laterality: N/A;   BACK SURGERY     LUMBAR LAMINECTOMY  03/13/2010   right knee replacement  03/13/2010   Patient Active Problem List   Diagnosis Date Noted   Vitamin D deficiency disease 06/21/2022   Encounter for general adult medical examination with abnormal findings 06/21/2022   Vitamin B12 deficiency neuropathy ([place]) 02/15/2022   Hypercalcemia 02/15/2022   Cervical stricture or stenosis 10/20/2021   Cervical spinal stenosis 10/19/2021   Meningioma ([place]) 08/24/2021   PAF (paroxysmal atrial fibrillation) ([place]) 11/03/2020   Stage 3b chronic kidney disease ([place]) 05/06/2020   Spinal stenosis in cervical region 05/12/2016   Therapeutic opioid-induced  constipation ([facility]) 01/01/2016   Chronic idiopathic constipation 09/11/2013   Lumbosacral spondylosis without myelopathy 04/09/2013   Postlaminectomy syndrome, lumbar region 04/09/2013   Anticoagulation management encounter 02/01/2012   Neuropathy, peripheral 08/04/2011   Pure hypercholesterolemia 08/04/2011   Essential hypertension, benign 08/04/2011   Hypothyroidism 08/04/2011   DJD (degenerative joint disease) of knee 08/04/2011   Gout 08/04/2011      THERAPY DIAG:  Cervicalgia  Other abnormalities of gait and mobility  Muscle weakness (generalized)  Abnormal posture  Chronic bilateral low back pain without sciatica  PCP: Janith Lima. MD   REFERRING PROVIDER: [name], MD   REFERRING DIAG: Z98.1 (ICD-10-CM) - S/P cervical spinal fusion    Rationale for Evaluation and Treatment Rehabilitation   ONSET DATE: Dec 2022   SUBJECTIVE:  SUBJECTIVE STATEMENT: She indicated she had to miss last visit due to her heat going out.  She reported no other change of complaints since last visit.  She reported working on her HEP at home.     PERTINENT HISTORY:  History of 10/19/21 C3-4 C4-5 C5-6 ACDF with soft collar PMH bracial neuritis, abnormal gait, Degeneration of lumbar, HTN, gouty arthropathy, OA, lumbar laminectomy 2011, Rt TKA   PAIN:  No pain upon arrival.    PRECAUTIONS: None   WEIGHT BEARING RESTRICTIONS No   FALLS:  Has patient fallen in last 6 months? Maybe 1 fall in last 6 months.  Several falls within last year.    LIVING ENVIRONMENT: Lives with:lives alone Lives in: House/apartment Stairs: 3 steps to enter deck, rail on Lt going up.  No stairs in house.  Has following equipment at home: FWW, cane   OCCUPATION: retired    PLOF: Independent  ,  cooking/cleaning, shopping/grocery store   [place] stronger, walking independently.     OBJECTIVE:    PATIENT SURVEYS:  01/25/2022 No foto - incorrect set up   COGNITION: 01/25/2022 Overall cognitive status: Within functional limits for tasks assessed    SENSATION: 01/25/2022 not tested today   POSTURE:  01/25/2022 rounded shoulders, forward head, increased thoracic kyphosis, and flexed trunk    PALPATION: 01/25/2022 no specific tenderness noted in screening for balance.      MMT: 01/25/2022:  Seated resisted static testing hip abduction Rt: strong/painless. Lt moderate strength, painless (held MMT due to difficulty in bed mobility positioning MMT Right 01/25/2022 Left 01/25/2022 Left 02/22/2022 Left 03/11/2022 Left 03/16/22 Left 04/01/2022 Left 04/05/22 Left/Right 05/24/22 Left/Right 07/21/22 Right/ Left 08/26/2022  Shoulder flexion              Shoulder extension              Shoulder abduction              Shoulder adduction              Shoulder extension              Shoulder internal rotation              Shoulder external rotation                                            Hip flexion 5/5 4/5 4/5 4/5 4/5 4+/5 4+ 4+/4+ 4+/4+ 5  /  4+  Hip extension              Hip abduction              Knee flexion 5/5 5/5        5   /   5  Knee extension  5/5 5/5    4+/5 4+ 4+/4+ 4+/4+ 5   / 4+  Ankle DF 5/5 4/5  4/5 4+/5    5/5 5  /  4+   (Blank rows = not tested)   CERVICAL SPECIAL TESTS:  01/25/2022 none performed   FUNCTIONAL TESTS:  3/12/024:    TUG with rollator 30.6 seconds  08/26/2022:   TUG with FWW: 33.1 seconds   TUG with rollator: 30.4 seconds  06/03/2022:  TUG c FWW:  34.95 seconds, 32.84 seconds 05/24/22: TUG c FWW:  36 seconds 05/06/2022: TUG c FWW:  37 seconds  at end of session 04/05/22 TUG 38.5s seconds with FWW 03/22/22: TUG 44 seconds C FWW (done at very end of session) 03/08/2022:   TUG:  41.76 seconds c FWW 01/25/2022        TUG:   71 seconds c  FWW     08/26/22 0001  Berg Balance Test  Sit to Stand 3  Standing Unsupported 3  Sitting with Back Unsupported but Feet Supported on Floor or Stool 4  Stand to Sit 3  Transfers 3  Standing Unsupported with Eyes Closed 3  Standing Unsupported with Feet Together 3  From Standing, Reach Forward with Outstretched Arm 1  From Standing Position, Pick up Object from Floor 3  From Standing Position, Turn to Look Behind Over each Shoulder 2  Turn 360 Degrees 1  Standing Unsupported, Alternately Place Feet on Step/Stool 0  Standing Unsupported, One Foot in Front 2  Standing on One Leg 0  Total Score 31       06/24/22 0001  Berg Balance Test  Sit to Stand 3  Standing Unsupported 3  Sitting with Back Unsupported but Feet Supported on Floor or Stool 4  Stand to Sit 3  Transfers 3  Standing Unsupported with Eyes Closed 3  Standing Unsupported with Feet Together 3  From Standing, Reach Forward with Outstretched Arm 3  From Standing Position, Pick up Object from Floor 3  From Standing Position, Turn to Look Behind Over each Shoulder 2  Turn 360 Degrees 1  Standing Unsupported, Alternately Place Feet on Step/Stool 0  Standing Unsupported, One Foot in Front 2  Standing on One Leg 0  Total Score 33  (previously 21 on 01/25/2022    01/25/2022 BERG 21    GAIT ASSESSMENT: 09/14/2022:  Able to perform SPC c SBA household distances within clinic environment.   08/26/2022:  Mod independent c rollator : 250 ft prior to fatigue.   2 min walk test 125 ft c rollator  08/19/2022:  Mod independent c FWW and rollator household distances in clinic.   07/09/2022:  Ambulation mod independent c FWW with marked reduced gait speed, limited in distance due to fatigue (household distances).  Able to perform ambulation c SPC c CGA to occasional SBA within clinic distances < 100 ft.   03/18/2022:  FWW into clinic c mod independent, decreased gait speed.  SPC use in Rt UE c CGA 33 ft today c cues for  sequencing.    TODAY'S TREATMENT:  09/14/2022 Therex: Recumbent bike 10 mins lvl 2, seat 7 Leg press double leg 81 lbs x 20, single leg 31 lbs 2 x 15 bilateral    TherActivity SPC use in clinic household distances < 100 ft between locations in clinic c SBA.  Performed to improve endurance and safety with SPC Forward step up in // bars with Rt hand rail assist and CGA x 10 bilateral 4 inch step BIG inspired forward stepping c Rt hand on bar (simulated cane use) x 10 bilateral c CGA to improve step length  09/07/2022 Therex: Recumbent bike 12 mins lvl 2, seat 7 Forward step up in // bars with bilateral hand rail assist and CGA x 10 bilateral 4 inch step 6 inch hurdle step over and back in // bars with bilateral hand rail assist c CGA x 10 bilateral Leg press double leg 81 lbs x 20, single leg 31 lbs 2 x 15 bilateral  TUG x 1 c rollator    08/30/2022 Therex: Recumbent bike 10 mins lvl  1, seat 7 Seated green band around knees clamshell with isometric hold opposite leg x 20 bilateral Seated marching green band around knees x 15 bilateral Seated LAQ 2-3 sec hold end range x 15 bilateral Sit to stand to sit with focus on slow lowering and reduced hand assist as possible x 5  18 inch chair c airex pad in it   Spent time c cues verbal, visual on HEP review and handout provided.   Neuro Re-ed Corner standing:  Alt heel/toe raises x 20 with bilateral hand assist on bars (cues for use Rt hand only at times) - cues for home use  Modified tandem stance 30 sec x 2 bilateral - cues for home use   08/26/2022: Therex: Recumbent bike Lvl 1 10 mins seat 8 Sit to stand to sit x 10 18 inch chair c hand assist bilateral as necessary  Physical Performance Testing: Extended time for education, cues and performance of testing listed above including BERG balance testing, TUG with rollator and with FWW, 2 min walk test/distance walk testing.     08/23/2022 Therex: Recumbent bike lvl 3-1 x10 min,  seat 8  Leg press double leg 81# 2x15, single leg 31# x20 bilateral Leg extension machine Double leg up, single leg lowering 3 x 10 5 lbs  Leg curl machine double leg 20 lbs 3x10  TherActivity:   -Rollator ramp up/down with CGA  -Rollator up/down 4 inch step c CGA at times c cues for rollator positioning   - Rollator up ramp and down 2 steps with CGA    PATIENT EDUCATION:  08/30/2022 Education details: HEP update Person educated: Patient Education method: Consulting civil engineer, Demonstration, Verbal cues, and Handouts Education comprehension: verbalized understanding, returned demonstration, and verbal cues required     HOME EXERCISE PROGRAM: Access Code: [email or web address] URL: [email or web address] Date: 08/30/2022 Prepared by: Scot Jun  Exercises - Seated March  - 1 x daily - 7 x weekly - 1-2 sets - 10 reps - Sit to Stand  - 1 x daily - 7 x weekly - 1-2 sets - 5-10 reps - Seated Long Arc Quad  - 1 x daily - 7 x weekly - 1-2 sets - 10 reps - 2 hold - Heel Toe Raises with Counter Support  - 1 x daily - 7 x weekly - 1-2 sets - 10 reps - Standing Tandem Balance with Counter Support  - 1 x daily - 7 x weekly - 1 sets - 3-5 reps - 20-30 hold - Seated Quad Set (Mirrored)  - 1-2 x daily - 7 x weekly - 1 sets - 10 reps - 5 hold - Seated Straight Leg Heel Taps  - 1-2 x daily - 7 x weekly - 1-2 sets - 5-10 reps - Seated Hip Abduction with Resistance  - 1-2 x daily - 7 x weekly - 3 sets - 10 reps   ASSESSMENT:   CLINICAL IMPRESSION:  Pt continued to show Lt leg weakness that impairs functional walking, standing, balance and stair navigation but slow steady improvements over course of treatment.  SBA to CGA required while in clinic in WB activity but not incidents requiring more than that level which was improvement.   OBJECTIVE IMPAIRMENTS Abnormal gait, decreased activity tolerance, decreased balance, decreased endurance, decreased mobility, difficulty walking, decreased strength,  impaired perceived functional ability, impaired flexibility, improper body mechanics, postural dysfunction, and pain.    ACTIVITY LIMITATIONS carrying, lifting, bending, standing, squatting, stairs, transfers, bed mobility, reach over head, and locomotion level  PARTICIPATION LIMITATIONS: meal prep, cleaning, interpersonal relationship, driving, shopping, and community activity   PERSONAL FACTORS   History of 10/19/21 C3-4 C4-5 C5-6 ACDF with soft collar PMH bracial neuritis, abnormal gait, Degeneration of lumbar, HTN, gouty arthropathy, OA, lumbar laminectomy 2011, Rt TKA  are also affecting patient's functional outcome.    REHAB POTENTIAL: Good   CLINICAL DECISION MAKING: Stable/uncomplicated   EVALUATION COMPLEXITY: Low     GOALS: Goals reviewed with patient? Yes   Short term PT Goals (target date for Short term goals are 4 weeks 06/23/22) Patient will demonstrate independent use of home exercise program to maintain progress from in clinic treatments. Goal status: MET   Long term PT goals (target dates for all long term goals are 4 weeks  09/23/2022 )   1. Patient will demonstrate/report pain at worst less than or equal to 2/10 to facilitate minimal limitation in daily activity secondary to pain symptoms. Goal status: MET 04/05/22   2. Patient will demonstrate independent use of home exercise program to facilitate ability to maintain/progress functional gains from skilled physical therapy services. Goal status: MET 05/24/22   3. Patient will demonstrate bilateral hip MMT 5/5 throughout to facilitate improved transfers, ambulation towards independence.  Goal status: on going 09/14/2022   4.  Patient will demonstrate BERG testing > or = 40 to indicate reduced fall risk.  Goal status:  on going 09/14/2022    5.  Patient will demonstrate TUG c LRAD < or = 20 seconds to indicate reduced fall risk/ improved community ambulation.    Goal status:  on going 09/14/2022    6.  Patient  will demonstrate ability to ambulation c LRAD (SPC, independent) community distances > 300 ft s rest  Goal status: on going 09/14/2022      PLAN: PT FREQUENCY: 1-2x/week   PT DURATION: 4 more  weeks from recert   PLANNED INTERVENTIONS: Therapeutic exercises, Therapeutic activity, Neuro Muscular re-education, Balance training, Gait training, Patient/Family education, Joint mobilization, Stair training, DME instructions, Dry Needling, Electrical stimulation, Cryotherapy, Moist heat, Taping, Ultrasound, Ionotophoresis 4mg /ml Dexamethasone, and Manual therapy.  All included unless contraindicated   PLAN FOR NEXT SESSION:  Continued strengthening in machines (no available at home) and functional movements/balance.    Scot Jun, PT, DPT, OCS, ATC 09/14/22  1:43 PM

## 2022-09-16 ENCOUNTER — Ambulatory Visit (INDEPENDENT_AMBULATORY_CARE_PROVIDER_SITE_OTHER): Payer: HMO | Admitting: Physical Therapy

## 2022-09-16 DIAGNOSIS — R2689 Other abnormalities of gait and mobility: Secondary | ICD-10-CM

## 2022-09-16 DIAGNOSIS — M542 Cervicalgia: Secondary | ICD-10-CM

## 2022-09-16 DIAGNOSIS — R293 Abnormal posture: Secondary | ICD-10-CM

## 2022-09-16 DIAGNOSIS — M6281 Muscle weakness (generalized): Secondary | ICD-10-CM

## 2022-09-16 NOTE — Therapy (Signed)
OUTPATIENT PHYSICAL THERAPY TREATMENT NOTE    Patient Name: Claudia [name] MRN: [id number] DOB:Jul 20, 1933, 89 y.o., female Today's Date: 09/16/2022   END OF SESSION:   PT End of Session - 09/16/22 1317     Visit Number 47    Number of Visits 50    Date for PT Re-Evaluation 09/23/22    Authorization Type [employer] $15 copay    PT Start Time 1301    PT Stop Time 1345    PT Time Calculation (min) 44 min    Equipment Utilized During Treatment Gait belt    Activity Tolerance Patient tolerated treatment well    Behavior During Therapy WFL for tasks assessed/performed                 Past Medical History:  Diagnosis Date   Abnormality of gait    Brachial neuritis or radiculitis NOS    Degeneration of lumbar or lumbosacral intervertebral disc    Dysrhythmia    Essential hypertension, benign    Gouty arthropathy    Lumbago    Obesity    Osteoarthritis    Pure hypercholesterolemia    Unspecified hypothyroidism    Past Surgical History:  Procedure Laterality Date   ABDOMINAL HYSTERECTOMY  03/13/2010   ANTERIOR CERVICAL DECOMP/DISCECTOMY FUSION N/A 10/19/2021   Procedure: C3-4, C4-5, C5-6 ANTERIOR CERVICAL DISCECTOMY FUSION, ALLOGRAFT, PLATE;  Surgeon: [name], MD;  Location: [facility];  Service: Orthopedics;  Laterality: N/A;   BACK SURGERY     LUMBAR LAMINECTOMY  03/13/2010   right knee replacement  03/13/2010   Patient Active Problem List   Diagnosis Date Noted   Vitamin D deficiency disease 06/21/2022   Encounter for general adult medical examination with abnormal findings 06/21/2022   Vitamin B12 deficiency neuropathy ([place]) 02/15/2022   Hypercalcemia 02/15/2022   Cervical stricture or stenosis 10/20/2021   Cervical spinal stenosis 10/19/2021   Meningioma ([place]) 08/24/2021   PAF (paroxysmal atrial fibrillation) ([place]) 11/03/2020   Stage 3b chronic kidney disease ([place]) 05/06/2020   Spinal stenosis in cervical region 05/12/2016   Therapeutic opioid-induced  constipation ([facility]) 01/01/2016   Chronic idiopathic constipation 09/11/2013   Lumbosacral spondylosis without myelopathy 04/09/2013   Postlaminectomy syndrome, lumbar region 04/09/2013   Anticoagulation management encounter 02/01/2012   Neuropathy, peripheral 08/04/2011   Pure hypercholesterolemia 08/04/2011   Essential hypertension, benign 08/04/2011   Hypothyroidism 08/04/2011   DJD (degenerative joint disease) of knee 08/04/2011   Gout 08/04/2011      THERAPY DIAG:  Cervicalgia  Other abnormalities of gait and mobility  Muscle weakness (generalized)  Abnormal posture  PCP: Janith Lima. MD   REFERRING PROVIDER: [name], MD   REFERRING DIAG: Z98.1 (ICD-10-CM) - S/P cervical spinal fusion    Rationale for Evaluation and Treatment Rehabilitation   ONSET DATE: Dec 2022   SUBJECTIVE:  SUBJECTIVE STATEMENT: She understands plan to finish up PT after then next 2 weeks, she has been working on [employer] and walking with cane at home.    PERTINENT HISTORY:  History of 10/19/21 C3-4 C4-5 C5-6 ACDF with soft collar PMH bracial neuritis, abnormal gait, Degeneration of lumbar, HTN, gouty arthropathy, OA, lumbar laminectomy 2011, Rt TKA   PAIN:  No pain upon arrival.    PRECAUTIONS: None   WEIGHT BEARING RESTRICTIONS No   FALLS:  Has patient fallen in last 6 months? Maybe 1 fall in last 6 months.  Several falls within last year.    LIVING ENVIRONMENT: Lives with:lives alone Lives in: House/apartment Stairs: 3 steps to enter deck, rail on Lt going up.  No stairs in house.  Has following equipment at home: FWW, cane   OCCUPATION: retired    PLOF: Independent  , cooking/cleaning, shopping/grocery store   [place] stronger, walking independently.      OBJECTIVE:    PATIENT SURVEYS:  01/25/2022 No foto - incorrect set up   COGNITION: 01/25/2022 Overall cognitive status: Within functional limits for tasks assessed    SENSATION: 01/25/2022 not tested today   POSTURE:  01/25/2022 rounded shoulders, forward head, increased thoracic kyphosis, and flexed trunk    PALPATION: 01/25/2022 no specific tenderness noted in screening for balance.      MMT: 01/25/2022:  Seated resisted static testing hip abduction Rt: strong/painless. Lt moderate strength, painless (held MMT due to difficulty in bed mobility positioning MMT Right 01/25/2022 Left 01/25/2022 Left 02/22/2022 Left 03/11/2022 Left 03/16/22 Left 04/01/2022 Left 04/05/22 Left/Right 05/24/22 Left/Right 07/21/22 Right/ Left 08/26/2022  Shoulder flexion              Shoulder extension              Shoulder abduction              Shoulder adduction              Shoulder extension              Shoulder internal rotation              Shoulder external rotation                                            Hip flexion 5/5 4/5 4/5 4/5 4/5 4+/5 4+ 4+/4+ 4+/4+ 5  /  4+  Hip extension              Hip abduction              Knee flexion 5/5 5/5        5   /   5  Knee extension  5/5 5/5    4+/5 4+ 4+/4+ 4+/4+ 5   / 4+  Ankle DF 5/5 4/5  4/5 4+/5    5/5 5  /  4+   (Blank rows = not tested)   CERVICAL SPECIAL TESTS:  01/25/2022 none performed   FUNCTIONAL TESTS:  3/12/024:    TUG with rollator 30.6 seconds  08/26/2022:   TUG with FWW: 33.1 seconds   TUG with rollator: 30.4 seconds  06/03/2022:  TUG c FWW:  34.95 seconds, 32.84 seconds 05/24/22: TUG c FWW:  36 seconds 05/06/2022: TUG c FWW:  37 seconds at end of session 04/05/22 TUG 38.5s seconds with FWW 03/22/22:  TUG 44 seconds C FWW (done at very end of session) 03/08/2022:   TUG:  41.76 seconds c FWW 01/25/2022        TUG:   71 seconds c FWW     08/26/22 0001  Berg Balance Test  Sit to Stand 3  Standing Unsupported 3  Sitting with  Back Unsupported but Feet Supported on Floor or Stool 4  Stand to Sit 3  Transfers 3  Standing Unsupported with Eyes Closed 3  Standing Unsupported with Feet Together 3  From Standing, Reach Forward with Outstretched Arm 1  From Standing Position, Pick up Object from Floor 3  From Standing Position, Turn to Look Behind Over each Shoulder 2  Turn 360 Degrees 1  Standing Unsupported, Alternately Place Feet on Step/Stool 0  Standing Unsupported, One Foot in Front 2  Standing on One Leg 0  Total Score 31       06/24/22 0001  Berg Balance Test  Sit to Stand 3  Standing Unsupported 3  Sitting with Back Unsupported but Feet Supported on Floor or Stool 4  Stand to Sit 3  Transfers 3  Standing Unsupported with Eyes Closed 3  Standing Unsupported with Feet Together 3  From Standing, Reach Forward with Outstretched Arm 3  From Standing Position, Pick up Object from Floor 3  From Standing Position, Turn to Look Behind Over each Shoulder 2  Turn 360 Degrees 1  Standing Unsupported, Alternately Place Feet on Step/Stool 0  Standing Unsupported, One Foot in Front 2  Standing on One Leg 0  Total Score 33  (previously 21 on 01/25/2022    01/25/2022 BERG 21    GAIT ASSESSMENT: 09/14/2022:  Able to perform SPC c SBA household distances within clinic environment.   08/26/2022:  Mod independent c rollator : 250 ft prior to fatigue.   2 min walk test 125 ft c rollator  08/19/2022:  Mod independent c FWW and rollator household distances in clinic.   07/09/2022:  Ambulation mod independent c FWW with marked reduced gait speed, limited in distance due to fatigue (household distances).  Able to perform ambulation c SPC c CGA to occasional SBA within clinic distances < 100 ft.   03/18/2022:  FWW into clinic c mod independent, decreased gait speed.  SPC use in Rt UE c CGA 33 ft today c cues for sequencing.    TODAY'S TREATMENT:  09/16/2022 Therex: Recumbent bike 10 mins lvl 2, seat 7 Leg press double  leg 81 lbs x 20, single leg 31 lbs 2 x 15 bilateral  Leg extension machine Double leg, 10# 3X10   TherActivity SPC use in clinic household distances < 100 ft between locations in clinic c SBA.  Performed to improve endurance and safety with SPC Forward step up in // bars with Rt hand rail assist and CGA x 10 bilateral 4 inch step 6 inch hurdle step over and back in // bars with bilateral hand rail assist c CGA x 10 bilateral   09/14/2022 Therex: Recumbent bike 10 mins lvl 2, seat 7 Leg press double leg 81 lbs x 20, single leg 31 lbs 2 x 15 bilateral    TherActivity SPC use in clinic household distances < 100 ft between locations in clinic c SBA.  Performed to improve endurance and safety with SPC Forward step up in // bars with Rt hand rail assist and CGA x 10 bilateral 4 inch step BIG inspired forward stepping c Rt hand on bar (simulated cane use)  x 10 bilateral c CGA to improve step length  09/07/2022 Therex: Recumbent bike 12 mins lvl 2, seat 7 Forward step up in // bars with bilateral hand rail assist and CGA x 10 bilateral 4 inch step 6 inch hurdle step over and back in // bars with bilateral hand rail assist c CGA x 10 bilateral Leg press double leg 81 lbs x 20, single leg 31 lbs 2 x 15 bilateral  TUG x 1 c rollator    08/30/2022 Therex: Recumbent bike 10 mins lvl 1, seat 7 Seated green band around knees clamshell with isometric hold opposite leg x 20 bilateral Seated marching green band around knees x 15 bilateral Seated LAQ 2-3 sec hold end range x 15 bilateral Sit to stand to sit with focus on slow lowering and reduced hand assist as possible x 5  18 inch chair c airex pad in it   Spent time c cues verbal, visual on HEP review and handout provided.   Neuro Re-ed Corner standing:  Alt heel/toe raises x 20 with bilateral hand assist on bars (cues for use Rt hand only at times) - cues for home use  Modified tandem stance 30 sec x 2 bilateral - cues for home  use   08/26/2022: Therex: Recumbent bike Lvl 1 10 mins seat 8 Sit to stand to sit x 10 18 inch chair c hand assist bilateral as necessary  Physical Performance Testing: Extended time for education, cues and performance of testing listed above including BERG balance testing, TUG with rollator and with FWW, 2 min walk test/distance walk testing.     08/23/2022 Therex: Recumbent bike lvl 3-1 x10 min, seat 8  Leg press double leg 81# 2x15, single leg 31# x20 bilateral Leg extension machine Double leg up, single leg lowering 3 x 10 5 lbs  Leg curl machine double leg 20 lbs 3x10  TherActivity:   -Rollator ramp up/down with CGA  -Rollator up/down 4 inch step c CGA at times c cues for rollator positioning   - Rollator up ramp and down 2 steps with CGA    PATIENT EDUCATION:  08/30/2022 Education details: HEP update Person educated: Patient Education method: Consulting civil engineer, Demonstration, Verbal cues, and Handouts Education comprehension: verbalized understanding, returned demonstration, and verbal cues required     HOME EXERCISE PROGRAM: Access Code: [email or web address] URL: [email or web address] Date: 08/30/2022 Prepared by: Scot Jun  Exercises - Seated March  - 1 x daily - 7 x weekly - 1-2 sets - 10 reps - Sit to Stand  - 1 x daily - 7 x weekly - 1-2 sets - 5-10 reps - Seated Long Arc Quad  - 1 x daily - 7 x weekly - 1-2 sets - 10 reps - 2 hold - Heel Toe Raises with Counter Support  - 1 x daily - 7 x weekly - 1-2 sets - 10 reps - Standing Tandem Balance with Counter Support  - 1 x daily - 7 x weekly - 1 sets - 3-5 reps - 20-30 hold - Seated Quad Set (Mirrored)  - 1-2 x daily - 7 x weekly - 1 sets - 10 reps - 5 hold - Seated Straight Leg Heel Taps  - 1-2 x daily - 7 x weekly - 1-2 sets - 5-10 reps - Seated Hip Abduction with Resistance  - 1-2 x daily - 7 x weekly - 3 sets - 10 reps   ASSESSMENT:   CLINICAL IMPRESSION:  We continued to work to improve her gait with [facility]  and she did require 2 episodes of min A needed. We will plan to see her 2 more weeks and will work to transition her to independent program after this.   OBJECTIVE IMPAIRMENTS Abnormal gait, decreased activity tolerance, decreased balance, decreased endurance, decreased mobility, difficulty walking, decreased strength, impaired perceived functional ability, impaired flexibility, improper body mechanics, postural dysfunction, and pain.    ACTIVITY LIMITATIONS carrying, lifting, bending, standing, squatting, stairs, transfers, bed mobility, reach over head, and locomotion level   PARTICIPATION LIMITATIONS: meal prep, cleaning, interpersonal relationship, driving, shopping, and community activity   PERSONAL FACTORS   History of 10/19/21 C3-4 C4-5 C5-6 ACDF with soft collar PMH bracial neuritis, abnormal gait, Degeneration of lumbar, HTN, gouty arthropathy, OA, lumbar laminectomy 2011, Rt TKA  are also affecting patient's functional outcome.    REHAB POTENTIAL: Good   CLINICAL DECISION MAKING: Stable/uncomplicated   EVALUATION COMPLEXITY: Low     GOALS: Goals reviewed with patient? Yes   Short term PT Goals (target date for Short term goals are 4 weeks 06/23/22) Patient will demonstrate independent use of home exercise program to maintain progress from in clinic treatments. Goal status: MET   Long term PT goals (target dates for all long term goals are 4 weeks  09/23/2022 )   1. Patient will demonstrate/report pain at worst less than or equal to 2/10 to facilitate minimal limitation in daily activity secondary to pain symptoms. Goal status: MET 04/05/22   2. Patient will demonstrate independent use of home exercise program to facilitate ability to maintain/progress functional gains from skilled physical therapy services. Goal status: MET 05/24/22   3. Patient will demonstrate bilateral hip MMT 5/5 throughout to facilitate improved transfers, ambulation towards independence.  Goal status: on  going 09/14/2022   4.  Patient will demonstrate BERG testing > or = 40 to indicate reduced fall risk.  Goal status:  on going 09/14/2022    5.  Patient will demonstrate TUG c LRAD < or = 20 seconds to indicate reduced fall risk/ improved community ambulation.    Goal status:  on going 09/14/2022    6.  Patient will demonstrate ability to ambulation c LRAD (SPC, independent) community distances > 300 ft s rest  Goal status: on going 09/14/2022      PLAN: PT FREQUENCY: 1-2x/week   PT DURATION: 4 more  weeks from recert   PLANNED INTERVENTIONS: Therapeutic exercises, Therapeutic activity, Neuro Muscular re-education, Balance training, Gait training, Patient/Family education, Joint mobilization, Stair training, DME instructions, Dry Needling, Electrical stimulation, Cryotherapy, Moist heat, Taping, Ultrasound, Ionotophoresis 4mg /ml Dexamethasone, and Manual therapy.  All included unless contraindicated   PLAN FOR NEXT SESSION:  Continued strengthening in machines (no available at home) and functional movements/balance. Transition to independent program over remaining visits.   Elsie Ra, PT, DPT 09/16/22 1:18 PM

## 2022-09-20 ENCOUNTER — Ambulatory Visit (INDEPENDENT_AMBULATORY_CARE_PROVIDER_SITE_OTHER): Payer: HMO | Admitting: Physical Therapy

## 2022-09-20 ENCOUNTER — Encounter: Payer: Self-pay | Admitting: Physical Therapy

## 2022-09-20 DIAGNOSIS — R293 Abnormal posture: Secondary | ICD-10-CM | POA: Diagnosis not present

## 2022-09-20 DIAGNOSIS — G8929 Other chronic pain: Secondary | ICD-10-CM | POA: Diagnosis not present

## 2022-09-20 DIAGNOSIS — M542 Cervicalgia: Secondary | ICD-10-CM | POA: Diagnosis not present

## 2022-09-20 DIAGNOSIS — R2689 Other abnormalities of gait and mobility: Secondary | ICD-10-CM | POA: Diagnosis not present

## 2022-09-20 DIAGNOSIS — M6281 Muscle weakness (generalized): Secondary | ICD-10-CM | POA: Diagnosis not present

## 2022-09-20 DIAGNOSIS — M545 Low back pain, unspecified: Secondary | ICD-10-CM | POA: Diagnosis not present

## 2022-09-20 NOTE — Therapy (Signed)
OUTPATIENT PHYSICAL THERAPY TREATMENT NOTE    Patient Name: Claudia [name] MRN: [id number] DOB:07/06/1933, 89 y.o., female Today's Date: 09/20/2022   END OF SESSION:   PT End of Session - 09/20/22 1307     Visit Number 48    Number of Visits 50    Date for PT Re-Evaluation 09/23/22    Authorization Type [employer] $15 copay    Progress Note Due on Visit 78    Equipment Utilized During Treatment Gait belt    Activity Tolerance Patient tolerated treatment well    Behavior During Therapy WFL for tasks assessed/performed                 Past Medical History:  Diagnosis Date   Abnormality of gait    Brachial neuritis or radiculitis NOS    Degeneration of lumbar or lumbosacral intervertebral disc    Dysrhythmia    Essential hypertension, benign    Gouty arthropathy    Lumbago    Obesity    Osteoarthritis    Pure hypercholesterolemia    Unspecified hypothyroidism    Past Surgical History:  Procedure Laterality Date   ABDOMINAL HYSTERECTOMY  03/13/2010   ANTERIOR CERVICAL DECOMP/DISCECTOMY FUSION N/A 10/19/2021   Procedure: C3-4, C4-5, C5-6 ANTERIOR CERVICAL DISCECTOMY FUSION, ALLOGRAFT, PLATE;  Surgeon: [name], MD;  Location: [facility];  Service: Orthopedics;  Laterality: N/A;   BACK SURGERY     LUMBAR LAMINECTOMY  03/13/2010   right knee replacement  03/13/2010   Patient Active Problem List   Diagnosis Date Noted   Vitamin D deficiency disease 06/21/2022   Encounter for general adult medical examination with abnormal findings 06/21/2022   Vitamin B12 deficiency neuropathy ([place]) 02/15/2022   Hypercalcemia 02/15/2022   Cervical stricture or stenosis 10/20/2021   Cervical spinal stenosis 10/19/2021   Meningioma ([place]) 08/24/2021   PAF (paroxysmal atrial fibrillation) ([place]) 11/03/2020   Stage 3b chronic kidney disease ([place]) 05/06/2020   Spinal stenosis in cervical region 05/12/2016   Therapeutic opioid-induced constipation ([facility]) 01/01/2016   Chronic  idiopathic constipation 09/11/2013   Lumbosacral spondylosis without myelopathy 04/09/2013   Postlaminectomy syndrome, lumbar region 04/09/2013   Anticoagulation management encounter 02/01/2012   Neuropathy, peripheral 08/04/2011   Pure hypercholesterolemia 08/04/2011   Essential hypertension, benign 08/04/2011   Hypothyroidism 08/04/2011   DJD (degenerative joint disease) of knee 08/04/2011   Gout 08/04/2011      THERAPY DIAG:  Cervicalgia  Other abnormalities of gait and mobility  Muscle weakness (generalized)  Abnormal posture  Chronic bilateral low back pain without sciatica  PCP: Janith Lima. MD   REFERRING PROVIDER: [name], MD   REFERRING DIAG: Z98.1 (ICD-10-CM) - S/P cervical spinal fusion    Rationale for Evaluation and Treatment Rehabilitation   ONSET DATE: Dec 2022   SUBJECTIVE:  SUBJECTIVE STATEMENT: She brought in her Rollator today to try, it is a little harder for her to get in/out of car, but she feels she can walk better outside with it.     PERTINENT HISTORY:  History of 10/19/21 C3-4 C4-5 C5-6 ACDF with soft collar PMH bracial neuritis, abnormal gait, Degeneration of lumbar, HTN, gouty arthropathy, OA, lumbar laminectomy 2011, Rt TKA   PAIN:  No pain upon arrival.    PRECAUTIONS: None   WEIGHT BEARING RESTRICTIONS No   FALLS:  Has patient fallen in last 6 months? Maybe 1 fall in last 6 months.  Several falls within last year.    LIVING ENVIRONMENT: Lives with:lives alone Lives in: House/apartment Stairs: 3 steps to enter deck, rail on Lt going up.  No stairs in house.  Has following equipment at home: FWW, cane   OCCUPATION: retired    PLOF: Independent  , cooking/cleaning, shopping/grocery store   [place] stronger,  walking independently.     OBJECTIVE:    PATIENT SURVEYS:  01/25/2022 No foto - incorrect set up   COGNITION: 01/25/2022 Overall cognitive status: Within functional limits for tasks assessed    SENSATION: 01/25/2022 not tested today   POSTURE:  01/25/2022 rounded shoulders, forward head, increased thoracic kyphosis, and flexed trunk    PALPATION: 01/25/2022 no specific tenderness noted in screening for balance.      MMT: 01/25/2022:  Seated resisted static testing hip abduction Rt: strong/painless. Lt moderate strength, painless (held MMT due to difficulty in bed mobility positioning MMT Right 01/25/2022 Left 01/25/2022 Left 02/22/2022 Left 03/11/2022 Left 03/16/22 Left 04/01/2022 Left 04/05/22 Left/Right 05/24/22 Left/Right 07/21/22 Right/ Left 08/26/2022  Shoulder flexion              Shoulder extension              Shoulder abduction              Shoulder adduction              Shoulder extension              Shoulder internal rotation              Shoulder external rotation                                            Hip flexion 5/5 4/5 4/5 4/5 4/5 4+/5 4+ 4+/4+ 4+/4+ 5  /  4+  Hip extension              Hip abduction              Knee flexion 5/5 5/5        5   /   5  Knee extension  5/5 5/5    4+/5 4+ 4+/4+ 4+/4+ 5   / 4+  Ankle DF 5/5 4/5  4/5 4+/5    5/5 5  /  4+   (Blank rows = not tested)   CERVICAL SPECIAL TESTS:  01/25/2022 none performed   FUNCTIONAL TESTS:  3/12/024:    TUG with rollator 30.6 seconds  08/26/2022:   TUG with FWW: 33.1 seconds   TUG with rollator: 30.4 seconds  06/03/2022:  TUG c FWW:  34.95 seconds, 32.84 seconds 05/24/22: TUG c FWW:  36 seconds 05/06/2022: TUG c FWW:  37 seconds at end of session  04/05/22 TUG 38.5s seconds with FWW 03/22/22: TUG 44 seconds C FWW (done at very end of session) 03/08/2022:   TUG:  41.76 seconds c FWW 01/25/2022        TUG:   71 seconds c FWW     08/26/22 0001  Berg Balance Test  Sit to Stand 3  Standing  Unsupported 3  Sitting with Back Unsupported but Feet Supported on Floor or Stool 4  Stand to Sit 3  Transfers 3  Standing Unsupported with Eyes Closed 3  Standing Unsupported with Feet Together 3  From Standing, Reach Forward with Outstretched Arm 1  From Standing Position, Pick up Object from Floor 3  From Standing Position, Turn to Look Behind Over each Shoulder 2  Turn 360 Degrees 1  Standing Unsupported, Alternately Place Feet on Step/Stool 0  Standing Unsupported, One Foot in Front 2  Standing on One Leg 0  Total Score 31       06/24/22 0001  Berg Balance Test  Sit to Stand 3  Standing Unsupported 3  Sitting with Back Unsupported but Feet Supported on Floor or Stool 4  Stand to Sit 3  Transfers 3  Standing Unsupported with Eyes Closed 3  Standing Unsupported with Feet Together 3  From Standing, Reach Forward with Outstretched Arm 3  From Standing Position, Pick up Object from Floor 3  From Standing Position, Turn to Look Behind Over each Shoulder 2  Turn 360 Degrees 1  Standing Unsupported, Alternately Place Feet on Step/Stool 0  Standing Unsupported, One Foot in Front 2  Standing on One Leg 0  Total Score 33  (previously 21 on 01/25/2022    01/25/2022 BERG 21    GAIT ASSESSMENT: 09/14/2022:  Able to perform SPC c SBA household distances within clinic environment.   08/26/2022:  Mod independent c rollator : 250 ft prior to fatigue.   2 min walk test 125 ft c rollator  08/19/2022:  Mod independent c FWW and rollator household distances in clinic.   07/09/2022:  Ambulation mod independent c FWW with marked reduced gait speed, limited in distance due to fatigue (household distances).  Able to perform ambulation c SPC c CGA to occasional SBA within clinic distances < 100 ft.   03/18/2022:  FWW into clinic c mod independent, decreased gait speed.  SPC use in Rt UE c CGA 33 ft today c cues for sequencing.    TODAY'S TREATMENT:  09/20/2022 Therex: Recumbent bike 10 mins  lvl 3-2, seat 7 Leg press double leg 81 lbs x 20, single leg 31 lbs 2 x 15 bilateral  Leg extension machine Double leg, 10# 3X10 Hamstring curl machine DL 25# 2X10   TherActivity Forward step up in // bars with bilat rail assist and CGA x 10 bilateral 6 inch step Step taps alternating on 6 inch step X 10 bilat with 1-2 UE support PRN Sidestepping in bars 3 round trips with UE support Ambulation with rollator out to car and worked on trying to fold up her rollator and get into trunk of her car without assistance but unable to do this.   09/16/2022 Therex: Recumbent bike 10 mins lvl 2, seat 7 Leg press double leg 81 lbs x 20, single leg 31 lbs 2 x 15 bilateral  Leg extension machine Double leg, 10# 3X10   TherActivity SPC use in clinic household distances < 100 ft between locations in clinic c SBA.  Performed to improve endurance and safety with SPC Forward step up  in // bars with Rt hand rail assist and CGA x 10 bilateral 4 inch step 6 inch hurdle step over and back in // bars with bilateral hand rail assist c CGA x 10 bilateral   09/14/2022 Therex: Recumbent bike 10 mins lvl 2, seat 7 Leg press double leg 81 lbs x 20, single leg 31 lbs 2 x 15 bilateral    TherActivity SPC use in clinic household distances < 100 ft between locations in clinic c SBA.  Performed to improve endurance and safety with SPC Forward step up in // bars with Rt hand rail assist and CGA x 10 bilateral 4 inch step BIG inspired forward stepping c Rt hand on bar (simulated cane use) x 10 bilateral c CGA to improve step length  09/07/2022 Therex: Recumbent bike 12 mins lvl 2, seat 7 Forward step up in // bars with bilateral hand rail assist and CGA x 10 bilateral 4 inch step 6 inch hurdle step over and back in // bars with bilateral hand rail assist c CGA x 10 bilateral Leg press double leg 81 lbs x 20, single leg 31 lbs 2 x 15 bilateral  TUG x 1 c rollator    08/30/2022 Therex: Recumbent bike 10 mins lvl  1, seat 7 Seated green band around knees clamshell with isometric hold opposite leg x 20 bilateral Seated marching green band around knees x 15 bilateral Seated LAQ 2-3 sec hold end range x 15 bilateral Sit to stand to sit with focus on slow lowering and reduced hand assist as possible x 5  18 inch chair c airex pad in it   Spent time c cues verbal, visual on HEP review and handout provided.   Neuro Re-ed Corner standing:  Alt heel/toe raises x 20 with bilateral hand assist on bars (cues for use Rt hand only at times) - cues for home use  Modified tandem stance 30 sec x 2 bilateral - cues for home use   08/26/2022: Therex: Recumbent bike Lvl 1 10 mins seat 8 Sit to stand to sit x 10 18 inch chair c hand assist bilateral as necessary  Physical Performance Testing: Extended time for education, cues and performance of testing listed above including BERG balance testing, TUG with rollator and with FWW, 2 min walk test/distance walk testing.     08/23/2022 Therex: Recumbent bike lvl 3-1 x10 min, seat 8  Leg press double leg 81# 2x15, single leg 31# x20 bilateral Leg extension machine Double leg up, single leg lowering 3 x 10 5 lbs  Leg curl machine double leg 20 lbs 3x10  TherActivity:   -Rollator ramp up/down with CGA  -Rollator up/down 4 inch step c CGA at times c cues for rollator positioning   - Rollator up ramp and down 2 steps with CGA    PATIENT EDUCATION:  08/30/2022 Education details: HEP update Person educated: Patient Education method: Consulting civil engineer, Demonstration, Verbal cues, and Handouts Education comprehension: verbalized understanding, returned demonstration, and verbal cues required     HOME EXERCISE PROGRAM: Access Code: [email or web address] URL: [email or web address] Date: 08/30/2022 Prepared by: Scot Jun  Exercises - Seated March  - 1 x daily - 7 x weekly - 1-2 sets - 10 reps - Sit to Stand  - 1 x daily - 7 x weekly - 1-2 sets - 5-10 reps -  Seated Long Arc Quad  - 1 x daily - 7 x weekly - 1-2 sets - 10 reps - 2 hold - Heel Toe Raises with Counter  Support  - 1 x daily - 7 x weekly - 1-2 sets - 10 reps - Standing Tandem Balance with Counter Support  - 1 x daily - 7 x weekly - 1 sets - 3-5 reps - 20-30 hold - Seated Quad Set (Mirrored)  - 1-2 x daily - 7 x weekly - 1 sets - 10 reps - 5 hold - Seated Straight Leg Heel Taps  - 1-2 x daily - 7 x weekly - 1-2 sets - 5-10 reps - Seated Hip Abduction with Resistance  - 1-2 x daily - 7 x weekly - 3 sets - 10 reps   ASSESSMENT:   CLINICAL IMPRESSION:  She brought in her rollator and this allow her to improve her gait speed and allows a place to stop and sit when needed. The complication however is she can not get this in/out of her car without assistance.   OBJECTIVE IMPAIRMENTS Abnormal gait, decreased activity tolerance, decreased balance, decreased endurance, decreased mobility, difficulty walking, decreased strength, impaired perceived functional ability, impaired flexibility, improper body mechanics, postural dysfunction, and pain.    ACTIVITY LIMITATIONS carrying, lifting, bending, standing, squatting, stairs, transfers, bed mobility, reach over head, and locomotion level   PARTICIPATION LIMITATIONS: meal prep, cleaning, interpersonal relationship, driving, shopping, and community activity   PERSONAL FACTORS   History of 10/19/21 C3-4 C4-5 C5-6 ACDF with soft collar PMH bracial neuritis, abnormal gait, Degeneration of lumbar, HTN, gouty arthropathy, OA, lumbar laminectomy 2011, Rt TKA  are also affecting patient's functional outcome.    REHAB POTENTIAL: Good   CLINICAL DECISION MAKING: Stable/uncomplicated   EVALUATION COMPLEXITY: Low     GOALS: Goals reviewed with patient? Yes   Short term PT Goals (target date for Short term goals are 4 weeks 06/23/22) Patient will demonstrate independent use of home exercise program to maintain progress from in clinic treatments. Goal  status: MET   Long term PT goals (target dates for all long term goals are 4 weeks  09/23/2022 )   1. Patient will demonstrate/report pain at worst less than or equal to 2/10 to facilitate minimal limitation in daily activity secondary to pain symptoms. Goal status: MET 04/05/22   2. Patient will demonstrate independent use of home exercise program to facilitate ability to maintain/progress functional gains from skilled physical therapy services. Goal status: MET 05/24/22   3. Patient will demonstrate bilateral hip MMT 5/5 throughout to facilitate improved transfers, ambulation towards independence.  Goal status: on going 09/14/2022   4.  Patient will demonstrate BERG testing > or = 40 to indicate reduced fall risk.  Goal status:  on going 09/14/2022    5.  Patient will demonstrate TUG c LRAD < or = 20 seconds to indicate reduced fall risk/ improved community ambulation.    Goal status:  on going 09/14/2022    6.  Patient will demonstrate ability to ambulation c LRAD (SPC, independent) community distances > 300 ft s rest  Goal status: on going 09/14/2022      PLAN: PT FREQUENCY: 1-2x/week   PT DURATION: 4 more  weeks from recert   PLANNED INTERVENTIONS: Therapeutic exercises, Therapeutic activity, Neuro Muscular re-education, Balance training, Gait training, Patient/Family education, Joint mobilization, Stair training, DME instructions, Dry Needling, Electrical stimulation, Cryotherapy, Moist heat, Taping, Ultrasound, Ionotophoresis 4mg /ml Dexamethasone, and Manual therapy.  All included unless contraindicated   PLAN FOR NEXT SESSION:  Continued strengthening in machines (no available at home) and functional movements/balance. Transition to independent program over remaining visits.   Elsie Ra,  PT, DPT 09/20/22 1:18 PM

## 2022-09-23 ENCOUNTER — Encounter: Payer: Self-pay | Admitting: Physical Therapy

## 2022-09-23 ENCOUNTER — Ambulatory Visit (INDEPENDENT_AMBULATORY_CARE_PROVIDER_SITE_OTHER): Payer: HMO | Admitting: Physical Therapy

## 2022-09-23 DIAGNOSIS — R293 Abnormal posture: Secondary | ICD-10-CM

## 2022-09-23 DIAGNOSIS — M6281 Muscle weakness (generalized): Secondary | ICD-10-CM

## 2022-09-23 DIAGNOSIS — M542 Cervicalgia: Secondary | ICD-10-CM | POA: Diagnosis not present

## 2022-09-23 DIAGNOSIS — R2689 Other abnormalities of gait and mobility: Secondary | ICD-10-CM | POA: Diagnosis not present

## 2022-09-23 DIAGNOSIS — G8929 Other chronic pain: Secondary | ICD-10-CM

## 2022-09-23 DIAGNOSIS — M545 Low back pain, unspecified: Secondary | ICD-10-CM | POA: Diagnosis not present

## 2022-09-23 DIAGNOSIS — M47817 Spondylosis without myelopathy or radiculopathy, lumbosacral region: Secondary | ICD-10-CM | POA: Diagnosis not present

## 2022-09-23 NOTE — Therapy (Signed)
OUTPATIENT PHYSICAL THERAPY TREATMENT NOTE    Patient Name: Claudia [name] MRN: [id number] DOB:1933-12-17, 89 y.o., female Today's Date: 09/23/2022   END OF SESSION:   PT End of Session - 09/23/22 1309     Visit Number 53    Number of Visits 50    Date for PT Re-Evaluation 09/23/22    Authorization Type [employer] $15 copay    Progress Note Due on Visit 40    PT Start Time 1303    PT Stop Time 1345    PT Time Calculation (min) 42 min    Equipment Utilized During Treatment Gait belt    Activity Tolerance Patient tolerated treatment well    Behavior During Therapy WFL for tasks assessed/performed                 Past Medical History:  Diagnosis Date   Abnormality of gait    Brachial neuritis or radiculitis NOS    Degeneration of lumbar or lumbosacral intervertebral disc    Dysrhythmia    Essential hypertension, benign    Gouty arthropathy    Lumbago    Obesity    Osteoarthritis    Pure hypercholesterolemia    Unspecified hypothyroidism    Past Surgical History:  Procedure Laterality Date   ABDOMINAL HYSTERECTOMY  03/13/2010   ANTERIOR CERVICAL DECOMP/DISCECTOMY FUSION N/A 10/19/2021   Procedure: C3-4, C4-5, C5-6 ANTERIOR CERVICAL DISCECTOMY FUSION, ALLOGRAFT, PLATE;  Surgeon: [name], MD;  Location: [facility];  Service: Orthopedics;  Laterality: N/A;   BACK SURGERY     LUMBAR LAMINECTOMY  03/13/2010   right knee replacement  03/13/2010   Patient Active Problem List   Diagnosis Date Noted   Vitamin D deficiency disease 06/21/2022   Encounter for general adult medical examination with abnormal findings 06/21/2022   Vitamin B12 deficiency neuropathy ([place]) 02/15/2022   Hypercalcemia 02/15/2022   Cervical stricture or stenosis 10/20/2021   Cervical spinal stenosis 10/19/2021   Meningioma ([place]) 08/24/2021   PAF (paroxysmal atrial fibrillation) ([place]) 11/03/2020   Stage 3b chronic kidney disease ([place]) 05/06/2020   Spinal stenosis in cervical region  05/12/2016   Therapeutic opioid-induced constipation (OIC) 01/01/2016   Chronic idiopathic constipation 09/11/2013   Lumbosacral spondylosis without myelopathy 04/09/2013   Postlaminectomy syndrome, lumbar region 04/09/2013   Anticoagulation management encounter 02/01/2012   Neuropathy, peripheral 08/04/2011   Pure hypercholesterolemia 08/04/2011   Essential hypertension, benign 08/04/2011   Hypothyroidism 08/04/2011   DJD (degenerative joint disease) of knee 08/04/2011   Gout 08/04/2011      THERAPY DIAG:  Cervicalgia  Other abnormalities of gait and mobility  Muscle weakness (generalized)  Abnormal posture  Chronic bilateral low back pain without sciatica  PCP: Janith Lima. MD   REFERRING PROVIDER: [name], MD   REFERRING DIAG: Z98.1 (ICD-10-CM) - S/P cervical spinal fusion    Rationale for Evaluation and Treatment Rehabilitation   ONSET DATE: Dec 2022   SUBJECTIVE:  SUBJECTIVE STATEMENT: She is using 2 wheeled RW today as she can not get the rollator in/out of her car by herself.     PERTINENT HISTORY:  History of 10/19/21 C3-4 C4-5 C5-6 ACDF with soft collar PMH bracial neuritis, abnormal gait, Degeneration of lumbar, HTN, gouty arthropathy, OA, lumbar laminectomy 2011, Rt TKA   PAIN:  No pain upon arrival.    PRECAUTIONS: None   WEIGHT BEARING RESTRICTIONS No   FALLS:  Has patient fallen in last 6 months? Maybe 1 fall in last 6 months.  Several falls within last year.    LIVING ENVIRONMENT: Lives with:lives alone Lives in: House/apartment Stairs: 3 steps to enter deck, rail on Lt going up.  No stairs in house.  Has following equipment at home: FWW, cane   OCCUPATION: retired    PLOF: Independent  , cooking/cleaning, shopping/grocery store    [place] stronger, walking independently.     OBJECTIVE:    PATIENT SURVEYS:  01/25/2022 No foto - incorrect set up   COGNITION: 01/25/2022 Overall cognitive status: Within functional limits for tasks assessed    SENSATION: 01/25/2022 not tested today   POSTURE:  01/25/2022 rounded shoulders, forward head, increased thoracic kyphosis, and flexed trunk    PALPATION: 01/25/2022 no specific tenderness noted in screening for balance.      MMT: 01/25/2022:  Seated resisted static testing hip abduction Rt: strong/painless. Lt moderate strength, painless (held MMT due to difficulty in bed mobility positioning MMT Right 01/25/2022 Left 01/25/2022 Left 02/22/2022 Left 03/11/2022 Left 03/16/22 Left 04/01/2022 Left 04/05/22 Left/Right 05/24/22 Left/Right 07/21/22 Right/ Left 08/26/2022  Shoulder flexion              Shoulder extension              Shoulder abduction              Shoulder adduction              Shoulder extension              Shoulder internal rotation              Shoulder external rotation                                            Hip flexion 5/5 4/5 4/5 4/5 4/5 4+/5 4+ 4+/4+ 4+/4+ 5  /  4+  Hip extension              Hip abduction              Knee flexion 5/5 5/5        5   /   5  Knee extension  5/5 5/5    4+/5 4+ 4+/4+ 4+/4+ 5   / 4+  Ankle DF 5/5 4/5  4/5 4+/5    5/5 5  /  4+   (Blank rows = not tested)   CERVICAL SPECIAL TESTS:  01/25/2022 none performed   FUNCTIONAL TESTS:  3/12/024:    TUG with rollator 30.6 seconds  08/26/2022:   TUG with FWW: 33.1 seconds   TUG with rollator: 30.4 seconds  06/03/2022:  TUG c FWW:  34.95 seconds, 32.84 seconds 05/24/22: TUG c FWW:  36 seconds 05/06/2022: TUG c FWW:  37 seconds at end of session 04/05/22 TUG 38.5s seconds with FWW 03/22/22: TUG 44 seconds  C FWW (done at very end of session) 03/08/2022:   TUG:  41.76 seconds c FWW 01/25/2022        TUG:   71 seconds c FWW     08/26/22 0001  Berg Balance Test   Sit to Stand 3  Standing Unsupported 3  Sitting with Back Unsupported but Feet Supported on Floor or Stool 4  Stand to Sit 3  Transfers 3  Standing Unsupported with Eyes Closed 3  Standing Unsupported with Feet Together 3  From Standing, Reach Forward with Outstretched Arm 1  From Standing Position, Pick up Object from Floor 3  From Standing Position, Turn to Look Behind Over each Shoulder 2  Turn 360 Degrees 1  Standing Unsupported, Alternately Place Feet on Step/Stool 0  Standing Unsupported, One Foot in Front 2  Standing on One Leg 0  Total Score 31       06/24/22 0001  Berg Balance Test  Sit to Stand 3  Standing Unsupported 3  Sitting with Back Unsupported but Feet Supported on Floor or Stool 4  Stand to Sit 3  Transfers 3  Standing Unsupported with Eyes Closed 3  Standing Unsupported with Feet Together 3  From Standing, Reach Forward with Outstretched Arm 3  From Standing Position, Pick up Object from Floor 3  From Standing Position, Turn to Look Behind Over each Shoulder 2  Turn 360 Degrees 1  Standing Unsupported, Alternately Place Feet on Step/Stool 0  Standing Unsupported, One Foot in Front 2  Standing on One Leg 0  Total Score 33  (previously 21 on 01/25/2022    01/25/2022 BERG 21    GAIT ASSESSMENT: 09/14/2022:  Able to perform SPC c SBA household distances within clinic environment.   08/26/2022:  Mod independent c rollator : 250 ft prior to fatigue.   2 min walk test 125 ft c rollator  08/19/2022:  Mod independent c FWW and rollator household distances in clinic.   07/09/2022:  Ambulation mod independent c FWW with marked reduced gait speed, limited in distance due to fatigue (household distances).  Able to perform ambulation c SPC c CGA to occasional SBA within clinic distances < 100 ft.   03/18/2022:  FWW into clinic c mod independent, decreased gait speed.  SPC use in Rt UE c CGA 33 ft today c cues for sequencing.    TODAY'S TREATMENT:     09/23/2022 Therex: Recumbent bike 10 mins lvl 3-2, seat 7 Leg press double leg 81 lbs x 20, single leg 31 lbs 2 x 15 bilateral  Leg extension machine Double leg, 10# 3X10    TherActivity  Step taps alternating on 4 inch step X 10 bilat with 2 UE support at sink Sidestepping 3 round trips at counter top, progressing Sit to stands at sink 5 reps with UE support, slow eccentrics Walking with cane SBA around clinic 25 feet, 50 feet, 75 feet  09/20/2022 Therex: Recumbent bike 10 mins lvl 3-2, seat 7 Leg press double leg 81 lbs x 20, single leg 31 lbs 2 x 15 bilateral  Leg extension machine Double leg, 10# 3X10 Hamstring curl machine DL 25# 2X10   TherActivity Forward step up in // bars with bilat rail assist and CGA x 10 bilateral 6 inch step Step taps alternating on 6 inch step X 10 bilat with 1-2 UE support PRN Sidestepping in bars 3 round trips with UE support Ambulation with rollator out to car and worked on trying to fold up her rollator  and get into trunk of her car without assistance but unable to do this.   09/16/2022 Therex: Recumbent bike 10 mins lvl 2, seat 7 Leg press double leg 81 lbs x 20, single leg 31 lbs 2 x 15 bilateral  Leg extension machine Double leg, 10# 3X10   TherActivity SPC use in clinic household distances < 100 ft between locations in clinic c SBA.  Performed to improve endurance and safety with SPC Forward step up in // bars with Rt hand rail assist and CGA x 10 bilateral 4 inch step 6 inch hurdle step over and back in // bars with bilateral hand rail assist c CGA x 10 bilateral    PATIENT EDUCATION:  08/30/2022 Education details: HEP update Person educated: Patient Education method: Consulting civil engineer, Demonstration, Verbal cues, and Handouts Education comprehension: verbalized understanding, returned demonstration, and verbal cues required     HOME EXERCISE PROGRAM: Access Code: [email or web address] URL: [email or web address] Date:  08/30/2022 Prepared by: Scot Jun  Exercises - Seated March  - 1 x daily - 7 x weekly - 1-2 sets - 10 reps - Sit to Stand  - 1 x daily - 7 x weekly - 1-2 sets - 5-10 reps - Seated Long Arc Quad  - 1 x daily - 7 x weekly - 1-2 sets - 10 reps - 2 hold - Heel Toe Raises with Counter Support  - 1 x daily - 7 x weekly - 1-2 sets - 10 reps - Standing Tandem Balance with Counter Support  - 1 x daily - 7 x weekly - 1 sets - 3-5 reps - 20-30 hold - Seated Quad Set (Mirrored)  - 1-2 x daily - 7 x weekly - 1 sets - 10 reps - 5 hold - Seated Straight Leg Heel Taps  - 1-2 x daily - 7 x weekly - 1-2 sets - 5-10 reps - Seated Hip Abduction with Resistance  - 1-2 x daily - 7 x weekly - 3 sets - 10 reps   ASSESSMENT:   CLINICAL IMPRESSION:  I showed her some more exercises she can do at home as we transition to independent program and plan to finish up next week. We will condense her HEP printouts to the main exercises to continue with.   OBJECTIVE IMPAIRMENTS Abnormal gait, decreased activity tolerance, decreased balance, decreased endurance, decreased mobility, difficulty walking, decreased strength, impaired perceived functional ability, impaired flexibility, improper body mechanics, postural dysfunction, and pain.    ACTIVITY LIMITATIONS carrying, lifting, bending, standing, squatting, stairs, transfers, bed mobility, reach over head, and locomotion level   PARTICIPATION LIMITATIONS: meal prep, cleaning, interpersonal relationship, driving, shopping, and community activity   PERSONAL FACTORS   History of 10/19/21 C3-4 C4-5 C5-6 ACDF with soft collar PMH bracial neuritis, abnormal gait, Degeneration of lumbar, HTN, gouty arthropathy, OA, lumbar laminectomy 2011, Rt TKA  are also affecting patient's functional outcome.    REHAB POTENTIAL: Good   CLINICAL DECISION MAKING: Stable/uncomplicated   EVALUATION COMPLEXITY: Low     GOALS: Goals reviewed with patient? Yes   Short term PT Goals  (target date for Short term goals are 4 weeks 06/23/22) Patient will demonstrate independent use of home exercise program to maintain progress from in clinic treatments. Goal status: MET   Long term PT goals (target dates for all long term goals are 4 weeks  09/23/2022 )   1. Patient will demonstrate/report pain at worst less than or equal to 2/10 to facilitate minimal limitation in daily activity secondary to pain symptoms.  Goal status: MET 04/05/22   2. Patient will demonstrate independent use of home exercise program to facilitate ability to maintain/progress functional gains from skilled physical therapy services. Goal status: MET 05/24/22   3. Patient will demonstrate bilateral hip MMT 5/5 throughout to facilitate improved transfers, ambulation towards independence.  Goal status: on going 09/14/2022   4.  Patient will demonstrate BERG testing > or = 40 to indicate reduced fall risk.  Goal status:  on going 09/14/2022    5.  Patient will demonstrate TUG c LRAD < or = 20 seconds to indicate reduced fall risk/ improved community ambulation.    Goal status:  on going 09/14/2022    6.  Patient will demonstrate ability to ambulation c LRAD (SPC, independent) community distances > 300 ft s rest  Goal status: on going 09/14/2022      PLAN: PT FREQUENCY: 1-2x/week   PT DURATION: 4 more  weeks from recert   PLANNED INTERVENTIONS: Therapeutic exercises, Therapeutic activity, Neuro Muscular re-education, Balance training, Gait training, Patient/Family education, Joint mobilization, Stair training, DME instructions, Dry Needling, Electrical stimulation, Cryotherapy, Moist heat, Taping, Ultrasound, Ionotophoresis 4mg /ml Dexamethasone, and Manual therapy.  All included unless contraindicated   PLAN FOR NEXT SESSION:  update HEP and review to Transition to independent program over her last 2 visits.   Elsie Ra, PT, DPT 09/23/22 1:10 PM

## 2022-09-24 ENCOUNTER — Other Ambulatory Visit: Payer: Self-pay | Admitting: Internal Medicine

## 2022-09-24 DIAGNOSIS — M47817 Spondylosis without myelopathy or radiculopathy, lumbosacral region: Secondary | ICD-10-CM

## 2022-09-24 DIAGNOSIS — M170 Bilateral primary osteoarthritis of knee: Secondary | ICD-10-CM

## 2022-09-24 DIAGNOSIS — M961 Postlaminectomy syndrome, not elsewhere classified: Secondary | ICD-10-CM

## 2022-09-27 ENCOUNTER — Ambulatory Visit (INDEPENDENT_AMBULATORY_CARE_PROVIDER_SITE_OTHER): Payer: HMO | Admitting: Orthopaedic Surgery

## 2022-09-27 ENCOUNTER — Encounter: Payer: Self-pay | Admitting: Orthopaedic Surgery

## 2022-09-27 ENCOUNTER — Other Ambulatory Visit: Payer: Self-pay

## 2022-09-27 ENCOUNTER — Encounter: Payer: Self-pay | Admitting: Physical Therapy

## 2022-09-27 ENCOUNTER — Ambulatory Visit (INDEPENDENT_AMBULATORY_CARE_PROVIDER_SITE_OTHER): Payer: HMO | Admitting: Physical Therapy

## 2022-09-27 VITALS — BP 132/70 | HR 59 | Ht 69.0 in | Wt 158.0 lb

## 2022-09-27 DIAGNOSIS — G8929 Other chronic pain: Secondary | ICD-10-CM

## 2022-09-27 DIAGNOSIS — R293 Abnormal posture: Secondary | ICD-10-CM

## 2022-09-27 DIAGNOSIS — M542 Cervicalgia: Secondary | ICD-10-CM

## 2022-09-27 DIAGNOSIS — R2689 Other abnormalities of gait and mobility: Secondary | ICD-10-CM

## 2022-09-27 DIAGNOSIS — M6281 Muscle weakness (generalized): Secondary | ICD-10-CM | POA: Diagnosis not present

## 2022-09-27 DIAGNOSIS — M545 Low back pain, unspecified: Secondary | ICD-10-CM

## 2022-09-27 DIAGNOSIS — Z981 Arthrodesis status: Secondary | ICD-10-CM

## 2022-09-27 NOTE — Progress Notes (Signed)
Office Visit Note   Patient: Claudia [name]           Date of Birth: Sep 01, 1933           MRN: [id number] Visit Date: 09/27/2022              Requested by: Janith Lima, MD [address] [place],  [place] [address] PCP: Janith Lima, MD   Assessment & Plan: Visit Diagnoses:  1. S/P cervical spinal fusion     Plan: Continue walking and strengthening.  She is going to the store with a neighbor now.  Continue use of walker for fall prevention.  Return in 6 months lateral flexion-extension C-spine x-ray on return.  She is happy with the improvement with mobility compared to preop.  Follow-Up Instructions: No follow-ups on file.   Orders:  Orders Placed This Encounter  Procedures   XR Cervical Spine 2 or 3 views   No orders of the defined types were placed in this encounter.     Procedures: No procedures performed   Clinical Data: No additional findings.   Subjective: Chief Complaint  Patient presents with   Neck - Follow-up    10/19/2021 C3-4, C4-5, C5-6 ACDF    HPI 89 year old female now 1 year post three-level cervical fusion C3-4, C4-5, C5-6 with stenosis and cord myelopathy.  She is finishing her last therapy visit and is using a walker for ambulation.  She is slowly getting stronger.  Used to be on hydrocodone chronically for several years for her back is weaning down and only occasionally taking the Norco.  She has had gradual progression in her strength.  Still has problems when she puts a lot of pressure on her walker with her left shoulder pain but she can get her arm up overhead.  She has made slow gradual improvement with her gait.  Additionally she has history of Previous lumbar surgery 2011.  Review of Systems updated unchanged   Objective: Vital Signs: BP 132/70   Pulse (!) 59   Ht 5\' 9"  (1.753 m)   Wt 158 lb (71.7 kg)   BMI 23.33 kg/m   Physical Exam Constitutional:      Appearance: She is well-developed.  HENT:     Head:  Normocephalic.     Right Ear: External ear normal.     Left Ear: External ear normal. There is no impacted cerumen.  Eyes:     Pupils: Pupils are equal, round, and reactive to light.  Neck:     Thyroid: No thyromegaly.     Trachea: No tracheal deviation.  Cardiovascular:     Rate and Rhythm: Normal rate.  Pulmonary:     Effort: Pulmonary effort is normal.  Abdominal:     Palpations: Abdomen is soft.  Musculoskeletal:     Cervical back: No rigidity.  Skin:    General: Skin is warm and dry.  Neurological:     Mental Status: She is alert and oriented to person, place, and time.  Psychiatric:        Behavior: Behavior normal.     Ortho Exam more trouble moving the left leg than right leg.  Still weakness with hip flexion on the left right side is normal.  Left knee extension ankle dorsiflexion remains one half grade down.  Specialty Comments:  No specialty comments available.  Imaging: No results found.   PMFS History: Patient Active Problem List   Diagnosis Date Noted   Vitamin D deficiency disease  06/21/2022   Encounter for general adult medical examination with abnormal findings 06/21/2022   Vitamin B12 deficiency neuropathy 02/15/2022   Hypercalcemia 02/15/2022   Cervical stricture or stenosis 10/20/2021   Cervical spinal stenosis 10/19/2021   Meningioma 08/24/2021   PAF (paroxysmal atrial fibrillation) 11/03/2020   Stage 3b chronic kidney disease 05/06/2020   Spinal stenosis in cervical region 05/12/2016   Therapeutic opioid-induced constipation (OIC) 01/01/2016   Chronic idiopathic constipation 09/11/2013   Lumbosacral spondylosis without myelopathy 04/09/2013   Postlaminectomy syndrome, lumbar region 04/09/2013   Anticoagulation management encounter 02/01/2012   Neuropathy, peripheral 08/04/2011   Pure hypercholesterolemia 08/04/2011   Essential hypertension, benign 08/04/2011   Hypothyroidism 08/04/2011   DJD (degenerative joint disease) of knee 08/04/2011    Gout 08/04/2011   Past Medical History:  Diagnosis Date   Abnormality of gait    Brachial neuritis or radiculitis NOS    Degeneration of lumbar or lumbosacral intervertebral disc    Dysrhythmia    Essential hypertension, benign    Gouty arthropathy    Lumbago    Obesity    Osteoarthritis    Pure hypercholesterolemia    Unspecified hypothyroidism     Family History  Problem Relation Age of Onset   Heart disease Father    Heart attack Father    Hypertension Mother    Alzheimer's disease Mother    Diabetes Sister    Cirrhosis Brother    Diabetes Sister    Diabetes Sister    Diabetes Brother    Heart attack Brother    Cancer Neg Hx    Kidney disease Neg Hx     Past Surgical History:  Procedure Laterality Date   ABDOMINAL HYSTERECTOMY  03/13/2010   ANTERIOR CERVICAL DECOMP/DISCECTOMY FUSION N/A 10/19/2021   Procedure: C3-4, C4-5, C5-6 ANTERIOR CERVICAL DISCECTOMY FUSION, ALLOGRAFT, PLATE;  Surgeon: [name], MD;  Location: [facility];  Service: Orthopedics;  Laterality: N/A;   BACK SURGERY     LUMBAR LAMINECTOMY  03/13/2010   right knee replacement  03/13/2010   Social History   Occupational History   Not on file  Tobacco Use   Smoking status: Never   Smokeless tobacco: Never  Vaping Use   Vaping Use: Never used  Substance and Sexual Activity   Alcohol use: Yes    Comment: 1 glass wine/month   Drug use: No   Sexual activity: Never

## 2022-09-27 NOTE — Therapy (Signed)
OUTPATIENT PHYSICAL THERAPY TREATMENT NOTE/RECERT     Patient Name: Claudia [name] MRN: [id number] DOB:[birth year], 89 y.o., female Today's Date: 09/27/2022   END OF SESSION:   PT End of Session - 09/27/22 1309     Visit Number 50    Number of Visits 51    Date for PT Re-Evaluation 10/01/22    Authorization Type [employer] $15 copay    Progress Note Due on Visit 64    PT Start Time 1300    PT Stop Time 1344    PT Time Calculation (min) 44 min    Equipment Utilized During Treatment Gait belt    Activity Tolerance Patient tolerated treatment well    Behavior During Therapy WFL for tasks assessed/performed                  Past Medical History:  Diagnosis Date   Abnormality of gait    Brachial neuritis or radiculitis NOS    Degeneration of lumbar or lumbosacral intervertebral disc    Dysrhythmia    Essential hypertension, benign    Gouty arthropathy    Lumbago    Obesity    Osteoarthritis    Pure hypercholesterolemia    Unspecified hypothyroidism    Past Surgical History:  Procedure Laterality Date   ABDOMINAL HYSTERECTOMY  03/13/2010   ANTERIOR CERVICAL DECOMP/DISCECTOMY FUSION N/A 10/19/2021   Procedure: C3-4, C4-5, C5-6 ANTERIOR CERVICAL DISCECTOMY FUSION, ALLOGRAFT, PLATE;  Surgeon: [name], MD;  Location: [facility];  Service: Orthopedics;  Laterality: N/A;   BACK SURGERY     LUMBAR LAMINECTOMY  03/13/2010   right knee replacement  03/13/2010   Patient Active Problem List   Diagnosis Date Noted   Vitamin D deficiency disease 06/21/2022   Encounter for general adult medical examination with abnormal findings 06/21/2022   Vitamin B12 deficiency neuropathy 02/15/2022   Hypercalcemia 02/15/2022   Cervical stricture or stenosis 10/20/2021   Cervical spinal stenosis 10/19/2021   Meningioma 08/24/2021   PAF (paroxysmal atrial fibrillation) 11/03/2020   Stage 3b chronic kidney disease 05/06/2020   Spinal stenosis in cervical region 05/12/2016    Therapeutic opioid-induced constipation (OIC) 01/01/2016   Chronic idiopathic constipation 09/11/2013   Lumbosacral spondylosis without myelopathy 04/09/2013   Postlaminectomy syndrome, lumbar region 04/09/2013   Anticoagulation management encounter 02/01/2012   Neuropathy, peripheral 08/04/2011   Pure hypercholesterolemia 08/04/2011   Essential hypertension, benign 08/04/2011   Hypothyroidism 08/04/2011   DJD (degenerative joint disease) of knee 08/04/2011   Gout 08/04/2011      THERAPY DIAG:  Cervicalgia  Other abnormalities of gait and mobility  Muscle weakness (generalized)  Chronic bilateral low back pain without sciatica  Abnormal posture  PCP: Janith Lima. MD   REFERRING PROVIDER: [name], MD   REFERRING DIAG: Z98.1 (ICD-10-CM) - S/P cervical spinal fusion    Rationale for Evaluation and Treatment Rehabilitation   ONSET DATE: Dec 2022   SUBJECTIVE:  SUBJECTIVE STATEMENT: She still reports difficulty with her legs more on her left. She is not having pain. She agrees to transition to independent program after next visit.     PERTINENT HISTORY:  History of 10/19/21 C3-4 C4-5 C5-6 ACDF with soft collar PMH bracial neuritis, abnormal gait, Degeneration of lumbar, HTN, gouty arthropathy, OA, lumbar laminectomy 2011, Rt TKA   PAIN:  No pain upon arrival.    PRECAUTIONS: None   WEIGHT BEARING RESTRICTIONS No   FALLS:  Has patient fallen in last 6 months? Maybe 1 fall in last 6 months.  Several falls within last year.    LIVING ENVIRONMENT: Lives with:lives alone Lives in: House/apartment Stairs: 3 steps to enter deck, rail on Lt going up.  No stairs in house.  Has following equipment at home: FWW, cane   OCCUPATION: retired    PLOF: Independent  ,  cooking/cleaning, shopping/grocery store   [place] stronger, walking independently.     OBJECTIVE:    PATIENT SURVEYS:  01/25/2022 No foto - incorrect set up   COGNITION: 01/25/2022 Overall cognitive status: Within functional limits for tasks assessed    SENSATION: 01/25/2022 not tested today   POSTURE:  01/25/2022 rounded shoulders, forward head, increased thoracic kyphosis, and flexed trunk    PALPATION: 01/25/2022 no specific tenderness noted in screening for balance.      MMT: 01/25/2022:  Seated resisted static testing hip abduction Rt: strong/painless. Lt moderate strength, painless (held MMT due to difficulty in bed mobility positioning MMT Right 01/25/2022 Left 01/25/2022 Left 02/22/2022 Left 03/11/2022 Left 03/16/22 Left 04/01/2022 Left 04/05/22 Left/Right 05/24/22 Left/Right 07/21/22 Right/ Left 08/26/2022  Shoulder flexion              Shoulder extension              Shoulder abduction              Shoulder adduction              Shoulder extension              Shoulder internal rotation              Shoulder external rotation                                            Hip flexion 5/5 4/5 4/5 4/5 4/5 4+/5 4+ 4+/4+ 4+/4+ 5  /  4+  Hip extension              Hip abduction              Knee flexion 5/5 5/5        5   /   5  Knee extension  5/5 5/5    4+/5 4+ 4+/4+ 4+/4+ 5   / 4+  Ankle DF 5/5 4/5  4/5 4+/5    5/5 5  /  4+   (Blank rows = not tested)   CERVICAL SPECIAL TESTS:  01/25/2022 none performed   FUNCTIONAL TESTS:  3/12/024:    TUG with rollator 30.6 seconds  08/26/2022:   TUG with FWW: 33.1 seconds   TUG with rollator: 30.4 seconds  06/03/2022:  TUG c FWW:  34.95 seconds, 32.84 seconds 05/24/22: TUG c FWW:  36 seconds 05/06/2022: TUG c FWW:  37 seconds at end of session 04/05/22 TUG 38.5s seconds  with FWW 03/22/22: TUG 44 seconds C FWW (done at very end of session) 03/08/2022:   TUG:  41.76 seconds c FWW 01/25/2022        TUG:   71 seconds c  FWW     08/26/22 0001  Berg Balance Test  Sit to Stand 3  Standing Unsupported 3  Sitting with Back Unsupported but Feet Supported on Floor or Stool 4  Stand to Sit 3  Transfers 3  Standing Unsupported with Eyes Closed 3  Standing Unsupported with Feet Together 3  From Standing, Reach Forward with Outstretched Arm 1  From Standing Position, Pick up Object from Floor 3  From Standing Position, Turn to Look Behind Over each Shoulder 2  Turn 360 Degrees 1  Standing Unsupported, Alternately Place Feet on Step/Stool 0  Standing Unsupported, One Foot in Front 2  Standing on One Leg 0  Total Score 31       06/24/22 0001  Berg Balance Test  Sit to Stand 3  Standing Unsupported 3  Sitting with Back Unsupported but Feet Supported on Floor or Stool 4  Stand to Sit 3  Transfers 3  Standing Unsupported with Eyes Closed 3  Standing Unsupported with Feet Together 3  From Standing, Reach Forward with Outstretched Arm 3  From Standing Position, Pick up Object from Floor 3  From Standing Position, Turn to Look Behind Over each Shoulder 2  Turn 360 Degrees 1  Standing Unsupported, Alternately Place Feet on Step/Stool 0  Standing Unsupported, One Foot in Front 2  Standing on One Leg 0  Total Score 33  (previously 21 on 01/25/2022    01/25/2022 BERG 21    GAIT ASSESSMENT: 09/14/2022:  Able to perform SPC c SBA household distances within clinic environment.   08/26/2022:  Mod independent c rollator : 250 ft prior to fatigue.   2 min walk test 125 ft c rollator  08/19/2022:  Mod independent c FWW and rollator household distances in clinic.   07/09/2022:  Ambulation mod independent c FWW with marked reduced gait speed, limited in distance due to fatigue (household distances).  Able to perform ambulation c SPC c CGA to occasional SBA within clinic distances < 100 ft.   03/18/2022:  FWW into clinic c mod independent, decreased gait speed.  SPC use in Rt UE c CGA 33 ft today c cues for  sequencing.    TODAY'S TREATMENT:  09/27/2022 Therex: Recumbent bike 10 mins lvl 3-2, seat 7 Leg press double leg 81 lbs x 20, single leg 31 lbs 2 x 15 bilateral  Leg extension machine Double leg, 10# 3X10 Hamstring curl machine 2# DL 2X10    TherActivity  Step taps alternating on 4 inch step X 10 bilat with 2 UE support at sink Sidestepping 3 round trips at counter top, progressing Sit to stands at sink 5 reps with UE support, slow eccentrics Walking with cane (quad cane) SBA around clinic 25 feet X 1 (did not like this as much for her, not able to take as big steps, then used RW for ambulation 75 feet X 2  09/23/2022 Therex: Recumbent bike 10 mins lvl 3-2, seat 7 Leg press double leg 81 lbs x 20, single leg 31 lbs 2 x 15 bilateral  Leg extension machine Double leg, 10# 3X10    TherActivity  Step taps alternating on 4 inch step X 10 bilat with 2 UE support at sink Sidestepping 3 round trips at counter top, progressing Sit to stands  at sink 5 reps with UE support, slow eccentrics Walking with cane SBA around clinic 25 feet, 50 feet, 75 feet    PATIENT EDUCATION:  08/30/2022 Education details: HEP update Person educated: Patient Education method: Consulting civil engineer, Demonstration, Verbal cues, and Handouts Education comprehension: verbalized understanding, returned demonstration, and verbal cues required     HOME EXERCISE PROGRAM: Access Code: [email or web address] URL: [email or web address] Date: 08/30/2022 Prepared by: Scot Jun  Exercises - Seated March  - 1 x daily - 7 x weekly - 1-2 sets - 10 reps - Sit to Stand  - 1 x daily - 7 x weekly - 1-2 sets - 5-10 reps - Seated Long Arc Quad  - 1 x daily - 7 x weekly - 1-2 sets - 10 reps - 2 hold - Heel Toe Raises with Counter Support  - 1 x daily - 7 x weekly - 1-2 sets - 10 reps - Standing Tandem Balance with Counter Support  - 1 x daily - 7 x weekly - 1 sets - 3-5 reps - 20-30 hold - Seated Quad Set (Mirrored)  - 1-2 x  daily - 7 x weekly - 1 sets - 10 reps - 5 hold - Seated Straight Leg Heel Taps  - 1-2 x daily - 7 x weekly - 1-2 sets - 5-10 reps - Seated Hip Abduction with Resistance  - 1-2 x daily - 7 x weekly - 3 sets - 10 reps   ASSESSMENT:   CLINICAL IMPRESSION:  She has about maximized her improvements with skilled PT, she has one more visit left and we will condense and update her HEP and transition her to independent program. Recert performed as her PT plan of care had expired 09/23/22  OBJECTIVE IMPAIRMENTS Abnormal gait, decreased activity tolerance, decreased balance, decreased endurance, decreased mobility, difficulty walking, decreased strength, impaired perceived functional ability, impaired flexibility, improper body mechanics, postural dysfunction, and pain.    ACTIVITY LIMITATIONS carrying, lifting, bending, standing, squatting, stairs, transfers, bed mobility, reach over head, and locomotion level   PARTICIPATION LIMITATIONS: meal prep, cleaning, interpersonal relationship, driving, shopping, and community activity   PERSONAL FACTORS   History of 10/19/21 C3-4 C4-5 C5-6 ACDF with soft collar PMH bracial neuritis, abnormal gait, Degeneration of lumbar, HTN, gouty arthropathy, OA, lumbar laminectomy 2011, Rt TKA  are also affecting patient's functional outcome.    REHAB POTENTIAL: Good   CLINICAL DECISION MAKING: Stable/uncomplicated   EVALUATION COMPLEXITY: Low     GOALS: Goals reviewed with patient? Yes   Short term PT Goals (target date for Short term goals are 4 weeks 06/23/22) Patient will demonstrate independent use of home exercise program to maintain progress from in clinic treatments. Goal status: MET   Long term PT goals (target dates for all long term goals are 4 weeks  09/23/2022 )   1. Patient will demonstrate/report pain at worst less than or equal to 2/10 to facilitate minimal limitation in daily activity secondary to pain symptoms. Goal status: MET 04/05/22   2.  Patient will demonstrate independent use of home exercise program to facilitate ability to maintain/progress functional gains from skilled physical therapy services. Goal status: MET 05/24/22   3. Patient will demonstrate bilateral hip MMT 5/5 throughout to facilitate improved transfers, ambulation towards independence.  Goal status: on going 09/14/2022   4.  Patient will demonstrate BERG testing > or = 40 to indicate reduced fall risk.  Goal status:  on going 09/14/2022    5.  Patient will demonstrate TUG c LRAD <  or = 20 seconds to indicate reduced fall risk/ improved community ambulation.    Goal status:  on going 09/14/2022    6.  Patient will demonstrate ability to ambulation c LRAD (SPC, independent) community distances > 300 ft s rest  Goal status: on going 09/14/2022      PLAN: PT FREQUENCY: 1-2x/week   PT DURATION: 4 more  weeks from recert   PLANNED INTERVENTIONS: Therapeutic exercises, Therapeutic activity, Neuro Muscular re-education, Balance training, Gait training, Patient/Family education, Joint mobilization, Stair training, DME instructions, Dry Needling, Electrical stimulation, Cryotherapy, Moist heat, Taping, Ultrasound, Ionotophoresis 4mg /ml Dexamethasone, and Manual therapy.  All included unless contraindicated   PLAN FOR NEXT SESSION:  update HEP and DC next visit.   Elsie Ra, PT, DPT 09/27/22 1:11 PM

## 2022-09-30 ENCOUNTER — Ambulatory Visit (INDEPENDENT_AMBULATORY_CARE_PROVIDER_SITE_OTHER): Payer: HMO | Admitting: Physical Therapy

## 2022-09-30 ENCOUNTER — Encounter: Payer: Self-pay | Admitting: Physical Therapy

## 2022-09-30 DIAGNOSIS — M6281 Muscle weakness (generalized): Secondary | ICD-10-CM

## 2022-09-30 DIAGNOSIS — R2689 Other abnormalities of gait and mobility: Secondary | ICD-10-CM

## 2022-09-30 DIAGNOSIS — M542 Cervicalgia: Secondary | ICD-10-CM | POA: Diagnosis not present

## 2022-09-30 NOTE — Therapy (Signed)
OUTPATIENT PHYSICAL THERAPY TREATMENT/Discharge PHYSICAL THERAPY DISCHARGE SUMMARY  Visits from Start of Care: 51  Current functional level related to goals / functional outcomes: See below   Remaining deficits: See below   Education / Equipment: HEP  Plan:  Patient short term goals were met and 3/6 long term goals met. Patient is being discharged due to having extensive therapy episode of care and maximizing her functional progress at this time. She will continue at home with HEP.         Patient Name: Claudia [name] MRN: [id number] DOB:22-Jun-1934, 89 y.o., female Today's Date: 09/30/2022   END OF SESSION:   PT End of Session - 09/30/22 1310     Visit Number 51    Number of Visits 51    Date for PT Re-Evaluation 10/01/22    Authorization Type [employer] $15 copay    Progress Note Due on Visit 74    PT Start Time 1300    PT Stop Time 1344    PT Time Calculation (min) 44 min    Equipment Utilized During Treatment Gait belt    Activity Tolerance Patient tolerated treatment well    Behavior During Therapy WFL for tasks assessed/performed                  Past Medical History:  Diagnosis Date   Abnormality of gait    Brachial neuritis or radiculitis NOS    Degeneration of lumbar or lumbosacral intervertebral disc    Dysrhythmia    Essential hypertension, benign    Gouty arthropathy    Lumbago    Obesity    Osteoarthritis    Pure hypercholesterolemia    Unspecified hypothyroidism    Past Surgical History:  Procedure Laterality Date   ABDOMINAL HYSTERECTOMY  03/13/2010   ANTERIOR CERVICAL DECOMP/DISCECTOMY FUSION N/A 10/19/2021   Procedure: C3-4, C4-5, C5-6 ANTERIOR CERVICAL DISCECTOMY FUSION, ALLOGRAFT, PLATE;  Surgeon: [name], MD;  Location: [facility];  Service: Orthopedics;  Laterality: N/A;   BACK SURGERY     LUMBAR LAMINECTOMY  03/13/2010   right knee replacement  03/13/2010   Patient Active Problem List   Diagnosis Date Noted   Vitamin D  deficiency disease 06/21/2022   Encounter for general adult medical examination with abnormal findings 06/21/2022   Vitamin B12 deficiency neuropathy 02/15/2022   Hypercalcemia 02/15/2022   Cervical stricture or stenosis 10/20/2021   Cervical spinal stenosis 10/19/2021   Meningioma 08/24/2021   PAF (paroxysmal atrial fibrillation) 11/03/2020   Stage 3b chronic kidney disease 05/06/2020   Spinal stenosis in cervical region 05/12/2016   Therapeutic opioid-induced constipation (OIC) 01/01/2016   Chronic idiopathic constipation 09/11/2013   Lumbosacral spondylosis without myelopathy 04/09/2013   Postlaminectomy syndrome, lumbar region 04/09/2013   Anticoagulation management encounter 02/01/2012   Neuropathy, peripheral 08/04/2011   Pure hypercholesterolemia 08/04/2011   Essential hypertension, benign 08/04/2011   Hypothyroidism 08/04/2011   DJD (degenerative joint disease) of knee 08/04/2011   Gout 08/04/2011      THERAPY DIAG:  Cervicalgia  Other abnormalities of gait and mobility  Muscle weakness (generalized)  PCP: Janith Lima. MD   REFERRING PROVIDER: [name], MD   REFERRING DIAG: Z98.1 (ICD-10-CM) - S/P cervical spinal fusion    Rationale for Evaluation and Treatment Rehabilitation   ONSET DATE: Dec 2022   SUBJECTIVE:  SUBJECTIVE STATEMENT: She denies pain today, she agrees to transition to independent program   PERTINENT HISTORY:  History of 10/19/21 C3-4 C4-5 C5-6 ACDF with soft collar PMH bracial neuritis, abnormal gait, Degeneration of lumbar, HTN, gouty arthropathy, OA, lumbar laminectomy 2011, Rt TKA   PAIN:  No pain upon arrival.    PRECAUTIONS: None   WEIGHT BEARING RESTRICTIONS No   FALLS:  Has patient fallen in last 6 months? Maybe 1 fall in  last 6 months.  Several falls within last year.    LIVING ENVIRONMENT: Lives with:lives alone Lives in: House/apartment Stairs: 3 steps to enter deck, rail on Lt going up.  No stairs in house.  Has following equipment at home: FWW, cane   OCCUPATION: retired    PLOF: Independent  , cooking/cleaning, shopping/grocery store   [place] stronger, walking independently.     OBJECTIVE:    PATIENT SURVEYS:  01/25/2022 No foto - incorrect set up   COGNITION: 01/25/2022 Overall cognitive status: Within functional limits for tasks assessed    SENSATION: 01/25/2022 not tested today   POSTURE:  01/25/2022 rounded shoulders, forward head, increased thoracic kyphosis, and flexed trunk    PALPATION: 01/25/2022 no specific tenderness noted in screening for balance.      MMT: 01/25/2022:  Seated resisted static testing hip abduction Rt: strong/painless. Lt moderate strength, painless (held MMT due to difficulty in bed mobility positioning MMT Right 01/25/2022 Left 01/25/2022 Left 02/22/2022 Left 03/11/2022 Left 03/16/22 Left 04/01/2022 Left 04/05/22 Left/Right 05/24/22 Left/Right 07/21/22 Right/ Left 08/26/2022 Right/Left 09/30/22  Shoulder flexion               Shoulder extension               Shoulder abduction               Shoulder adduction               Shoulder extension               Shoulder internal rotation               Shoulder external rotation                                               Hip flexion 5/5 4/5 4/5 4/5 4/5 4+/5 4+ 4+/4+ 4+/4+ 5  /  4+ 5/4+  Hip extension               Hip abduction               Knee flexion 5/5 5/5        5   /   5 5/5  Knee extension  5/5 5/5    4+/5 4+ 4+/4+ 4+/4+ 5   / 4+ 5/4+  Ankle DF 5/5 4/5  4/5 4+/5    5/5 5  /  4+ 5/4+   (Blank rows = not tested)   CERVICAL SPECIAL TESTS:  01/25/2022 none performed   FUNCTIONAL TESTS:  09/30/22: TUG with rollator  31 sec 3/12/024:    TUG with rollator 30.6 seconds  08/26/2022:   TUG  with FWW: 33.1 seconds   TUG with rollator: 30.4 seconds  06/03/2022:  TUG c FWW:  34.95 seconds, 32.84 seconds 05/24/22: TUG c FWW:  36 seconds 05/06/2022: TUG c FWW:  37 seconds  at end of session 04/05/22 TUG 38.5s seconds with FWW 03/22/22: TUG 44 seconds C FWW (done at very end of session) 03/08/2022:   TUG:  41.76 seconds c FWW 01/25/2022        TUG:   71 seconds c FWW     08/26/22 0001  Berg Balance Test  Sit to Stand 3  Standing Unsupported 3  Sitting with Back Unsupported but Feet Supported on Floor or Stool 4  Stand to Sit 3  Transfers 3  Standing Unsupported with Eyes Closed 3  Standing Unsupported with Feet Together 3  From Standing, Reach Forward with Outstretched Arm 1  From Standing Position, Pick up Object from Floor 3  From Standing Position, Turn to Look Behind Over each Shoulder 2  Turn 360 Degrees 1  Standing Unsupported, Alternately Place Feet on Step/Stool 0  Standing Unsupported, One Foot in Front 2  Standing on One Leg 0  Total Score 31       06/24/22 0001  Berg Balance Test  Sit to Stand 3  Standing Unsupported 3  Sitting with Back Unsupported but Feet Supported on Floor or Stool 4  Stand to Sit 3  Transfers 3  Standing Unsupported with Eyes Closed 3  Standing Unsupported with Feet Together 3  From Standing, Reach Forward with Outstretched Arm 3  From Standing Position, Pick up Object from Floor 3  From Standing Position, Turn to Look Behind Over each Shoulder 2  Turn 360 Degrees 1  Standing Unsupported, Alternately Place Feet on Step/Stool 0  Standing Unsupported, One Foot in Front 2  Standing on One Leg 0  Total Score 33  (previously 21 on 01/25/2022    01/25/2022 BERG 21    GAIT ASSESSMENT: 09/14/2022:  Able to perform SPC c SBA household distances within clinic environment.   08/26/2022:  Mod independent c rollator : 250 ft prior to fatigue.   2 min walk test 125 ft c rollator  08/19/2022:  Mod independent c FWW and rollator household  distances in clinic.   07/09/2022:  Ambulation mod independent c FWW with marked reduced gait speed, limited in distance due to fatigue (household distances).  Able to perform ambulation c SPC c CGA to occasional SBA within clinic distances < 100 ft.   03/18/2022:  FWW into clinic c mod independent, decreased gait speed.  SPC use in Rt UE c CGA 33 ft today c cues for sequencing.    TODAY'S TREATMENT:  09/27/2022 Therex: Recumbent bike 10 mins lvl 3-2, seat 7 Leg press double leg 81 lbs x 20, single leg 31 lbs 2 x 15 bilateral  Leg extension machine Double leg, 10# 3X10 Hamstring curl machine 2# DL 2X10    TherActivity TUG test see above Step taps alternating on 4 inch step X 10 bilat with 2 UE support at sink Sidestepping 3 round trips at counter top, progressing Sit to stands at sink 10 reps with UE support, slow eccentrics Walking with rollator 300 feet total   PATIENT EDUCATION:  08/30/2022 Education details: HEP update Person educated: Patient Education method: Consulting civil engineer, Demonstration, Verbal cues, and Handouts Education comprehension: verbalized understanding, returned demonstration, and verbal cues required     HOME EXERCISE PROGRAM: Access Code: [email or web address] URL: [email or web address] Date: 09/30/2022 Prepared by: Elsie Ra  Exercises - Seated March  - 1 x daily - 7 x weekly - 1-2 sets - 10 reps - Sit to Stand  - 1 x daily - 7 x weekly - 1-2 sets - 5-10  reps - Seated Long Arc Quad  - 1 x daily - 7 x weekly - 1-2 sets - 10 reps - 2 hold - Heel Toe Raises with Counter Support  - 1 x daily - 7 x weekly - 1-2 sets - 10 reps - Standing Tandem Balance with Counter Support  - 1 x daily - 7 x weekly - 1 sets - 3-5 reps - 20-30 hold - Seated Straight Leg Heel Taps  - 1-2 x daily - 7 x weekly - 1-2 sets - 5-10 reps - Side Stepping with Counter Support  - 2 x daily - 6 x weekly - 1 sets - 3-5 reps - Squat with Chair and Counter Support  - 2 x daily - 6 x weekly -  1-2 sets - 10 reps - Alternating Step Taps with Counter Support  - 2 x daily - 6 x weekly - 1 sets - 10 reps ASSESSMENT:   CLINICAL IMPRESSION:  She has maximized her improvements with skilled PT, and we will transition her to independent program. I did condense and progress her HEP for furhter work at home to continue to work to build up her leg strength, balance, and endurance for standing/walking activity on her own at home.   OBJECTIVE IMPAIRMENTS Abnormal gait, decreased activity tolerance, decreased balance, decreased endurance, decreased mobility, difficulty walking, decreased strength, impaired perceived functional ability, impaired flexibility, improper body mechanics, postural dysfunction, and pain.    ACTIVITY LIMITATIONS carrying, lifting, bending, standing, squatting, stairs, transfers, bed mobility, reach over head, and locomotion level   PARTICIPATION LIMITATIONS: meal prep, cleaning, interpersonal relationship, driving, shopping, and community activity   PERSONAL FACTORS   History of 10/19/21 C3-4 C4-5 C5-6 ACDF with soft collar PMH bracial neuritis, abnormal gait, Degeneration of lumbar, HTN, gouty arthropathy, OA, lumbar laminectomy 2011, Rt TKA  are also affecting patient's functional outcome.    REHAB POTENTIAL: Good   CLINICAL DECISION MAKING: Stable/uncomplicated   EVALUATION COMPLEXITY: Low     GOALS: Goals reviewed with patient? Yes   Short term PT Goals (target date for Short term goals are 4 weeks 06/23/22) Patient will demonstrate independent use of home exercise program to maintain progress from in clinic treatments. Goal status: MET   Long term PT goals (target dates for all long term goals are 4 weeks  09/23/2022 )   1. Patient will demonstrate/report pain at worst less than or equal to 2/10 to facilitate minimal limitation in daily activity secondary to pain symptoms. Goal status: MET 04/05/22   2. Patient will demonstrate independent use of home exercise  program to facilitate ability to maintain/progress functional gains from skilled physical therapy services. Goal status: MET 05/24/22   3. Patient will demonstrate bilateral hip MMT 5/5 throughout to facilitate improved transfers, ambulation towards independence.  Goal status: on going 09/14/2022   4.  Patient will demonstrate BERG testing > or = 40 to indicate reduced fall risk.  Goal status:  Plateau at 33 points    5.  Patient will demonstrate TUG c LRAD < or = 20 seconds to indicate reduced fall risk/ improved community ambulation.    Goal status:  not met, plateau at 30.6 seconds 09/30/22    6.  Patient will demonstrate ability to ambulation c LRAD (SPC, independent) community distances > 300 ft s rest  Goal status: MET 09/30/22      PLAN: PT FREQUENCY: 1-2x/week   PT DURATION: 4 more  weeks from recert   PLANNED INTERVENTIONS: Therapeutic exercises, Therapeutic activity,  Neuro Muscular re-education, Balance training, Gait training, Patient/Family education, Joint mobilization, Stair training, DME instructions, Dry Needling, Electrical stimulation, Cryotherapy, Moist heat, Taping, Ultrasound, Ionotophoresis 4mg /ml Dexamethasone, and Manual therapy.  All included unless contraindicated   PLAN FOR NEXT SESSION:  DC today to HEP  Elsie Ra, PT, DPT 09/30/22 1:11 PM

## 2022-10-18 ENCOUNTER — Ambulatory Visit (INDEPENDENT_AMBULATORY_CARE_PROVIDER_SITE_OTHER): Payer: HMO

## 2022-10-18 DIAGNOSIS — E538 Deficiency of other specified B group vitamins: Secondary | ICD-10-CM | POA: Diagnosis not present

## 2022-10-18 MED ORDER — CYANOCOBALAMIN 1000 MCG/ML IJ SOLN
1000.0000 ug | Freq: Once | INTRAMUSCULAR | Status: AC
  Administered 2022-10-18: 1000 ug via INTRAMUSCULAR

## 2022-10-18 NOTE — Progress Notes (Signed)
After obtaining consent, and per orders of Dr. Jones, injection of B12 given by Shakura Cowing P Ethne Jeon. Patient instructed to report any adverse reaction to me immediately.  

## 2022-10-19 ENCOUNTER — Other Ambulatory Visit: Payer: Self-pay | Admitting: Internal Medicine

## 2022-10-19 DIAGNOSIS — M961 Postlaminectomy syndrome, not elsewhere classified: Secondary | ICD-10-CM

## 2022-10-19 DIAGNOSIS — M170 Bilateral primary osteoarthritis of knee: Secondary | ICD-10-CM

## 2022-10-19 DIAGNOSIS — M47817 Spondylosis without myelopathy or radiculopathy, lumbosacral region: Secondary | ICD-10-CM

## 2022-10-24 DIAGNOSIS — M47817 Spondylosis without myelopathy or radiculopathy, lumbosacral region: Secondary | ICD-10-CM | POA: Diagnosis not present

## 2022-10-26 ENCOUNTER — Telehealth: Payer: Self-pay | Admitting: Internal Medicine

## 2022-10-26 NOTE — Telephone Encounter (Signed)
Contacted Claudia [name] to schedule their annual wellness visit. Appointment made for 11/04/2022.  Claudia [name] Care Guide [facility] TEAM Direct Dial: [phone]    

## 2022-10-26 NOTE — Telephone Encounter (Signed)
Contacted [place] to schedule their annual wellness visit. Appointment made for 11/04/2022.  [facility] Care Guide [facility] TEAM Direct Dial: [phone]

## 2022-10-29 ENCOUNTER — Ambulatory Visit: Payer: PPO | Admitting: Orthopaedic Surgery

## 2022-11-04 ENCOUNTER — Ambulatory Visit (INDEPENDENT_AMBULATORY_CARE_PROVIDER_SITE_OTHER): Payer: HMO

## 2022-11-04 VITALS — Ht 69.0 in | Wt 158.0 lb

## 2022-11-04 DIAGNOSIS — Z0000 Encounter for general adult medical examination without abnormal findings: Secondary | ICD-10-CM | POA: Diagnosis not present

## 2022-11-04 NOTE — Progress Notes (Signed)
I connected with  Claudia [name] on 11/04/22 by a audio enabled telemedicine application and verified that I am speaking with the correct person using two identifiers.  Patient Location: Home  Provider Location: Office/Clinic  I discussed the limitations of evaluation and management by telemedicine. The patient expressed understanding and agreed to proceed.  Subjective:   Claudia [name] is a 89 y.o. female who presents for Medicare Annual (Subsequent) preventive examination.  Review of Systems     Cardiac Risk Factors include: advanced age (>17men, >73 women);hypertension;family history of premature cardiovascular disease     Objective:    Today's Vitals   11/04/22 1504  Weight: 158 lb (71.7 kg)  Height: 5\' 9"  (1.753 m)  PainSc: 0-No pain   Body mass index is 23.33 kg/m.     11/04/2022    3:08 PM 01/25/2022   11:47 AM 12/08/2021    2:03 PM 10/19/2021    6:06 AM 10/13/2021    2:16 PM 10/08/2020   11:08 AM 05/05/2020    2:11 PM  Advanced Directives  Does Patient Have a Medical Advance Directive? Yes Yes Yes Yes Yes Yes Yes  Type of Estate agent of [place];Living will Healthcare Power of [place];Living will Living will;Healthcare Power of [employer] Power of [place];Living will Healthcare Power of [place];Living will Living will;Healthcare Power of Attorney Living will;Healthcare Power of Attorney  Does patient want to make changes to medical advance directive?   No - Patient declined   No - Patient declined No - Patient declined  Copy of Healthcare Power of Attorney in Chart? No - copy requested No - copy requested No - copy requested No - copy requested   No - copy requested    Current Medications (verified) Outpatient Encounter Medications as of 11/04/2022  Medication Sig   Cholecalciferol 50 MCG (2000 UT) TABS Take 1 tablet (2,000 Units total) by mouth daily.   diltiazem (CARDIZEM) 30 MG tablet TAKE ONE TABLET EVERY 4 HOURS AS NEEDED  FOR afib heart rate greater THAN 100 aslong as blood pressure less THAN 100   HYDROcodone-acetaminophen (NORCO/VICODIN) 5-325 MG tablet TAKE ONE TABLET BY MOUTH EVERY SIX HOURS AS NEEDED pain   linaclotide (LINZESS) 290 MCG CAPS capsule Take 1 capsule (290 mcg total) by mouth daily before breakfast.   Rivaroxaban (XARELTO) 15 MG TABS tablet Take 1 tablet (15 mg total) by mouth daily with supper.   torsemide (DEMADEX) 20 MG tablet TAKE ONE TABLET BY MOUTH ONCE DAILY   vitamin C (ASCORBIC ACID) 500 MG tablet Take 500 mg by mouth daily.   Facility-Administered Encounter Medications as of 11/04/2022  Medication   cyanocobalamin (VITAMIN B12) injection 1,000 mcg    Allergies (verified) Lipitor [atorvastatin] and Trileptal [oxcarbazepine]   History: Past Medical History:  Diagnosis Date   Abnormality of gait    Brachial neuritis or radiculitis NOS    Degeneration of lumbar or lumbosacral intervertebral disc    Dysrhythmia    Essential hypertension, benign    Gouty arthropathy    Lumbago    Obesity    Osteoarthritis    Pure hypercholesterolemia    Unspecified hypothyroidism    Past Surgical History:  Procedure Laterality Date   ABDOMINAL HYSTERECTOMY  03/13/2010   ANTERIOR CERVICAL DECOMP/DISCECTOMY FUSION N/A 10/19/2021   Procedure: C3-4, C4-5, C5-6 ANTERIOR CERVICAL DISCECTOMY FUSION, ALLOGRAFT, PLATE;  Surgeon: Eldred Manges, MD;  Location: [facility] OR;  Service: Orthopedics;  Laterality: N/A;   BACK SURGERY     LUMBAR  LAMINECTOMY  03/13/2010   right knee replacement  03/13/2010   Family History  Problem Relation Age of Onset   Heart disease Father    Heart attack Father    Hypertension Mother    Alzheimer's disease Mother    Diabetes Sister    Cirrhosis Brother    Diabetes Sister    Diabetes Sister    Diabetes Brother    Heart attack Brother    Cancer Neg Hx    Kidney disease Neg Hx    Social History   Socioeconomic History   Marital status: Married    Spouse name: Not on  file   Number of children: Not on file   Years of education: Not on file   Highest education level: Not on file  Occupational History   Not on file  Tobacco Use   Smoking status: Never   Smokeless tobacco: Never  Vaping Use   Vaping Use: Never used  Substance and Sexual Activity   Alcohol use: Yes    Comment: 1 glass wine/month   Drug use: No   Sexual activity: Never  Other Topics Concern   Not on file  Social History Narrative   Not on file   Social Determinants of Health   Financial Resource Strain: Low Risk  (11/04/2022)   Overall Financial Resource Strain (CARDIA)    Difficulty of Paying Living Expenses: Not hard at all  Food Insecurity: No Food Insecurity (11/04/2022)   Hunger Vital Sign    Worried About Running Out of Food in the Last Year: Never true    Ran Out of Food in the Last Year: Never true  Transportation Needs: No Transportation Needs (11/04/2022)   PRAPARE - Administrator, Civil Service (Medical): No    Lack of Transportation (Non-Medical): No  Physical Activity: Insufficiently Active (11/04/2022)   Exercise Vital Sign    Days of Exercise per Week: 3 days    Minutes of Exercise per Session: 30 min  Stress: No Stress Concern Present (11/04/2022)   [employer] of Occupational Health - Occupational Stress Questionnaire    Feeling of Stress : Not at all  Social Connections: Moderately Isolated (11/04/2022)   Social Connection and Isolation Panel [NHANES]    Frequency of Communication with Friends and Family: More than three times a week    Frequency of Social Gatherings with Friends and Family: Never    Attends Religious Services: 1 to 4 times per year    Active Member of [employer] or Organizations: No    Attends Banker Meetings: Never    Marital Status: Widowed    Tobacco Counseling Counseling given: Not Answered   Clinical Intake:  Pre-visit preparation completed: Yes  Pain : No/denies pain Pain Score: 0-No pain     BMI -  recorded: 23.33 Nutritional Status: BMI of 19-24  Normal Nutritional Risks: None Diabetes: No  How often do you need to have someone help you when you read instructions, pamphlets, or other written materials from your doctor or pharmacy?: 1 - Never What is the last grade level you completed in school?: HSG  Diabetic? No  Interpreter Needed?: No  Information entered by :: Susie Cassette, LPN.   Activities of Daily Living    11/04/2022    3:11 PM 12/08/2021    2:06 PM  In your present state of health, do you have any difficulty performing the following activities:  Hearing? 0 0  Vision? 0 0  Difficulty concentrating or making  decisions? 0 0  Walking or climbing stairs? 0 1  Dressing or bathing? 0 0  Doing errands, shopping? 0 0  Preparing Food and eating ? N N  Using the Toilet? N N  In the past six months, have you accidently leaked urine? N N  Do you have problems with loss of bowel control? N N  Managing your Medications? N N  Managing your Finances? N N  Housekeeping or managing your Housekeeping? N N    Patient Care Team: Etta Grandchild, MD as PCP - General (Internal Medicine) Chilton Si, MD as PCP - Cardiology (Cardiology) Estill Bamberg, MD as Consulting Physician (Orthopedic Surgery) Genia Del Daisy Blossom, MD as Consulting Physician (Ophthalmology) Kathyrn Sheriff, [facility] as Pharmacist (Pharmacist)  Indicate any recent Medical Services you may have received from other than [facility] providers in the past year (date may be approximate).     Assessment:   This is a routine wellness examination for [place].  Hearing/Vision screen Hearing Screening - Comments:: Denies hearing difficulties   Vision Screening - Comments:: Wears reading glasses - up to date with routine eye exams with Mack Hook, MD.   Dietary issues and exercise activities discussed: Current Exercise Habits: Home exercise routine (Physical Therapy exercises), Type of exercise: walking,  Time (Minutes): 30, Frequency (Times/Week): 3, Weekly Exercise (Minutes/Week): 90, Intensity: Mild   Goals Addressed             This Visit's Progress    My goal for 2024 is to be able to walk without my walker and get back to exercising.        Depression Screen    11/04/2022    3:10 PM 06/14/2022    2:51 PM 12/08/2021    2:06 PM 06/02/2021    2:14 PM 05/05/2020    1:14 PM 06/13/2018    1:15 PM 04/19/2017   10:51 AM  PHQ 2/9 Scores  PHQ - 2 Score 0 0 0 0 0 0 0  PHQ- 9 Score 0          Fall Risk    11/04/2022    3:10 PM 06/14/2022    2:50 PM 12/08/2021    2:04 PM 06/02/2021    2:13 PM 05/05/2020    1:14 PM  Fall Risk   Falls in the past year? 0 0 0 0 0  Number falls in past yr: 0 0 0  0  Injury with Fall? 0 0 0  0  Risk for fall due to : No Fall Risks No Fall Risks Other (Comment);Impaired mobility;Orthopedic patient    Risk for fall due to: Comment   s/p back surgery    Follow up Falls prevention discussed Falls evaluation completed Falls evaluation completed;Falls prevention discussed      FALL RISK PREVENTION PERTAINING TO THE HOME:  Any stairs in or around the home? No  If so, are there any without handrails? No  Home free of loose throw rugs in walkways, pet beds, electrical cords, etc? Yes  Adequate lighting in your home to reduce risk of falls? Yes   ASSISTIVE DEVICES UTILIZED TO PREVENT FALLS:  Life alert? No  Use of a cane, walker or w/c? Yes  Grab bars in the bathroom? Yes  Shower chair or bench in shower? Yes  Elevated toilet seat or a handicapped toilet? Yes   TIMED UP AND GO:  Was the test performed? No . Telephonic Visit  Cognitive Function:        11/04/2022  3:10 PM 12/08/2021    2:07 PM 05/05/2020    2:13 PM  6CIT Screen  What Year? 0 points 0 points 0 points  What month? 0 points 0 points 0 points  What time? 0 points 0 points 0 points  Count back from 20 0 points 0 points 0 points  Months in reverse 0 points 0 points 0 points  Repeat  phrase 0 points 0 points 0 points  Total Score 0 points 0 points 0 points    Immunizations Immunization History  Administered Date(s) Administered   DTaP 01/01/2008   Fluad Quad(high Dose 65+) 03/30/2019, 05/05/2021, 03/22/2022   Influenza Split 04/06/2012   Influenza Whole 05/04/2011   Influenza, High Dose Seasonal PF 04/17/2013, 03/04/2016, 04/19/2017, 04/07/2018   Influenza,inj,Quad PF,6+ Mos 04/11/2014, 04/14/2015   Influenza-Unspecified 04/15/2020   PFIZER(Purple Top)SARS-COV-2 Vaccination 07/30/2019, 08/25/2019, 03/15/2020, 11/19/2020   Pneumococcal Conjugate-13 04/02/2008, 10/08/2013   Pneumococcal Polysaccharide-23 09/02/2015, 11/03/2020   RSV,unspecified 03/22/2022   Tdap 01/01/2008, 08/23/2017   Unspecified SARS-COV-2 Vaccination 03/22/2022   Zoster Recombinat (Shingrix) 01/03/2017, 04/02/2017   Zoster, Live 08/03/2006    TDAP status: Up to date  Flu Vaccine status: Up to date  Pneumococcal vaccine status: Up to date  Covid-19 vaccine status: Completed vaccines  Qualifies for Shingles Vaccine? Yes   Zostavax completed Yes   Shingrix Completed?: Yes  Screening Tests Health Maintenance  Topic Date Due   INFLUENZA VACCINE  01/27/2023   Medicare Annual Wellness (AWV)  11/04/2023   DTaP/Tdap/Td (4 - Td or Tdap) 08/24/2027   Pneumonia Vaccine 31+ Years old  Completed   DEXA SCAN  Completed   Zoster Vaccines- Shingrix  Completed   HPV VACCINES  Aged Out   COVID-19 Vaccine  Discontinued    Health Maintenance  There are no preventive care reminders to display for this patient.   Colorectal cancer screening: No longer required.   Mammogram status: No longer required due to age.  Bone Density status: No longer required or recommended.  Lung Cancer Screening: (Low Dose CT Chest recommended if Age 34-80 years, 30 pack-year currently smoking OR have quit w/in 15years.) does not qualify.   Lung Cancer Screening Referral: no  Additional  Screening:  Hepatitis C Screening: does not qualify; Completed: no  Vision Screening: Recommended annual ophthalmology exams for early detection of glaucoma and other disorders of the eye. Is the patient up to date with their annual eye exam?  Yes  Who is the provider or what is the name of the office in which the patient attends annual eye exams? Mack Hook, MD. If pt is not established with a provider, would they like to be referred to a provider to establish care? No .   Dental Screening: Recommended annual dental exams for [name] oral hygiene  Community Resource Referral / Chronic Care Management: CRR required this visit?  No   CCM required this visit?  No      Plan:     I have personally reviewed and noted the following in the patient's chart:   Medical and social history Use of alcohol, tobacco or illicit drugs  Current medications and supplements including opioid prescriptions. Patient is currently taking opioid prescriptions. Information provided to patient regarding non-opioid alternatives. Patient advised to discuss non-opioid treatment plan with their provider. Functional ability and status Nutritional status Physical activity Advanced directives List of other physicians Hospitalizations, surgeries, and ER visits in previous 12 months Vitals Screenings to include cognitive, depression, and falls Referrals and appointments  In  addition, I have reviewed and discussed with patient certain preventive protocols, quality metrics, and best practice recommendations. A written personalized care plan for preventive services as well as general preventive health recommendations were provided to patient.     Mickeal Needy, LPN   07/03/1094   Nurse Notes:  Normal cognitive status assessed by direct observation via telephone conversation by this Nurse Health Advisor. No abnormalities found.

## 2022-11-04 NOTE — Patient Instructions (Addendum)
[name] , Thank you for taking time to come for your Medicare Wellness Visit. I appreciate your ongoing commitment to your health goals. Please review the following plan we discussed and let me know if I can assist you in the future.   These are the goals we discussed:  Goals      Manage My Medicine     Timeframe:  Long-Range Goal Priority:  Medium Start Date:   11/17/2021                     Expected End Date:   11/18/2022                    Follow Up Date 04/2022   - call for medicine refill 2 or 3 days before it runs out - call if I am sick and can't take my medicine - keep a list of all the medicines I take; vitamins and herbals too - learn to read medicine labels    Why is this important?   These steps will help you keep on track with your medicines.   Notes:      My goal for 2024 is to be able to walk without my walker and get back to exercising.        This is a list of the screening recommended for you and due dates:  Health Maintenance  Topic Date Due   Flu Shot  01/27/2023   Medicare Annual Wellness Visit  11/04/2023   DTaP/Tdap/Td vaccine (4 - Td or Tdap) 08/24/2027   Pneumonia Vaccine  Completed   DEXA scan (bone density measurement)  Completed   Zoster (Shingles) Vaccine  Completed   HPV Vaccine  Aged Out   COVID-19 Vaccine  Discontinued    Advanced directives: YES  Conditions/risks identified: YES  Next appointment: Follow up in one year for your annual wellness visit.   Preventive Care 27 Years and Older, Female Preventive care refers to lifestyle choices and visits with your health care provider that can promote health and wellness. What does preventive care include? A yearly physical exam. This is also called an annual well check. Dental exams once or twice a year. Routine eye exams. Ask your health care provider how often you should have your eyes checked. Personal lifestyle choices, including: Daily care of your teeth and gums. Regular  physical activity. Eating a healthy diet. Avoiding tobacco and drug use. Limiting alcohol use. Practicing safe sex. Taking low-dose aspirin every day. Taking vitamin and mineral supplements as recommended by your health care provider. What happens during an annual well check? The services and screenings done by your health care provider during your annual well check will depend on your age, overall health, lifestyle risk factors, and family history of disease. Counseling  Your health care provider may ask you questions about your: Alcohol use. Tobacco use. Drug use. Emotional well-being. Home and relationship well-being. Sexual activity. Eating habits. History of falls. Memory and ability to understand (cognition). Work and work Astronomer. Reproductive health. Screening  You may have the following tests or measurements: Height, weight, and BMI. Blood pressure. Lipid and cholesterol levels. These may be checked every 5 years, or more frequently if you are over 70 years old. Skin check. Lung cancer screening. You may have this screening every year starting at age 52 if you have a 30-pack-year history of smoking and currently smoke or have quit within the past 15 years. Fecal occult blood test (FOBT) of  the stool. You may have this test every year starting at age 57. Flexible sigmoidoscopy or colonoscopy. You may have a sigmoidoscopy every 5 years or a colonoscopy every 10 years starting at age 23. Hepatitis C blood test. Hepatitis B blood test. Sexually transmitted disease (STD) testing. Diabetes screening. This is done by checking your blood sugar (glucose) after you have not eaten for a while (fasting). You may have this done every 1-3 years. Bone density scan. This is done to screen for osteoporosis. You may have this done starting at age 64. Mammogram. This may be done every 1-2 years. Talk to your health care provider about how often you should have regular mammograms. Talk  with your health care provider about your test results, treatment options, and if necessary, the need for more tests. Vaccines  Your health care provider may recommend certain vaccines, such as: Influenza vaccine. This is recommended every year. Tetanus, diphtheria, and acellular pertussis (Tdap, Td) vaccine. You may need a Td booster every 10 years. Zoster vaccine. You may need this after age 31. Pneumococcal 13-valent conjugate (PCV13) vaccine. One dose is recommended after age 38. Pneumococcal polysaccharide (PPSV23) vaccine. One dose is recommended after age 57. Talk to your health care provider about which screenings and vaccines you need and how often you need them. This information is not intended to replace advice given to you by your health care provider. Make sure you discuss any questions you have with your health care provider. Document Released: 07/11/2015 Document Revised: 03/03/2016 Document Reviewed: 04/15/2015 Elsevier Interactive Patient Education  2017 [employer].  Fall Prevention in the Home Falls can cause injuries. They can happen to people of all ages. There are many things you can do to make your home safe and to help prevent falls. What can I do on the outside of my home? Regularly fix the edges of walkways and driveways and fix any cracks. Remove anything that might make you trip as you walk through a door, such as a raised step or threshold. Trim any bushes or trees on the path to your home. Use bright outdoor lighting. Clear any walking paths of anything that might make someone trip, such as rocks or tools. Regularly check to see if handrails are loose or broken. Make sure that both sides of any steps have handrails. Any raised decks and porches should have guardrails on the edges. Have any leaves, snow, or ice cleared regularly. Use sand or salt on walking paths during winter. Clean up any spills in your garage right away. This includes oil or grease  spills. What can I do in the bathroom? Use night lights. Install grab bars by the toilet and in the tub and shower. Do not use towel bars as grab bars. Use non-skid mats or decals in the tub or shower. If you need to sit down in the shower, use a plastic, non-slip stool. Keep the floor dry. Clean up any water that spills on the floor as soon as it happens. Remove soap buildup in the tub or shower regularly. Attach bath mats securely with double-sided non-slip rug tape. Do not have throw rugs and other things on the floor that can make you trip. What can I do in the bedroom? Use night lights. Make sure that you have a light by your bed that is easy to reach. Do not use any sheets or blankets that are too big for your bed. They should not hang down onto the floor. Have a firm chair  that has side arms. You can use this for support while you get dressed. Do not have throw rugs and other things on the floor that can make you trip. What can I do in the kitchen? Clean up any spills right away. Avoid walking on wet floors. Keep items that you use a lot in easy-to-reach places. If you need to reach something above you, use a strong step stool that has a grab bar. Keep electrical cords out of the way. Do not use floor polish or wax that makes floors slippery. If you must use wax, use non-skid floor wax. Do not have throw rugs and other things on the floor that can make you trip. What can I do with my stairs? Do not leave any items on the stairs. Make sure that there are handrails on both sides of the stairs and use them. Fix handrails that are broken or loose. Make sure that handrails are as long as the stairways. Check any carpeting to make sure that it is firmly attached to the stairs. Fix any carpet that is loose or worn. Avoid having throw rugs at the top or bottom of the stairs. If you do have throw rugs, attach them to the floor with carpet tape. Make sure that you have a light switch at the  top of the stairs and the bottom of the stairs. If you do not have them, ask someone to add them for you. What else can I do to help prevent falls? Wear shoes that: Do not have high heels. Have rubber bottoms. Are comfortable and fit you well. Are closed at the toe. Do not wear sandals. If you use a stepladder: Make sure that it is fully opened. Do not climb a closed stepladder. Make sure that both sides of the stepladder are locked into place. Ask someone to hold it for you, if possible. Clearly mark and make sure that you can see: Any grab bars or handrails. First and last steps. Where the edge of each step is. Use tools that help you move around (mobility aids) if they are needed. These include: Canes. Walkers. Scooters. Crutches. Turn on the lights when you go into a dark area. Replace any light bulbs as soon as they burn out. Set up your furniture so you have a clear path. Avoid moving your furniture around. If any of your floors are uneven, fix them. If there are any pets around you, be aware of where they are. Review your medicines with your doctor. Some medicines can make you feel dizzy. This can increase your chance of falling. Ask your doctor what other things that you can do to help prevent falls. This information is not intended to replace advice given to you by your health care provider. Make sure you discuss any questions you have with your health care provider. Document Released: 04/10/2009 Document Revised: 11/20/2015 Document Reviewed: 07/19/2014 Elsevier Interactive Patient Education  2017 [employer].

## 2022-11-10 ENCOUNTER — Other Ambulatory Visit: Payer: Self-pay | Admitting: Internal Medicine

## 2022-11-10 DIAGNOSIS — M170 Bilateral primary osteoarthritis of knee: Secondary | ICD-10-CM

## 2022-11-10 DIAGNOSIS — M47817 Spondylosis without myelopathy or radiculopathy, lumbosacral region: Secondary | ICD-10-CM

## 2022-11-10 DIAGNOSIS — M961 Postlaminectomy syndrome, not elsewhere classified: Secondary | ICD-10-CM

## 2022-11-15 ENCOUNTER — Telehealth: Payer: Self-pay | Admitting: Internal Medicine

## 2022-11-15 NOTE — Telephone Encounter (Signed)
Patient said she seems to be urinating more when taking torsemide (DEMADEX) 20 MG tablet . She wanted to know if she should continue taking it. She said she doesn't want to come in since she already has an appointment scheduled for 12/13/2022. She would like a call back at [phone].

## 2022-12-05 ENCOUNTER — Other Ambulatory Visit: Payer: Self-pay | Admitting: Internal Medicine

## 2022-12-05 DIAGNOSIS — I10 Essential (primary) hypertension: Secondary | ICD-10-CM

## 2022-12-09 ENCOUNTER — Other Ambulatory Visit: Payer: Self-pay | Admitting: Cardiovascular Disease

## 2022-12-09 ENCOUNTER — Other Ambulatory Visit: Payer: Self-pay | Admitting: Internal Medicine

## 2022-12-09 DIAGNOSIS — I480 Paroxysmal atrial fibrillation: Secondary | ICD-10-CM

## 2022-12-09 DIAGNOSIS — M47817 Spondylosis without myelopathy or radiculopathy, lumbosacral region: Secondary | ICD-10-CM

## 2022-12-09 DIAGNOSIS — M961 Postlaminectomy syndrome, not elsewhere classified: Secondary | ICD-10-CM

## 2022-12-09 DIAGNOSIS — M170 Bilateral primary osteoarthritis of knee: Secondary | ICD-10-CM

## 2022-12-09 NOTE — Telephone Encounter (Signed)
Prescription refill request for Xarelto received.  Indication: Afib  Last office visit: 09/13/22 Dan Humphreys)  Weight: 71.1kg Age: 89 Scr: 1.05 (06/14/22)  CrCl: 43.52ml/min  Appropriate dose. Refill sent.

## 2022-12-09 NOTE — Telephone Encounter (Signed)
Please review for refill. Thank you! 

## 2022-12-13 ENCOUNTER — Encounter: Payer: Self-pay | Admitting: Internal Medicine

## 2022-12-13 ENCOUNTER — Ambulatory Visit (INDEPENDENT_AMBULATORY_CARE_PROVIDER_SITE_OTHER): Payer: HMO | Admitting: Internal Medicine

## 2022-12-13 VITALS — BP 138/66 | HR 54 | Temp 98.2°F | Resp 16 | Ht 69.0 in | Wt 159.0 lb

## 2022-12-13 DIAGNOSIS — N1832 Chronic kidney disease, stage 3b: Secondary | ICD-10-CM

## 2022-12-13 DIAGNOSIS — E538 Deficiency of other specified B group vitamins: Secondary | ICD-10-CM | POA: Diagnosis not present

## 2022-12-13 DIAGNOSIS — I480 Paroxysmal atrial fibrillation: Secondary | ICD-10-CM | POA: Diagnosis not present

## 2022-12-13 DIAGNOSIS — I10 Essential (primary) hypertension: Secondary | ICD-10-CM

## 2022-12-13 DIAGNOSIS — G63 Polyneuropathy in diseases classified elsewhere: Secondary | ICD-10-CM

## 2022-12-13 DIAGNOSIS — E039 Hypothyroidism, unspecified: Secondary | ICD-10-CM

## 2022-12-13 LAB — CBC WITH DIFFERENTIAL/PLATELET
Basophils Absolute: 0.1 10*3/uL (ref 0.0–0.1)
Basophils Relative: 1.4 % (ref 0.0–3.0)
Eosinophils Absolute: 0.2 10*3/uL (ref 0.0–0.7)
Eosinophils Relative: 3.8 % (ref 0.0–5.0)
HCT: 36.6 % (ref 36.0–46.0)
Hemoglobin: 11.9 g/dL — ABNORMAL LOW (ref 12.0–15.0)
Lymphocytes Relative: 37.7 % (ref 12.0–46.0)
Lymphs Abs: 1.7 10*3/uL (ref 0.7–4.0)
MCHC: 32.4 g/dL (ref 30.0–36.0)
MCV: 87.1 fl (ref 78.0–100.0)
Monocytes Absolute: 0.4 10*3/uL (ref 0.1–1.0)
Monocytes Relative: 8.9 % (ref 3.0–12.0)
Neutro Abs: 2.1 10*3/uL (ref 1.4–7.7)
Neutrophils Relative %: 48.2 % (ref 43.0–77.0)
Platelets: 208 10*3/uL (ref 150.0–400.0)
RBC: 4.21 Mil/uL (ref 3.87–5.11)
RDW: 13.6 % (ref 11.5–15.5)
WBC: 4.4 10*3/uL (ref 4.0–10.5)

## 2022-12-13 LAB — BASIC METABOLIC PANEL
BUN: 24 mg/dL — ABNORMAL HIGH (ref 6–23)
CO2: 28 mEq/L (ref 19–32)
Calcium: 10.1 mg/dL (ref 8.4–10.5)
Chloride: 104 mEq/L (ref 96–112)
Creatinine, Ser: 1.1 mg/dL (ref 0.40–1.20)
GFR: 45.41 mL/min — ABNORMAL LOW (ref >60.00)
Glucose, Bld: 100 mg/dL — ABNORMAL HIGH (ref 70–99)
Potassium: 4.9 mEq/L (ref 3.5–5.1)
Sodium: 140 mEq/L (ref 135–145)

## 2022-12-13 LAB — TSH: TSH: 3.75 u[IU]/mL (ref 0.35–5.50)

## 2022-12-13 NOTE — Patient Instructions (Signed)
Hypertension, Adult High blood pressure (hypertension) is when the force of blood pumping through the arteries is too strong. The arteries are the blood vessels that carry blood from the heart throughout the body. Hypertension forces the heart to work harder to pump blood and may cause arteries to become narrow or stiff. Untreated or uncontrolled hypertension can lead to a heart attack, heart failure, a stroke, kidney disease, and other problems. A blood pressure reading consists of a higher number over a lower number. Ideally, your blood pressure should be below 120/80. The first ("top") number is called the systolic pressure. It is a measure of the pressure in your arteries as your heart beats. The second ("bottom") number is called the diastolic pressure. It is a measure of the pressure in your arteries as the heart relaxes. What are the causes? The exact cause of this condition is not known. There are some conditions that result in high blood pressure. What increases the risk? Certain factors may make you more likely to develop high blood pressure. Some of these risk factors are under your control, including: Smoking. Not getting enough exercise or physical activity. Being overweight. Having too much fat, sugar, calories, or salt (sodium) in your diet. Drinking too much alcohol. Other risk factors include: Having a personal history of heart disease, diabetes, high cholesterol, or kidney disease. Stress. Having a family history of high blood pressure and high cholesterol. Having obstructive sleep apnea. Age. The risk increases with age. What are the signs or symptoms? High blood pressure may not cause symptoms. Very high blood pressure (hypertensive crisis) may cause: Headache. Fast or irregular heartbeats (palpitations). Shortness of breath. Nosebleed. Nausea and vomiting. Vision changes. Severe chest pain, dizziness, and seizures. How is this diagnosed? This condition is diagnosed by  measuring your blood pressure while you are seated, with your arm resting on a flat surface, your legs uncrossed, and your feet flat on the floor. The cuff of the blood pressure monitor will be placed directly against the skin of your upper arm at the level of your heart. Blood pressure should be measured at least twice using the same arm. Certain conditions can cause a difference in blood pressure between your right and left arms. If you have a high blood pressure reading during one visit or you have normal blood pressure with other risk factors, you may be asked to: Return on a different day to have your blood pressure checked again. Monitor your blood pressure at home for 1 week or longer. If you are diagnosed with hypertension, you may have other blood or imaging tests to help your health care provider understand your overall risk for other conditions. How is this treated? This condition is treated by making healthy lifestyle changes, such as eating healthy foods, exercising more, and reducing your alcohol intake. You may be referred for counseling on a healthy diet and physical activity. Your health care provider may prescribe medicine if lifestyle changes are not enough to get your blood pressure under control and if: Your systolic blood pressure is above 130. Your diastolic blood pressure is above 80. Your personal target blood pressure may vary depending on your medical conditions, your age, and other factors. Follow these instructions at home: Eating and drinking  Eat a diet that is high in fiber and potassium, and low in sodium, added sugar, and fat. An example of this eating plan is called the DASH diet. DASH stands for Dietary Approaches to Stop Hypertension. To eat this way: Eat   plenty of fresh fruits and vegetables. Try to fill one half of your plate at each meal with fruits and vegetables. Eat whole grains, such as whole-wheat pasta, brown rice, or whole-grain bread. Fill about one  fourth of your plate with whole grains. Eat or drink low-fat dairy products, such as skim milk or low-fat yogurt. Avoid fatty cuts of meat, processed or cured meats, and poultry with skin. Fill about one fourth of your plate with lean proteins, such as fish, chicken without skin, beans, eggs, or tofu. Avoid pre-made and processed foods. These tend to be higher in sodium, added sugar, and fat. Reduce your daily sodium intake. Many people with hypertension should eat less than 1,500 mg of sodium a day. Do not drink alcohol if: Your health care provider tells you not to drink. You are pregnant, may be pregnant, or are planning to become pregnant. If you drink alcohol: Limit how much you have to: 0-1 drink a day for women. 0-2 drinks a day for men. Know how much alcohol is in your drink. In the [place] one drink equals one 12 oz bottle of beer (355 mL), one 5 oz glass of wine (148 mL), or one 1 oz glass of hard liquor (44 mL). Lifestyle  Work with your health care provider to maintain a healthy body weight or to lose weight. Ask what an ideal weight is for you. Get at least 30 minutes of exercise that causes your heart to beat faster (aerobic exercise) most days of the week. Activities may include walking, swimming, or biking. Include exercise to strengthen your muscles (resistance exercise), such as Pilates or lifting weights, as part of your weekly exercise routine. Try to do these types of exercises for 30 minutes at least 3 days a week. Do not use any products that contain nicotine or tobacco. These products include cigarettes, chewing tobacco, and vaping devices, such as e-cigarettes. If you need help quitting, ask your health care provider. Monitor your blood pressure at home as told by your health care provider. Keep all follow-up visits. This is important. Medicines Take over-the-counter and prescription medicines only as told by your health care provider. Follow directions carefully. Blood  pressure medicines must be taken as prescribed. Do not skip doses of blood pressure medicine. Doing this puts you at risk for problems and can make the medicine less effective. Ask your health care provider about side effects or reactions to medicines that you should watch for. Contact a health care provider if you: Think you are having a reaction to a medicine you are taking. Have headaches that keep coming back (recurring). Feel dizzy. Have swelling in your ankles. Have trouble with your vision. Get help right away if you: Develop a severe headache or confusion. Have unusual weakness or numbness. Feel faint. Have severe pain in your chest or abdomen. Vomit repeatedly. Have trouble breathing. These symptoms may be an emergency. Get help right away. Call 911. Do not wait to see if the symptoms will go away. Do not drive yourself to the hospital. Summary Hypertension is when the force of blood pumping through your arteries is too strong. If this condition is not controlled, it may put you at risk for serious complications. Your personal target blood pressure may vary depending on your medical conditions, your age, and other factors. For most people, a normal blood pressure is less than 120/80. Hypertension is treated with lifestyle changes, medicines, or a combination of both. Lifestyle changes include losing weight, eating a healthy,   low-sodium diet, exercising more, and limiting alcohol. This information is not intended to replace advice given to you by your health care provider. Make sure you discuss any questions you have with your health care provider. Document Revised: 04/21/2021 Document Reviewed: 04/21/2021 Elsevier Patient Education  2024 Elsevier Inc.  

## 2022-12-13 NOTE — Progress Notes (Unsigned)
Subjective:  Patient ID: Claudia [name], female    DOB: 12-19-1933  Age: 89 y.o. MRN: [id number]  CC: No chief complaint on file.   HPI CHERRY CANLAS presents for ***  Discussed the use of AI scribe software for clinical note transcription with the patient, who gave verbal consent to proceed.  History of Present Illness   The patient, with a history of fluid retention, presents with concerns about frequent urination, up to 12-14 times a day, after taking prescribed water pills. They report that this frequency is disruptive, especially when they are out of the house. They also note occasional leg and feet swelling, which was slightly worse on the day of the consultation. However, they deny any shortness of breath, weakness, dizziness, or lightheadedness.  The patient also reports a decrease in water intake, not due to a conscious decision, but rather a lack of desire or taste for it. They have been consuming a significant amount of iced tea instead. Despite this change, they deny any nausea or vomiting.  In terms of weight, the patient has experienced a significant decrease from over 200 pounds to 158 pounds. However, their weight has remained stable since the last visit.  The patient also mentions a dry mouth upon waking, which they question may be due to decreased water intake. They have been taking B12 liquid at home every other day for the past six weeks and inquire about the need for a B12 shot. They also deny any constipation.       Outpatient Medications Prior to Visit  Medication Sig Dispense Refill   Cholecalciferol 50 MCG (2000 UT) TABS Take 1 tablet (2,000 Units total) by mouth daily. 90 tablet 0   diltiazem (CARDIZEM) 30 MG tablet TAKE ONE TABLET EVERY 4 HOURS AS NEEDED FOR afib heart rate greater THAN 100 aslong as blood pressure less THAN 100 90 tablet 1   HYDROcodone-acetaminophen (NORCO/VICODIN) 5-325 MG tablet TAKE ONE TABLET BY MOUTH every SIX hours AS NEEDED FOR  pain 90 tablet 0   linaclotide (LINZESS) 290 MCG CAPS capsule Take 1 capsule (290 mcg total) by mouth daily before breakfast. 90 capsule 1   torsemide (DEMADEX) 20 MG tablet TAKE ONE TABLET BY MOUTH ONCE DAILY 90 tablet 1   vitamin C (ASCORBIC ACID) 500 MG tablet Take 500 mg by mouth daily.     XARELTO 15 MG TABS tablet Take 1 tablet (15 mg total) by mouth daily with supper. 30 tablet 5   Facility-Administered Medications Prior to Visit  Medication Dose Route Frequency Provider Last Rate Last Admin   cyanocobalamin (VITAMIN B12) injection 1,000 mcg  1,000 mcg Intramuscular Q30 days Myrlene Broker, MD   1,000 mcg at 02/22/22 1414    ROS Review of Systems  Objective:  BP 138/66 (BP Location: Left Arm, Patient Position: Sitting, Cuff Size: Large)   Pulse (!) 54   Temp 98.2 F (36.8 C) (Oral)   Ht 5\' 9"  (1.753 m)   Wt 159 lb (72.1 kg)   SpO2 96%   BMI 23.48 kg/m   BP Readings from Last 3 Encounters:  12/13/22 138/66  09/27/22 132/70  09/13/22 132/64    Wt Readings from Last 3 Encounters:  12/13/22 159 lb (72.1 kg)  11/04/22 158 lb (71.7 kg)  09/27/22 158 lb (71.7 kg)    Physical Exam Cardiovascular:     Rate and Rhythm: Normal rate and regular rhythm.  Musculoskeletal:     Right lower leg: 1+ Pitting Edema  present.     Left lower leg: 1+ Pitting Edema present.     Lab Results  Component Value Date   WBC 4.4 06/14/2022   HGB 12.0 06/14/2022   HCT 35.8 (L) 06/14/2022   PLT 215.0 06/14/2022   GLUCOSE 99 06/14/2022   CHOL 148 05/05/2021   TRIG 85.0 05/05/2021   HDL 54.40 05/05/2021   LDLDIRECT 158.9 09/05/2012   LDLCALC 77 05/05/2021   ALT 11 10/19/2021   AST 14 (L) 10/19/2021   NA 142 06/14/2022   K 4.3 06/14/2022   CL 106 06/14/2022   CREATININE 1.05 06/14/2022   BUN 23 06/14/2022   CO2 28 06/14/2022   TSH 3.94 06/14/2022   INR 1.00 04/04/2017   HGBA1C 5.5 11/03/2020    DG Cervical Spine 2 or 3 views  Result Date: 10/19/2021 CLINICAL DATA:   ACDF EXAM: CERVICAL SPINE - 2-3 VIEW COMPARISON:  Radiograph 07/28/2021 FINDINGS: Intraoperative images during C3-C4, C4-C5, and C5-C6 ACDF. Intact hardware without evidence of immediate complication. IMPRESSION: Intraoperative images during C3-C6 ACDF. No evidence of immediate complication. Electronically Signed   By: Caprice Renshaw M.D.   On: 10/19/2021 11:27   DG C-Arm 1-60 Min-No Report  Result Date: 10/19/2021 Fluoroscopy was utilized by the requesting physician.  No radiographic interpretation.   DG C-Arm 1-60 Min-No Report  Result Date: 10/19/2021 Fluoroscopy was utilized by the requesting physician.  No radiographic interpretation.   DG C-Arm 1-60 Min-No Report  Result Date: 10/19/2021 Fluoroscopy was utilized by the requesting physician.  No radiographic interpretation.    Assessment & Plan:  Essential hypertension, benign -     Basic metabolic panel; Future -     CBC with Differential/Platelet; Future -     TSH; Future  Acquired hypothyroidism -     TSH; Future  Stage 3b chronic kidney disease (HCC) -     Basic metabolic panel; Future  Vitamin B12 deficiency neuropathy (HCC) -     CBC with Differential/Platelet; Future     Follow-up: No follow-ups on file.  Sanda Linger, MD

## 2022-12-17 ENCOUNTER — Telehealth: Payer: Self-pay | Admitting: Internal Medicine

## 2022-12-17 NOTE — Telephone Encounter (Signed)
Pt called wanting to know if Dr. Yetta Barre is still going to prescribe B12 pills and also was looking at her MyChart results and it stated she is enemic and was wondering if he's  going to prescribe some medication for that. Please advise.

## 2022-12-18 ENCOUNTER — Other Ambulatory Visit: Payer: Self-pay | Admitting: Internal Medicine

## 2022-12-18 DIAGNOSIS — G63 Polyneuropathy in diseases classified elsewhere: Secondary | ICD-10-CM

## 2022-12-18 MED ORDER — CYANOCOBALAMIN 2000 MCG PO TABS
2000.0000 ug | ORAL_TABLET | Freq: Every day | ORAL | 1 refills | Status: DC
Start: 2022-12-18 — End: 2022-12-22

## 2022-12-22 ENCOUNTER — Other Ambulatory Visit: Payer: Self-pay

## 2022-12-22 DIAGNOSIS — E538 Deficiency of other specified B group vitamins: Secondary | ICD-10-CM

## 2022-12-22 MED ORDER — CYANOCOBALAMIN 2000 MCG PO TABS
2000.0000 ug | ORAL_TABLET | Freq: Every day | ORAL | 1 refills | Status: AC
Start: 2022-12-22 — End: ?

## 2022-12-22 NOTE — Telephone Encounter (Signed)
B12 has been prescribed

## 2022-12-22 NOTE — Telephone Encounter (Signed)
Patient would like to know what Dr. Yetta Barre would like to do about her anemia. She wants to know if he plans to prescribe anything to help. Patient would like a call back at [phone].

## 2022-12-22 NOTE — Telephone Encounter (Signed)
Pt has been informed.   She requested B12 be sent to [facility] as they deliver.

## 2023-01-24 ENCOUNTER — Other Ambulatory Visit: Payer: Self-pay | Admitting: Internal Medicine

## 2023-01-24 DIAGNOSIS — M170 Bilateral primary osteoarthritis of knee: Secondary | ICD-10-CM

## 2023-01-24 DIAGNOSIS — M47817 Spondylosis without myelopathy or radiculopathy, lumbosacral region: Secondary | ICD-10-CM

## 2023-01-24 DIAGNOSIS — M961 Postlaminectomy syndrome, not elsewhere classified: Secondary | ICD-10-CM

## 2023-01-25 ENCOUNTER — Other Ambulatory Visit: Payer: Self-pay | Admitting: Internal Medicine

## 2023-01-25 DIAGNOSIS — M47817 Spondylosis without myelopathy or radiculopathy, lumbosacral region: Secondary | ICD-10-CM

## 2023-01-25 DIAGNOSIS — M170 Bilateral primary osteoarthritis of knee: Secondary | ICD-10-CM

## 2023-01-25 DIAGNOSIS — M961 Postlaminectomy syndrome, not elsewhere classified: Secondary | ICD-10-CM

## 2023-01-25 MED ORDER — HYDROCODONE-ACETAMINOPHEN 5-325 MG PO TABS
1.0000 | ORAL_TABLET | Freq: Four times a day (QID) | ORAL | 0 refills | Status: DC | PRN
Start: 2023-01-25 — End: 2023-02-01

## 2023-01-27 ENCOUNTER — Telehealth: Payer: Self-pay | Admitting: Internal Medicine

## 2023-01-27 NOTE — Telephone Encounter (Signed)
Patient thinks that the B12 pills are giving her diarrhea.  She feels weak all the time.  Patient want's to know if she should go back on the shots.  Please advise.  [phone]

## 2023-01-31 ENCOUNTER — Telehealth: Payer: Self-pay | Admitting: Internal Medicine

## 2023-01-31 NOTE — Telephone Encounter (Signed)
[facility] does not have the hydrocodone 5-325 - they have the 10-325 she wants to know if you can send this in for her.

## 2023-02-01 ENCOUNTER — Other Ambulatory Visit: Payer: Self-pay | Admitting: Internal Medicine

## 2023-02-01 DIAGNOSIS — G603 Idiopathic progressive neuropathy: Secondary | ICD-10-CM

## 2023-02-01 DIAGNOSIS — M4802 Spinal stenosis, cervical region: Secondary | ICD-10-CM

## 2023-02-01 DIAGNOSIS — M961 Postlaminectomy syndrome, not elsewhere classified: Secondary | ICD-10-CM

## 2023-02-01 DIAGNOSIS — M170 Bilateral primary osteoarthritis of knee: Secondary | ICD-10-CM

## 2023-02-01 DIAGNOSIS — M47817 Spondylosis without myelopathy or radiculopathy, lumbosacral region: Secondary | ICD-10-CM

## 2023-02-01 MED ORDER — HYDROCODONE-ACETAMINOPHEN 10-325 MG PO TABS
1.0000 | ORAL_TABLET | Freq: Three times a day (TID) | ORAL | 0 refills | Status: DC | PRN
Start: 2023-02-01 — End: 2023-03-02

## 2023-03-01 ENCOUNTER — Telehealth: Payer: Self-pay | Admitting: Internal Medicine

## 2023-03-01 DIAGNOSIS — K5904 Chronic idiopathic constipation: Secondary | ICD-10-CM

## 2023-03-01 MED ORDER — LINACLOTIDE 290 MCG PO CAPS
290.0000 ug | ORAL_CAPSULE | Freq: Every day | ORAL | 0 refills | Status: DC
Start: 2023-03-01 — End: 2023-03-04

## 2023-03-01 NOTE — Telephone Encounter (Signed)
Prescription Request  03/01/2023  LOV: 12/13/2022  What is the name of the medication or equipment? linaclotide (LINZESS) 290 MCG CAPS capsule  Have you contacted your pharmacy to request a refill? Yes   Which pharmacy would you like this sent to?   [facility] at [address], [place], [place] [address]    Patient notified that their request is being sent to the clinical staff for review and that they should receive a response within 2 business days.   Please advise at [facility] [phone]

## 2023-03-02 ENCOUNTER — Telehealth: Payer: Self-pay | Admitting: Internal Medicine

## 2023-03-02 ENCOUNTER — Other Ambulatory Visit: Payer: Self-pay | Admitting: Internal Medicine

## 2023-03-02 DIAGNOSIS — M47817 Spondylosis without myelopathy or radiculopathy, lumbosacral region: Secondary | ICD-10-CM

## 2023-03-02 DIAGNOSIS — M170 Bilateral primary osteoarthritis of knee: Secondary | ICD-10-CM

## 2023-03-02 DIAGNOSIS — G603 Idiopathic progressive neuropathy: Secondary | ICD-10-CM

## 2023-03-02 DIAGNOSIS — I480 Paroxysmal atrial fibrillation: Secondary | ICD-10-CM

## 2023-03-02 DIAGNOSIS — M4802 Spinal stenosis, cervical region: Secondary | ICD-10-CM

## 2023-03-02 DIAGNOSIS — M961 Postlaminectomy syndrome, not elsewhere classified: Secondary | ICD-10-CM

## 2023-03-02 MED ORDER — RIVAROXABAN 15 MG PO TABS
15.0000 mg | ORAL_TABLET | Freq: Every day | ORAL | 3 refills | Status: DC
Start: 2023-03-02 — End: 2023-05-10

## 2023-03-02 MED ORDER — HYDROCODONE-ACETAMINOPHEN 10-325 MG PO TABS: 1.0000 | ORAL_TABLET | Freq: Three times a day (TID) | ORAL | 0 refills | Status: DC | PRN

## 2023-03-02 NOTE — Telephone Encounter (Signed)
Patient is changing pharmacies because [facility] is closing.   Prescription Request  03/02/2023  LOV: 12/13/2022  What is the name of the medication or equipment?  HYDROcodone-acetaminophen (NORCO) 10-325 MG tablet  XARELTO 15 MG TABS tablet  Have you contacted your pharmacy to request a refill? No   Which pharmacy would you like this sent to?  [facility] [phone] - [place], [place] - [address] AT [facility] [address] [place] [address] Phone: [phone] Fax: [phone]    Patient notified that their request is being sent to the clinical staff for review and that they should receive a response within 2 business days.   Please advise at Mobile [phone] (mobile)

## 2023-03-04 ENCOUNTER — Other Ambulatory Visit: Payer: Self-pay | Admitting: Internal Medicine

## 2023-03-04 DIAGNOSIS — K5904 Chronic idiopathic constipation: Secondary | ICD-10-CM

## 2023-03-29 ENCOUNTER — Encounter: Payer: Self-pay | Admitting: Orthopaedic Surgery

## 2023-03-29 ENCOUNTER — Other Ambulatory Visit (INDEPENDENT_AMBULATORY_CARE_PROVIDER_SITE_OTHER): Payer: Self-pay

## 2023-03-29 ENCOUNTER — Ambulatory Visit: Payer: HMO | Admitting: Orthopaedic Surgery

## 2023-03-29 VITALS — BP 130/72 | HR 60

## 2023-03-29 DIAGNOSIS — Z981 Arthrodesis status: Secondary | ICD-10-CM

## 2023-03-29 NOTE — Progress Notes (Signed)
Office Visit Note   Patient: Claudia [name]           Date of Birth: 12/29/1933           MRN: [id number] Visit Date: 03/29/2023              Requested by: Etta Grandchild, MD [address] [place],  [place] [address] PCP: Etta Grandchild, MD   Assessment & Plan: Visit Diagnoses:  1. S/P cervical spinal fusion     Plan: Prescription for folding walker 5 inch front wheels adjustable prescribed.  She can follow-up on an as-needed basis.  Follow-Up Instructions: No follow-ups on file.   Orders:  Orders Placed This Encounter  Procedures   XR Cervical Spine 2 or 3 views   No orders of the defined types were placed in this encounter.     Procedures: No procedures performed   Clinical Data: No additional findings.   Subjective: Chief Complaint  Patient presents with   Neck - Follow-up    HPI 89 year old female now a year and a half out from three-level cervical fusion principal problem is a folding walker she borrowed from a friend is too short.  She purchased a rollator on her own and wants a folding walker that is taller so she can use it when she goes out in the community she drove here today.  She is finished therapy and is noting every month she is walking a little bit better.  She had severe stenosis with cord compression at 3 levels.  Previous meningioma peripheral neuropathy atrial fibs previous lumbar decompression stage III kidney disease and Norco 10/325 pain management 3 tablets daily times several years.  Review of Systems all systems noncontributory to HPI.   Objective: Vital Signs: BP 130/72   Pulse 60   Physical Exam Constitutional:      Appearance: She is well-developed.  HENT:     Head: Normocephalic.     Right Ear: External ear normal.     Left Ear: External ear normal. There is no impacted cerumen.  Eyes:     Pupils: Pupils are equal, round, and reactive to light.  Neck:     Thyroid: No thyromegaly.     Trachea: No tracheal  deviation.  Cardiovascular:     Rate and Rhythm: Normal rate.  Pulmonary:     Effort: Pulmonary effort is normal.  Abdominal:     Palpations: Abdomen is soft.  Musculoskeletal:     Cervical back: No rigidity.  Skin:    General: Skin is warm and dry.  Neurological:     Mental Status: She is alert and oriented to person, place, and time.  Psychiatric:        Behavior: Behavior normal.     Ortho Exam patient moving all extremities came in a wheelchair from the front desk.  She ambulates in the house on the community and drove today.  Specialty Comments:  No specialty comments available.  Imaging: No results found.   PMFS History: Patient Active Problem List   Diagnosis Date Noted   Vitamin D deficiency disease 06/21/2022   Encounter for general adult medical examination with abnormal findings 06/21/2022   Vitamin B12 deficiency neuropathy (HCC) 02/15/2022   Cervical stricture or stenosis 10/20/2021   Cervical spinal stenosis 10/19/2021   Meningioma (HCC) 08/24/2021   PAF (paroxysmal atrial fibrillation) (HCC) 11/03/2020   Stage 3b chronic kidney disease (HCC) 05/06/2020   Spinal stenosis in cervical region 05/12/2016  Therapeutic opioid-induced constipation (OIC) 01/01/2016   Chronic idiopathic constipation 09/11/2013   Lumbosacral spondylosis without myelopathy 04/09/2013   Postlaminectomy syndrome, lumbar region 04/09/2013   Anticoagulation management encounter 02/01/2012   Neuropathy, peripheral 08/04/2011   Pure hypercholesterolemia 08/04/2011   Essential hypertension, benign 08/04/2011   Hypothyroidism 08/04/2011   DJD (degenerative joint disease) of knee 08/04/2011   Gout 08/04/2011   Past Medical History:  Diagnosis Date   Abnormality of gait    Brachial neuritis or radiculitis NOS    Degeneration of lumbar or lumbosacral intervertebral disc    Dysrhythmia    Essential hypertension, benign    Gouty arthropathy    Lumbago    Obesity    Osteoarthritis     Pure hypercholesterolemia    Unspecified hypothyroidism     Family History  Problem Relation Age of Onset   Heart disease Father    Heart attack Father    Hypertension Mother    Alzheimer's disease Mother    Diabetes Sister    Cirrhosis Brother    Diabetes Sister    Diabetes Sister    Diabetes Brother    Heart attack Brother    Cancer Neg Hx    Kidney disease Neg Hx     Past Surgical History:  Procedure Laterality Date   ABDOMINAL HYSTERECTOMY  03/13/2010   ANTERIOR CERVICAL DECOMP/DISCECTOMY FUSION N/A 10/19/2021   Procedure: C3-4, C4-5, C5-6 ANTERIOR CERVICAL DISCECTOMY FUSION, ALLOGRAFT, PLATE;  Surgeon: Eldred Manges, MD;  Location: [facility] OR;  Service: Orthopedics;  Laterality: N/A;   BACK SURGERY     LUMBAR LAMINECTOMY  03/13/2010   right knee replacement  03/13/2010   Social History   Occupational History   Not on file  Tobacco Use   Smoking status: Never   Smokeless tobacco: Never  Vaping Use   Vaping status: Never Used  Substance and Sexual Activity   Alcohol use: Yes    Comment: 1 glass wine/month   Drug use: No   Sexual activity: Never

## 2023-03-31 ENCOUNTER — Other Ambulatory Visit: Payer: Self-pay | Admitting: Internal Medicine

## 2023-03-31 ENCOUNTER — Telehealth: Payer: Self-pay | Admitting: Internal Medicine

## 2023-03-31 DIAGNOSIS — M961 Postlaminectomy syndrome, not elsewhere classified: Secondary | ICD-10-CM

## 2023-03-31 DIAGNOSIS — G603 Idiopathic progressive neuropathy: Secondary | ICD-10-CM

## 2023-03-31 DIAGNOSIS — M170 Bilateral primary osteoarthritis of knee: Secondary | ICD-10-CM

## 2023-03-31 DIAGNOSIS — M4802 Spinal stenosis, cervical region: Secondary | ICD-10-CM

## 2023-03-31 DIAGNOSIS — M47817 Spondylosis without myelopathy or radiculopathy, lumbosacral region: Secondary | ICD-10-CM

## 2023-03-31 MED ORDER — HYDROCODONE-ACETAMINOPHEN 10-325 MG PO TABS: 1.0000 | ORAL_TABLET | Freq: Three times a day (TID) | ORAL | 0 refills | Status: AC | PRN

## 2023-03-31 NOTE — Telephone Encounter (Signed)
Prescription Request  03/31/2023  LOV: 12/13/2022  What is the name of the medication or equipment? HYDROcodone-acetaminophen (NORCO) 10-325 MG tablet   Have you contacted your pharmacy to request a refill? No   Which pharmacy would you like this sent to?    [facility] [phone] - [place], [place] - [address] AT [facility] OF [address] [place] [address] Phone: [phone] Fax: [phone]  Patient notified that their request is being sent to the clinical staff for review and that they should receive a response within 2 business days.   Please advise at Mobile [phone] (mobile)

## 2023-04-06 ENCOUNTER — Telehealth: Payer: Self-pay | Admitting: Internal Medicine

## 2023-04-06 NOTE — Telephone Encounter (Signed)
Prescription Request  04/06/2023  LOV: 12/13/2022  What is the name of the medication or equipment? ducoset  Have you contacted your pharmacy to request a refill? Yes   Which pharmacy would you like this sent to?  [facility] [phone] - [place], [place] - [address] AT [facility] [address] [place] [address] Phone: [phone] Fax: [phone]   Patient notified that their request is being sent to the clinical staff for review and that they should receive a response within 2 business days.   Please advise at Mobile [phone] (mobile)

## 2023-04-06 NOTE — Telephone Encounter (Signed)
LVM to discuss. Needing clarification on what Rx is needed.   No Rx listed on her current med list by the name of "Ducoset"

## 2023-04-07 ENCOUNTER — Encounter (HOSPITAL_BASED_OUTPATIENT_CLINIC_OR_DEPARTMENT_OTHER): Payer: Self-pay

## 2023-04-07 ENCOUNTER — Telehealth: Payer: Self-pay | Admitting: Orthopaedic Surgery

## 2023-04-07 NOTE — Telephone Encounter (Signed)
Patient called and said it was a stool softener called docusate sodium. She would like to know if Dr. Yetta Barre can fill it for her. Patient would like a call back at [phone].

## 2023-04-07 NOTE — Telephone Encounter (Signed)
Patient called and needed a prescription for her laxative. [facility] is shut down so it needs to go to [facility] [address] CB#[phone]

## 2023-04-08 ENCOUNTER — Telehealth: Payer: Self-pay | Admitting: Orthopaedic Surgery

## 2023-04-08 NOTE — Telephone Encounter (Signed)
Tried calling pt back. Phone goes straight to voice mail

## 2023-04-08 NOTE — Telephone Encounter (Signed)
Patient called. Returning a call to [employer]

## 2023-04-08 NOTE — Telephone Encounter (Signed)
Pt portal message sent and also lvm

## 2023-04-11 ENCOUNTER — Telehealth: Payer: Self-pay | Admitting: Orthopaedic Surgery

## 2023-04-11 NOTE — Telephone Encounter (Signed)
Pt called in stating she missed call and would like CB

## 2023-04-12 ENCOUNTER — Telehealth: Payer: Self-pay | Admitting: Orthopaedic Surgery

## 2023-04-12 NOTE — Telephone Encounter (Signed)
Patient called back returning your call. She did say that her phone will ring 4 times and then go straight to VM. CB#[phone]

## 2023-04-13 NOTE — Telephone Encounter (Signed)
I lvm for pt to cb to find out what she needs

## 2023-04-13 NOTE — Telephone Encounter (Signed)
Pt returned call. She stated the docusate sodium (COLACE) capsule 100 mg is too expense over the counter and wants a rx [facility] so she can get it covered by insurance. Please advise

## 2023-05-09 ENCOUNTER — Ambulatory Visit: Payer: PPO | Admitting: Internal Medicine

## 2023-05-10 ENCOUNTER — Other Ambulatory Visit: Payer: Self-pay | Admitting: Internal Medicine

## 2023-05-10 ENCOUNTER — Telehealth: Payer: Self-pay | Admitting: Cardiovascular Disease

## 2023-05-10 ENCOUNTER — Telehealth: Payer: Self-pay | Admitting: Internal Medicine

## 2023-05-10 DIAGNOSIS — M4802 Spinal stenosis, cervical region: Secondary | ICD-10-CM

## 2023-05-10 DIAGNOSIS — M961 Postlaminectomy syndrome, not elsewhere classified: Secondary | ICD-10-CM

## 2023-05-10 DIAGNOSIS — M170 Bilateral primary osteoarthritis of knee: Secondary | ICD-10-CM

## 2023-05-10 DIAGNOSIS — N882 Stricture and stenosis of cervix uteri: Secondary | ICD-10-CM

## 2023-05-10 DIAGNOSIS — M47817 Spondylosis without myelopathy or radiculopathy, lumbosacral region: Secondary | ICD-10-CM

## 2023-05-10 DIAGNOSIS — I480 Paroxysmal atrial fibrillation: Secondary | ICD-10-CM

## 2023-05-10 MED ORDER — RIVAROXABAN 15 MG PO TABS: 15.0000 mg | ORAL_TABLET | Freq: Every day | ORAL | 1 refills | Status: DC

## 2023-05-10 MED ORDER — HYDROCODONE-ACETAMINOPHEN 5-325 MG PO TABS
1.0000 | ORAL_TABLET | Freq: Three times a day (TID) | ORAL | 0 refills | Status: DC | PRN
Start: 2023-05-10 — End: 2023-06-07

## 2023-05-10 NOTE — Telephone Encounter (Signed)
Please advise 

## 2023-05-10 NOTE — Telephone Encounter (Signed)
*  STAT* If patient is at the pharmacy, call can be transferred to refill team.   1. Which medications need to be refilled? (please list name of each medication and dose if known)   Rivaroxaban (XARELTO) 15 MG TABS tablet      4. Which pharmacy/location (including street and city if local pharmacy) is medication to be sent to? [id number] - [place], [place] - [address] AT [address]     5. Do they need a 30 day or 90 day supply? 90

## 2023-05-10 NOTE — Telephone Encounter (Signed)
Patient called to request a refill of her HYDROcodone-acetaminophen (NORCO/VICODIN) 5-325 MG tablet , but it is no longer on her medication list. Is she to no longer take it? If possible, she would like it sent to [id number] - [place], [place] - [address] AT [address] Patient has an appointment scheduled on 05/16/2023.

## 2023-05-10 NOTE — Telephone Encounter (Signed)
Prescription refill request for Xarelto received.  Indication:  Afib  Last office visit: 09/13/22 Claudia [name])  Weight: 72.1kg Age: 89 Scr: 1.10 (12/13/22)  CrCl: 41.69ml/min  Appropriate dose. Refill sent.

## 2023-05-10 NOTE — Telephone Encounter (Signed)
Xarelto refill

## 2023-05-16 ENCOUNTER — Encounter: Payer: Self-pay | Admitting: Internal Medicine

## 2023-05-16 ENCOUNTER — Ambulatory Visit (INDEPENDENT_AMBULATORY_CARE_PROVIDER_SITE_OTHER): Payer: HMO

## 2023-05-16 ENCOUNTER — Ambulatory Visit (INDEPENDENT_AMBULATORY_CARE_PROVIDER_SITE_OTHER): Payer: HMO | Admitting: Internal Medicine

## 2023-05-16 VITALS — BP 136/76 | HR 60 | Temp 98.0°F | Resp 16 | Ht 69.0 in | Wt 156.8 lb

## 2023-05-16 DIAGNOSIS — I10 Essential (primary) hypertension: Secondary | ICD-10-CM | POA: Diagnosis not present

## 2023-05-16 DIAGNOSIS — R042 Hemoptysis: Secondary | ICD-10-CM

## 2023-05-16 DIAGNOSIS — K5904 Chronic idiopathic constipation: Secondary | ICD-10-CM | POA: Diagnosis not present

## 2023-05-16 LAB — HEPATIC FUNCTION PANEL
ALT: 7 U/L (ref 0–35)
AST: 15 U/L (ref 0–37)
Albumin: 4.3 g/dL (ref 3.5–5.2)
Alkaline Phosphatase: 64 U/L (ref 39–117)
Bilirubin, Direct: 0.1 mg/dL (ref 0.0–0.3)
Total Bilirubin: 0.8 mg/dL (ref 0.2–1.2)
Total Protein: 6.9 g/dL (ref 6.0–8.3)

## 2023-05-16 LAB — CBC WITH DIFFERENTIAL/PLATELET
Basophils Absolute: 0 10*3/uL (ref 0.0–0.1)
Basophils Relative: 0.6 % (ref 0.0–3.0)
Eosinophils Absolute: 0.1 10*3/uL (ref 0.0–0.7)
Eosinophils Relative: 3.3 % (ref 0.0–5.0)
HCT: 37.2 % (ref 36.0–46.0)
Hemoglobin: 12.2 g/dL (ref 12.0–15.0)
Lymphocytes Relative: 40.2 % (ref 12.0–46.0)
Lymphs Abs: 1.8 10*3/uL (ref 0.7–4.0)
MCHC: 32.8 g/dL (ref 30.0–36.0)
MCV: 88 fL (ref 78.0–100.0)
Monocytes Absolute: 0.4 10*3/uL (ref 0.1–1.0)
Monocytes Relative: 9.8 % (ref 3.0–12.0)
Neutro Abs: 2.1 10*3/uL (ref 1.4–7.7)
Neutrophils Relative %: 46.1 % (ref 43.0–77.0)
Platelets: 207 10*3/uL (ref 150.0–400.0)
RBC: 4.23 Mil/uL (ref 3.87–5.11)
RDW: 14.3 % (ref 11.5–15.5)
WBC: 4.6 10*3/uL (ref 4.0–10.5)

## 2023-05-16 LAB — BASIC METABOLIC PANEL
BUN: 21 mg/dL (ref 6–23)
CO2: 29 meq/L (ref 19–32)
Calcium: 10.1 mg/dL (ref 8.4–10.5)
Chloride: 104 meq/L (ref 96–112)
Creatinine, Ser: 1.04 mg/dL (ref 0.40–1.20)
GFR: 48.43 mL/min — ABNORMAL LOW (ref >60.00)
Glucose, Bld: 93 mg/dL (ref 70–99)
Potassium: 5 meq/L (ref 3.5–5.1)
Sodium: 141 meq/L (ref 135–145)

## 2023-05-16 LAB — APTT: aPTT: 32.4 s (ref 25.4–36.8)

## 2023-05-16 LAB — PROTIME-INR
INR: 1.2 {ratio} — ABNORMAL HIGH (ref 0.8–1.0)
Prothrombin Time: 12.2 s (ref 9.6–13.1)

## 2023-05-16 MED ORDER — LINACLOTIDE 290 MCG PO CAPS
290.0000 ug | ORAL_CAPSULE | Freq: Every day | ORAL | 1 refills | Status: DC
Start: 2023-05-16 — End: 2024-01-10

## 2023-05-16 NOTE — Progress Notes (Signed)
Subjective:  Patient ID: Claudia [name], female    DOB: [birth year]  Age: 89 y.o. MRN: [id number]  CC: Hypertension and Cough   HPI Claudia [name] presents for f/up -----  Discussed the use of AI scribe software for clinical note transcription with the patient, who gave verbal consent to proceed.  History of Present Illness   The patient, in her eighties, presents with a chief complaint of phlegm in the throat and occasional hemoptysis. She reports that the phlegm has been persistent since a recent throat operation and that she coughed up a small amount of blood about a week ago. She denies any symptoms of high blood pressure such as headache, blurred vision, chest pain, or shortness of breath. She also denies any fevers, chills, or night sweats.  The patient has been feeling well overall, maintaining her weight despite eating as much as she wants. She has received her flu shot and COVID vaccination. She reports no issues with bowel movements but mentions that her prescriptions got crossed when she switched from [place], specifically needing a new prescription for Linzess.  The patient has a history of smoking in her twenties but has not smoked for over sixty years. She does not drink alcohol. She is currently taking Torsemide for swelling in her legs, which she reports is not severe. She did not take her dose on the day of the consultation due to concerns about frequent urination when she goes out.       Outpatient Medications Prior to Visit  Medication Sig Dispense Refill   cyanocobalamin 2000 MCG tablet Take 1 tablet (2,000 mcg total) by mouth daily. 90 tablet 1   HYDROcodone-acetaminophen (NORCO/VICODIN) 5-325 MG tablet Take 1 tablet by mouth every 8 (eight) hours as needed for moderate pain (pain score 4-6). 90 tablet 0   Rivaroxaban (XARELTO) 15 MG TABS tablet Take 1 tablet (15 mg total) by mouth daily with supper. 90 tablet 1   torsemide (DEMADEX) 20 MG tablet TAKE ONE TABLET  BY MOUTH ONCE DAILY 90 tablet 1   LINZESS 290 MCG CAPS capsule TAKE ONE CAPSULE BY MOUTH ONCE DAILY BEFORE BREAKFAST 90 capsule 1   Cholecalciferol 50 MCG (2000 UT) TABS Take 1 tablet (2,000 Units total) by mouth daily. 90 tablet 0   diltiazem (CARDIZEM) 30 MG tablet TAKE ONE TABLET EVERY 4 HOURS AS NEEDED FOR afib heart rate greater THAN 100 aslong as blood pressure less THAN 100 90 tablet 1   vitamin C (ASCORBIC ACID) 500 MG tablet Take 500 mg by mouth daily.     No facility-administered medications prior to visit.    ROS Review of Systems  Constitutional: Negative.  Negative for chills, fatigue and unexpected weight change.  HENT: Negative.    Respiratory: Negative.  Negative for chest tightness, shortness of breath and wheezing.   Cardiovascular:  Negative for chest pain, palpitations and leg swelling.  Gastrointestinal:  Positive for constipation. Negative for abdominal pain, blood in stool, diarrhea, nausea and vomiting.  Genitourinary: Negative.  Negative for difficulty urinating.  Musculoskeletal:  Positive for gait problem and neck pain. Negative for arthralgias, back pain and myalgias.  Hematological:  Negative for adenopathy. Does not bruise/bleed easily.  Psychiatric/Behavioral:  Negative for dysphoric mood.     Objective:  BP 136/76 (BP Location: Left Arm, Patient Position: Sitting, Cuff Size: Normal)   Pulse 60   Temp 98 F (36.7 C) (Oral)   Resp 16   Ht 5\' 9"  (1.753 m)   Wt  156 lb 12.8 oz (71.1 kg)   SpO2 98%   BMI 23.16 kg/m   BP Readings from Last 3 Encounters:  05/16/23 136/76  03/29/23 130/72  12/13/22 138/66    Wt Readings from Last 3 Encounters:  05/16/23 156 lb 12.8 oz (71.1 kg)  12/13/22 159 lb (72.1 kg)  11/04/22 158 lb (71.7 kg)    Physical Exam Vitals reviewed.  Eyes:     General: No scleral icterus.    Conjunctiva/sclera: Conjunctivae normal.  Cardiovascular:     Rate and Rhythm: Normal rate and regular rhythm.     Heart sounds: No  murmur heard.    No gallop.  Pulmonary:     Effort: Pulmonary effort is normal.     Breath sounds: No stridor. No wheezing, rhonchi or rales.  Abdominal:     General: Abdomen is flat.     Palpations: There is no mass.     Tenderness: There is no abdominal tenderness. There is no guarding.     Hernia: No hernia is present.  Musculoskeletal:        General: Normal range of motion.     Cervical back: Neck supple.     Right lower leg: No edema.     Left lower leg: No edema.  Lymphadenopathy:     Cervical: No cervical adenopathy.  Skin:    General: Skin is warm and dry.  Neurological:     General: No focal deficit present.     Mental Status: She is alert. Mental status is at baseline.  Psychiatric:        Mood and Affect: Mood normal.        Behavior: Behavior normal.     Lab Results  Component Value Date   WBC 4.6 05/16/2023   HGB 12.2 05/16/2023   HCT 37.2 05/16/2023   PLT 207.0 05/16/2023   GLUCOSE 93 05/16/2023   CHOL 148 05/05/2021   TRIG 85.0 05/05/2021   HDL 54.40 05/05/2021   LDLDIRECT 158.9 09/05/2012   LDLCALC 77 05/05/2021   ALT 7 05/16/2023   AST 15 05/16/2023   NA 141 05/16/2023   K 5.0 05/16/2023   CL 104 05/16/2023   CREATININE 1.04 05/16/2023   BUN 21 05/16/2023   CO2 29 05/16/2023   TSH 3.75 12/13/2022   INR 1.2 (H) 05/16/2023   HGBA1C 5.5 11/03/2020    DG Cervical Spine 2 or 3 views  Result Date: 10/19/2021 CLINICAL DATA:  ACDF EXAM: CERVICAL SPINE - 2-3 VIEW COMPARISON:  Radiograph 07/28/2021 FINDINGS: Intraoperative images during C3-C4, C4-C5, and C5-C6 ACDF. Intact hardware without evidence of immediate complication. IMPRESSION: Intraoperative images during C3-C6 ACDF. No evidence of immediate complication. Electronically Signed   By: Caprice Renshaw M.D.   On: 10/19/2021 11:27   DG C-Arm 1-60 Min-No Report  Result Date: 10/19/2021 Fluoroscopy was utilized by the requesting physician.  No radiographic interpretation.   DG C-Arm 1-60 Min-No  Report  Result Date: 10/19/2021 Fluoroscopy was utilized by the requesting physician.  No radiographic interpretation.   DG C-Arm 1-60 Min-No Report  Result Date: 10/19/2021 Fluoroscopy was utilized by the requesting physician.  No radiographic interpretation.   DG Chest 2 View  Result Date: 05/16/2023 CLINICAL DATA:  Hemoptysis for 1 month. EXAM: CHEST - 2 VIEW COMPARISON:  July 01, 2021. FINDINGS: The heart size and mediastinal contours are within normal limits. Both lungs are clear. The visualized skeletal structures are unremarkable. IMPRESSION: No active cardiopulmonary disease. Electronically Signed   By: Fayrene Fearing  Christen Butter M.D.   On: 05/16/2023 16:37     Assessment & Plan:   Essential hypertension, benign- Her BP is adequately well controlled. -     Basic metabolic panel; Future -     CBC with Differential/Platelet; Future -     Hepatic function panel; Future  Chronic idiopathic constipation -     linaCLOtide; Take 1 capsule (290 mcg total) by mouth daily before breakfast.  Dispense: 90 capsule; Refill: 1 -     Basic metabolic panel; Future  Hemoptysis- The workup is reassuring. -     DG Chest 2 View; Future -     Protime-INR; Future -     APTT; Future     Follow-up: Return in about 3 months (around 08/16/2023).  Sanda Linger, MD

## 2023-05-16 NOTE — Patient Instructions (Signed)
Hypertension, Adult High blood pressure (hypertension) is when the force of blood pumping through the arteries is too strong. The arteries are the blood vessels that carry blood from the heart throughout the body. Hypertension forces the heart to work harder to pump blood and may cause arteries to become narrow or stiff. Untreated or uncontrolled hypertension can lead to a heart attack, heart failure, a stroke, kidney disease, and other problems. A blood pressure reading consists of a higher number over a lower number. Ideally, your blood pressure should be below 120/80. The first ("top") number is called the systolic pressure. It is a measure of the pressure in your arteries as your heart beats. The second ("bottom") number is called the diastolic pressure. It is a measure of the pressure in your arteries as the heart relaxes. What are the causes? The exact cause of this condition is not known. There are some conditions that result in high blood pressure. What increases the risk? Certain factors may make you more likely to develop high blood pressure. Some of these risk factors are under your control, including: Smoking. Not getting enough exercise or physical activity. Being overweight. Having too much fat, sugar, calories, or salt (sodium) in your diet. Drinking too much alcohol. Other risk factors include: Having a personal history of heart disease, diabetes, high cholesterol, or kidney disease. Stress. Having a family history of high blood pressure and high cholesterol. Having obstructive sleep apnea. Age. The risk increases with age. What are the signs or symptoms? High blood pressure may not cause symptoms. Very high blood pressure (hypertensive crisis) may cause: Headache. Fast or irregular heartbeats (palpitations). Shortness of breath. Nosebleed. Nausea and vomiting. Vision changes. Severe chest pain, dizziness, and seizures. How is this diagnosed? This condition is diagnosed by  measuring your blood pressure while you are seated, with your arm resting on a flat surface, your legs uncrossed, and your feet flat on the floor. The cuff of the blood pressure monitor will be placed directly against the skin of your upper arm at the level of your heart. Blood pressure should be measured at least twice using the same arm. Certain conditions can cause a difference in blood pressure between your right and left arms. If you have a high blood pressure reading during one visit or you have normal blood pressure with other risk factors, you may be asked to: Return on a different day to have your blood pressure checked again. Monitor your blood pressure at home for 1 week or longer. If you are diagnosed with hypertension, you may have other blood or imaging tests to help your health care provider understand your overall risk for other conditions. How is this treated? This condition is treated by making healthy lifestyle changes, such as eating healthy foods, exercising more, and reducing your alcohol intake. You may be referred for counseling on a healthy diet and physical activity. Your health care provider may prescribe medicine if lifestyle changes are not enough to get your blood pressure under control and if: Your systolic blood pressure is above 130. Your diastolic blood pressure is above 80. Your personal target blood pressure may vary depending on your medical conditions, your age, and other factors. Follow these instructions at home: Eating and drinking  Eat a diet that is high in fiber and potassium, and low in sodium, added sugar, and fat. An example of this eating plan is called the DASH diet. DASH stands for Dietary Approaches to Stop Hypertension. To eat this way: Eat   plenty of fresh fruits and vegetables. Try to fill one half of your plate at each meal with fruits and vegetables. Eat whole grains, such as whole-wheat pasta, brown rice, or whole-grain bread. Fill about one  fourth of your plate with whole grains. Eat or drink low-fat dairy products, such as skim milk or low-fat yogurt. Avoid fatty cuts of meat, processed or cured meats, and poultry with skin. Fill about one fourth of your plate with lean proteins, such as fish, chicken without skin, beans, eggs, or tofu. Avoid pre-made and processed foods. These tend to be higher in sodium, added sugar, and fat. Reduce your daily sodium intake. Many people with hypertension should eat less than 1,500 mg of sodium a day. Do not drink alcohol if: Your health care provider tells you not to drink. You are pregnant, may be pregnant, or are planning to become pregnant. If you drink alcohol: Limit how much you have to: 0-1 drink a day for women. 0-2 drinks a day for men. Know how much alcohol is in your drink. In the [place] one drink equals one 12 oz bottle of beer (355 mL), one 5 oz glass of wine (148 mL), or one 1 oz glass of hard liquor (44 mL). Lifestyle  Work with your health care provider to maintain a healthy body weight or to lose weight. Ask what an ideal weight is for you. Get at least 30 minutes of exercise that causes your heart to beat faster (aerobic exercise) most days of the week. Activities may include walking, swimming, or biking. Include exercise to strengthen your muscles (resistance exercise), such as Pilates or lifting weights, as part of your weekly exercise routine. Try to do these types of exercises for 30 minutes at least 3 days a week. Do not use any products that contain nicotine or tobacco. These products include cigarettes, chewing tobacco, and vaping devices, such as e-cigarettes. If you need help quitting, ask your health care provider. Monitor your blood pressure at home as told by your health care provider. Keep all follow-up visits. This is important. Medicines Take over-the-counter and prescription medicines only as told by your health care provider. Follow directions carefully. Blood  pressure medicines must be taken as prescribed. Do not skip doses of blood pressure medicine. Doing this puts you at risk for problems and can make the medicine less effective. Ask your health care provider about side effects or reactions to medicines that you should watch for. Contact a health care provider if you: Think you are having a reaction to a medicine you are taking. Have headaches that keep coming back (recurring). Feel dizzy. Have swelling in your ankles. Have trouble with your vision. Get help right away if you: Develop a severe headache or confusion. Have unusual weakness or numbness. Feel faint. Have severe pain in your chest or abdomen. Vomit repeatedly. Have trouble breathing. These symptoms may be an emergency. Get help right away. Call 911. Do not wait to see if the symptoms will go away. Do not drive yourself to the hospital. Summary Hypertension is when the force of blood pumping through your arteries is too strong. If this condition is not controlled, it may put you at risk for serious complications. Your personal target blood pressure may vary depending on your medical conditions, your age, and other factors. For most people, a normal blood pressure is less than 120/80. Hypertension is treated with lifestyle changes, medicines, or a combination of both. Lifestyle changes include losing weight, eating a healthy,   low-sodium diet, exercising more, and limiting alcohol. This information is not intended to replace advice given to you by your health care provider. Make sure you discuss any questions you have with your health care provider. Document Revised: 04/21/2021 Document Reviewed: 04/21/2021 Elsevier Patient Education  2024 Elsevier Inc.  

## 2023-06-07 ENCOUNTER — Telehealth: Payer: Self-pay | Admitting: Internal Medicine

## 2023-06-07 ENCOUNTER — Other Ambulatory Visit: Payer: Self-pay

## 2023-06-07 DIAGNOSIS — M4802 Spinal stenosis, cervical region: Secondary | ICD-10-CM

## 2023-06-07 DIAGNOSIS — M961 Postlaminectomy syndrome, not elsewhere classified: Secondary | ICD-10-CM

## 2023-06-07 DIAGNOSIS — N882 Stricture and stenosis of cervix uteri: Secondary | ICD-10-CM

## 2023-06-07 DIAGNOSIS — M47817 Spondylosis without myelopathy or radiculopathy, lumbosacral region: Secondary | ICD-10-CM

## 2023-06-07 DIAGNOSIS — M170 Bilateral primary osteoarthritis of knee: Secondary | ICD-10-CM

## 2023-06-07 MED ORDER — HYDROCODONE-ACETAMINOPHEN 5-325 MG PO TABS: 1.0000 | ORAL_TABLET | Freq: Three times a day (TID) | ORAL | 0 refills | Status: DC | PRN

## 2023-06-07 NOTE — Telephone Encounter (Signed)
 Medication refill has been sent to Dr. Yetta Barre

## 2023-06-07 NOTE — Telephone Encounter (Signed)
Prescription Request  06/07/2023  LOV: 05/16/2023  What is the name of the medication or equipment? HYDROcodone-acetaminophen (NORCO/VICODIN) 5-325 MG tablet   Have you contacted your pharmacy to request a refill? No   Which pharmacy would you like this sent to?    [facility] [phone] - [place], [place] - [address] AT [facility] [address] [place] [address] Phone: [phone] Fax: [phone]   Patient notified that their request is being sent to the clinical staff for review and that they should receive a response within 2 business days.   Please advise at Mobile [phone] (mobile)

## 2023-07-07 ENCOUNTER — Other Ambulatory Visit: Payer: Self-pay | Admitting: Internal Medicine

## 2023-07-07 DIAGNOSIS — M4802 Spinal stenosis, cervical region: Secondary | ICD-10-CM

## 2023-07-07 DIAGNOSIS — M961 Postlaminectomy syndrome, not elsewhere classified: Secondary | ICD-10-CM

## 2023-07-07 DIAGNOSIS — N882 Stricture and stenosis of cervix uteri: Secondary | ICD-10-CM

## 2023-07-07 DIAGNOSIS — M47817 Spondylosis without myelopathy or radiculopathy, lumbosacral region: Secondary | ICD-10-CM

## 2023-07-07 DIAGNOSIS — M170 Bilateral primary osteoarthritis of knee: Secondary | ICD-10-CM

## 2023-07-07 NOTE — Telephone Encounter (Signed)
 Copied from CRM [id number]. Topic: Clinical - Medication Refill >> Jul 07, 2023  9:01 AM Leotis ORN wrote: Most Recent Primary Care Visit:  Provider: JOSHUA DEBBY CROME  Department: [facility]  Visit Type: OFFICE VISIT  Date: 05/16/2023  Medication: HYDROcodone -acetaminophen  (NORCO/VICODIN) 5-325 MG tablet     Has the patient contacted their pharmacy? Yes (Agent: If no, request that the patient contact the pharmacy for the refill. If patient does not wish to contact the pharmacy document the reason why and proceed with request.) (Agent: If yes, when and what did the pharmacy advise?)  Is this the correct pharmacy for this prescription? Yes If no, delete pharmacy and type the correct one.  This is the patient's preferred pharmacy:   [facility] [phone] - [place], [place] - [address] AT [facility] [address] [place] [address] Phone: [phone] Fax: [phone]   Has the prescription been filled recently? Yes  Is the patient out of the medication? 2 tablets left   Has the patient been seen for an appointment in the last year OR does the patient have an upcoming appointment? No   Can we respond through MyChart? Yes  Agent: Please be advised that Rx refills may take up to 3 business days. We ask that you follow-up with your pharmacy.

## 2023-07-08 MED ORDER — HYDROCODONE-ACETAMINOPHEN 5-325 MG PO TABS: 1.0000 | ORAL_TABLET | Freq: Three times a day (TID) | ORAL | 0 refills | Status: DC | PRN

## 2023-08-08 ENCOUNTER — Other Ambulatory Visit: Payer: Self-pay | Admitting: Internal Medicine

## 2023-08-08 DIAGNOSIS — M961 Postlaminectomy syndrome, not elsewhere classified: Secondary | ICD-10-CM

## 2023-08-08 DIAGNOSIS — M4802 Spinal stenosis, cervical region: Secondary | ICD-10-CM

## 2023-08-08 DIAGNOSIS — M170 Bilateral primary osteoarthritis of knee: Secondary | ICD-10-CM

## 2023-08-08 DIAGNOSIS — N882 Stricture and stenosis of cervix uteri: Secondary | ICD-10-CM

## 2023-08-08 DIAGNOSIS — M47817 Spondylosis without myelopathy or radiculopathy, lumbosacral region: Secondary | ICD-10-CM

## 2023-08-08 NOTE — Telephone Encounter (Signed)
 Last Fill: 07/08/23  Last OV: 05/16/23 Next OV: 08/22/23  Routing to provider for review/authorization.

## 2023-08-08 NOTE — Telephone Encounter (Signed)
 Copied from CRM [id number]. Topic: Clinical - Medication Refill >> Aug 08, 2023 10:55 AM [name] wrote: Most Recent Primary Care Visit:  Provider: Arcadio Knuckles  Department: [facility]  Visit Type: OFFICE VISIT  Date: 05/16/2023  Medication: HYDROcodone -acetaminophen  (NORCO/VICODIN) 5-325 MG tablet  Has the patient contacted their pharmacy? Yes, advised patient to contact pcp (Agent: If no, request that the patient contact the pharmacy for the refill. If patient does not wish to contact the pharmacy document the reason why and proceed with request.) (Agent: If yes, when and what did the pharmacy advise?)  Is this the correct pharmacy for this prescription? Yes If no, delete pharmacy and type the correct one.  This is the patient's preferred pharmacy:  [facility] [phone] - [name], [place] - [address] AT [facility] OF [address] [name] [address] Phone: [phone] Fax: [phone]   Has the prescription been filled recently? No  Is the patient out of the medication? No, patient has two pills left   Has the patient been seen for an appointment in the last year OR does the patient have an upcoming appointment? Yes  Can we respond through MyChart? Yes  Agent: Please be advised that Rx refills may take up to 3 business days. We ask that you follow-up with your pharmacy.

## 2023-08-09 MED ORDER — HYDROCODONE-ACETAMINOPHEN 5-325 MG PO TABS: 1.0000 | ORAL_TABLET | Freq: Three times a day (TID) | ORAL | 0 refills | Status: DC | PRN

## 2023-08-15 ENCOUNTER — Other Ambulatory Visit: Payer: Self-pay | Admitting: Internal Medicine

## 2023-08-15 DIAGNOSIS — I10 Essential (primary) hypertension: Secondary | ICD-10-CM

## 2023-08-22 ENCOUNTER — Ambulatory Visit: Payer: HMO | Admitting: Internal Medicine

## 2023-08-24 ENCOUNTER — Ambulatory Visit: Payer: HMO | Admitting: Internal Medicine

## 2023-08-30 ENCOUNTER — Ambulatory Visit: Payer: HMO | Admitting: Internal Medicine

## 2023-09-08 ENCOUNTER — Other Ambulatory Visit: Payer: Self-pay | Admitting: Internal Medicine

## 2023-09-08 ENCOUNTER — Telehealth: Payer: Self-pay

## 2023-09-08 DIAGNOSIS — M47817 Spondylosis without myelopathy or radiculopathy, lumbosacral region: Secondary | ICD-10-CM

## 2023-09-08 DIAGNOSIS — M170 Bilateral primary osteoarthritis of knee: Secondary | ICD-10-CM

## 2023-09-08 DIAGNOSIS — M4802 Spinal stenosis, cervical region: Secondary | ICD-10-CM

## 2023-09-08 DIAGNOSIS — M961 Postlaminectomy syndrome, not elsewhere classified: Secondary | ICD-10-CM

## 2023-09-08 DIAGNOSIS — N882 Stricture and stenosis of cervix uteri: Secondary | ICD-10-CM

## 2023-09-08 MED ORDER — HYDROCODONE-ACETAMINOPHEN 5-325 MG PO TABS
1.0000 | ORAL_TABLET | Freq: Three times a day (TID) | ORAL | 0 refills | Status: DC | PRN
Start: 2023-09-08 — End: 2023-10-10

## 2023-09-08 NOTE — Telephone Encounter (Signed)
 Copied from CRM [id number]. Topic: Clinical - Prescription Issue >> Sep 08, 2023 10:47 AM [place] E wrote: Reason for CRM: Patient called in regarding 2 of her medications: HYDROcodone-acetaminophen (NORCO/VICODIN) 5-325 MG tablet  linaclotide (LINZESS) 290 MCG CAPS capsule Patient stated that PCP needs to sign off on more refills for both of these medications.

## 2023-09-20 ENCOUNTER — Ambulatory Visit: Admitting: Orthopaedic Surgery

## 2023-09-22 ENCOUNTER — Other Ambulatory Visit (INDEPENDENT_AMBULATORY_CARE_PROVIDER_SITE_OTHER): Payer: Self-pay

## 2023-09-22 ENCOUNTER — Ambulatory Visit: Admitting: Orthopaedic Surgery

## 2023-09-22 DIAGNOSIS — Z981 Arthrodesis status: Secondary | ICD-10-CM | POA: Diagnosis not present

## 2023-09-22 NOTE — Progress Notes (Signed)
 Office Visit Note   Patient: Claudia [name]           Date of Birth: 11/04/1933           MRN: [id number] Visit Date: 09/22/2023              Requested by: Etta Grandchild, MD [address] [place],  [place] [address] PCP: Etta Grandchild, MD   Assessment & Plan: Visit Diagnoses:  1. S/P cervical spinal fusion     Plan: Patient needs to work on a walking daily program and exercising upper and lower extremities to get decrease her fall risk.  Should get some new tennis balls for her walker with 5 inch front wheels.  She still prefers her rolling walker with reverse seat and has not fallen.  She had severe myelopathic changes which is now stable.  Encourage ambulation and strengthening diet and regular medical follow-ups with her PCP.  Follow-up peers on appearing basis.  Follow-Up Instructions: No follow-ups on file.   Orders:  Orders Placed This Encounter  Procedures   XR Cervical Spine 2 or 3 views   No orders of the defined types were placed in this encounter.     Procedures: No procedures performed   Clinical Data: No additional findings.   Subjective: Chief Complaint  Patient presents with   Neck - Follow-up    10/19/2021 C3-4, C4-5, C5-6 ACDF    HPI 89 year old female now 2 years out post decompression for severe stenosis with myelopathic changes in the cord 3 level fusion C3-C6 with allograft endplates.  Her fusion is solid she uses walker for balance.  Prefers a rolling walker with reverse seat at home has walker with 5 inch wheels but needs her tennis balls replaced.  Additionally has meningioma stable also atrial fibs, stage III kidney disease cannot take anti-inflammatories.  She had been on Norco 10/325 3 tablets a day now is on nothing states really pain is not much different.  She asked about tramadol we had a long discussion about taking chronic narcotic medication with tolerance, other side effects etc.  She can use plain Tylenol.  Encouraged her to  do daily walking rather than every other day.  Review of Systems updated unchanged.   Objective: Vital Signs: There were no vitals taken for this visit.  Physical Exam Constitutional:      Appearance: She is well-developed.  HENT:     Head: Normocephalic.     Right Ear: External ear normal.     Left Ear: External ear normal. There is no impacted cerumen.  Eyes:     Pupils: Pupils are equal, round, and reactive to light.  Neck:     Thyroid: No thyromegaly.     Trachea: No tracheal deviation.  Cardiovascular:     Rate and Rhythm: Normal rate.  Pulmonary:     Effort: Pulmonary effort is normal.  Abdominal:     Palpations: Abdomen is soft.  Musculoskeletal:     Cervical back: No rigidity.  Skin:    General: Skin is warm and dry.  Neurological:     Mental Status: She is alert and oriented to person, place, and time.  Psychiatric:        Behavior: Behavior normal.     Ortho Exam patient uses her arms to get from sitting to standing position.  Still driving car.  Some decreased balance symmetrical decreased strength diffuse upper extremities.  Specialty Comments:  No specialty comments available.  Imaging:  No results found.   PMFS History: Patient Active Problem List   Diagnosis Date Noted   Hemoptysis 05/16/2023   Vitamin D deficiency disease 06/21/2022   Encounter for general adult medical examination with abnormal findings 06/21/2022   Vitamin B12 deficiency neuropathy (HCC) 02/15/2022   Cervical stricture or stenosis 10/20/2021   Cervical spinal stenosis 10/19/2021   Meningioma (HCC) 08/24/2021   PAF (paroxysmal atrial fibrillation) (HCC) 11/03/2020   Stage 3b chronic kidney disease (HCC) 05/06/2020   Spinal stenosis in cervical region 05/12/2016   Therapeutic opioid-induced constipation (OIC) 01/01/2016   Chronic idiopathic constipation 09/11/2013   Lumbosacral spondylosis without myelopathy 04/09/2013   Postlaminectomy syndrome, lumbar region 04/09/2013    Anticoagulation management encounter 02/01/2012   Neuropathy, peripheral 08/04/2011   Pure hypercholesterolemia 08/04/2011   Essential hypertension, benign 08/04/2011   Hypothyroidism 08/04/2011   DJD (degenerative joint disease) of knee 08/04/2011   Gout 08/04/2011   Past Medical History:  Diagnosis Date   Abnormality of gait    Brachial neuritis or radiculitis NOS    Degeneration of lumbar or lumbosacral intervertebral disc    Dysrhythmia    Essential hypertension, benign    Gouty arthropathy    Lumbago    Obesity    Osteoarthritis    Pure hypercholesterolemia    Unspecified hypothyroidism     Family History  Problem Relation Age of Onset   Heart disease Father    Heart attack Father    Hypertension Mother    Alzheimer's disease Mother    Diabetes Sister    Cirrhosis Brother    Diabetes Sister    Diabetes Sister    Diabetes Brother    Heart attack Brother    Cancer Neg Hx    Kidney disease Neg Hx     Past Surgical History:  Procedure Laterality Date   ABDOMINAL HYSTERECTOMY  03/13/2010   ANTERIOR CERVICAL DECOMP/DISCECTOMY FUSION N/A 10/19/2021   Procedure: C3-4, C4-5, C5-6 ANTERIOR CERVICAL DISCECTOMY FUSION, ALLOGRAFT, PLATE;  Surgeon: Eldred Manges, MD;  Location: [facility] OR;  Service: Orthopedics;  Laterality: N/A;   BACK SURGERY     LUMBAR LAMINECTOMY  03/13/2010   right knee replacement  03/13/2010   Social History   Occupational History   Not on file  Tobacco Use   Smoking status: Never   Smokeless tobacco: Never  Vaping Use   Vaping status: Never Used  Substance and Sexual Activity   Alcohol use: Yes    Comment: 1 glass wine/month   Drug use: No   Sexual activity: Never

## 2023-09-26 ENCOUNTER — Ambulatory Visit: Payer: HMO | Admitting: Internal Medicine

## 2023-09-26 ENCOUNTER — Encounter: Payer: Self-pay | Admitting: Internal Medicine

## 2023-09-26 ENCOUNTER — Ambulatory Visit (INDEPENDENT_AMBULATORY_CARE_PROVIDER_SITE_OTHER): Payer: HMO | Admitting: Internal Medicine

## 2023-09-26 VITALS — BP 138/74 | HR 53 | Temp 97.8°F | Ht 69.0 in | Wt 148.0 lb

## 2023-09-26 DIAGNOSIS — I10 Essential (primary) hypertension: Secondary | ICD-10-CM

## 2023-09-26 DIAGNOSIS — G63 Polyneuropathy in diseases classified elsewhere: Secondary | ICD-10-CM | POA: Diagnosis not present

## 2023-09-26 DIAGNOSIS — I480 Paroxysmal atrial fibrillation: Secondary | ICD-10-CM | POA: Diagnosis not present

## 2023-09-26 DIAGNOSIS — E039 Hypothyroidism, unspecified: Secondary | ICD-10-CM | POA: Diagnosis not present

## 2023-09-26 DIAGNOSIS — Z0000 Encounter for general adult medical examination without abnormal findings: Secondary | ICD-10-CM

## 2023-09-26 DIAGNOSIS — E538 Deficiency of other specified B group vitamins: Secondary | ICD-10-CM | POA: Diagnosis not present

## 2023-09-26 DIAGNOSIS — K5904 Chronic idiopathic constipation: Secondary | ICD-10-CM | POA: Diagnosis not present

## 2023-09-26 DIAGNOSIS — M1A39X0 Chronic gout due to renal impairment, multiple sites, without tophus (tophi): Secondary | ICD-10-CM

## 2023-09-26 DIAGNOSIS — Z0001 Encounter for general adult medical examination with abnormal findings: Secondary | ICD-10-CM

## 2023-09-26 LAB — CBC WITH DIFFERENTIAL/PLATELET
Basophils Absolute: 0 10*3/uL (ref 0.0–0.1)
Basophils Relative: 1.1 % (ref 0.0–3.0)
Eosinophils Absolute: 0.2 10*3/uL (ref 0.0–0.7)
Eosinophils Relative: 3.8 % (ref 0.0–5.0)
HCT: 36.5 % (ref 36.0–46.0)
Hemoglobin: 12.1 g/dL (ref 12.0–15.0)
Lymphocytes Relative: 42.7 % (ref 12.0–46.0)
Lymphs Abs: 1.8 10*3/uL (ref 0.7–4.0)
MCHC: 33.1 g/dL (ref 30.0–36.0)
MCV: 88.1 fl (ref 78.0–100.0)
Monocytes Absolute: 0.5 10*3/uL (ref 0.1–1.0)
Monocytes Relative: 10.8 % (ref 3.0–12.0)
Neutro Abs: 1.8 10*3/uL (ref 1.4–7.7)
Neutrophils Relative %: 41.6 % — ABNORMAL LOW (ref 43.0–77.0)
Platelets: 188 10*3/uL (ref 150.0–400.0)
RBC: 4.14 Mil/uL (ref 3.87–5.11)
RDW: 14.2 % (ref 11.5–15.5)
WBC: 4.2 10*3/uL (ref 4.0–10.5)

## 2023-09-26 LAB — BASIC METABOLIC PANEL WITH GFR
BUN: 24 mg/dL — ABNORMAL HIGH (ref 6–23)
CO2: 28 meq/L (ref 19–32)
Calcium: 10.2 mg/dL (ref 8.4–10.5)
Chloride: 107 meq/L (ref 96–112)
Creatinine, Ser: 1.07 mg/dL (ref 0.40–1.20)
GFR: 46.68 mL/min — ABNORMAL LOW (ref >60.00)
Glucose, Bld: 97 mg/dL (ref 70–99)
Potassium: 4.7 meq/L (ref 3.5–5.1)
Sodium: 142 meq/L (ref 135–145)

## 2023-09-26 LAB — VITAMIN B12: Vitamin B-12: >1537 pg/mL — ABNORMAL HIGH (ref 211–911)

## 2023-09-26 LAB — TSH: TSH: 2.81 u[IU]/mL (ref 0.35–5.50)

## 2023-09-26 LAB — FOLATE: Folate: 11 ng/mL (ref >5.9)

## 2023-09-26 NOTE — Progress Notes (Unsigned)
 Subjective:  Patient ID: Claudia [name], female    DOB: 1934-05-13  Age: 89 y.o. MRN: [id number]  CC: Annual Exam, Hypertension, and Hypothyroidism   HPI Claudia [name] presents for a CPX and f/up ---  Discussed the use of AI scribe software for clinical note transcription with the patient, who gave verbal consent to proceed.  History of Present Illness   The patient presents with weight loss and throat discomfort.  She experiences unintentional weight loss despite maintaining a normal diet, including consuming ice cream and candy when feeling full. No dizziness, lightheadedness, chest pain, or abdominal pain. Her heart rate was noted to be low during the visit.  She has throat discomfort with phlegm since a previous surgery. There is no cough, but she frequently clears her throat, and sometimes phlegm is produced. She previously took Tegsec three months ago, and her lungs were clear at that time.  She takes Linzess once or twice a week for constipation, which helps with her bowel movements.  No irregular heartbeats or abnormal sensations in her heart.       Outpatient Medications Prior to Visit  Medication Sig Dispense Refill   cyanocobalamin 2000 MCG tablet Take 1 tablet (2,000 mcg total) by mouth daily. 90 tablet 1   HYDROcodone-acetaminophen (NORCO/VICODIN) 5-325 MG tablet Take 1 tablet by mouth every 8 (eight) hours as needed for moderate pain (pain score 4-6). 90 tablet 0   linaclotide (LINZESS) 290 MCG CAPS capsule Take 1 capsule (290 mcg total) by mouth daily before breakfast. 90 capsule 1   Rivaroxaban (XARELTO) 15 MG TABS tablet Take 1 tablet (15 mg total) by mouth daily with supper. 90 tablet 1   torsemide (DEMADEX) 20 MG tablet TAKE 1 TABLET BY MOUTH ONCE DAILY 90 tablet 0   No facility-administered medications prior to visit.    ROS Review of Systems  Constitutional:  Positive for appetite change and unexpected weight change (wt loss). Negative for activity  change, chills, diaphoresis and fatigue.  HENT: Negative.  Negative for trouble swallowing and voice change.   Eyes: Negative.   Respiratory:  Negative for cough, chest tightness, shortness of breath and wheezing.   Cardiovascular:  Negative for chest pain, palpitations and leg swelling.  Gastrointestinal:  Negative for abdominal pain, constipation, diarrhea, nausea and vomiting.  Endocrine: Negative.   Genitourinary: Negative.  Negative for difficulty urinating and dysuria.  Musculoskeletal:  Positive for gait problem and neck pain.  Neurological:  Negative for dizziness and weakness.  Hematological:  Negative for adenopathy. Does not bruise/bleed easily.  Psychiatric/Behavioral: Negative.      Objective:  BP 138/74 (BP Location: Right Arm, Patient Position: Sitting, Cuff Size: Normal)   Pulse (!) 53   Temp 97.8 F (36.6 C) (Oral)   Ht 5\' 9"  (1.753 m)   Wt 148 lb (67.1 kg)   SpO2 99%   BMI 21.86 kg/m   BP Readings from Last 3 Encounters:  09/26/23 138/74  05/16/23 136/76  03/29/23 130/72    Wt Readings from Last 3 Encounters:  09/26/23 148 lb (67.1 kg)  05/16/23 156 lb 12.8 oz (71.1 kg)  12/13/22 159 lb (72.1 kg)    Physical Exam Vitals reviewed.  Constitutional:      General: She is not in acute distress.    Appearance: She is ill-appearing. She is not toxic-appearing or diaphoretic.  HENT:     Nose: Nose normal.     Mouth/Throat:     Mouth: Mucous membranes are moist.  Eyes:     General: No scleral icterus.    Conjunctiva/sclera: Conjunctivae normal.  Cardiovascular:     Rate and Rhythm: Bradycardia present.     Heart sounds: Normal heart sounds and S2 normal. No murmur heard.    No friction rub. No gallop.     Comments: EKG- SB with PSVC's, 50 bpm Junctional ST depression - normal variant No LVH or Q waves  Pulmonary:     Effort: Pulmonary effort is normal. No respiratory distress.     Breath sounds: No stridor. No wheezing, rhonchi or rales.  Chest:      Chest wall: No tenderness.  Abdominal:     General: Abdomen is flat.     Palpations: There is no mass.     Tenderness: There is no abdominal tenderness. There is no guarding.     Hernia: No hernia is present.  Musculoskeletal:     Cervical back: Neck supple.     Right lower leg: No edema.     Left lower leg: No edema.  Lymphadenopathy:     Cervical: No cervical adenopathy.  Skin:    General: Skin is warm and dry.     Findings: No rash.  Neurological:     General: No focal deficit present.     Mental Status: Mental status is at baseline.  Psychiatric:        Mood and Affect: Mood normal.        Behavior: Behavior normal.     Lab Results  Component Value Date   WBC 4.2 09/26/2023   HGB 12.1 09/26/2023   HCT 36.5 09/26/2023   PLT 188.0 09/26/2023   GLUCOSE 97 09/26/2023   CHOL 148 05/05/2021   TRIG 85.0 05/05/2021   HDL 54.40 05/05/2021   LDLDIRECT 158.9 09/05/2012   LDLCALC 77 05/05/2021   ALT 7 05/16/2023   AST 15 05/16/2023   NA 142 09/26/2023   K 4.7 09/26/2023   CL 107 09/26/2023   CREATININE 1.07 09/26/2023   BUN 24 (H) 09/26/2023   CO2 28 09/26/2023   TSH 2.81 09/26/2023   INR 1.2 (H) 05/16/2023   HGBA1C 5.5 11/03/2020    DG Cervical Spine 2 or 3 views Result Date: 10/19/2021 CLINICAL DATA:  ACDF EXAM: CERVICAL SPINE - 2-3 VIEW COMPARISON:  Radiograph 07/28/2021 FINDINGS: Intraoperative images during C3-C4, C4-C5, and C5-C6 ACDF. Intact hardware without evidence of immediate complication. IMPRESSION: Intraoperative images during C3-C6 ACDF. No evidence of immediate complication. Electronically Signed   By: Caprice Renshaw M.D.   On: 10/19/2021 11:27   DG C-Arm 1-60 Min-No Report Result Date: 10/19/2021 Fluoroscopy was utilized by the requesting physician.  No radiographic interpretation.   DG C-Arm 1-60 Min-No Report Result Date: 10/19/2021 Fluoroscopy was utilized by the requesting physician.  No radiographic interpretation.   DG C-Arm 1-60 Min-No  Report Result Date: 10/19/2021 Fluoroscopy was utilized by the requesting physician.  No radiographic interpretation.    Assessment & Plan:   Essential hypertension, benign- Her BP is well controlled. -     Basic metabolic panel with GFR; Future  Acquired hypothyroidism- She is euthyroid. -     TSH; Future  PAF (paroxysmal atrial fibrillation) (HCC)- She has good R/R control. -     TSH; Future -     EKG 12-Lead  Vitamin B12 deficiency neuropathy (HCC) -     Folate; Future -     Vitamin B12; Future -     CBC with Differential/Platelet; Future  Encounter for  general adult medical examination with abnormal findings- Exam completed, labs reviewed, vaccines reviewed, no cancer screenings indicated, pt ed material was given.   Chronic idiopathic constipation -     TSH; Future     Follow-up: Return in about 6 months (around 03/27/2024).  Sanda Linger, MD

## 2023-09-26 NOTE — Patient Instructions (Signed)

## 2023-09-27 ENCOUNTER — Encounter: Payer: Self-pay | Admitting: Internal Medicine

## 2023-10-10 ENCOUNTER — Other Ambulatory Visit: Payer: Self-pay | Admitting: Internal Medicine

## 2023-10-10 DIAGNOSIS — M961 Postlaminectomy syndrome, not elsewhere classified: Secondary | ICD-10-CM

## 2023-10-10 DIAGNOSIS — M4802 Spinal stenosis, cervical region: Secondary | ICD-10-CM

## 2023-10-10 DIAGNOSIS — M47817 Spondylosis without myelopathy or radiculopathy, lumbosacral region: Secondary | ICD-10-CM

## 2023-10-10 DIAGNOSIS — M170 Bilateral primary osteoarthritis of knee: Secondary | ICD-10-CM

## 2023-10-10 DIAGNOSIS — N882 Stricture and stenosis of cervix uteri: Secondary | ICD-10-CM

## 2023-10-10 MED ORDER — HYDROCODONE-ACETAMINOPHEN 5-325 MG PO TABS
1.0000 | ORAL_TABLET | Freq: Three times a day (TID) | ORAL | 0 refills | Status: DC | PRN
Start: 2023-10-10 — End: 2023-11-10

## 2023-10-10 NOTE — Telephone Encounter (Signed)
 Copied from CRM [id number]. Topic: Clinical - Medication Refill >> Oct 10, 2023  9:01 AM Zipporah Him wrote: Most Recent Primary Care Visit:  Provider: Arcadio Knuckles  Department: [facility]  Visit Type: OFFICE VISIT  Date: 09/26/2023  Medication: HYDROcodone-acetaminophen (NORCO/VICODIN) 5-325 MG tablet  Has the patient contacted their pharmacy? Yes Call office  Is this the correct pharmacy for this prescription? Yes If no, delete pharmacy and type the correct one.  This is the patient's preferred pharmacy:  [facility] [phone] - [name], [place] - [address] AT [facility] [address] [place] [address] Phone: [phone] Fax: [phone]   Has the prescription been filled recently? Yes  Is the patient out of the medication? No, few left  Has the patient been seen for an appointment in the last year OR does the patient have an upcoming appointment? Yes  Can we respond through MyChart? Yes  Agent: Please be advised that Rx refills may take up to 3 business days. We ask that you follow-up with your pharmacy.

## 2023-11-10 ENCOUNTER — Other Ambulatory Visit: Payer: Self-pay | Admitting: Internal Medicine

## 2023-11-10 DIAGNOSIS — I10 Essential (primary) hypertension: Secondary | ICD-10-CM

## 2023-11-10 DIAGNOSIS — M4802 Spinal stenosis, cervical region: Secondary | ICD-10-CM

## 2023-11-10 DIAGNOSIS — M170 Bilateral primary osteoarthritis of knee: Secondary | ICD-10-CM

## 2023-11-10 DIAGNOSIS — M961 Postlaminectomy syndrome, not elsewhere classified: Secondary | ICD-10-CM

## 2023-11-10 DIAGNOSIS — M47817 Spondylosis without myelopathy or radiculopathy, lumbosacral region: Secondary | ICD-10-CM

## 2023-11-10 DIAGNOSIS — N882 Stricture and stenosis of cervix uteri: Secondary | ICD-10-CM

## 2023-11-10 MED ORDER — HYDROCODONE-ACETAMINOPHEN 5-325 MG PO TABS: 1.0000 | ORAL_TABLET | Freq: Three times a day (TID) | ORAL | 0 refills | Status: DC | PRN

## 2023-11-10 MED ORDER — TORSEMIDE 20 MG PO TABS: 20.0000 mg | ORAL_TABLET | Freq: Every day | ORAL | 0 refills | Status: DC

## 2023-11-10 NOTE — Telephone Encounter (Signed)
 Copied from CRM [id number]. Topic: Clinical - Medication Refill >> Nov 10, 2023 11:24 AM Jim Motts C wrote: Medication: HYDROcodone -acetaminophen  (NORCO/VICODIN) 5-325 MG tablet [[id number]] and torsemide  (DEMADEX ) 20 MG tablet [[id number]]  Has the patient contacted their pharmacy? Yes, Patient was advised to call the doctor.  (Agent: If no, request that the patient contact the pharmacy for the refill. If patient does not wish to contact the pharmacy document the reason why and proceed with request.) (Agent: If yes, when and what did the pharmacy advise?)  This is the patient's preferred pharmacy:  [facility] [phone] - [place], [place] - [address] AT [facility] OF [address] [place] [address] Phone: [phone] Fax: [phone]  Is this the correct pharmacy for this prescription? Yes If no, delete pharmacy and type the correct one.   Has the prescription been filled recently? No  Is the patient out of the medication? Yes  Has the patient been seen for an appointment in the last year OR does the patient have an upcoming appointment? Yes  Can we respond through MyChart? Yes or phone call.   Agent: Please be advised that Rx refills may take up to 3 business days. We ask that you follow-up with your pharmacy.

## 2023-12-14 ENCOUNTER — Telehealth: Payer: Self-pay | Admitting: Internal Medicine

## 2023-12-14 DIAGNOSIS — M47817 Spondylosis without myelopathy or radiculopathy, lumbosacral region: Secondary | ICD-10-CM

## 2023-12-14 DIAGNOSIS — M961 Postlaminectomy syndrome, not elsewhere classified: Secondary | ICD-10-CM

## 2023-12-14 DIAGNOSIS — M170 Bilateral primary osteoarthritis of knee: Secondary | ICD-10-CM

## 2023-12-14 DIAGNOSIS — N882 Stricture and stenosis of cervix uteri: Secondary | ICD-10-CM

## 2023-12-14 DIAGNOSIS — M4802 Spinal stenosis, cervical region: Secondary | ICD-10-CM

## 2023-12-14 MED ORDER — HYDROCODONE-ACETAMINOPHEN 5-325 MG PO TABS: 1.0000 | ORAL_TABLET | Freq: Three times a day (TID) | ORAL | 0 refills | Status: DC | PRN

## 2023-12-14 NOTE — Telephone Encounter (Signed)
 Copied from CRM [id number]. Topic: Clinical - Medication Refill >> Dec 14, 2023 12:23 PM Baldo Levan wrote: Medication: HYDROcodone -acetaminophen  (NORCO/VICODIN) 5-325 MG tablet [[id number]]  Has the patient contacted their pharmacy? Yes- stated she needed to contact the doctor (Agent: If no, request that the patient contact the pharmacy for the refill. If patient does not wish to contact the pharmacy document the reason why and proceed with request.) (Agent: If yes, when and what did the pharmacy advise?)  This is the patient's preferred pharmacy:  [facility] [phone] - [place], [place] - [address] AT [facility] OF [address]  [place] [address] Phone: [phone] Fax: [phone]  Is this the correct pharmacy for this prescription? Yes If no, delete pharmacy and type the correct one.   Has the prescription been filled recently? No  Is the patient out of the medication? No- patient has two left  Has the patient been seen for an appointment in the last year OR does the patient have an upcoming appointment? Yes  Can we respond through MyChart? Yes  Agent: Please be advised that Rx refills may take up to 3 business days. We ask that you follow-up with your pharmacy.

## 2023-12-22 ENCOUNTER — Encounter

## 2023-12-22 DIAGNOSIS — Z0000 Encounter for general adult medical examination without abnormal findings: Secondary | ICD-10-CM

## 2023-12-22 NOTE — Patient Instructions (Signed)
 Claudia [name] , Thank you for taking time out of your busy schedule to complete your Annual Wellness Visit with me. I enjoyed our conversation and look forward to speaking with you again next year. I, as well as your care team,  appreciate your ongoing commitment to your health goals. Please review the following plan we discussed and let me know if I can assist you in the future. Your Game plan/ To Do List    Referrals: If you haven't heard from the office you've been referred to, please reach out to them at the phone provided.   Follow up Visits: Next Medicare AWV with our clinical staff:    Have you seen your provider in the last 6 months (3 months if uncontrolled diabetes)?  Next Office Visit with your provider:   Clinician Recommendations:  Aim for 30 minutes of exercise or brisk walking, 6-8 glasses of water, and 5 servings of fruits and vegetables each day.       This is a list of the screening recommended for you and due dates:  Health Maintenance  Topic Date Due   Flu Shot  01/27/2024   Medicare Annual Wellness Visit  12/21/2024   DTaP/Tdap/Td vaccine (4 - Td or Tdap) 08/24/2027   Pneumococcal Vaccine for age over 17  Completed   DEXA scan (bone density measurement)  Completed   Zoster (Shingles) Vaccine  Completed   Hepatitis B Vaccine  Aged Out   HPV Vaccine  Aged Out   Meningitis B Vaccine  Aged Out   COVID-19 Vaccine  Discontinued    Advanced directives:  Advance Care Planning is important because it:  [x]  Makes sure you receive the medical care that is consistent with your values, goals, and preferences  [x]  It provides guidance to your family and loved ones and reduces their decisional burden about whether or not they are making the right decisions based on your wishes.  Follow the link provided in your after visit summary or read over the paperwork we have mailed to you to help you started getting your Advance Directives in place. If you need assistance in completing  these, please reach out to us  so that we can help you!  See attachments for Preventive Care and Fall Prevention Tips.

## 2023-12-22 NOTE — Progress Notes (Signed)
 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

## 2023-12-22 NOTE — Progress Notes (Signed)
 This encounter was created in error - please disregard.

## 2024-01-10 ENCOUNTER — Other Ambulatory Visit: Payer: Self-pay | Admitting: Internal Medicine

## 2024-01-10 DIAGNOSIS — K5904 Chronic idiopathic constipation: Secondary | ICD-10-CM

## 2024-01-10 DIAGNOSIS — M170 Bilateral primary osteoarthritis of knee: Secondary | ICD-10-CM

## 2024-01-10 DIAGNOSIS — N882 Stricture and stenosis of cervix uteri: Secondary | ICD-10-CM

## 2024-01-10 DIAGNOSIS — M4802 Spinal stenosis, cervical region: Secondary | ICD-10-CM

## 2024-01-10 DIAGNOSIS — M961 Postlaminectomy syndrome, not elsewhere classified: Secondary | ICD-10-CM

## 2024-01-10 DIAGNOSIS — M47817 Spondylosis without myelopathy or radiculopathy, lumbosacral region: Secondary | ICD-10-CM

## 2024-01-10 NOTE — Telephone Encounter (Signed)
 Copied from CRM [id number]. Topic: Clinical - Medication Refill >> Jan 10, 2024  2:53 PM Henretta I wrote: Medication: HYDROcodone -acetaminophen  (NORCO/VICODIN) 5-325 MG tablet linaclotide  (LINZESS ) 290 MCG CAPS capsule    Has the patient contacted their pharmacy? Yes, pharmacy stated Doctor has to send it in  (Agent: If no, request that the patient contact the pharmacy for the refill. If patient does not wish to contact the pharmacy document the reason why and proceed with request.) (Agent: If yes, when and what did the pharmacy advise?)  This is the patient's preferred pharmacy:  [facility] [phone] - [place],  - [address] AT [facility] OF [address] [place] [address] Phone: [phone] Fax: [phone]  Is this the correct pharmacy for this prescription? Yes If no, delete pharmacy and type the correct one.   Has the prescription been filled recently? No  Is the patient out of the medication? No 3 hydrocodone  and 4 linzess    Has the patient been seen for an appointment in the last year OR does the patient have an upcoming appointment? Yes  Can we respond through MyChart? Yes  Agent: Please be advised that Rx refills may take up to 3 business days. We ask that you follow-up with your pharmacy.

## 2024-01-13 NOTE — Telephone Encounter (Signed)
 Copied from CRM [id number]. Topic: Clinical - Medication Question >> Jan 13, 2024  3:46 PM Burnard DEL wrote: Reason for CRM: Patient called in to inquire about her medication Hydrocodone  and Linzess  being sent to pharmacy for her.Patient stated that she called the refill in on Tuesday and wanted to know why Dr Joshua has not sent prescription to the pharmacy?

## 2024-01-16 ENCOUNTER — Telehealth: Payer: Self-pay

## 2024-01-16 NOTE — Telephone Encounter (Unsigned)
 Copied from CRM [id number]. Topic: Clinical - Prescription Issue >> Jan 16, 2024  4:26 PM Jasmin G wrote: Reason for CRM: Pt call regarding her HYDROcodone -acetaminophen  (NORCO/VICODIN) 5-325 MG tablet, prescription was put in last week but it has not been refilled by doctor, pt states she needs it for her pain management, please call pt back ASAP.

## 2024-01-16 NOTE — Telephone Encounter (Signed)
 Patient called back to see why her medication has not been filled. She would like a call back at [phone].

## 2024-01-16 NOTE — Telephone Encounter (Unsigned)
 Copied from CRM [id number]. Topic: Clinical - Medication Question >> Jan 13, 2024  3:46 PM Claudia [name] wrote: Reason for CRM: Patient called in to inquire about her medication Hydrocodone  and Linzess  being sent to pharmacy for her.Patient stated that she called the refill in on Tuesday and wanted to know why Dr Joshua has not sent prescription to the pharmacy? >> Jan 16, 2024  4:57 PM Claudia [name] wrote: Patient calling very upset that she has not gotten an answer- please call pateint [phone]

## 2024-01-17 MED ORDER — LINACLOTIDE 290 MCG PO CAPS: 290.0000 ug | ORAL_CAPSULE | Freq: Every day | ORAL | 1 refills | Status: DC

## 2024-01-17 MED ORDER — HYDROCODONE-ACETAMINOPHEN 5-325 MG PO TABS: 1.0000 | ORAL_TABLET | Freq: Three times a day (TID) | ORAL | 0 refills | Status: DC | PRN

## 2024-01-17 NOTE — Telephone Encounter (Signed)
Rx has been sent in. 

## 2024-01-17 NOTE — Telephone Encounter (Signed)
 Requesting: Norco 5-325  Contract: N/A UDS: N/A Last Visit: 09/26/2023 Next Visit: N/A Last Refill: 12/14/2023  Please Advise

## 2024-01-23 ENCOUNTER — Ambulatory Visit (INDEPENDENT_AMBULATORY_CARE_PROVIDER_SITE_OTHER)

## 2024-01-23 VITALS — Ht 69.0 in | Wt 148.0 lb

## 2024-01-23 DIAGNOSIS — Z0000 Encounter for general adult medical examination without abnormal findings: Secondary | ICD-10-CM

## 2024-01-23 NOTE — Patient Instructions (Addendum)
 Ms. Korf , Thank you for taking time out of your busy schedule to complete your Annual Wellness Visit with me. I enjoyed our conversation and look forward to speaking with you again next year. I, as well as your care team,  appreciate your ongoing commitment to your health goals. Please review the following plan we discussed and let me know if I can assist you in the future. Your Game plan/ To Do List    Follow up Visits: Next Medicare AWV with our clinical staff: Patient prefers a telephone visit.  01/23/2025.   Have you seen your provider in the last 6 months (3 months if uncontrolled diabetes)? Yes Next Office Visit with your provider: Patient states that she will call office to schedule next office visit.  Last visit was 09/26/2023.  Clinician Recommendations:  Aim for 30 minutes of exercise or brisk walking, 6-8 glasses of water, and 5 servings of fruits and vegetables each day. Remember to discuss a bone density screening with Dr. Joshua during your next office visit.      This is a list of the screening recommended for you and due dates:  Health Maintenance  Topic Date Due   Flu Shot  01/27/2024   Medicare Annual Wellness Visit  01/22/2025   DTaP/Tdap/Td vaccine (4 - Td or Tdap) 08/24/2027   Pneumococcal Vaccine for age over 38  Completed   DEXA scan (bone density measurement)  Completed   Zoster (Shingles) Vaccine  Completed   Hepatitis B Vaccine  Aged Out   HPV Vaccine  Aged Out   Meningitis B Vaccine  Aged Out   COVID-19 Vaccine  Discontinued    Advanced directives: (Copy Requested) Please bring a copy of your health care power of attorney and living will to the office to be added to your chart at your convenience. You can mail to [facility] [address] [place], [place] [address] or email to [email or web address] Advance Care Planning is important because it:  [x]  Makes sure you receive the medical care that is consistent with your values, goals, and  preferences  [x]  It provides guidance to your family and loved ones and reduces their decisional burden about whether or not they are making the right decisions based on your wishes.  Follow the link provided in your after visit summary or read over the paperwork we have mailed to you to help you started getting your Advance Directives in place. If you need assistance in completing these, please reach out to us  so that we can help you!  See attachments for Preventive Care and Fall Prevention Tips.   Managing Pain Without Opioids Opioids are strong medicines used to treat moderate to severe pain. For some people, especially those who have long-term (chronic) pain, opioids may not be the best choice for pain management due to: Side effects like nausea, constipation, and sleepiness. The risk of addiction (opioid use disorder). The longer you take opioids, the greater your risk of addiction. Pain that lasts for more than 3 months is called chronic pain. Managing chronic pain usually requires more than one approach and is often provided by a team of health care providers working together (multidisciplinary approach). Pain management may be done at a pain management center or pain clinic. How to manage pain without the use of opioids Use non-opioid medicines Non-opioid medicines for pain may include: Over-the-counter or prescription non-steroidal anti-inflammatory drugs (NSAIDs). These may be the first medicines used for pain. They work well for  muscle and bone pain, and they reduce swelling. Acetaminophen . This over-the-counter medicine may work well for milder pain but not swelling. Antidepressants. These may be used to treat chronic pain. A certain type of antidepressant (tricyclics) is often used. These medicines are given in lower doses for pain than when used for depression. Anticonvulsants. These are usually used to treat seizures but may also reduce nerve (neuropathic) pain. Muscle relaxants.  These relieve pain caused by sudden muscle tightening (spasms). You may also use a pain medicine that is applied to the skin as a patch, cream, or gel (topical analgesic), such as a numbing medicine. These may cause fewer side effects than medicines taken by mouth. Do certain therapies as directed Some therapies can help with pain management. They include: Physical therapy. You will do exercises to gain strength and flexibility. A physical therapist may teach you exercises to move and stretch parts of your body that are weak, stiff, or painful. You can learn these exercises at physical therapy visits and practice them at home. Physical therapy may also involve: Massage. Heat wraps or applying heat or cold to affected areas. Electrical signals that interrupt pain signals (transcutaneous electrical nerve stimulation, TENS). Weak lasers that reduce pain and swelling (low-level laser therapy). Signals from your body that help you learn to regulate pain (biofeedback). Occupational therapy. This helps you to learn ways to function at home and work with less pain. Recreational therapy. This involves trying new activities or hobbies, such as a physical activity or drawing. Mental health therapy, including: Cognitive behavioral therapy (CBT). This helps you learn coping skills for dealing with pain. Acceptance and commitment therapy (ACT) to change the way you think and react to pain. Relaxation therapies, including muscle relaxation exercises and mindfulness-based stress reduction. Pain management counseling. This may be individual, family, or group counseling.  Receive medical treatments Medical treatments for pain management include: Nerve block injections. These may include a pain blocker and anti-inflammatory medicines. You may have injections: Near the spine to relieve chronic back or neck pain. Into joints to relieve back or joint pain. Into nerve areas that supply a painful area to relieve body  pain. Into muscles (trigger point injections) to relieve some painful muscle conditions. A medical device placed near your spine to help block pain signals and relieve nerve pain or chronic back pain (spinal cord stimulation device). Acupuncture. Follow these instructions at home Medicines Take over-the-counter and prescription medicines only as told by your health care provider. If you are taking pain medicine, ask your health care providers about possible side effects to watch out for. Do not drive or use heavy machinery while taking prescription opioid pain medicine. Lifestyle  Do not use drugs or alcohol to reduce pain. If you drink alcohol, limit how much you have to: 0-1 drink a day for women who are not pregnant. 0-2 drinks a day for men. Know how much alcohol is in a drink. In the [place] one drink equals one 12 oz bottle of beer (355 mL), one 5 oz glass of wine (148 mL), or one 1 oz glass of hard liquor (44 mL). Do not use any products that contain nicotine or tobacco. These products include cigarettes, chewing tobacco, and vaping devices, such as e-cigarettes. If you need help quitting, ask your health care provider. Eat a healthy diet and maintain a healthy weight. Poor diet and excess weight may make pain worse. Eat foods that are high in fiber. These include fresh fruits and vegetables, whole grains, and  beans. Limit foods that are high in fat and processed sugars, such as fried and sweet foods. Exercise regularly. Exercise lowers stress and may help relieve pain. Ask your health care provider what activities and exercises are safe for you. If your health care provider approves, join an exercise class that combines movement and stress reduction. Examples include yoga and tai chi. Get enough sleep. Lack of sleep may make pain worse. Lower stress as much as possible. Practice stress reduction techniques as told by your therapist. General instructions Work with all your pain  management providers to find the treatments that work best for you. You are an important member of your pain management team. There are many things you can do to reduce pain on your own. Consider joining an online or in-person support group for people who have chronic pain. Keep all follow-up visits. This is important. Where to find more information You can find more information about managing pain without opioids from: American Academy of Pain Medicine: painmed.org Institute for Chronic Pain: instituteforchronicpain.org American Chronic Pain Association: theacpa.org Contact a health care provider if: You have side effects from pain medicine. Your pain gets worse or does not get better with treatments or home therapy. You are struggling with anxiety or depression. Summary Many types of pain can be managed without opioids. Chronic pain may respond better to pain management without opioids. Pain is best managed when you and a team of health care providers work together. Pain management without opioids may include non-opioid medicines, medical treatments, physical therapy, mental health therapy, and lifestyle changes. Tell your health care providers if your pain gets worse or is not being managed well enough. This information is not intended to replace advice given to you by your health care provider. Make sure you discuss any questions you have with your health care provider. Document Revised: 09/24/2020 Document Reviewed: 09/24/2020 Elsevier Patient Education  2024 [employer].

## 2024-01-23 NOTE — Progress Notes (Signed)
 Subjective:   Claudia [name] is a 89 y.o. who presents for a Medicare Wellness preventive visit.  As a reminder, Annual Wellness Visits don't include a physical exam, and some assessments may be limited, especially if this visit is performed virtually. We may recommend an in-person follow-up visit with your provider if needed.  Visit Complete: Virtual I connected with  Claudia [name] on 01/23/24 by a audio enabled telemedicine application and verified that I am speaking with the correct person using two identifiers.  Patient Location: Home  Provider Location: Home Office  I discussed the limitations of evaluation and management by telemedicine. The patient expressed understanding and agreed to proceed.  Vital Signs: Because this visit was a virtual/telehealth visit, some criteria may be missing or patient reported. Any vitals not documented were not able to be obtained and vitals that have been documented are patient reported.  VideoDeclined- This patient declined Librarian, academic. Therefore the visit was completed with audio only.  Persons Participating in Visit: Patient.  AWV Questionnaire: No: Patient Medicare AWV questionnaire was not completed prior to this visit.  Cardiac Risk Factors include: advanced age (>28men, >80 women);dyslipidemia;Other (see comment);hypertension, Risk factor comments: CKD stage 3b, PAF     Objective:    Today's Vitals   01/23/24 1400  Weight: 148 lb (67.1 kg)  Height: 5' 9 (1.753 m)   Body mass index is 21.86 kg/m.     01/23/2024    2:25 PM 12/22/2023    2:34 PM 11/04/2022    3:08 PM 01/25/2022   11:47 AM 12/08/2021    2:03 PM 10/19/2021    6:06 AM 10/13/2021    2:16 PM  Advanced Directives  Does Patient Have a Medical Advance Directive?  Yes Yes Yes Yes Yes Yes  Type of Advance Directive Living will Healthcare Power of [place];Living will Healthcare Power of [place];Living will Healthcare Power of  [place];Living will Living will;Healthcare Power of [employer] Power of [place];Living will Healthcare Power of [place];Living will  Does patient want to make changes to medical advance directive?     No - Patient declined    Copy of Healthcare Power of Attorney in Chart?  No - copy requested No - copy requested No - copy requested No - copy requested No - copy requested     Current Medications (verified) Outpatient Encounter Medications as of 01/23/2024  Medication Sig   cyanocobalamin  2000 MCG tablet Take 1 tablet (2,000 mcg total) by mouth daily.   HYDROcodone -acetaminophen  (NORCO/VICODIN) 5-325 MG tablet Take 1 tablet by mouth every 8 (eight) hours as needed for moderate pain (pain score 4-6).   linaclotide  (LINZESS ) 290 MCG CAPS capsule Take 1 capsule (290 mcg total) by mouth daily before breakfast.   Rivaroxaban  (XARELTO ) 15 MG TABS tablet Take 1 tablet (15 mg total) by mouth daily with supper.   torsemide  (DEMADEX ) 20 MG tablet Take 1 tablet (20 mg total) by mouth daily.   No facility-administered encounter medications on file as of 01/23/2024.    Allergies (verified) Lipitor [atorvastatin ] and Trileptal  [oxcarbazepine ]   History: Past Medical History:  Diagnosis Date   Abnormality of gait    Brachial neuritis or radiculitis NOS    Degeneration of lumbar or lumbosacral intervertebral disc    Dysrhythmia    Essential hypertension, benign    Gouty arthropathy    Lumbago    Obesity    Osteoarthritis    Pure hypercholesterolemia    Unspecified hypothyroidism    Past  Surgical History:  Procedure Laterality Date   ABDOMINAL HYSTERECTOMY  03/13/2010   ANTERIOR CERVICAL DECOMP/DISCECTOMY FUSION N/A 10/19/2021   Procedure: C3-4, C4-5, C5-6 ANTERIOR CERVICAL DISCECTOMY FUSION, ALLOGRAFT, PLATE;  Surgeon: Barbarann Oneil BROCKS, MD;  Location: [facility] OR;  Service: Orthopedics;  Laterality: N/A;   BACK SURGERY     LUMBAR LAMINECTOMY  03/13/2010   right knee replacement  03/13/2010    Family History  Problem Relation Age of Onset   Heart disease Father    Heart attack Father    Hypertension Mother    Alzheimer's disease Mother    Diabetes Sister    Cirrhosis Brother    Diabetes Sister    Diabetes Sister    Diabetes Brother    Heart attack Brother    Cancer Neg Hx    Kidney disease Neg Hx    Social History   Socioeconomic History   Marital status: Widowed    Spouse name: Not on file   Number of children: 1   Years of education: Not on file   Highest education level: Not on file  Occupational History   Occupation: RETIRED  Tobacco Use   Smoking status: Never   Smokeless tobacco: Never  Vaping Use   Vaping status: Never Used  Substance and Sexual Activity   Alcohol use: Yes    Comment: 1 glass wine/month   Drug use: No   Sexual activity: Never  Other Topics Concern   Not on file  Social History Narrative   Live alone/2025   Social Drivers of Health   Financial Resource Strain: Low Risk  (01/23/2024)   Overall Financial Resource Strain (CARDIA)    Difficulty of Paying Living Expenses: Not hard at all  Food Insecurity: No Food Insecurity (01/23/2024)   Hunger Vital Sign    Worried About Running Out of Food in the Last Year: Never true    Ran Out of Food in the Last Year: Never true  Transportation Needs: No Transportation Needs (01/23/2024)   PRAPARE - Administrator, Civil Service (Medical): No    Lack of Transportation (Non-Medical): No  Physical Activity: Insufficiently Active (01/23/2024)   Exercise Vital Sign    Days of Exercise per Week: 4 days    Minutes of Exercise per Session: 10 min  Stress: No Stress Concern Present (01/23/2024)   [employer] of Occupational Health - Occupational Stress Questionnaire    Feeling of Stress: Not at all  Social Connections: Moderately Integrated (01/23/2024)   Social Connection and Isolation Panel    Frequency of Communication with Friends and Family: Three times a week    Frequency  of Social Gatherings with Friends and Family: Once a week    Attends Religious Services: More than 4 times per year    Active Member of [employer] or Organizations: Yes    Attends Banker Meetings: More than 4 times per year    Marital Status: Widowed    Tobacco Counseling Counseling given: Not Answered    Clinical Intake:  Pre-visit preparation completed: Yes  Pain : No/denies pain     BMI - recorded: 21.86 Nutritional Status: BMI of 19-24  Normal Nutritional Risks: None Diabetes: No  Lab Results  Component Value Date   HGBA1C 5.5 11/03/2020   HGBA1C 5.8 (H) 08/24/2015   HGBA1C 5.6 08/10/2011     How often do you need to have someone help you when you read instructions, pamphlets, or other written materials from your doctor or pharmacy?:  2 - Rarely  Interpreter Needed?: No  Information entered by :: Hyder Deman, RMA   Activities of Daily Living     01/23/2024    2:01 PM  In your present state of health, do you have any difficulty performing the following activities:  Hearing? 0  Vision? 0  Difficulty concentrating or making decisions? 0  Walking or climbing stairs? 0  Dressing or bathing? 0  Doing errands, shopping? 0  Comment drives short distances  Preparing Food and eating ? N  Using the Toilet? N  In the past six months, have you accidently leaked urine? N  Do you have problems with loss of bowel control? N  Managing your Medications? N  Managing your Finances? N  Housekeeping or managing your Housekeeping? N    Patient Care Team: Joshua Debby CROME, MD as PCP - General (Internal Medicine) Raford Riggs, MD as PCP - Cardiology (Cardiology) Beuford Anes, MD as Consulting Physician (Orthopedic Surgery) Regenia Prentice Clack, MD as Consulting Physician (Ophthalmology) Fate Morna SAILOR, [facility] (Inactive) as Pharmacist (Pharmacist)  I have updated your Care Teams any recent Medical Services you may have received from other providers in  the past year.     Assessment:   This is a routine wellness examination for Claudia [name].  Hearing/Vision screen Hearing Screening - Comments:: Denies hearing difficulties   Vision Screening - Comments:: Wears eyeglasses/[facility]   Goals Addressed   None    Depression Screen     01/23/2024    2:30 PM 09/26/2023    2:27 PM 11/04/2022    3:10 PM 06/14/2022    2:51 PM 12/08/2021    2:06 PM 06/02/2021    2:14 PM 05/05/2020    1:14 PM  PHQ 2/9 Scores  PHQ - 2 Score 0 0 0 0 0 0 0  PHQ- 9 Score 0  0        Fall Risk     01/23/2024    2:26 PM 09/26/2023    2:27 PM 11/04/2022    3:10 PM 06/14/2022    2:50 PM 12/08/2021    2:04 PM  Fall Risk   Falls in the past year? 0 0 0 0 0  Number falls in past yr: 0 0 0 0 0  Injury with Fall? 0 0 0 0 0  Risk for fall due to :  No Fall Risks No Fall Risks No Fall Risks Other (Comment);Impaired mobility;Orthopedic patient  Risk for fall due to: Comment     s/p back surgery  Follow up Falls evaluation completed;Falls prevention discussed Falls evaluation completed Falls prevention discussed Falls evaluation completed  Falls evaluation completed;Falls prevention discussed      Data saved with a previous flowsheet row definition    MEDICARE RISK AT HOME:  Medicare Risk at Home Any stairs in or around the home?: No If so, are there any without handrails?: No Home free of loose throw rugs in walkways, pet beds, electrical cords, etc?: Yes Adequate lighting in your home to reduce risk of falls?: Yes Life alert?: No Use of a cane, walker or w/c?: No Grab bars in the bathroom?: Yes Shower chair or bench in shower?: Yes Elevated toilet seat or a handicapped toilet?: Yes  TIMED UP AND GO:  Was the test performed?  No  Cognitive Function: Declined/Normal: No cognitive concerns noted by patient or family. Patient alert, oriented, able to answer questions appropriately and recall recent events. No signs of memory loss or confusion.  05/13/2015   11:56 AM  MMSE - Mini Mental State Exam  Not completed: --        11/04/2022    3:10 PM 12/08/2021    2:07 PM 05/05/2020    2:13 PM  6CIT Screen  What Year? 0 points 0 points 0 points  What month? 0 points 0 points 0 points  What time? 0 points 0 points 0 points  Count back from 20 0 points 0 points 0 points  Months in reverse 0 points 0 points 0 points  Repeat phrase 0 points 0 points 0 points  Total Score 0 points 0 points 0 points    Immunizations Immunization History  Administered Date(s) Administered   DTaP 01/01/2008   Fluad Quad(high Dose 65+) 03/30/2019, 05/05/2021, 03/22/2022   Influenza Split 04/06/2012   Influenza Whole 05/04/2011   Influenza, High Dose Seasonal PF 04/17/2013, 03/04/2016, 04/19/2017, 04/07/2018   Influenza,inj,Quad PF,6+ Mos 04/11/2014, 04/14/2015   Influenza-Unspecified 04/15/2020, 04/12/2023   PFIZER(Purple Top)SARS-COV-2 Vaccination 07/30/2019, 08/25/2019, 03/15/2020, 11/19/2020   Pneumococcal Conjugate-13 04/02/2008, 10/08/2013   Pneumococcal Polysaccharide-23 09/02/2015, 11/03/2020   RSV,unspecified 03/22/2022   Tdap 01/01/2008, 08/23/2017   Unspecified SARS-COV-2 Vaccination 03/22/2022   Zoster Recombinant(Shingrix) 01/03/2017, 04/02/2017   Zoster, Live 08/03/2006    Screening Tests Health Maintenance  Topic Date Due   INFLUENZA VACCINE  01/27/2024   Medicare Annual Wellness (AWV)  01/22/2025   DTaP/Tdap/Td (4 - Td or Tdap) 08/24/2027   Pneumococcal Vaccine: 50+ Years  Completed   DEXA SCAN  Completed   Zoster Vaccines- Shingrix  Completed   Hepatitis B Vaccines  Aged Out   HPV VACCINES  Aged Out   Meningococcal B Vaccine  Aged Out   COVID-19 Vaccine  Discontinued    Health Maintenance  There are no preventive care reminders to display for this patient. Health Maintenance Items Addressed: See Nurse Notes at the end of this note  Additional Screening:  Vision Screening: Recommended annual ophthalmology exams for  early detection of glaucoma and other disorders of the eye. Would you like a referral to an eye doctor? No    Dental Screening: Recommended annual dental exams for proper oral hygiene  Community Resource Referral / Chronic Care Management: CRR required this visit?  No   CCM required this visit?  No   Plan:    I have personally reviewed and noted the following in the patient's chart:   Medical and social history Use of alcohol, tobacco or illicit drugs  Current medications and supplements including opioid prescriptions. Patient is currently taking opioid prescriptions. Information provided to patient regarding non-opioid alternatives. Patient advised to discuss non-opioid treatment plan with their provider. Functional ability and status Nutritional status Physical activity Advanced directives List of other physicians Hospitalizations, surgeries, and ER visits in previous 12 months Vitals Screenings to include cognitive, depression, and falls Referrals and appointments  In addition, I have reviewed and discussed with patient certain preventive protocols, quality metrics, and best practice recommendations. A written personalized care plan for preventive services as well as general preventive health recommendations were provided to patient.   Yordin Rhoda L Edrie Ehrich, CMA   01/23/2024   After Visit Summary: (MyChart) Due to this being a telephonic visit, the after visit summary with patients personalized plan was offered to patient via MyChart   Notes: Patient is due for a DEXA, however she would like to discuss further with Dr. Joshua during next office visit.

## 2024-02-15 ENCOUNTER — Other Ambulatory Visit: Payer: Self-pay | Admitting: Internal Medicine

## 2024-02-15 DIAGNOSIS — I10 Essential (primary) hypertension: Secondary | ICD-10-CM

## 2024-02-15 NOTE — Telephone Encounter (Signed)
 Copied from CRM #[id number]. Topic: Clinical - Medication Refill >> Feb 15, 2024 11:10 AM [place] A wrote: Medication: torsemide  (DEMADEX ) 20 MG tablet  Has the patient contacted their pharmacy? No (Agent: If no, request that the patient contact the pharmacy for the refill. If patient does not wish to contact the pharmacy document the reason why and proceed with request.) (Agent: If yes, when and what did the pharmacy advise?)  This is the patient's preferred pharmacy:  [facility] [phone] - [place], [place] - [address] AT [facility] [address] [place] [address] Phone: [phone] Fax: [phone]  Is this the correct pharmacy for this prescription? Yes If no, delete pharmacy and type the correct one.   Has the prescription been filled recently? No  Is the patient out of the medication? Yes  Has the patient been seen for an appointment in the last year OR does the patient have an upcoming appointment? Yes  Can we respond through MyChart? Yes  Agent: Please be advised that Rx refills may take up to 3 business days. We ask that you follow-up with your pharmacy.

## 2024-02-24 ENCOUNTER — Telehealth: Payer: Self-pay | Admitting: Internal Medicine

## 2024-02-24 DIAGNOSIS — M961 Postlaminectomy syndrome, not elsewhere classified: Secondary | ICD-10-CM

## 2024-02-24 DIAGNOSIS — M4802 Spinal stenosis, cervical region: Secondary | ICD-10-CM

## 2024-02-24 DIAGNOSIS — N882 Stricture and stenosis of cervix uteri: Secondary | ICD-10-CM

## 2024-02-24 DIAGNOSIS — M170 Bilateral primary osteoarthritis of knee: Secondary | ICD-10-CM

## 2024-02-24 DIAGNOSIS — M47817 Spondylosis without myelopathy or radiculopathy, lumbosacral region: Secondary | ICD-10-CM

## 2024-02-24 MED ORDER — HYDROCODONE-ACETAMINOPHEN 5-325 MG PO TABS: 1.0000 | ORAL_TABLET | Freq: Three times a day (TID) | ORAL | 0 refills | Status: DC | PRN

## 2024-02-24 NOTE — Telephone Encounter (Signed)
 Copied from CRM [id number]. Topic: Clinical - Medication Refill >> Feb 24, 2024 11:33 AM Carlyon D wrote: Medication: HYDROcodone -acetaminophen  (NORCO/VICODIN) 5-325 MG tablet  Has the patient contacted their pharmacy? Yes (Agent: If no, request that the patient contact the pharmacy for the refill. If patient does not wish to contact the pharmacy document the reason why and proceed with request.) (Agent: If yes, when and what did the pharmacy advise?)  This is the patient's preferred pharmacy:  [facility] [phone] - [place], [place] - [address] AT [facility] [address] [place] [address] Phone: [phone] Fax: [phone]  Is this the correct pharmacy for this prescription? Yes If no, delete pharmacy and type the correct one.   Has the prescription been filled recently? No  Is the patient out of the medication? Yes  Has the patient been seen for an appointment in the last year OR does the patient have an upcoming appointment? Yes  Can we respond through MyChart? No  Agent: Please be advised that Rx refills may take up to 3 business days. We ask that you follow-up with your pharmacy.

## 2024-03-28 ENCOUNTER — Other Ambulatory Visit: Payer: Self-pay | Admitting: Internal Medicine

## 2024-03-28 DIAGNOSIS — M4802 Spinal stenosis, cervical region: Secondary | ICD-10-CM

## 2024-03-28 DIAGNOSIS — M961 Postlaminectomy syndrome, not elsewhere classified: Secondary | ICD-10-CM

## 2024-03-28 DIAGNOSIS — N882 Stricture and stenosis of cervix uteri: Secondary | ICD-10-CM

## 2024-03-28 DIAGNOSIS — M47817 Spondylosis without myelopathy or radiculopathy, lumbosacral region: Secondary | ICD-10-CM

## 2024-03-28 DIAGNOSIS — M170 Bilateral primary osteoarthritis of knee: Secondary | ICD-10-CM

## 2024-03-28 MED ORDER — HYDROCODONE-ACETAMINOPHEN 5-325 MG PO TABS: 1.0000 | ORAL_TABLET | Freq: Three times a day (TID) | ORAL | 0 refills | Status: DC | PRN

## 2024-03-28 NOTE — Telephone Encounter (Signed)
 Copied from CRM #[id number]. Topic: Clinical - Medication Refill >> Mar 28, 2024 11:59 AM Drema MATSU wrote: Medication: HYDROcodone -acetaminophen  (NORCO/VICODIN) 5-325 MG tablet  Has the patient contacted their pharmacy? no (Agent: If no, request that the patient contact the pharmacy for the refill. If patient does not wish to contact the pharmacy document the reason why and proceed with request.) call provider  (Agent: If yes, when and what did the pharmacy advise?)  This is the patient's preferred pharmacy:  [facility] [phone] - [place], [place] - [address] AT [facility] [address] [place] [address] Phone: [phone] Fax: [phone]  Is this the correct pharmacy for this prescription? Yes If no, delete pharmacy and type the correct one.   Has the prescription been filled recently? Yes  Is the patient out of the medication? Yes 1 left   Has the patient been seen for an appointment in the last year OR does the patient have an upcoming appointment? Yes  Can we respond through MyChart? Yes  Agent: Please be advised that Rx refills may take up to 3 business days. We ask that you follow-up with your pharmacy.

## 2024-04-05 ENCOUNTER — Ambulatory Visit: Admitting: Internal Medicine

## 2024-04-26 ENCOUNTER — Other Ambulatory Visit: Payer: Self-pay | Admitting: Internal Medicine

## 2024-04-26 DIAGNOSIS — M961 Postlaminectomy syndrome, not elsewhere classified: Secondary | ICD-10-CM

## 2024-04-26 DIAGNOSIS — N882 Stricture and stenosis of cervix uteri: Secondary | ICD-10-CM

## 2024-04-26 DIAGNOSIS — M4802 Spinal stenosis, cervical region: Secondary | ICD-10-CM

## 2024-04-26 DIAGNOSIS — M170 Bilateral primary osteoarthritis of knee: Secondary | ICD-10-CM

## 2024-04-26 DIAGNOSIS — M47817 Spondylosis without myelopathy or radiculopathy, lumbosacral region: Secondary | ICD-10-CM

## 2024-04-26 NOTE — Telephone Encounter (Signed)
 Copied from CRM #[id number]. Topic: Clinical - Medication Refill >> Apr 26, 2024  8:43 AM Ahlexyia S wrote: Medication: HYDROcodone -acetaminophen  (NORCO/VICODIN) 5-325 MG tablet  Has the patient contacted their pharmacy? Yes, pt was told that her provider needs to send in prescription. (Agent: If no, request that the patient contact the pharmacy for the refill. If patient does not wish to contact the pharmacy document the reason why and proceed with request.) (Agent: If yes, when and what did the pharmacy advise?)  This is the patient's preferred pharmacy:  [facility] [phone] - [place], [place] - [address] AT [facility] [address] [place] [address] Phone: [phone] Fax: [phone]  Is this the correct pharmacy for this prescription? Yes If no, delete pharmacy and type the correct one.   Has the prescription been filled recently? Yes  Is the patient out of the medication? No, limited supply.  Has the patient been seen for an appointment in the last year OR does the patient have an upcoming appointment? Yes  Can we respond through MyChart? Yes  Agent: Please be advised that Rx refills may take up to 3 business days. We ask that you follow-up with your pharmacy.

## 2024-04-30 ENCOUNTER — Encounter: Payer: Self-pay | Admitting: Internal Medicine

## 2024-04-30 ENCOUNTER — Ambulatory Visit (INDEPENDENT_AMBULATORY_CARE_PROVIDER_SITE_OTHER): Admitting: Internal Medicine

## 2024-04-30 ENCOUNTER — Encounter: Payer: Self-pay | Admitting: Radiology

## 2024-04-30 VITALS — BP 138/64 | HR 62 | Temp 98.5°F | Resp 16 | Ht 69.0 in | Wt 146.8 lb

## 2024-04-30 DIAGNOSIS — M170 Bilateral primary osteoarthritis of knee: Secondary | ICD-10-CM

## 2024-04-30 DIAGNOSIS — E538 Deficiency of other specified B group vitamins: Secondary | ICD-10-CM | POA: Diagnosis not present

## 2024-04-30 DIAGNOSIS — N882 Stricture and stenosis of cervix uteri: Secondary | ICD-10-CM

## 2024-04-30 DIAGNOSIS — M47817 Spondylosis without myelopathy or radiculopathy, lumbosacral region: Secondary | ICD-10-CM

## 2024-04-30 DIAGNOSIS — K5904 Chronic idiopathic constipation: Secondary | ICD-10-CM | POA: Diagnosis not present

## 2024-04-30 DIAGNOSIS — E7800 Pure hypercholesterolemia, unspecified: Secondary | ICD-10-CM

## 2024-04-30 DIAGNOSIS — N1832 Chronic kidney disease, stage 3b: Secondary | ICD-10-CM

## 2024-04-30 DIAGNOSIS — M961 Postlaminectomy syndrome, not elsewhere classified: Secondary | ICD-10-CM

## 2024-04-30 DIAGNOSIS — M4802 Spinal stenosis, cervical region: Secondary | ICD-10-CM | POA: Diagnosis not present

## 2024-04-30 DIAGNOSIS — E039 Hypothyroidism, unspecified: Secondary | ICD-10-CM | POA: Diagnosis not present

## 2024-04-30 DIAGNOSIS — G63 Polyneuropathy in diseases classified elsewhere: Secondary | ICD-10-CM

## 2024-04-30 LAB — HEPATIC FUNCTION PANEL
ALT: 8 U/L (ref 0–35)
AST: 14 U/L (ref 0–37)
Albumin: 4.3 g/dL (ref 3.5–5.2)
Alkaline Phosphatase: 68 U/L (ref 39–117)
Bilirubin, Direct: 0.1 mg/dL (ref 0.0–0.3)
Total Bilirubin: 0.9 mg/dL (ref 0.2–1.2)
Total Protein: 6.9 g/dL (ref 6.0–8.3)

## 2024-04-30 LAB — CBC WITH DIFFERENTIAL/PLATELET
Basophils Absolute: 0.1 K/uL (ref 0.0–0.1)
Basophils Relative: 1.5 % (ref 0.0–3.0)
Eosinophils Absolute: 0.2 K/uL (ref 0.0–0.7)
Eosinophils Relative: 4.6 % (ref 0.0–5.0)
HCT: 37 % (ref 36.0–46.0)
Hemoglobin: 12.1 g/dL (ref 12.0–15.0)
Lymphocytes Relative: 37.1 % (ref 12.0–46.0)
Lymphs Abs: 1.4 K/uL (ref 0.7–4.0)
MCHC: 32.8 g/dL (ref 30.0–36.0)
MCV: 85.3 fl (ref 78.0–100.0)
Monocytes Absolute: 0.4 K/uL (ref 0.1–1.0)
Monocytes Relative: 10.5 % (ref 3.0–12.0)
Neutro Abs: 1.7 K/uL (ref 1.4–7.7)
Neutrophils Relative %: 46.3 % (ref 43.0–77.0)
Platelets: 181 K/uL (ref 150.0–400.0)
RBC: 4.34 Mil/uL (ref 3.87–5.11)
RDW: 14.5 % (ref 11.5–15.5)
WBC: 3.7 K/uL — ABNORMAL LOW (ref 4.0–10.5)

## 2024-04-30 LAB — TSH: TSH: 2.91 u[IU]/mL (ref 0.35–5.50)

## 2024-04-30 LAB — MAGNESIUM: Magnesium: 2.2 mg/dL (ref 1.5–2.5)

## 2024-04-30 LAB — BASIC METABOLIC PANEL WITH GFR
BUN: 24 mg/dL — ABNORMAL HIGH (ref 6–23)
CO2: 30 meq/L (ref 19–32)
Calcium: 9.9 mg/dL (ref 8.4–10.5)
Chloride: 105 meq/L (ref 96–112)
Creatinine, Ser: 1.15 mg/dL (ref 0.40–1.20)
GFR: 42.63 mL/min — ABNORMAL LOW (ref >60.00)
Glucose, Bld: 90 mg/dL (ref 70–99)
Potassium: 5 meq/L (ref 3.5–5.1)
Sodium: 141 meq/L (ref 135–145)

## 2024-04-30 LAB — FOLATE: Folate: 6.4 ng/mL (ref >5.9)

## 2024-04-30 LAB — VITAMIN B12: Vitamin B-12: >1500 pg/mL — ABNORMAL HIGH (ref 211–911)

## 2024-04-30 MED ORDER — LINACLOTIDE 290 MCG PO CAPS: 290.0000 ug | ORAL_CAPSULE | Freq: Every day | ORAL | 1 refills | Status: AC

## 2024-04-30 MED ORDER — HYDROCODONE-ACETAMINOPHEN 5-325 MG PO TABS: 1.0000 | ORAL_TABLET | Freq: Three times a day (TID) | ORAL | 0 refills | Status: DC | PRN

## 2024-04-30 NOTE — Patient Instructions (Signed)
 Hypertension, Adult High blood pressure (hypertension) is when the force of blood pumping through the arteries is too strong. The arteries are the blood vessels that carry blood from the heart throughout the body. Hypertension forces the heart to work harder to pump blood and may cause arteries to become narrow or stiff. Untreated or uncontrolled hypertension can lead to a heart attack, heart failure, a stroke, kidney disease, and other problems. A blood pressure reading consists of a higher number over a lower number. Ideally, your blood pressure should be below 120/80. The first ("top") number is called the systolic pressure. It is a measure of the pressure in your arteries as your heart beats. The second ("bottom") number is called the diastolic pressure. It is a measure of the pressure in your arteries as the heart relaxes. What are the causes? The exact cause of this condition is not known. There are some conditions that result in high blood pressure. What increases the risk? Certain factors may make you more likely to develop high blood pressure. Some of these risk factors are under your control, including: Smoking. Not getting enough exercise or physical activity. Being overweight. Having too much fat, sugar, calories, or salt (sodium) in your diet. Drinking too much alcohol. Other risk factors include: Having a personal history of heart disease, diabetes, high cholesterol, or kidney disease. Stress. Having a family history of high blood pressure and high cholesterol. Having obstructive sleep apnea. Age. The risk increases with age. What are the signs or symptoms? High blood pressure may not cause symptoms. Very high blood pressure (hypertensive crisis) may cause: Headache. Fast or irregular heartbeats (palpitations). Shortness of breath. Nosebleed. Nausea and vomiting. Vision changes. Severe chest pain, dizziness, and seizures. How is this diagnosed? This condition is diagnosed by  measuring your blood pressure while you are seated, with your arm resting on a flat surface, your legs uncrossed, and your feet flat on the floor. The cuff of the blood pressure monitor will be placed directly against the skin of your upper arm at the level of your heart. Blood pressure should be measured at least twice using the same arm. Certain conditions can cause a difference in blood pressure between your right and left arms. If you have a high blood pressure reading during one visit or you have normal blood pressure with other risk factors, you may be asked to: Return on a different day to have your blood pressure checked again. Monitor your blood pressure at home for 1 week or longer. If you are diagnosed with hypertension, you may have other blood or imaging tests to help your health care provider understand your overall risk for other conditions. How is this treated? This condition is treated by making healthy lifestyle changes, such as eating healthy foods, exercising more, and reducing your alcohol intake. You may be referred for counseling on a healthy diet and physical activity. Your health care provider may prescribe medicine if lifestyle changes are not enough to get your blood pressure under control and if: Your systolic blood pressure is above 130. Your diastolic blood pressure is above 80. Your personal target blood pressure may vary depending on your medical conditions, your age, and other factors. Follow these instructions at home: Eating and drinking  Eat a diet that is high in fiber and potassium, and low in sodium, added sugar, and fat. An example of this eating plan is called the DASH diet. DASH stands for Dietary Approaches to Stop Hypertension. To eat this way: Eat  plenty of fresh fruits and vegetables. Try to fill one half of your plate at each meal with fruits and vegetables. Eat whole grains, such as whole-wheat pasta, brown rice, or whole-grain bread. Fill about one  fourth of your plate with whole grains. Eat or drink low-fat dairy products, such as skim milk or low-fat yogurt. Avoid fatty cuts of meat, processed or cured meats, and poultry with skin. Fill about one fourth of your plate with lean proteins, such as fish, chicken without skin, beans, eggs, or tofu. Avoid pre-made and processed foods. These tend to be higher in sodium, added sugar, and fat. Reduce your daily sodium intake. Many people with hypertension should eat less than 1,500 mg of sodium a day. Do not drink alcohol if: Your health care provider tells you not to drink. You are pregnant, may be pregnant, or are planning to become pregnant. If you drink alcohol: Limit how much you have to: 0-1 drink a day for women. 0-2 drinks a day for men. Know how much alcohol is in your drink. In the [place] one drink equals one 12 oz bottle of beer (355 mL), one 5 oz glass of wine (148 mL), or one 1 oz glass of hard liquor (44 mL). Lifestyle  Work with your health care provider to maintain a healthy body weight or to lose weight. Ask what an ideal weight is for you. Get at least 30 minutes of exercise that causes your heart to beat faster (aerobic exercise) most days of the week. Activities may include walking, swimming, or biking. Include exercise to strengthen your muscles (resistance exercise), such as Pilates or lifting weights, as part of your weekly exercise routine. Try to do these types of exercises for 30 minutes at least 3 days a week. Do not use any products that contain nicotine or tobacco. These products include cigarettes, chewing tobacco, and vaping devices, such as e-cigarettes. If you need help quitting, ask your health care provider. Monitor your blood pressure at home as told by your health care provider. Keep all follow-up visits. This is important. Medicines Take over-the-counter and prescription medicines only as told by your health care provider. Follow directions carefully. Blood  pressure medicines must be taken as prescribed. Do not skip doses of blood pressure medicine. Doing this puts you at risk for problems and can make the medicine less effective. Ask your health care provider about side effects or reactions to medicines that you should watch for. Contact a health care provider if you: Think you are having a reaction to a medicine you are taking. Have headaches that keep coming back (recurring). Feel dizzy. Have swelling in your ankles. Have trouble with your vision. Get help right away if you: Develop a severe headache or confusion. Have unusual weakness or numbness. Feel faint. Have severe pain in your chest or abdomen. Vomit repeatedly. Have trouble breathing. These symptoms may be an emergency. Get help right away. Call 911. Do not wait to see if the symptoms will go away. Do not drive yourself to the hospital. Summary Hypertension is when the force of blood pumping through your arteries is too strong. If this condition is not controlled, it may put you at risk for serious complications. Your personal target blood pressure may vary depending on your medical conditions, your age, and other factors. For most people, a normal blood pressure is less than 120/80. Hypertension is treated with lifestyle changes, medicines, or a combination of both. Lifestyle changes include losing weight, eating a healthy,  low-sodium diet, exercising more, and limiting alcohol. This information is not intended to replace advice given to you by your health care provider. Make sure you discuss any questions you have with your health care provider. Document Revised: 04/21/2021 Document Reviewed: 04/21/2021 Elsevier Patient Education  2024 [employer].

## 2024-04-30 NOTE — Progress Notes (Signed)
 "  Subjective:  Patient ID: Claudia [name], female    DOB: 04-27-1934  Age: 89 y.o. MRN: [id number]  CC: Hypertension   HPI Claudia [name] presents for f/up ---  Discussed the use of AI scribe software for clinical note transcription with the patient, who gave verbal consent to proceed.  History of Present Illness Claudia [name] is an 89 year old female who presents with concerns about low heart rate and constipation management.  She reports no dizziness, lightheadedness, chest pain, or shortness of breath. She engages in light exercise at home using a walker without experiencing dizziness.  She manages constipation with Linzess , which is effective. She has bowel movements almost daily but sometimes goes two to three days without one, causing discomfort. Occasionally, she supplements Linzess  with milk of magnesia or chewable laxatives.  She has difficulty gaining weight despite eating freely and has been trying to gain a few pounds without success.  She received her flu shot on October 1st or 2nd and confirms having both flu and another unspecified vaccine.  She is socially active, attending church and visiting family. She does not drive and relies on her niece and sisters for transportation.     Outpatient Medications Prior to Visit  Medication Sig Dispense Refill   cyanocobalamin  2000 MCG tablet Take 1 tablet (2,000 mcg total) by mouth daily. (Patient taking differently: Take 2,000 mcg by mouth once a week.) 90 tablet 1   Rivaroxaban  (XARELTO ) 15 MG TABS tablet Take 1 tablet (15 mg total) by mouth daily with supper. 90 tablet 1   torsemide  (DEMADEX ) 20 MG tablet TAKE 1 TABLET(20 MG) BY MOUTH DAILY 90 tablet 0   linaclotide  (LINZESS ) 290 MCG CAPS capsule Take 1 capsule (290 mcg total) by mouth daily before breakfast. 90 capsule 1   HYDROcodone -acetaminophen  (NORCO/VICODIN) 5-325 MG tablet Take 1 tablet by mouth every 8 (eight) hours as needed for moderate pain (pain score  4-6). 90 tablet 0   No facility-administered medications prior to visit.    ROS Review of Systems  Constitutional:  Positive for fatigue. Negative for appetite change, chills, diaphoresis and fever.  HENT: Negative.    Eyes: Negative.   Respiratory:  Negative for cough, chest tightness, shortness of breath and wheezing.   Cardiovascular:  Negative for chest pain, palpitations and leg swelling.  Gastrointestinal:  Positive for constipation. Negative for abdominal pain, blood in stool, diarrhea, nausea and vomiting.  Endocrine: Negative.   Genitourinary:  Negative for difficulty urinating and dysuria.  Musculoskeletal:  Positive for arthralgias, back pain, gait problem and neck pain. Negative for myalgias.  Neurological:  Negative for dizziness, weakness, numbness and headaches.  Hematological:  Negative for adenopathy. Does not bruise/bleed easily.  Psychiatric/Behavioral: Negative.      Objective:  BP 138/64 (BP Location: Left Arm, Patient Position: Sitting, Cuff Size: Normal)   Pulse 62   Temp 98.5 F (36.9 C) (Oral)   Resp 16   Ht 5' 9 (1.753 m)   Wt 146 lb 12.8 oz (66.6 kg)   SpO2 97%   BMI 21.68 kg/m   BP Readings from Last 3 Encounters:  04/30/24 138/64  09/26/23 138/74  05/16/23 136/76    Wt Readings from Last 3 Encounters:  04/30/24 146 lb 12.8 oz (66.6 kg)  01/23/24 148 lb (67.1 kg)  09/26/23 148 lb (67.1 kg)    Physical Exam Vitals reviewed.  Constitutional:      Appearance: Normal appearance.  HENT:     Mouth/Throat:  Mouth: Mucous membranes are moist.  Eyes:     General: No scleral icterus.    Conjunctiva/sclera: Conjunctivae normal.  Cardiovascular:     Rate and Rhythm: Normal rate and regular rhythm.     Heart sounds: No murmur heard.    No friction rub. No gallop.  Pulmonary:     Effort: Pulmonary effort is normal.     Breath sounds: No stridor. No wheezing, rhonchi or rales.  Abdominal:     General: Abdomen is flat.     Palpations:  There is no mass.     Tenderness: There is no abdominal tenderness. There is no guarding.     Hernia: No hernia is present.  Musculoskeletal:        General: Normal range of motion.     Cervical back: Neck supple.     Right lower leg: No edema.     Left lower leg: No edema.  Lymphadenopathy:     Cervical: No cervical adenopathy.  Skin:    General: Skin is warm and dry.  Neurological:     General: No focal deficit present.     Mental Status: She is alert.  Psychiatric:        Mood and Affect: Mood normal.        Behavior: Behavior normal.     Lab Results  Component Value Date   WBC 3.7 (L) 04/30/2024   HGB 12.1 04/30/2024   HCT 37.0 04/30/2024   PLT 181.0 04/30/2024   GLUCOSE 90 04/30/2024   CHOL 148 05/05/2021   TRIG 85.0 05/05/2021   HDL 54.40 05/05/2021   LDLDIRECT 158.9 09/05/2012   LDLCALC 77 05/05/2021   ALT 8 04/30/2024   AST 14 04/30/2024   NA 141 04/30/2024   K 5.0 04/30/2024   CL 105 04/30/2024   CREATININE 1.15 04/30/2024   BUN 24 (H) 04/30/2024   CO2 30 04/30/2024   TSH 2.91 04/30/2024   INR 1.2 (H) 05/16/2023   HGBA1C 5.5 11/03/2020    DG Cervical Spine 2 or 3 views Result Date: 10/19/2021 CLINICAL DATA:  ACDF EXAM: CERVICAL SPINE - 2-3 VIEW COMPARISON:  Radiograph 07/28/2021 FINDINGS: Intraoperative images during C3-C4, C4-C5, and C5-C6 ACDF. Intact hardware without evidence of immediate complication. IMPRESSION: Intraoperative images during C3-C6 ACDF. No evidence of immediate complication. Electronically Signed   By: Jacob  Kahn M.D.   On: 10/19/2021 11:27   DG C-Arm 1-60 Min-No Report Result Date: 10/19/2021 Fluoroscopy was utilized by the requesting physician.  No radiographic interpretation.   DG C-Arm 1-60 Min-No Report Result Date: 10/19/2021 Fluoroscopy was utilized by the requesting physician.  No radiographic interpretation.   DG C-Arm 1-60 Min-No Report Result Date: 10/19/2021 Fluoroscopy was utilized by the requesting physician.  No  radiographic interpretation.    Assessment & Plan:   Acquired hypothyroidism- She is euthyroid. -     TSH; Future -     Hepatic function panel; Future  Cervical spinal stenosis -     HYDROcodone -Acetaminophen ; Take 1 tablet by mouth every 8 (eight) hours as needed for moderate pain (pain score 4-6).  Dispense: 90 tablet; Refill: 0  Cervical stricture or stenosis -     HYDROcodone -Acetaminophen ; Take 1 tablet by mouth every 8 (eight) hours as needed for moderate pain (pain score 4-6).  Dispense: 90 tablet; Refill: 0  Lumbosacral spondylosis without myelopathy -     HYDROcodone -Acetaminophen ; Take 1 tablet by mouth every 8 (eight) hours as needed for moderate pain (pain score 4-6).  Dispense:  90 tablet; Refill: 0  Primary osteoarthritis of both knees -     HYDROcodone -Acetaminophen ; Take 1 tablet by mouth every 8 (eight) hours as needed for moderate pain (pain score 4-6).  Dispense: 90 tablet; Refill: 0  Postlaminectomy syndrome, lumbar region -     HYDROcodone -Acetaminophen ; Take 1 tablet by mouth every 8 (eight) hours as needed for moderate pain (pain score 4-6).  Dispense: 90 tablet; Refill: 0  Spinal stenosis in cervical region -     HYDROcodone -Acetaminophen ; Take 1 tablet by mouth every 8 (eight) hours as needed for moderate pain (pain score 4-6).  Dispense: 90 tablet; Refill: 0  Stage 3b chronic kidney disease (HCC)- Will avoid nephrotoxic agents  -     Basic metabolic panel with GFR; Future  Chronic idiopathic constipation -     Basic metabolic panel with GFR; Future -     TSH; Future -     linaCLOtide ; Take 1 capsule (290 mcg total) by mouth daily before breakfast.  Dispense: 90 capsule; Refill: 1 -     Magnesium; Future  Vitamin B12 deficiency neuropathy -     Vitamin B12; Future -     CBC with Differential/Platelet; Future -     Folate; Future  Pure hypercholesterolemia -     Hepatic function panel; Future     Follow-up: Return in about 6 months (around  10/28/2024).  Debby Molt, MD "

## 2024-05-01 ENCOUNTER — Other Ambulatory Visit: Payer: Self-pay | Admitting: Internal Medicine

## 2024-05-01 ENCOUNTER — Telehealth: Payer: Self-pay

## 2024-05-01 DIAGNOSIS — I480 Paroxysmal atrial fibrillation: Secondary | ICD-10-CM

## 2024-05-01 NOTE — Telephone Encounter (Signed)
 Copied from CRM #[id number]. Topic: Clinical - Lab/Test Results >> May 01, 2024  2:27 PM Brittany M wrote: Reason for CRM: Patient calling to have a nurse go over lab results from 11/03. She read them online and has some concerns

## 2024-05-03 NOTE — Telephone Encounter (Signed)
 I advised the patient that at this current time I can not go over her labs with her. We would have to wait for Dr. Joshua to review and make comments on them and then we can discuss them. She gave a verbal understanding.

## 2024-05-04 ENCOUNTER — Telehealth (HOSPITAL_BASED_OUTPATIENT_CLINIC_OR_DEPARTMENT_OTHER): Payer: Self-pay | Admitting: Cardiovascular Disease

## 2024-05-04 ENCOUNTER — Telehealth: Payer: Self-pay | Admitting: Cardiovascular Disease

## 2024-05-04 DIAGNOSIS — I480 Paroxysmal atrial fibrillation: Secondary | ICD-10-CM

## 2024-05-04 MED ORDER — RIVAROXABAN 15 MG PO TABS: 15.0000 mg | ORAL_TABLET | Freq: Every day | ORAL | 1 refills | Status: AC

## 2024-05-04 NOTE — Telephone Encounter (Signed)
*  STAT* If patient is at the pharmacy, call can be transferred to refill team.   1. Which medications need to be refilled? (please list name of each medication and dose if known)   Rivaroxaban  (XARELTO ) 15 MG TABS tablet   2. Would you like to learn more about the convenience, safety, & potential cost savings by using the [facility]?   3. Are you open to using the [facility] (Type [facility]. ).  4. Which pharmacy/location (including street and city if local pharmacy) is medication to be sent to?  [facility] [phone] - [place], [place] - [address]   5. Do they need a 30 day or 90 day supply?   90 day  Patient stated she has 1 tablet left.  Patient has appointment scheduled with Dr. Raford on 3/2.

## 2024-05-04 NOTE — Telephone Encounter (Signed)
*  STAT* If patient is at the pharmacy, call can be transferred to refill team.   1. Which medications need to be refilled? (please list name of each medication and dose if known) Rivaroxaban  (XARELTO ) 15 MG TABS tablet    2. Would you like to learn more about the convenience, safety, & potential cost savings by using the [facility]? no   3. Are you open to using the [facility] (Type [facility]. ). no   4. Which pharmacy/location (including street and city if local pharmacy) is medication to be sent to?[facility] [phone] - [place], [place] - [address]    5. Do they need a 30 day or 90 day supply? 90 day   Patient has appointment scheduled with Dr. Raford on 3/2  Pt is out of medication

## 2024-05-05 ENCOUNTER — Ambulatory Visit: Payer: Self-pay | Admitting: Internal Medicine

## 2024-05-07 NOTE — Telephone Encounter (Signed)
 Refill was sent 05/04/24.

## 2024-05-30 ENCOUNTER — Telehealth: Payer: Self-pay | Admitting: Internal Medicine

## 2024-05-30 DIAGNOSIS — M961 Postlaminectomy syndrome, not elsewhere classified: Secondary | ICD-10-CM

## 2024-05-30 DIAGNOSIS — N882 Stricture and stenosis of cervix uteri: Secondary | ICD-10-CM

## 2024-05-30 DIAGNOSIS — M47817 Spondylosis without myelopathy or radiculopathy, lumbosacral region: Secondary | ICD-10-CM

## 2024-05-30 DIAGNOSIS — M4802 Spinal stenosis, cervical region: Secondary | ICD-10-CM

## 2024-05-30 DIAGNOSIS — M170 Bilateral primary osteoarthritis of knee: Secondary | ICD-10-CM

## 2024-05-30 NOTE — Telephone Encounter (Signed)
 Copied from CRM #[id number]. Topic: Clinical - Medication Refill >> May 30, 2024  9:04 AM Aleatha C wrote: Medication: HYDROcodone -acetaminophen  (NORCO/VICODIN) 5-325 MG tablet  Has the patient contacted their pharmacy? Yes (Agent: If no, request that the patient contact the pharmacy for the refill. If patient does not wish to contact the pharmacy document the reason why and proceed with request.) (Agent: If yes, when and what did the pharmacy advise?)  This is the patient's preferred pharmacy:  [facility] [phone] - [place], [place] - [address] AT [facility] [address]  [place] [address] Phone: [phone] Fax: [phone]  Is this the correct pharmacy for this prescription? Yes If no, delete pharmacy and type the correct one.   Has the prescription been filled recently? No  Is the patient out of the medication? No 2 left  Has the patient been seen for an appointment in the last year OR does the patient have an upcoming appointment? Yes  Can we respond through MyChart? No  Agent: Please be advised that Rx refills may take up to 3 business days. We ask that you follow-up with your pharmacy.

## 2024-05-31 MED ORDER — HYDROCODONE-ACETAMINOPHEN 5-325 MG PO TABS: 1.0000 | ORAL_TABLET | Freq: Three times a day (TID) | ORAL | 0 refills | Status: DC | PRN

## 2024-06-25 ENCOUNTER — Other Ambulatory Visit: Payer: Self-pay | Admitting: Internal Medicine

## 2024-06-25 DIAGNOSIS — M47817 Spondylosis without myelopathy or radiculopathy, lumbosacral region: Secondary | ICD-10-CM

## 2024-06-25 DIAGNOSIS — N882 Stricture and stenosis of cervix uteri: Secondary | ICD-10-CM

## 2024-06-25 DIAGNOSIS — I10 Essential (primary) hypertension: Secondary | ICD-10-CM

## 2024-06-25 DIAGNOSIS — M4802 Spinal stenosis, cervical region: Secondary | ICD-10-CM

## 2024-06-25 DIAGNOSIS — M170 Bilateral primary osteoarthritis of knee: Secondary | ICD-10-CM

## 2024-06-25 DIAGNOSIS — M961 Postlaminectomy syndrome, not elsewhere classified: Secondary | ICD-10-CM

## 2024-06-27 MED ORDER — HYDROCODONE-ACETAMINOPHEN 5-325 MG PO TABS: 1.0000 | ORAL_TABLET | Freq: Three times a day (TID) | ORAL | 0 refills | Status: DC | PRN

## 2024-07-03 ENCOUNTER — Telehealth: Payer: Self-pay

## 2024-07-03 NOTE — Telephone Encounter (Signed)
 Copied from CRM #[id number]. Topic: Clinical - Prescription Issue >> Jul 03, 2024  8:58 AM Rea ORN wrote: Reason for CRM: pt stated she was told by pharmacy that they do not have her HYDROcodone -acetaminophen  (NORCO/VICODIN) 5-325 MG tablet. I advise that rx was sent and confirmed received by the pharmacy on 06/27/24. Pt stated she called the pharmacy this past Saturday and they said they didn't have it.  Please call back to advise, [phone]

## 2024-07-03 NOTE — Telephone Encounter (Signed)
Patient has her medication

## 2024-07-26 ENCOUNTER — Other Ambulatory Visit: Payer: Self-pay | Admitting: Internal Medicine

## 2024-07-26 DIAGNOSIS — M47817 Spondylosis without myelopathy or radiculopathy, lumbosacral region: Secondary | ICD-10-CM

## 2024-07-26 DIAGNOSIS — M961 Postlaminectomy syndrome, not elsewhere classified: Secondary | ICD-10-CM

## 2024-07-26 DIAGNOSIS — M4802 Spinal stenosis, cervical region: Secondary | ICD-10-CM

## 2024-07-26 DIAGNOSIS — N882 Stricture and stenosis of cervix uteri: Secondary | ICD-10-CM

## 2024-07-26 DIAGNOSIS — M170 Bilateral primary osteoarthritis of knee: Secondary | ICD-10-CM

## 2024-07-26 MED ORDER — HYDROCODONE-ACETAMINOPHEN 5-325 MG PO TABS: 1.0000 | ORAL_TABLET | Freq: Three times a day (TID) | ORAL | 0 refills | Status: AC | PRN

## 2024-07-26 NOTE — Telephone Encounter (Signed)
 Copied from CRM [id number]. Topic: Clinical - Medication Refill >> Jul 26, 2024  2:52 PM Tysheama G wrote: Medication: HYDROcodone -acetaminophen  (NORCO/VICODIN) 5-325 MG tablet  Has the patient contacted their pharmacy? Yes (Agent: If no, request that the patient contact the pharmacy for the refill. If patient does not wish to contact the pharmacy document the reason why and proceed with request.) (Agent: If yes, when and what did the pharmacy advise?)  This is the patient's preferred pharmacy:  [facility] [phone] - [place], [place] - [address] AT [facility] [address] [place] [address] Phone: [phone] Fax: [phone]  Is this the correct pharmacy for this prescription? Yes If no, delete pharmacy and type the correct one.   Has the prescription been filled recently? No  Is the patient out of the medication? Yes  Has the patient been seen for an appointment in the last year OR does the patient have an upcoming appointment? Yes  Can we respond through MyChart? Yes  Agent: Please be advised that Rx refills may take up to 3 business days. We ask that you follow-up with your pharmacy.

## 2024-08-27 ENCOUNTER — Ambulatory Visit (HOSPITAL_BASED_OUTPATIENT_CLINIC_OR_DEPARTMENT_OTHER): Admitting: Cardiovascular Disease

## 2025-01-23 ENCOUNTER — Ambulatory Visit
